# Patient Record
Sex: Male | Born: 1975 | Race: Black or African American | Hispanic: No | Marital: Single | Smoking: Never smoker
Health system: Southern US, Community
[De-identification: ages and names within clinical notes are randomized; demographics above are authoritative.]

## PROBLEM LIST (undated history)

## (undated) DIAGNOSIS — I509 Heart failure, unspecified: Secondary | ICD-10-CM

## (undated) DIAGNOSIS — I10 Essential (primary) hypertension: Secondary | ICD-10-CM

## (undated) DIAGNOSIS — E1121 Type 2 diabetes mellitus with diabetic nephropathy: Secondary | ICD-10-CM

## (undated) DIAGNOSIS — L870 Keratosis follicularis et parafollicularis in cutem penetrans: Secondary | ICD-10-CM

## (undated) DIAGNOSIS — N289 Disorder of kidney and ureter, unspecified: Secondary | ICD-10-CM

## (undated) DIAGNOSIS — K219 Gastro-esophageal reflux disease without esophagitis: Secondary | ICD-10-CM

## (undated) DIAGNOSIS — Z992 Dependence on renal dialysis: Secondary | ICD-10-CM

## (undated) DIAGNOSIS — E119 Type 2 diabetes mellitus without complications: Secondary | ICD-10-CM

## (undated) DIAGNOSIS — E11319 Type 2 diabetes mellitus with unspecified diabetic retinopathy without macular edema: Secondary | ICD-10-CM

## (undated) DIAGNOSIS — I2510 Atherosclerotic heart disease of native coronary artery without angina pectoris: Secondary | ICD-10-CM

## (undated) DIAGNOSIS — N2581 Secondary hyperparathyroidism of renal origin: Secondary | ICD-10-CM

## (undated) DIAGNOSIS — IMO0001 Reserved for inherently not codable concepts without codable children: Secondary | ICD-10-CM

## (undated) HISTORY — PX: EYE SURGERY: SHX253

---

## 2014-02-22 ENCOUNTER — Inpatient Hospital Stay: Payer: Self-pay | Admitting: Internal Medicine

## 2014-02-22 LAB — URINALYSIS, COMPLETE
BACTERIA: NONE SEEN
Bilirubin,UR: NEGATIVE
Blood: NEGATIVE
Ketone: NEGATIVE
LEUKOCYTE ESTERASE: NEGATIVE
Nitrite: NEGATIVE
PH: 8 (ref 4.5–8.0)
RBC,UR: 7 /HPF (ref 0–5)
SPECIFIC GRAVITY: 1.012 (ref 1.003–1.030)
SQUAMOUS EPITHELIAL: NONE SEEN
WBC UR: 4 /HPF (ref 0–5)

## 2014-02-22 LAB — TROPONIN I
TROPONIN-I: 0.04 ng/mL
Troponin-I: 0.22 ng/mL — ABNORMAL HIGH

## 2014-02-22 LAB — PROTIME-INR
INR: 1
Prothrombin Time: 13.4 secs (ref 11.5–14.7)

## 2014-02-22 LAB — APTT: ACTIVATED PTT: 40.2 s — AB (ref 23.6–35.9)

## 2014-02-22 LAB — CBC
HCT: 33.8 % — ABNORMAL LOW (ref 40.0–52.0)
HGB: 10.7 g/dL — ABNORMAL LOW (ref 13.0–18.0)
MCH: 27.6 pg (ref 26.0–34.0)
MCHC: 31.8 g/dL — ABNORMAL LOW (ref 32.0–36.0)
MCV: 87 fL (ref 80–100)
PLATELETS: 144 10*3/uL — AB (ref 150–440)
RBC: 3.89 10*6/uL — ABNORMAL LOW (ref 4.40–5.90)
RDW: 15.9 % — ABNORMAL HIGH (ref 11.5–14.5)
WBC: 7.9 10*3/uL (ref 3.8–10.6)

## 2014-02-22 LAB — COMPREHENSIVE METABOLIC PANEL
Albumin: 3.2 g/dL — ABNORMAL LOW (ref 3.4–5.0)
Alkaline Phosphatase: 73 U/L
Anion Gap: 3 — ABNORMAL LOW (ref 7–16)
BUN: 12 mg/dL (ref 7–18)
Bilirubin,Total: 0.8 mg/dL (ref 0.2–1.0)
CHLORIDE: 100 mmol/L (ref 98–107)
CREATININE: 7.49 mg/dL — AB (ref 0.60–1.30)
Calcium, Total: 7.4 mg/dL — ABNORMAL LOW (ref 8.5–10.1)
Co2: 32 mmol/L (ref 21–32)
EGFR (African American): 10 — ABNORMAL LOW
GFR CALC NON AF AMER: 9 — AB
Glucose: 189 mg/dL — ABNORMAL HIGH (ref 65–99)
OSMOLALITY: 275 (ref 275–301)
POTASSIUM: 2.9 mmol/L — AB (ref 3.5–5.1)
SGOT(AST): 27 U/L (ref 15–37)
SGPT (ALT): 17 U/L
Sodium: 135 mmol/L — ABNORMAL LOW (ref 136–145)
Total Protein: 7.3 g/dL (ref 6.4–8.2)

## 2014-02-22 LAB — DRUG SCREEN, URINE
Amphetamines, Ur Screen: NEGATIVE (ref ?–1000)
BENZODIAZEPINE, UR SCRN: NEGATIVE (ref ?–200)
Barbiturates, Ur Screen: NEGATIVE (ref ?–200)
CANNABINOID 50 NG, UR ~~LOC~~: NEGATIVE (ref ?–50)
COCAINE METABOLITE, UR ~~LOC~~: NEGATIVE (ref ?–300)
MDMA (ECSTASY) UR SCREEN: NEGATIVE (ref ?–500)
Methadone, Ur Screen: NEGATIVE (ref ?–300)
Opiate, Ur Screen: NEGATIVE (ref ?–300)
Phencyclidine (PCP) Ur S: NEGATIVE (ref ?–25)
TRICYCLIC, UR SCREEN: NEGATIVE (ref ?–1000)

## 2014-02-22 LAB — MAGNESIUM: MAGNESIUM: 1.8 mg/dL

## 2014-02-22 LAB — LIPASE, BLOOD: LIPASE: 25 U/L — AB (ref 73–393)

## 2014-02-23 ENCOUNTER — Ambulatory Visit: Payer: Self-pay | Admitting: Neurology

## 2014-02-23 LAB — CBC WITH DIFFERENTIAL/PLATELET
Basophil #: 0 10*3/uL (ref 0.0–0.1)
Basophil %: 0.2 %
EOS ABS: 0 10*3/uL (ref 0.0–0.7)
Eosinophil %: 0.1 %
HCT: 34.7 % — ABNORMAL LOW (ref 40.0–52.0)
HGB: 10.9 g/dL — AB (ref 13.0–18.0)
LYMPHS ABS: 0.4 10*3/uL — AB (ref 1.0–3.6)
Lymphocyte %: 5.7 %
MCH: 27.9 pg (ref 26.0–34.0)
MCHC: 31.6 g/dL — AB (ref 32.0–36.0)
MCV: 89 fL (ref 80–100)
MONOS PCT: 1.2 %
Monocyte #: 0.1 x10 3/mm — ABNORMAL LOW (ref 0.2–1.0)
Neutrophil #: 6.1 10*3/uL (ref 1.4–6.5)
Neutrophil %: 92.8 %
Platelet: 160 10*3/uL (ref 150–440)
RBC: 3.91 10*6/uL — AB (ref 4.40–5.90)
RDW: 16.1 % — ABNORMAL HIGH (ref 11.5–14.5)
WBC: 6.6 10*3/uL (ref 3.8–10.6)

## 2014-02-23 LAB — LIPID PANEL
Cholesterol: 93 mg/dL (ref 0–200)
HDL: 37 mg/dL — AB (ref 40–60)
Ldl Cholesterol, Calc: 41 mg/dL (ref 0–100)
Triglycerides: 73 mg/dL (ref 0–200)
VLDL Cholesterol, Calc: 15 mg/dL (ref 5–40)

## 2014-02-23 LAB — BASIC METABOLIC PANEL
Anion Gap: 9 (ref 7–16)
BUN: 23 mg/dL — AB (ref 7–18)
CO2: 28 mmol/L (ref 21–32)
Calcium, Total: 7.6 mg/dL — ABNORMAL LOW (ref 8.5–10.1)
Chloride: 94 mmol/L — ABNORMAL LOW (ref 98–107)
Creatinine: 9.55 mg/dL — ABNORMAL HIGH (ref 0.60–1.30)
EGFR (Non-African Amer.): 7 — ABNORMAL LOW
GFR CALC AF AMER: 8 — AB
Glucose: 590 mg/dL (ref 65–99)
Osmolality: 294 (ref 275–301)
POTASSIUM: 4.1 mmol/L (ref 3.5–5.1)
SODIUM: 131 mmol/L — AB (ref 136–145)

## 2014-02-23 LAB — TROPONIN I: TROPONIN-I: 0.2 ng/mL — AB

## 2014-02-23 LAB — CLOSTRIDIUM DIFFICILE(ARMC)

## 2014-02-24 LAB — BASIC METABOLIC PANEL
Anion Gap: 10 (ref 7–16)
BUN: 38 mg/dL — AB (ref 7–18)
CHLORIDE: 97 mmol/L — AB (ref 98–107)
Calcium, Total: 7.9 mg/dL — ABNORMAL LOW (ref 8.5–10.1)
Co2: 31 mmol/L (ref 21–32)
Creatinine: 12.04 mg/dL — ABNORMAL HIGH (ref 0.60–1.30)
EGFR (African American): 6 — ABNORMAL LOW
GFR CALC NON AF AMER: 5 — AB
Glucose: 231 mg/dL — ABNORMAL HIGH (ref 65–99)
Osmolality: 292 (ref 275–301)
Potassium: 3.5 mmol/L (ref 3.5–5.1)
SODIUM: 138 mmol/L (ref 136–145)

## 2014-02-24 LAB — WBCS, STOOL

## 2014-02-24 LAB — PHOSPHORUS: Phosphorus: 4.4 mg/dL (ref 2.5–4.9)

## 2014-02-25 LAB — STOOL CULTURE

## 2014-08-19 NOTE — Consult Note (Signed)
PATIENT NAME:  [name] [name], [name] [name] MR#:  [id number] DATE OF BIRTH:  July 20, 1975  DATE OF CONSULTATION:  02/23/2014  REFERRING PHYSICIAN:  Demetrios Loll, MD CONSULTING PHYSICIAN:  A. Lavone Orn, MD  CHIEF COMPLAINT: Uncontrolled diabetes.   HISTORY OF PRESENT ILLNESS: This is a 39 year old male with diabetes mellitus, hypertension, and end-stage renal disease on dialysis, admitted yesterday with chest pain and possible seizure. He was brought to the ED from his dialysis center and initially found to have hypertension with persistent chest pain, BP was initially in the 190/110 range. EKG showed no evidence of acute ST-T wave changes. Initial cardiac enzymes were negative. Nitroglycerin patch was placed and he was started on nicardipine drip for his hypertension. Initial glucose was 189. The patient was given Solu-Medrol 40 mg last evening and then developed severe hyperglycemia with blood sugars into the 300-500 range overnight. He has been treated with multiple injections of NovoLog, Levemir 6 units, as well as this morning was given NovoLog 70/30 mix 30 units, and blood sugars have gradually improved into the to 270 range.   He has had diabetes since 2001. Recalls he was taking oral medications initially and then switched to insulin about 7 years ago. He has been on his current regimen of NovoLog 70/30 mix 30-35 units at supper daily for the last 2 years. He recently relocated to the area from [place]. He has not yet established with a primary care physician. He does not check his blood sugars. He states he does have a glucometer and he has test strips, but simply dislikes the pain of fingerstick blood sugar tests. Diabetes is complicated by retinopathy with history of laser eye surgery as well as peripheral neuropathy in addition to the renal failure.   PAST MEDICAL HISTORY:  1.  Hypertension.  2.  End-stage renal disease, on dialysis.  3.  Diabetes mellitus.  4.  Diabetic retinopathy, status post laser eye  surgery.  5.  Diabetic peripheral neuropathy.   PAST SURGICAL HISTORY:  1.  Left arm fistula.  2.  Laser eye surgery.   ALLERGIES: LISINOPRIL.   SOCIAL HISTORY: The patient relocated from [place] in August 2015. Lives with his mother. Not employed. No tobacco or alcohol use.   FAMILY HISTORY: Positive for diabetes, hypertension and end-stage renal disease.   CURRENT MEDICATIONS:  1.  NovoLog insulin sliding scale.  2.  NovoLog 70/30 mix 30 units q.a.m.  3.  Norvasc 10 mg daily.  4.  Coreg 25 mg b.i.d.  5.  Lasix 40 mg daily.  6.  Hydralazine 25 mg q. 8 hours.  7.  Isor-Bid 60 mg daily.  8.  Cozaar 100 mg daily.  9.  Magnesium oxide 400 mg b.i.d.  10.  Aspirin 81 mg daily.   REVIEW OF SYSTEMS: GENERAL: Denies recent weight loss or weight gain. Denies fevers.  HEENT: Denies blurred vision. Denies sore throat.  CARDIOVASCULAR: Reports chest pain has resolved. Denies palpitations.  PULMONARY: Denies cough or shortness of breath.  ABDOMEN: Denies abdominal pain. Complains of abdominal distention, which he attributes to fluid. Denies nausea. Appetite has been good.  GENITOURINARY: Denies dysuria.  MUSCULOSKELETAL: Denies weakness. Denies swelling.  SKIN: Denies rash or recent skin changes.  ENDOCRINE: Denies heat or cold intolerance.  NEUROLOGIC: Denies falls, may have had a seizure yesterday.  HEMATOLOGIC: No easy bruising or bleeding.   PHYSICAL EXAMINATION:  VITAL SIGNS: Height 72.9 inches, weight 271 pounds, BMI 35.8. Temperature 98.1, pulse 89, respirations 18, blood pressure 162/101, pulse oximetry 96%  on room air.  GENERAL: This is an obese African American male in no acute distress.  HEENT: EOMI. Oropharynx is clear. Mucous membranes moist.  NECK: Supple. No thyromegaly.  CARDIAC: Regular rate and rhythm without murmur.  PULMONARY: Clear to auscultation bilaterally.  ABDOMEN: Distended, nontender.  EXTREMITIES: No peripheral edema is present.  SKIN: No rash or recent  skin changes. A bilateral barefoot examination shows no calluses or lesions. There are hyperpigmented striae, fine, on the left midabdomen.  NEUROLOGIC: Cranial nerves II-XII intact. No tremor.  PSYCHIATRIC: Calm, cooperative, alert.   LABORATORY DATA: Glucose 590, BUN 23, creatinine 9.5, sodium 131, chloride 94, EGFR 8, calcium 7.6. LDL cholesterol 41, total cholesterol 93, triglycerides 73. Hematocrit 34.7, WBC 6.6, platelets 160,000. Urine drug screen negative.   ASSESSMENT: A 39 year old male with multiple medical problems to include end-stage renal disease on dialysis, uncontrolled hypertension, uncontrolled diabetes, admitted after possible seizure, details not clear.   RECOMMENDATIONS:  1.  As he is most comfortable with ongoing use of 70/30 insulin, this is my insulin of choice. Typically, this insulin is dosed just prior to meals, twice daily. Can start with 10 units at breakfast meal and continue 30 units at supper meal. Expect blood sugars to improve as effects of high-dose steroid has worn off.  2.  Continue NovoLog insulin sliding scale.  3.  Advised the patient of the importance of self-monitoring of blood sugars. Advised him to check blood sugars before meals.  4.  Anticipate we will schedule outpatient followup in the next few weeks, once clinically improved and ready for discharge.   Thank you for the kind request for consultation. I will follow along with you.    ____________________________ A. Lavone Orn, MD ams:TT D: 02/23/2014 18:06:21 ET T: 02/23/2014 21:52:53 ET JOB#: [id number]  cc: A. Lavone Orn, MD, <Dictator> Sherlon Handing MD ELECTRONICALLY SIGNED 03/01/2014 13:06

## 2014-08-19 NOTE — Discharge Summary (Signed)
PATIENT NAME:  [name] [name], [name] [name] MR#:  [id number] DATE OF BIRTH:  [birth year]  DATE OF ADMISSION:  02/22/2014 DATE OF DISCHARGE:  02/24/2014  PRIMARY CARE PHYSICIAN: Non local.  CONSULTATIONS: Nephrology, Tama High, MD. Neurology, [name], MD.  Endocrinology, A. Lavone Orn, MD. Cardiology, Isaias Cowman, MD.   DISCHARGE DIAGNOSES:  1.  Malignant hypertension.  2.  Chest pain with elevated troponin, possibly due to demand ischemia.  3.  Diabetes with hypercholesterolemia.  4.  End-stage renal disease.   CONDITION: Stable.   CODE STATUS: Full Code.   HOME MEDICATIONS: Please refer to the medication reconciliation list.   DIET: Low sodium, low fat, low cholesterol, ADA diet.   ACTIVITY: As tolerated.   FOLLOWUP CARE: Follow up with PCP, Dr. Gabriel Carina, and Dr. Holley Raring within 1-2 weeks.   REASON FOR ADMISSION: Black out.   HOSPITAL COURSE: The patient is a 39 year old African American male with a history of ESRD and hypertension who developed dizziness, could not breathe, and had chest pain. He was yelling and screaming. His vision was in and out. He could not open his eyes, and then his vision came back.   For detailed history and physical examination, please refer to the admission note dictated by Dr. Loletha Grayer. The patient's blood pressure was high at 191/110 in the ED. The ED physician started a nicardipine drip.   DIAGNOSTIC DATA: CAT scan of the head was normal. Chest x-ray may reflect pulmonary venous hypertension. No cardiomyopathy.   BUN 12, creatinine 7.49; electrolytes were normal except potassium 2.9. WBC 7.9, hemoglobin 10.7, lipase 25.   ASSESSMENT AND PLAN:  1.  Malignant hypertension. The patient was treated with a nicardipine drip and was admitted to the CCU. The patient's blood pressure has been improved. The nicardipine drip was discontinued due to better controlled hypertension. The patient's home p.o. hypertension medications were resumed. In  addition, Imdur and hydralazine were added.  2.  Hypokalemia, improved.  3.  Questionable seizure, according to Dr. Irish Elders, and questionable seizure episode after dialysis. According to EEG, no seizure activity. Per Dr, Irish Elders, the patient does not need any further workup or seizure medications.  4.  Chest pain with elevated troponin, possibly due to demand ischemia, secondary to malignant hypertension. Dr. Saralyn Pilar evaluated the patient and suggested no further workup.  5.  Diabetes with hyperglycemia. The patient's blood sugar was once more than 500 yesterday. We requested an endocrinology consult; Dr. Elisabeth Cara suggested changing the patient's Humulin 70/30 of 30 units at bedtime to 30 units a.c. breakfast and 10 units a.c. supper. The patient needs to follow up with Dr. Elisabeth Cara as an outpatient.  6.  End-stage renal disease, the patient has been treated with hemodialysis. The patient has no complaints.   Vital signs are stable. He is clinically stable. He will be discharged to home today. I discussed the patient's discharge plan with the patient and nurse.   TIME SPENT: About 38 minutes.    ____________________________ Demetrios Loll, MD qc:MT D: 02/24/2014 14:28:06 ET T: 02/24/2014 16:12:10 ET JOB#: [id number]  cc: Demetrios Loll, MD, <Dictator> Demetrios Loll MD ELECTRONICALLY SIGNED 02/24/2014 17:38

## 2014-08-19 NOTE — Consult Note (Signed)
PATIENT NAME:  [name] [name], [name] [name] MR#:  [id number] DATE OF BIRTH:  1975/07/18  DATE OF CONSULTATION:  02/23/2014  REFERRING PHYSICIAN:  Demetrios Loll, MD CONSULTING PHYSICIAN:  Isaias Cowman, MD  PRIMARY CARE: [facility].  CHIEF COMPLAINT: " I don't know what happened."   HISTORY OF PRESENT ILLNESS: The patient is a 39 year old gentleman with known end-stage renal disease, on chronic hemodialysis, who was admitted on 02/22/2014 after an apparent syncopal-type episode. During dialysis the patient developed dizziness, shortness of breath, chest discomfort, apparently was yelling and screaming and had a syncopal episode. The patient was brought to [facility] Room where he was noted to be markedly hypertensive with a blood pressure of 191/110. EKG was nondiagnostic. The patient was admitted to the CCU with admission laboratories notable for borderline elevated troponin of 0.22 with a followup of 0.20. Today, the patient denies chest pain, blood pressure appears improved, currently 160/100.   PAST MEDICAL HISTORY:  1.  End-stage renal disease, on chronic hemodialysis for less than a year.  2.  Hypertension.  3.  Insulin-requiring diabetes.   MEDICATIONS: Amlodipine 5 mg daily, carvedilol 25 mg b.i.d., furosemide 40 mg b.i.d., losartan 100 mg daily, Humulin 70/30 thirty units at bedtime, Renvela 800 mg t.i.d.   SOCIAL HISTORY: The patient currently lives with his mother. He denies tobacco abuse.   FAMILY HISTORY: No immediate family history of coronary artery disease or myocardial infarction.   REVIEW OF SYSTEMS: CONSTITUTIONAL: No fever or chills.  EYES: No blurry vision.  EARS: No hearing loss.  RESPIRATORY: Shortness of breath as described above.  CARDIOVASCULAR: Mild chest discomfort as described above.  GASTROINTESTINAL: No nausea, vomiting or diarrhea.  GENITOURINARY: No dysuria or hematuria.  ENDOCRINE: No polyuria or polydipsia.  MUSCULOSKELETAL: No arthralgias or myalgias.   NEUROLOGICAL: The patient had an apparent episode yesterday, which the patient has no recollection, which comprised of apparent yelling and screaming with near syncope.   PHYSICAL EXAMINATION:  VITAL SIGNS: Blood pressure 162/101, pulse 89, respirations 18, temperature 98.1, pulse oximetry 96%.  HEENT: Pupils equal and reactive to light and accommodation.  NECK: Supple without thyromegaly.  LUNGS: Clear.  HEART: Normal JVP. Normal PMI. Regular rate and rhythm. Normal S1, S2. No appreciable gallop, murmur or rub.  ABDOMEN: Soft and nontender. Pulses were intact bilaterally.  MUSCULOSKELETAL: Normal muscle tone.  NEUROLOGIC: The patient is alert and oriented x 3. Motor and sensory both grossly intact.   IMPRESSION: A 39 year old gentleman with known end-stage renal disease, on chronic hemodialysis, who presents with markedly elevated blood pressure following an atypical syncopal episode, questionable seizure activity with nondiagnostic EKG and borderline elevated troponin, likely due to demand supply ischemia.   RECOMMENDATIONS:  1.  Agree with overall current therapy.  2.  Would defer full dose anticoagulation at this time.  3.  Review 2D echocardiogram.  4.  Would defer invasive cardiac evaluation such as cardiac catheterization.  5.  Further recommendations pending echocardiogram results.  6.  Convert nitroglycerin paste to Imdur.   ____________________________ Isaias Cowman, MD ap:TT D: 02/23/2014 16:39:50 ET T: 02/23/2014 21:25:16 ET JOB#: [id number]  cc: Isaias Cowman, MD, <Dictator> Isaias Cowman MD ELECTRONICALLY SIGNED 03/10/2014 8:39

## 2014-08-19 NOTE — H&P (Signed)
PATIENT NAME:  [name] [name], [name] [name] MR#:  [id number] DATE OF BIRTH:  10-Mar-1976  DATE OF ADMISSION:  02/22/2014  PRIMARY CARE PHYSICIAN:  Currently as [facility].   CHIEF COMPLAINT: Blacked out.   HISTORY OF PRESENT ILLNESS: This is a 39 year old man who was at dialysis, he developed dizziness. He could not breathe. He developed chest pain. He was yelling and screaming. He tried to scoot back in the chair. He blacked out. His vision was in and out. He could not open his eyes; then his vision came back. He could not hear the people talking but they were around him, but he could see them, but he could not hear them. He was told he had a seizure. No tongue bite or urination loss. The patient states that he has been having diarrhea for years, 3 episodes of diarrhea today, but no blood in it.   In the ER and he was found to have accelerated hypertension, blood pressure upon presentation included blood pressure 191/110. The patient is still currently having some chest pain. Currently feels okay even though blood pressure is still elevated. The ER physician gave a nitroglycerin patch. Hospitalist services were contacted for further evaluation.   PAST MEDICAL HISTORY: Hypertension, diabetes, end-stage renal disease for the past 8 to 9 months.   PAST SURGICAL HISTORY: Left arm fistula, eye surgery.   ALLERGIES: LISINOPRIL.   MEDICATIONS: Include amlodipine 5 mg daily, Coreg 25 mg twice a day, EMLA cream 3 times a week applied to affected area prior to dialysis; vitamin D 50,000 units 1 tablet twice a week; Lasix 40 mg twice a day, Humulin 70/30 of 30 units subcutaneous injection at bedtime; losartan 100 mg daily, Renvela  800 mg 3 times a day.   SOCIAL HISTORY: No smoking, no alcohol, no drug use, currently not working.   FAMILY HISTORY: Father with end-stage renal disease; mother is healthy; grandparents and aunts have hypertension, diabetes.   REVIEW OF SYSTEMS:  CONSTITUTIONAL: Positive for cold, no  fever or chills; positive for weight loss; positive for right leg numb, no weakness.   EYES: Did have visual changes today.  EARS AND NOSE AND MOUTH AND THROAT: No hearing loss, no sore throat, no difficulty swallowing.  CARDIOVASCULAR: Positive for chest pain, center of the chest.  RESPIRATORY: Positive for shortness of breath, no cough, no sputum, no hemoptysis.  GASTROINTESTINAL: Positive for abdominal pain. No nausea. No vomiting, positive for diarrhea 3 times, no bright red blood per rectum, no melena.  GENITOURINARY: No burning on urination or hematuria.  MUSCULOSKELETAL: No joint pain or muscle pain.  INTEGUMENT: No rashes or eruptions.  NEUROLOGIC: No fainting or blackouts besides today's episode.  PSYCHIATRIC: No anxiety or depression.  ENDOCRINE: No thyroid problems.  HEMATOLOGIC AND LYMPHATIC: No anemia, no easy bruising or bleeding.   PHYSICAL EXAMINATION:  VITAL SIGNS: Current included a temperature of 98.2, pulse 94, respirations 15, blood pressure 188/106; pulse oximetry 97% on room air.  GENERAL: No respiratory distress.  EYES: Conjunctivae and lids normal. Pupils equal, round, and reactive to light; extraocular muscles intact. No nystagmus.  EARS AND NOSE AND MOUTH: Tympanic membranes, no erythema; nasal mucosa, no erythema; lips and gums, no lesions.  THROAT: No erythema, no exudate seen.  NECK: No JVD. No bruits. No lymphadenopathy. No thyromegaly. No thyroid nodules palpated.  RESPIRATORY: Lungs clear to auscultation, no use of accessory muscles to breathe, no rhonchi, nodules or wheeze heard.  CARDIOVASCULAR: S1, S2 normal. No gallops, rubs, or murmurs heard, carotid  upstroke 2+ bilaterally, no bruits; he does have pedal pulses 2+ bilaterally, no edema of the lower extremity.  ABDOMEN: Soft, nontender, no organomegaly/splenomegaly, normoactive bowel sounds, no masses felt.  LYMPHATIC: No lymph nodes in the neck.  MUSCULOSKELETAL: No clubbing, edema or cyanosis; still has  the dialysis access in his left arm.  SKIN: No ulcers or lesions seen.  NEUROLOGIC: Cranial nerves II through XII grossly intact; deep tendon reflexes 1+ bilateral lower extremities.  PSYCHIATRIC: The patient is oriented to person, place, and time.   DIAGNOSTIC DATA: Urinalysis glucose greater than 500 mg/dL, protein greater than 500 mg/dL, urine toxicology negative; CT scan of the head normal; chest x-ray cephalization of blood flow may reflect pulmonary venous hypertension, no overt cardiomegaly, airway thickening present, could be bronchitis or reactive airway disease; glucose 189, BUN 12, creatinine 7.49, sodium 135, potassium 2.9, chloride 100, CO2 of 32, calcium 7.4; liver function tests normal range; white blood cell count 7.9, H and H 10.7 and 33.8; platelet count of 144, lipase 25, magnesium 1.8, INR 1.0; troponin negative; EKG normal sinus rhythm, 89 beats per minute, left atrial enlargement, left axis deviation, pulmonary disease pattern, incomplete right bundle branch block, q waves septally.   ASSESSMENT AND PLAN: 1.  Malignant hypertension. Blood pressure is still very elevated. We will start on nicardipine drip 5 mg per hour, keep systolic blood pressure around 160, restart oral medications tomorrow and coming off nicardipine drip.  2.  Hypokalemia. We will replace orally and IV x 1; hold Lasix, diarrhea may be contributing to the hypokalemia.  3.  Possible seizure episode at dialysis. We will get a neurology consultation, get an EEG, once off the nicardipine drip can consider a MRI of the brain.  4.  Chest pain. We will get serial cardiac enzymes; chest pain is reproducible to palpation over the chest; I will obtain an echocardiogram, give aspirin at this point, but I will give 1 dose of Solu-Medrol just in case this is reactive. Can consider a CAT scan of the chest but we will hold off at this point.  5.  Diarrhea. We will send off stool studies.  6.  Diabetes. We will put on low dose  Lantus and sliding scale.  7.  End-stage renal disease. I spoke with Dr. Holley Raring, who will have the dialysis nurse take the access out of his arm and consider dialysis likely tomorrow to finish off today's treatment.   TIME SPENT ON ADMISSION: Was 55 minutes.   The patient will be admitted to the critical care unit with the accelerated hypertension on nicardipine drip.    ____________________________ Tana Conch. Leslye Peer, MD rjw:nt D: 02/22/2014 17:55:00 ET T: 02/22/2014 18:48:25 ET JOB#: [id number]  cc: [facility] Najee Manninen J. Leslye Peer, MD, <Dictator>  Marisue Brooklyn MD ELECTRONICALLY SIGNED 02/27/2014 13:28

## 2014-08-19 NOTE — Consult Note (Signed)
Allergies:  Lisinopril: Cough  Assessment/Plan:  Assessment/Plan Pt seen in consult for uncontrolled diabetes, due in part to SoluMedrol given yesterday. Hx reviewed. pt was interviewed and examined  A/ DM2, uncontrolled Diabetic retinopathy Diabetic peripheral neuropathy ESRD on HD Uncontrolled HTN  P/ Continue 70/30 Mix - give 10 units AC brekafast and 30 units AC supper Check sugars qACHS Continue current NovoLog SSI Will follow with you and offer out-patient f/up   Electronic Signatures: Judi Cong (MD)  (Signed 29-Oct-15 14:26)  Authored: ALLERGIES, Assessment/Plan   Last Updated: 29-Oct-15 14:26 by Judi Cong (MD)

## 2014-08-19 NOTE — Consult Note (Signed)
PATIENT NAME:  [name] [name], [name] [name] MR#:  [id number] DATE OF BIRTH:  Nov 07, 1975  DATE OF CONSULTATION:  02/23/2014  REFERRING PHYSICIAN:   CONSULTING PHYSICIAN:  [name], MD  REASON FOR CONSULTATION: Altered mental status, rule out seizure activity.   HISTORY OF PRESENT ILLNESS: This is a 39 year old gentleman with past medical history of end-stage renal disease on dialysis, history of hypertension. History obtained from the chart and patient. The patient was in normal state in dialysis center about 15 minute prior to the episode that I will describe in a minute. The patient receive something he states his IV and 15 minutes later had an episode where he was dizzy, shortness of breath, chest pain. He states he was confused and started screaming. At that time, his blood pressure was elevated at 191/110. Currently, the patient is back to baseline, status post imaging, status post EEG.  PAST MEDICAL HISTORY: Diabetes, end-stage renal disease, hypertension. The patient has a left arm fistula and history of eye surgery.   SOCIAL HISTORY: No smoking. No alcohol. No drug use. Currently unemployed.   FAMILY HISTORY: Father has history of end-stage renal disease.   REVIEW OF SYSTEMS: Currently no shortness of breath. No dizziness. No chest pain. No abdominal pain. The patient denies any anxiety or depression.   LABORATORY DATA: Workup has been reviewed. The patient has episodes of hyperglycemia.   PHYSICAL EXAMINATION:  VITAL SIGNS: The patient's temperature is 98, pulse 88, respirations 18, blood pressure 170/102.  NEUROLOGIC: The patient is alert, awake, and oriented to time, place, location, reason why he is in the hospital. Speech appears to be fluent. Extraocular movements intact. Pupils round, reactive bilaterally. Facial sensation intact. Facial motor is intact. Tongue is midline. On motor strength examination: The patient has frequent giveaway weakness in the right upper extremity with  uncontrolled movements, stating that he cannot control. They are not rhythmic in nature. Sensation intact to light touch. Reflexes severely diminished throughout. Coordination: Finger-to-nose intact.   IMPRESSION: A 39 year old gentleman presented from dialysis center with episode of chest pain, shortness of breath, dizziness. Episode of short lived; patient is currently back to baseline. I believe this was likely an anxiety episode, questionable conversion disorder. I am not convinced this is a true seizure episode. EEG was reviewed. Normal awake EEG.   PLAN: I would not do any further imaging from a neurological standpoint. No need for antiepileptic medication. Would strongly consider transferring him out of the ICU. Glucose control as per primary team.   Thank you. It was a pleasure seeing this patient.    ____________________________ [name], MD yz:ts D: 02/23/2014 12:37:39 ET T: 02/23/2014 16:30:42 ET JOB#: [id number]  cc: [name], MD, <Dictator> [name] MD ELECTRONICALLY SIGNED 03/01/2014 12:30

## 2014-08-19 NOTE — Consult Note (Signed)
PATIENT NAME:  [name] [name], STILLS MR#:  [id number] DATE OF BIRTH:  29-May-1975  DATE OF CONSULTATION:  02/23/2014  REFERRING PHYSICIAN:   Tana Conch. Leslye Peer, MD CONSULTING PHYSICIAN:  Cera Rorke Lilian Kapur, MD  REASON FOR CONSULTATION:  Evaluation and management of end-stage renal disease in a hemodialysis patient.   HISTORY OF PRESENT ILLNESS: The patient is a very pleasant 39 year old African American male with past medical history of hypertension, long-standing diabetes mellitus, end-stage renal disease on hemodialysis for 8 to 9 months, secondary hyperparathyroidism, anemia of chronic kidney disease, left upper extremity AV fistula who presented to [facility] with alterations in mental status. He was undergoing dialysis at the [facility] yesterday. While there, he ended up developing dizziness. Apparently, he had very elevated blood pressure with a systolic blood pressure of 245 at the Dialysis Center. He reports that he began yelling out and could not fully concentrate. He was also having visual disturbance at that time. He was told that he may have possibly had a seizure. The patient unclear as to whether he had any loss of consciousness, however. Upon presentation here, his systolic blood pressure was noted as being 191. Blood pressure is under better control today with a blood pressure of 174/108. He is currently on amlodipine, Coreg and Losartan. The patient reports that his visual disturbance has now improved. He is not having any chest pains at this moment. He received a partial dialysis treatment yesterday. He has been referred for transplantation at [facility]; however, he has not gone to the orientation session yet.   PAST MEDICAL HISTORY: 1.  Hypertension.  2.  Diabetes mellitus, type 2.  3.  End-stage renal disease secondary to hypertension and diabetes mellitus per patient report.  4.  Anemia of chronic kidney disease.  5.  Secondary hyperparathyroidism.   6.  Left upper extremity arteriovenous fistula.   ALLERGIES: LISINOPRIL.   CURRENT INPATIENT MEDICATIONS: Include: 1.  Acetaminophen 650 mg p.o. every four hours p.r.n.  2.  Aspirin 81 mg daily.  3.  Sliding scale insulin.  4.  Magnesium oxide 400 mg p.o. b.i.d.  5.  Zofran 4 mg IV q.4 hours p.r.n.  6.  Renvela 800 mg p.o. t.i.d. with meals.  7.  Amlodipine 10 mg p.o. daily.  8.  Coreg 25 mg p.o. b.i.d.  9.  Lasix 40 mg p.o. daily.  10. Insulin 70/30, 30 units at supper.  11. Insulin 70/30, 10 units at breakfast.  12. Imdur 30 mg p.o. daily.  13. Losartan 100 mg p.o. daily.   SOCIAL HISTORY: The patient is originally from [place] and recently moved to [place]. He is unmarried. He has one child. He denies tobacco, alcohol, or illicit drug use. Not currently employed.   FAMILY HISTORY: The patient's father has end-stage renal disease and also has history of diabetes mellitus. The patient's mother is alive and healthy.   REVIEW OF SYSTEMS:   CONSTITUTIONAL: The patient denies fevers, chills, weight loss.  EYES: Had some visual disturbance while he was at the dialysis center but that has now improved.  HENT: Denies headaches, hearing loss. Denies epistaxis or sore throat.  CARDIOVASCULAR: Currently denies chest pain, but did have some chest pain yesterday. Denies palpitations or orthopnea.  RESPIRATORY: Denies cough, shortness of breath, or hemoptysis.  GASTROINTESTINAL: Denies nausea, vomiting, or bloody stools.  GENITOURINARY: Denies frequency, urgency, or dysuria.  MUSCULOSKELETAL: Denies joint pain, swelling or redness.  INTEGUMENTARY: Denies skin rashes or lesions.  NEUROLOGIC: Denies  focal weakness, possible reported seizure activity yesterday.  PSYCHIATRIC: Denies depression, bipolar disorder.  ENDOCRINE: Denies polyuria, polydipsia or polyphagia.  HEMATOLOGIC AND LYMPHATIC: Denies easy bruisability, bleeding, or swollen lymph nodes. Has history of anemia of chronic  kidney disease.  ALLERGY AND IMMUNOLOGIC: Denies seasonal allergies or history of immunodeficiency.   PHYSICAL EXAMINATION: VITAL SIGNS: Temperature 98, pulse 90, respirations 19, blood pressure 174/108, pulse oximetry 98%.  GENERAL: Well-developed, well-nourished African American male who appears his stated age, currently in no acute distress.  HEENT: Normocephalic, atraumatic. Extraocular movements are intact. Pupils equal, round and reactive to light. No scleral icterus. Conjunctivae are pink. No epistaxis noted. Gross hearing intact. Oral mucosa moist.  NECK: Supple without JVD or lymphadenopathy.  LUNGS: Clear to auscultation bilaterally with normal respiratory effort.  CARDIOVASCULAR: S1, S2 regular rate and rhythm. No murmurs, rubs, or gallops appreciated.  ABDOMEN: Obese, soft, nontender, nondistended. Bowel sounds positive. No rebound or guarding. No gross organomegaly appreciated.  EXTREMITIES: No clubbing, cyanosis noted. Trace bilateral lower extremity edema noted.  NEUROLOGIC: The patient is awake, alert, and oriented to time, person, and place. Strength is 5/5 in both upper and lower extremities.  SKIN: Warm and dry. No rashes noted.  MUSCULOSKELETAL: No joint redness, swelling or tenderness appreciated.  GENITOURINARY: No suprapubic tenderness is noted at this time.  PSYCHIATRIC: The patient with appropriate affect and appears to have good insight into his current illness.   LABORATORY DATA: Sodium 131, potassium 4.1, chloride 94, CO2 28, BUN 23, creatinine 9.55, glucose 590, calcium of 7.6, total protein 7.3, albumin 3.2, total bilirubin 0.8, alkaline phosphatase 73, AST 27, ALT 17. Troponin 0.20. Urine drug screen negative. CBC shows WBCs 6.6, hemoglobin 10.9, hematocrit 34, platelets 160, INR 1.0. PT is 1.4. Urinalysis shows urine glucose greater than 500 mg/dL, and urine protein greater than 500 mg/dL.   IMPRESSION: This is a 39 year old African American male with a past  medical history of hypertension, long-standing diabetes mellitus, end-stage renal disease on hemodialysis Monday, Wednesday, Friday, followed at the [facility] by [facility] nephrology, anemia of chronic kidney disease, secondary hypoparathyroidism, left upper extremity arteriovenous fistula who presented to [facility] with malignant hypertension.   1.  End-stage renal disease on hemodialysis. The patient has been on dialysis for the past 8 to 9 months. He has been on dialysis here in [facility] for the past 2 months and is followed by [facility] nephrology as an outpatient. No urgent indication for dialysis today. We will plan for dialysis tomorrow a.m. Goal ultrafiltration will be 2.5 kg. He has been referred for transplantation orientation as an outpatient.  2.  Hypertension. Blood pressure still quite elevated. He is currently maintained on amlodipine, Coreg, and Losartan. We will add hydralazine 25 mg p.o. t.i.d. for blood pressure control. May need to consider clonidine as well. 3.  Anemia of chronic kidney disease. Hemoglobin currently 10.9. We will hold off on any Epogen for now, given elevated blood pressure.  4. Secondary hypoparathyroidism. Continue Renvela for now. Followup PTH and phosphorus today.  5.  I would like to thank Dr. Leslye Peer for this kind referral. Further plan as the patient progresses.  ____________________________ Tama High, MD mnl:jp D: 02/23/2014 15:18:57 ET T: 02/23/2014 15:38:15 ET JOB#: [id number]  cc: Tama High, MD, <Dictator> Tama High MD ELECTRONICALLY SIGNED 03/02/2014 10:55

## 2014-11-02 DIAGNOSIS — E1122 Type 2 diabetes mellitus with diabetic chronic kidney disease: Secondary | ICD-10-CM | POA: Insufficient documentation

## 2014-11-02 DIAGNOSIS — N186 End stage renal disease: Secondary | ICD-10-CM | POA: Insufficient documentation

## 2015-04-27 ENCOUNTER — Encounter: Payer: Self-pay | Admitting: Emergency Medicine

## 2015-04-27 ENCOUNTER — Emergency Department
Admission: EM | Admit: 2015-04-27 | Discharge: 2015-04-27 | Disposition: A | Discharge status: Home / Self Care | Payer: Medicare Other | Attending: Emergency Medicine | Admitting: Emergency Medicine

## 2015-04-27 DIAGNOSIS — H538 Other visual disturbances: Secondary | ICD-10-CM | POA: Insufficient documentation

## 2015-04-27 DIAGNOSIS — H5712 Ocular pain, left eye: Secondary | ICD-10-CM | POA: Diagnosis not present

## 2015-04-27 DIAGNOSIS — I10 Essential (primary) hypertension: Secondary | ICD-10-CM | POA: Insufficient documentation

## 2015-04-27 DIAGNOSIS — E119 Type 2 diabetes mellitus without complications: Secondary | ICD-10-CM | POA: Diagnosis not present

## 2015-04-27 HISTORY — DX: Essential (primary) hypertension: I10

## 2015-04-27 HISTORY — DX: Type 2 diabetes mellitus without complications: E11.9

## 2015-04-27 HISTORY — DX: Disorder of kidney and ureter, unspecified: N28.9

## 2015-04-27 MED ORDER — TETRACAINE HCL 0.5 % OP SOLN
2.0000 [drp] | Freq: Once | OPHTHALMIC | Status: DC
Start: 2015-04-27 — End: 2015-04-27
  Filled 2015-04-27: qty 2

## 2015-04-27 MED ORDER — PHENYLEPHRINE HCL 2.5 % OP SOLN
1.0000 [drp] | Freq: Once | OPHTHALMIC | Status: DC
  Filled 2015-04-27: qty 2

## 2015-04-27 NOTE — ED Provider Notes (Signed)
[facility] Emergency Department Provider Note ?  ? ____________________________________________ ? Time seen: 4:39 PM ? I have reviewed the triage vital signs and the nursing notes.  ________ HISTORY ? Chief Complaint Eye Pain     HPI  [name] [name] is a 39 y.o. male   presents emergency Department with left eye pain and visual acuity changes 2 days. Patient states that he has an appointment at [facility] next week but states that symptoms have continued to increase. Patient states that he had a "eye problem" that was either hyphema related or possible retinal detachment to the right eye. 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 Patient states that his left eye she'll acuity is best described as a hazy sensation. He states that there is pain to the eye as well. Patient denies any headaches. Patient is not taking any medications prior to arrival. ? ? ? Past Medical History  Diagnosis Date  . Hypertension   . Renal disorder   . Diabetes mellitus without complication ([place])     There are no active problems to display for this patient.  ? History reviewed. No pertinent past surgical history. ? No current outpatient prescriptions on file. ? Allergies Review of patient's allergies indicates no known allergies. ? No family history on file. ? Social History Social History  Substance Use Topics  . Smoking status: Never Smoker   . Smokeless tobacco: None  . Alcohol Use: No   ? Review of Systems Constitutional: no fever. Eyes: no discharge. Left eye pain and left eyes visual acuity changes. ENT: no sore throat. Cardiovascular: no chest pain. Respiratory: no cough. No sob Gastrointestinal: denies abdominal pain, vomiting, diarrhea, and constipation Genitourinary: no dysuria. Negative for hematuria Musculoskeletal: Negative for back pain. Skin: Negative for rash. Neurological: Negative for  headaches  10-point ROS otherwise negative.  _______________ PHYSICAL EXAM: ? VITAL SIGNS:   ED Triage Vitals  Enc Vitals Group     BP 04/27/15 1406 115/75 mmHg     Pulse Rate 04/27/15 1406 95     Resp 04/27/15 1406 18     Temp 04/27/15 1406 98.9 F (37.2 C)     Temp Source 04/27/15 1406 Oral     SpO2 04/27/15 1406 98 %     Weight 04/27/15 1406 220 lb (99.791 kg)     Height 04/27/15 1406 6\' 1"  (1.854 m)     Head Cir --      Peak Flow --      Pain Score 04/27/15 1426 0     Pain Loc --      Pain Edu? --      Excl. in [place]? --    ?  Constitutional: Alert and oriented. Well appearing and in no distress. Eyes: Conjunctivae are normal. No visible deformity to eyelids. He was are slightly constricted but to respond to light. Extraocular eye motion is intact. Funduscopic exam to the right eye reveals red reflex, vasculature but optic nerve is not well visualized. There is no red reflex, and no visual past culture or optic nerve to left eye. Visual acuity testing by provider reveals there is a distinct decrease in vision to left eye. Tono-Pen exam reveals increased intraocular pressures on both right and left side. 4 readings on right are as follows: 41, 38, 36, 40. Readings on left are as follows: 38, 29, 32, 47. ENT      Head: Normocephalic and atraumatic.  Ears:       Nose: No congestion/rhinnorhea.      Mouth/Throat: Mucous membranes are moist.      Hematological/Lymphatic/Immunilogical: No cervical lymphadenopathy. Cardiovascular: Normal rate, regular rhythm.  Respiratory: Normal respiratory effort without tachypnea nor retractions. Gastrointestinal: Soft and nontender. No distention. There is no CVA tenderness. Genitourinary:  Musculoskeletal: Nontender with normal range of motion in all extremities.  Neurologic:  Normal speech and language. No gross focal neurologic deficits are appreciated. Skin:  Skin is warm, dry and intact. No rash noted. Psychiatric: Mood and affect  are normal. Speech and behavior are normal. Patient exhibits appropriate insight and judgment.    ___________ RADIOLOGY    _____________ PROCEDURES ? Procedure(s) performed:    Medications  tetracaine (PONTOCAINE) 0.5 % ophthalmic solution 2 drop (not administered)  phenylephrine (MYDFRIN) 2.5 % ophthalmic solution 1 drop (not administered)    ______________________________________________________ INITIAL IMPRESSION / ASSESSMENT AND PLAN / ED COURSE ? Pertinent labs & imaging results that were available during my care of the patient were reviewed by me and considered in my medical decision making (see chart for details).    Patient presents emergency department complaining of left eye pain and decreased visual acuity out of same eye. Funduscopic exam reveals no red reflex, and no visible structures. Tono-Pen reading reveals an increased pressure readings to both eyes. On-call ophthalmology was consulted and Dr. Charlann Boxer advised that patient is to come to her office directly for further evaluation. At this point time patient's diagnosis is left eye pain. He is given instructions to follow up with ophthalmology immediately upon discharge.    New Prescriptions   No medications on file   ____________________________________________ FINAL CLINICAL IMPRESSION(S) / ED DIAGNOSES?  Final diagnoses:  Acute left eye pain       Darletta Moll, PA-C 04/27/15 1639  Nance Pear, MD 04/27/15 [phone]

## 2015-04-27 NOTE — ED Notes (Signed)
Pt presents with left eye pain and problems with vision started about two days ago. Pt is on dialysis and had appt sch next week but states is getting worse and does not want to wait.

## 2015-05-07 ENCOUNTER — Emergency Department
Admission: EM | Admit: 2015-05-07 | Discharge: 2015-05-07 | Disposition: A | Discharge status: Home / Self Care | Payer: Medicare Other | Attending: Emergency Medicine | Admitting: Emergency Medicine

## 2015-05-07 ENCOUNTER — Ambulatory Visit
Admission: AD | Admit: 2015-05-07 | Discharge: 2015-05-07 | Disposition: A | Discharge status: Home / Self Care | Payer: Medicare Other | Source: Ambulatory Visit | Attending: Vascular Surgery | Admitting: Vascular Surgery

## 2015-05-07 ENCOUNTER — Emergency Department: Payer: Medicare Other

## 2015-05-07 ENCOUNTER — Encounter: Payer: Self-pay | Admitting: *Deleted

## 2015-05-07 ENCOUNTER — Encounter: Payer: Self-pay | Admitting: Emergency Medicine

## 2015-05-07 ENCOUNTER — Encounter
Admission: AD | Disposition: A | Discharge status: Home / Self Care | Payer: Self-pay | Source: Ambulatory Visit | Attending: Vascular Surgery

## 2015-05-07 DIAGNOSIS — Z79899 Other long term (current) drug therapy: Secondary | ICD-10-CM | POA: Diagnosis not present

## 2015-05-07 DIAGNOSIS — Y832 Surgical operation with anastomosis, bypass or graft as the cause of abnormal reaction of the patient, or of later complication, without mention of misadventure at the time of the procedure: Secondary | ICD-10-CM | POA: Diagnosis not present

## 2015-05-07 DIAGNOSIS — E119 Type 2 diabetes mellitus without complications: Secondary | ICD-10-CM | POA: Insufficient documentation

## 2015-05-07 DIAGNOSIS — T82858A Stenosis of vascular prosthetic devices, implants and grafts, initial encounter: Secondary | ICD-10-CM | POA: Insufficient documentation

## 2015-05-07 DIAGNOSIS — E1122 Type 2 diabetes mellitus with diabetic chronic kidney disease: Secondary | ICD-10-CM | POA: Insufficient documentation

## 2015-05-07 DIAGNOSIS — Y658 Other specified misadventures during surgical and medical care: Secondary | ICD-10-CM | POA: Insufficient documentation

## 2015-05-07 DIAGNOSIS — Z7901 Long term (current) use of anticoagulants: Secondary | ICD-10-CM | POA: Diagnosis not present

## 2015-05-07 DIAGNOSIS — I120 Hypertensive chronic kidney disease with stage 5 chronic kidney disease or end stage renal disease: Secondary | ICD-10-CM | POA: Insufficient documentation

## 2015-05-07 DIAGNOSIS — I10 Essential (primary) hypertension: Secondary | ICD-10-CM | POA: Diagnosis not present

## 2015-05-07 DIAGNOSIS — N186 End stage renal disease: Secondary | ICD-10-CM | POA: Insufficient documentation

## 2015-05-07 DIAGNOSIS — Z992 Dependence on renal dialysis: Secondary | ICD-10-CM | POA: Insufficient documentation

## 2015-05-07 HISTORY — PX: PERIPHERAL VASCULAR CATHETERIZATION: SHX172C

## 2015-05-07 LAB — COMPREHENSIVE METABOLIC PANEL
ALBUMIN: 4 g/dL (ref 3.5–5.0)
ALK PHOS: 77 U/L (ref 38–126)
ALT: 13 U/L — AB (ref 17–63)
ANION GAP: 16 — AB (ref 5–15)
AST: 14 U/L — ABNORMAL LOW (ref 15–41)
BUN: 75 mg/dL — ABNORMAL HIGH (ref 6–20)
CALCIUM: 7.7 mg/dL — AB (ref 8.9–10.3)
CHLORIDE: 100 mmol/L — AB (ref 101–111)
CO2: 22 mmol/L (ref 22–32)
CREATININE: 17.55 mg/dL — AB (ref 0.61–1.24)
GFR calc Af Amer: 3 mL/min — ABNORMAL LOW (ref >60)
GFR calc non Af Amer: 3 mL/min — ABNORMAL LOW (ref >60)
GLUCOSE: 144 mg/dL — AB (ref 65–99)
Potassium: 4.2 mmol/L (ref 3.5–5.1)
SODIUM: 138 mmol/L (ref 135–145)
TOTAL PROTEIN: 7.9 g/dL (ref 6.5–8.1)
Total Bilirubin: 0.5 mg/dL (ref 0.3–1.2)

## 2015-05-07 LAB — CBC
HCT: 34.8 % — ABNORMAL LOW (ref 40.0–52.0)
HEMOGLOBIN: 11.4 g/dL — AB (ref 13.0–18.0)
MCH: 27.6 pg (ref 26.0–34.0)
MCHC: 32.8 g/dL (ref 32.0–36.0)
MCV: 83.9 fL (ref 80.0–100.0)
PLATELETS: 198 10*3/uL (ref 150–440)
RBC: 4.14 MIL/uL — AB (ref 4.40–5.90)
RDW: 15.4 % — ABNORMAL HIGH (ref 11.5–14.5)
WBC: 9.8 10*3/uL (ref 3.8–10.6)

## 2015-05-07 LAB — GLUCOSE, CAPILLARY: Glucose-Capillary: 122 mg/dL — ABNORMAL HIGH (ref 65–99)

## 2015-05-07 LAB — PROTIME-INR
INR: 1.04
PROTHROMBIN TIME: 13.8 s (ref 11.4–15.0)

## 2015-05-07 LAB — APTT: aPTT: 33 seconds (ref 24–36)

## 2015-05-07 SURGERY — A/V SHUNTOGRAM/FISTULAGRAM
Anesthesia: Moderate Sedation

## 2015-05-07 MED ORDER — FENTANYL CITRATE (PF) 100 MCG/2ML IJ SOLN
INTRAMUSCULAR | Status: DC | PRN
  Administered 2015-05-07 (×4): 50 ug via INTRAVENOUS

## 2015-05-07 MED ORDER — FAMOTIDINE 20 MG PO TABS
ORAL_TABLET | ORAL | Status: AC
  Administered 2015-05-07: 20 mg via ORAL
  Filled 2015-05-07: qty 1

## 2015-05-07 MED ORDER — MIDAZOLAM HCL 5 MG/ML IJ SOLN
2.0000 mg | Freq: Once | INTRAMUSCULAR | Status: AC
  Administered 2015-05-07: 2 mg via INTRAVENOUS
  Filled 2015-05-07: qty 1

## 2015-05-07 MED ORDER — MIDAZOLAM HCL 5 MG/5ML IJ SOLN
INTRAMUSCULAR | Status: AC
  Filled 2015-05-07: qty 5

## 2015-05-07 MED ORDER — METHYLPREDNISOLONE SODIUM SUCC 125 MG IJ SOLR
125.0000 mg | Freq: Once | INTRAMUSCULAR | Status: AC
  Administered 2015-05-07: 125 mg via INTRAVENOUS

## 2015-05-07 MED ORDER — MIDAZOLAM HCL 5 MG/ML IJ SOLN
INTRAMUSCULAR | Status: DC | PRN
  Administered 2015-05-07: 1 mg via INTRAVENOUS
  Administered 2015-05-07: 2 mg via INTRAVENOUS
  Administered 2015-05-07 (×2): 1 mg via INTRAVENOUS

## 2015-05-07 MED ORDER — IOHEXOL 300 MG/ML  SOLN
INTRAMUSCULAR | Status: DC | PRN
  Administered 2015-05-07: 25 mL via INTRAVENOUS

## 2015-05-07 MED ORDER — METHYLPREDNISOLONE SODIUM SUCC 125 MG IJ SOLR
INTRAMUSCULAR | Status: AC
  Administered 2015-05-07: 125 mg via INTRAVENOUS
  Filled 2015-05-07: qty 2

## 2015-05-07 MED ORDER — FAMOTIDINE 20 MG PO TABS
20.0000 mg | ORAL_TABLET | Freq: Once | ORAL | Status: AC
  Administered 2015-05-07: 20 mg via ORAL

## 2015-05-07 MED ORDER — MIDAZOLAM HCL 5 MG/5ML IJ SOLN
INTRAMUSCULAR | Status: AC
  Filled 2015-05-07: qty 10

## 2015-05-07 MED ORDER — FENTANYL CITRATE (PF) 100 MCG/2ML IJ SOLN
INTRAMUSCULAR | Status: AC
  Filled 2015-05-07: qty 2

## 2015-05-07 MED ORDER — MIDAZOLAM HCL 2 MG/2ML IJ SOLN
INTRAMUSCULAR | Status: AC
  Filled 2015-05-07: qty 2

## 2015-05-07 MED ORDER — HEPARIN SODIUM (PORCINE) 1000 UNIT/ML IJ SOLN
INTRAMUSCULAR | Status: AC
  Filled 2015-05-07: qty 1

## 2015-05-07 MED ORDER — FENTANYL CITRATE (PF) 100 MCG/2ML IJ SOLN
INTRAMUSCULAR | Status: AC
  Filled 2015-05-07: qty 4

## 2015-05-07 SURGICAL SUPPLY — 5 items
CANNULA 5F STIFF (CANNULA) ×4 IMPLANT
DRAPE BRACHIAL (DRAPES) ×4 IMPLANT
PACK ANGIOGRAPHY (CUSTOM PROCEDURE TRAY) ×4 IMPLANT
SHEATH BRITE TIP 6FRX5.5 (SHEATH) ×4 IMPLANT
TOWEL OR 17X26 4PK STRL BLUE (TOWEL DISPOSABLE) ×4 IMPLANT

## 2015-05-07 NOTE — ED Notes (Signed)
Patient transported to X-ray 

## 2015-05-07 NOTE — Op Note (Signed)
OPERATIVE NOTE   PROCEDURE: 1. Contrast injection left arm brachiocephalic fistula  PRE-OPERATIVE DIAGNOSIS: Complication of dialysis access                                                       End Stage Renal Disease  POST-OPERATIVE DIAGNOSIS: same as above   SURGEON: Katha Cabal, M.D.  ANESTHESIA: Conscious sedation was administered under my direct supervision. IV Versed plus fentanyl were utilized. Continuous ECG, pulse oximetry and blood pressure was monitored throughout the entire procedure. A total of 5 milligrams of Versed and 200  micrograms of fentanyl were utilized.  Conscious sedation was begun at  1443 and concluded at 1515 for a total of 32 minutes.  ESTIMATED BLOOD LOSS: minimal  FINDING(S): 1. Widely patent stents in position  SPECIMEN(S):  None  CONTRAST: 25 cc  FLUOROSCOPY TIME: 0.1 minutes  INDICATIONS: Kyel Claxton is a 40 y.o. male who  presents with malfunctioning left arm AV access.  The patient is scheduled for angiography with possible intervention of the AV access.  The patient is aware the risks include but are not limited to: bleeding, infection, thrombosis of the cannulated access, and possible anaphylactic reaction to the contrast.  The patient acknowledges if the access can not be salvaged a tunneled catheter will be needed and will be placed during this procedure.  The patient is aware of the risks of the procedure and elects to proceed with the angiogram and intervention.  DESCRIPTION: After full informed written consent was obtained, the patient was brought back to the Special Procedure suite and placed supine position.  Appropriate cardiopulmonary monitors were placed.  The left arm was prepped and draped in the standard fashion.  Appropriate timeout is called. The left brachiocephalic fistula  was cannulated with a micropuncture needle.  The microwire was advanced and the needle was exchanged for  a microsheath.  The J-wire was then advanced  and a 6 Fr sheath inserted.  Hand injections were completed to image the access from the arterial anastomosis through the entire access.  The central venous structures were also imaged by hand injections.  Based on the images,  there are 2 stents present one at the cephalic subclavian confluence 1 at the level of the deltoid within the cephalic vein both of these are widely patent. The fistula itself is rather large measuring 18-20 mm in diameter it is somewhat tortuous. Arterial anastomosis is widely patent as is the visualized segment of the brachial artery. Central veins are widely patent..    A 4-0 Monocryl purse-string suture was sewn around the sheath.  The sheath was removed and light pressure was applied.  A sterile bandage was applied to the puncture site.    COMPLICATIONS: None  CONDITION: Carlynn Purl, M.D [facility] Office: [phone]  05/07/2015 3:36 PM

## 2015-05-07 NOTE — ED Notes (Signed)
Pt to ed with from [facility] dialysis center with c/o clotted dialysis port. States he was stuck 4 times today but they were unable to access it.

## 2015-05-07 NOTE — H&P (Signed)
[facility] Admission History & Physical  MRN : [id number]  [name] [name] is a 40 y.o. (1975/04/29) male who presents with chief complaint of access unable to use AV access secondary to low flow.  History of Present Illness: The patient was sent directly to the emergency room by his dialysis center earlier today secondary to inability to cannulate and reported low flow. Of note the patient was at [facility] last week where angioplasty and stent placement within his fistula was performed. Patient was told that everything was successful in that his fistula should be in good working order.  Patient denies hand pain, no fever chills. He has missed one dialysis session  Current Facility-Administered Medications  Medication Dose Route Frequency Provider Last Rate Last Dose  . midazolam (VERSED) 2 MG/2ML injection             Past Medical History  Diagnosis Date  . Hypertension   . Renal disorder   . Diabetes mellitus without complication ([place])     History reviewed. No pertinent past surgical history.  Social History Social History  Substance Use Topics  . Smoking status: Never Smoker   . Smokeless tobacco: None  . Alcohol Use: No    Family History History reviewed. No pertinent family history. no family history of porphyria bleeding or clotting disorders  Allergies  Allergen Reactions  . Lisinopril Other (See Comments)     REVIEW OF SYSTEMS (Negative unless checked)  Constitutional: [] Weight loss  [] Fever  [] Chills Cardiac: [] Chest pain   [] Chest pressure   [] Palpitations   [] Shortness of breath when laying flat   [] Shortness of breath at rest   [x] Shortness of breath with exertion. Vascular:  [] Pain in legs with walking   [] Pain in legs at rest   [] Pain in legs when laying flat   [] Claudication   [] Pain in feet when walking  [] Pain in feet at rest  [] Pain in feet when laying flat   [] History of DVT   [] Phlebitis   [] Swelling in legs   [] Varicose veins    [] Non-healing ulcers Pulmonary:   [] Uses home oxygen   [] Productive cough   [] Hemoptysis   [] Wheeze  [] COPD   [] Asthma Neurologic:  [] Dizziness  [] Blackouts   [] Seizures   [] History of stroke   [] History of TIA  [] Aphasia   [] Temporary blindness   [] Dysphagia   [] Weakness or numbness in arms   [] Weakness or numbness in legs Musculoskeletal:  [] Arthritis   [] Joint swelling   [] Joint pain   [] Low back pain Hematologic:  [] Easy bruising  [] Easy bleeding   [] Hypercoagulable state   [] Anemic  [] Hepatitis Gastrointestinal:  [] Blood in stool   [] Vomiting blood  [] Gastroesophageal reflux/heartburn   [] Difficulty swallowing. Genitourinary:  [] Chronic kidney disease   [] Difficult urination  [] Frequent urination  [] Burning with urination   [] Blood in urine Skin:  [] Rashes   [] Ulcers   [] Wounds Psychological:  [] History of anxiety   []  History of major depression.  Physical Examination  Filed Vitals:   05/07/15 1215  BP: 157/79  Pulse: 92  Resp: 18  Height: 6\' 1"  (1.854 m)  Weight: 125.646 kg (277 lb)  SpO2: 99%   Body mass index is 36.55 kg/(m^2). Gen: WD/WN, NAD Head: [place]/AT, No temporalis wasting.  Ear/Nose/Throat: Hearing grossly intact, nares w/o erythema or drainage, oropharynx w/o Erythema/Exudate, Eyes: PERRLA, EOMI.  Neck: Supple, no nuchal rigidity.  No bruit or JVD.  Pulmonary:  Good air movement, clear to auscultation bilaterally, no increased work of  respiration or use of accessory muscles  Cardiac: RRR, normal S1, S2, no Murmurs, rubs or gallops. Vascular: AV fistula with good thrill and continuous bruit.  Dressings in place secondary to multiple attempts at access today  Vessel Right Left  Radial Palpable Palpable  Ulnar Palpable Palpable  Brachial Palpable Palpable  Carotid Palpable, without bruit Palpable, without bruit  Aorta Not palpable N/A  Femoral Palpable Palpable  Popliteal Palpable Palpable  PT Palpable Palpable  DP Palpable Palpable   Gastrointestinal: soft,  non-tender/non-distended. No guarding/reflex. No masses, surgical incisions, or scars. Musculoskeletal: M/S 5/5 throughout.  No deformity or atrophy. Neurologic: CN 2-12 intact. Pain and light touch intact in extremities.  Symmetrical.  Speech is fluent. Motor exam as listed above. Psychiatric: Judgment intact, Mood & affect appropriate for pt's clinical situation. Dermatologic: No rashes or ulcers noted.  No cellulitis or open wounds. Lymph : No Cervical, Axillary, or Inguinal lymphadenopathy.   CBC Lab Results  Component Value Date   WBC 9.8 05/07/2015   HGB 11.4* 05/07/2015   HCT 34.8* 05/07/2015   MCV 83.9 05/07/2015   PLT 198 05/07/2015    BMET    Component Value Date/Time   NA 138 05/07/2015 0937   NA 138 02/24/2014 0359   K 4.2 05/07/2015 0937   K 3.5 02/24/2014 0359   CL 100* 05/07/2015 0937   CL 97* 02/24/2014 0359   CO2 22 05/07/2015 0937   CO2 31 02/24/2014 0359   GLUCOSE 144* 05/07/2015 0937   GLUCOSE 231* 02/24/2014 0359   BUN 75* 05/07/2015 0937   BUN 38* 02/24/2014 0359   CREATININE 17.55* 05/07/2015 0937   CREATININE 12.04* 02/24/2014 0359   CALCIUM 7.7* 05/07/2015 0937   CALCIUM 7.9* 02/24/2014 0359   GFRNONAA 3* 05/07/2015 0937   GFRNONAA 5* 02/24/2014 0359   GFRAA 3* 05/07/2015 0937   GFRAA 6* 02/24/2014 0359   Estimated Creatinine Clearance: 7.7 mL/min (by C-G formula based on Cr of 17.55).  COAG Lab Results  Component Value Date   INR 1.04 05/07/2015   INR 1.0 02/22/2014    Radiology Dg Chest 2 View  05/07/2015  CLINICAL DATA:  Obstructive dialysis port EXAM: CHEST  2 VIEW COMPARISON:  Portable chest x-ray of February 22, 2014 FINDINGS: The lungs are adequately inflated. There is no pulmonary edema or alveolar infiltrate. There is no pleural effusion or pneumothorax. The heart and pulmonary vascularity are normal. A distal left subclavian vascular graft is present. The bony thorax is unremarkable. IMPRESSION: There is no active cardiopulmonary  disease. Electronically Signed   By: David  Martinique M.D.   On: 05/07/2015 10:03    Assessment/Plan 1.  Complication dialysis device with thrombosis AV access:  Patient's left arm brachial cephalic fistula dialysis access is working poor;y per his dialysis center and they were not able to cannulate him today. The patient will undergo angiography and correction of any defects using interventional techniques. Potassium will be drawn to ensure that it is an appropriate level prior to performing intervention. 2.  End-stage renal disease requiring hemodialysis:  Patient will continue dialysis therapy without further interruption if a successful salvage is not achieved then catheter will be placed. Dialysis has already been arranged since the patient missed their previous session 3.  Hypertension:  Patient will continue medical management; nephrology is following no changes in oral medications. 4. Diabetes mellitus:  Glucose will be monitored and oral medications been held this morning once the patient has undergone the patient's procedure po intake will be  reinitiated and again Accu-Cheks will be used to assess the blood glucose level and treat as needed. The patient will be restarted on the patient's usual hypoglycemic regime     Schnier, Dolores Lory, MD  05/07/2015 1:40 PM

## 2015-05-07 NOTE — ED Provider Notes (Signed)
[facility] Emergency Department Provider Note  ____________________________________________  Time seen: On arrival  I have reviewed the triage vital signs and the nursing notes.   HISTORY  Chief Complaint unable to access dialysis catheter     HPI [name] [name] is a 40 y.o. 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 Patient had procedure performed on January 5 including angioplasty and stent placement at [facility]. He was subsequently able to have dialysis on Thursday and Friday. Patient presented for dialysis today and they were unable to perform dialysis because of access issues. Patient does complain of shortness of breath and "I feel fluid building up". Denies chest pain     Past Medical History  Diagnosis Date  . Hypertension   . Renal disorder   . Diabetes mellitus without complication ([place])     There are no active problems to display for this patient.   History reviewed. No pertinent past surgical history.  Current Outpatient Rx  Name  Route  Sig  Dispense  Refill  . amLODipine (NORVASC) 10 MG tablet   Oral   Take 1 tablet by mouth daily.         . carvedilol (COREG) 25 MG tablet   Oral   Take 1 tablet by mouth 2 (two) times daily.         Marland Kitchen FOSRENOL 1000 MG chewable tablet   Oral   Chew 2 tablets by mouth 4 (four) times daily. With 3 meals and 1 snack.      6     Dispense as written.   . furosemide (LASIX) 40 MG tablet   Oral   Take 1 tablet by mouth 2 (two) times daily.         Marland Kitchen HUMULIN 70/30 (70-30) 100 UNIT/ML injection   Subcutaneous   Inject 10-30 Units into the skin 2 (two) times daily with a meal. 10 units before breakfast and 30 units before super           Dispense as written.   . hydrALAZINE (APRESOLINE) 50 MG tablet   Oral   Take 1 tablet by mouth 3 (three) times daily.         Marland Kitchen losartan (COZAAR) 100 MG tablet   Oral    Take 1 tablet by mouth daily.         . SENSIPAR 60 MG tablet   Oral   Take 1 tablet by mouth daily.      3     Dispense as written.   . warfarin (COUMADIN) 4 MG tablet   Oral   Take 1 tablet by mouth daily.           Allergies Lisinopril  History reviewed. No pertinent family history.  Social History Social History  Substance Use Topics  . Smoking status: Never Smoker   . Smokeless tobacco: None  . Alcohol Use: No    Review of Systems  Constitutional: Negative for fever. Eyes: Negative for visual changes. ENT: Negative for sore throat Cardiovascular: Negative for chest pain. Respiratory: Mild shortness of breath Gastrointestinal: Negative for abdominal pain, vomiting and diarrhea. Genitourinary: Negative for dysuria. Musculoskeletal: Negative for back pain. Skin: Negative for redness or rash or discharge Neurological: Negative for headaches or focal weakness Psychiatric: No anxiety    ____________________________________________   PHYSICAL EXAM:  VITAL SIGNS: ED Triage Vitals  Enc Vitals Group     BP 05/07/15 0922 138/89  mmHg     Pulse Rate 05/07/15 0922 88     Resp 05/07/15 0922 18     Temp 05/07/15 0922 98.3 F (36.8 C)     Temp Source 05/07/15 0922 Oral     SpO2 05/07/15 0922 99 %     Weight 05/07/15 0922 277 lb 3.2 oz (125.737 kg)     Height 05/07/15 0922 6\' 1"  (1.854 m)     Head Cir --      Peak Flow --      Pain Score 05/07/15 0912 0     Pain Loc --      Pain Edu? --      Excl. in [place]? --      Constitutional: Alert and oriented.  no acute distress, irritable Eyes: Conjunctivae are normal.  ENT   Head: Normocephalic and atraumatic.   Mouth/Throat: Mucous membranes are moist. Cardiovascular: Normal rate, regular rhythm. Normal and symmetric distal pulses are present in all extremities. No murmurs, rubs, or gallops. Respiratory: Normal respiratory effort without tachypnea nor retractions. Possible right basilar  rales gastrointestinal: Soft and non-tender in all quadrants. No distention. There is no CVA tenderness. Genitourinary: deferred Musculoskeletal: Nontender with normal range of motion in all extremities. No lower extremity tenderness nor edema. left brachiocephalic AV fistula, positive thrill but perhaps hyper pulsatile , extremity is warm and well perfused  Neurologic:  Normal speech and language. No gross focal neurologic deficits are appreciated. Skin:  Skin is warm, dry and intact. No rash noted. Psychiatric: Mood and affect are normal. Patient exhibits appropriate insight and judgment.  ____________________________________________    LABS (pertinent positives/negatives)  Labs Reviewed  CBC - Abnormal; Notable for the following:    RBC 4.14 (*)    Hemoglobin 11.4 (*)    HCT 34.8 (*)    RDW 15.4 (*)    All other components within normal limits  COMPREHENSIVE METABOLIC PANEL - Abnormal; Notable for the following:    Chloride 100 (*)    Glucose, Bld 144 (*)    BUN 75 (*)    Creatinine, Ser 17.55 (*)    Calcium 7.7 (*)    AST 14 (*)    ALT 13 (*)    GFR calc non Af Amer 3 (*)    GFR calc Af Amer 3 (*)    Anion gap 16 (*)    All other components within normal limits  APTT  PROTIME-INR    ____________________________________________   EKG  None  ____________________________________________    RADIOLOGY I have personally reviewed any xrays that were ordered on this patient: Chest x-ray unremarkable  ____________________________________________   PROCEDURES  Procedure(s) performed: none  Critical Care performed: none  ____________________________________________   INITIAL IMPRESSION / ASSESSMENT AND PLAN / ED COURSE  Pertinent labs & imaging results that were available during my care of the patient were reviewed by me and considered in my medical decision making (see chart for detail)  discussed with Dr. Delana Meyer of vascular surgery. He recommends lab work  and chest x-ray to help gauge when fistulogram should be performed.  Discussed labs and x-ray with Dr. Radene Knee of nephrology who feels the patient could potentially wait to be dialyzed tomorrow if necessary   Again discussed with Dr. Delana Meyer who notes that they will be able to get it done this afternoon. I will discharge the patient to special surgery where he will undergo fistulogram and treatment by Dr. dew ____________________________________________   FINAL CLINICAL IMPRESSION(S) / ED DIAGNOSES  Final  diagnoses:  AV fistula stenosis, initial encounter (HCC)     Lavonia Drafts, MD 05/07/15 1425

## 2015-05-07 NOTE — ED Notes (Signed)
Sent here from dialysis center, they were unable to access the catheter

## 2015-05-08 ENCOUNTER — Encounter: Payer: Self-pay | Admitting: Vascular Surgery

## 2015-06-28 ENCOUNTER — Ambulatory Visit
Admission: RE | Admit: 2015-06-28 | Discharge: 2015-06-28 | Disposition: A | Discharge status: Home / Self Care | Payer: Medicare Other | Source: Ambulatory Visit | Attending: Cardiovascular Disease | Admitting: Cardiovascular Disease

## 2015-06-28 ENCOUNTER — Ambulatory Visit: Admission: RE | Admit: 2015-06-28 | Payer: Medicare Other | Source: Ambulatory Visit | Admitting: Cardiovascular Disease

## 2015-06-28 DIAGNOSIS — Z538 Procedure and treatment not carried out for other reasons: Secondary | ICD-10-CM | POA: Insufficient documentation

## 2015-06-28 DIAGNOSIS — I240 Acute coronary thrombosis not resulting in myocardial infarction: Secondary | ICD-10-CM | POA: Insufficient documentation

## 2015-06-28 HISTORY — DX: Reserved for inherently not codable concepts without codable children: IMO0001

## 2015-06-28 HISTORY — DX: Heart failure, unspecified: I50.9

## 2015-06-28 LAB — PROTIME-INR
INR: 1.28
PROTHROMBIN TIME: 16.1 s — AB (ref 11.4–15.0)

## 2015-06-28 NOTE — Progress Notes (Signed)
Patient stated that he wanted to speak with MD before proceeding with pre-procedure preparation. Dr Humphrey Rolls notified and will come talk with patient. Dr Humphrey Rolls also notified that no orders are in for procedure.

## 2015-06-28 NOTE — Progress Notes (Signed)
Patient partially prepped for procedure after speaking with MD. Patient had eaten at 0400, MD notified and gave telephone order to cancel procedure and reschedule. Patient rescheduled by RN before leaving.

## 2015-07-05 ENCOUNTER — Ambulatory Visit
Admission: RE | Admit: 2015-07-05 | Discharge: 2015-07-05 | Disposition: A | Discharge status: Home / Self Care | Payer: Medicare Other | Source: Ambulatory Visit | Attending: Cardiovascular Disease | Admitting: Cardiovascular Disease

## 2015-07-05 ENCOUNTER — Encounter: Payer: Self-pay | Admitting: *Deleted

## 2015-07-05 ENCOUNTER — Encounter
Admission: RE | Disposition: A | Discharge status: Home / Self Care | Payer: Self-pay | Source: Ambulatory Visit | Attending: Cardiovascular Disease

## 2015-07-05 DIAGNOSIS — I083 Combined rheumatic disorders of mitral, aortic and tricuspid valves: Secondary | ICD-10-CM | POA: Insufficient documentation

## 2015-07-05 DIAGNOSIS — R079 Chest pain, unspecified: Secondary | ICD-10-CM | POA: Diagnosis present

## 2015-07-05 DIAGNOSIS — I082 Rheumatic disorders of both aortic and tricuspid valves: Secondary | ICD-10-CM | POA: Diagnosis not present

## 2015-07-05 HISTORY — PX: TEE WITHOUT CARDIOVERSION: SHX5443

## 2015-07-05 SURGERY — ECHOCARDIOGRAM, TRANSESOPHAGEAL
Anesthesia: Moderate Sedation

## 2015-07-05 MED ORDER — MIDAZOLAM HCL 5 MG/ML IJ SOLN
1.0000 mg | Freq: Once | INTRAMUSCULAR | Status: AC
  Administered 2015-07-05: 1 mg via INTRAVENOUS

## 2015-07-05 MED ORDER — MIDAZOLAM HCL 5 MG/5ML IJ SOLN
INTRAMUSCULAR | Status: AC | PRN
  Administered 2015-07-05 (×2): 2 mg via INTRAVENOUS
  Administered 2015-07-05: 1 mg via INTRAVENOUS
  Administered 2015-07-05: 2 mg via INTRAVENOUS

## 2015-07-05 MED ORDER — MIDAZOLAM HCL 2 MG/2ML IJ SOLN
INTRAMUSCULAR | Status: AC
  Filled 2015-07-05: qty 4

## 2015-07-05 MED ORDER — BUTAMBEN-TETRACAINE-BENZOCAINE 2-2-14 % EX AERO
INHALATION_SPRAY | CUTANEOUS | Status: AC
  Filled 2015-07-05: qty 20

## 2015-07-05 MED ORDER — SODIUM CHLORIDE 0.9 % IV SOLN: INTRAVENOUS | Status: DC

## 2015-07-05 MED ORDER — FENTANYL CITRATE (PF) 100 MCG/2ML IJ SOLN
INTRAMUSCULAR | Status: AC
  Filled 2015-07-05: qty 4

## 2015-07-05 MED ORDER — MIDAZOLAM HCL 5 MG/5ML IJ SOLN
INTRAMUSCULAR | Status: AC
  Filled 2015-07-05: qty 5

## 2015-07-05 MED ORDER — LIDOCAINE VISCOUS 2 % MT SOLN
OROMUCOSAL | Status: AC
  Filled 2015-07-05: qty 15

## 2015-07-05 MED ORDER — FENTANYL CITRATE (PF) 100 MCG/2ML IJ SOLN
INTRAMUSCULAR | Status: AC | PRN
  Administered 2015-07-05 (×5): 50 ug via INTRAVENOUS

## 2015-07-05 MED ORDER — FENTANYL CITRATE (PF) 100 MCG/2ML IJ SOLN
INTRAMUSCULAR | Status: AC
  Filled 2015-07-05: qty 2

## 2015-07-05 NOTE — Progress Notes (Signed)
*  PRELIMINARY RESULTS* Echocardiogram Echocardiogram Transesophageal has been performed.  [name] [name] 07/05/2015, 9:01 AM

## 2015-07-05 NOTE — Progress Notes (Signed)
Patient tolerated procedure well. Alert, reporting no pain at this time. Reviewed discharge instructions with patient and patient's mother, acknowledged follow up appointment with Dr Humphrey Rolls. No new prescriptions at this time. Vital signs stable. Will discharge home per orders shortly.

## 2015-07-06 ENCOUNTER — Encounter: Payer: Self-pay | Admitting: Cardiovascular Disease

## 2015-11-27 DIAGNOSIS — E1165 Type 2 diabetes mellitus with hyperglycemia: Secondary | ICD-10-CM | POA: Insufficient documentation

## 2015-11-27 DIAGNOSIS — Z794 Long term (current) use of insulin: Secondary | ICD-10-CM | POA: Insufficient documentation

## 2016-06-05 ENCOUNTER — Encounter: Payer: Medicare Other | Attending: Surgery | Admitting: Surgery

## 2016-06-05 DIAGNOSIS — E1122 Type 2 diabetes mellitus with diabetic chronic kidney disease: Secondary | ICD-10-CM | POA: Insufficient documentation

## 2016-06-05 DIAGNOSIS — I130 Hypertensive heart and chronic kidney disease with heart failure and stage 1 through stage 4 chronic kidney disease, or unspecified chronic kidney disease: Secondary | ICD-10-CM | POA: Diagnosis not present

## 2016-06-05 DIAGNOSIS — Z7901 Long term (current) use of anticoagulants: Secondary | ICD-10-CM | POA: Diagnosis not present

## 2016-06-05 DIAGNOSIS — I509 Heart failure, unspecified: Secondary | ICD-10-CM | POA: Diagnosis not present

## 2016-06-05 DIAGNOSIS — Z79899 Other long term (current) drug therapy: Secondary | ICD-10-CM | POA: Insufficient documentation

## 2016-06-05 DIAGNOSIS — Z794 Long term (current) use of insulin: Secondary | ICD-10-CM | POA: Insufficient documentation

## 2016-06-05 DIAGNOSIS — N189 Chronic kidney disease, unspecified: Secondary | ICD-10-CM | POA: Insufficient documentation

## 2016-06-05 DIAGNOSIS — E11622 Type 2 diabetes mellitus with other skin ulcer: Secondary | ICD-10-CM | POA: Insufficient documentation

## 2016-06-05 DIAGNOSIS — L97221 Non-pressure chronic ulcer of left calf limited to breakdown of skin: Secondary | ICD-10-CM | POA: Diagnosis not present

## 2016-06-05 DIAGNOSIS — Z992 Dependence on renal dialysis: Secondary | ICD-10-CM | POA: Diagnosis not present

## 2016-06-06 NOTE — Progress Notes (Signed)
DURELLE, ZEPEDA ([id number]) Visit Report for 06/05/2016 Allergy List Details Patient Name: LEONTE, HORRIGAN. Date of Service: 06/05/2016 1:15 PM Medical Record Number: [id number] Patient Account Number: [id number] Date of Birth/Sex: November 01, 1975 (41 y.o. Male) Treating RN: Montey Hora Primary Care Bastien Strawser: Kasandra Knudsen Other Clinician: Referring Isa Kohlenberg: Kasandra Knudsen Treating Diyana Starrett/Extender: Frann Rider in Treatment: 0 Allergies Active Allergies lisinopril Allergy Notes Electronic Signature(s) Signed: 06/05/2016 5:25:14 PM By: Montey Hora Entered By: Montey Hora on 06/05/2016 13:45:59 [name] ([id number]) -------------------------------------------------------------------------------- Arrival Information Details Patient Name: [name]. Date of Service: 06/05/2016 1:15 PM Medical Record Number: [id number] Patient Account Number: [id number] Date of Birth/Sex: 06/19/1975 (41 y.o. Male) Treating RN: Montey Hora Primary Care Maddeline Roorda: Kasandra Knudsen Other Clinician: Referring Jacion Dismore: Kasandra Knudsen Treating Shalise Rosado/Extender: Frann Rider in Treatment: 0 Visit Information Patient Arrived: Ambulatory Arrival Time: 13:42 Accompanied By: self Transfer Assistance: None Patient Identification Verified: Yes Secondary Verification Process Yes Completed: Patient Has Alerts: Yes Patient Alerts: Patient on Blood Thinner DMII Electronic Signature(s) Signed: 06/05/2016 5:25:14 PM By: Montey Hora Entered By: Montey Hora on 06/05/2016 13:42:43 [name] ([id number]) -------------------------------------------------------------------------------- Clinic Level of Care Assessment Details Patient Name: [name]. Date of Service: 06/05/2016 1:15 PM Medical Record Number: [id number] Patient Account Number: [id number] Date of Birth/Sex: [birth year] (41 y.o. Male) Treating RN: Montey Hora Primary Care Sherika Kubicki: Kasandra Knudsen Other Clinician: Referring Ellyanna Holton: Kasandra Knudsen Treating Garnell Phenix/Extender: Frann Rider in Treatment: 0 Clinic Level of Care Assessment Items TOOL 2 Quantity Score []  - Use when only an EandM is performed on the INITIAL visit 0 ASSESSMENTS - Nursing Assessment / Reassessment X - General Physical Exam (combine w/ comprehensive assessment (listed just 1 20 below) when performed on new pt. evals) X - Comprehensive Assessment (HX, ROS, Risk Assessments, Wounds Hx, etc.) 1 25 ASSESSMENTS - Wound and Skin Assessment / Reassessment X - Simple Wound Assessment / Reassessment - one wound 1 5 []  - Complex Wound Assessment / Reassessment - multiple wounds 0 []  - Dermatologic / Skin Assessment (not related to wound area) 0 ASSESSMENTS - Ostomy and/or Continence Assessment and Care []  - Incontinence Assessment and Management 0 []  - Ostomy Care Assessment and Management (repouching, etc.) 0 PROCESS - Coordination of Care X - Simple Patient / Family Education for ongoing care 1 15 []  - Complex (extensive) Patient / Family Education for ongoing care 0 []  - Staff obtains Programmer, systems, Records, Test Results / Process Orders 0 []  - Staff telephones HHA, Nursing Homes / Clarify orders / etc 0 []  - Routine Transfer to another Facility (non-emergent condition) 0 []  - Routine Hospital Admission (non-emergent condition) 0 []  - New Admissions / Biomedical engineer / Ordering NPWT, Apligraf, etc. 0 []  - Emergency Hospital Admission (emergent condition) 0 X - Simple Discharge Coordination 1 10 FAIZAAN, FALLS ([id number]) []  - Complex (extensive) Discharge Coordination 0 PROCESS - Special Needs []  - Pediatric / Minor Patient Management 0 []  - Isolation Patient Management 0 []  - Hearing / Language / Visual special needs 0 []  - Assessment of Community assistance (transportation, D/C planning, etc.) 0 []  - Additional assistance / Altered mentation 0 []  - Support Surface(s) Assessment  (bed, cushion, seat, etc.) 0 INTERVENTIONS - Wound Cleansing / Measurement X - Wound Imaging (photographs - any number of wounds) 1 5 []  - Wound Tracing (instead of photographs) 0 X - Simple Wound Measurement - one wound 1 5 []  - Complex Wound Measurement - multiple wounds 0 X - Simple Wound Cleansing -  one wound 1 5 []  - Complex Wound Cleansing - multiple wounds 0 INTERVENTIONS - Wound Dressings []  - Small Wound Dressing one or multiple wounds 0 []  - Medium Wound Dressing one or multiple wounds 0 X - Large Wound Dressing one or multiple wounds 1 20 []  - Application of Medications - injection 0 INTERVENTIONS - Miscellaneous []  - External ear exam 0 []  - Specimen Collection (cultures, biopsies, blood, body fluids, etc.) 0 []  - Specimen(s) / Culture(s) sent or taken to Lab for analysis 0 []  - Patient Transfer (multiple staff / Harrel Lemon Lift / Similar devices) 0 []  - Simple Staple / Suture removal (25 or less) 0 []  - Complex Staple / Suture removal (26 or more) 0 AYVIN, LIPINSKI ([id number]) []  - Hypo / Hyperglycemic Management (close monitor of Blood Glucose) 0 X - Ankle / Brachial Index (ABI) - do not check if billed separately 1 15 Has the patient been seen at the hospital within the last three years: Yes Total Score: 125 Level Of Care: New/Established - Level 4 Electronic Signature(s) Signed: 06/05/2016 5:25:14 PM By: Montey Hora Entered By: Montey Hora on 06/05/2016 17:06:38 [name] ([id number]) -------------------------------------------------------------------------------- Encounter Discharge Information Details Patient Name: [name]. Date of Service: 06/05/2016 1:15 PM Medical Record Number: [id number] Patient Account Number: [id number] Date of Birth/Sex: October 27, 1975 (41 y.o. Male) Treating RN: Montey Hora Primary Care Chassidy Layson: Kasandra Knudsen Other Clinician: Referring Maston Wight: Kasandra Knudsen Treating Lorean Ekstrand/Extender: Frann Rider in  Treatment: 0 Encounter Discharge Information Items Discharge Pain Level: 0 Discharge Condition: Stable Ambulatory Status: Ambulatory Discharge Destination: Home Transportation: Private Auto Accompanied By: self Schedule Follow-up Appointment: Yes Medication Reconciliation completed and provided to Patient/Care No Kyree Fedorko: Provided on Clinical Summary of Care: 06/05/2016 Form Type Recipient Paper Patient LT Electronic Signature(s) Signed: 06/05/2016 5:07:48 PM By: Montey Hora Previous Signature: 06/05/2016 2:30:28 PM Version By: Ruthine Dose Entered By: Montey Hora on 06/05/2016 17:07:48 [name] ([id number]) -------------------------------------------------------------------------------- Lower Extremity Assessment Details Patient Name: [name]. Date of Service: 06/05/2016 1:15 PM Medical Record Number: [id number] Patient Account Number: [id number] Date of Birth/Sex: Oct 07, 1975 (41 y.o. Male) Treating RN: Montey Hora Primary Care Chastidy Ranker: Kasandra Knudsen Other Clinician: Referring Salvatore Shear: Kasandra Knudsen Treating Caryn Gienger/Extender: Frann Rider in Treatment: 0 Edema Assessment Assessed: [Left: No] [Right: No] Edema: [Left: N] [Right: o] Calf Left: Right: Point of Measurement: 36 cm From Medial Instep 37.8 cm cm Ankle Left: Right: Point of Measurement: 12 cm From Medial Instep 23.7 cm cm Vascular Assessment Pulses: Dorsalis Pedis Palpable: [Left:Yes] Doppler Audible: [Left:Yes] Posterior Tibial Palpable: [Left:Yes] Doppler Audible: [Left:Yes] Extremity colors, hair growth, and conditions: Extremity Color: [Left:Normal] Hair Growth on Extremity: [Left:Yes] Temperature of Extremity: [Left:Warm] Capillary Refill: [Left:< 3 seconds] Blood Pressure: Brachial: [Left:144] Dorsalis Pedis: 172 [Left:Dorsalis Pedis:] Ankle: Posterior Tibial: 184 [Left:Posterior Tibial: 1.28] Toe Nail Assessment Left: Right: Thick: Yes Discolored:  No Deformed: No Improper Length and Hygiene: No YERICK, EGGEBRECHT ([id number]) Electronic Signature(s) Signed: 06/05/2016 5:25:14 PM By: Montey Hora Entered By: Montey Hora on 06/05/2016 14:14:51 [name] ([id number]) -------------------------------------------------------------------------------- Multi Wound Chart Details Patient Name: [name]. Date of Service: 06/05/2016 1:15 PM Medical Record Number: [id number] Patient Account Number: [id number] Date of Birth/Sex: 22-Jan-1976 (41 y.o. Male) Treating RN: Montey Hora Primary Care Laterica Matarazzo: Kasandra Knudsen Other Clinician: Referring Trejuan Matherne: Kasandra Knudsen Treating Kale Rondeau/Extender: Frann Rider in Treatment: 0 Vital Signs Height(in): 73 Pulse(bpm): 82 Weight(lbs): 285 Blood Pressure 141/81 (mmHg): Body Mass Index(BMI): 38 Temperature(F): 98.1 Respiratory Rate 16 (breaths/min):  Photos: [1:No Photos] [N/A:N/A] Wound Location: [1:Left Lower Leg - Medial] [N/A:N/A] Wounding Event: [1:Blister] [N/A:N/A] Primary Etiology: [1:Venous Leg Ulcer] [N/A:N/A] Comorbid History: [1:Hypertension, Type II Diabetes] [N/A:N/A] Date Acquired: [1:06/03/2016] [N/A:N/A] Weeks of Treatment: [1:0] [N/A:N/A] Wound Status: [1:Open] [N/A:N/A] Measurements L x W x D 8x11.5x0.1 [N/A:N/A] (cm) Area (cm) : [1:72.257] [N/A:N/A] Volume (cm) : [1:7.226] [N/A:N/A] Classification: [1:Partial Thickness] [N/A:N/A] HBO Classification: [1:Grade 1] [N/A:N/A] Exudate Amount: [1:Large] [N/A:N/A] Exudate Type: [1:Serous] [N/A:N/A] Exudate Color: [1:amber] [N/A:N/A] Wound Margin: [1:Flat and Intact] [N/A:N/A] Granulation Amount: [1:Large (67-100%)] [N/A:N/A] Granulation Quality: [1:Red] [N/A:N/A] Necrotic Amount: [1:None Present (0%)] [N/A:N/A] Exposed Structures: [1:Fascia: No Fat Layer (Subcutaneous Tissue) Exposed: No Tendon: No Muscle: No Joint: No Bone: No] [N/A:N/A] Limited to Skin Breakdown Epithelialization: None  N/A N/A Periwound Skin Texture: Excoriation: No N/A N/A Induration: No Callus: No Crepitus: No Rash: No Scarring: No Periwound Skin Maceration: No N/A N/A Moisture: Dry/Scaly: No Periwound Skin Color: Erythema: Yes N/A N/A Atrophie Blanche: No Cyanosis: No Ecchymosis: No Hemosiderin Staining: No Mottled: No Pallor: No Rubor: No Erythema Location: Circumferential N/A N/A Temperature: No Abnormality N/A N/A Tenderness on Yes N/A N/A Palpation: Wound Preparation: Ulcer Cleansing: N/A N/A Rinsed/Irrigated with Saline Topical Anesthetic Applied: Other: lidocaine 4% Treatment Notes Electronic Signature(s) Signed: 06/05/2016 2:30:16 PM By: Christin Fudge MD, FACS Entered By: Christin Fudge on 06/05/2016 14:30:15 [name] ([id number]) -------------------------------------------------------------------------------- [place] Details Patient Name: [name]. Date of Service: 06/05/2016 1:15 PM Medical Record Number: [id number] Patient Account Number: [id number] Date of Birth/Sex: 1975/10/08 (41 y.o. Male) Treating RN: Montey Hora Primary Care Tabia Landowski: Kasandra Knudsen Other Clinician: Referring Batu Cassin: Kasandra Knudsen Treating Steel Kerney/Extender: Frann Rider in Treatment: 0 Active Inactive ` Orientation to the Wound Care Program Nursing Diagnoses: Knowledge deficit related to the wound healing center program Goals: Patient/caregiver will verbalize understanding of the [facility] Program Date Initiated: 06/05/2016 Target Resolution Date: 08/29/2016 Goal Status: Active Interventions: Provide education on orientation to the wound center Notes: ` Wound/Skin Impairment Nursing Diagnoses: Knowledge deficit related to ulceration/compromised skin integrity Goals: Patient/caregiver will verbalize understanding of skin care regimen Date Initiated: 06/05/2016 Target Resolution Date: 08/29/2016 Goal Status: Active Ulcer/skin  breakdown will have a volume reduction of 30% by week 4 Date Initiated: 06/05/2016 Target Resolution Date: 08/29/2016 Goal Status: Active Ulcer/skin breakdown will have a volume reduction of 50% by week 8 Date Initiated: 06/05/2016 Target Resolution Date: 08/29/2016 Goal Status: Active Ulcer/skin breakdown will have a volume reduction of 80% by week 12 Date Initiated: 06/05/2016 Target Resolution Date: 08/29/2016 Goal Status: Active Ulcer/skin breakdown will heal within 14 weeks Date Initiated: 06/05/2016 Target Resolution Date: 08/29/2016 JACK, BOLIO ([id number]) Goal Status: Active Interventions: Assess patient/caregiver ability to obtain necessary supplies Assess patient/caregiver ability to perform ulcer/skin care regimen upon admission and as needed Assess ulceration(s) every visit Notes: Electronic Signature(s) Signed: 06/05/2016 5:25:14 PM By: Montey Hora Entered By: Montey Hora on 06/05/2016 14:24:43 [name] ([id number]) -------------------------------------------------------------------------------- Pain Assessment Details Patient Name: [name]. Date of Service: 06/05/2016 1:15 PM Medical Record Number: [id number] Patient Account Number: [id number] Date of Birth/Sex: 1976-03-08 (41 y.o. Male) Treating RN: Montey Hora Primary Care Jerre Diguglielmo: Kasandra Knudsen Other Clinician: Referring Derold Dorsch: Kasandra Knudsen Treating Chantele Corado/Extender: Frann Rider in Treatment: 0 Active Problems Location of Pain Severity and Description of Pain Patient Has Paino No Site Locations Pain Management and Medication Current Pain Management: Notes Topical or injectable lidocaine is offered to patient for acute pain when surgical debridement is performed. If needed,  Patient is instructed to use over the counter pain medication for the following 24-48 hours after debridement. Wound care MDs do not prescribed pain medications. Patient has chronic pain or  uncontrolled pain. Patient has been instructed to make an appointment with their Primary Care Physician for pain management. Electronic Signature(s) Signed: 06/05/2016 5:25:14 PM By: Montey Hora Entered By: Montey Hora on 06/05/2016 13:42:55 [name] ([id number]) -------------------------------------------------------------------------------- Patient/Caregiver Education Details Patient Name: [name]. Date of Service: 06/05/2016 1:15 PM Medical Record Number: [id number] Patient Account Number: [id number] Date of Birth/Gender: [birth year] (41 y.o. Male) Treating RN: Montey Hora Primary Care Physician: Kasandra Knudsen Other Clinician: Referring Physician: Kasandra Knudsen Treating Physician/Extender: Frann Rider in Treatment: 0 Education Assessment Education Provided To: Patient Education Topics Provided Venous: Handouts: Other: wrap precautions Methods: Explain/Verbal Responses: State content correctly Electronic Signature(s) Signed: 06/05/2016 5:25:14 PM By: Montey Hora Entered By: Montey Hora on 06/05/2016 17:08:17 [name] ([id number]) -------------------------------------------------------------------------------- Wound Assessment Details Patient Name: [name]. Date of Service: 06/05/2016 1:15 PM Medical Record Number: [id number] Patient Account Number: [id number] Date of Birth/Sex: December 26, 1975 (41 y.o. Male) Treating RN: Montey Hora Primary Care Pranathi Winfree: Kasandra Knudsen Other Clinician: Referring Shaylee Stanislawski: Kasandra Knudsen Treating Yeray Tomas/Extender: Frann Rider in Treatment: 0 Wound Status Wound Number: 1 Primary Etiology: Venous Leg Ulcer Wound Location: Left Lower Leg - Medial Wound Status: Open Wounding Event: Blister Comorbid History: Hypertension, Type II Diabetes Date Acquired: 06/03/2016 Weeks Of Treatment: 0 Clustered Wound: No Wound Measurements Length: (cm) 8 Width: (cm) 11.5 Depth: (cm)  0.1 Area: (cm) 72.257 Volume: (cm) 7.226 % Reduction in Area: % Reduction in Volume: Epithelialization: None Tunneling: No Undermining: No Wound Description Classification: Partial Thickness Foul Odor Diabetic Severity (Wagner): Grade 1 Slough/Fib Wound Margin: Flat and Intact Exudate Amount: Large Exudate Type: Serous Exudate Color: amber After Cleansing: No rino No Wound Bed Granulation Amount: Large (67-100%) Exposed Structure Granulation Quality: Red Fascia Exposed: No Necrotic Amount: None Present (0%) Fat Layer (Subcutaneous Tissue) Exposed: No Tendon Exposed: No Muscle Exposed: No Joint Exposed: No Bone Exposed: No Limited to Skin Breakdown Periwound Skin Texture Texture Color No Abnormalities Noted: No No Abnormalities Noted: No Callus: No Atrophie Blanche: No Crepitus: No Cyanosis: No Excoriation: No Ecchymosis: No SAFI, CULOTTA ([id number]) Induration: No Erythema: Yes Rash: No Erythema Location: Circumferential Scarring: No Hemosiderin Staining: No Mottled: No Moisture Pallor: No No Abnormalities Noted: No Rubor: No Dry / Scaly: No Maceration: No Temperature / Pain Temperature: No Abnormality Tenderness on Palpation: Yes Wound Preparation Ulcer Cleansing: Rinsed/Irrigated with Saline Topical Anesthetic Applied: Other: lidocaine 4%, Electronic Signature(s) Signed: 06/05/2016 5:25:14 PM By: Montey Hora Entered By: Montey Hora on 06/05/2016 14:08:44 [name] ([id number]) -------------------------------------------------------------------------------- Vitals Details Patient Name: [name]. Date of Service: 06/05/2016 1:15 PM Medical Record Number: [id number] Patient Account Number: [id number] Date of Birth/Sex: [birth year] (41 y.o. Male) Treating RN: Montey Hora Primary Care Letita Prentiss: Kasandra Knudsen Other Clinician: Referring Soumya Colson: Kasandra Knudsen Treating Dinna Severs/Extender: Frann Rider in  Treatment: 0 Vital Signs Time Taken: 13:43 Temperature (F): 98.1 Height (in): 73 Pulse (bpm): 82 Source: Measured Respiratory Rate (breaths/min): 16 Weight (lbs): 285 Blood Pressure (mmHg): 141/81 Source: Measured Reference Range: 80 - 120 mg / dl Body Mass Index (BMI): 37.6 Electronic Signature(s) Signed: 06/05/2016 5:25:14 PM By: Montey Hora Entered By: Montey Hora on 06/05/2016 13:45:12

## 2016-06-06 NOTE — Progress Notes (Signed)
[name] [name], [name] [name] ([id number]) Visit Report for 06/05/2016 Abuse/Suicide Risk Screen Details Patient Name: [name] [name], [name] [name]. Date of Service: 06/05/2016 1:15 PM Medical Record Number: [id number] Patient Account Number: [id number] Date of Birth/Sex: [birth year] (41 y.o. Male) Treating RN: Montey Hora Primary Care Nikyla Navedo: Kasandra Knudsen Other Clinician: Referring Namiko Pritts: Kasandra Knudsen Treating Zsazsa Bahena/Extender: Frann Rider in Treatment: 0 Abuse/Suicide Risk Screen Items Answer ABUSE/SUICIDE RISK SCREEN: Has anyone close to you tried to hurt or harm you recentlyo No Do you feel uncomfortable with anyone in your familyo No Has anyone forced you do things that you didnot want to doo No Do you have any thoughts of harming yourselfo No Patient displays signs or symptoms of abuse and/or neglect. No Electronic Signature(s) Signed: 06/05/2016 5:25:14 PM By: Montey Hora Entered By: Montey Hora on 06/05/2016 13:52:57 [name] [name] ([id number]) -------------------------------------------------------------------------------- Activities of Daily Living Details Patient Name: [name] [name]. Date of Service: 06/05/2016 1:15 PM Medical Record Number: [id number] Patient Account Number: [id number] Date of Birth/Sex: Dec 07, 1975 (41 y.o. Male) Treating RN: Montey Hora Primary Care Kelsi Benham: Kasandra Knudsen Other Clinician: Referring Aalaysia Liggins: Kasandra Knudsen Treating Lakiyah Arntson/Extender: Frann Rider in Treatment: 0 Activities of Daily Living Items Answer Activities of Daily Living (Please select one for each item) Drive Automobile Completely Able Take Medications Completely Able Use Telephone Completely Able Care for Appearance Completely Able Use Toilet Completely Able Bath / Shower Completely Able Dress Self Completely Able Feed Self Completely Able Walk Completely Able Get In / Out Bed Completely Able Housework Completely Able Prepare Meals Completely  [place] for Self Completely Able Electronic Signature(s) Signed: 06/05/2016 5:25:14 PM By: Montey Hora Entered By: Montey Hora on 06/05/2016 13:53:15 [name] [name] ([id number]) -------------------------------------------------------------------------------- Education Assessment Details Patient Name: [name] [name]. Date of Service: 06/05/2016 1:15 PM Medical Record Number: [id number] Patient Account Number: [id number] Date of Birth/Sex: 29-Oct-1975 (41 y.o. Male) Treating RN: Montey Hora Primary Care Miley Lindon: Kasandra Knudsen Other Clinician: Referring Rosela Supak: Kasandra Knudsen Treating Axyl Sitzman/Extender: Frann Rider in Treatment: 0 Primary Learner Assessed: Patient Learning Preferences/Education Level/Primary Language Learning Preference: Explanation, Demonstration Highest Education Level: High School Preferred Language: English Cognitive Barrier Assessment/Beliefs Language Barrier: No Translator Needed: No Memory Deficit: No Emotional Barrier: No Cultural/Religious Beliefs Affecting Medical No Care: Physical Barrier Assessment Impaired Vision: No Impaired Hearing: No Decreased Hand dexterity: No Knowledge/Comprehension Assessment Knowledge Level: Medium Comprehension Level: Medium Ability to understand written Medium instructions: Ability to understand verbal Medium instructions: Motivation Assessment Anxiety Level: Calm Cooperation: Cooperative Education Importance: Acknowledges Need Interest in Health Problems: Asks Questions Perception: Coherent Willingness to Engage in Self- Medium Management Activities: Readiness to Engage in Self- Medium Management Activities: Electronic Signature(s) [name] [name], [name] [name] ([id number]) Signed: 06/05/2016 5:25:14 PM By: Montey Hora Entered By: Montey Hora on 06/05/2016 13:53:34 [name] [name]  ([id number]) -------------------------------------------------------------------------------- Fall Risk Assessment Details Patient Name: [name] [name]. Date of Service: 06/05/2016 1:15 PM Medical Record Number: [id number] Patient Account Number: [id number] Date of Birth/Sex: 18-Nov-1975 (41 y.o. Male) Treating RN: Montey Hora Primary Care Suesan Mohrmann: Kasandra Knudsen Other Clinician: Referring Konner Warrior: Kasandra Knudsen Treating Rogers Ditter/Extender: Frann Rider in Treatment: 0 Fall Risk Assessment Items Have you had 2 or more falls in the last 12 monthso 0 No Have you had any fall that resulted in injury in the last 12 monthso 0 No FALL RISK ASSESSMENT: History of falling - immediate or within 3 months 0 No Secondary diagnosis 0 No Ambulatory aid None/bed rest/wheelchair/nurse 0 Yes Crutches/cane/walker 0 No Furniture 0  No IV Access/Saline Lock 0 No Gait/Training Normal/bed rest/immobile 0 Yes Weak 0 No Impaired 0 No Mental Status Oriented to own ability 0 Yes Electronic Signature(s) Signed: 06/05/2016 5:25:14 PM By: Montey Hora Entered By: Montey Hora on 06/05/2016 13:53:41 [name] [name] ([id number]) -------------------------------------------------------------------------------- Foot Assessment Details Patient Name: [name] [name]. Date of Service: 06/05/2016 1:15 PM Medical Record Number: [id number] Patient Account Number: [id number] Date of Birth/Sex: [birth year] (41 y.o. Male) Treating RN: Montey Hora Primary Care Itxel Wickard: Kasandra Knudsen Other Clinician: Referring Kaitlynne Wenz: Kasandra Knudsen Treating Trayvion Embleton/Extender: Frann Rider in Treatment: 0 Foot Assessment Items Site Locations + = Sensation present, - = Sensation absent, C = Callus, U = Ulcer R = Redness, W = Warmth, M = Maceration, PU = Pre-ulcerative lesion F = Fissure, S = Swelling, D = Dryness Assessment Right: Left: Other Deformity: No No Prior Foot Ulcer: No No Prior  Amputation: No No Charcot Joint: No No Ambulatory Status: Ambulatory Without Help Gait: Steady Electronic Signature(s) Signed: 06/05/2016 5:25:14 PM By: Montey Hora Entered By: Montey Hora on 06/05/2016 14:06:16 [name] [name] ([id number]) -------------------------------------------------------------------------------- Nutrition Risk Assessment Details Patient Name: [name] [name]. Date of Service: 06/05/2016 1:15 PM Medical Record Number: [id number] Patient Account Number: [id number] Date of Birth/Sex: [birth year] (41 y.o. Male) Treating RN: Montey Hora Primary Care Cecilia Vancleve: Kasandra Knudsen Other Clinician: Referring Nancylee Gaines: Kasandra Knudsen Treating Tamarra Geiselman/Extender: Frann Rider in Treatment: 0 Height (in): 73 Weight (lbs): 285 Body Mass Index (BMI): 37.6 Nutrition Risk Assessment Items NUTRITION RISK SCREEN: I have an illness or condition that made me change the kind and/or 0 No amount of food I eat I eat fewer than two meals per day 0 No I eat few fruits and vegetables, or milk products 0 No I have three or more drinks of beer, liquor or wine almost every day 0 No I have tooth or mouth problems that make it hard for me to eat 0 No I don't always have enough money to buy the food I need 0 No I eat alone most of the time 0 No I take three or more different prescribed or over-the-counter drugs a 1 Yes day Without wanting to, I have lost or gained 10 pounds in the last six 0 No months I am not always physically able to shop, cook and/or feed myself 0 No Nutrition Protocols Good Risk Protocol 0 No interventions needed Moderate Risk Protocol Electronic Signature(s) Signed: 06/05/2016 5:25:14 PM By: Montey Hora Entered By: Montey Hora on 06/05/2016 13:53:47

## 2016-06-06 NOTE — Progress Notes (Addendum)
MENDY, LAPINSKY ([id number]) Visit Report for 06/05/2016 Chief Complaint Document Details Patient Name: [name] [name], [name] [name]. Date of Service: 06/05/2016 1:15 PM Medical Record Number: [id number] Patient Account Number: [id number] Date of Birth/Sex: Oct 11, 1975 (41 y.o. Male) Treating RN: Montey Hora Primary Care Provider: Kasandra Knudsen Other Clinician: Referring Provider: Kasandra Knudsen Treating Provider/Extender: Frann Rider in Treatment: 0 Information Obtained from: Patient Chief Complaint Patients presents for treatment of an open diabetic ulcer the left lower extremity for 5 days Electronic Signature(s) Signed: 06/05/2016 2:30:38 PM By: Christin Fudge MD, FACS Entered By: Christin Fudge on 06/05/2016 14:30:37 [name] [name] ([id number]) -------------------------------------------------------------------------------- HPI Details Patient Name: [name] [name]. Date of Service: 06/05/2016 1:15 PM Medical Record Number: [id number] Patient Account Number: [id number] Date of Birth/Sex: March 29, 1976 (41 y.o. Male) Treating RN: Montey Hora Primary Care Provider: Kasandra Knudsen Other Clinician: Referring Provider: Kasandra Knudsen Treating Provider/Extender: Frann Rider in Treatment: 0 History of Present Illness Location: left lower extremity Quality: Patient reports experiencing a dull pain to affected area(s). Severity: Patient states wound (s) are getting better. Duration: Patient has had the wound for < 2 weeks prior to presenting for treatment Timing: Pain in wound is Intermittent (comes and goes Context: The wound appeared gradually over time Modifying Factors: Other treatment(s) tried include:local care and oral antibiotic Associated Signs and Symptoms: Patient reports having increase swelling. HPI Description: 41 year old gentleman who has noticed a blister on his left lower extremity which came up 5 days ago and it has drained fluid. He was seen by  hispractitioner who reviewed him and put him on doxycycline now asked him to come to the wound center. His past medical history is significant for diabetes mellitus type 2, congestive heart failure, renal failure, hypertension, and he is receiving hemodialysis 3 times a week. He has never been a smoker. Electronic Signature(s) Signed: 06/05/2016 2:33:52 PM By: Christin Fudge MD, FACS Entered By: Christin Fudge on 06/05/2016 14:33:52 [name] [name] ([id number]) -------------------------------------------------------------------------------- Physical Exam Details Patient Name: [name] [name]. Date of Service: 06/05/2016 1:15 PM Medical Record Number: [id number] Patient Account Number: [id number] Date of Birth/Sex: [birth year] (41 y.o. Male) Treating RN: Montey Hora Primary Care Provider: Kasandra Knudsen Other Clinician: Referring Provider: Kasandra Knudsen Treating Provider/Extender: Frann Rider in Treatment: 0 Constitutional . Pulse regular. Respirations normal and unlabored. Afebrile. . Eyes Nonicteric. Reactive to light. Ears, Nose, Mouth, and Throat Lips, teeth, and gums WNL.Marland Kitchen Moist mucosa without lesions. Neck supple and nontender. No palpable supraclavicular or cervical adenopathy. Normal sized without goiter. Respiratory WNL. No retractions.. Cardiovascular Pedal Pulses WNL. the ABI on the left lower extremity is 1.28. minimal lymphedema left lower extremity. Chest Breasts symmetical and no nipple discharge.. Breast tissue WNL, no masses, lumps, or tenderness.. Gastrointestinal (GI) Abdomen without masses or tenderness.. No liver or spleen enlargement or tenderness.. Lymphatic No adneopathy. No adenopathy. No adenopathy. Musculoskeletal Adexa without tenderness or enlargement.. Digits and nails w/o clubbing, cyanosis, infection, petechiae, ischemia, or inflammatory conditions.. Integumentary (Hair, Skin) No suspicious lesions. No crepitus or fluctuance. No  peri-wound warmth or erythema. No masses.Marland Kitchen Psychiatric Judgement and insight Intact.. No evidence of depression, anxiety, or agitation.. Notes the left lower extremity has a large superficial blister with the skin retracted and using a toothed forcep, I was able to pull the skin down to cover most of the denuded area and we will leave this in place. Electronic Signature(s) Signed: 06/05/2016 2:35:58 PM By: Christin Fudge MD, FACS Entered By: Christin Fudge on 06/05/2016 14:35:57 [name] [name] ([id number]) --------------------------------------------------------------------------------  Physician Orders Details Patient Name: [name] [name], [name] [name]. Date of Service: 06/05/2016 1:15 PM Medical Record Number: [id number] Patient Account Number: [id number] Date of Birth/Sex: 1975-07-24 (41 y.o. Male) Treating RN: Montey Hora Primary Care Provider: Kasandra Knudsen Other Clinician: Referring Provider: Kasandra Knudsen Treating Provider/Extender: Frann Rider in Treatment: 0 Verbal / Phone Orders: Yes Clinician: Montey Hora Read Back and Verified: Yes Diagnosis Coding ICD-10 Coding Code Description E11.622 Type 2 diabetes mellitus with other skin ulcer Z99.2 Dependence on renal dialysis Wound Cleansing Wound #1 Left,Medial Lower Leg o Clean wound with Normal Saline. Anesthetic Wound #1 Left,Medial Lower Leg o Topical Lidocaine 4% cream applied to wound bed prior to debridement Primary Wound Dressing Wound #1 Left,Medial Lower Leg o Mepitel One Contact layer o Exufiber Ag+ Secondary Dressing Wound #1 Left,Medial Lower Leg o ABD pad Dressing Change Frequency Wound #1 Left,Medial Lower Leg o Change dressing every week Follow-up Appointments Wound #1 Left,Medial Lower Leg o Return Appointment in 1 week. o Nurse Visit as needed Edema Control Wound #1 Left,Medial Lower Leg o Kerlix and Coban - Left Lower Extremity [name] [name], [name] [name] ([id number]) Electronic  Signature(s) Signed: 06/05/2016 4:21:54 PM By: Christin Fudge MD, FACS Signed: 06/05/2016 5:25:14 PM By: Montey Hora Entered By: Montey Hora on 06/05/2016 14:27:05 [name] [name] ([id number]) -------------------------------------------------------------------------------- Problem List Details Patient Name: [name] [name]. Date of Service: 06/05/2016 1:15 PM Medical Record Number: [id number] Patient Account Number: [id number] Date of Birth/Sex: 02-14-1976 (41 y.o. Male) Treating RN: Montey Hora Primary Care Provider: Kasandra Knudsen Other Clinician: Referring Provider: Kasandra Knudsen Treating Provider/Extender: Frann Rider in Treatment: 0 Active Problems ICD-10 Encounter Code Description Active Date Diagnosis E11.622 Type 2 diabetes mellitus with other skin ulcer 06/05/2016 Yes Z99.2 Dependence on renal dialysis 06/05/2016 Yes L97.221 Non-pressure chronic ulcer of left calf limited to 06/05/2016 Yes breakdown of skin Inactive Problems Resolved Problems Electronic Signature(s) Signed: 06/05/2016 2:30:09 PM By: Christin Fudge MD, FACS Previous Signature: 06/05/2016 1:43:29 PM Version By: Christin Fudge MD, FACS Previous Signature: 06/05/2016 1:42:55 PM Version By: Christin Fudge MD, FACS Entered By: Christin Fudge on 06/05/2016 14:30:09 [name] [name] ([id number]) -------------------------------------------------------------------------------- Progress Note Details Patient Name: [name] [name]. Date of Service: 06/05/2016 1:15 PM Medical Record Number: [id number] Patient Account Number: [id number] Date of Birth/Sex: Sep 25, 1975 (41 y.o. Male) Treating RN: Montey Hora Primary Care Provider: Kasandra Knudsen Other Clinician: Referring Provider: Kasandra Knudsen Treating Provider/Extender: Frann Rider in Treatment: 0 Subjective Chief Complaint Information obtained from Patient Patients presents for treatment of an open diabetic ulcer the left lower extremity for 5  days History of Present Illness (HPI) The following HPI elements were documented for the patient's wound: Location: left lower extremity Quality: Patient reports experiencing a dull pain to affected area(s). Severity: Patient states wound (s) are getting better. Duration: Patient has had the wound for < 2 weeks prior to presenting for treatment Timing: Pain in wound is Intermittent (comes and goes Context: The wound appeared gradually over time Modifying Factors: Other treatment(s) tried include:local care and oral antibiotic Associated Signs and Symptoms: Patient reports having increase swelling. 41 year old gentleman who has noticed a blister on his left lower extremity which came up 5 days ago and it has drained fluid. He was seen by hispractitioner who reviewed him and put him on doxycycline now asked him to come to the wound center. His past medical history is significant for diabetes mellitus type 2, congestive heart failure, renal failure, hypertension, and he is receiving hemodialysis 3 times a week. He  has never been a smoker. Wound History Patient presents with 1 open wound that has been present for approximately 2 days. Patient has been treating wound in the following manner: dry dressing. Laboratory tests have been performed in the last month. Patient reportedly has not tested positive for an antibiotic resistant organism. Patient reportedly has not tested positive for osteomyelitis. Patient reportedly has not had testing performed to evaluate circulation in the legs. Patient History Information obtained from Patient. Allergies lisinopril Social History [name] [name], [name] [name] ([id number]) Never smoker, Marital Status - Single, Alcohol Use - Never, Drug Use - No History, Caffeine Use - Rarely. Medical History Cardiovascular Patient has history of Hypertension Endocrine Patient has history of Type II Diabetes Oncologic Denies history of Received Chemotherapy, Received  Radiation Patient is treated with Insulin. Blood sugar is not tested. Review of Systems (ROS) Constitutional Symptoms (General Health) The patient has no complaints or symptoms. Eyes Complains or has symptoms of Glasses / Contacts - glasses. Ear/Nose/Mouth/Throat The patient has no complaints or symptoms. Hematologic/Lymphatic The patient has no complaints or symptoms. Respiratory The patient has no complaints or symptoms. Cardiovascular Complains or has symptoms of LE edema. Gastrointestinal The patient has no complaints or symptoms. Endocrine The patient has no complaints or symptoms. Genitourinary Complains or has symptoms of Kidney failure/ Dialysis - ESRD on HD. Immunological The patient has no complaints or symptoms. Integumentary (Skin) The patient has no complaints or symptoms. Musculoskeletal The patient has no complaints or symptoms. Neurologic The patient has no complaints or symptoms. Oncologic The patient has no complaints or symptoms. Psychiatric The patient has no complaints or symptoms. Medications carvedilol 25 mg tablet oral tablet oral warfarin 10 mg tablet oral tablet oral [name] [name], [name] [name] ([id number]) warfarin 4 mg tablet oral tablet oral losartan 100 mg tablet oral tablet oral hydralazine 50 mg tablet oral tablet oral Sensipar 60 mg tablet oral tablet oral amlodipine 10 mg tablet oral tablet oral Fosrenol 1,000 mg chewable tablet oral tablet,chewable oral Humulin 70/30 100 unit/mL subcutaneous suspension subcutaneous suspension subcutaneous furosemide 40 mg tablet oral tablet oral Objective Constitutional Pulse regular. Respirations normal and unlabored. Afebrile. Vitals Time Taken: 1:43 PM, Height: 73 in, Source: Measured, Weight: 285 lbs, Source: Measured, BMI: 37.6, Temperature: 98.1 F, Pulse: 82 bpm, Respiratory Rate: 16 breaths/min, Blood Pressure: 141/81 mmHg. Eyes Nonicteric. Reactive to light. Ears, Nose, Mouth, and Throat Lips,  teeth, and gums WNL.Marland Kitchen Moist mucosa without lesions. Neck supple and nontender. No palpable supraclavicular or cervical adenopathy. Normal sized without goiter. Respiratory WNL. No retractions.. Cardiovascular Pedal Pulses WNL. the ABI on the left lower extremity is 1.28. minimal lymphedema left lower extremity. Chest Breasts symmetical and no nipple discharge.. Breast tissue WNL, no masses, lumps, or tenderness.. Gastrointestinal (GI) Abdomen without masses or tenderness.. No liver or spleen enlargement or tenderness.. Lymphatic No adneopathy. No adenopathy. No adenopathy. Musculoskeletal Adexa without tenderness or enlargement.. Digits and nails w/o clubbing, cyanosis, infection, petechiae, ischemia, or inflammatory conditions.Marland Kitchen [name] [name], [name] [name] Kitchen ([id number]) Psychiatric Judgement and insight Intact.. No evidence of depression, anxiety, or agitation.. General Notes: the left lower extremity has a large superficial blister with the skin retracted and using a toothed forcep, I was able to pull the skin down to cover most of the denuded area and we will leave this in place. Integumentary (Hair, Skin) No suspicious lesions. No crepitus or fluctuance. No peri-wound warmth or erythema. No masses.. Wound #1 status is Open. Original cause of wound was Blister. The wound is located on the Left,Medial Lower Leg.  The wound measures 8cm length x 11.5cm width x 0.1cm depth; 72.257cm^2 area and 7.226cm^3 volume. The wound is limited to skin breakdown. There is no tunneling or undermining noted. There is a large amount of serous drainage noted. The wound margin is flat and intact. There is large (67- 100%) red granulation within the wound bed. There is no necrotic tissue within the wound bed. The periwound skin appearance exhibited: Erythema. The periwound skin appearance did not exhibit: Callus, Crepitus, Excoriation, Induration, Rash, Scarring, Dry/Scaly, Maceration, Atrophie Blanche,  Cyanosis, Ecchymosis, Hemosiderin Staining, Mottled, Pallor, Rubor. The surrounding wound skin color is noted with erythema which is circumferential. Periwound temperature was noted as No Abnormality. The periwound has tenderness on palpation. 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 After review I have recommended: 1. Local care with a nonadherent Mepitel followed by silver alginate and a light Kerlix and Coban wrap. 2. Elevation and exercise 3. Good control of his diabetes mellitus 4. Review neck streak in the wound center Plan [name] [name], [name] [name]. ([id number]) Wound Cleansing: Wound #1 Left,Medial Lower Leg: Clean wound with Normal Saline. Anesthetic: Wound #1 Left,Medial Lower Leg: Topical Lidocaine 4% cream applied to wound bed prior to debridement Primary Wound Dressing: Wound #1 Left,Medial Lower Leg: Mepitel One Contact layer Exufiber Ag+ Secondary Dressing: Wound #1 Left,Medial Lower Leg: ABD pad Dressing Change Frequency: Wound #1 Left,Medial Lower Leg: Change dressing every week Follow-up Appointments: Wound #1 Left,Medial Lower Leg: Return Appointment in 1 week. Nurse Visit as needed Edema Control: Wound #1 Left,Medial Lower Leg: Kerlix and Coban - Left Lower Extremity this 41 year old diabetic who is also had renal failure and is hemodialysis dependent developed a large blister of the left lower extremity possibly due to fluid overload. After review I have recommended: 1. Local care with a nonadherent Mepitel followed by silver alginate and a light Kerlix and Coban wrap. 2. Elevation and exercise 3. Good control of his diabetes  mellitus 4. Review neck streak in the wound center Electronic Signature(s) Signed: 06/05/2016 2:37:32 PM By: Christin Fudge MD, FACS Entered By: Christin Fudge on 06/05/2016 14:37:32 [name] [name] ([id number]) -------------------------------------------------------------------------------- ROS/PFSH Details Patient Name: [name] [name]. Date of Service: 06/05/2016 1:15 PM Medical Record Number: [id number] Patient Account Number: [id number] Date of Birth/Sex: Jan 22, 1976 (40 y.o. Male) Treating RN: Montey Hora Primary Care Provider: Kasandra Knudsen Other Clinician: Referring Provider: Kasandra Knudsen Treating Provider/Extender: Frann Rider in Treatment: 0 Information Obtained From Patient Wound History Do you currently have one or more open woundso Yes How many open wounds do you currently haveo 1 Approximately how long have you had your woundso 2 days How have you been treating your wound(s) until nowo dry dressing Has your wound(s) ever healed and then re-openedo No Have you had any lab work done in the past montho Yes Who ordered the lab work doneo HD Have you tested positive for an antibiotic resistant organism (MRSA, VRE)o No Have you tested positive for osteomyelitis (bone infection)o No Have you had any tests for circulation on your legso No Eyes Complaints and Symptoms: Positive for: Glasses / Contacts - glasses Cardiovascular Complaints and Symptoms: Positive for: LE edema Medical History: Positive for: Hypertension Genitourinary Complaints and  Symptoms: Positive for: Kidney failure/ Dialysis - ESRD on HD Constitutional Symptoms (General Health) Complaints and Symptoms: No Complaints or Symptoms Ear/Nose/Mouth/Throat Complaints and Symptoms: No Complaints or Symptoms FEDOR, [name] [name] ([id number]) Hematologic/Lymphatic Complaints and Symptoms: No Complaints or Symptoms Respiratory Complaints and Symptoms: No Complaints or  Symptoms Gastrointestinal Complaints and Symptoms: No Complaints or Symptoms Endocrine Complaints and Symptoms: No Complaints or Symptoms Medical History: Positive for: Type II Diabetes Time with diabetes: since 2001 Treated with: Insulin Blood sugar tested every day: No Immunological Complaints and Symptoms: No Complaints or Symptoms Integumentary (Skin) Complaints and Symptoms: No Complaints or Symptoms Musculoskeletal Complaints and Symptoms: No Complaints or Symptoms Neurologic Complaints and Symptoms: No Complaints or Symptoms Oncologic Complaints and Symptoms: No Complaints or Symptoms Medical HistoryMarland Kitchen [name] [name], [name] [name] ([id number]) Negative for: Received Chemotherapy; Received Radiation Psychiatric Complaints and Symptoms: No Complaints or Symptoms Immunizations Pneumococcal Vaccine: Received Pneumococcal Vaccination: No Immunization Notes: up to date Family and Social History Never smoker; Marital Status - Single; Alcohol Use: Never; Drug Use: No History; Caffeine Use: Rarely; Financial Concerns: No; Food, Clothing or Shelter Needs: No; Support System Lacking: No; Transportation Concerns: No; Advanced Directives: No; Patient does not want information on Advanced Directives Physician Affirmation I have reviewed and agree with the above information. Electronic Signature(s) Signed: 06/05/2016 4:21:54 PM By: Christin Fudge MD, FACS Signed: 06/05/2016 5:25:14 PM By: Montey Hora Entered By: Christin Fudge on 06/05/2016 14:19:55 [name] [name] ([id number]) -------------------------------------------------------------------------------- SuperBill Details Patient Name: [name] [name]. Date of Service: 06/05/2016 Medical Record Number: [id number] Patient Account Number: [id number] Date of Birth/Sex: 03-Sep-1975 (41 y.o. 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 Facility Procedures CPT4 Code: [id number] Description: 99214 - WOUND CARE VISIT-LEV 4 EST PT Modifier: Quantity: 1 Physician Procedures CPT4 Code Description: [id number] - WC PHYS LEVEL 4 - NEW PT ICD-10 Description Diagnosis E11.622 Type 2 diabetes mellitus with other skin ulcer Z99.2 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

## 2016-06-12 ENCOUNTER — Encounter: Payer: Medicare Other | Admitting: Surgery

## 2016-06-12 DIAGNOSIS — E11622 Type 2 diabetes mellitus with other skin ulcer: Secondary | ICD-10-CM | POA: Diagnosis not present

## 2016-06-13 NOTE — Progress Notes (Signed)
LEANDRO, BERKOWITZ ([id number]) Visit Report for 06/12/2016 Arrival Information Details Patient Name: [name] [name], [name] [name]. Date of Service: 06/12/2016 11:00 AM Medical Record Number: [id number] Patient Account Number: [id number] Date of Birth/Sex: 03-29-1976 (41 y.o. Male) Treating RN: Montey Hora Primary Care Velencia Lenart: Kasandra Knudsen Other Clinician: Referring Nyari Olsson: Kasandra Knudsen Treating Lavaya Defreitas/Extender: Frann Rider in Treatment: 1 Visit Information History Since Last Visit Added or deleted any medications: No Patient Arrived: Ambulatory Any new allergies or adverse reactions: No Arrival Time: 11:17 Had a fall or experienced change in No Accompanied By: self activities of daily living that may affect Transfer Assistance: None risk of falls: Patient Identification Verified: Yes Signs or symptoms of abuse/neglect since last No Secondary Verification Process Yes visito Completed: Hospitalized since last visit: No Patient Has Alerts: Yes Has Dressing in Place as Prescribed: Yes Patient Alerts: Patient on Blood Has Compression in Place as Prescribed: Yes Thinner Pain Present Now: No DMII Electronic Signature(s) Signed: 06/12/2016 5:12:01 PM By: Montey Hora Entered By: Montey Hora on 06/12/2016 11:17:31 [name] [name] ([id number]) -------------------------------------------------------------------------------- Encounter Discharge Information Details Patient Name: [name] [name]. Date of Service: 06/12/2016 11:00 AM Medical Record Number: [id number] Patient Account Number: [id number] Date of Birth/Sex: [birth year] (41 y.o. Male) Treating RN: Montey Hora Primary Care Mylene Bow: Kasandra Knudsen Other Clinician: Referring Iram Astorino: Kasandra Knudsen Treating Marithza Malachi/Extender: Frann Rider in Treatment: 1 Encounter Discharge Information Items Discharge Pain Level: 0 Discharge Condition: Stable Ambulatory Status: Ambulatory Discharge  Destination: Home Transportation: Private Auto Accompanied By: self Schedule Follow-up Appointment: Yes Medication Reconciliation completed and provided to Patient/Care No Ivonna Kinnick: Provided on Clinical Summary of Care: 06/12/2016 Form Type Recipient Paper Patient LT Electronic Signature(s) Signed: 06/12/2016 2:07:05 PM By: Montey Hora Previous Signature: 06/12/2016 11:59:23 AM Version By: Ruthine Dose Entered By: Montey Hora on 06/12/2016 14:07:05 [name] [name] ([id number]) -------------------------------------------------------------------------------- General Visit Notes Details Patient Name: [name] [name]. Date of Service: 06/12/2016 11:00 AM Medical Record Number: [id number] Patient Account Number: [id number] Date of Birth/Sex: Aug 17, 1975 (41 y.o. Male) Treating RN: Montey Hora Primary Care Dryden Tapley: Kasandra Knudsen Other Clinician: Referring Timarion Agcaoili: Kasandra Knudsen Treating Khadejah Son/Extender: Frann Rider in Treatment: 1 Notes Patient refused face photo Electronic Signature(s) Signed: 06/12/2016 2:06:14 PM By: Montey Hora Entered By: Montey Hora on 06/12/2016 14:06:14 [name] [name] ([id number]) -------------------------------------------------------------------------------- Lower Extremity Assessment Details Patient Name: [name] [name]. Date of Service: 06/12/2016 11:00 AM Medical Record Number: [id number] Patient Account Number: [id number] Date of Birth/Sex: [birth year] (41 y.o. Male) Treating RN: Montey Hora Primary Care Darinda [place]: Kasandra Knudsen Other Clinician: Referring Ashlan Dignan: Kasandra Knudsen Treating Najmah Carradine/Extender: Frann Rider in Treatment: 1 Edema Assessment Assessed: [Left: No] [Right: No] E[Left: dema] [Right: :] Calf Left: Right: Point of Measurement: 36 cm From Medial Instep 36.8 cm cm Ankle Left: Right: Point of Measurement: 12 cm From Medial Instep 22.9 cm cm Vascular Assessment Pulses: Dorsalis  Pedis Palpable: [Left:Yes] Posterior Tibial Extremity colors, hair growth, and conditions: Extremity Color: [Left:Normal] Hair Growth on Extremity: [Left:Yes] Temperature of Extremity: [Left:Warm] Capillary Refill: [Left:< 3 seconds] Electronic Signature(s) Signed: 06/12/2016 5:12:01 PM By: Montey Hora Entered By: Montey Hora on 06/12/2016 11:31:14 [name] [name] ([id number]) -------------------------------------------------------------------------------- Multi Wound Chart Details Patient Name: [name] [name]. Date of Service: 06/12/2016 11:00 AM Medical Record Number: [id number] Patient Account Number: [id number] Date of Birth/Sex: February 15, 1976 (41 y.o. 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 N/A N/A Comorbid History: Hypertension, Type II N/A N/A Diabetes Date Acquired: 06/03/2016 N/A N/A Weeks of Treatment: 1 N/A N/A Wound Status: Open N/A N/A Measurements L x W x D 7.6x8x0.1 N/A N/A (cm) Area (cm) : 47.752 N/A N/A Volume (cm) : 4.775 N/A N/A % Reduction in Area: 33.90% N/A N/A % Reduction in Volume: 33.90% N/A N/A Classification: Grade 1 N/A N/A Exudate Amount: Large N/A N/A Exudate Type: Serous N/A N/A Exudate Color: amber N/A N/A Wound Margin: Flat and Intact N/A N/A Granulation Amount: Large (67-100%) N/A N/A Granulation Quality: Red N/A N/A [name] [name], [name] [name] ([id number]) Necrotic Amount: Small (1-33%) N/A N/A Exposed  Structures: Fascia: No N/A N/A Fat Layer (Subcutaneous Tissue) Exposed: No Tendon: No Muscle: No Joint: No Bone: No Limited to Skin Breakdown Epithelialization: None N/A N/A Debridement: Debridement (52841- N/A N/A 11047) Pre-procedure 11:38 N/A N/A Verification/Time Out Taken: Pain Control: Lidocaine 4% Topical N/A N/A Solution Tissue Debrided: Fibrin/Slough, Skin, N/A N/A Subcutaneous Level: Skin/Subcutaneous N/A N/A Tissue Debridement Area (sq 60.8 N/A N/A cm): Instrument: Forceps, Scissors N/A N/A Bleeding: Minimum N/A N/A Hemostasis Achieved: Pressure N/A N/A Procedural Pain: 0 N/A N/A Post Procedural Pain: 0 N/A N/A Debridement Treatment Procedure was tolerated N/A N/A Response: well Post Debridement 7.6x8x0.1 N/A N/A Measurements L x W x D (cm) Post Debridement 4.775 N/A N/A Volume: (cm) Periwound Skin Texture: Excoriation: No N/A N/A Induration: No Callus: No Crepitus: No Rash: No Scarring: No Periwound Skin Maceration: No N/A N/A Moisture: Dry/Scaly: No Periwound Skin Color: Erythema: Yes N/A N/A Atrophie Blanche: No Cyanosis: No Ecchymosis: No Hemosiderin Staining: No Mottled: No [name] [name], [name] [name] ([id number]) Pallor: No Rubor: No Erythema Location: Circumferential N/A N/A Temperature: No Abnormality N/A N/A Tenderness on Yes N/A N/A Palpation: Wound Preparation: Ulcer Cleansing: N/A N/A Rinsed/Irrigated with Saline Topical Anesthetic Applied: Other: lidocaine 4% Procedures Performed: Debridement N/A N/A Treatment Notes Electronic Signature(s) Signed: 06/12/2016 12:05:13 PM By: Christin Fudge MD, FACS Entered By: Christin Fudge on 06/12/2016 12:05:13 [name] [name] ([id number]) -------------------------------------------------------------------------------- [place] Details Patient Name: [name] [name]. Date of Service: 06/12/2016 11:00 AM Medical Record Number: [id number] Patient Account Number: [id number] Date  of Birth/Sex: 10/21/1975 (41 y.o. Male) Treating RN: Montey Hora Primary Care Nikko Goldwire: Kasandra Knudsen Other Clinician: Referring Wyland Rastetter: Kasandra Knudsen Treating Emma-Lee Oddo/Extender: Frann Rider in Treatment: 1 Active Inactive ` Orientation to the Wound Care Program Nursing Diagnoses: Knowledge deficit related to the wound healing center program Goals: Patient/caregiver will verbalize understanding of the [place] Program Date Initiated: 06/05/2016 Target Resolution Date: 08/29/2016 Goal Status: Active Interventions: Provide education on orientation to the wound center Notes: ` Wound/Skin Impairment Nursing Diagnoses: Knowledge deficit related to ulceration/compromised skin integrity Goals: Patient/caregiver will verbalize understanding of skin care regimen Date Initiated: 06/05/2016 Target Resolution Date: 08/29/2016 Goal Status: Active Ulcer/skin breakdown will have a volume reduction of 30% by week 4 Date Initiated: 06/05/2016 Target Resolution Date: 08/29/2016 Goal Status: Active Ulcer/skin breakdown will have a volume reduction of 50% by week 8 Date Initiated: 06/05/2016 Target Resolution Date: 08/29/2016 Goal Status: Active Ulcer/skin breakdown will have a volume reduction of 80% by week 12 Date Initiated: 06/05/2016 Target Resolution Date: 08/29/2016 Goal Status: Active Ulcer/skin breakdown will heal within 14  weeks Date Initiated: 06/05/2016 Target Resolution Date: 08/29/2016 [name] [name], [name] [name] ([id number]) Goal Status: Active Interventions: Assess patient/caregiver ability to obtain necessary supplies Assess patient/caregiver ability to perform ulcer/skin care regimen upon admission and as needed Assess ulceration(s) every visit Notes: Electronic Signature(s) Signed: 06/12/2016 5:12:01 PM By: Montey Hora Entered By: Montey Hora on 06/12/2016 11:38:17 [name] [name]  ([id number]) -------------------------------------------------------------------------------- Pain Assessment Details Patient Name: [name] [name]. Date of Service: 06/12/2016 11:00 AM Medical Record Number: [id number] Patient Account Number: [id number] Date of Birth/Sex: 07/09/1975 (41 y.o. Male) Treating RN: Montey Hora Primary Care Chez Bulnes: Kasandra Knudsen Other Clinician: Referring Murle Otting: Kasandra Knudsen Treating Lenix Benoist/Extender: Frann Rider in Treatment: 1 Active Problems Location of Pain Severity and Description of Pain Patient Has Paino No Site Locations Pain Management and Medication Current Pain Management: Notes Topical or injectable lidocaine is offered to patient for acute pain when surgical debridement is performed. If needed, Patient is instructed to use over the counter pain medication for the following 24-48 hours after debridement. Wound care MDs do not prescribed pain medications. Patient has chronic pain or uncontrolled pain. Patient has been instructed to make an appointment with their Primary Care Physician for pain management. Electronic Signature(s) Signed: 06/12/2016 5:12:01 PM By: Montey Hora Entered By: Montey Hora on 06/12/2016 11:17:40 [name] [name] ([id number]) -------------------------------------------------------------------------------- Patient/Caregiver Education Details Patient Name: [name] [name]. Date of Service: 06/12/2016 11:00 AM Medical Record Number: [id number] Patient Account Number: [id number] Date of Birth/Gender: 09-06-1975 (41 y.o. Male) Treating RN: Montey Hora Primary Care Physician: Kasandra Knudsen Other Clinician: Referring Physician: Kasandra Knudsen Treating Physician/Extender: Frann Rider in Treatment: 1 Education Assessment Education Provided To: Patient Education Topics Provided Wound/Skin Impairment: Handouts: Other: how to shower with wrap Methods: Explain/Verbal Responses:  State content correctly Electronic Signature(s) Signed: 06/12/2016 5:12:01 PM By: Montey Hora Entered By: Montey Hora on 06/12/2016 14:07:38 [name] [name] ([id number]) -------------------------------------------------------------------------------- Wound Assessment Details Patient Name: [name] [name]. Date of Service: 06/12/2016 11:00 AM Medical Record Number: [id number] Patient Account Number: [id number] Date of Birth/Sex: February 09, 1976 (41 y.o. Male) Treating RN: Montey Hora Primary Care Adelyn Roscher: Kasandra Knudsen Other Clinician: Referring Vrishank Moster: Kasandra Knudsen Treating Steaven Wholey/Extender: Frann Rider in Treatment: 1 Wound Status Wound Number: 1 Primary Etiology: Diabetic Wound/Ulcer of the Lower Extremity Wound Location: Left Lower Leg - Medial Secondary Venous Leg Ulcer Wounding Event: Blister Etiology: Date Acquired: 06/03/2016 Wound Status: Open Weeks Of Treatment: 1 Comorbid Hypertension, Type II Diabetes Clustered Wound: No History: Photos Wound Measurements Length: (cm) 7.6 Width: (cm) 8 Depth: (cm) 0.1 Area: (cm) 47.752 Volume: (cm) 4.775 % Reduction in Area: 33.9% % Reduction in Volume: 33.9% Epithelialization: None Tunneling: No Undermining: No Wound Description Classification: Grade 1 Wound Margin: Flat and Intact Exudate Amount: Large Exudate Type: Serous Exudate Color: amber Foul Odor After Cleansing: No Slough/Fibrino No Wound Bed Granulation Amount: Large (67-100%) Exposed Structure Granulation Quality: Red Fascia Exposed: No Necrotic Amount: Small (1-33%) Fat Layer (Subcutaneous Tissue) Exposed: No Necrotic Quality: Adherent Slough Tendon Exposed: No Muscle Exposed: No [name] [name], [name] [name] ([id number]) Joint Exposed: No Bone Exposed: No Limited to Skin Breakdown Periwound Skin Texture Texture Color No Abnormalities Noted: No No Abnormalities Noted: No Callus: No Atrophie Blanche: No Crepitus: No Cyanosis:  No Excoriation: No Ecchymosis: No Induration: No Erythema: Yes Rash: No Erythema Location: Circumferential Scarring: No Hemosiderin Staining: No Mottled: No Moisture Pallor: No No Abnormalities Noted: No Rubor: No Dry / Scaly: No Maceration: No Temperature / Pain Temperature: No Abnormality Tenderness on Palpation: Yes Wound  Preparation Ulcer Cleansing: Rinsed/Irrigated with Saline Topical Anesthetic Applied: Other: lidocaine 4%, Treatment Notes Wound #1 (Left, Medial Lower Leg) 1. Cleansed with: Cleanse wound with antibacterial soap and water 2. Anesthetic Topical Lidocaine 4% cream to wound bed prior to debridement 4. Dressing Applied: Aquacel Ag Mepitel 5. Secondary Dressing Applied ABD Pad 7. Secured with Other (specify in notes) Notes kerlix and coban wrap Electronic Signature(s) Signed: 06/12/2016 5:12:01 PM By: Montey Hora Entered By: Montey Hora on 06/12/2016 11:37:25 [name] [name] ([id number]) -------------------------------------------------------------------------------- [place] Details Patient Name: [name] [name]. Date of Service: 06/12/2016 11:00 AM Medical Record Number: [id number] Patient Account Number: [id number] Date of Birth/Sex: 02/23/1976 (41 y.o. Male) Treating RN: Montey Hora Primary Care Bayyinah Dukeman: Kasandra Knudsen Other Clinician: Referring Lorilyn Laitinen: Kasandra Knudsen Treating Azlaan Isidore/Extender: Frann Rider in Treatment: 1 Vital Signs Time Taken: 11:17 Temperature (F): 98.7 Height (in): 73 Pulse (bpm): 79 Weight (lbs): 285 Respiratory Rate (breaths/min): 16 Body Mass Index (BMI): 37.6 Blood Pressure (mmHg): 134/80 Reference Range: 80 - 120 mg / dl Electronic Signature(s) Signed: 06/12/2016 5:12:01 PM By: Montey Hora Entered By: Montey Hora on 06/12/2016 11:19:32

## 2016-06-13 NOTE — Progress Notes (Signed)
RINALDO, MACQUEEN ([id number]) Visit Report for 06/12/2016 Chief Complaint Document Details Patient Name: [name] [name], [name] [name]. Date of Service: 06/12/2016 11:00 AM Medical Record Number: [id number] Patient Account Number: [id number] Date of Birth/Sex: 07/20/1975 (40 y.o. Male) Treating RN: Montey Hora Primary Care Provider: Kasandra Knudsen Other Clinician: Referring Provider: Kasandra Knudsen Treating Provider/Extender: Frann Rider in Treatment: 1 Information Obtained from: Patient Chief Complaint Patients presents for treatment of an open diabetic ulcer the left lower extremity for 5 days Electronic Signature(s) Signed: 06/12/2016 12:05:39 PM By: Christin Fudge MD, FACS Entered By: Christin Fudge on 06/12/2016 12:05:39 [name] [name] ([id number]) -------------------------------------------------------------------------------- Debridement Details Patient Name: [name] [name]. Date of Service: 06/12/2016 11:00 AM Medical Record Number: [id number] Patient Account Number: [id number] Date of Birth/Sex: 1976/04/18 (41 y.o. Male) Treating RN: Montey Hora Primary Care Provider: Kasandra Knudsen Other Clinician: Referring Provider: Kasandra Knudsen Treating Provider/Extender: Frann Rider in Treatment: 1 Debridement Performed for Wound #1 Left,Medial Lower Leg Assessment: Performed By: Physician Christin Fudge, MD Debridement: Debridement Pre-procedure Yes - 11:38 Verification/Time Out Taken: Start Time: 11:38 Pain Control: Lidocaine 4% Topical Solution Level: Skin/Subcutaneous Tissue Total Area Debrided (L x 3 (cm) x 2 (cm) = 6 (cm) W): Tissue and other Viable, Non-Viable, Fibrin/Slough, Skin, Subcutaneous material debrided: Instrument: Forceps, Scissors Bleeding: Minimum Hemostasis Achieved: Pressure End Time: 11:40 Procedural Pain: 0 Post Procedural Pain: 0 Response to Treatment: Procedure was tolerated well Post Debridement Measurements of Total  Wound Length: (cm) 7.6 Width: (cm) 8 Depth: (cm) 0.1 Volume: (cm) 4.775 Character of Wound/Ulcer Post Improved Debridement: Severity of Tissue Post Debridement: Fat layer exposed Post Procedure Diagnosis Same as Pre-procedure Electronic Signature(s) Signed: 06/12/2016 12:05:33 PM By: Christin Fudge MD, FACS Signed: 06/12/2016 5:12:01 PM By: Montey Hora Entered By: Christin Fudge on 06/12/2016 12:05:32 [name] [name] ([id number]) [name] [name] ([id number]) -------------------------------------------------------------------------------- HPI Details Patient Name: [name] [name]. Date of Service: 06/12/2016 11:00 AM Medical Record Number: [id number] Patient Account Number: [id number] Date of Birth/Sex: [birth year] (41 y.o. Male) Treating RN: Montey Hora Primary Care Provider: Kasandra Knudsen Other Clinician: Referring Provider: Kasandra Knudsen Treating Provider/Extender: Frann Rider in Treatment: 1 History of Present Illness Location: left lower extremity Quality: Patient reports experiencing a dull pain to affected area(s). Severity: Patient states wound (s) are getting better. Duration: Patient has had the wound for < 2 weeks prior to presenting for treatment Timing: Pain in wound is Intermittent (comes and goes Context: The wound appeared gradually over time Modifying Factors: Other treatment(s) tried include:local care and oral antibiotic Associated Signs and Symptoms: Patient reports having increase swelling. HPI Description: 41 year old gentleman who has noticed a blister on his left lower extremity which came up 5 days ago and it has drained fluid. He was seen by hispractitioner who reviewed him and put him on doxycycline now asked him to come to the wound center. His past medical history is significant for diabetes mellitus type 2, congestive heart failure, renal failure, hypertension, and he is receiving hemodialysis 3 times a week. He has never been a  smoker.Marland Kitchen 06/12/2016 -- the patient has in continue with hemodialysis and no changes have occurred in the last week Electronic Signature(s) Signed: 06/12/2016 12:06:10 PM By: Christin Fudge MD, FACS Entered By: Christin Fudge on 06/12/2016 12:06:10 [name] [name] ([id number]) -------------------------------------------------------------------------------- Physical Exam Details Patient Name: [name] [name]. Date of Service: 06/12/2016 11:00 AM Medical Record Number: [id number] Patient Account Number: [id number] Date of Birth/Sex: [birth year] (41 y.o. Male) Treating RN: Montey Hora Primary Care Provider: Matthew Saras,  CHELSA Other Clinician: Referring Provider: Kasandra Knudsen Treating Provider/Extender: Frann Rider in Treatment: 1 Constitutional . Pulse regular. Respirations normal and unlabored. Afebrile. . Eyes Nonicteric. Reactive to light. Ears, Nose, Mouth, and Throat Lips, teeth, and gums WNL.Marland Kitchen Moist mucosa without lesions. Neck supple and nontender. No palpable supraclavicular or cervical adenopathy. Normal sized without goiter. Respiratory WNL. No retractions.. Cardiovascular Pedal Pulses WNL. No clubbing, cyanosis or edema. Gastrointestinal (GI) Abdomen without masses or tenderness.. No liver or spleen enlargement or tenderness.. Lymphatic No adneopathy. No adenopathy. No adenopathy. Musculoskeletal Adexa without tenderness or enlargement.. Digits and nails w/o clubbing, cyanosis, infection, petechiae, ischemia, or inflammatory conditions.. Integumentary (Hair, Skin) No suspicious lesions. No crepitus or fluctuance. No peri-wound warmth or erythema. No masses.Marland Kitchen Psychiatric Judgement and insight Intact.. No evidence of depression, anxiety, or agitation.. Notes partially the skin over the blister has remained intact but there is a lot of loose tissue and subcutaneous tissue which need sharp debridement with the forceps and scissors and I have done this. No  bleeding. Electronic Signature(s) Signed: 06/12/2016 12:06:58 PM By: Christin Fudge MD, FACS Entered By: Christin Fudge on 06/12/2016 12:06:58 [name] [name] ([id number]) -------------------------------------------------------------------------------- Physician Orders Details Patient Name: [name] [name]. Date of Service: 06/12/2016 11:00 AM Medical Record Number: [id number] Patient Account Number: [id number] Date of Birth/Sex: [birth year] (41 y.o. Male) Treating RN: Montey Hora Primary Care Provider: Kasandra Knudsen Other Clinician: Referring Provider: Kasandra Knudsen Treating Provider/Extender: Frann Rider in Treatment: 1 Verbal / Phone Orders: Yes Clinician: Montey Hora Read Back and Verified: Yes Diagnosis Coding Wound Cleansing Wound #1 Left,Medial Lower Leg o Clean wound with Normal Saline. Anesthetic Wound #1 Left,Medial Lower Leg o Topical Lidocaine 4% cream applied to wound bed prior to debridement Primary Wound Dressing Wound #1 Left,Medial Lower Leg o Mepitel One Contact layer o Exufiber Ag+ Secondary Dressing Wound #1 Left,Medial Lower Leg o ABD pad Dressing Change Frequency Wound #1 Left,Medial Lower Leg o Change dressing every week Follow-up Appointments Wound #1 Left,Medial Lower Leg o Return Appointment in 1 week. o Nurse Visit as needed Edema Control Wound #1 Left,Medial Lower Leg o Kerlix and Coban - Left Lower Extremity Electronic Signature(s) Signed: 06/12/2016 4:07:08 PM By: Christin Fudge MD, FACS Signed: 06/12/2016 5:12:01 PM By: Horald Pollen ([id number]) Entered By: Montey Hora on 06/12/2016 11:40:55 [name] [name] ([id number]) -------------------------------------------------------------------------------- Problem List Details Patient Name: HERMES, WAFER. Date of Service: 06/12/2016 11:00 AM Medical Record Number: [id number] Patient Account Number: [id number] Date of Birth/Sex:  1975/06/21 (41 y.o. Male) Treating RN: Montey Hora Primary Care Provider: Kasandra Knudsen Other Clinician: Referring Provider: Kasandra Knudsen Treating Provider/Extender: Frann Rider in Treatment: 1 Active Problems ICD-10 Encounter Code Description Active Date Diagnosis E11.622 Type 2 diabetes mellitus with other skin ulcer 06/05/2016 Yes Z99.2 Dependence on renal dialysis 06/05/2016 Yes L97.221 Non-pressure chronic ulcer of left calf limited to 06/05/2016 Yes breakdown of skin Inactive Problems Resolved Problems Electronic Signature(s) Signed: 06/12/2016 12:05:07 PM By: Christin Fudge MD, FACS Entered By: Christin Fudge on 06/12/2016 12:05:07 [name] [name] ([id number]) -------------------------------------------------------------------------------- Progress Note Details Patient Name: [name] [name]. Date of Service: 06/12/2016 11:00 AM Medical Record Number: [id number] Patient Account Number: [id number] Date of Birth/Sex: [birth year] (41 y.o. Male) Treating RN: Montey Hora Primary Care Provider: Kasandra Knudsen Other Clinician: Referring Provider: Kasandra Knudsen Treating Provider/Extender: Frann Rider in Treatment: 1 Subjective Chief Complaint Information obtained from Patient Patients presents for treatment of an open diabetic ulcer the left lower extremity for 5 days  History of Present Illness (HPI) The following HPI elements were documented for the patient's wound: Location: left lower extremity Quality: Patient reports experiencing a dull pain to affected area(s). Severity: Patient states wound (s) are getting better. Duration: Patient has had the wound for < 2 weeks prior to presenting for treatment Timing: Pain in wound is Intermittent (comes and goes Context: The wound appeared gradually over time Modifying Factors: Other treatment(s) tried include:local care and oral antibiotic Associated Signs and Symptoms: Patient reports having increase  swelling. 41 year old gentleman who has noticed a blister on his left lower extremity which came up 5 days ago and it has drained fluid. He was seen by hispractitioner who reviewed him and put him on doxycycline now asked him to come to the wound center. His past medical history is significant for diabetes mellitus type 2, congestive heart failure, renal failure, hypertension, and he is receiving hemodialysis 3 times a week. He has never been a smoker.Marland Kitchen 06/12/2016 -- the patient has in continue with hemodialysis and no changes have occurred in the last week Objective Constitutional Pulse regular. Respirations normal and unlabored. Afebrile. Vitals Time Taken: 11:17 AM, Height: 73 in, Weight: 285 lbs, BMI: 37.6, Temperature: 98.7 F, Pulse: 79 bpm, Respiratory Rate: 16 breaths/min, Blood Pressure: 134/80 mmHg. Eyes SERVANDO, KYLLONEN ([id number]) Nonicteric. Reactive to light. Ears, Nose, Mouth, and Throat Lips, teeth, and gums WNL.Marland Kitchen Moist mucosa without lesions. Neck supple and nontender. No palpable supraclavicular or cervical adenopathy. Normal sized without goiter. Respiratory WNL. No retractions.. Cardiovascular Pedal Pulses WNL. No clubbing, cyanosis or edema. Gastrointestinal (GI) Abdomen without masses or tenderness.. No liver or spleen enlargement or tenderness.. Lymphatic No adneopathy. No adenopathy. No adenopathy. Musculoskeletal Adexa without tenderness or enlargement.. Digits and nails w/o clubbing, cyanosis, infection, petechiae, ischemia, or inflammatory conditions.Marland Kitchen Psychiatric Judgement and insight Intact.. No evidence of depression, anxiety, or agitation.. General Notes: partially the skin over the blister has remained intact but there is a lot of loose tissue and subcutaneous tissue which need sharp debridement with the forceps and scissors and I have done this. No bleeding. Integumentary (Hair, Skin) No suspicious lesions. No crepitus or fluctuance. No  peri-wound warmth or erythema. No masses.. Wound #1 status is Open. Original cause of wound was Blister. The wound is located on the Left,Medial Lower Leg. The wound measures 7.6cm length x 8cm width x 0.1cm depth; 47.752cm^2 area and 4.775cm^3 volume. The wound is limited to skin breakdown. There is no tunneling or undermining noted. There is a large amount of serous drainage noted. The wound margin is flat and intact. There is large (67- 100%) red granulation within the wound bed. There is a small (1-33%) amount of necrotic tissue within the wound bed including Adherent Slough. The periwound skin appearance exhibited: Erythema. The periwound skin appearance did not exhibit: Callus, Crepitus, Excoriation, Induration, Rash, Scarring, Dry/Scaly, Maceration, Atrophie Blanche, Cyanosis, Ecchymosis, Hemosiderin Staining, Mottled, Pallor, Rubor. The surrounding wound skin color is noted with erythema which is circumferential. Periwound temperature was noted as No Abnormality. The periwound has tenderness on palpation. 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 There was a Skin/Subcutaneous Tissue Debridement (51102-11173) debridement with total area of 6 sq cm performed by Christin Fudge,  MD. with the following instrument(s): Forceps and Scissors to remove Viable and Non-Viable tissue/material including Fibrin/Slough, Skin, and Subcutaneous after achieving pain control using Lidocaine 4% Topical Solution. A time out was conducted at 11:38, prior to the start of the procedure. A Minimum amount of bleeding was controlled with Pressure. The procedure was tolerated well with a pain level of 0  throughout and a pain level of 0 following the procedure. Post Debridement Measurements: 7.6cm length x 8cm width x 0.1cm depth; 4.775cm^3 volume. Character of Wound/Ulcer Post Debridement is improved. Severity of Tissue Post Debridement is: Fat layer exposed. Post procedure Diagnosis Wound #1: Same as Pre-Procedure Plan Wound Cleansing: Wound #1 Left,Medial Lower Leg: Clean wound with Normal Saline. Anesthetic: Wound #1 Left,Medial Lower Leg: Topical Lidocaine 4% cream applied to wound bed prior to debridement Primary Wound Dressing: Wound #1 Left,Medial Lower Leg: Mepitel One Contact layer Exufiber Ag+ Secondary Dressing: Wound #1 Left,Medial Lower Leg: ABD pad KEYANTE, DURIO ([id number]) Dressing Change Frequency: Wound #1 Left,Medial Lower Leg: Change dressing every week Follow-up Appointments: Wound #1 Left,Medial Lower Leg: Return Appointment in 1 week. Nurse Visit as needed Edema Control: Wound #1 Left,Medial Lower Leg: Kerlix and Coban - Left Lower Extremity After review I have recommended: 1. Local care with a nonadherent Mepitel followed by silver alginate and a light Kerlix and Coban wrap. 2. Elevation and exercise 3. Good control of his diabetes mellitus 4. Review neck streak in the wound center Electronic Signature(s) Signed: 06/12/2016 12:07:29 PM By: Christin Fudge MD, FACS Entered By: Christin Fudge on 06/12/2016 12:07:29 [name] [name] ([id number]) -------------------------------------------------------------------------------- [place] Details Patient Name: [name] [name]. Date of Service: 06/12/2016 Medical Record Number: [id number] Patient Account Number: [id number] Date of Birth/Sex: [birth year] (41 y.o. 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 Facility Procedures CPT4 Code Description: [id number] - DEB SUBQ TISSUE 20 SQ CM/< ICD-10 Description Diagnosis E11.622 Type 2 diabetes mellitus with other skin ulcer Z99.2 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

## 2016-06-19 ENCOUNTER — Encounter: Payer: Medicare Other | Admitting: Surgery

## 2016-06-19 DIAGNOSIS — E11622 Type 2 diabetes mellitus with other skin ulcer: Secondary | ICD-10-CM | POA: Diagnosis not present

## 2016-06-21 NOTE — Progress Notes (Signed)
CLAVIN, RUHLMAN ([id number]) Visit Report for 06/19/2016 Chief Complaint Document Details Patient Name: [name] [name], [name] [name]. Date of Service: 06/19/2016 11:00 AM Medical Record Number: [id number] Patient Account Number: [id number] Date of Birth/Sex: [birth year] (41 y.o. Male) Treating RN: Ahmed Prima Primary Care Provider: Kasandra Knudsen Other Clinician: Referring Provider: Kasandra Knudsen Treating Provider/Extender: Frann Rider in Treatment: 2 Information Obtained from: Patient Chief Complaint Patients presents for treatment of an open diabetic ulcer the left lower extremity for 5 days Electronic Signature(s) Signed: 06/19/2016 11:38:26 AM By: Christin Fudge MD, FACS Entered By: Christin Fudge on 06/19/2016 11:38:26 [name] [name] ([id number]) -------------------------------------------------------------------------------- HPI Details Patient Name: [name] [name]. Date of Service: 06/19/2016 11:00 AM Medical Record Number: [id number] Patient Account Number: [id number] Date of Birth/Sex: Aug 20, 1975 (41 y.o. Male) Treating RN: Ahmed Prima Primary Care Provider: Kasandra Knudsen Other Clinician: Referring Provider: Kasandra Knudsen Treating Provider/Extender: Frann Rider in Treatment: 2 History of Present Illness Location: left lower extremity Quality: Patient reports experiencing a dull pain to affected area(s). Severity: Patient states wound (s) are getting better. Duration: Patient has had the wound for < 2 weeks prior to presenting for treatment Timing: Pain in wound is Intermittent (comes and goes Context: The wound appeared gradually over time Modifying Factors: Other treatment(s) tried include:local care and oral antibiotic Associated Signs and Symptoms: Patient reports having increase swelling. HPI Description: 41 year old gentleman who has noticed a blister on his left lower extremity which came up 5 days ago and it has drained fluid. He was seen by  hispractitioner who reviewed him and put him on doxycycline now asked him to come to the wound center. His past medical history is significant for diabetes mellitus type 2, congestive heart failure, renal failure, hypertension, and he is receiving hemodialysis 3 times a week. He has never been a smoker.Marland Kitchen 06/12/2016 -- the patient has in continue with hemodialysis and no changes have occurred in the last week Electronic Signature(s) Signed: 06/19/2016 11:38:34 AM By: Christin Fudge MD, FACS Entered By: Christin Fudge on 06/19/2016 11:38:33 [name] [name] ([id number]) -------------------------------------------------------------------------------- Physical Exam Details Patient Name: [name] [name]. Date of Service: 06/19/2016 11:00 AM Medical Record Number: [id number] Patient Account Number: [id number] Date of Birth/Sex: 01-06-1976 (41 y.o. Male) Treating RN: Ahmed Prima Primary Care Provider: Kasandra Knudsen Other Clinician: Referring Provider: Kasandra Knudsen Treating Provider/Extender: Frann Rider in Treatment: 2 Constitutional . Pulse regular. Respirations normal and unlabored. Afebrile. . Eyes Nonicteric. Reactive to light. Ears, Nose, Mouth, and Throat Lips, teeth, and gums WNL.Marland Kitchen Moist mucosa without lesions. Neck supple and nontender. No palpable supraclavicular or cervical adenopathy. Normal sized without goiter. Respiratory WNL. No retractions.. Cardiovascular Pedal Pulses WNL. No clubbing, cyanosis or edema. Lymphatic No adneopathy. No adenopathy. No adenopathy. Musculoskeletal Adexa without tenderness or enlargement.. Digits and nails w/o clubbing, cyanosis, infection, petechiae, ischemia, or inflammatory conditions.. Integumentary (Hair, Skin) No suspicious lesions. No crepitus or fluctuance. No peri-wound warmth or erythema. No masses.Marland Kitchen Psychiatric Judgement and insight Intact.. No evidence of depression, anxiety, or agitation.. Notes moist saline  gauze was used to remove all the necrotic loose epidermis and under this there is healthy granulation tissue and no sharp debridement was required. Electronic Signature(s) Signed: 06/19/2016 11:39:09 AM By: Christin Fudge MD, FACS Entered By: Christin Fudge on 06/19/2016 11:39:09 [name] [name] ([id number]) -------------------------------------------------------------------------------- Physician Orders Details Patient Name: [name] [name]. Date of Service: 06/19/2016 11:00 AM Medical Record Number: [id number] Patient Account Number: [id number] Date of Birth/Sex: January 21, 1976 (41 y.o. Male) Treating RN: Carolyne Fiscal, [place]  Primary Care Provider: Kasandra Knudsen Other Clinician: Referring Provider: Kasandra Knudsen Treating Provider/Extender: Frann Rider in Treatment: 2 Verbal / Phone Orders: Yes Clinician: Pinkerton, Debi Read Back and Verified: Yes Diagnosis Coding Wound Cleansing Wound #1 Left,Medial Lower Leg o Clean wound with Normal Saline. o Cleanse wound with mild soap and water o May Shower, gently pat wound dry prior to applying new dressing. Anesthetic Wound #1 Left,Medial Lower Leg o Topical Lidocaine 4% cream applied to wound bed prior to debridement Primary Wound Dressing Wound #1 Left,Medial Lower Leg o Aquacel Ag Secondary Dressing Wound #1 Left,Medial Lower Leg o ABD pad o Other - tape Dressing Change Frequency Wound #1 Left,Medial Lower Leg o Change dressing every week Follow-up Appointments Wound #1 Left,Medial Lower Leg o Return Appointment in 1 week. o Nurse Visit as needed Edema Control Wound #1 Left,Medial Lower Leg o Elevate legs to the level of the heart and pump ankles as often as possible Electronic Signature(s) Signed: 06/19/2016 4:29:50 PM By: Christin Fudge MD, FACS [name] [name] ([id number]) Signed: 06/20/2016 4:53:33 PM By: Alric Quan Entered By: Alric Quan on 06/19/2016 11:37:36 [name] [name]  ([id number]) -------------------------------------------------------------------------------- Problem List Details Patient Name: [name] [name]. Date of Service: 06/19/2016 11:00 AM Medical Record Number: [id number] Patient Account Number: [id number] Date of Birth/Sex: 14-Dec-1975 (41 y.o. Male) Treating RN: Ahmed Prima Primary Care Provider: Kasandra Knudsen Other Clinician: Referring Provider: Kasandra Knudsen Treating Provider/Extender: Frann Rider in Treatment: 2 Active Problems ICD-10 Encounter Code Description Active Date Diagnosis E11.622 Type 2 diabetes mellitus with other skin ulcer 06/05/2016 Yes Z99.2 Dependence on renal dialysis 06/05/2016 Yes L97.221 Non-pressure chronic ulcer of left calf limited to 06/05/2016 Yes breakdown of skin Inactive Problems Resolved Problems Electronic Signature(s) Signed: 06/19/2016 11:38:14 AM By: Christin Fudge MD, FACS Entered By: Christin Fudge on 06/19/2016 11:38:14 [name] [name] ([id number]) -------------------------------------------------------------------------------- Progress Note Details Patient Name: [name] [name]. Date of Service: 06/19/2016 11:00 AM Medical Record Number: [id number] Patient Account Number: [id number] Date of Birth/Sex: 26-Apr-1976 (41 y.o. Male) Treating RN: Ahmed Prima Primary Care Provider: Kasandra Knudsen Other Clinician: Referring Provider: Kasandra Knudsen Treating Provider/Extender: Frann Rider in Treatment: 2 Subjective Chief Complaint Information obtained from Patient Patients presents for treatment of an open diabetic ulcer the left lower extremity for 5 days History of Present Illness (HPI) The following HPI elements were documented for the patient's wound: Location: left lower extremity Quality: Patient reports experiencing a dull pain to affected area(s). Severity: Patient states wound (s) are getting better. Duration: Patient has had the wound for < 2 weeks prior to  presenting for treatment Timing: Pain in wound is Intermittent (comes and goes Context: The wound appeared gradually over time Modifying Factors: Other treatment(s) tried include:local care and oral antibiotic Associated Signs and Symptoms: Patient reports having increase swelling. 41 year old gentleman who has noticed a blister on his left lower extremity which came up 5 days ago and it has drained fluid. He was seen by hispractitioner who reviewed him and put him on doxycycline now asked him to come to the wound center. His past medical history is significant for diabetes mellitus type 2, congestive heart failure, renal failure, hypertension, and he is receiving hemodialysis 3 times a week. He has never been a smoker.Marland Kitchen 06/12/2016 -- the patient has in continue with hemodialysis and no changes have occurred in the last week Objective Constitutional Pulse regular. Respirations normal and unlabored. Afebrile. Vitals Time Taken: 11:12 AM, Height: 73 in, Weight: 285 lbs, BMI: 37.6, Temperature:  98.2 F, Pulse: 80 bpm, Respiratory Rate: 18 breaths/min, Blood Pressure: 137/84 mmHg. Eyes [name] [name], [name] [name] ([id number]) Nonicteric. Reactive to light. Ears, Nose, Mouth, and Throat Lips, teeth, and gums WNL.Marland Kitchen Moist mucosa without lesions. Neck supple and nontender. No palpable supraclavicular or cervical adenopathy. Normal sized without goiter. Respiratory WNL. No retractions.. Cardiovascular Pedal Pulses WNL. No clubbing, cyanosis or edema. Lymphatic No adneopathy. No adenopathy. No adenopathy. Musculoskeletal Adexa without tenderness or enlargement.. Digits and nails w/o clubbing, cyanosis, infection, petechiae, ischemia, or inflammatory conditions.Marland Kitchen Psychiatric Judgement and insight Intact.. No evidence of depression, anxiety, or agitation.. General Notes: moist saline gauze was used to remove all the necrotic loose epidermis and under this there is healthy granulation tissue and no  sharp debridement was required. Integumentary (Hair, Skin) No suspicious lesions. No crepitus or fluctuance. No peri-wound warmth or erythema. No masses.. Wound #1 status is Open. Original cause of wound was Blister. The wound is located on the Left,Medial Lower Leg. The wound measures 7.6cm length x 8cm width x 0.1cm depth; 47.752cm^2 area and 4.775cm^3 volume. The wound is limited to skin breakdown. There is no tunneling or undermining noted. There is a large amount of serous drainage noted. The wound margin is flat and intact. There is medium (34-66%) red granulation within the wound bed. There is a medium (34-66%) amount of necrotic tissue within the wound bed including Eschar and Adherent Slough. The periwound skin appearance exhibited: Erythema. The periwound skin appearance did not exhibit: Callus, Crepitus, Excoriation, Induration, Rash, Scarring, Dry/Scaly, Maceration, Atrophie Blanche, Cyanosis, Ecchymosis, Hemosiderin Staining, Mottled, Pallor, Rubor. The surrounding wound skin color is noted with erythema which is circumferential. Periwound temperature was noted as No Abnormality. The periwound has tenderness on palpation. 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 Cleanse wound with mild soap and water May Shower, gently pat wound dry prior to applying new dressing. Anesthetic: Wound #1 Left,Medial Lower Leg: Topical Lidocaine 4% cream applied to wound bed prior to debridement Primary Wound Dressing: Wound #1 Left,Medial Lower Leg: Aquacel Ag Secondary Dressing: Wound #1 Left,Medial Lower Leg: ABD pad Other - tape Dressing Change Frequency: Wound #1 Left,Medial Lower Leg: Change dressing  every week Follow-up Appointments: Wound #1 Left,Medial Lower Leg: Return Appointment in 1 week. Nurse Visit as needed Edema Control: Wound #1 Left,Medial Lower Leg: Elevate legs to the level of the heart and pump ankles as often as possible After review I have recommended: 1. silver alginate and a light Kerlix and Coban wrap, to be changes on alternate days. 2. Elevation and exercise 3. Good control of his diabetes mellitus 4. Review neck streak in the wound center [name] [name], [name] [name] ([id number]) Electronic Signature(s) Signed: 06/19/2016 11:40:01 AM By: Christin Fudge MD, FACS Entered By: Christin Fudge on 06/19/2016 11:40:01 [name] [name] ([id number]) -------------------------------------------------------------------------------- SuperBill Details Patient Name: [name] [name]. Date of Service: 06/19/2016 Medical Record Number: [id number] Patient Account Number: [id number] Date of Birth/Sex: 1975-07-20 (41 y.o. 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  to breakdown of skin Facility Procedures CPT4 Code: [id number] Description: 43606 - WOUND CARE VISIT-LEV 3 EST PT Modifier: Quantity: 1 Physician Procedures CPT4 Code Description: [id number] - WC PHYS LEVEL 3 - EST PT ICD-10 Description Diagnosis E11.622 Type 2 diabetes mellitus with other skin ulcer Z99.2 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

## 2016-06-21 NOTE — Progress Notes (Signed)
[name], [name] ([id number]) Visit Report for 06/19/2016 Arrival Information Details Patient Name: [name] [name], [name] [name]. Date of Service: 06/19/2016 11:00 AM Medical Record Number: [id number] Patient Account Number: [id number] Date of Birth/Sex: [birth year] (41 y.o. Male) Treating RN: Ahmed Prima Primary Care Mackayla Mullins: Kasandra Knudsen Other Clinician: Referring Jaleigh Mccroskey: Kasandra Knudsen Treating Trevontae Lindahl/Extender: Frann Rider in Treatment: 2 Visit Information History Since Last Visit All ordered tests and consults were completed: No Patient Arrived: Ambulatory Added or deleted any medications: No Arrival Time: 11:10 Any new allergies or adverse reactions: No Accompanied By: self Had a fall or experienced change in No Transfer Assistance: None activities of daily living that may affect Patient Identification Verified: Yes risk of falls: Secondary Verification Process Yes Signs or symptoms of abuse/neglect since last No Completed: visito Patient Has Alerts: Yes Hospitalized since last visit: No Patient Alerts: Patient on Blood Has Dressing in Place as Prescribed: Yes Thinner Has Compression in Place as Prescribed: Yes DMII Pain Present Now: No Electronic Signature(s) Signed: 06/20/2016 4:53:33 PM By: Alric Quan Entered By: Alric Quan on 06/19/2016 11:12:08 [name] [name] ([id number]) -------------------------------------------------------------------------------- Clinic Level of Care Assessment Details Patient Name: [name] [name]. Date of Service: 06/19/2016 11:00 AM Medical Record Number: [id number] Patient Account Number: [id number] Date of Birth/Sex: 14-Apr-1976 (41 y.o. Male) Treating RN: Carolyne Fiscal, Debi Primary Care Timi Reeser: Kasandra Knudsen Other Clinician: Referring Webb Weed: Kasandra Knudsen Treating Rai Severns/Extender: Frann Rider in Treatment: 2 Clinic Level of Care Assessment Items TOOL 4 Quantity Score X - Use when only an EandM  is performed on FOLLOW-UP visit 1 0 ASSESSMENTS - Nursing Assessment / Reassessment X - Reassessment of Co-morbidities (includes updates in patient status) 1 10 X - Reassessment of Adherence to Treatment Plan 1 5 ASSESSMENTS - Wound and Skin Assessment / Reassessment X - Simple Wound Assessment / Reassessment - one wound 1 5 []  - Complex Wound Assessment / Reassessment - multiple wounds 0 []  - Dermatologic / Skin Assessment (not related to wound area) 0 ASSESSMENTS - Focused Assessment X - Circumferential Edema Measurements - multi extremities 1 5 []  - Nutritional Assessment / Counseling / Intervention 0 []  - Lower Extremity Assessment (monofilament, tuning fork, pulses) 0 []  - Peripheral Arterial Disease Assessment (using hand held doppler) 0 ASSESSMENTS - Ostomy and/or Continence Assessment and Care []  - Incontinence Assessment and Management 0 []  - Ostomy Care Assessment and Management (repouching, etc.) 0 PROCESS - Coordination of Care X - Simple Patient / Family Education for ongoing care 1 15 []  - Complex (extensive) Patient / Family Education for ongoing care 0 []  - Staff obtains Programmer, systems, Records, Test Results / Process Orders 0 []  - Staff telephones HHA, Nursing Homes / Clarify orders / etc 0 []  - Routine Transfer to another Facility (non-emergent condition) 0 [name] [name], [name] [name] ([id number]) []  - Routine Hospital Admission (non-emergent condition) 0 []  - New Admissions / Biomedical engineer / Ordering NPWT, Apligraf, etc. 0 []  - Emergency Hospital Admission (emergent condition) 0 X - Simple Discharge Coordination 1 10 []  - Complex (extensive) Discharge Coordination 0 PROCESS - Special Needs []  - Pediatric / Minor Patient Management 0 []  - Isolation Patient Management 0 []  - Hearing / Language / Visual special needs 0 []  - Assessment of Community assistance (transportation, D/C planning, etc.) 0 []  - Additional assistance / Altered mentation 0 []  - Support Surface(s)  Assessment (bed, cushion, seat, etc.) 0 INTERVENTIONS - Wound Cleansing / Measurement []  - Simple Wound Cleansing - one wound 0 X - Complex Wound Cleansing -  multiple wounds 1 5 X - Wound Imaging (photographs - any number of wounds) 1 5 []  - Wound Tracing (instead of photographs) 0 X - Simple Wound Measurement - one wound 1 5 []  - Complex Wound Measurement - multiple wounds 0 INTERVENTIONS - Wound Dressings []  - Small Wound Dressing one or multiple wounds 0 X - Medium Wound Dressing one or multiple wounds 1 15 []  - Large Wound Dressing one or multiple wounds 0 X - Application of Medications - topical 1 5 []  - Application of Medications - injection 0 INTERVENTIONS - Miscellaneous []  - External ear exam 0 [name] [name], [name] [name] ([id number]) []  - Specimen Collection (cultures, biopsies, blood, body fluids, etc.) 0 []  - Specimen(s) / Culture(s) sent or taken to Lab for analysis 0 []  - Patient Transfer (multiple staff / Harrel Lemon Lift / Similar devices) 0 []  - Simple Staple / Suture removal (25 or less) 0 []  - Complex Staple / Suture removal (26 or more) 0 []  - Hypo / Hyperglycemic Management (close monitor of Blood Glucose) 0 []  - Ankle / Brachial Index (ABI) - do not check if billed separately 0 X - Vital Signs 1 5 Has the patient been seen at the hospital within the last three years: Yes Total Score: 90 Level Of Care: New/Established - Level 3 Electronic Signature(s) Signed: 06/20/2016 4:53:33 PM By: Alric Quan Entered By: Alric Quan on 06/19/2016 13:07:41 [name] [name] ([id number]) -------------------------------------------------------------------------------- Encounter Discharge Information Details Patient Name: [name] [name]. Date of Service: 06/19/2016 11:00 AM Medical Record Number: [id number] Patient Account Number: [id number] Date of Birth/Sex: 1975-10-17 (41 y.o. Male) Treating RN: Ahmed Prima Primary Care Kieanna Rollo: Kasandra Knudsen Other Clinician: Referring  Panda Crossin: Kasandra Knudsen Treating Maxwel Meadowcroft/Extender: Frann Rider in Treatment: 2 Encounter Discharge Information Items Discharge Pain Level: 0 Discharge Condition: Stable Ambulatory Status: Ambulatory Discharge Destination: Home Transportation: Private Auto Accompanied By: self Schedule Follow-up Appointment: Yes Medication Reconciliation completed and provided to Patient/Care No Kayleanna Lorman: Provided on Clinical Summary of Care: 06/19/2016 Form Type Recipient Paper Patient LT Electronic Signature(s) Signed: 06/19/2016 11:44:40 AM By: Ruthine Dose Entered By: Ruthine Dose on 06/19/2016 11:44:40 [name] [name] ([id number]) -------------------------------------------------------------------------------- Lower Extremity Assessment Details Patient Name: [name] [name]. Date of Service: 06/19/2016 11:00 AM Medical Record Number: [id number] Patient Account Number: [id number] Date of Birth/Sex: 04-25-1976 (41 y.o. Male) Treating RN: Ahmed Prima Primary Care Eleri Ruben: Kasandra Knudsen Other Clinician: Referring Daizee Firmin: Kasandra Knudsen Treating Annaliesa Blann/Extender: Frann Rider in Treatment: 2 Edema Assessment Assessed: [Left: No] [Right: No] E[Left: dema] [Right: :] Calf Left: Right: Point of Measurement: 36 cm From Medial Instep 36.8 cm cm Ankle Left: Right: Point of Measurement: 12 cm From Medial Instep 22.9 cm cm Vascular Assessment Pulses: Dorsalis Pedis Palpable: [Left:Yes] Posterior Tibial Extremity colors, hair growth, and conditions: Extremity Color: [Left:Normal] Temperature of Extremity: [Left:Warm] Capillary Refill: [Left:< 3 seconds] Toe Nail Assessment Left: Right: Thick: No Discolored: Yes Deformed: No Improper Length and Hygiene: No Electronic Signature(s) Signed: 06/20/2016 4:53:33 PM By: Alric Quan Entered By: Alric Quan on 06/19/2016 11:23:21 [name] [name]  ([id number]) -------------------------------------------------------------------------------- Multi Wound Chart Details Patient Name: [name] [name]. Date of Service: 06/19/2016 11:00 AM Medical Record Number: [id number] Patient Account Number: [id number] Date of Birth/Sex: [birth year] (41 y.o. Male) Treating RN: Ahmed Prima Primary Care Saraih Lorton: Kasandra Knudsen Other Clinician: Referring Perris Tripathi: Kasandra Knudsen Treating Joda Braatz/Extender: Frann Rider in Treatment: 2 Vital Signs Height(in): 73 Pulse(bpm): 80 Weight(lbs): 285 Blood Pressure 137/84 (mmHg): Body Mass Index(BMI): 38 Temperature(F): 98.2 Respiratory Rate 18 (  breaths/min): Photos: [1:No Photos] [N/A:N/A] Wound Location: [1:Left Lower Leg - Medial] [N/A:N/A] Wounding Event: [1:Blister] [N/A:N/A] Primary Etiology: [1:Diabetic Wound/Ulcer of the Lower Extremity] [N/A:N/A] Secondary Etiology: [1:Venous Leg Ulcer] [N/A:N/A] Comorbid History: [1:Hypertension, Type II Diabetes] [N/A:N/A] Date Acquired: [1:06/03/2016] [N/A:N/A] Weeks of Treatment: [1:2] [N/A:N/A] Wound Status: [1:Open] [N/A:N/A] Measurements L x W x D 7.6x8x0.1 [N/A:N/A] (cm) Area (cm) : [1:47.752] [N/A:N/A] Volume (cm) : [1:4.775] [N/A:N/A] % Reduction in Area: [1:33.90%] [N/A:N/A] % Reduction in Volume: 33.90% [N/A:N/A] Classification: [1:Grade 1] [N/A:N/A] Exudate Amount: [1:Large] [N/A:N/A] Exudate Type: [1:Serous] [N/A:N/A] Exudate Color: [1:amber] [N/A:N/A] Wound Margin: [1:Flat and Intact] [N/A:N/A] Granulation Amount: [1:Medium (34-66%)] [N/A:N/A] Granulation Quality: [1:Red] [N/A:N/A] Necrotic Amount: [1:Medium (34-66%)] [N/A:N/A] Necrotic Tissue: [1:Eschar, Adherent Slough] [N/A:N/A] Exposed Structures: [1:Fascia: No Fat Layer (Subcutaneous Tissue) Exposed: No Tendon: No] [N/A:N/A] Muscle: No Joint: No Bone: No Limited to Skin Breakdown Epithelialization: None N/A N/A Periwound Skin Texture: Excoriation: No N/A  N/A Induration: No Callus: No Crepitus: No Rash: No Scarring: No Periwound Skin Maceration: No N/A N/A Moisture: Dry/Scaly: No Periwound Skin Color: Erythema: Yes N/A N/A Atrophie Blanche: No Cyanosis: No Ecchymosis: No Hemosiderin Staining: No Mottled: No Pallor: No Rubor: No Erythema Location: Circumferential N/A N/A Temperature: No Abnormality N/A N/A Tenderness on Yes N/A N/A Palpation: Wound Preparation: Ulcer Cleansing: N/A N/A Rinsed/Irrigated with Saline Topical Anesthetic Applied: Other: lidocaine 4% Treatment Notes Wound #1 (Left, Medial Lower Leg) 1. Cleansed with: Clean wound with Normal Saline Cleanse wound with antibacterial soap and water 2. Anesthetic Topical Lidocaine 4% cream to wound bed prior to debridement 4. Dressing Applied: Aquacel Ag 5. Secondary Dressing Applied ABD Pad 7. Secured with Tape [name] [name], [name] [name] ([id number]) Electronic Signature(s) Signed: 06/19/2016 11:38:19 AM By: Christin Fudge MD, FACS Entered By: Christin Fudge on 06/19/2016 11:38:19 [name] [name] ([id number]) -------------------------------------------------------------------------------- [place] Details Patient Name: [name] [name]. Date of Service: 06/19/2016 11:00 AM Medical Record Number: [id number] Patient Account Number: [id number] Date of Birth/Sex: August 06, 1975 (41 y.o. Male) Treating RN: Ahmed Prima Primary Care Raizy Auzenne: Kasandra Knudsen Other Clinician: Referring Kollins Fenter: Kasandra Knudsen Treating Sonnie Pawloski/Extender: Frann Rider in Treatment: 2 Active Inactive ` Orientation to the Wound Care Program Nursing Diagnoses: Knowledge deficit related to the wound healing center program Goals: Patient/caregiver will verbalize understanding of the [facility] Date Initiated: 06/05/2016 Target Resolution Date: 08/29/2016 Goal Status: Active Interventions: Provide education on orientation to the wound  center Notes: ` Wound/Skin Impairment Nursing Diagnoses: Knowledge deficit related to ulceration/compromised skin integrity Goals: Patient/caregiver will verbalize understanding of skin care regimen Date Initiated: 06/05/2016 Target Resolution Date: 08/29/2016 Goal Status: Active Ulcer/skin breakdown will have a volume reduction of 30% by week 4 Date Initiated: 06/05/2016 Target Resolution Date: 08/29/2016 Goal Status: Active Ulcer/skin breakdown will have a volume reduction of 50% by week 8 Date Initiated: 06/05/2016 Target Resolution Date: 08/29/2016 Goal Status: Active Ulcer/skin breakdown will have a volume reduction of 80% by week 12 Date Initiated: 06/05/2016 Target Resolution Date: 08/29/2016 Goal Status: Active Ulcer/skin breakdown will heal within 14 weeks Date Initiated: 06/05/2016 Target Resolution Date: 08/29/2016 [name] [name], [name] [name] ([id number]) Goal Status: Active Interventions: Assess patient/caregiver ability to obtain necessary supplies Assess patient/caregiver ability to perform ulcer/skin care regimen upon admission and as needed Assess ulceration(s) every visit Notes: Electronic Signature(s) Signed: 06/20/2016 4:53:33 PM By: Alric Quan Entered By: Alric Quan on 06/19/2016 11:35:50 [name] [name] ([id number]) -------------------------------------------------------------------------------- Pain Assessment Details Patient Name: [name] [name]. Date of Service: 06/19/2016 11:00 AM Medical Record Number: [id number] Patient Account  Number: [id number] Date of Birth/Sex: October 09, 1975 (41 y.o. Male) Treating RN: Carolyne Fiscal, Debi Primary Care Rimsha Trembley: Kasandra Knudsen Other Clinician: Referring Paelyn Smick: Kasandra Knudsen Treating Tye Vigo/Extender: Frann Rider in Treatment: 2 Active Problems Location of Pain Severity and Description of Pain Patient Has Paino No Site Locations With Dressing Change: No Pain Management and Medication Current Pain  Management: Electronic Signature(s) Signed: 06/20/2016 4:53:33 PM By: Alric Quan Entered By: Alric Quan on 06/19/2016 11:12:13 [name] [name] ([id number]) -------------------------------------------------------------------------------- Patient/Caregiver Education Details Patient Name: [name] [name]. Date of Service: 06/19/2016 11:00 AM Medical Record Number: [id number] Patient Account Number: [id number] Date of Birth/Gender: Mar 17, 1976 (41 y.o. Male) Treating RN: Ahmed Prima Primary Care Physician: Kasandra Knudsen Other Clinician: Referring Physician: Kasandra Knudsen Treating Physician/Extender: Frann Rider in Treatment: 2 Education Assessment Education Provided To: Patient Education Topics Provided Wound/Skin Impairment: Handouts: Other: change dressing as ordered Methods: Demonstration, Explain/Verbal Responses: State content correctly Electronic Signature(s) Signed: 06/20/2016 4:53:33 PM By: Alric Quan Entered By: Alric Quan on 06/19/2016 11:31:50 [name] [name] ([id number]) -------------------------------------------------------------------------------- Wound Assessment Details Patient Name: [name] [name]. Date of Service: 06/19/2016 11:00 AM Medical Record Number: [id number] Patient Account Number: [id number] Date of Birth/Sex: 12/16/1975 (41 y.o. Male) Treating RN: Carolyne Fiscal, Debi Primary Care Toi Stelly: Kasandra Knudsen Other Clinician: Referring Celinda Dethlefs: Kasandra Knudsen Treating Sacha Radloff/Extender: Frann Rider in Treatment: 2 Wound Status Wound Number: 1 Primary Etiology: Diabetic Wound/Ulcer of the Lower Extremity Wound Location: Left Lower Leg - Medial Secondary Venous Leg Ulcer Wounding Event: Blister Etiology: Date Acquired: 06/03/2016 Wound Status: Open Weeks Of Treatment: 2 Comorbid Hypertension, Type II Diabetes Clustered Wound: No History: Photos Photo Uploaded By: Alric Quan on 06/19/2016  13:11:20 Wound Measurements Length: (cm) 7.6 Width: (cm) 8 Depth: (cm) 0.1 Area: (cm) 47.752 Volume: (cm) 4.775 % Reduction in Area: 33.9% % Reduction in Volume: 33.9% Epithelialization: None Tunneling: No Undermining: No Wound Description Classification: Grade 1 Wound Margin: Flat and Intact Exudate Amount: Large Exudate Type: Serous Exudate Color: amber Foul Odor After Cleansing: No Slough/Fibrino No Wound Bed Granulation Amount: Medium (34-66%) Exposed Structure Granulation Quality: Red Fascia Exposed: No Necrotic Amount: Medium (34-66%) Fat Layer (Subcutaneous Tissue) Exposed: No Necrotic Quality: Eschar, Adherent Slough Tendon Exposed: No [name] [name], [name] [name] ([id number]) Muscle Exposed: No Joint Exposed: No Bone Exposed: No Limited to Skin Breakdown Periwound Skin Texture Texture Color No Abnormalities Noted: No No Abnormalities Noted: No Callus: No Atrophie Blanche: No Crepitus: No Cyanosis: No Excoriation: No Ecchymosis: No Induration: No Erythema: Yes Rash: No Erythema Location: Circumferential Scarring: No Hemosiderin Staining: No Mottled: No Moisture Pallor: No No Abnormalities Noted: No Rubor: No Dry / Scaly: No Maceration: No Temperature / Pain Temperature: No Abnormality Tenderness on Palpation: Yes Wound Preparation Ulcer Cleansing: Rinsed/Irrigated with Saline Topical Anesthetic Applied: Other: lidocaine 4%, Treatment Notes Wound #1 (Left, Medial Lower Leg) 1. Cleansed with: Clean wound with Normal Saline Cleanse wound with antibacterial soap and water 2. Anesthetic Topical Lidocaine 4% cream to wound bed prior to debridement 4. Dressing Applied: Aquacel Ag 5. Secondary Dressing Applied ABD Pad 7. Secured with Recruitment consultant) Signed: 06/20/2016 4:53:33 PM By: Alric Quan Entered By: Alric Quan on 06/19/2016 11:21:55 [name] [name]  ([id number]) -------------------------------------------------------------------------------- [place] Details Patient Name: [name] [name]. Date of Service: 06/19/2016 11:00 AM Medical Record Number: [id number] Patient Account Number: [id number] Date of Birth/Sex: [birth year] (41 y.o. Male) Treating RN: Ahmed Prima Primary Care Latrish Mogel: Kasandra Knudsen Other Clinician: Referring Haakon Titsworth: Kasandra Knudsen Treating Gwyn Hieronymus/Extender: Frann Rider in Treatment: 2 Vital Signs  Time Taken: 11:12 Temperature (F): 98.2 Height (in): 73 Pulse (bpm): 80 Weight (lbs): 285 Respiratory Rate (breaths/min): 18 Body Mass Index (BMI): 37.6 Blood Pressure (mmHg): 137/84 Reference Range: 80 - 120 mg / dl Electronic Signature(s) Signed: 06/20/2016 4:53:33 PM By: Alric Quan Entered By: Alric Quan on 06/19/2016 11:13:49

## 2016-06-26 ENCOUNTER — Encounter: Payer: Medicare Other | Attending: Surgery | Admitting: Surgery

## 2016-06-26 DIAGNOSIS — Z992 Dependence on renal dialysis: Secondary | ICD-10-CM | POA: Diagnosis not present

## 2016-06-26 DIAGNOSIS — Z7901 Long term (current) use of anticoagulants: Secondary | ICD-10-CM | POA: Diagnosis not present

## 2016-06-26 DIAGNOSIS — L97221 Non-pressure chronic ulcer of left calf limited to breakdown of skin: Secondary | ICD-10-CM | POA: Insufficient documentation

## 2016-06-26 DIAGNOSIS — Z794 Long term (current) use of insulin: Secondary | ICD-10-CM | POA: Insufficient documentation

## 2016-06-26 DIAGNOSIS — I130 Hypertensive heart and chronic kidney disease with heart failure and stage 1 through stage 4 chronic kidney disease, or unspecified chronic kidney disease: Secondary | ICD-10-CM | POA: Insufficient documentation

## 2016-06-26 DIAGNOSIS — N189 Chronic kidney disease, unspecified: Secondary | ICD-10-CM | POA: Diagnosis not present

## 2016-06-26 DIAGNOSIS — E1122 Type 2 diabetes mellitus with diabetic chronic kidney disease: Secondary | ICD-10-CM | POA: Diagnosis not present

## 2016-06-26 DIAGNOSIS — I509 Heart failure, unspecified: Secondary | ICD-10-CM | POA: Diagnosis not present

## 2016-06-26 DIAGNOSIS — Z79899 Other long term (current) drug therapy: Secondary | ICD-10-CM | POA: Diagnosis not present

## 2016-06-26 DIAGNOSIS — E11622 Type 2 diabetes mellitus with other skin ulcer: Secondary | ICD-10-CM | POA: Diagnosis not present

## 2016-06-27 NOTE — Progress Notes (Signed)
[name], [name] ([id number]) Visit Report for 06/26/2016 Chief Complaint Document Details Patient Name: [name] [name], [name] [name]. Date of Service: 06/26/2016 11:00 AM Medical Record Number: [id number] Patient Account Number: [id number] Date of Birth/Sex: 09/08/1975 (41 y.o. Male) Treating RN: Montey Hora Primary Care Provider: Kasandra Knudsen Other Clinician: Referring Provider: Kasandra Knudsen Treating Provider/Extender: Frann Rider in Treatment: 3 Information Obtained from: Patient Chief Complaint Patients presents for treatment of an open diabetic ulcer the left lower extremity for 5 days Electronic Signature(s) Signed: 06/26/2016 12:02:00 PM By: Christin Fudge MD, FACS Entered By: Christin Fudge on 06/26/2016 12:01:59 [name] [name] ([id number]) -------------------------------------------------------------------------------- Debridement Details Patient Name: [name] [name]. Date of Service: 06/26/2016 11:00 AM Medical Record Number: [id number] Patient Account Number: [id number] Date of Birth/Sex: 08/18/1975 (41 y.o. Male) Treating RN: Montey Hora Primary Care Provider: Kasandra Knudsen Other Clinician: Referring Provider: Kasandra Knudsen Treating Provider/Extender: Frann Rider in Treatment: 3 Debridement Performed for Wound #1 Left,Medial Lower Leg Assessment: Performed By: Physician Christin Fudge, MD Debridement: Debridement Pre-procedure Yes - 11:19 Verification/Time Out Taken: Start Time: 11:19 Pain Control: Lidocaine 4% Topical Solution Level: Skin/Subcutaneous Tissue Total Area Debrided (L x 7 (cm) x 6 (cm) = 42 (cm) W): Tissue and other Viable, Non-Viable, Eschar, Fibrin/Slough, Subcutaneous material debrided: Instrument: Curette Bleeding: Minimum Hemostasis Achieved: Pressure End Time: 11:24 Procedural Pain: 0 Post Procedural Pain: 0 Response to Treatment: Procedure was tolerated well Post Debridement Measurements of Total Wound Length: (cm)  7.4 Width: (cm) 6.6 Depth: (cm) 0.1 Volume: (cm) 3.836 Character of Wound/Ulcer Post Improved Debridement: Severity of Tissue Post Debridement: Fat layer exposed Post Procedure Diagnosis Same as Pre-procedure Electronic Signature(s) Signed: 06/26/2016 12:01:43 PM By: Christin Fudge MD, FACS Signed: 06/26/2016 4:53:39 PM By: Montey Hora Entered By: Christin Fudge on 06/26/2016 12:01:43 [name] [name] ([id number]) [name] [name] ([id number]) -------------------------------------------------------------------------------- HPI Details Patient Name: [name] [name]. Date of Service: 06/26/2016 11:00 AM Medical Record Number: [id number] Patient Account Number: [id number] Date of Birth/Sex: February 09, 1976 (41 y.o. Male) Treating RN: Montey Hora Primary Care Provider: Kasandra Knudsen Other Clinician: Referring Provider: Kasandra Knudsen Treating Provider/Extender: Frann Rider in Treatment: 3 History of Present Illness Location: left lower extremity Quality: Patient reports experiencing a dull pain to affected area(s). Severity: Patient states wound (s) are getting better. Duration: Patient has had the wound for < 2 weeks prior to presenting for treatment Timing: Pain in wound is Intermittent (comes and goes Context: The wound appeared gradually over time Modifying Factors: Other treatment(s) tried include:local care and oral antibiotic Associated Signs and Symptoms: Patient reports having increase swelling. HPI Description: 41 year old gentleman who has noticed a blister on his left lower extremity which came up 5 days ago and it has drained fluid. He was seen by hispractitioner who reviewed him and put him on doxycycline now asked him to come to the wound center. His past medical history is significant for diabetes mellitus type 2, congestive heart failure, renal failure, hypertension, and he is receiving hemodialysis 3 times a week. He has never been a smoker.Marland Kitchen 06/12/2016  -- the patient has in continue with hemodialysis and no changes have occurred in the last week Electronic Signature(s) Signed: 06/26/2016 12:02:08 PM By: Christin Fudge MD, FACS Entered By: Christin Fudge on 06/26/2016 12:02:07 [name] [name] ([id number]) -------------------------------------------------------------------------------- Physical Exam Details Patient Name: [name] [name]. Date of Service: 06/26/2016 11:00 AM Medical Record Number: [id number] Patient Account Number: [id number] Date of Birth/Sex: June 03, 1975 (41 y.o. Male) Treating RN: Montey Hora Primary Care Provider: Kasandra Knudsen  Other Clinician: Referring Provider: Kasandra Knudsen Treating Provider/Extender: Frann Rider in Treatment: 3 Constitutional . Pulse regular. Respirations normal and unlabored. Afebrile. . Eyes Nonicteric. Reactive to light. Ears, Nose, Mouth, and Throat Lips, teeth, and gums WNL.Marland Kitchen Moist mucosa without lesions. Neck supple and nontender. No palpable supraclavicular or cervical adenopathy. Normal sized without goiter. Respiratory WNL. No retractions.. Breath sounds WNL, No rubs, rales, rhonchi, or wheeze.. Cardiovascular Heart rhythm and rate regular, no murmur or gallop.. Pedal Pulses WNL. No clubbing, cyanosis or edema. Chest Breasts symmetical and no nipple discharge.. Breast tissue WNL, no masses, lumps, or tenderness.. Lymphatic No adneopathy. No adenopathy. No adenopathy. Musculoskeletal Adexa without tenderness or enlargement.. Digits and nails w/o clubbing, cyanosis, infection, petechiae, ischemia, or inflammatory conditions.. Integumentary (Hair, Skin) No suspicious lesions. No crepitus or fluctuance. No peri-wound warmth or erythema. No masses.Marland Kitchen Psychiatric Judgement and insight Intact.. No evidence of depression, anxiety, or agitation.. Notes the patient's wound has a lot of eschar in several places today and part of it is because it has dried out and has adherent  subcutaneous debris. I have sharply removed as much as I could with a #3 curet and at this stage there was minimal bleeding. He will need Santyl ointment locally Electronic Signature(s) Signed: 06/26/2016 12:02:55 PM By: Christin Fudge MD, FACS Entered By: Christin Fudge on 06/26/2016 12:02:55 [name] [name] ([id number]) -------------------------------------------------------------------------------- Physician Orders Details Patient Name: [name] [name]. Date of Service: 06/26/2016 11:00 AM Medical Record Number: [id number] Patient Account Number: [id number] Date of Birth/Sex: 12/23/1975 (41 y.o. Male) Treating RN: Montey Hora Primary Care Provider: Kasandra Knudsen Other Clinician: Referring Provider: Kasandra Knudsen Treating Provider/Extender: Frann Rider in Treatment: 3 Verbal / Phone Orders: No Diagnosis Coding Wound Cleansing Wound #1 Left,Medial Lower Leg o Clean wound with Normal Saline. o Cleanse wound with mild soap and water o May Shower, gently pat wound dry prior to applying new dressing. Anesthetic Wound #1 Left,Medial Lower Leg o Topical Lidocaine 4% cream applied to wound bed prior to debridement Primary Wound Dressing Wound #1 Left,Medial Lower Leg o Santyl Ointment Secondary Dressing Wound #1 Left,Medial Lower Leg o ABD pad o Other - tape Dressing Change Frequency Wound #1 Left,Medial Lower Leg o Change dressing every day. Follow-up Appointments Wound #1 Left,Medial Lower Leg o Return Appointment in 1 week. o Nurse Visit as needed Edema Control Wound #1 Left,Medial Lower Leg o Elevate legs to the level of the heart and pump ankles as often as possible Medications-please add to medication list. Wound #1 Left,Medial Lower Leg o Santyl Enzymatic Ointment [name] [name], [name] [name] ([id number]) Patient Medications Allergies: lisinopril Notifications Medication Indication Start End Santyl 06/26/2016 DOSE topical 250 unit/gram  ointment - ointment topical as directed Electronic Signature(s) Signed: 06/26/2016 11:28:44 AM By: Christin Fudge MD, FACS Entered By: Christin Fudge on 06/26/2016 11:28:43 [name] [name] ([id number]) -------------------------------------------------------------------------------- Problem List Details Patient Name: [name] [name]. Date of Service: 06/26/2016 11:00 AM Medical Record Number: [id number] Patient Account Number: [id number] Date of Birth/Sex: 21-May-1975 (41 y.o. Male) Treating RN: Montey Hora Primary Care Provider: Kasandra Knudsen Other Clinician: Referring Provider: Kasandra Knudsen Treating Provider/Extender: Frann Rider in Treatment: 3 Active Problems ICD-10 Encounter Code Description Active Date Diagnosis E11.622 Type 2 diabetes mellitus with other skin ulcer 06/05/2016 Yes Z99.2 Dependence on renal dialysis 06/05/2016 Yes L97.221 Non-pressure chronic ulcer of left calf limited to 06/05/2016 Yes breakdown of skin Inactive Problems Resolved Problems Electronic Signature(s) Signed: 06/26/2016 12:01:16 PM By: Christin Fudge MD, FACS Entered By: Christin Fudge  on 06/26/2016 12:01:16 [name] [name], [name] [name] ([id number]) -------------------------------------------------------------------------------- Progress Note Details Patient Name: [name] [name], BAYS. Date of Service: 06/26/2016 11:00 AM Medical Record Number: [id number] Patient Account Number: [id number] Date of Birth/Sex: [birth year] (41 y.o. Male) Treating RN: Montey Hora Primary Care Provider: Kasandra Knudsen Other Clinician: Referring Provider: Kasandra Knudsen Treating Provider/Extender: Frann Rider in Treatment: 3 Subjective Chief Complaint Information obtained from Patient Patients presents for treatment of an open diabetic ulcer the left lower extremity for 5 days History of Present Illness (HPI) The following HPI elements were documented for the patient's wound: Location: left lower  extremity Quality: Patient reports experiencing a dull pain to affected area(s). Severity: Patient states wound (s) are getting better. Duration: Patient has had the wound for < 2 weeks prior to presenting for treatment Timing: Pain in wound is Intermittent (comes and goes Context: The wound appeared gradually over time Modifying Factors: Other treatment(s) tried include:local care and oral antibiotic Associated Signs and Symptoms: Patient reports having increase swelling. 41 year old gentleman who has noticed a blister on his left lower extremity which came up 5 days ago and it has drained fluid. He was seen by hispractitioner who reviewed him and put him on doxycycline now asked him to come to the wound center. His past medical history is significant for diabetes mellitus type 2, congestive heart failure, renal failure, hypertension, and he is receiving hemodialysis 3 times a week. He has never been a smoker.Marland Kitchen 06/12/2016 -- the patient has in continue with hemodialysis and no changes have occurred in the last week Objective Constitutional Pulse regular. Respirations normal and unlabored. Afebrile. Vitals Time Taken: 11:09 AM, Height: 73 in, Weight: 285 lbs, BMI: 37.6, Temperature: 98.3 F, Pulse: 84 bpm, Respiratory Rate: 18 breaths/min, Blood Pressure: 134/73 mmHg. Eyes [name] [name], [name] [name] ([id number]) Nonicteric. Reactive to light. Ears, Nose, Mouth, and Throat Lips, teeth, and gums WNL.Marland Kitchen Moist mucosa without lesions. Neck supple and nontender. No palpable supraclavicular or cervical adenopathy. Normal sized without goiter. Respiratory WNL. No retractions.. Breath sounds WNL, No rubs, rales, rhonchi, or wheeze.. Cardiovascular Heart rhythm and rate regular, no murmur or gallop.. Pedal Pulses WNL. No clubbing, cyanosis or edema. Chest Breasts symmetical and no nipple discharge.. Breast tissue WNL, no masses, lumps, or tenderness.. Lymphatic No adneopathy. No adenopathy. No  adenopathy. Musculoskeletal Adexa without tenderness or enlargement.. Digits and nails w/o clubbing, cyanosis, infection, petechiae, ischemia, or inflammatory conditions.Marland Kitchen Psychiatric Judgement and insight Intact.. No evidence of depression, anxiety, or agitation.. General Notes: the patient's wound has a lot of eschar in several places today and part of it is because it has dried out and has adherent subcutaneous debris. I have sharply removed as much as I could with a #3 curet and at this stage there was minimal bleeding. He will need Santyl ointment locally Integumentary (Hair, Skin) No suspicious lesions. No crepitus or fluctuance. No peri-wound warmth or erythema. No masses.. Wound #1 status is Open. Original cause of wound was Blister. The wound is located on the Left,Medial Lower Leg. The wound measures 7.4cm length x 6.6cm width x 0.1cm depth; 38.359cm^2 area and 3.836cm^3 volume. The wound is limited to skin breakdown. There is no tunneling or undermining noted. There is a large amount of serous drainage noted. The wound margin is flat and intact. There is medium (34-66%) red granulation within the wound bed. There is a medium (34-66%) amount of necrotic tissue within the wound bed including Eschar and Adherent Slough. The periwound skin appearance exhibited: Erythema. The periwound skin appearance  did not exhibit: Callus, Crepitus, Excoriation, Induration, Rash, Scarring, Dry/Scaly, Maceration, Atrophie Blanche, Cyanosis, Ecchymosis, Hemosiderin Staining, Mottled, Pallor, Rubor. The surrounding wound skin color is noted with erythema which is circumferential. Periwound temperature was noted as No Abnormality. The periwound has tenderness on palpation. 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 There was a Skin/Subcutaneous Tissue Debridement ([id number]) debridement with total area of 42 sq cm performed by Christin Fudge, MD. with the following instrument(s): Curette to remove Viable and Non-Viable tissue/material including Fibrin/Slough, Eschar, and Subcutaneous after achieving pain control using Lidocaine 4% Topical Solution. A time out was conducted at 11:19, prior to the start of the procedure. A Minimum amount of bleeding was controlled with Pressure. The procedure was tolerated well with a pain level of 0 throughout and a pain level of 0 following the procedure. Post Debridement Measurements: 7.4cm length x 6.6cm width x 0.1cm depth; 3.836cm^3 volume. Character of Wound/Ulcer Post Debridement is improved. Severity of Tissue Post Debridement is: Fat layer exposed. Post procedure Diagnosis Wound #1: Same as Pre-Procedure Plan Wound Cleansing: Wound #1 Left,Medial Lower Leg: Clean wound with Normal Saline. Cleanse wound with mild soap and water May Shower, gently pat wound dry prior to applying new dressing. Anesthetic: Wound #1 Left,Medial Lower Leg: Topical Lidocaine 4% cream applied to wound bed prior to debridement Primary Wound Dressing: Wound #1 Left,Medial Lower Leg: Santyl Ointment Secondary Dressing: Wound #1 Left,Medial Lower Leg: [name] [name], [name] [name] ([id number]) ABD pad Other - tape Dressing Change Frequency: Wound #1 Left,Medial Lower Leg: Change dressing every day. Follow-up Appointments: Wound #1 Left,Medial Lower Leg: Return Appointment in 1 week. Nurse Visit as needed Edema Control: Wound #1 Left,Medial Lower Leg: Elevate legs to the level of the heart and pump ankles as often as possible Medications-please add to medication list.: Wound #1 Left,Medial Lower Leg: Santyl Enzymatic Ointment The following medication(s) was  prescribed: Santyl topical 250 unit/gram ointment ointment topical as directed starting 06/26/2016 After review I have recommended: 1. Santyl ointment and a light Kerlix and Coban wrap, to be changed daily. 2. Elevation and exercise 3. Good control of his diabetes mellitus 4. Review next week in the wound center Electronic Signature(s) Signed: 06/26/2016 12:04:01 PM By: Christin Fudge MD, FACS Entered By: Christin Fudge on 06/26/2016 12:04:00 [name] [name] ([id number]) -------------------------------------------------------------------------------- SuperBill Details Patient Name: [name] [name]. Date of Service: 06/26/2016 Medical Record Number: [id number] Patient Account Number: [id number] Date of Birth/Sex: 18-Apr-1976 (41 y.o. 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 Facility Procedures CPT4 Code Description: [id number] 11042 - DEB SUBQ TISSUE 20 SQ CM/< ICD-10 Description Diagnosis E11.622 Type 2 diabetes mellitus with other skin ulcer Z99.2 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 Modifier: breakdown of s Quantity: 1 kin CPT4 Code Description: [id number] 11045 - DEB SUBQ TISS EA ADDL 20CM ICD-10 Description Diagnosis E11.622 Type 2 diabetes mellitus with other skin ulcer Z99.2  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 Modifier: breakdown of s Quantity: 2 kin Physician Procedures CPT4 Code Description: 1102111 73567 - WC PHYS SUBQ TISS 20 SQ CM ICD-10 Description Diagnosis E11.622 Type 2 diabetes mellitus with  other skin ulcer Z99.2 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

## 2016-06-27 NOTE — Progress Notes (Signed)
[name] [name], [name] [name] ([id number]) Visit Report for 06/26/2016 Arrival Information Details Patient Name: [name] [name], [name] [name]. Date of Service: 06/26/2016 11:00 AM Medical Record Number: [id number] Patient Account Number: [id number] Date of Birth/Sex: 20-Aug-1975 (41 y.o. Male) Treating RN: Montey Hora Primary Care Shamar Kracke: Kasandra Knudsen Other Clinician: Referring Caria Transue: Kasandra Knudsen Treating Offie Pickron/Extender: Frann Rider in Treatment: 3 Visit Information History Since Last Visit Added or deleted any medications: No Patient Arrived: Ambulatory Any new allergies or adverse reactions: No Arrival Time: 11:06 Had a fall or experienced change in No Accompanied By: self activities of daily living that may affect Transfer Assistance: None risk of falls: Patient Identification Verified: Yes Signs or symptoms of abuse/neglect since last No Secondary Verification Process Yes visito Completed: Hospitalized since last visit: No Patient Has Alerts: Yes Has Dressing in Place as Prescribed: Yes Patient Alerts: Patient on Blood Pain Present Now: No Thinner DMII Electronic Signature(s) Signed: 06/26/2016 4:53:39 PM By: Montey Hora Entered By: Montey Hora on 06/26/2016 11:06:20 [name] [name] ([id number]) -------------------------------------------------------------------------------- Encounter Discharge Information Details Patient Name: [name] [name]. Date of Service: 06/26/2016 11:00 AM Medical Record Number: [id number] Patient Account Number: [id number] Date of Birth/Sex: Aug 05, 1975 (41 y.o. Male) Treating RN: Montey Hora Primary Care Akaya Proffit: Kasandra Knudsen Other Clinician: Referring Sunni Richardson: Kasandra Knudsen Treating Jayli Fogleman/Extender: Frann Rider in Treatment: 3 Encounter Discharge Information Items Discharge Pain Level: 0 Discharge Condition: Stable Ambulatory Status: Ambulatory Discharge Destination: Home Transportation: Private Auto Accompanied  By: self Schedule Follow-up Appointment: Yes Medication Reconciliation completed and provided to Patient/Care No Renell Allum: Provided on Clinical Summary of Care: 06/26/2016 Form Type Recipient Paper Patient LT Electronic Signature(s) Signed: 06/26/2016 2:47:54 PM By: Montey Hora Previous Signature: 06/26/2016 11:37:10 AM Version By: Ruthine Dose Entered By: Montey Hora on 06/26/2016 14:47:53 [name] [name] ([id number]) -------------------------------------------------------------------------------- Lower Extremity Assessment Details Patient Name: [name] [name]. Date of Service: 06/26/2016 11:00 AM Medical Record Number: [id number] Patient Account Number: [id number] Date of Birth/Sex: Aug 24, 1975 (41 y.o. Male) Treating RN: Montey Hora Primary Care Rosalin Buster: Kasandra Knudsen Other Clinician: Referring Kahley Leib: Kasandra Knudsen Treating Milcah Dulany/Extender: Frann Rider in Treatment: 3 Edema Assessment Assessed: [Left: No] [Right: No] Edema: [Left: Ye] [Right: s] Calf Left: Right: Point of Measurement: 36 cm From Medial Instep 36.7 cm cm Ankle Left: Right: Point of Measurement: 12 cm From Medial Instep 23 cm cm Vascular Assessment Pulses: Dorsalis Pedis Palpable: [Left:Yes] Posterior Tibial Extremity colors, hair growth, and conditions: Extremity Color: [Left:Normal] Hair Growth on Extremity: [Left:Yes] Temperature of Extremity: [Left:Warm] Capillary Refill: [Left:< 3 seconds] Electronic Signature(s) Signed: 06/26/2016 4:53:39 PM By: Montey Hora Entered By: Montey Hora on 06/26/2016 11:16:10 [name] [name] ([id number]) -------------------------------------------------------------------------------- Multi Wound Chart Details Patient Name: [name] [name]. Date of Service: 06/26/2016 11:00 AM Medical Record Number: [id number] Patient Account Number: [id number] Date of Birth/Sex: 1975-05-17 (41 y.o. 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 N/A N/A Comorbid History: Hypertension, Type II N/A N/A Diabetes Date Acquired: 06/03/2016 N/A N/A Weeks of Treatment: 3 N/A N/A Wound Status: Open N/A N/A Measurements L x W x D 7.4x6.6x0.1 N/A N/A (cm) Area (cm) :  38.359 N/A N/A Volume (cm) : 3.836 N/A N/A % Reduction in Area: 46.90% N/A N/A % Reduction in Volume: 46.90% N/A N/A Classification: Grade 1 N/A N/A Exudate Amount: Large N/A N/A Exudate Type: Serous N/A N/A Exudate Color: amber N/A N/A Wound Margin: Flat and Intact N/A N/A Granulation Amount: Medium (34-66%) N/A N/A Granulation Quality: Red N/A N/A [name] [name], [name] [name] ([id number]) Necrotic Amount: Medium (34-66%) N/A N/A Necrotic Tissue: Eschar, Adherent Slough N/A N/A Exposed Structures: Fascia: No N/A N/A Fat Layer (Subcutaneous Tissue) Exposed: No Tendon: No Muscle: No Joint: No Bone: No Limited to Skin Breakdown Epithelialization: None N/A N/A Debridement: Debridement (36468- N/A N/A 11047) Pre-procedure 11:19 N/A N/A Verification/Time Out Taken: Pain Control: Lidocaine 4% Topical N/A N/A Solution Tissue Debrided: Necrotic/Eschar, N/A N/A Fibrin/Slough, Subcutaneous Level: Skin/Subcutaneous N/A N/A Tissue Debridement Area (sq 48.84 N/A N/A cm): Instrument: Curette N/A N/A Bleeding: Minimum N/A N/A Hemostasis Achieved: Pressure N/A N/A Procedural Pain: 0 N/A N/A Post Procedural Pain: 0 N/A N/A Debridement Treatment  Procedure was tolerated N/A N/A Response: well Post Debridement 7.4x6.6x0.1 N/A N/A Measurements L x W x D (cm) Post Debridement 3.836 N/A N/A Volume: (cm) Periwound Skin Texture: Excoriation: No N/A N/A Induration: No Callus: No Crepitus: No Rash: No Scarring: No Periwound Skin Maceration: No N/A N/A Moisture: Dry/Scaly: No Periwound Skin Color: Erythema: Yes N/A N/A Atrophie Blanche: No Cyanosis: No Ecchymosis: No [name] [name], [name] [name] ([id number]) Hemosiderin Staining: No Mottled: No Pallor: No Rubor: No Erythema Location: Circumferential N/A N/A Temperature: No Abnormality N/A N/A Tenderness on Yes N/A N/A Palpation: Wound Preparation: Ulcer Cleansing: N/A N/A Rinsed/Irrigated with Saline Topical Anesthetic Applied: Other: lidocaine 4% Procedures Performed: Debridement N/A N/A Treatment Notes Electronic Signature(s) Signed: 06/26/2016 12:01:22 PM By: Christin Fudge MD, FACS Entered By: Christin Fudge on 06/26/2016 12:01:22 [name] [name] ([id number]) -------------------------------------------------------------------------------- [place] Details Patient Name: [name] [name]. Date of Service: 06/26/2016 11:00 AM Medical Record Number: [id number] Patient Account Number: [id number] Date of Birth/Sex: 05-May-1975 (41 y.o. Male) Treating RN: Montey Hora Primary Care Preslie Depasquale: Kasandra Knudsen Other Clinician: Referring Dakarai Mcglocklin: Kasandra Knudsen Treating Raffaele Derise/Extender: Frann Rider in Treatment: 3 Active Inactive ` Orientation to the Wound Care Program Nursing Diagnoses: Knowledge deficit related to the wound healing center program Goals: Patient/caregiver will verbalize understanding of the [place] Program Date Initiated: 06/05/2016 Target Resolution Date: 08/29/2016 Goal Status: Active Interventions: Provide education on orientation to the wound center Notes: ` Wound/Skin Impairment Nursing Diagnoses: Knowledge  deficit related to ulceration/compromised skin integrity Goals: Patient/caregiver will verbalize understanding of skin care regimen Date Initiated: 06/05/2016 Target Resolution Date: 08/29/2016 Goal Status: Active Ulcer/skin breakdown will have a volume reduction of 30% by week 4 Date Initiated: 06/05/2016 Target Resolution Date: 08/29/2016 Goal Status: Active Ulcer/skin breakdown will have a volume reduction of 50% by week 8 Date Initiated: 06/05/2016 Target Resolution Date: 08/29/2016 Goal Status: Active Ulcer/skin breakdown will have a volume reduction of 80% by week 12 Date Initiated: 06/05/2016 Target Resolution Date: 08/29/2016 Goal Status: Active Ulcer/skin breakdown will heal within 14 weeks Date Initiated: 06/05/2016 Target Resolution Date: 08/29/2016 [name] [name], [name] [name] ([id number]) Goal Status: Active Interventions: Assess patient/caregiver ability to obtain necessary supplies Assess patient/caregiver ability to perform ulcer/skin care regimen upon admission and as needed Assess ulceration(s) every visit Notes: Electronic Signature(s) Signed: 06/26/2016 4:53:39 PM By: Montey Hora Entered By: Montey Hora on 06/26/2016 11:20:19 [name] [name] ([id number]) -------------------------------------------------------------------------------- Pain Assessment Details Patient Name: [name] [name]. Date of Service: 06/26/2016 11:00 AM Medical Record Number:  [id number] Patient Account Number: [id number] Date of Birth/Sex: 12-Mar-1976 (41 y.o. Male) Treating RN: Montey Hora Primary Care Athan Casalino: Kasandra Knudsen Other Clinician: Referring Cherylin Waguespack: Kasandra Knudsen Treating Floree Zuniga/Extender: Frann Rider in Treatment: 3 Active Problems Location of Pain Severity and Description of Pain Patient Has Paino No Site Locations Pain Management and Medication Current Pain Management: Notes Topical or injectable lidocaine is offered to patient for acute pain when surgical debridement is  performed. If needed, Patient is instructed to use over the counter pain medication for the following 24-48 hours after debridement. Wound care MDs do not prescribed pain medications. Patient has chronic pain or uncontrolled pain. Patient has been instructed to make an appointment with their Primary Care Physician for pain management. Electronic Signature(s) Signed: 06/26/2016 4:53:39 PM By: Montey Hora Entered By: Montey Hora on 06/26/2016 11:06:27 [name] [name] ([id number]) -------------------------------------------------------------------------------- Patient/Caregiver Education Details Patient Name: [name] [name]. Date of Service: 06/26/2016 11:00 AM Medical Record Number: [id number] Patient Account Number: [id number] Date of Birth/Gender: [birth year] (41 y.o. Male) Treating RN: Montey Hora Primary Care Physician: Kasandra Knudsen Other Clinician: Referring Physician: Kasandra Knudsen Treating Physician/Extender: Frann Rider in Treatment: 3 Education Assessment Education Provided To: Patient Education Topics Provided Wound/Skin Impairment: Handouts: Other: wound care as ordered Methods: Demonstration, Explain/Verbal Responses: State content correctly Electronic Signature(s) Signed: 06/26/2016 4:53:39 PM By: Montey Hora Entered By: Montey Hora on 06/26/2016 14:48:10 [name] [name] ([id number]) -------------------------------------------------------------------------------- Wound Assessment Details Patient Name: [name] [name]. Date of Service: 06/26/2016 11:00 AM Medical Record Number: [id number] Patient Account Number: [id number] Date of Birth/Sex: Nov 01, 1975 (41 y.o. Male) Treating RN: Montey Hora Primary Care Zillah Alexie: Kasandra Knudsen Other Clinician: Referring Shalona Harbour: Kasandra Knudsen Treating Fairy Ashlock/Extender: Frann Rider in Treatment: 3 Wound Status Wound Number: 1 Primary Etiology: Diabetic Wound/Ulcer of the  Lower Extremity Wound Location: Left Lower Leg - Medial Secondary Venous Leg Ulcer Wounding Event: Blister Etiology: Date Acquired: 06/03/2016 Wound Status: Open Weeks Of Treatment: 3 Comorbid Hypertension, Type II Diabetes Clustered Wound: No History: Photos Photo Uploaded By: Montey Hora on 06/26/2016 11:38:40 Wound Measurements Length: (cm) 7.4 Width: (cm) 6.6 Depth: (cm) 0.1 Area: (cm) 38.359 Volume: (cm) 3.836 % Reduction in Area: 46.9% % Reduction in Volume: 46.9% Epithelialization: None Tunneling: No Undermining: No Wound Description Classification: Grade 1 Wound Margin: Flat and Intact Exudate Amount: Large Exudate Type: Serous Exudate Color: amber Foul Odor After Cleansing: No Slough/Fibrino No Wound Bed Granulation Amount: Medium (34-66%) Exposed Structure Granulation Quality: Red Fascia Exposed: No Necrotic Amount: Medium (34-66%) Fat Layer (Subcutaneous Tissue) Exposed: No Necrotic Quality: Eschar, Adherent Slough Tendon Exposed: No [name] [name], [name] [name] ([id number]) Muscle Exposed: No Joint Exposed: No Bone Exposed: No Limited to Skin Breakdown Periwound Skin Texture Texture Color No Abnormalities Noted: No No Abnormalities Noted: No Callus: No Atrophie Blanche: No Crepitus: No Cyanosis: No Excoriation: No Ecchymosis: No Induration: No Erythema: Yes Rash: No Erythema Location: Circumferential Scarring: No Hemosiderin Staining: No Mottled: No Moisture Pallor: No No Abnormalities Noted: No Rubor: No Dry / Scaly: No Maceration: No Temperature / Pain Temperature: No Abnormality Tenderness on Palpation: Yes Wound Preparation Ulcer Cleansing: Rinsed/Irrigated with Saline Topical Anesthetic Applied: Other: lidocaine 4%, Treatment Notes Wound #1 (Left, Medial Lower Leg) 1. Cleansed with: Clean wound with Normal Saline 2. Anesthetic Topical Lidocaine 4% cream to wound bed prior to debridement 4. Dressing Applied: Santyl  Ointment 5. Secondary Dressing Applied ABD Pad 7. Secured with Recruitment consultant) Signed: 06/26/2016 4:53:39 PM By: Montey Hora Entered By: Montey Hora on 06/26/2016  11:19:53 [name] [name], [name] [name] ([id number]) -------------------------------------------------------------------------------- Vitals Details Patient Name: [name] [name], [name] [name]. Date of Service: 06/26/2016 11:00 AM Medical Record Number: [id number] Patient Account Number: [id number] Date of Birth/Sex: November 27, 1975 (41 y.o. Male) Treating RN: Montey Hora Primary Care Kumiko Fishman: Kasandra Knudsen Other Clinician: Referring Haywood Meinders: Kasandra Knudsen Treating Desteny Freeman/Extender: Frann Rider in Treatment: 3 Vital Signs Time Taken: 11:09 Temperature (F): 98.3 Height (in): 73 Pulse (bpm): 84 Weight (lbs): 285 Respiratory Rate (breaths/min): 18 Body Mass Index (BMI): 37.6 Blood Pressure (mmHg): 134/73 Reference Range: 80 - 120 mg / dl Electronic Signature(s) Signed: 06/26/2016 4:53:39 PM By: Montey Hora Entered By: Montey Hora on 06/26/2016 11:09:40

## 2016-07-03 ENCOUNTER — Encounter: Payer: Medicare Other | Admitting: Surgery

## 2016-07-03 DIAGNOSIS — E11622 Type 2 diabetes mellitus with other skin ulcer: Secondary | ICD-10-CM | POA: Diagnosis not present

## 2016-07-04 NOTE — Progress Notes (Signed)
[name], [name] ([id number]) Visit Report for 07/03/2016 Chief Complaint Document Details Patient Name: [name] [name], [name] [name]. Date of Service: 07/03/2016 11:00 AM Medical Record Number: [id number] Patient Account Number: [id number] Date of Birth/Sex: [birth year] (41 y.o. Male) Treating RN: Montey Hora Primary Care Provider: Kasandra Knudsen Other Clinician: Referring Provider: Kasandra Knudsen Treating Provider/Extender: Frann Rider in Treatment: 4 Information Obtained from: Patient Chief Complaint Patients presents for treatment of an open diabetic ulcer the left lower extremity for 5 days Electronic Signature(s) Signed: 07/03/2016 11:28:11 AM By: Christin Fudge MD, FACS Entered By: Christin Fudge on 07/03/2016 11:28:11 [name] [name] ([id number]) -------------------------------------------------------------------------------- Debridement Details Patient Name: [name] [name]. Date of Service: 07/03/2016 11:00 AM Medical Record Number: [id number] Patient Account Number: [id number] Date of Birth/Sex: [birth year] (41 y.o. Male) Treating RN: Montey Hora Primary Care Provider: Kasandra Knudsen Other Clinician: Referring Provider: Kasandra Knudsen Treating Provider/Extender: Frann Rider in Treatment: 4 Debridement Performed for Wound #1 Left,Medial Lower Leg Assessment: Performed By: Physician Christin Fudge, MD Debridement: Debridement Pre-procedure Yes - 11:24 Verification/Time Out Taken: Start Time: 11:24 Pain Control: Lidocaine 4% Topical Solution Level: Skin/Subcutaneous Tissue Total Area Debrided (L x 7 (cm) x 6 (cm) = 42 (cm) W): Tissue and other Viable, Non-Viable, Fibrin/Slough, Subcutaneous material debrided: Instrument: Curette Bleeding: Minimum Hemostasis Achieved: Pressure End Time: 11:26 Procedural Pain: 0 Post Procedural Pain: 0 Response to Treatment: Procedure was tolerated well Post Debridement Measurements of Total Wound Length: (cm)  7.3 Width: (cm) 6.4 Depth: (cm) 0.2 Volume: (cm) 7.339 Character of Wound/Ulcer Post Improved Debridement: Severity of Tissue Post Debridement: Fat layer exposed Post Procedure Diagnosis Same as Pre-procedure Electronic Signature(s) Signed: 07/03/2016 11:28:02 AM By: Christin Fudge MD, FACS Signed: 07/03/2016 5:22:00 PM By: Montey Hora Entered By: Christin Fudge on 07/03/2016 11:28:02 [name] [name] ([id number]) [name] [name] ([id number]) -------------------------------------------------------------------------------- HPI Details Patient Name: [name] [name]. Date of Service: 07/03/2016 11:00 AM Medical Record Number: [id number] Patient Account Number: [id number] Date of Birth/Sex: 1975-08-10 (41 y.o. Male) Treating RN: Montey Hora Primary Care Provider: Kasandra Knudsen Other Clinician: Referring Provider: Kasandra Knudsen Treating Provider/Extender: Frann Rider in Treatment: 4 History of Present Illness Location: left lower extremity Quality: Patient reports experiencing a dull pain to affected area(s). Severity: Patient states wound (s) are getting better. Duration: Patient has had the wound for < 2 weeks prior to presenting for treatment Timing: Pain in wound is Intermittent (comes and goes Context: The wound appeared gradually over time Modifying Factors: Other treatment(s) tried include:local care and oral antibiotic Associated Signs and Symptoms: Patient reports having increase swelling. HPI Description: 41 year old gentleman who has noticed a blister on his left lower extremity which came up 5 days ago and it has drained fluid. He was seen by hispractitioner who reviewed him and put him on doxycycline now asked him to come to the wound center. His past medical history is significant for diabetes mellitus type 2, congestive heart failure, renal failure, hypertension, and he is receiving hemodialysis 3 times a week. He has never been a smoker.Marland Kitchen 06/12/2016  -- the patient has in continue with hemodialysis and no changes have occurred in the last week. 07/03/2016 -- the patient has been doing his dressings only every other day and has not been washing was scrubbing the wound as advised Electronic Signature(s) Signed: 07/03/2016 11:28:44 AM By: Christin Fudge MD, FACS Entered By: Christin Fudge on 07/03/2016 11:28:44 [name] [name] ([id number]) -------------------------------------------------------------------------------- Physical Exam Details Patient Name: [name] [name]. Date of Service: 07/03/2016 11:00 AM Medical  Record Number: [id number] Patient Account Number: [id number] Date of Birth/Sex: 1975/06/06 (41 y.o. Male) Treating RN: Montey Hora Primary Care Provider: Kasandra Knudsen Other Clinician: Referring Provider: Kasandra Knudsen Treating Provider/Extender: Frann Rider in Treatment: 4 Constitutional . Pulse regular. Respirations normal and unlabored. Afebrile. . Eyes Nonicteric. Reactive to light. Ears, Nose, Mouth, and Throat Lips, teeth, and gums WNL.Marland Kitchen Moist mucosa without lesions. Neck supple and nontender. No palpable supraclavicular or cervical adenopathy. Normal sized without goiter. Respiratory WNL. No retractions.. Breath sounds WNL, No rubs, rales, rhonchi, or wheeze.. Cardiovascular Heart rhythm and rate regular, no murmur or gallop.. Pedal Pulses WNL. No clubbing, cyanosis or edema. Chest Breasts symmetical and no nipple discharge.. Breast tissue WNL, no masses, lumps, or tenderness.. Genitourinary (GU) No hydrocele, spermatocele, tenderness of the cord, or testicular mass.Marland Kitchen Penis without lesions.[name] [name] without lesions. No cystocele, or rectocele. Pelvic support intact, no discharge.Marland Kitchen Urethra without masses, tenderness or scarring.Marland Kitchen Lymphatic No adneopathy. No adenopathy. No adenopathy. Musculoskeletal Adexa without tenderness or enlargement.. Digits and nails w/o clubbing, cyanosis, infection,  petechiae, ischemia, or inflammatory conditions.. Integumentary (Hair, Skin) No suspicious lesions. No crepitus or fluctuance. No peri-wound warmth or erythema. No masses.Marland Kitchen Psychiatric Judgement and insight Intact.. No evidence of depression, anxiety, or agitation.. Notes the wound has significant amount of subcutaneous debris and also has an area around it which shows signs of stasis dermatitis. sharp debridement was done with a #3 curet till the tenderness was significant. No bleeding Electronic Signature(s) Signed: 07/03/2016 11:29:52 AM By: Christin Fudge MD, FACS [name] [name] ([id number]) Entered By: Christin Fudge on 07/03/2016 11:29:52 [name] [name] ([id number]) -------------------------------------------------------------------------------- Physician Orders Details Patient Name: [name] [name]. Date of Service: 07/03/2016 11:00 AM Medical Record Number: [id number] Patient Account Number: [id number] Date of Birth/Sex: 12/12/1975 (41 y.o. Male) Treating RN: Montey Hora Primary Care Provider: Kasandra Knudsen Other Clinician: Referring Provider: Kasandra Knudsen Treating Provider/Extender: Frann Rider in Treatment: 4 Verbal / Phone Orders: No Diagnosis Coding Wound Cleansing Wound #1 Left,Medial Lower Leg o Clean wound with Normal Saline. o Cleanse wound with mild soap and water o May Shower, gently pat wound dry prior to applying new dressing. Anesthetic Wound #1 Left,Medial Lower Leg o Topical Lidocaine 4% cream applied to wound bed prior to debridement Primary Wound Dressing Wound #1 Left,Medial Lower Leg o Santyl Ointment Secondary Dressing Wound #1 Left,Medial Lower Leg o ABD pad o Other - tape Dressing Change Frequency Wound #1 Left,Medial Lower Leg o Change dressing every day. Follow-up Appointments Wound #1 Left,Medial Lower Leg o Return Appointment in 1 week. o Nurse Visit as needed Edema Control Wound #1 Left,Medial  Lower Leg o Elevate legs to the level of the heart and pump ankles as often as possible Medications-please add to medication list. Wound #1 Left,Medial Lower Leg o Santyl Enzymatic Ointment [name] [name], [name] [name] ([id number]) Services and Therapies o Venous Studies -Bilateral Electronic Signature(s) Signed: 07/03/2016 4:19:30 PM By: Christin Fudge MD, FACS Signed: 07/03/2016 5:22:00 PM By: Montey Hora Entered By: Montey Hora on 07/03/2016 11:26:35 [name] [name] ([id number]) -------------------------------------------------------------------------------- Problem List Details Patient Name: [name] [name]. Date of Service: 07/03/2016 11:00 AM Medical Record Number: [id number] Patient Account Number: [id number] Date of Birth/Sex: Jan 17, 1976 (41 y.o. Male) Treating RN: Montey Hora Primary Care Provider: Kasandra Knudsen Other Clinician: Referring Provider: Kasandra Knudsen Treating Provider/Extender: Frann Rider in Treatment: 4 Active Problems ICD-10 Encounter Code Description Active Date Diagnosis E11.622 Type 2 diabetes mellitus with other skin ulcer 06/05/2016 Yes Z99.2 Dependence on renal dialysis  06/05/2016 Yes L97.222 Non-pressure chronic ulcer of left calf with fat layer 07/03/2016 Yes exposed Inactive Problems Resolved Problems Electronic Signature(s) Signed: 07/03/2016 11:33:26 AM By: Christin Fudge MD, FACS Previous Signature: 07/03/2016 11:27:34 AM Version By: Christin Fudge MD, FACS Entered By: Christin Fudge on 07/03/2016 11:33:26 [name] [name] ([id number]) -------------------------------------------------------------------------------- Progress Note Details Patient Name: [name] [name]. Date of Service: 07/03/2016 11:00 AM Medical Record Number: [id number] Patient Account Number: [id number] Date of Birth/Sex: December 29, 1975 (41 y.o. Male) Treating RN: Montey Hora Primary Care Provider: Kasandra Knudsen Other Clinician: Referring Provider: Kasandra Knudsen Treating Provider/Extender: Frann Rider in Treatment: 4 Subjective Chief Complaint Information obtained from Patient Patients presents for treatment of an open diabetic ulcer the left lower extremity for 5 days History of Present Illness (HPI) The following HPI elements were documented for the patient's wound: Location: left lower extremity Quality: Patient reports experiencing a dull pain to affected area(s). Severity: Patient states wound (s) are getting better. Duration: Patient has had the wound for < 2 weeks prior to presenting for treatment Timing: Pain in wound is Intermittent (comes and goes Context: The wound appeared gradually over time Modifying Factors: Other treatment(s) tried include:local care and oral antibiotic Associated Signs and Symptoms: Patient reports having increase swelling. 41 year old gentleman who has noticed a blister on his left lower extremity which came up 5 days ago and it has drained fluid. He was seen by hispractitioner who reviewed him and put him on doxycycline now asked him to come to the wound center. His past medical history is significant for diabetes mellitus type 2, congestive heart failure, renal failure, hypertension, and he is receiving hemodialysis 3 times a week. He has never been a smoker.Marland Kitchen 06/12/2016 -- the patient has in continue with hemodialysis and no changes have occurred in the last week. 07/03/2016 -- the patient has been doing his dressings only every other day and has not been washing was scrubbing the wound as advised Objective Constitutional Pulse regular. Respirations normal and unlabored. Afebrile. Vitals Time Taken: 11:05 AM, Height: 73 in, Weight: 285 lbs, BMI: 37.6, Temperature: 98.1 F, Pulse: 81 bpm, Respiratory Rate: 16 breaths/min, Blood Pressure: 125/75 mmHg. [name] [name], [name] D. ([id number]) Eyes Nonicteric. Reactive to light. Ears, Nose, Mouth, and Throat Lips, teeth, and gums WNL.Marland Kitchen Moist mucosa  without lesions. Neck supple and nontender. No palpable supraclavicular or cervical adenopathy. Normal sized without goiter. Respiratory WNL. No retractions.. Breath sounds WNL, No rubs, rales, rhonchi, or wheeze.. Cardiovascular Heart rhythm and rate regular, no murmur or gallop.. Pedal Pulses WNL. No clubbing, cyanosis or edema. Chest Breasts symmetical and no nipple discharge.. Breast tissue WNL, no masses, lumps, or tenderness.. Genitourinary (GU) No hydrocele, spermatocele, tenderness of the cord, or testicular mass.Marland Kitchen Penis without lesions.[name] [name] without lesions. No cystocele, or rectocele. Pelvic support intact, no discharge.Marland Kitchen Urethra without masses, tenderness or scarring.Marland Kitchen Lymphatic No adneopathy. No adenopathy. No adenopathy. Musculoskeletal Adexa without tenderness or enlargement.. Digits and nails w/o clubbing, cyanosis, infection, petechiae, ischemia, or inflammatory conditions.Marland Kitchen Psychiatric Judgement and insight Intact.. No evidence of depression, anxiety, or agitation.. General Notes: the wound has significant amount of subcutaneous debris and also has an area around it which shows signs of stasis dermatitis. sharp debridement was done with a #3 curet till the tenderness was significant. No bleeding Integumentary (Hair, Skin) No suspicious lesions. No crepitus or fluctuance. No peri-wound warmth or erythema. No masses.. Wound #1 status is Open. Original cause of wound was Blister. The wound is located on the Left,Medial Lower Leg.  The wound measures 7.3cm length x 6.4cm width x 0.2cm depth; 36.694cm^2 area and 7.339cm^3 volume. The wound is limited to skin breakdown. There is no tunneling or undermining noted. There is a large amount of serous drainage noted. The wound margin is flat and intact. There is no granulation within the wound bed. There is a large (67-100%) amount of necrotic tissue within the wound bed including Eschar and Adherent Slough. The periwound  skin appearance exhibited: Erythema. The periwound skin appearance did not exhibit: Callus, Crepitus, Excoriation, Induration, Rash, Scarring, Dry/Scaly, Maceration, Atrophie Blanche, Cyanosis, Ecchymosis, Hemosiderin Staining, Mottled, Pallor, Rubor. The surrounding wound skin color is noted with erythema which is circumferential. ACELIN, [name] D. ([id number]) temperature was noted as No Abnormality. The periwound has tenderness on palpation. Assessment Active Problems ICD-10 E11.622 - Type 2 diabetes mellitus with other skin ulcer Z99.2 - Dependence on renal dialysis L97.222 - Non-pressure chronic ulcer of left calf with fat layer exposed We have seen this diabetic patient on hemodialysis with a ulcer on his left lower extremity for 4 weeks now, which initially started as a blister and now has progressed to getting his subcutaneous tissue involved. at this stage I would like to look for venous causes and also do a biopsy of the wound edges to rule out calciphylaxis. I have recommended: 1. Santyl ointment and a light Kerlix and Coban wrap, to be changed daily. 2. Elevation and exercise 3. Good control of his diabetes mellitus 4. venous duplex reflux study of both lower extremities 5. Review next week in the wound center -- I will plan a biopsy of skin edges under local anesthesia Procedures Wound #1 Wound #1 is a Diabetic Wound/Ulcer of the Lower Extremity located on the Left,Medial Lower Leg . There was a Skin/Subcutaneous Tissue Debridement ([id number]) debridement with total area of 42 sq cm performed by Christin Fudge, MD. with the following instrument(s): Curette to remove Viable and Non-Viable tissue/material including Fibrin/Slough and Subcutaneous after achieving pain control using Lidocaine 4% Topical Solution. A time out was conducted at 11:24, prior to the start of the procedure. A Minimum amount of bleeding was controlled with Pressure. The procedure was tolerated  well with a pain level of 0 throughout and a pain level of 0 following the procedure. Post Debridement Measurements: 7.3cm length x 6.4cm width x 0.2cm depth; 7.339cm^3 volume. Character of Wound/Ulcer Post Debridement is improved. Severity of Tissue Post Debridement is: Fat layer exposed. Post procedure Diagnosis Wound #1: Same as Pre-Procedure [name] [name], [name] [name] ([id number]) Plan Wound Cleansing: Wound #1 Left,Medial Lower Leg: Clean wound with Normal Saline. Cleanse wound with mild soap and water May Shower, gently pat wound dry prior to applying new dressing. Anesthetic: Wound #1 Left,Medial Lower Leg: Topical Lidocaine 4% cream applied to wound bed prior to debridement Primary Wound Dressing: Wound #1 Left,Medial Lower Leg: Santyl Ointment Secondary Dressing: Wound #1 Left,Medial Lower Leg: ABD pad Other - tape Dressing Change Frequency: Wound #1 Left,Medial Lower Leg: Change dressing every day. Follow-up Appointments: Wound #1 Left,Medial Lower Leg: Return Appointment in 1 week. Nurse Visit as needed Edema Control: Wound #1 Left,Medial Lower Leg: Elevate legs to the level of the heart and pump ankles as often as possible Medications-please add to medication list.: Wound #1 Left,Medial Lower Leg: Santyl Enzymatic Ointment Services and Therapies ordered were: Venous Studies -Bilateral We have seen this diabetic patient on hemodialysis with a ulcer on his left lower extremity for 4 weeks now, which initially started as a blister and now  has progressed to getting his subcutaneous tissue involved. at this stage I would like to look for venous causes and also do a biopsy of the wound edges to rule out calciphylaxis. I have recommended: 1. Santyl ointment and a light Kerlix and Coban wrap, to be changed daily. [name] [name], [name] D. ([id number]) 2. Elevation and exercise 3. Good control of his diabetes mellitus 4. venous duplex reflux study of both lower extremities 5. Review  next week in the wound center -- I will plan a biopsy of skin edges under local anesthesia Electronic Signature(s) Signed: 07/03/2016 11:33:42 AM By: Christin Fudge MD, FACS Previous Signature: 07/03/2016 11:32:48 AM Version By: Christin Fudge MD, FACS Entered By: Christin Fudge on 07/03/2016 11:33:41 [name] [name] ([id number]) -------------------------------------------------------------------------------- SuperBill Details Patient Name: [name] [name]. Date of Service: 07/03/2016 Medical Record Number: [id number] Patient Account Number: [id number] Date of Birth/Sex: Aug 29, 1975 (41 y.o. 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 Facility Procedures CPT4 Code: [id number] Description: 67209 - DEB SUBQ TISSUE 20 SQ CM/< ICD-10 Description Diagnosis E11.622 Type 2 diabetes mellitus with other skin ulcer Z99.2 Dependence on renal dialysis L97.222 Non-pressure chronic ulcer of left calf with fat l Modifier: ayer exposed Quantity: 1 CPT4 Code: [id number] Description: 11045 - DEB SUBQ TISS EA ADDL 20CM ICD-10 Description Diagnosis E11.622 Type 2 diabetes mellitus with other skin ulcer Z99.2 Dependence on renal dialysis L97.222 Non-pressure chronic ulcer of left calf with fat l Modifier: ayer exposed Quantity: 2 Physician Procedures CPT4 Code: [id number] Description: 11042 - WC PHYS SUBQ TISS 20 SQ CM ICD-10 Description Diagnosis E11.622 Type 2 diabetes mellitus with other skin ulcer Z99.2 Dependence on renal dialysis L97.222 Non-pressure chronic ulcer of left calf with fat la Modifier: yer exposed Quantity: 1 CPT4 Code:  [id number] [name] [name] Description: 11045 - WC PHYS SUBQ TISS EA ADDL 20 CM Description E D. ([id number]) Modifier: Quantity: 2 Electronic Signature(s) Signed: 07/03/2016 11:33:56 AM By: Christin Fudge MD, FACS Entered By: Christin Fudge on 07/03/2016 11:33:56

## 2016-07-04 NOTE — Progress Notes (Signed)
BIRAN, MAYBERRY ([id number]) Visit Report for 07/03/2016 Arrival Information Details Patient Name: [name] [name], [name] [name]. Date of Service: 07/03/2016 11:00 AM Medical Record Number: [id number] Patient Account Number: [id number] Date of Birth/Sex: [birth year] (41 y.o. Male) Treating RN: Montey Hora Primary Care Smita Lesh: Kasandra Knudsen Other Clinician: Referring Everhett Bozard: Kasandra Knudsen Treating Zamir Staples/Extender: Frann Rider in Treatment: 4 Visit Information History Since Last Visit Added or deleted any medications: No Patient Arrived: Ambulatory Any new allergies or adverse reactions: No Arrival Time: 11:02 Had a fall or experienced change in No Accompanied By: self activities of daily living that may affect Transfer Assistance: None risk of falls: Patient Identification Verified: Yes Signs or symptoms of abuse/neglect since last No Secondary Verification Process Yes visito Completed: Hospitalized since last visit: No Patient Has Alerts: Yes Has Dressing in Place as Prescribed: Yes Patient Alerts: Patient on Blood Pain Present Now: No Thinner DMII Electronic Signature(s) Signed: 07/03/2016 5:22:00 PM By: Montey Hora Entered By: Montey Hora on 07/03/2016 11:02:43 [name] [name] ([id number]) -------------------------------------------------------------------------------- Encounter Discharge Information Details Patient Name: [name] [name]. Date of Service: 07/03/2016 11:00 AM Medical Record Number: [id number] Patient Account Number: [id number] Date of Birth/Sex: [birth year] (41 y.o. Male) Treating RN: Montey Hora Primary Care Saivon Prowse: Kasandra Knudsen Other Clinician: Referring Treasa Bradshaw: Kasandra Knudsen Treating Farra Nikolic/Extender: Frann Rider in Treatment: 4 Encounter Discharge Information Items Discharge Pain Level: 0 Discharge Condition: Stable Ambulatory Status: Ambulatory Discharge Destination: Home Transportation: Private Auto Accompanied  By: self Schedule Follow-up Appointment: Yes Medication Reconciliation completed and provided to Patient/Care No [place]: Provided on Clinical Summary of Care: 07/03/2016 Form Type Recipient Paper Patient LT Electronic Signature(s) Signed: 07/03/2016 11:39:32 AM By: Ruthine Dose Entered By: Ruthine Dose on 07/03/2016 11:39:32 [name] [name] ([id number]) -------------------------------------------------------------------------------- Lower Extremity Assessment Details Patient Name: [name] [name]. Date of Service: 07/03/2016 11:00 AM Medical Record Number: [id number] Patient Account Number: [id number] Date of Birth/Sex: 07-21-1975 (41 y.o. Male) Treating RN: Montey Hora Primary Care Handsome Anglin: Kasandra Knudsen Other Clinician: Referring Belva Koziel: Kasandra Knudsen Treating Mcgregor Tinnon/Extender: Frann Rider in Treatment: 4 Edema Assessment Assessed: [Left: No] [Right: No] Edema: [Left: N] [Right: o] Vascular Assessment Pulses: Dorsalis Pedis Palpable: [Left:Yes] Posterior Tibial Extremity colors, hair growth, and conditions: Extremity Color: [Left:Hyperpigmented] Hair Growth on Extremity: [Left:No] Temperature of Extremity: [Left:Warm] Capillary Refill: [Left:< 3 seconds] Electronic Signature(s) Signed: 07/03/2016 5:22:00 PM By: Montey Hora Entered By: Montey Hora on 07/03/2016 11:10:56 [name] [name] ([id number]) -------------------------------------------------------------------------------- Multi Wound Chart Details Patient Name: [name] [name]. Date of Service: 07/03/2016 11:00 AM Medical Record Number: [id number] Patient Account Number: [id number] Date of Birth/Sex: 1975-05-14 (41 y.o. 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 N/A N/A Comorbid History: Hypertension, Type II N/A N/A Diabetes Date Acquired: 06/03/2016 N/A N/A Weeks of Treatment: 4 N/A N/A Wound Status: Open N/A N/A Measurements L x W x D 7.3x6.4x0.2 N/A N/A (cm) Area (cm) : 36.694 N/A N/A Volume (cm) : 7.339 N/A N/A % Reduction in Area: 49.20% N/A N/A % Reduction in Volume: -1.60% N/A N/A Classification: Grade 1 N/A N/A Exudate Amount: Large N/A N/A Exudate Type: Serous N/A  N/A Exudate Color: amber N/A N/A Wound Margin: Flat and Intact N/A N/A Granulation Amount: None Present (0%) N/A N/A Necrotic Amount: Large (67-100%) N/A N/A AREK, SPADAFORE ([id number]) Necrotic Tissue: Eschar, Adherent Slough N/A N/A Exposed Structures: Fascia: No N/A N/A Fat Layer (Subcutaneous Tissue) Exposed: No Tendon: No Muscle: No Joint: No Bone: No Limited to Skin Breakdown Epithelialization: None N/A N/A Debridement: Debridement (22979- N/A N/A 11047) Pre-procedure 11:24 N/A N/A Verification/Time Out Taken: Pain Control: Lidocaine 4% Topical N/A N/A Solution Tissue Debrided: Fibrin/Slough, N/A N/A Subcutaneous Level: Skin/Subcutaneous N/A N/A Tissue Debridement Area (sq 46.72 N/A N/A cm): Instrument: Curette N/A N/A Bleeding: Minimum N/A N/A Hemostasis Achieved: Pressure N/A N/A Procedural Pain: 0 N/A N/A Post Procedural Pain: 0 N/A N/A Debridement Treatment Procedure was tolerated N/A N/A Response: well Post Debridement 7.3x6.4x0.2 N/A N/A Measurements L x W x D (cm) Post Debridement 7.339 N/A N/A Volume: (cm) Periwound Skin Texture: Excoriation: No N/A N/A Induration: No Callus: No Crepitus: No Rash:  No Scarring: No Periwound Skin Maceration: No N/A N/A Moisture: Dry/Scaly: No Periwound Skin Color: Erythema: Yes N/A N/A Atrophie Blanche: No Cyanosis: No Ecchymosis: No Hemosiderin Staining: No Mottled: No [name] [name], [name] [name] ([id number]) Pallor: No Rubor: No Erythema Location: Circumferential N/A N/A Temperature: No Abnormality N/A N/A Tenderness on Yes N/A N/A Palpation: Wound Preparation: Ulcer Cleansing: N/A N/A Rinsed/Irrigated with Saline Topical Anesthetic Applied: Other: lidocaine 4% Procedures Performed: Debridement N/A N/A Treatment Notes Electronic Signature(s) Signed: 07/03/2016 11:27:39 AM By: Christin Fudge MD, FACS Entered By: Christin Fudge on 07/03/2016 11:27:39 [name] [name] ([id number]) -------------------------------------------------------------------------------- [place] Details Patient Name: [name] [name]. Date of Service: 07/03/2016 11:00 AM Medical Record Number: [id number] Patient Account Number: [id number] Date of Birth/Sex: 05-Sep-1975 (41 y.o. Male) Treating RN: Montey Hora Primary Care Tallyn Holroyd: Kasandra Knudsen Other Clinician: Referring Shanteria Laye: Kasandra Knudsen Treating Felcia Huebert/Extender: Frann Rider in Treatment: 4 Active Inactive ` Orientation to the Wound Care Program Nursing Diagnoses: Knowledge deficit related to the wound healing center program Goals: Patient/caregiver will verbalize understanding of the [facility] Program Date Initiated: 06/05/2016 Target Resolution Date: 08/29/2016 Goal Status: Active Interventions: Provide education on orientation to the wound center Notes: ` Wound/Skin Impairment Nursing Diagnoses: Knowledge deficit related to ulceration/compromised skin integrity Goals: Patient/caregiver will verbalize understanding of skin care regimen Date Initiated: 06/05/2016 Target Resolution Date: 08/29/2016 Goal Status: Active Ulcer/skin breakdown will have a volume  reduction of 30% by week 4 Date Initiated: 06/05/2016 Target Resolution Date: 08/29/2016 Goal Status: Active Ulcer/skin breakdown will have a volume reduction of 50% by week 8 Date Initiated: 06/05/2016 Target Resolution Date: 08/29/2016 Goal Status: Active Ulcer/skin breakdown will have a volume reduction of 80% by week 12 Date Initiated: 06/05/2016 Target Resolution Date: 08/29/2016 Goal Status: Active Ulcer/skin breakdown will heal within 14 weeks Date Initiated: 06/05/2016 Target Resolution Date: 08/29/2016 [name] [name], [name] [name] ([id number]) Goal Status: Active Interventions: Assess patient/caregiver ability to obtain necessary supplies Assess patient/caregiver ability to perform ulcer/skin care regimen upon admission and as needed Assess ulceration(s) every visit Notes: Electronic Signature(s) Signed: 07/03/2016 5:22:00 PM By: Montey Hora Entered By: Montey Hora on 07/03/2016 11:11:00 [name] [name] ([id number]) -------------------------------------------------------------------------------- Pain Assessment Details Patient Name: [name] [name]. Date of Service: 07/03/2016 11:00 AM Medical Record Number: [id number] Patient Account Number: [id number] Date of Birth/Sex: October 02, 1975 (41 y.o. Male) Treating RN: Montey Hora Primary Care Blaze Nylund: Kasandra Knudsen Other Clinician: Referring Azora Bonzo: Kasandra Knudsen Treating Nowell Sites/Extender: Frann Rider in Treatment: 4 Active Problems Location of Pain Severity and  Description of Pain Patient Has Paino Yes Site Locations Pain Location: Pain in Ulcers With Dressing Change: Yes Duration of the Pain. Constant / Intermittento Intermittent Character of Pain Describe the Pain: Other: sting Pain Management and Medication Current Pain Management: Notes Topical or injectable lidocaine is offered to patient for acute pain when surgical debridement is performed. If needed, Patient is instructed to use over the counter pain  medication for the following 24-48 hours after debridement. Wound care MDs do not prescribed pain medications. Patient has chronic pain or uncontrolled pain. Patient has been instructed to make an appointment with their Primary Care Physician for pain management. Electronic Signature(s) Signed: 07/03/2016 5:22:00 PM By: Montey Hora Entered By: Montey Hora on 07/03/2016 11:03:04 [name] [name] ([id number]) -------------------------------------------------------------------------------- Patient/Caregiver Education Details Patient Name: [name] [name]. Date of Service: 07/03/2016 11:00 AM Medical Record Number: [id number] Patient Account Number: [id number] Date of Birth/Gender: June 10, 1975 (41 y.o. Male) Treating RN: Montey Hora Primary Care Physician: Kasandra Knudsen Other Clinician: Referring Physician: Kasandra Knudsen Treating Physician/Extender: Frann Rider in Treatment: 4 Education Assessment Education Provided To: Patient Education Topics Provided Wound/Skin Impairment: Handouts: Other: wound care as ordered Methods: Demonstration, Explain/Verbal Responses: State content correctly Electronic Signature(s) Signed: 07/03/2016 5:22:00 PM By: Montey Hora Entered By: Montey Hora on 07/03/2016 11:19:51 [name] [name] ([id number]) -------------------------------------------------------------------------------- Wound Assessment Details Patient Name: [name] [name]. Date of Service: 07/03/2016 11:00 AM Medical Record Number: [id number] Patient Account Number: [id number] Date of Birth/Sex: 1976/04/01 (41 y.o. Male) Treating RN: Montey Hora Primary Care Berl Bonfanti: Kasandra Knudsen Other Clinician: Referring Lamberto Dinapoli: Kasandra Knudsen Treating Ivoree Felmlee/Extender: Frann Rider in Treatment: 4 Wound Status Wound Number: 1 Primary Etiology: Diabetic Wound/Ulcer of the Lower Extremity Wound Location: Left Lower Leg - Medial Secondary Venous Leg  Ulcer Wounding Event: Blister Etiology: Date Acquired: 06/03/2016 Wound Status: Open Weeks Of Treatment: 4 Comorbid Hypertension, Type II Diabetes Clustered Wound: No History: Photos Wound Measurements Length: (cm) 7.3 Width: (cm) 6.4 Depth: (cm) 0.2 Area: (cm) 36.694 Volume: (cm) 7.339 % Reduction in Area: 49.2% % Reduction in Volume: -1.6% Epithelialization: None Tunneling: No Undermining: No Wound Description Classification: Grade 1 Wound Margin: Flat and Intact Exudate Amount: Large Exudate Type: Serous Exudate Color: amber Foul Odor After Cleansing: No Slough/Fibrino No Wound Bed Granulation Amount: None Present (0%) Exposed Structure Necrotic Amount: Large (67-100%) Fascia Exposed: No Necrotic Quality: Eschar, Adherent Slough Fat Layer (Subcutaneous Tissue) Exposed: No Tendon Exposed: No Muscle Exposed: No [name] [name], [name] [name] ([id number]) Joint Exposed: No Bone Exposed: No Limited to Skin Breakdown Periwound Skin Texture Texture Color No Abnormalities Noted: No No Abnormalities Noted: No Callus: No Atrophie Blanche: No Crepitus: No Cyanosis: No Excoriation: No Ecchymosis: No Induration: No Erythema: Yes Rash: No Erythema Location: Circumferential Scarring: No Hemosiderin Staining: No Mottled: No Moisture Pallor: No No Abnormalities Noted: No Rubor: No Dry / Scaly: No Maceration: No Temperature / Pain Temperature: No Abnormality Tenderness on Palpation: Yes Wound Preparation Ulcer Cleansing: Rinsed/Irrigated with Saline Topical Anesthetic Applied: Other: lidocaine 4%, Treatment Notes Wound #1 (Left, Medial Lower Leg) 1. Cleansed with: Clean wound with Normal Saline 2. Anesthetic Topical Lidocaine 4% cream to wound bed prior to debridement 4. Dressing Applied: Santyl Ointment 5. Secondary Dressing Applied ABD Pad 7. Secured with Recruitment consultant) Signed: 07/03/2016 5:22:00 PM By: Montey Hora Entered By: Montey Hora  on 07/03/2016 11:10:32 [name] [name] ([id number]) -------------------------------------------------------------------------------- [place] Details Patient Name: [name] [name]. Date of Service: 07/03/2016 11:00 AM Medical Record Number: [id number] Patient Account Number: [id number]  Date of Birth/Sex: Apr 29, 1975 (41 y.o. Male) Treating RN: Montey Hora Primary Care Xanthe Couillard: Kasandra Knudsen Other Clinician: Referring Julianny Milstein: Kasandra Knudsen Treating Rajanae Mantia/Extender: Frann Rider in Treatment: 4 Vital Signs Time Taken: 11:05 Temperature (F): 98.1 Height (in): 73 Pulse (bpm): 81 Weight (lbs): 285 Respiratory Rate (breaths/min): 16 Body Mass Index (BMI): 37.6 Blood Pressure (mmHg): 125/75 Reference Range: 80 - 120 mg / dl Electronic Signature(s) Signed: 07/03/2016 5:22:00 PM By: Montey Hora Entered By: Montey Hora on 07/03/2016 11:05:53

## 2016-07-10 ENCOUNTER — Encounter: Payer: Medicare Other | Admitting: Surgery

## 2016-07-10 DIAGNOSIS — E11622 Type 2 diabetes mellitus with other skin ulcer: Secondary | ICD-10-CM | POA: Diagnosis not present

## 2016-07-11 NOTE — Progress Notes (Signed)
[name] [name], [name] [name] ([id number]) Visit Report for 07/10/2016 Arrival Information Details Patient Name: [name] [name], [name] [name]. Date of Service: 07/10/2016 12:30 PM Medical Record Number: [id number] Patient Account Number: [id number] Date of Birth/Sex: 12-23-1975 (41 y.o. Male) Treating RN: Montey Hora Primary Care Shaquela Weichert: Kasandra Knudsen Other Clinician: Referring Makyle Eslick: Kasandra Knudsen Treating Aaisha Sliter/Extender: Frann Rider in Treatment: 5 Visit Information History Since Last Visit Added or deleted any medications: No Patient Arrived: Ambulatory Any new allergies or adverse reactions: No Arrival Time: 12:38 Had a fall or experienced change in No Accompanied By: self activities of daily living that may affect Transfer Assistance: None risk of falls: Patient Identification Verified: Yes Signs or symptoms of abuse/neglect since last No Secondary Verification Process Yes visito Completed: Hospitalized since last visit: No Patient Has Alerts: Yes Has Dressing in Place as Prescribed: Yes Patient Alerts: Patient on Blood Pain Present Now: No Thinner DMII Electronic Signature(s) Signed: 07/10/2016 5:23:06 PM By: Montey Hora Entered By: Montey Hora on 07/10/2016 12:39:33 [name] [name] ([id number]) -------------------------------------------------------------------------------- Encounter Discharge Information Details Patient Name: [name] [name]. Date of Service: 07/10/2016 12:30 PM Medical Record Number: [id number] Patient Account Number: [id number] Date of Birth/Sex: [birth year] (41 y.o. Male) Treating RN: Montey Hora Primary Care Molli Gethers: Kasandra Knudsen Other Clinician: Referring Mithra Spano: Kasandra Knudsen Treating Keyoni Lapinski/Extender: Frann Rider in Treatment: 5 Encounter Discharge Information Items Discharge Pain Level: 0 Discharge Condition: Stable Ambulatory Status: Ambulatory Discharge Destination: Home Transportation: Private  Auto Accompanied By: self Schedule Follow-up Appointment: Yes Medication Reconciliation completed and provided to Patient/Care No Marena Witts: Provided on Clinical Summary of Care: 07/10/2016 Form Type Recipient Paper Patient LT Electronic Signature(s) Signed: 07/10/2016 1:35:08 PM By: Ruthine Dose Entered By: Ruthine Dose on 07/10/2016 13:35:08 [name] [name] ([id number]) -------------------------------------------------------------------------------- Lower Extremity Assessment Details Patient Name: [name] [name]. Date of Service: 07/10/2016 12:30 PM Medical Record Number: [id number] Patient Account Number: [id number] Date of Birth/Sex: 1975/12/10 (41 y.o. Male) Treating RN: Montey Hora Primary Care Latoi Giraldo: Kasandra Knudsen Other Clinician: Referring Zalyn Amend: Kasandra Knudsen Treating Yekaterina Escutia/Extender: Frann Rider in Treatment: 5 Vascular Assessment Pulses: Dorsalis Pedis Palpable: [Left:Yes] Posterior Tibial Extremity colors, hair growth, and conditions: Extremity Color: [Left:Hyperpigmented] Hair Growth on Extremity: [Left:No] Temperature of Extremity: [Left:Warm] Capillary Refill: [Left:< 3 seconds] Electronic Signature(s) Signed: 07/10/2016 5:23:06 PM By: Montey Hora Entered By: Montey Hora on 07/10/2016 13:05:18 [name] [name] ([id number]) -------------------------------------------------------------------------------- Multi Wound Chart Details Patient Name: [name] [name]. Date of Service: 07/10/2016 12:30 PM Medical Record Number: [id number] Patient Account Number: [id number] Date of Birth/Sex: Sep 21, 1975 (42 y.o. Male) Treating RN: Montey Hora Primary Care Vick Filter: Kasandra Knudsen Other Clinician: Referring Kathaleya Mcduffee: Kasandra Knudsen Treating Mehran Guderian/Extender: Frann Rider in Treatment: 5 Vital Signs Height(in): 73 Pulse(bpm): 68 Weight(lbs): 285 Blood Pressure 145/79 (mmHg): Body Mass Index(BMI): 38 Temperature(F):  98.4 Respiratory Rate 18 (breaths/min): Photos: [1:No Photos] [N/A:N/A] Wound Location: [1:Left Lower Leg - Medial] [N/A:N/A] Wounding Event: [1:Blister] [N/A:N/A] Primary Etiology: [1:Diabetic Wound/Ulcer of the Lower Extremity] [N/A:N/A] Secondary Etiology: [1:Venous Leg Ulcer] [N/A:N/A] Comorbid History: [1:Hypertension, Type II Diabetes] [N/A:N/A] Date Acquired: [1:06/03/2016] [N/A:N/A] Weeks of Treatment: [1:5] [N/A:N/A] Wound Status: [1:Open] [N/A:N/A] Measurements L x W x D 7x6x0.2 [N/A:N/A] (cm) Area (cm) : [1:32.987] [N/A:N/A] Volume (cm) : [1:6.597] [N/A:N/A] % Reduction in Area: [1:54.30%] [N/A:N/A] % Reduction in Volume: 8.70% [N/A:N/A] Classification: [1:Grade 1] [N/A:N/A] Exudate Amount: [1:Large] [N/A:N/A] Exudate Type: [1:Serous] [N/A:N/A] Exudate Color: [1:amber] [N/A:N/A] Wound Margin: [1:Flat and Intact] [N/A:N/A] Granulation Amount: [1:Small (1-33%)] [N/A:N/A] Granulation Quality: [1:Red] [N/A:N/A] Necrotic Amount: [1:Large (67-100%)] [N/A:N/A] Necrotic  Tissue: [1:Eschar, Adherent Slough] [N/A:N/A] Exposed Structures: [1:Fascia: No Fat Layer (Subcutaneous Tissue) Exposed: No Tendon: No] [N/A:N/A] Muscle: No Joint: No Bone: No Limited to Skin Breakdown Epithelialization: Small (1-33%) N/A N/A Periwound Skin Texture: Excoriation: No N/A N/A Induration: No Callus: No Crepitus: No Rash: No Scarring: No Periwound Skin Maceration: No N/A N/A Moisture: Dry/Scaly: No Periwound Skin Color: Erythema: Yes N/A N/A Atrophie Blanche: No Cyanosis: No Ecchymosis: No Hemosiderin Staining: No Mottled: No Pallor: No Rubor: No Erythema Location: Circumferential N/A N/A Temperature: No Abnormality N/A N/A Tenderness on Yes N/A N/A Palpation: Wound Preparation: Ulcer Cleansing: N/A N/A Rinsed/Irrigated with Saline Topical Anesthetic Applied: Other: lidocaine 4% Procedures Performed: Biopsy N/A N/A Treatment Notes Electronic Signature(s) Signed:  07/10/2016 1:19:53 PM By: Christin Fudge MD, FACS Entered By: Christin Fudge on 07/10/2016 13:19:52 [name] [name] ([id number]) -------------------------------------------------------------------------------- [place] Details Patient Name: [name] [name]. Date of Service: 07/10/2016 12:30 PM Medical Record Number: [id number] Patient Account Number: [id number] Date of Birth/Sex: Mar 02, 1976 (41 y.o. Male) Treating RN: Montey Hora Primary Care Brandye Inthavong: Kasandra Knudsen Other Clinician: Referring Ayrianna Mcginniss: Kasandra Knudsen Treating Mikai Meints/Extender: Frann Rider in Treatment: 5 Active Inactive ` Orientation to the Wound Care Program Nursing Diagnoses: Knowledge deficit related to the wound healing center program Goals: Patient/caregiver will verbalize understanding of the [place] Program Date Initiated: 06/05/2016 Target Resolution Date: 08/29/2016 Goal Status: Active Interventions: Provide education on orientation to the wound center Notes: ` Wound/Skin Impairment Nursing Diagnoses: Knowledge deficit related to ulceration/compromised skin integrity Goals: Patient/caregiver will verbalize understanding of skin care regimen Date Initiated: 06/05/2016 Target Resolution Date: 08/29/2016 Goal Status: Active Ulcer/skin breakdown will have a volume reduction of 30% by week 4 Date Initiated: 06/05/2016 Target Resolution Date: 08/29/2016 Goal Status: Active Ulcer/skin breakdown will have a volume reduction of 50% by week 8 Date Initiated: 06/05/2016 Target Resolution Date: 08/29/2016 Goal Status: Active Ulcer/skin breakdown will have a volume reduction of 80% by week 12 Date Initiated: 06/05/2016 Target Resolution Date: 08/29/2016 Goal Status: Active Ulcer/skin breakdown will heal within 14 weeks Date Initiated: 06/05/2016 Target Resolution Date: 08/29/2016 [name] [name], [name] [name] ([id number]) Goal Status: Active Interventions: Assess patient/caregiver ability to  obtain necessary supplies Assess patient/caregiver ability to perform ulcer/skin care regimen upon admission and as needed Assess ulceration(s) every visit Notes: Electronic Signature(s) Signed: 07/10/2016 5:23:06 PM By: Montey Hora Entered By: Montey Hora on 07/10/2016 13:05:24 [name] [name] ([id number]) -------------------------------------------------------------------------------- Pain Assessment Details Patient Name: [name] [name]. Date of Service: 07/10/2016 12:30 PM Medical Record Number: [id number] Patient Account Number: [id number] Date of Birth/Sex: 1975/12/21 (41 y.o. Male) Treating RN: Montey Hora Primary Care Kelita Wallis: Kasandra Knudsen Other Clinician: Referring Tobe Kervin: Kasandra Knudsen Treating Analayah Brooke/Extender: Frann Rider in Treatment: 5 Active Problems Location of Pain Severity and Description of Pain Patient Has Paino No Site Locations Pain Management and Medication Current Pain Management: Notes Topical or injectable lidocaine is offered to patient for acute pain when surgical debridement is performed. If needed, Patient is instructed to use over the counter pain medication for the following 24-48 hours after debridement. Wound care MDs do not prescribed pain medications. Patient has chronic pain or uncontrolled pain. Patient has been instructed to make an appointment with their Primary Care Physician for pain management. Electronic Signature(s) Signed: 07/10/2016 5:23:06 PM By: Montey Hora Entered By: Montey Hora on 07/10/2016 12:39:40 [name] [name] ([id number]) -------------------------------------------------------------------------------- Patient/Caregiver Education Details Patient Name: [name] [name]. Date of Service: 07/10/2016 12:30 PM Medical Record Number: [id number] Patient Account Number: [id number] Date  of Birth/Gender: [birth year] (41 y.o. Male) Treating RN: Montey Hora Primary Care Physician: Kasandra Knudsen Other Clinician: Referring Physician: Kasandra Knudsen Treating Physician/Extender: Frann Rider in Treatment: 5 Education Assessment Education Provided To: Patient Education Topics Provided Nutrition: Handouts: Nutrition Methods: Explain/Verbal Responses: State content correctly Electronic Signature(s) Signed: 07/10/2016 5:23:06 PM By: Montey Hora Entered By: Montey Hora on 07/10/2016 13:19:32 [name] [name] ([id number]) -------------------------------------------------------------------------------- Wound Assessment Details Patient Name: [name] [name]. Date of Service: 07/10/2016 12:30 PM Medical Record Number: [id number] Patient Account Number: [id number] Date of Birth/Sex: 1975/09/02 (41 y.o. Male) Treating RN: Montey Hora Primary Care Sorren Vallier: Kasandra Knudsen Other Clinician: Referring Suhaan Perleberg: Kasandra Knudsen Treating Kaycee Mcgaugh/Extender: Frann Rider in Treatment: 5 Wound Status Wound Number: 1 Primary Etiology: Diabetic Wound/Ulcer of the Lower Extremity Wound Location: Left Lower Leg - Medial Secondary Venous Leg Ulcer Wounding Event: Blister Etiology: Date Acquired: 06/03/2016 Wound Status: Open Weeks Of Treatment: 5 Comorbid Hypertension, Type II Diabetes Clustered Wound: No History: Photos Photo Uploaded By: Montey Hora on 07/10/2016 14:07:50 Wound Measurements Length: (cm) 7 Width: (cm) 6 Depth: (cm) 0.2 Area: (cm) 32.987 Volume: (cm) 6.597 % Reduction in Area: 54.3% % Reduction in Volume: 8.7% Epithelialization: Small (1-33%) Tunneling: No Undermining: No Wound Description Classification: Grade 1 Foul Odor Afte Wound Margin: Flat and Intact Slough/Fibrino Exudate Amount: Large Exudate Type: Serous Exudate Color: amber r Cleansing: No No Wound Bed Granulation Amount: Small (1-33%) Exposed Structure Granulation Quality: Red Fascia Exposed: No Necrotic Amount: Large (67-100%) Fat Layer (Subcutaneous  Tissue) Exposed: No Necrotic Quality: Eschar, Adherent Slough Tendon Exposed: No [name] [name], [name] [name] ([id number]) Muscle Exposed: No Joint Exposed: No Bone Exposed: No Limited to Skin Breakdown Periwound Skin Texture Texture Color No Abnormalities Noted: No No Abnormalities Noted: No Callus: No Atrophie Blanche: No Crepitus: No Cyanosis: No Excoriation: No Ecchymosis: No Induration: No Erythema: Yes Rash: No Erythema Location: Circumferential Scarring: No Hemosiderin Staining: No Mottled: No Moisture Pallor: No No Abnormalities Noted: No Rubor: No Dry / Scaly: No Maceration: No Temperature / Pain Temperature: No Abnormality Tenderness on Palpation: Yes Wound Preparation Ulcer Cleansing: Rinsed/Irrigated with Saline Topical Anesthetic Applied: Other: lidocaine 4%, Treatment Notes Wound #1 (Left, Medial Lower Leg) 1. Cleansed with: Clean wound with Normal Saline 2. Anesthetic Topical Lidocaine 4% cream to wound bed prior to debridement 4. Dressing Applied: Aquacel Ag 5. Secondary Dressing Applied ABD Pad 7. Secured with Recruitment consultant) Signed: 07/10/2016 5:23:06 PM By: Montey Hora Entered By: Montey Hora on 07/10/2016 12:48:10 [name] [name] ([id number]) -------------------------------------------------------------------------------- [place] Details Patient Name: [name] [name]. Date of Service: 07/10/2016 12:30 PM Medical Record Number: [id number] Patient Account Number: [id number] Date of Birth/Sex: 1975/08/16 (41 y.o. Male) Treating RN: Montey Hora Primary Care Dejai Schubach: Kasandra Knudsen Other Clinician: Referring Javarius Tsosie: Kasandra Knudsen Treating Omaya Nieland/Extender: Frann Rider in Treatment: 5 Vital Signs Time Taken: 12:39 Temperature (F): 98.4 Height (in): 73 Pulse (bpm): 68 Weight (lbs): 285 Respiratory Rate (breaths/min): 18 Body Mass Index (BMI): 37.6 Blood Pressure (mmHg): 145/79 Reference Range: 80 - 120 mg  / dl Electronic Signature(s) Signed: 07/10/2016 5:23:06 PM By: Montey Hora Entered By: Montey Hora on 07/10/2016 12:42:11

## 2016-07-11 NOTE — Progress Notes (Signed)
STEPHAN, NELIS ([id number]) Visit Report for 07/10/2016 Biopsy Details Patient Name: [name] [name], [name] [name]. Date of Service: 07/10/2016 12:30 PM Medical Record Number: [id number] Patient Account Number: [id number] Date of Birth/Sex: 01-16-1976 (41 y.o. Male) Treating RN: Montey Hora Primary Care Provider: Kasandra Knudsen Other Clinician: Referring Provider: Kasandra Knudsen Treating Provider/Extender: Frann Rider in Treatment: 5 Biopsy Performed for: Wound #1 Left, Medial Lower Leg Location(s): Wound Margin Performed By: Physician Christin Fudge, MD Tissue Punch: No Number of Specimens Taken: 2 Specimen Sent To Pathology: Yes Pre-procedure Verification/Time-Out Yes - 13:11 Taken: Pain Control: Lidocaine Injectable Lidocaine Percent: 2% Instrument: Blade, Forceps Bleeding: Moderate Hemostasis Achieved: Silver Nitrate Procedural Pain: 0 Post Procedural Pain: 0 Response to Treatment: Procedure was tolerated well Post Procedure Diagnosis Same as Pre-procedure Electronic Signature(s) Signed: 07/10/2016 1:20:13 PM By: Christin Fudge MD, FACS Entered By: Christin Fudge on 07/10/2016 13:20:13 [name] [name] ([id number]) -------------------------------------------------------------------------------- Chief Complaint Document Details Patient Name: [name] [name]. Date of Service: 07/10/2016 12:30 PM Medical Record Number: [id number] Patient Account Number: [id number] Date of Birth/Sex: November 24, 1975 (41 y.o. Male) Treating RN: Montey Hora Primary Care Provider: Kasandra Knudsen Other Clinician: Referring Provider: Kasandra Knudsen Treating Provider/Extender: Frann Rider in Treatment: 5 Information Obtained from: Patient Chief Complaint Patients presents for treatment of an open diabetic ulcer the left lower extremity for 5 days Electronic Signature(s) Signed: 07/10/2016 1:20:19 PM By: Christin Fudge MD, FACS Entered By: Christin Fudge on 07/10/2016 13:20:19 [name] [name] ([id number]) -------------------------------------------------------------------------------- HPI Details Patient Name: [name] [name]. Date of Service: 07/10/2016 12:30 PM Medical Record Number: [id number] Patient Account Number: [id number] Date of Birth/Sex: 1975-12-07 (41 y.o. Male) Treating RN: Montey Hora Primary Care Provider: Kasandra Knudsen Other Clinician: Referring Provider: Kasandra Knudsen Treating Provider/Extender: Frann Rider in Treatment: 5 History of Present Illness Location: left lower extremity Quality: Patient reports experiencing a dull pain to affected area(s). Severity: Patient states wound (s) are getting better. Duration: Patient has had the wound for < 2 weeks prior to presenting for treatment Timing: Pain in wound is Intermittent (comes and goes Context: The wound appeared gradually over time Modifying Factors: Other treatment(s) tried include:local care and oral antibiotic Associated Signs and Symptoms: Patient reports having increase swelling. HPI Description: 41 year old gentleman who has noticed a blister on his left lower extremity which came up 5 days ago and it has drained fluid. He was seen by hispractitioner who reviewed him and put him on doxycycline now asked him to come to the wound center. His past medical history is significant for diabetes mellitus type 2, congestive heart failure, renal failure, hypertension, and he is receiving hemodialysis 3 times a week. He has never been a smoker.Marland Kitchen 06/12/2016 -- the patient has in continue with hemodialysis and no changes have occurred in the last week. 07/03/2016 -- the patient has been doing his dressings only every other day and has not been washing was scrubbing the wound as advised 07/10/2016 -- the patient did not schedule his venous duplex study for reflux. He is agreeable about getting a wound biopsy done today Electronic Signature(s) Signed: 07/10/2016 1:21:01 PM By: Christin Fudge MD, FACS Entered By: Christin Fudge on 07/10/2016 13:21:01 [name] [name] ([id number]) -------------------------------------------------------------------------------- Physical Exam Details Patient Name: [name] [name]. Date of Service: 07/10/2016 12:30 PM Medical Record Number: [id number] Patient Account Number: [id number] Date of Birth/Sex: 1975-10-22 (41 y.o. Male) Treating RN: Montey Hora Primary Care Provider: Kasandra Knudsen Other Clinician: Referring Provider: Kasandra Knudsen Treating Provider/Extender: Frann Rider in Treatment:  5 Constitutional . Pulse regular. Respirations normal and unlabored. Afebrile. . Eyes Nonicteric. Reactive to light. Ears, Nose, Mouth, and Throat Lips, teeth, and gums WNL.Marland Kitchen Moist mucosa without lesions. Neck supple and nontender. No palpable supraclavicular or cervical adenopathy. Normal sized without goiter. Respiratory WNL. No retractions.. Breath sounds WNL, No rubs, rales, rhonchi, or wheeze.. Cardiovascular Heart rhythm and rate regular, no murmur or gallop.. Pedal Pulses WNL. No clubbing, cyanosis or edema. Chest Breasts symmetical and no nipple discharge.. Breast tissue WNL, no masses, lumps, or tenderness.. Lymphatic No adneopathy. No adenopathy. No adenopathy. Musculoskeletal Adexa without tenderness or enlargement.. Digits and nails w/o clubbing, cyanosis, infection, petechiae, ischemia, or inflammatory conditions.. Integumentary (Hair, Skin) No suspicious lesions. No crepitus or fluctuance. No peri-wound warmth or erythema. No masses.Marland Kitchen Psychiatric Judgement and insight Intact.. No evidence of depression, anxiety, or agitation.. Notes wound continues to have a lot of subcutaneous debris and I have taken wound edge biopsies from 2 separate areas under local anesthesia with the normal precautions. 15 number blade was used. Electronic Signature(s) Signed: 07/10/2016 1:21:37 PM By: Christin Fudge MD, FACS Entered By:  Christin Fudge on 07/10/2016 13:21:36 [name] [name] ([id number]) -------------------------------------------------------------------------------- Physician Orders Details Patient Name: [name] [name]. Date of Service: 07/10/2016 12:30 PM Medical Record Number: [id number] Patient Account Number: [id number] Date of Birth/Sex: March 07, 1976 (41 y.o. Male) Treating RN: Montey Hora Primary Care Provider: Kasandra Knudsen Other Clinician: Referring Provider: Kasandra Knudsen Treating Provider/Extender: Frann Rider in Treatment: 5 Verbal / Phone Orders: No Diagnosis Coding Wound Cleansing Wound #1 Left,Medial Lower Leg o Clean wound with Normal Saline. o Cleanse wound with mild soap and water o May Shower, gently pat wound dry prior to applying new dressing. Anesthetic Wound #1 Left,Medial Lower Leg o Topical Lidocaine 4% cream applied to wound bed prior to debridement Primary Wound Dressing Wound #1 Left,Medial Lower Leg o Santyl Ointment o Aquacel Ag - in clinic today only Secondary Dressing Wound #1 Left,Medial Lower Leg o ABD pad o Other - tape Dressing Change Frequency Wound #1 Left,Medial Lower Leg o Change dressing every day. Follow-up Appointments Wound #1 Left,Medial Lower Leg o Return Appointment in 1 week. o Nurse Visit as needed Edema Control Wound #1 Left,Medial Lower Leg o Elevate legs to the level of the heart and pump ankles as often as possible Medications-please add to medication list. Wound #1 Left,Medial Lower Leg [name] [name], [name] [name] ([id number]) o Santyl Enzymatic Ointment Laboratory o Tissue Pathology biopsy report (PATH) oooo LOINC Code: [id number] oooo Convenience Name: [facility] Path Bx report Electronic Signature(s) Signed: 07/10/2016 4:33:40 PM By: Christin Fudge MD, FACS Signed: 07/10/2016 5:23:06 PM By: Montey Hora Entered By: Montey Hora on 07/10/2016 13:34:01 [name] [name]  ([id number]) -------------------------------------------------------------------------------- Problem List Details Patient Name: [name] [name]. Date of Service: 07/10/2016 12:30 PM Medical Record Number: [id number] Patient Account Number: [id number] Date of Birth/Sex: 03-Jul-1975 (41 y.o. Male) Treating RN: Montey Hora Primary Care Provider: Kasandra Knudsen Other Clinician: Referring Provider: Kasandra Knudsen Treating Provider/Extender: Frann Rider in Treatment: 5 Active Problems ICD-10 Encounter Code Description Active Date Diagnosis E11.622 Type 2 diabetes mellitus with other skin ulcer 06/05/2016 Yes Z99.2 Dependence on renal dialysis 06/05/2016 Yes L97.222 Non-pressure chronic ulcer of left calf with fat layer 07/03/2016 Yes exposed Inactive Problems Resolved Problems Electronic Signature(s) Signed: 07/10/2016 1:19:47 PM By: Christin Fudge MD, FACS Entered By: Christin Fudge on 07/10/2016 13:19:47 [name] [name] ([id number]) -------------------------------------------------------------------------------- Progress Note Details Patient Name: [name] [name]. Date of Service: 07/10/2016 12:30 PM Medical Record  Number: [id number] Patient Account Number: [id number] Date of Birth/Sex: 1975-08-30 (41 y.o. Male) Treating RN: Montey Hora Primary Care Provider: Kasandra Knudsen Other Clinician: Referring Provider: Kasandra Knudsen Treating Provider/Extender: Frann Rider in Treatment: 5 Subjective Chief Complaint Information obtained from Patient Patients presents for treatment of an open diabetic ulcer the left lower extremity for 5 days History of Present Illness (HPI) The following HPI elements were documented for the patient's wound: Location: left lower extremity Quality: Patient reports experiencing a dull pain to affected area(s). Severity: Patient states wound (s) are getting better. Duration: Patient has had the wound for < 2 weeks prior to presenting for  treatment Timing: Pain in wound is Intermittent (comes and goes Context: The wound appeared gradually over time Modifying Factors: Other treatment(s) tried include:local care and oral antibiotic Associated Signs and Symptoms: Patient reports having increase swelling. 41 year old gentleman who has noticed a blister on his left lower extremity which came up 5 days ago and it has drained fluid. He was seen by hispractitioner who reviewed him and put him on doxycycline now asked him to come to the wound center. His past medical history is significant for diabetes mellitus type 2, congestive heart failure, renal failure, hypertension, and he is receiving hemodialysis 3 times a week. He has never been a smoker.Marland Kitchen 06/12/2016 -- the patient has in continue with hemodialysis and no changes have occurred in the last week. 07/03/2016 -- the patient has been doing his dressings only every other day and has not been washing was scrubbing the wound as advised 07/10/2016 -- the patient did not schedule his venous duplex study for reflux. He is agreeable about getting a wound biopsy done today Objective Constitutional Pulse regular. Respirations normal and unlabored. Afebrile. Vitals Time Taken: 12:39 PM, Height: 73 in, Weight: 285 lbs, BMI: 37.6, Temperature: 98.4 F, Pulse: 68 [name] [name], [name] D. ([id number]) bpm, Respiratory Rate: 18 breaths/min, Blood Pressure: 145/79 mmHg. Eyes Nonicteric. Reactive to light. Ears, Nose, Mouth, and Throat Lips, teeth, and gums WNL.Marland Kitchen Moist mucosa without lesions. Neck supple and nontender. No palpable supraclavicular or cervical adenopathy. Normal sized without goiter. Respiratory WNL. No retractions.. Breath sounds WNL, No rubs, rales, rhonchi, or wheeze.. Cardiovascular Heart rhythm and rate regular, no murmur or gallop.. Pedal Pulses WNL. No clubbing, cyanosis or edema. Chest Breasts symmetical and no nipple discharge.. Breast tissue WNL, no masses, lumps, or  tenderness.. Lymphatic No adneopathy. No adenopathy. No adenopathy. Musculoskeletal Adexa without tenderness or enlargement.. Digits and nails w/o clubbing, cyanosis, infection, petechiae, ischemia, or inflammatory conditions.Marland Kitchen Psychiatric Judgement and insight Intact.. No evidence of depression, anxiety, or agitation.. General Notes: wound continues to have a lot of subcutaneous debris and I have taken wound edge biopsies from 2 separate areas under local anesthesia with the normal precautions. 15 number blade was used. Integumentary (Hair, Skin) No suspicious lesions. No crepitus or fluctuance. No peri-wound warmth or erythema. No masses.. Wound #1 status is Open. Original cause of wound was Blister. The wound is located on the Left,Medial Lower Leg. The wound measures 7cm length x 6cm width x 0.2cm depth; 32.987cm^2 area and 6.597cm^3 volume. The wound is limited to skin breakdown. There is no tunneling or undermining noted. There is a large amount of serous drainage noted. The wound margin is flat and intact. There is small (1-33%) red granulation within the wound bed. There is a large (67-100%) amount of necrotic tissue within the wound bed including Eschar and Adherent Slough. The periwound skin appearance exhibited: Erythema. The periwound skin  appearance did not exhibit: Callus, Crepitus, Excoriation, Induration, Rash, Scarring, Dry/Scaly, Maceration, Atrophie Blanche, Cyanosis, Ecchymosis, Hemosiderin Staining, Mottled, Pallor, Rubor. The surrounding wound skin color is noted with erythema which is circumferential. Periwound temperature was noted as No Abnormality. The periwound has tenderness on palpation. [name] [name], [name] [name] ([id number]) Assessment Active Problems ICD-10 E11.622 - Type 2 diabetes mellitus with other skin ulcer Z99.2 - Dependence on renal dialysis L97.222 - Non-pressure chronic ulcer of left calf with fat layer exposed Procedures Wound #1 Wound #1 is a Diabetic  Wound/Ulcer of the Lower Extremity located on the Left, Medial Lower Leg . There was a biopsy performed by Christin Fudge, MD. There was a biopsy performed on Wound Margin. The skin was cleansed and prepped with anti-septic followed by pain control using Lidocaine Injectable: 2%. Tissue was removed at its base with the following instrument(s): Blade and Forceps and sent to pathology. A Moderate amount of bleeding was controlled with Silver Nitrate. A time out was conducted at 13:11, prior to the start of the procedure. The procedure was tolerated well with a pain level of 0 throughout and a pain level of 0 following the procedure. Post procedure Diagnosis Wound #1: Same as Pre-Procedure Plan Wound Cleansing: Wound #1 Left,Medial Lower Leg: Clean wound with Normal Saline. Cleanse wound with mild soap and water May Shower, gently pat wound dry prior to applying new dressing. Anesthetic: Wound #1 Left,Medial Lower Leg: Topical Lidocaine 4% cream applied to wound bed prior to debridement Primary Wound Dressing: Wound #1 Left,Medial Lower Leg: Santyl Ointment Aquacel Ag - in clinic today only Secondary Dressing: Wound #1 Left,Medial Lower Leg: [name] [name], [name] [name] ([id number]) ABD pad Other - tape Dressing Change Frequency: Wound #1 Left,Medial Lower Leg: Change dressing every day. Follow-up Appointments: Wound #1 Left,Medial Lower Leg: Return Appointment in 1 week. Nurse Visit as needed Edema Control: Wound #1 Left,Medial Lower Leg: Elevate legs to the level of the heart and pump ankles as often as possible Medications-please add to medication list.: Wound #1 Left,Medial Lower Leg: Santyl Enzymatic Ointment Laboratory ordered were: [facility] Bx report I would like to look for venous causes and also do a biopsy of the wound edges to rule out calciphylaxis. I have recommended: 1. Santyl ointment and a light Kerlix and Coban wrap, to be changed daily. 2. Elevation and exercise 3.  Good control of his diabetes mellitus 4. venous duplex reflux study of both lower extremities -- appointment still pending 5. I have done a biopsy of skin edges under local anesthesia -- sent for pathology 6. Review next week in the wound center Electronic Signature(s) Signed: 07/10/2016 4:35:08 PM By: Christin Fudge MD, FACS Previous Signature: 07/10/2016 1:23:44 PM Version By: Christin Fudge MD, FACS Entered By: Christin Fudge on 07/10/2016 16:35:07 [name] [name] ([id number]) -------------------------------------------------------------------------------- SuperBill Details Patient Name: [name] [name]. Date of Service: 07/10/2016 Medical Record Number: [id number] Patient Account Number: [id number] Date of Birth/Sex: December 28, 1975 (41 y.o. 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 Facility Procedures CPT4 Code: [id number] Description: 11101 - BIOPSY SKIN GREATER THAN ONE ICD-10 Description Diagnosis E11.622 Type 2 diabetes mellitus with other skin ulcer Z99.2 Dependence on renal dialysis L97.222 Non-pressure chronic ulcer of left calf with  fat la Modifier: yer exposed Quantity: 1 Physician Procedures CPT4 Code: [id number] Description: 75300 - WC PHYS BX-SKIN MULTI AREAS ICD-10 Description Diagnosis E11.622 Type 2 diabetes mellitus with other skin ulcer Z99.2 Dependence on renal dialysis L97.222 Non-pressure chronic ulcer of left calf with fat l Modifier: ayer exposed Quantity: 1 Electronic Signature(s) Signed: 07/10/2016 4:35:18 PM By: Christin Fudge MD, FACS Previous Signature: 07/10/2016 1:24:27 PM Version By:  Christin Fudge MD, FACS Previous Signature: 07/10/2016 1:24:09 PM Version By: Christin Fudge MD, FACS Entered By: Christin Fudge on 07/10/2016 16:35:17

## 2016-07-14 LAB — SURGICAL PATHOLOGY

## 2016-07-17 ENCOUNTER — Ambulatory Visit: Payer: Medicare Other | Admitting: Surgery

## 2016-07-18 ENCOUNTER — Other Ambulatory Visit: Payer: Self-pay | Admitting: Surgery

## 2016-07-18 DIAGNOSIS — R609 Edema, unspecified: Secondary | ICD-10-CM

## 2016-07-22 ENCOUNTER — Ambulatory Visit (INDEPENDENT_AMBULATORY_CARE_PROVIDER_SITE_OTHER): Payer: Medicare Other | Admitting: Vascular Surgery

## 2016-07-22 ENCOUNTER — Ambulatory Visit (INDEPENDENT_AMBULATORY_CARE_PROVIDER_SITE_OTHER): Payer: Medicare Other

## 2016-07-22 ENCOUNTER — Encounter (INDEPENDENT_AMBULATORY_CARE_PROVIDER_SITE_OTHER): Payer: Self-pay | Admitting: Vascular Surgery

## 2016-07-22 VITALS — BP 142/83 | HR 87 | Resp 18 | Wt 292.0 lb

## 2016-07-22 DIAGNOSIS — I10 Essential (primary) hypertension: Secondary | ICD-10-CM

## 2016-07-22 DIAGNOSIS — E1122 Type 2 diabetes mellitus with diabetic chronic kidney disease: Secondary | ICD-10-CM | POA: Diagnosis not present

## 2016-07-22 DIAGNOSIS — Z794 Long term (current) use of insulin: Secondary | ICD-10-CM

## 2016-07-22 DIAGNOSIS — I83029 Varicose veins of left lower extremity with ulcer of unspecified site: Secondary | ICD-10-CM

## 2016-07-22 DIAGNOSIS — Z992 Dependence on renal dialysis: Secondary | ICD-10-CM

## 2016-07-22 DIAGNOSIS — N186 End stage renal disease: Secondary | ICD-10-CM

## 2016-07-22 DIAGNOSIS — R609 Edema, unspecified: Secondary | ICD-10-CM | POA: Diagnosis not present

## 2016-07-22 DIAGNOSIS — L97929 Non-pressure chronic ulcer of unspecified part of left lower leg with unspecified severity: Principal | ICD-10-CM

## 2016-07-22 NOTE — Progress Notes (Addendum)
Patient ID: [name] [name], male   DOB: 24-Jan-1976, 41 y.o.   MRN: [id number]  Chief Complaint  Patient presents with  . Follow-up    HPI [name] [name] is a 41 y.o. male.  I am asked to see the patient by Dr. Con Memos of the [facility] for evaluation of non-healing wounds on the left leg and swelling bilaterally.  The patient reports several weeks to months of wounds on the left medial calf and ankle area.  He has swelling bilaterally.  He has renal failure and other medical issues as below.  Little pain from the wound.  No fever or chills.   Venous reflux study is done for evaluation of his venous system.  This demonstrates GSV reflux bilaterally and deep venous reflux on the left.  No DVT or SVT.   Past Medical History:  Diagnosis Date  . CHF (congestive heart failure) ([place])   . Diabetes mellitus without complication ([place])   . Hypertension   . Renal disorder   . Shortness of breath dyspnea     Past Surgical History:  Procedure Laterality Date  . PERIPHERAL VASCULAR CATHETERIZATION N/A 05/07/2015   Procedure: A/V Shuntogram/Fistulagram;  Surgeon: Katha Cabal, MD;  Location: [facility];  Service: Cardiovascular;  Laterality: N/A;  . PERIPHERAL VASCULAR CATHETERIZATION Left 05/07/2015   Procedure: A/V Shunt Intervention;  Surgeon: Katha Cabal, MD;  Location: [facility];  Service: Cardiovascular;  Laterality: Left;  . TEE WITHOUT CARDIOVERSION N/A 07/05/2015   Procedure: TRANSESOPHAGEAL ECHOCARDIOGRAM (TEE);  Surgeon: Dionisio David, MD;  Location: [facility];  Service: Cardiovascular;  Laterality: N/A;    Family History No bleeding disorders, clotting disorders, autoimmune diseases, or aneurysms  Social History Social History  Substance Use Topics  . Smoking status: Never Smoker  . Smokeless tobacco: Never Used  . Alcohol use No  No IVDU  Allergies  Allergen Reactions  . Lisinopril Other (See Comments)    Current Outpatient  Prescriptions  Medication Sig Dispense Refill  . amLODipine (NORVASC) 10 MG tablet Take 1 tablet by mouth daily.    . carvedilol (COREG) 25 MG tablet Take 1 tablet by mouth 2 (two) times daily.    Marland Kitchen FOSRENOL 1000 MG chewable tablet Chew 2 tablets by mouth 4 (four) times daily. With 3 meals and 1 snack.  6  . furosemide (LASIX) 40 MG tablet Take 1 tablet by mouth 2 (two) times daily.    Marland Kitchen HUMULIN 70/30 (70-30) 100 UNIT/ML injection Inject 10-30 Units into the skin 2 (two) times daily with a meal. 10 units before breakfast and 30 units before super    . hydrALAZINE (APRESOLINE) 50 MG tablet Take 1 tablet by mouth 3 (three) times daily.    Marland Kitchen losartan (COZAAR) 100 MG tablet Take 1 tablet by mouth daily.    . SENSIPAR 60 MG tablet Take 1 tablet by mouth daily.  3  . warfarin (COUMADIN) 10 MG tablet Take 10 mg by mouth daily.    Marland Kitchen warfarin (COUMADIN) 4 MG tablet Take 1 tablet by mouth daily. Reported on 06/28/2015     No current facility-administered medications for this visit.       REVIEW OF SYSTEMS (Negative unless checked)  Constitutional: [] Weight loss  [] Fever  [] Chills Cardiac: [] Chest pain   [] Chest pressure   [] Palpitations   [] Shortness of breath when laying flat   [] Shortness of breath at rest   [x] Shortness of breath with exertion. Vascular:  []   Pain in legs with walking   [] Pain in legs at rest   [] Pain in legs when laying flat   [] Claudication   [] Pain in feet when walking  [] Pain in feet at rest  [] Pain in feet when laying flat   [] History of DVT   [] Phlebitis   [x] Swelling in legs   [] Varicose veins   [x] Non-healing ulcers Pulmonary:   [] Uses home oxygen   [] Productive cough   [] Hemoptysis   [] Wheeze  [] COPD   [] Asthma Neurologic:  [] Dizziness  [] Blackouts   [] Seizures   [] History of stroke   [] History of TIA  [] Aphasia   [] Temporary blindness   [] Dysphagia   [] Weakness or numbness in arms   [] Weakness or numbness in legs Musculoskeletal:  [] Arthritis   [] Joint swelling   [] Joint pain    [] Low back pain Hematologic:  [] Easy bruising  [] Easy bleeding   [] Hypercoagulable state   [] Anemic  [] Hepatitis Gastrointestinal:  [] Blood in stool   [] Vomiting blood  [] Gastroesophageal reflux/heartburn   [] Abdominal pain Genitourinary:  [x] Chronic kidney disease   [] Difficult urination  [] Frequent urination  [] Burning with urination   [] Hematuria Skin:  [] Rashes   [x] Ulcers   [x] Wounds Psychological:  [] History of anxiety   []  History of major depression.    Physical Exam BP (!) 142/83   Pulse 87   Resp 18   Wt 132.5 kg (292 lb)   BMI 38.52 kg/m  Gen:  WD/WN, NAD Head: [place]/AT, No temporalis wasting. Ear/Nose/Throat: Hearing grossly intact, nares w/o erythema or drainage, oropharynx w/o Erythema/Exudate Eyes: Conjunctiva clear, sclera non-icteric  Neck: trachea midline.  No JVD.  Pulmonary:  Good air movement, respirations not labored, no use of accessory muscles Cardiac: RRR Vascular:  Vessel Right Left  Radial Palpable Palpable  Ulnar Palpable Palpable  Brachial Palpable Palpable  Carotid Palpable, without bruit Palpable, without bruit  Aorta Not palpable N/A  Femoral Palpable Palpable  Popliteal Palpable Palpable  PT Not Palpable Not Palpable  DP Palpable 1+ Palpable   Gastrointestinal: soft, non-tender/non-distended. No guarding/reflex. No masses, surgical incisions, or scars. Musculoskeletal: M/S 5/5 throughout.  Extremities without ischemic changes.  No deformity or atrophy. 1-2+ RLE edema, 2+ LLE edema. Neurologic: Sensation grossly intact in extremities.  Symmetrical.  Speech is fluent. Motor exam as listed above. Psychiatric: Judgment intact, Mood & affect appropriate for pt's clinical situation. Dermatologic: open wound on left medial lower leg dressed today  Radiology No results found.  Labs Recent Results (from the past 2160 hour(s))  Surgical pathology     Status: None   Collection Time: 07/10/16 12:00 AM  Result Value Ref Range   SURGICAL PATHOLOGY        Surgical Pathology CASE: [id number] PATIENT: Jannette Spanner Surgical Pathology Report     SPECIMEN SUBMITTED: A. Wound, left leg, lower medial; skin excision  CLINICAL HISTORY: None provided  PRE-OPERATIVE DIAGNOSIS: ESRD on HD worsening ulcer on LLE  POST-OPERATIVE DIAGNOSIS: None provided.     DIAGNOSIS: A. 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 - NEGATIVE FOR MALIGNANCY.   GROSS DESCRIPTION: A. Labeled: Left medial lower leg  Tissue fragment(s): 2  Size: 0.4 x 0.3 x 0.4 cm and 0.5 x 0.4 x 0.4 cm  Description: 2 roughly triangular shaped pieces of tan-brown skin and soft tissue, probable resected margins inked  Entirely submitted in one cassette(s).     Final Diagnosis performed by Quay Burow, MD.  Electronically signed 07/14/2016 8:55:36AM  The electronic signature indicates that the named Attending Pathologist has evaluated the specimen  Tech nical component performed at [facility], [address] [place], [place] [address] Lab: [phone] Dir: Darrick Penna. Evette Doffing, MD  Professional component performed at [facility], [facility], [place], [place], [place] [address] Lab: [phone] Dir: Dellia Nims. Rubinas, MD      Assessment/Plan:  ESRD on dialysis [facility]) Renal failure likely worsens his volume status and swelling.  Gets HD and access working well.    Diabetes ([place]) blood glucose control important in reducing the progression of atherosclerotic disease. Also, involved in wound healing. On appropriate medications.   Essential hypertension blood pressure control important in reducing the progression of atherosclerotic disease. On appropriate oral medications.   Venous ulcer of left leg (HCC) Venous reflux study is done for evaluation of his venous system.  This demonstrates GSV reflux bilaterally and deep venous reflux on the left.   No DVT or SVT. Given these findings, patient wound benefit from EVLT of the left GSV.  He still has deep venous reflux and will need compression stockings, elevation, increased activity and possibly a lymphedema pump in the future as well.  Risks and benefits of the procedure discussed.  Patient considering his options at this time.      [name] 07/24/2016, 5:14 PM   This note was created with Dragon medical transcription system.  Any errors from dictation are unintentional.

## 2016-07-24 ENCOUNTER — Encounter: Payer: Medicare Other | Admitting: Surgery

## 2016-07-24 DIAGNOSIS — Z992 Dependence on renal dialysis: Secondary | ICD-10-CM

## 2016-07-24 DIAGNOSIS — L97929 Non-pressure chronic ulcer of unspecified part of left lower leg with unspecified severity: Principal | ICD-10-CM

## 2016-07-24 DIAGNOSIS — N186 End stage renal disease: Secondary | ICD-10-CM | POA: Insufficient documentation

## 2016-07-24 DIAGNOSIS — I83029 Varicose veins of left lower extremity with ulcer of unspecified site: Secondary | ICD-10-CM | POA: Insufficient documentation

## 2016-07-24 DIAGNOSIS — E119 Type 2 diabetes mellitus without complications: Secondary | ICD-10-CM | POA: Insufficient documentation

## 2016-07-24 DIAGNOSIS — E11622 Type 2 diabetes mellitus with other skin ulcer: Secondary | ICD-10-CM | POA: Diagnosis not present

## 2016-07-24 DIAGNOSIS — I10 Essential (primary) hypertension: Secondary | ICD-10-CM | POA: Insufficient documentation

## 2016-07-24 NOTE — Assessment & Plan Note (Signed)
blood pressure control important in reducing the progression of atherosclerotic disease. On appropriate oral medications.  

## 2016-07-24 NOTE — Patient Instructions (Signed)

## 2016-07-24 NOTE — Assessment & Plan Note (Signed)
blood glucose control important in reducing the progression of atherosclerotic disease. Also, involved in wound healing. On appropriate medications.  

## 2016-07-24 NOTE — Assessment & Plan Note (Signed)
Renal failure likely worsens his volume status and swelling.  Gets HD and access working well.

## 2016-07-24 NOTE — Assessment & Plan Note (Signed)
Venous reflux study is done for evaluation of his venous system.  This demonstrates GSV reflux bilaterally and deep venous reflux on the left.  No DVT or SVT. Given these findings, patient wound benefit from EVLT of the left GSV.  He still has deep venous reflux and will need compression stockings, elevation, increased activity and possibly a lymphedema pump in the future as well.  Risks and benefits of the procedure discussed.  Patient considering his options at this time.

## 2016-07-25 NOTE — Progress Notes (Signed)
ROSA, GAMBALE ([id number]) Visit Report for 07/24/2016 Arrival Information Details Patient Name: [name] [name], [name] [name]. Date of Service: 07/24/2016 10:15 AM Medical Record Number: [id number] Patient Account Number: [id number] Date of Birth/Sex: November 21, 1975 (41 y.o. Male) Treating RN: Montey Hora Primary Care Fusae Florio: Kasandra Knudsen Other Clinician: Referring Stephanny Tsutsui: Kasandra Knudsen Treating Kerissa Coia/Extender: Frann Rider in Treatment: 7 Visit Information History Since Last Visit Added or deleted any medications: No Patient Arrived: Ambulatory Any new allergies or adverse reactions: No Arrival Time: 10:24 Had a fall or experienced change in No Accompanied By: self activities of daily living that may affect Transfer Assistance: None risk of falls: Patient Identification Verified: Yes Signs or symptoms of abuse/neglect since last No Secondary Verification Process Yes visito Completed: Hospitalized since last visit: No Patient Has Alerts: Yes Has Dressing in Place as Prescribed: Yes Patient Alerts: Patient on Blood Pain Present Now: No Thinner DMII Electronic Signature(s) Signed: 07/24/2016 5:13:15 PM By: Montey Hora Entered By: Montey Hora on 07/24/2016 10:24:40 [name] [name] ([id number]) -------------------------------------------------------------------------------- Encounter Discharge Information Details Patient Name: [name] [name]. Date of Service: 07/24/2016 10:15 AM Medical Record Number: [id number] Patient Account Number: [id number] Date of Birth/Sex: January 28, 1976 (41 y.o. Male) Treating RN: Montey Hora Primary Care Bennie Scaff: Kasandra Knudsen Other Clinician: Referring Rheana Casebolt: Kasandra Knudsen Treating Lavinia Mcneely/Extender: Frann Rider in Treatment: 7 Encounter Discharge Information Items Discharge Pain Level: 0 Discharge Condition: Stable Ambulatory Status: Ambulatory Discharge Destination: Home Transportation: Private  Auto Accompanied By: self Schedule Follow-up Appointment: Yes Medication Reconciliation completed and provided to Patient/Care No Ashleigh Arya: Provided on Clinical Summary of Care: 07/24/2016 Form Type Recipient Paper Patient LT Electronic Signature(s) Signed: 07/24/2016 11:24:20 AM By: Ruthine Dose Previous Signature: 07/24/2016 10:39:48 AM Version By: Montey Hora Entered By: Ruthine Dose on 07/24/2016 11:24:20 [name] [name] ([id number]) -------------------------------------------------------------------------------- Lower Extremity Assessment Details Patient Name: [name] [name]. Date of Service: 07/24/2016 10:15 AM Medical Record Number: [id number] Patient Account Number: [id number] Date of Birth/Sex: October 18, 1975 (41 y.o. Male) Treating RN: Montey Hora Primary Care Asaiah Scarber: Kasandra Knudsen Other Clinician: Referring Kanda Deluna: Kasandra Knudsen Treating Kaziah Krizek/Extender: Frann Rider in Treatment: 7 Edema Assessment Assessed: [Left: No] [Right: No] Edema: [Left: N] [Right: o] Vascular Assessment Pulses: Dorsalis Pedis Palpable: [Left:Yes] Posterior Tibial Extremity colors, hair growth, and conditions: Extremity Color: [Left:Hyperpigmented] Hair Growth on Extremity: [Left:No] Temperature of Extremity: [Left:Warm] Capillary Refill: [Left:< 3 seconds] Electronic Signature(s) Signed: 07/24/2016 5:13:15 PM By: Montey Hora Entered By: Montey Hora on 07/24/2016 10:31:39 [name] [name] ([id number]) -------------------------------------------------------------------------------- Multi Wound Chart Details Patient Name: [name] [name]. Date of Service: 07/24/2016 10:15 AM Medical Record Number: [id number] Patient Account Number: [id number] Date of Birth/Sex: 07/04/1975 (41 y.o. Male) Treating RN: Montey Hora Primary Care Romy Ipock: Kasandra Knudsen Other Clinician: Referring Yue Flanigan: Kasandra Knudsen Treating Kamelia Lampkins/Extender: Frann Rider in  Treatment: 7 Vital Signs Height(in): 73 Pulse(bpm): 79 Weight(lbs): 285 Blood Pressure 129/74 (mmHg): Body Mass Index(BMI): 38 Temperature(F): 98.6 Respiratory Rate 18 (breaths/min): Photos: [N/A:N/A] Wound Location: Left Lower Leg - Medial N/A N/A Wounding Event: Blister N/A N/A Primary Etiology: Diabetic Wound/Ulcer of N/A N/A the Lower Extremity Secondary Etiology: Venous Leg Ulcer N/A N/A Comorbid History: Hypertension, Type II N/A N/A Diabetes Date Acquired: 06/03/2016 N/A N/A Weeks of Treatment: 7 N/A N/A Wound Status: Open N/A N/A Measurements L x W x D 6.2x6.5x0.1 N/A N/A (cm) Area (cm) : 31.652 N/A N/A Volume (cm) : 3.165 N/A N/A % Reduction in Area: 56.20% N/A N/A % Reduction in Volume: 56.20% N/A N/A Classification: Grade 1 N/A N/A  Exudate Amount: Large N/A N/A Exudate Type: Serous N/A N/A Exudate Color: amber N/A N/A Wound Margin: Flat and Intact N/A N/A Granulation Amount: Medium (34-66%) N/A N/A Granulation Quality: Red N/A N/A [name] [name], [name] [name] ([id number]) Necrotic Amount: Medium (34-66%) N/A N/A Necrotic Tissue: Eschar, Adherent Slough N/A N/A Exposed Structures: Fascia: No N/A N/A Fat Layer (Subcutaneous Tissue) Exposed: No Tendon: No Muscle: No Joint: No Bone: No Limited to Skin Breakdown Epithelialization: Small (1-33%) N/A N/A Periwound Skin Texture: Excoriation: No N/A N/A Induration: No Callus: No Crepitus: No Rash: No Scarring: No Periwound Skin Maceration: No N/A N/A Moisture: Dry/Scaly: No Periwound Skin Color: Erythema: Yes N/A N/A Atrophie Blanche: No Cyanosis: No Ecchymosis: No Hemosiderin Staining: No Mottled: No Pallor: No Rubor: No Erythema Location: Circumferential N/A N/A Temperature: No Abnormality N/A N/A Tenderness on Yes N/A N/A Palpation: Wound Preparation: Ulcer Cleansing: N/A N/A Rinsed/Irrigated with Saline Topical Anesthetic Applied: Other: lidocaine 4% Treatment Notes Wound #1 (Left, Medial  Lower Leg) 1. Cleansed with: Cleanse wound with antibacterial soap and water 2. Anesthetic Topical Lidocaine 4% cream to wound bed prior to debridement 4. Dressing Applied: Santyl Ointment Other dressing (specify in notes) [name] [name], [name] [name]. ([id number]) 5. Secondary Dressing Applied ABD Pad 7. Secured with 3 Layer Compression System - Left Lower Extremity Notes xtrasorb Electronic Signature(s) Signed: 07/24/2016 11:25:54 AM By: Christin Fudge MD, FACS Previous Signature: 07/24/2016 10:39:32 AM Version By: Montey Hora Entered By: Christin Fudge on 07/24/2016 11:25:54 [name] [name] ([id number]) -------------------------------------------------------------------------------- [place] Details Patient Name: [name] [name]. Date of Service: 07/24/2016 10:15 AM Medical Record Number: [id number] Patient Account Number: [id number] Date of Birth/Sex: 1976/03/08 (41 y.o. Male) Treating RN: Montey Hora Primary Care Josefita Weissmann: Kasandra Knudsen Other Clinician: Referring Prachi Oftedahl: Kasandra Knudsen Treating Kale Rondeau/Extender: Frann Rider in Treatment: 7 Active Inactive ` Orientation to the Wound Care Program Nursing Diagnoses: Knowledge deficit related to the wound healing center program Goals: Patient/caregiver will verbalize understanding of the [place] Program Date Initiated: 06/05/2016 Target Resolution Date: 08/29/2016 Goal Status: Active Interventions: Provide education on orientation to the wound center Notes: ` Wound/Skin Impairment Nursing Diagnoses: Knowledge deficit related to ulceration/compromised skin integrity Goals: Patient/caregiver will verbalize understanding of skin care regimen Date Initiated: 06/05/2016 Target Resolution Date: 08/29/2016 Goal Status: Active Ulcer/skin breakdown will have a volume reduction of 30% by week 4 Date Initiated: 06/05/2016 Target Resolution Date: 08/29/2016 Goal Status: Active Ulcer/skin  breakdown will have a volume reduction of 50% by week 8 Date Initiated: 06/05/2016 Target Resolution Date: 08/29/2016 Goal Status: Active Ulcer/skin breakdown will have a volume reduction of 80% by week 12 Date Initiated: 06/05/2016 Target Resolution Date: 08/29/2016 Goal Status: Active Ulcer/skin breakdown will heal within 14 weeks Date Initiated: 06/05/2016 Target Resolution Date: 08/29/2016 [name] [name], [name] [name] ([id number]) Goal Status: Active Interventions: Assess patient/caregiver ability to obtain necessary supplies Assess patient/caregiver ability to perform ulcer/skin care regimen upon admission and as needed Assess ulceration(s) every visit Notes: Electronic Signature(s) Signed: 07/24/2016 10:39:26 AM By: Montey Hora Entered By: Montey Hora on 07/24/2016 10:39:25 [name] [name] ([id number]) -------------------------------------------------------------------------------- Pain Assessment Details Patient Name: [name] [name]. Date of Service: 07/24/2016 10:15 AM Medical Record Number: [id number] Patient Account Number: [id number] Date of Birth/Sex: Feb 14, 1976 (41 y.o. Male) Treating RN: Montey Hora Primary Care Velva Molinari: Kasandra Knudsen Other Clinician: Referring Jameek Bruntz: Kasandra Knudsen Treating Orpah Hausner/Extender: Frann Rider in Treatment: 7 Active Problems Location of Pain Severity and Description of Pain Patient Has Paino No Site Locations Pain Management and Medication Current Pain Management:  Notes Topical or injectable lidocaine is offered to patient for acute pain when surgical debridement is performed. If needed, Patient is instructed to use over the counter pain medication for the following 24-48 hours after debridement. Wound care MDs do not prescribed pain medications. Patient has chronic pain or uncontrolled pain. Patient has been instructed to make an appointment with their Primary Care Physician for pain management. Electronic Signature(s) Signed:  07/24/2016 5:13:15 PM By: Montey Hora Entered By: Montey Hora on 07/24/2016 10:24:49 [name] [name] ([id number]) -------------------------------------------------------------------------------- Patient/Caregiver Education Details Patient Name: [name] [name]. Date of Service: 07/24/2016 10:15 AM Medical Record Number: [id number] Patient Account Number: [id number] Date of Birth/Gender: 30-Aug-1975 (41 y.o. Male) Treating RN: Montey Hora Primary Care Physician: Kasandra Knudsen Other Clinician: Referring Physician: Kasandra Knudsen Treating Physician/Extender: Frann Rider in Treatment: 7 Education Assessment Education Provided To: Patient Education Topics Provided Wound/Skin Impairment: Handouts: Other: wound care to continue as ordered Methods: Demonstration, Explain/Verbal Responses: State content correctly Electronic Signature(s) Signed: 07/24/2016 5:13:15 PM By: Montey Hora Entered By: Montey Hora on 07/24/2016 10:40:08 [name] [name] ([id number]) -------------------------------------------------------------------------------- Wound Assessment Details Patient Name: [name] [name]. Date of Service: 07/24/2016 10:15 AM Medical Record Number: [id number] Patient Account Number: [id number] Date of Birth/Sex: 14-Aug-1975 (41 y.o. Male) Treating RN: Montey Hora Primary Care Dayannara Pascal: Kasandra Knudsen Other Clinician: Referring Satina Jerrell: Kasandra Knudsen Treating Armentha Branagan/Extender: Frann Rider in Treatment: 7 Wound Status Wound Number: 1 Primary Etiology: Diabetic Wound/Ulcer of the Lower Extremity Wound Location: Left Lower Leg - Medial Secondary Venous Leg Ulcer Wounding Event: Blister Etiology: Date Acquired: 06/03/2016 Wound Status: Open Weeks Of Treatment: 7 Comorbid Hypertension, Type II Diabetes Clustered Wound: No History: Photos Photo Uploaded By: Montey Hora on 07/24/2016 10:35:13 Wound Measurements Length: (cm) 6.2 Width:  (cm) 6.5 Depth: (cm) 0.1 Area: (cm) 31.652 Volume: (cm) 3.165 % Reduction in Area: 56.2% % Reduction in Volume: 56.2% Epithelialization: Small (1-33%) Tunneling: No Undermining: No Wound Description Classification: Grade 1 Foul Odor Afte Wound Margin: Flat and Intact Slough/Fibrino Exudate Amount: Large Exudate Type: Serous Exudate Color: amber r Cleansing: No No Wound Bed Granulation Amount: Medium (34-66%) Exposed Structure Granulation Quality: Red Fascia Exposed: No Necrotic Amount: Medium (34-66%) Fat Layer (Subcutaneous Tissue) Exposed: No Necrotic Quality: Eschar, Adherent Slough Tendon Exposed: No [name] [name], [name] [name] ([id number]) Muscle Exposed: No Joint Exposed: No Bone Exposed: No Limited to Skin Breakdown Periwound Skin Texture Texture Color No Abnormalities Noted: No No Abnormalities Noted: No Callus: No Atrophie Blanche: No Crepitus: No Cyanosis: No Excoriation: No Ecchymosis: No Induration: No Erythema: Yes Rash: No Erythema Location: Circumferential Scarring: No Hemosiderin Staining: No Mottled: No Moisture Pallor: No No Abnormalities Noted: No Rubor: No Dry / Scaly: No Maceration: No Temperature / Pain Temperature: No Abnormality Tenderness on Palpation: Yes Wound Preparation Ulcer Cleansing: Rinsed/Irrigated with Saline Topical Anesthetic Applied: Other: lidocaine 4%, Treatment Notes Wound #1 (Left, Medial Lower Leg) 1. Cleansed with: Cleanse wound with antibacterial soap and water 2. Anesthetic Topical Lidocaine 4% cream to wound bed prior to debridement 4. Dressing Applied: Santyl Ointment Other dressing (specify in notes) 5. Secondary Dressing Applied ABD Pad 7. Secured with 3 Layer Compression System - Left Lower Extremity Notes xtrasorb Electronic Signature(s) Signed: 07/24/2016 5:13:15 PM By: Montey Hora Entered By: Montey Hora on 07/24/2016 10:31:10 [name] [name]  ([id number]) -------------------------------------------------------------------------------- [place] Details Patient Name: [name] [name]. Date of Service: 07/24/2016 10:15 AM Medical Record Number: [id number] Patient Account Number: [id number] Date of Birth/Sex: 1976-04-17 (41 y.o. Male) Treating RN: Montey Hora  Primary Care Adja Ruff: Kasandra Knudsen Other Clinician: Referring Abhiraj Dozal: Kasandra Knudsen Treating Rhilee Currin/Extender: Frann Rider in Treatment: 7 Vital Signs Time Taken: 10:24 Temperature (F): 98.6 Height (in): 73 Pulse (bpm): 79 Weight (lbs): 285 Respiratory Rate (breaths/min): 18 Body Mass Index (BMI): 37.6 Blood Pressure (mmHg): 129/74 Reference Range: 80 - 120 mg / dl Electronic Signature(s) Signed: 07/24/2016 5:13:15 PM By: Montey Hora Entered By: Montey Hora on 07/24/2016 10:25:37

## 2016-07-25 NOTE — Progress Notes (Addendum)
DARRION, WYSZYNSKI ([id number]) Visit Report for 07/24/2016 Chief Complaint Document Details Patient Name: [name] [name], [name] [name]. Date of Service: 07/24/2016 10:15 AM Medical Record Number: [id number] Patient Account Number: [id number] Date of Birth/Sex: Aug 30, 1975 (41 y.o. Male) Treating RN: Montey Hora Primary Care Provider: Kasandra Knudsen Other Clinician: Referring Provider: Kasandra Knudsen Treating Provider/Extender: Frann Rider in Treatment: 7 Information Obtained from: Patient Chief Complaint Patients presents for treatment of an open diabetic ulcer the left lower extremity for 5 days Electronic Signature(s) Signed: 07/24/2016 11:26:04 AM By: Christin Fudge MD, FACS Entered By: Christin Fudge on 07/24/2016 11:26:04 [name] [name] ([id number]) -------------------------------------------------------------------------------- HPI Details Patient Name: [name] [name]. Date of Service: 07/24/2016 10:15 AM Medical Record Number: [id number] Patient Account Number: [id number] Date of Birth/Sex: 01-11-1976 (41 y.o. Male) Treating RN: Montey Hora Primary Care Provider: Kasandra Knudsen Other Clinician: Referring Provider: Kasandra Knudsen Treating Provider/Extender: Frann Rider in Treatment: 7 History of Present Illness Location: left lower extremity Quality: Patient reports experiencing a dull pain to affected area(s). Severity: Patient states wound (s) are getting better. Duration: Patient has had the wound for < 2 weeks prior to presenting for treatment Timing: Pain in wound is Intermittent (comes and goes Context: The wound appeared gradually over time Modifying Factors: Other treatment(s) tried include:local care and oral antibiotic Associated Signs and Symptoms: Patient reports having increase swelling. HPI Description: 41 year old gentleman who has noticed a blister on his left lower extremity which came up 5 days ago and it has drained fluid. He was seen by  hispractitioner who reviewed him and put him on doxycycline now asked him to come to the wound center. His past medical history is significant for diabetes mellitus type 2, congestive heart failure, renal failure, hypertension, and he is receiving hemodialysis 3 times a week. He has never been a smoker.Marland Kitchen 06/12/2016 -- the patient has in continue with hemodialysis and no changes have occurred in the last week. 07/03/2016 -- the patient has been doing his dressings only every other day and has not been washing was scrubbing the wound as advised 07/10/2016 -- the patient did not schedule his venous duplex study for reflux. He is agreeable about getting a wound biopsy done today. 07/24/2016 -- pathology report -- DIAGNOSIS: A. 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 - NEGATIVE FOR MALIGNANCY -- venous study done on 07/22/2016 shows venous incompetence in the bilateral great saphenous vein as well as the deep venous system. No evidence of deep or superficial venous thrombosis in bilateral lower extremity. Electronic Signature(s) Signed: 07/24/2016 11:26:13 AM By: Christin Fudge MD, FACS Previous Signature: 07/24/2016 10:32:22 AM Version By: Christin Fudge MD, FACS Previous Signature: 07/24/2016 10:26:22 AM Version By: Christin Fudge MD, FACS Entered By: Christin Fudge on 07/24/2016 11:26:13 [name] [name] ([id number]) -------------------------------------------------------------------------------- Physical Exam Details Patient Name: [name] [name]. Date of Service: 07/24/2016 10:15 AM Medical Record Number: [id number] Patient Account Number: [id number] Date of Birth/Sex: 05/15/1975 (41 y.o. Male) Treating RN: Montey Hora Primary Care Provider: Kasandra Knudsen Other Clinician: Referring Provider: Kasandra Knudsen Treating Provider/Extender: Frann Rider in Treatment: 7 Constitutional . Pulse regular.  Respirations normal and unlabored. Afebrile. . Eyes Nonicteric. Reactive to light. Ears, Nose, Mouth, and Throat Lips, teeth, and gums WNL.Marland Kitchen Moist mucosa without lesions. Neck supple and nontender. No palpable supraclavicular or cervical adenopathy. Normal sized without goiter. Respiratory WNL. No retractions.. Breath sounds WNL, No rubs, rales, rhonchi, or wheeze.. Cardiovascular Heart rhythm and rate  regular, no murmur or gallop.. Pedal Pulses WNL. No clubbing, cyanosis or edema. Chest Breasts symmetical and no nipple discharge.. Breast tissue WNL, no masses, lumps, or tenderness.. Lymphatic No adneopathy. No adenopathy. No adenopathy. Musculoskeletal Adexa without tenderness or enlargement.. Digits and nails w/o clubbing, cyanosis, infection, petechiae, ischemia, or inflammatory conditions.. Integumentary (Hair, Skin) No suspicious lesions. No crepitus or fluctuance. No peri-wound warmth or erythema. No masses.Marland Kitchen Psychiatric Judgement and insight Intact.. No evidence of depression, anxiety, or agitation.. Notes there is subcutaneous debris which will be treated with Santyl ointment locally and a compression wrap. The patient finds it extremely painful for sharp debridement and hence we will not do this Electronic Signature(s) Signed: 07/24/2016 11:26:53 AM By: Christin Fudge MD, FACS Entered By: Christin Fudge on 07/24/2016 11:26:53 [name] [name] ([id number]) -------------------------------------------------------------------------------- Physician Orders Details Patient Name: [name] [name]. Date of Service: 07/24/2016 10:15 AM Medical Record Number: [id number] Patient Account Number: [id number] Date of Birth/Sex: [birth year] (41 y.o. Male) Treating RN: Montey Hora Primary Care Provider: Kasandra Knudsen Other Clinician: Referring Provider: Kasandra Knudsen Treating Provider/Extender: Frann Rider in Treatment: 7 Verbal / Phone Orders: No Diagnosis Coding Wound  Cleansing Wound #1 Left,Medial Lower Leg o Clean wound with Normal Saline. Anesthetic Wound #1 Left,Medial Lower Leg o Topical Lidocaine 4% cream applied to wound bed prior to debridement Primary Wound Dressing Wound #1 Left,Medial Lower Leg o Santyl Ointment Secondary Dressing Wound #1 Left,Medial Lower Leg o ABD pad Dressing Change Frequency Wound #1 Left,Medial Lower Leg o Change dressing every week Follow-up Appointments Wound #1 Left,Medial Lower Leg o Return Appointment in 1 week. o Nurse Visit as needed Edema Control Wound #1 Left,Medial Lower Leg o 3 Layer Compression System - Left Lower Extremity Electronic Signature(s) Signed: 07/24/2016 3:13:18 PM By: Christin Fudge MD, FACS Signed: 07/24/2016 5:13:15 PM By: Montey Hora Entered By: Montey Hora on 07/24/2016 10:54:20 [name] [name] ([id number]) LORD, LANCOUR ([id number]) -------------------------------------------------------------------------------- Problem List Details Patient Name: CYRIL, WOODMANSEE. Date of Service: 07/24/2016 10:15 AM Medical Record Number: [id number] Patient Account Number: [id number] Date of Birth/Sex: 01-21-1976 (41 y.o. 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 Date of Service: 07/24/2016 10:15 AM Medical Record Number: [id number] Patient Account Number: [id number] Date of Birth/Sex: January 02, 1976 (41 y.o. Male) Treating RN: Montey Hora Primary Care Provider: Kasandra Knudsen Other Clinician: Referring Provider: Kasandra Knudsen Treating Provider/Extender: Frann Rider in Treatment: 7 Subjective Chief Complaint Information obtained from Patient Patients presents for treatment of an open diabetic ulcer the left lower extremity for 5 days History of Present Illness (HPI) The following HPI elements were documented for the patient's wound: Location: left lower extremity Quality: Patient reports experiencing a dull pain to affected area(s). Severity: Patient states wound (s) are getting better. Duration: Patient has had the wound for < 2 weeks prior to presenting for treatment Timing: Pain in wound is Intermittent (comes and goes  Context: The wound appeared gradually over time Modifying Factors: Other treatment(s) tried include:local care and oral antibiotic Associated Signs and Symptoms: Patient reports having increase swelling. 41 year old gentleman who has noticed a blister on his left lower extremity which came up 5 days ago and it has drained fluid. He was seen by hispractitioner who reviewed him and put him on doxycycline now asked him to come to the wound center. His past medical history is significant for diabetes mellitus type 2, congestive heart failure, renal failure, hypertension, and he is receiving hemodialysis 3 times a week. He has never been a smoker.Marland Kitchen 06/12/2016 -- the patient has in continue with hemodialysis and no changes have occurred in the last week. 07/03/2016 -- the patient has been doing his dressings only every other day and has not been washing was scrubbing the wound  as advised 07/10/2016 -- the patient did not schedule his venous duplex study for reflux. He is agreeable about getting a wound biopsy done today. 07/24/2016 -- pathology report -- DIAGNOSIS: A. 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 - NEGATIVE FOR MALIGNANCY -- venous study done on 07/22/2016 shows venous incompetence in the bilateral great saphenous vein as well as the deep venous system. No evidence of deep or superficial venous thrombosis in bilateral lower extremity. AUBRA, PAPPALARDO D. ([id number]) Objective Constitutional Pulse regular. Respirations normal and unlabored. Afebrile. Vitals Time Taken: 10:24 AM, Height: 73 in, Weight: 285 lbs, BMI: 37.6, Temperature: 98.6 F, Pulse: 79 bpm, Respiratory Rate: 18 breaths/min, Blood Pressure: 129/74 mmHg. Eyes Nonicteric. Reactive to light. Ears, Nose, Mouth, and Throat Lips, teeth, and gums WNL.Marland Kitchen Moist mucosa without lesions. Neck supple and nontender. No palpable supraclavicular or cervical adenopathy. Normal sized without goiter. Respiratory WNL. No retractions.. Breath sounds WNL, No rubs, rales, rhonchi, or wheeze.. Cardiovascular Heart rhythm and rate regular, no murmur or gallop.. Pedal Pulses WNL. No clubbing, cyanosis or edema. Chest Breasts symmetical and no nipple discharge.. Breast tissue WNL, no masses, lumps, or tenderness.. Lymphatic No adneopathy. No adenopathy. No adenopathy. Musculoskeletal Adexa without tenderness or enlargement.. Digits and nails w/o clubbing, cyanosis, infection, petechiae, ischemia, or inflammatory conditions.Marland Kitchen Psychiatric Judgement and insight Intact.. No evidence of depression, anxiety, or agitation.. General Notes: there is subcutaneous debris which will be treated with Santyl ointment locally and a compression wrap. The patient finds it extremely painful for sharp debridement and hence we will not  do this Integumentary (Hair, Skin) No suspicious lesions. No crepitus or fluctuance. No peri-wound warmth or erythema. No masses.. Wound #1 status is Open. Original cause of wound was Blister. The wound is located on the Left,Medial Lower Leg. The wound measures 6.2cm length x 6.5cm width x 0.1cm depth; 31.652cm^2 area and 3.165cm^3 volume. The wound is limited to skin breakdown. There is no tunneling or undermining noted. There is a large amount of serous drainage noted. The wound margin is flat and intact. There is medium EASTER, SCHINKE. ([id number]) (34-66%) red granulation within the wound bed. There is a medium (34-66%) amount of necrotic tissue within the wound bed including Eschar and Adherent Slough. The periwound skin appearance exhibited: Erythema. The periwound skin appearance did not exhibit: Callus, Crepitus, Excoriation, Induration, Rash, Scarring, Dry/Scaly, Maceration, Atrophie Blanche, Cyanosis, Ecchymosis, Hemosiderin Staining, Mottled, Pallor, Rubor. The surrounding wound skin color is noted with erythema which is circumferential. Periwound temperature was noted as No Abnormality. The periwound has tenderness on palpation. 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 Anesthetic: Wound #1 Left,Medial Lower Leg: Topical Lidocaine 4% cream applied to wound bed prior to debridement Primary Wound Dressing: Wound #1 Left,Medial Lower Leg: Santyl Ointment Secondary Dressing: Wound #1 Left,Medial Lower Leg: ABD pad Dressing Change Frequency: Wound #1 Left,Medial Lower Leg: Change dressing every week Follow-up Appointments: Wound #1 Left,Medial Lower Leg: Return  Appointment in 1 week. Nurse Visit as needed Edema Control: Wound #1 Left,Medial Lower Leg: 3 Layer Compression System - Left Lower Extremity COTE, MAYABB ([id number]) I done his biopsy and checked his venous duplex study after review of these, I have recommended: 1. Santyl ointment and a the layer Profore wrap to be changed weekly 2. Elevation and exercise 3. Good control of his diabetes mellitus 4. venous duplex reflux study of both lower extremities -- is also reviewed with him and I have asked him to see a venous center for further treatment. Would like to get this done at the vascular team at [facility] 5. I have done a biopsy of skin edges under local anesthesia -- pathology discussed with him 6. Review next week in the wound center Electronic Signature(s) Signed: 07/24/2016 11:29:03 AM By: Christin Fudge MD, FACS Entered By: Christin Fudge on 07/24/2016 11:29:03 [name] [name] ([id number]) -------------------------------------------------------------------------------- SuperBill Details Patient Name: [name] [name]. Date of Service: 07/24/2016 Medical Record Number: [id number] Patient Account Number: [id number] Date of Birth/Sex: 1975-06-27 (41 y.o. 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 Facility Procedures CPT4: Description Modifier Quantity Code [id number] (Facility Use Only) [phone] - [facility] 1 LEG Physician Procedures CPT4 Code: [id number] Description: 10272 - WC PHYS LEVEL 3 - EST PT ICD-10 Description Diagnosis E11.622 Type 2  diabetes mellitus with other skin ulcer I83.022 Varicose veins of left lower extremity with ulcer L97.222 Non-pressure chronic ulcer of left calf with fat Z99.2  Dependence on renal dialysis Modifier: of calf layer exposed Quantity: 1 Electronic Signature(s) Signed: 07/24/2016 1:18:44 PM By: Montey Hora Signed: 07/24/2016 3:13:18 PM By: Christin Fudge MD, FACS Previous Signature: 07/24/2016 11:29:20 AM Version By: Christin Fudge MD, FACS Entered By: Montey Hora on 07/24/2016 13:18:44

## 2016-07-31 ENCOUNTER — Encounter: Payer: Medicare Other | Attending: Surgery | Admitting: Surgery

## 2016-07-31 DIAGNOSIS — L97222 Non-pressure chronic ulcer of left calf with fat layer exposed: Secondary | ICD-10-CM | POA: Insufficient documentation

## 2016-07-31 DIAGNOSIS — Z992 Dependence on renal dialysis: Secondary | ICD-10-CM | POA: Insufficient documentation

## 2016-07-31 DIAGNOSIS — I83022 Varicose veins of left lower extremity with ulcer of calf: Secondary | ICD-10-CM | POA: Insufficient documentation

## 2016-07-31 DIAGNOSIS — Z794 Long term (current) use of insulin: Secondary | ICD-10-CM | POA: Diagnosis not present

## 2016-07-31 DIAGNOSIS — E11622 Type 2 diabetes mellitus with other skin ulcer: Secondary | ICD-10-CM | POA: Diagnosis not present

## 2016-07-31 DIAGNOSIS — Z7901 Long term (current) use of anticoagulants: Secondary | ICD-10-CM | POA: Diagnosis not present

## 2016-07-31 DIAGNOSIS — E1122 Type 2 diabetes mellitus with diabetic chronic kidney disease: Secondary | ICD-10-CM | POA: Diagnosis not present

## 2016-07-31 DIAGNOSIS — I509 Heart failure, unspecified: Secondary | ICD-10-CM | POA: Diagnosis not present

## 2016-07-31 DIAGNOSIS — I130 Hypertensive heart and chronic kidney disease with heart failure and stage 1 through stage 4 chronic kidney disease, or unspecified chronic kidney disease: Secondary | ICD-10-CM | POA: Insufficient documentation

## 2016-07-31 DIAGNOSIS — Z79899 Other long term (current) drug therapy: Secondary | ICD-10-CM | POA: Insufficient documentation

## 2016-07-31 DIAGNOSIS — N189 Chronic kidney disease, unspecified: Secondary | ICD-10-CM | POA: Diagnosis not present

## 2016-08-04 NOTE — Progress Notes (Signed)
ESTANISLADO, SURGEON ([id number]) Visit Report for 07/31/2016 Chief Complaint Document Details Patient Name: [name] [name], [name] [name]. Date of Service: 07/31/2016 11:00 AM Medical Record Number: [id number] Patient Account Number: [id number] Date of Birth/Sex: [birth year] (41 y.o. Male) Treating RN: Montey Hora Primary Care Provider: Kasandra Knudsen Other Clinician: Referring Provider: Kasandra Knudsen Treating Provider/Extender: Frann Rider in Treatment: 8 Information Obtained from: Patient Chief Complaint Patients presents for treatment of an open diabetic ulcer the left lower extremity for 5 days Electronic Signature(s) Signed: 07/31/2016 12:10:31 PM By: Christin Fudge MD, FACS Entered By: Christin Fudge on 07/31/2016 12:10:31 [name] [name] ([id number]) -------------------------------------------------------------------------------- Debridement Details Patient Name: [name] [name]. Date of Service: 07/31/2016 11:00 AM Medical Record Number: [id number] Patient Account Number: [id number] Date of Birth/Sex: 08-Oct-1975 (41 y.o. Male) Treating RN: Montey Hora Primary Care Provider: Kasandra Knudsen Other Clinician: Referring Provider: Kasandra Knudsen Treating Provider/Extender: Frann Rider in Treatment: 8 Debridement Performed for Wound #1 Left,Medial Lower Leg Assessment: Performed By: Physician Christin Fudge, MD Debridement: Debridement Pre-procedure Yes - 11:48 Verification/Time Out Taken: Start Time: 11:48 Pain Control: Lidocaine 4% Topical Solution Level: Skin/Subcutaneous Tissue Total Area Debrided (L x 6.2 (cm) x 5.6 (cm) = 34.72 (cm) W): Tissue and other Viable, Non-Viable, Fibrin/Slough, Subcutaneous material debrided: Instrument: Curette Bleeding: Minimum Hemostasis Achieved: Pressure End Time: 11:53 Procedural Pain: 0 Post Procedural Pain: 0 Response to Treatment: Procedure was tolerated well Post Debridement Measurements of Total Wound Length: (cm)  6.2 Width: (cm) 5.6 Depth: (cm) 0.1 Volume: (cm) 2.727 Character of Wound/Ulcer Post Improved Debridement: Severity of Tissue Post Debridement: Fat layer exposed Post Procedure Diagnosis Same as Pre-procedure Electronic Signature(s) Signed: 07/31/2016 12:10:21 PM By: Christin Fudge MD, FACS Signed: 08/04/2016 9:41:00 AM By: Montey Hora Previous Signature: 07/31/2016 12:10:13 PM Version By: Christin Fudge MD, FACS Entered By: Christin Fudge on 07/31/2016 12:10:21 [name] [name] ([id number]) NEFI, MUSICH ([id number]) -------------------------------------------------------------------------------- HPI Details Patient Name: [name] [name]. Date of Service: 07/31/2016 11:00 AM Medical Record Number: [id number] Patient Account Number: [id number] Date of Birth/Sex: 10-09-1975 (41 y.o. Male) Treating RN: Montey Hora Primary Care Provider: Kasandra Knudsen Other Clinician: Referring Provider: Kasandra Knudsen Treating Provider/Extender: Frann Rider in Treatment: 8 History of Present Illness Location: left lower extremity Quality: Patient reports experiencing a dull pain to affected area(s). Severity: Patient states wound (s) are getting better. Duration: Patient has had the wound for < 2 weeks prior to presenting for treatment Timing: Pain in wound is Intermittent (comes and goes Context: The wound appeared gradually over time Modifying Factors: Other treatment(s) tried include:local care and oral antibiotic Associated Signs and Symptoms: Patient reports having increase swelling. HPI Description: 41 year old gentleman who has noticed a blister on his left lower extremity which came up 5 days ago and it has drained fluid. He was seen by hispractitioner who reviewed him and put him on doxycycline now asked him to come to the wound center. His past medical history is significant for diabetes mellitus type 2, congestive heart failure, renal failure, hypertension, and he is  receiving hemodialysis 3 times a week. He has never been a smoker.Marland Kitchen 06/12/2016 -- the patient has in continue with hemodialysis and no changes have occurred in the last week. 07/03/2016 -- the patient has been doing his dressings only every other day and has not been washing was scrubbing the wound as advised 07/10/2016 -- the patient did not schedule his venous duplex study for reflux. He is agreeable about getting a wound biopsy done today. 07/24/2016 -- pathology report --  DIAGNOSIS: A. 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 - NEGATIVE FOR MALIGNANCY -- venous study done on 07/22/2016 shows venous incompetence in the bilateral great saphenous vein as well as the deep venous system. No evidence of deep or superficial venous thrombosis in bilateral lower extremity. 07/31/2016 -- the patient has a appointment to see the surgeons at [place] next month Electronic Signature(s) Signed: 07/31/2016 12:10:55 PM By: Christin Fudge MD, FACS Entered By: Christin Fudge on 07/31/2016 12:10:54 [name] [name] ([id number]) -------------------------------------------------------------------------------- Physical Exam Details Patient Name: [name] [name]. Date of Service: 07/31/2016 11:00 AM Medical Record Number: [id number] Patient Account Number: [id number] Date of Birth/Sex: March 01, 1976 (41 y.o. Male) Treating RN: Montey Hora Primary Care Provider: Kasandra Knudsen Other Clinician: Referring Provider: Kasandra Knudsen Treating Provider/Extender: Frann Rider in Treatment: 8 Constitutional . Pulse regular. Respirations normal and unlabored. Afebrile. . Eyes Nonicteric. Reactive to light. Ears, Nose, Mouth, and Throat Lips, teeth, and gums WNL.Marland Kitchen Moist mucosa without lesions. Neck supple and nontender. No palpable supraclavicular or cervical adenopathy. Normal sized without goiter. Respiratory WNL. No  retractions.. Breath sounds WNL, No rubs, rales, rhonchi, or wheeze.. Cardiovascular Heart rhythm and rate regular, no murmur or gallop.. Pedal Pulses WNL. No clubbing, cyanosis or edema. Chest Breasts symmetical and no nipple discharge.. Breast tissue WNL, no masses, lumps, or tenderness.. Lymphatic No adneopathy. No adenopathy. No adenopathy. Musculoskeletal Adexa without tenderness or enlargement.. Digits and nails w/o clubbing, cyanosis, infection, petechiae, ischemia, or inflammatory conditions.. Integumentary (Hair, Skin) No suspicious lesions. No crepitus or fluctuance. No peri-wound warmth or erythema. No masses.Marland Kitchen Psychiatric Judgement and insight Intact.. No evidence of depression, anxiety, or agitation.. Notes the patient has a lot of subcutaneous debris on his left lower extremity which need sharp debridement with a #3 curet and he tolerated well today Electronic Signature(s) Signed: 07/31/2016 12:11:24 PM By: Christin Fudge MD, FACS Entered By: Christin Fudge on 07/31/2016 12:11:23 [name] [name] ([id number]) -------------------------------------------------------------------------------- Physician Orders Details Patient Name: [name] [name]. Date of Service: 07/31/2016 11:00 AM Medical Record Number: [id number] Patient Account Number: [id number] Date of Birth/Sex: 1975-11-28 (41 y.o. Male) Treating RN: Montey Hora Primary Care Provider: Kasandra Knudsen Other Clinician: Referring Provider: Kasandra Knudsen Treating Provider/Extender: Frann Rider in Treatment: 8 Verbal / Phone Orders: No Diagnosis Coding Wound Cleansing Wound #1 Left,Medial Lower Leg o Clean wound with Normal Saline. Anesthetic Wound #1 Left,Medial Lower Leg o Topical Lidocaine 4% cream applied to wound bed prior to debridement Primary Wound Dressing Wound #1 Left,Medial Lower Leg o Aquacel Ag Secondary Dressing Wound #1 Left,Medial Lower Leg o ABD pad o Other -  charcoal Dressing Change Frequency Wound #1 Left,Medial Lower Leg o Other: - twice weekly - once at [facility] and once by [facility] Follow-up Appointments Wound #1 Left,Medial Lower Leg o Return Appointment in 1 week. Edema Control Wound #1 Left,Medial Lower Leg o 3 Layer Compression System - Left Lower Extremity Home Health Wound #1 Left,Medial Lower Leg o Initiate Home Health for [place] Nurse may visit PRN to address patientos wound care needs. o FACE TO FACE ENCOUNTER: MEDICARE and MEDICAID PATIENTS: I certify that this patient is under my care and that I had a face-to-face encounter that meets the physician face-to-face ROYALE, SWAMY ([id number]) encounter requirements with this patient on this date. The encounter with the patient was in whole or in part for the following MEDICAL CONDITION: (primary reason for  Home Healthcare) MEDICAL NECESSITY: I certify, that based on my findings, NURSING services are a medically necessary home health service. HOME BOUND STATUS: I certify that my clinical findings support that this patient is homebound (i.e., Due to illness or injury, pt requires aid of supportive devices such as crutches, cane, wheelchairs, walkers, the use of special transportation or the assistance of another person to leave their place of residence. There is a normal inability to leave the home and doing so requires considerable and taxing effort. Other absences are for medical reasons / religious services and are infrequent or of short duration when for other reasons). o If current dressing causes regression in wound condition, may D/C ordered dressing product/s and apply Normal Saline Moist Dressing daily until next [place] / Other MD appointment. [place] of regression in wound condition at [phone]. o Please direct any NON-WOUND related issues/requests for orders to patient's Primary  Care Physician Electronic Signature(s) Signed: 07/31/2016 4:30:26 PM By: Christin Fudge MD, FACS Signed: 08/04/2016 9:41:00 AM By: Montey Hora Entered By: Montey Hora on 07/31/2016 11:54:50 [name] [name] ([id number]) -------------------------------------------------------------------------------- Problem List Details Patient Name: [name] [name]. Date of Service: 07/31/2016 11:00 AM Medical Record Number: [id number] Patient Account Number: [id number] Date of Birth/Sex: Dec 15, 1975 (41 y.o. 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 Date of Service: 07/31/2016 11:00 AM Medical Record Number: [id number] Patient Account Number: [id number] Date of Birth/Sex: 1975/08/21 (41 y.o. Male) Treating RN: Montey Hora Primary Care Provider: Kasandra Knudsen Other Clinician: Referring Provider: Kasandra Knudsen Treating Provider/Extender: Frann Rider in Treatment: 8 Subjective Chief Complaint Information obtained from Patient Patients presents for treatment of an open diabetic ulcer the left lower extremity for 5 days History of Present Illness (HPI) The  following HPI elements were documented for the patient's wound: Location: left lower extremity Quality: Patient reports experiencing a dull pain to affected area(s). Severity: Patient states wound (s) are getting better. Duration: Patient has had the wound for < 2 weeks prior to presenting for treatment Timing: Pain in wound is Intermittent (comes and goes Context: The wound appeared gradually over time Modifying Factors: Other treatment(s) tried include:local care and oral antibiotic Associated Signs and Symptoms: Patient reports having increase swelling. 41 year old gentleman who has noticed a blister on his left lower extremity which came up 5 days ago and it has drained fluid. He was seen by hispractitioner who reviewed him and put him on doxycycline now asked him to come to the wound center. His past medical history is significant for diabetes mellitus type 2, congestive heart failure, renal failure, hypertension, and he is receiving hemodialysis 3 times a week. He has never been a smoker.Marland Kitchen 06/12/2016 -- the patient has in continue with hemodialysis and no changes have occurred in the last week. 07/03/2016 -- the patient has been doing his dressings only every other day and has not been  washing was scrubbing the wound as advised 07/10/2016 -- the patient did not schedule his venous duplex study for reflux. He is agreeable about getting a wound biopsy done today. 07/24/2016 -- pathology report -- DIAGNOSIS: A. 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 - NEGATIVE FOR MALIGNANCY -- venous study done on 07/22/2016 shows venous incompetence in the bilateral great saphenous vein as well as the deep venous system. No evidence of deep or superficial venous thrombosis in bilateral lower extremity. 07/31/2016 -- the patient has a appointment to see the surgeons at [place] next month MADIX, BLOWE.  ([id number]) Objective Constitutional Pulse regular. Respirations normal and unlabored. Afebrile. Vitals Time Taken: 11:30 AM, Height: 73 in, Weight: 285 lbs, BMI: 37.6, Temperature: 98.5 F, Pulse: 83 bpm, Respiratory Rate: 18 breaths/min, Blood Pressure: 134/79 mmHg. Eyes Nonicteric. Reactive to light. Ears, Nose, Mouth, and Throat Lips, teeth, and gums WNL.Marland Kitchen Moist mucosa without lesions. Neck supple and nontender. No palpable supraclavicular or cervical adenopathy. Normal sized without goiter. Respiratory WNL. No retractions.. Breath sounds WNL, No rubs, rales, rhonchi, or wheeze.. Cardiovascular Heart rhythm and rate regular, no murmur or gallop.. Pedal Pulses WNL. No clubbing, cyanosis or edema. Chest Breasts symmetical and no nipple discharge.. Breast tissue WNL, no masses, lumps, or tenderness.. Lymphatic No adneopathy. No adenopathy. No adenopathy. Musculoskeletal Adexa without tenderness or enlargement.. Digits and nails w/o clubbing, cyanosis, infection, petechiae, ischemia, or inflammatory conditions.Marland Kitchen Psychiatric Judgement and insight Intact.. No evidence of depression, anxiety, or agitation.. General Notes: the patient has a lot of subcutaneous debris on his left lower extremity which need sharp debridement with a #3 curet and he tolerated well today Integumentary (Hair, Skin) No suspicious lesions. No crepitus or fluctuance. No peri-wound warmth or erythema. No masses.. Wound #1 status is Open. Original cause of wound was Blister. The wound is located on the Left,Medial Lower Leg. The wound measures 6.2cm length x 5.6cm width x 0.1cm depth; 27.269cm^2 area and 2.727cm^3 volume. The wound is limited to skin breakdown. There is no tunneling or undermining noted. There is a large amount of serous drainage noted. The wound margin is flat and intact. There is medium (34-66%) red granulation within the wound bed. There is a medium (34-66%) amount of necrotic tissue KHALIK, PEWITT. ([id number]) within the wound bed including Eschar and Adherent Slough. The periwound skin appearance exhibited: Erythema. The periwound skin appearance did not exhibit: Callus, Crepitus, Excoriation, Induration, Rash, Scarring, Dry/Scaly, Maceration, Atrophie Blanche, Cyanosis, Ecchymosis, Hemosiderin Staining, Mottled, Pallor, Rubor. The surrounding wound skin color is noted with erythema which is circumferential. Periwound temperature was noted as No Abnormality. The periwound has tenderness on palpation. 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 There was a Skin/Subcutaneous Tissue Debridement ([id number]) debridement with total area of 34.72 sq cm performed by Christin Fudge, MD. with the following instrument(s): Curette to remove Viable and Non-Viable tissue/material including Fibrin/Slough and Subcutaneous after achieving pain control using Lidocaine 4% Topical Solution. A time out was conducted at 11:48, prior to the start of the procedure. A Minimum amount of bleeding was controlled with Pressure. The procedure was tolerated  well with a pain level of 0 throughout and a pain level of 0 following the procedure. Post Debridement Measurements: 6.2cm length x 5.6cm width x 0.1cm depth; 2.727cm^3 volume. Character of Wound/Ulcer Post Debridement is improved. Severity of Tissue Post Debridement is: Fat layer exposed. Post procedure Diagnosis Wound #1: Same as Pre-Procedure Plan Wound Cleansing: Wound #1 Left,Medial Lower Leg: Clean wound with Normal Saline. SHEAN, GERDING DMarland Kitchen ([id number]) Anesthetic: Wound #1 Left,Medial Lower  Leg: Topical Lidocaine 4% cream applied to wound bed prior to debridement Primary Wound Dressing: Wound #1 Left,Medial Lower Leg: Aquacel Ag Secondary Dressing: Wound #1 Left,Medial Lower Leg: ABD pad Other - charcoal Dressing Change Frequency: Wound #1 Left,Medial Lower Leg: Other: - twice weekly - once at [facility] and once by [facility] Follow-up Appointments: Wound #1 Left,Medial Lower Leg: Return Appointment in 1 week. Edema Control: Wound #1 Left,Medial Lower Leg: 3 Layer Compression System - Left Lower Extremity Home Health: Wound #1 Left,Medial Lower Leg: [place] for [place] Nurse may visit PRN to address patient s wound care needs. FACE TO FACE ENCOUNTER: MEDICARE and MEDICAID PATIENTS: I certify that this patient is under my care and that I had a face-to-face encounter that meets the physician face-to-face encounter requirements with this patient on this date. The encounter with the patient was in whole or in part for the following MEDICAL CONDITION: (primary reason for [place]) MEDICAL NECESSITY: I certify, that based on my findings, NURSING services are a medically necessary home health service. HOME BOUND STATUS: I certify that my clinical findings support that this patient is homebound (i.e., Due to illness or injury, pt requires aid of supportive devices such as crutches, cane, wheelchairs, walkers, the use of special transportation or the assistance of another person to leave their place of residence. There is a normal inability to leave the home and doing so requires considerable and taxing effort. Other absences are for medical reasons / religious services and are infrequent or of short duration when for other reasons). If current dressing causes regression in wound condition, may D/C ordered dressing product/s and apply Normal Saline Moist Dressing daily until next [facility] / Other MD appointment. [place] of regression in wound condition at [phone]. Please direct any NON-WOUND related issues/requests for orders to patient's Primary Care Physician I have recommended: 1. Silver alginate and a 3 layer Profore wrap to be changed twice weekly 2. Elevation and exercise 3. Good control of his diabetes mellitus 4. earlier he had told me that he would like to see the vascular team at [facility], but today he says that he will be seeing [facility] next month BRISTOL, SOY. ([id number]) 5. Review next week in the wound center Electronic Signature(s) Signed: 07/31/2016 12:13:20 PM By: Christin Fudge MD, FACS Entered By: Christin Fudge on 07/31/2016 12:13:20 [name] [name] ([id number]) -------------------------------------------------------------------------------- SuperBill Details Patient Name: [name] [name]. Date of Service: 07/31/2016 Medical Record Number: [id number] Patient Account Number: [id number] Date of Birth/Sex: 27-Jul-1975 (41 y.o. 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 Facility Procedures CPT4 Code: [id number] Description: 15176 - DEB SUBQ TISSUE 20  SQ CM/< ICD-10 Description Diagnosis E11.622 Type 2 diabetes mellitus with other skin ulcer Z99.2 Dependence on renal dialysis L97.222 Non-pressure chronic ulcer of left calf with fat l I83.022 Varicose veins of  left lower extremity with ulcer Modifier: ayer exposed of calf Quantity: 1 CPT4 Code: [id number] Description: 94446 - DEB SUBQ TISS EA ADDL 20CM ICD-10 Description Diagnosis E11.622 Type  2 diabetes mellitus with other skin ulcer Z99.2 Dependence on renal dialysis L97.222 Non-pressure chronic ulcer of left calf with fat l I83.022 Varicose veins of  left lower extremity with ulcer Modifier: ayer exposed of calf Quantity: 1 Physician Procedures CPT4 Code: 1901222 Joseph Art Description: 11042 - WC PHYS SUBQ TISS 20 SQ CM ICD-10 Description Diagnosis E11.622 Type 2 diabetes mellitus with other skin ulcer Z99.2 Dependence on renal dialysis L97.222 Non-pressure chronic ulcer of left calf with fat la I83.022 Varicose veins of  left lower extremity with ulcer o E D. ([id number]) Modifier: yer exposed f calf Quantity: 1 Electronic Signature(s) Signed: 07/31/2016 12:13:47 PM By: Christin Fudge MD, FACS Entered By: Christin Fudge on 07/31/2016 12:13:47

## 2016-08-04 NOTE — Progress Notes (Signed)
QUINTAVIS, BRANDS ([id number]) Visit Report for 07/31/2016 Arrival Information Details Patient Name: [name] [name], [name] [name]. Date of Service: 07/31/2016 11:00 AM Medical Record Number: [id number] Patient Account Number: [id number] Date of Birth/Sex: 1975-10-15 (41 y.o. Male) Treating RN: Montey Hora Primary Care Javaya [place]: Kasandra Knudsen Other Clinician: Referring Saniya Tranchina: Kasandra Knudsen Treating Kaydee Magel/Extender: Frann Rider in Treatment: 8 Visit Information History Since Last Visit Added or deleted any medications: No Patient Arrived: Ambulatory Any new allergies or adverse reactions: No Arrival Time: 11:28 Had a fall or experienced change in No Accompanied By: self activities of daily living that may affect Transfer Assistance: None risk of falls: Patient Identification Verified: Yes Signs or symptoms of abuse/neglect since last No Secondary Verification Process Yes visito Completed: Hospitalized since last visit: No Patient Has Alerts: Yes Has Dressing in Place as Prescribed: No Patient Alerts: Patient on Blood Has Compression in Place as Prescribed: No Thinner Pain Present Now: No DMII Electronic Signature(s) Signed: 08/04/2016 9:41:00 AM By: Montey Hora Entered By: Montey Hora on 07/31/2016 11:28:47 [name] [name] ([id number]) -------------------------------------------------------------------------------- Encounter Discharge Information Details Patient Name: [name] [name]. Date of Service: 07/31/2016 11:00 AM Medical Record Number: [id number] Patient Account Number: [id number] Date of Birth/Sex: 21-Apr-1976 (41 y.o. Male) Treating RN: Montey Hora Primary Care Shaquera Ansley: Kasandra Knudsen Other Clinician: Referring Ryane Canavan: Kasandra Knudsen Treating Kashus Karlen/Extender: Frann Rider in Treatment: 8 Encounter Discharge Information Items Discharge Pain Level: 0 Discharge Condition: Stable Ambulatory Status: Ambulatory Discharge Destination:  Home Transportation: Private Auto Accompanied By: self Schedule Follow-up Appointment: Yes Medication Reconciliation completed No and provided to Patient/Care Albertine Lafoy: Patient Clinical Summary of Care: Declined Electronic Signature(s) Signed: 07/31/2016 2:16:51 PM By: Montey Hora Previous Signature: 07/31/2016 12:45:26 PM Version By: Ruthine Dose Entered By: Montey Hora on 07/31/2016 14:16:51 [name] [name] ([id number]) -------------------------------------------------------------------------------- Lower Extremity Assessment Details Patient Name: [name] [name]. Date of Service: 07/31/2016 11:00 AM Medical Record Number: [id number] Patient Account Number: [id number] Date of Birth/Sex: [birth year] (41 y.o. Male) Treating RN: Montey Hora Primary Care Camdyn Laden: Kasandra Knudsen Other Clinician: Referring Tyronn Golda: Kasandra Knudsen Treating Keontay Vora/Extender: Frann Rider in Treatment: 8 Vascular Assessment Pulses: Dorsalis Pedis Palpable: [Left:Yes] Posterior Tibial Extremity colors, hair growth, and conditions: Extremity Color: [Left:Hyperpigmented] Hair Growth on Extremity: [Left:Yes] Temperature of Extremity: [Left:Warm] Capillary Refill: [Left:< 3 seconds] Electronic Signature(s) Signed: 08/04/2016 9:41:00 AM By: Montey Hora Entered By: Montey Hora on 07/31/2016 11:48:38 [name] [name] ([id number]) -------------------------------------------------------------------------------- Multi Wound Chart Details Patient Name: [name] [name]. Date of Service: 07/31/2016 11:00 AM Medical Record Number: [id number] Patient Account Number: [id number] Date of Birth/Sex: [birth year] (41 y.o. Male) Treating RN: Montey Hora Primary Care Ariyannah Pauling: Kasandra Knudsen Other Clinician: Referring Diamante Rubin: Kasandra Knudsen Treating Gae Bihl/Extender: Frann Rider in Treatment: 8 Vital Signs Height(in): 73 Pulse(bpm): 83 Weight(lbs): 285 Blood  Pressure 134/79 (mmHg): Body Mass Index(BMI): 38 Temperature(F): 98.5 Respiratory Rate 18 (breaths/min): Photos: [1:No Photos] [N/A:N/A] Wound Location: [1:Left Lower Leg - Medial] [N/A:N/A] Wounding Event: [1:Blister] [N/A:N/A] Primary Etiology: [1:Diabetic Wound/Ulcer of the Lower Extremity] [N/A:N/A] Secondary Etiology: [1:Venous Leg Ulcer] [N/A:N/A] Comorbid History: [1:Hypertension, Type II Diabetes] [N/A:N/A] Date Acquired: [1:06/03/2016] [N/A:N/A] Weeks of Treatment: [1:8] [N/A:N/A] Wound Status: [1:Open] [N/A:N/A] Measurements L x W x D 6.2x5.6x0.1 [N/A:N/A] (cm) Area (cm) : [1:27.269] [N/A:N/A] Volume (cm) : [1:2.727] [N/A:N/A] % Reduction in Area: [1:62.30%] [N/A:N/A] % Reduction in Volume: 62.30% [N/A:N/A] Classification: [1:Grade 1] [N/A:N/A] Exudate Amount: [1:Large] [N/A:N/A] Exudate Type: [1:Serous] [N/A:N/A] Exudate Color: [1:amber] [N/A:N/A] Wound Margin: [1:Flat and Intact] [N/A:N/A] Granulation Amount: [1:Medium (34-66%)] [N/A:N/A] Granulation  Quality: [1:Red] [N/A:N/A] Necrotic Amount: [1:Medium (34-66%)] [N/A:N/A] Necrotic Tissue: [1:Eschar, Adherent Slough] [N/A:N/A] Exposed Structures: [1:Fascia: No Fat Layer (Subcutaneous Tissue) Exposed: No Tendon: No] [N/A:N/A] Muscle: No Joint: No Bone: No Limited to Skin Breakdown Epithelialization: Small (1-33%) N/A N/A Debridement: Debridement (74081- N/A N/A 11047) Pre-procedure 11:48 N/A N/A Verification/Time Out Taken: Pain Control: Lidocaine 4% Topical N/A N/A Solution Tissue Debrided: Fibrin/Slough, N/A N/A Subcutaneous Level: Skin/Subcutaneous N/A N/A Tissue Debridement Area (sq 34.72 N/A N/A cm): Instrument: Curette N/A N/A Bleeding: Minimum N/A N/A Hemostasis Achieved: Pressure N/A N/A Procedural Pain: 0 N/A N/A Post Procedural Pain: 0 N/A N/A Debridement Treatment Procedure was tolerated N/A N/A Response: well Post Debridement 6.2x5.6x0.1 N/A N/A Measurements L x W x D (cm) Post  Debridement 2.727 N/A N/A Volume: (cm) Periwound Skin Texture: Excoriation: No N/A N/A Induration: No Callus: No Crepitus: No Rash: No Scarring: No Periwound Skin Maceration: No N/A N/A Moisture: Dry/Scaly: No Periwound Skin Color: Erythema: Yes N/A N/A Atrophie Blanche: No Cyanosis: No Ecchymosis: No Hemosiderin Staining: No Mottled: No Pallor: No Rubor: No Erythema Location: Circumferential N/A N/A Temperature: No Abnormality N/A N/A Yes N/A N/A JAYSTIN, MCGARVEY ([id number]) Tenderness on Palpation: Wound Preparation: Ulcer Cleansing: N/A N/A Rinsed/Irrigated with Saline Topical Anesthetic Applied: Other: lidocaine 4% Procedures Performed: Debridement N/A N/A Treatment Notes Electronic Signature(s) Signed: 07/31/2016 12:10:04 PM By: Christin Fudge MD, FACS Entered By: Christin Fudge on 07/31/2016 12:10:04 [name] [name] ([id number]) -------------------------------------------------------------------------------- Maurice Details Patient Name: [name] [name]. Date of Service: 07/31/2016 11:00 AM Medical Record Number: [id number] Patient Account Number: [id number] Date of Birth/Sex: 1976/02/13 (41 y.o. Male) Treating RN: Montey Hora Primary Care Luisfelipe [place]: Kasandra Knudsen Other Clinician: Referring Sharetta Ricchio: Kasandra Knudsen Treating Daziyah Cogan/Extender: Frann Rider in Treatment: 8 Active Inactive ` Orientation to the Wound Care Program Nursing Diagnoses: Knowledge deficit related to the wound healing center program Goals: Patient/caregiver will verbalize understanding of the [place] Program Date Initiated: 06/05/2016 Target Resolution Date: 08/29/2016 Goal Status: Active Interventions: Provide education on orientation to the wound center Notes: ` Wound/Skin Impairment Nursing Diagnoses: Knowledge deficit related to ulceration/compromised skin integrity Goals: Patient/caregiver will verbalize understanding of skin  care regimen Date Initiated: 06/05/2016 Target Resolution Date: 08/29/2016 Goal Status: Active Ulcer/skin breakdown will have a volume reduction of 30% by week 4 Date Initiated: 06/05/2016 Target Resolution Date: 08/29/2016 Goal Status: Active Ulcer/skin breakdown will have a volume reduction of 50% by week 8 Date Initiated: 06/05/2016 Target Resolution Date: 08/29/2016 Goal Status: Active Ulcer/skin breakdown will have a volume reduction of 80% by week 12 Date Initiated: 06/05/2016 Target Resolution Date: 08/29/2016 Goal Status: Active Ulcer/skin breakdown will heal within 14 weeks Date Initiated: 06/05/2016 Target Resolution Date: 08/29/2016 IZIK, BINGMAN ([id number]) Goal Status: Active Interventions: Assess patient/caregiver ability to obtain necessary supplies Assess patient/caregiver ability to perform ulcer/skin care regimen upon admission and as needed Assess ulceration(s) every visit Notes: Electronic Signature(s) Signed: 08/04/2016 9:41:00 AM By: Montey Hora Entered By: Montey Hora on 07/31/2016 11:48:44 [name] [name] ([id number]) -------------------------------------------------------------------------------- Pain Assessment Details Patient Name: [name] [name]. Date of Service: 07/31/2016 11:00 AM Medical Record Number: [id number] Patient Account Number: [id number] Date of Birth/Sex: 11-15-1975 (41 y.o. Male) Treating RN: Montey Hora Primary Care Adler Al[place]: Kasandra Knudsen Other Clinician: Referring Maziyah Vessel: Kasandra Knudsen Treating Jaret Coppedge/Extender: Frann Rider in Treatment: 8 Active Problems Location of Pain Severity and Description of Pain Patient Has Paino No Site Locations Pain Management and Medication Current Pain Management: Notes Topical or injectable lidocaine is offered  to patient for acute pain when surgical debridement is performed. If needed, Patient is instructed to use over the counter pain medication for the following 24-48 hours  after debridement. Wound care MDs do not prescribed pain medications. Patient has chronic pain or uncontrolled pain. Patient has been instructed to make an appointment with their Primary Care Physician for pain management. Electronic Signature(s) Signed: 08/04/2016 9:41:00 AM By: Montey Hora Entered By: Montey Hora on 07/31/2016 11:28:55 [name] [name] ([id number]) -------------------------------------------------------------------------------- Patient/Caregiver Education Details Patient Name: [name] [name]. Date of Service: 07/31/2016 11:00 AM Medical Record Number: [id number] Patient Account Number: [id number] Date of Birth/Gender: 10-08-1975 (41 y.o. Male) Treating RN: Montey Hora Primary Care Physician: Kasandra Knudsen Other Clinician: Referring Physician: Kasandra Knudsen Treating Physician/Extender: Frann Rider in Treatment: 8 Education Assessment Education Provided To: Patient Education Topics Provided Venous: Handouts: Other: please keep wrap on until hhrn visits Methods: Explain/Verbal Responses: State content correctly Electronic Signature(s) Signed: 08/04/2016 9:41:00 AM By: Montey Hora Entered By: Montey Hora on 07/31/2016 14:17:24 [name] [name] ([id number]) -------------------------------------------------------------------------------- Wound Assessment Details Patient Name: [name] [name]. Date of Service: 07/31/2016 11:00 AM Medical Record Number: [id number] Patient Account Number: [id number] Date of Birth/Sex: 12-05-1975 (41 y.o. Male) Treating RN: Montey Hora Primary Care Royalty Domagala: Kasandra Knudsen Other Clinician: Referring Zaahir Pickney: Kasandra Knudsen Treating Akesha Uresti/Extender: Frann Rider in Treatment: 8 Wound Status Wound Number: 1 Primary Etiology: Diabetic Wound/Ulcer of the Lower Extremity Wound Location: Left Lower Leg - Medial Secondary Venous Leg Ulcer Wounding Event: Blister Etiology: Date Acquired:  06/03/2016 Wound Status: Open Weeks Of Treatment: 8 Comorbid Hypertension, Type II Diabetes Clustered Wound: No History: Photos Photo Uploaded By: Montey Hora on 07/31/2016 17:01:42 Wound Measurements Length: (cm) 6.2 Width: (cm) 5.6 Depth: (cm) 0.1 Area: (cm) 27.269 Volume: (cm) 2.727 % Reduction in Area: 62.3% % Reduction in Volume: 62.3% Epithelialization: Small (1-33%) Tunneling: No Undermining: No Wound Description Classification: Grade 1 Foul Odor Afte Wound Margin: Flat and Intact Slough/Fibrino Exudate Amount: Large Exudate Type: Serous Exudate Color: amber r Cleansing: No No Wound Bed Granulation Amount: Medium (34-66%) Exposed Structure Granulation Quality: Red Fascia Exposed: No Necrotic Amount: Medium (34-66%) Fat Layer (Subcutaneous Tissue) Exposed: No Necrotic Quality: Eschar, Adherent Slough Tendon Exposed: No TERIK, HAUGHEY ([id number]) Muscle Exposed: No Joint Exposed: No Bone Exposed: No Limited to Skin Breakdown Periwound Skin Texture Texture Color No Abnormalities Noted: No No Abnormalities Noted: No Callus: No Atrophie Blanche: No Crepitus: No Cyanosis: No Excoriation: No Ecchymosis: No Induration: No Erythema: Yes Rash: No Erythema Location: Circumferential Scarring: No Hemosiderin Staining: No Mottled: No Moisture Pallor: No No Abnormalities Noted: No Rubor: No Dry / Scaly: No Maceration: No Temperature / Pain Temperature: No Abnormality Tenderness on Palpation: Yes Wound Preparation Ulcer Cleansing: Rinsed/Irrigated with Saline Topical Anesthetic Applied: Other: lidocaine 4%, Treatment Notes Wound #1 (Left, Medial Lower Leg) 1. Cleansed with: Clean wound with Normal Saline 2. Anesthetic Topical Lidocaine 4% cream to wound bed prior to debridement 4. Dressing Applied: Aquacel Ag Other dressing (specify in notes) 5. Secondary Dressing Applied ABD Pad 7. Secured with 3 Layer Compression System - Left Lower  Extremity Notes charcoal Electronic Signature(s) Signed: 08/04/2016 9:41:00 AM By: Montey Hora Entered By: Montey Hora on 07/31/2016 11:47:18 [name] [name] ([id number]) -------------------------------------------------------------------------------- Vitals Details Patient Name: [name] [name]. Date of Service: 07/31/2016 11:00 AM Medical Record Number: [id number] Patient Account Number: [id number] Date of Birth/Sex: [birth year] (41 y.o. Male) Treating RN: Montey Hora Primary Care Devarion Mcclanahan: Kasandra Knudsen Other Clinician: Referring Corneluis Allston: Matthew Saras,  CHELSA Treating Britian Jentz/Extender: Frann Rider in Treatment: 8 Vital Signs Time Taken: 11:30 Temperature (F): 98.5 Height (in): 73 Pulse (bpm): 83 Weight (lbs): 285 Respiratory Rate (breaths/min): 18 Body Mass Index (BMI): 37.6 Blood Pressure (mmHg): 134/79 Reference Range: 80 - 120 mg / dl Electronic Signature(s) Signed: 08/04/2016 9:41:00 AM By: Montey Hora Entered By: Montey Hora on 07/31/2016 11:32:00

## 2016-08-07 ENCOUNTER — Encounter: Payer: Medicare Other | Admitting: Surgery

## 2016-08-07 DIAGNOSIS — E11622 Type 2 diabetes mellitus with other skin ulcer: Secondary | ICD-10-CM | POA: Diagnosis not present

## 2016-08-09 NOTE — Progress Notes (Signed)
TOBIN, WITUCKI ([id number]) Visit Report for 08/07/2016 Arrival Information Details Patient Name: [name] [name], [name] [name]. Date of Service: 08/07/2016 12:30 PM Medical Record Number: [id number] Patient Account Number: [id number] Date of Birth/Sex: [birth year] (41 y.o. Male) Treating RN: Montey Hora Primary Care Albaraa Swingle: Kasandra Knudsen Other Clinician: Referring Shloimy Michalski: Kasandra Knudsen Treating Dyshawn Cangelosi/Extender: Frann Rider in Treatment: 9 Visit Information History Since Last Visit Added or deleted any medications: No Patient Arrived: Ambulatory Any new allergies or adverse reactions: No Arrival Time: 12:40 Had a fall or experienced change in No Accompanied By: mother activities of daily living that may affect Transfer Assistance: Manual risk of falls: Patient Identification Verified: Yes Signs or symptoms of abuse/neglect since last No Secondary Verification Process Yes visito Completed: Hospitalized since last visit: No Patient Has Alerts: Yes Has Dressing in Place as Prescribed: Yes Patient Alerts: Patient on Blood Pain Present Now: No Thinner DMII Electronic Signature(s) Signed: 08/07/2016 4:55:58 PM By: Montey Hora Entered By: Montey Hora on 08/07/2016 13:03:31 [name] [name] ([id number]) -------------------------------------------------------------------------------- Encounter Discharge Information Details Patient Name: [name] [name]. Date of Service: 08/07/2016 12:30 PM Medical Record Number: [id number] Patient Account Number: [id number] Date of Birth/Sex: 1976-01-19 (41 y.o. Male) Treating RN: Montey Hora Primary Care Renel Ende: Kasandra Knudsen Other Clinician: Referring Dartagnan Beavers: Kasandra Knudsen Treating Mylena Sedberry/Extender: Frann Rider in Treatment: 9 Encounter Discharge Information Items Discharge Pain Level: 0 Discharge Condition: Stable Ambulatory Status: Ambulatory Discharge Destination: Home Transportation: Private  Auto Accompanied By: mother Schedule Follow-up Appointment: Yes Medication Reconciliation completed and provided to Patient/Care No Narvel Kozub: Provided on Clinical Summary of Care: 08/07/2016 Form Type Recipient Paper Patient LT Electronic Signature(s) Signed: 08/07/2016 1:26:13 PM By: Ruthine Dose Entered By: Ruthine Dose on 08/07/2016 13:26:13 [name] [name] ([id number]) -------------------------------------------------------------------------------- Lower Extremity Assessment Details Patient Name: [name] [name]. Date of Service: 08/07/2016 12:30 PM Medical Record Number: [id number] Patient Account Number: [id number] Date of Birth/Sex: [birth year] (41 y.o. Male) Treating RN: Montey Hora Primary Care Kaliana Albino: Kasandra Knudsen Other Clinician: Referring Selby Slovacek: Kasandra Knudsen Treating Psalm Arman/Extender: Frann Rider in Treatment: 9 Vascular Assessment Pulses: Dorsalis Pedis Palpable: [Left:Yes] Posterior Tibial Extremity colors, hair growth, and conditions: Extremity Color: [Left:Hyperpigmented] Hair Growth on Extremity: [Left:Yes] Temperature of Extremity: [Left:Warm] Capillary Refill: [Left:< 3 seconds] Electronic Signature(s) Signed: 08/07/2016 4:55:58 PM By: Montey Hora Entered By: Montey Hora on 08/07/2016 13:01:57 [name] [name] ([id number]) -------------------------------------------------------------------------------- Multi Wound Chart Details Patient Name: [name] [name]. Date of Service: 08/07/2016 12:30 PM Medical Record Number: [id number] Patient Account Number: [id number] Date of Birth/Sex: 06-13-1975 (41 y.o. 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 N/A N/A Comorbid History: Hypertension, Type II N/A N/A Diabetes Date Acquired: 06/03/2016 N/A N/A Weeks of Treatment: 9 N/A N/A Wound Status: Open N/A N/A Measurements L x W x D 5.9x5x0.1 N/A N/A (cm) Area (cm) : 23.169 N/A N/A Volume (cm) : 2.317 N/A N/A % Reduction in Area: 67.90% N/A N/A % Reduction in Volume: 67.90% N/A N/A Classification: Grade 1 N/A N/A Exudate Amount: Large N/A N/A Exudate Type: Serous N/A N/A Exudate Color: amber N/A N/A Wound Margin: Flat and Intact N/A  N/A Granulation Amount: Medium (34-66%) N/A N/A Granulation Quality: Red N/A N/A YACOB, [name] [name] ([id number]) Necrotic Amount: Medium (34-66%) N/A N/A Necrotic Tissue: Eschar, Adherent Slough N/A N/A Exposed Structures: Fascia: No N/A N/A Fat Layer (Subcutaneous Tissue) Exposed: No Tendon: No Muscle: No Joint: No Bone: No Limited to Skin Breakdown Epithelialization: Small (1-33%) N/A N/A Debridement: Debridement ([id number]- N/A N/A 11047) Pre-procedure 13:03 N/A N/A Verification/Time Out Taken: Pain Control: Lidocaine 4% Topical N/A N/A Solution Tissue Debrided: Necrotic/Eschar, N/A N/A Fibrin/Slough, Other, Subcutaneous Level: Skin/Subcutaneous N/A N/A Tissue Debridement Area (sq 29.5 N/A N/A cm): Instrument: Curette N/A N/A Bleeding: Minimum N/A N/A Hemostasis Achieved: Pressure N/A N/A Procedural Pain: 0 N/A N/A Post Procedural Pain: 0 N/A N/A Debridement Treatment Procedure was tolerated N/A N/A Response: well Post Debridement 5.9x5x0.1 N/A N/A Measurements L x W x D (cm) Post Debridement 2.317 N/A N/A Volume: (cm) Periwound Skin Texture: Excoriation: No N/A N/A Induration: No Callus: No Crepitus:  No Rash: No Scarring: No Periwound Skin Maceration: No N/A N/A Moisture: Dry/Scaly: No Periwound Skin Color: Erythema: Yes N/A N/A Atrophie Blanche: No Cyanosis: No Ecchymosis: No [name] [name], [name] [name] ([id number]) Hemosiderin Staining: No Mottled: No Pallor: No Rubor: No Erythema Location: Circumferential N/A N/A Temperature: No Abnormality N/A N/A Tenderness on Yes N/A N/A Palpation: Wound Preparation: Ulcer Cleansing: N/A N/A Rinsed/Irrigated with Saline Topical Anesthetic Applied: Other: lidocaine 4% Procedures Performed: Debridement N/A N/A Treatment Notes Wound #1 (Left, Medial Lower Leg) 1. Cleansed with: Clean wound with Normal Saline 2. Anesthetic Topical Lidocaine 4% cream to wound bed prior to debridement 4. Dressing Applied: Aquacel Ag Other dressing (specify in notes) 5. Secondary Dressing Applied Dry Gauze 7. Secured with 3 Layer Compression System - Left Lower Extremity Notes charcoal Electronic Signature(s) Signed: 08/07/2016 1:59:47 PM By: Christin Fudge MD, FACS Entered By: Christin Fudge on 08/07/2016 13:59:47 [name] [name] ([id number]) -------------------------------------------------------------------------------- [place] Details Patient Name: [name] [name]. Date of Service: 08/07/2016 12:30 PM Medical Record Number: [id number] Patient Account Number: [id number] Date of Birth/Sex: 1975-12-21 (41 y.o. Male) Treating RN: Montey Hora Primary Care Reita Shindler: Kasandra Knudsen Other Clinician: Referring Tonji Elliff: Kasandra Knudsen Treating Lean Jaeger/Extender: Frann Rider in Treatment: 9 Active Inactive ` Orientation to the Wound Care Program Nursing Diagnoses: Knowledge deficit related to the wound healing center program Goals: Patient/caregiver will verbalize understanding of the [facility] Date Initiated: 06/05/2016 Target Resolution Date: 08/29/2016 Goal Status: Active Interventions: Provide  education on orientation to the wound center Notes: ` Wound/Skin Impairment Nursing Diagnoses: Knowledge deficit related to ulceration/compromised skin integrity Goals: Patient/caregiver will verbalize understanding of skin care regimen Date Initiated: 06/05/2016 Target Resolution Date: 08/29/2016 Goal Status: Active Ulcer/skin breakdown will have a volume reduction of 30% by week 4 Date Initiated: 06/05/2016 Target Resolution Date: 08/29/2016 Goal Status: Active Ulcer/skin breakdown will have a volume reduction of 50% by week 8 Date Initiated: 06/05/2016 Target Resolution Date: 08/29/2016 Goal Status: Active Ulcer/skin breakdown will have a volume reduction of 80% by week 12 Date Initiated: 06/05/2016 Target Resolution Date: 08/29/2016 Goal Status: Active Ulcer/skin breakdown will heal within 14 weeks Date Initiated: 06/05/2016 Target Resolution Date: 08/29/2016 [name] [name], [name] [name] ([id number]) Goal Status: Active Interventions: Assess patient/caregiver ability to obtain necessary supplies Assess patient/caregiver ability to perform ulcer/skin care regimen upon admission and as needed Assess ulceration(s) every visit Notes: Electronic Signature(s) Signed: 08/07/2016 4:55:58 PM By: Montey Hora Entered By: Montey Hora on 08/07/2016 13:02:04 [name] [name] ([id number]) -------------------------------------------------------------------------------- Pain Assessment Details Patient Name: [name] [name]. Date  of Service: 08/07/2016 12:30 PM Medical Record Number: [id number] Patient Account Number: [id number] Date of Birth/Sex: 01-Dec-1975 (41 y.o. Male) Treating RN: Montey Hora Primary Care Taegen Delker: Kasandra Knudsen Other Clinician: Referring Tristin Vandeusen: Kasandra Knudsen Treating Skylor Hughson/Extender: Frann Rider in Treatment: 9 Active Problems Location of Pain Severity and Description of Pain Patient Has Paino No Site Locations Pain Management and Medication Current Pain  Management: Notes Topical or injectable lidocaine is offered to patient for acute pain when surgical debridement is performed. If needed, Patient is instructed to use over the counter pain medication for the following 24-48 hours after debridement. Wound care MDs do not prescribed pain medications. Patient has chronic pain or uncontrolled pain. Patient has been instructed to make an appointment with their Primary Care Physician for pain management. Electronic Signature(s) Signed: 08/07/2016 4:55:58 PM By: Montey Hora Entered By: Montey Hora on 08/07/2016 12:42:05 [name] [name] ([id number]) -------------------------------------------------------------------------------- Patient/Caregiver Education Details Patient Name: [name] [name]. Date of Service: 08/07/2016 12:30 PM Medical Record Number: [id number] Patient Account Number: [id number] Date of Birth/Gender: Mar 26, 1976 (41 y.o. Male) Treating RN: Montey Hora Primary Care Physician: Kasandra Knudsen Other Clinician: Referring Physician: Kasandra Knudsen Treating Physician/Extender: Frann Rider in Treatment: 9 Education Assessment Education Provided To: Patient Education Topics Provided Wound/Skin Impairment: Handouts: Other: wound care as ordered Methods: Demonstration, Explain/Verbal Responses: State content correctly Electronic Signature(s) Signed: 08/07/2016 4:55:58 PM By: Montey Hora Entered By: Montey Hora on 08/07/2016 13:02:54 [name] [name] ([id number]) -------------------------------------------------------------------------------- Wound Assessment Details Patient Name: [name] [name]. Date of Service: 08/07/2016 12:30 PM Medical Record Number: [id number] Patient Account Number: [id number] Date of Birth/Sex: 03-08-1976 (41 y.o. Male) Treating RN: Montey Hora Primary Care Shamel Germond: Kasandra Knudsen Other Clinician: Referring Chihiro Frey: Kasandra Knudsen Treating Yakira Duquette/Extender: Frann Rider in Treatment: 9 Wound Status Wound Number: 1 Primary Etiology: Diabetic Wound/Ulcer of the Lower Extremity Wound Location: Left Lower Leg - Medial Secondary Venous Leg Ulcer Wounding Event: Blister Etiology: Date Acquired: 06/03/2016 Wound Status: Open Weeks Of Treatment: 9 Comorbid Hypertension, Type II Diabetes Clustered Wound: No History: Photos Photo Uploaded By: Montey Hora on 08/07/2016 13:36:13 Wound Measurements Length: (cm) 5.9 Width: (cm) 5 Depth: (cm) 0.1 Area: (cm) 23.169 Volume: (cm) 2.317 % Reduction in Area: 67.9% % Reduction in Volume: 67.9% Epithelialization: Small (1-33%) Tunneling: No Undermining: No Wound Description Classification: Grade 1 Foul Odor Afte Wound Margin: Flat and Intact Slough/Fibrino Exudate Amount: Large Exudate Type: Serous Exudate Color: amber r Cleansing: No No Wound Bed Granulation Amount: Medium (34-66%) Exposed Structure Granulation Quality: Red Fascia Exposed: No Necrotic Amount: Medium (34-66%) Fat Layer (Subcutaneous Tissue) Exposed: No Necrotic Quality: Eschar, Adherent Slough Tendon Exposed: No [name] [name], [name] [name] ([id number]) Muscle Exposed: No Joint Exposed: No Bone Exposed: No Limited to Skin Breakdown Periwound Skin Texture Texture Color No Abnormalities Noted: No No Abnormalities Noted: No Callus: No Atrophie Blanche: No Crepitus: No Cyanosis: No Excoriation: No Ecchymosis: No Induration: No Erythema: Yes Rash: No Erythema Location: Circumferential Scarring: No Hemosiderin Staining: No Mottled: No Moisture Pallor: No No Abnormalities Noted: No Rubor: No Dry / Scaly: No Maceration: No Temperature / Pain Temperature: No Abnormality Tenderness on Palpation: Yes Wound Preparation Ulcer Cleansing: Rinsed/Irrigated with Saline Topical Anesthetic Applied: Other: lidocaine 4%, Treatment Notes Wound #1 (Left, Medial Lower Leg) 1. Cleansed with: Clean wound with Normal Saline 2.  Anesthetic Topical Lidocaine 4% cream to wound bed prior to debridement 4. Dressing Applied: Aquacel Ag Other dressing (specify in notes) 5. Secondary Dressing Applied Dry Gauze 7. Secured with 3  Layer Compression System - Left Lower Extremity Notes charcoal Electronic Signature(s) Signed: 08/07/2016 4:55:58 PM By: Montey Hora Entered By: Montey Hora on 08/07/2016 12:49:41 [name] [name] ([id number]) -------------------------------------------------------------------------------- Vitals Details Patient Name: [name] [name]. Date of Service: 08/07/2016 12:30 PM Medical Record Number: [id number] Patient Account Number: [id number] Date of Birth/Sex: 21-May-1975 (41 y.o. Male) Treating RN: Montey Hora Primary Care Ruhani Umland: Kasandra Knudsen Other Clinician: Referring Aarika Moon: Kasandra Knudsen Treating Nkosi Cortright/Extender: Frann Rider in Treatment: 9 Vital Signs Time Taken: 12:42 Temperature (F): 98.3 Height (in): 73 Pulse (bpm): 85 Weight (lbs): 285 Respiratory Rate (breaths/min): 18 Body Mass Index (BMI): 37.6 Blood Pressure (mmHg): 142/72 Reference Range: 80 - 120 mg / dl Electronic Signature(s) Signed: 08/07/2016 4:55:58 PM By: Montey Hora Entered By: Montey Hora on 08/07/2016 12:43:40

## 2016-08-09 NOTE — Progress Notes (Signed)
CHANDLER, SWIDERSKI ([id number]) Visit Report for 08/07/2016 Chief Complaint Document Details Patient Name: [name] [name], [name] [name]. Date of Service: 08/07/2016 12:30 PM Medical Record Number: [id number] Patient Account Number: [id number] Date of Birth/Sex: 26-Nov-1975 (41 y.o. Male) Treating RN: Montey Hora Primary Care Provider: Kasandra Knudsen Other Clinician: Referring Provider: Kasandra Knudsen Treating Provider/Extender: Frann Rider in Treatment: 9 Information Obtained from: Patient Chief Complaint Patients presents for treatment of an open diabetic ulcer the left lower extremity for 5 days Electronic Signature(s) Signed: 08/07/2016 2:00:02 PM By: Christin Fudge MD, FACS Entered By: Christin Fudge on 08/07/2016 14:00:02 [name] [name] ([id number]) -------------------------------------------------------------------------------- Debridement Details Patient Name: [name] [name]. Date of Service: 08/07/2016 12:30 PM Medical Record Number: [id number] Patient Account Number: [id number] Date of Birth/Sex: [birth year] (41 y.o. Male) Treating RN: Montey Hora Primary Care Provider: Kasandra Knudsen Other Clinician: Referring Provider: Kasandra Knudsen Treating Provider/Extender: Frann Rider in Treatment: 9 Debridement Performed for Wound #1 Left,Medial Lower Leg Assessment: Performed By: Physician Christin Fudge, MD Debridement: Debridement Pre-procedure Yes - 13:03 Verification/Time Out Taken: Start Time: 13:03 Pain Control: Lidocaine 4% Topical Solution Level: Skin/Subcutaneous Tissue Total Area Debrided (L x 5.9 (cm) x 5 (cm) = 29.5 (cm) W): Tissue and other Viable, Non-Viable, Eschar, Fibrin/Slough, Other, Subcutaneous material debrided: Instrument: Curette Bleeding: Minimum Hemostasis Achieved: Pressure End Time: 13:10 Procedural Pain: 0 Post Procedural Pain: 0 Response to Treatment: Procedure was tolerated well Post Debridement Measurements of Total  Wound Length: (cm) 5.9 Width: (cm) 5 Depth: (cm) 0.1 Volume: (cm) 2.317 Character of Wound/Ulcer Post Improved Debridement: Severity of Tissue Post Debridement: Fat layer exposed Post Procedure Diagnosis Same as Pre-procedure Electronic Signature(s) Signed: 08/07/2016 1:59:56 PM By: Christin Fudge MD, FACS Signed: 08/07/2016 4:55:58 PM By: Montey Hora Entered By: Christin Fudge on 08/07/2016 13:59:56 [name] [name] ([id number]) [name] [name], [name] D. ([id number]) -------------------------------------------------------------------------------- HPI Details Patient Name: [name] [name]. Date of Service: 08/07/2016 12:30 PM Medical Record Number: [id number] Patient Account Number: [id number] Date of Birth/Sex: Apr 25, 1976 (41 y.o. Male) Treating RN: Montey Hora Primary Care Provider: Kasandra Knudsen Other Clinician: Referring Provider: Kasandra Knudsen Treating Provider/Extender: Frann Rider in Treatment: 9 History of Present Illness Location: left lower extremity Quality: Patient reports experiencing a dull pain to affected area(s). Severity: Patient states wound (s) are getting better. Duration: Patient has had the wound for < 2 weeks prior to presenting for treatment Timing: Pain in wound is Intermittent (comes and goes Context: The wound appeared gradually over time Modifying Factors: Other treatment(s) tried include:local care and oral antibiotic Associated Signs and Symptoms: Patient reports having increase swelling. HPI Description: 41 year old gentleman who has noticed a blister on his left lower extremity which came up 5 days ago and it has drained fluid. He was seen by hispractitioner who reviewed him and put him on doxycycline now asked him to come to the wound center. His past medical history is significant for diabetes mellitus type 2, congestive heart failure, renal failure, hypertension, and he is receiving hemodialysis 3 times a week. He has never been a  smoker.Marland Kitchen 06/12/2016 -- the patient has in continue with hemodialysis and no changes have occurred in the last week. 07/03/2016 -- the patient has been doing his dressings only every other day and has not been washing was scrubbing the wound as advised 07/10/2016 -- the patient did not schedule his venous duplex study for reflux. He is agreeable about getting a wound biopsy done today. 07/24/2016 -- pathology report -- DIAGNOSIS: A. SKIN WOUND, MEDIAL LEFT LOWER LEG;  BIOPSY: - SKIN WITH CHANGES CONSISTENT WITH STASIS DERMATITIS AND CHRONIC INFLAMMATION. - NEGATIVE FOR MALIGNANCY -- venous study done on 07/22/2016 shows venous incompetence in the bilateral great saphenous vein as well as the deep venous system. No evidence of deep or superficial venous thrombosis in bilateral lower extremity. 07/31/2016 -- the patient has a appointment to see the surgeons at [place] next month 08/07/2016 -- for various reasons the patient could not have his dressing changed 3 times a week and he left his compression on for 2 days and removed it and has been doing his own local care as decided by him. The patient is noncompliant. Electronic Signature(s) Signed: 08/07/2016 2:01:37 PM By: Christin Fudge MD, FACS Previous Signature: 08/07/2016 2:00:06 PM Version By: Christin Fudge MD, FACS Entered By: Christin Fudge on 08/07/2016 14:01:36 [name] [name] ([id number]) -------------------------------------------------------------------------------- Physical Exam Details Patient Name: [name] [name]. Date of Service: 08/07/2016 12:30 PM Medical Record Number: [id number] Patient Account Number: [id number] Date of Birth/Sex: [birth year] (41 y.o. Male) Treating RN: Montey Hora Primary Care Provider: Kasandra Knudsen Other Clinician: Referring Provider: Kasandra Knudsen Treating Provider/Extender: Frann Rider in Treatment: 9 Constitutional . Pulse regular. Respirations normal and unlabored. Afebrile.  . Eyes Nonicteric. Reactive to light. Ears, Nose, Mouth, and Throat Lips, teeth, and gums WNL.Marland Kitchen Moist mucosa without lesions. Neck supple and nontender. No palpable supraclavicular or cervical adenopathy. Normal sized without goiter. Respiratory WNL. No retractions.. Cardiovascular Pedal Pulses WNL. No clubbing, cyanosis or edema. Chest Breasts symmetical and no nipple discharge.. Breast tissue WNL, no masses, lumps, or tenderness.. Lymphatic No adneopathy. No adenopathy. No adenopathy. Musculoskeletal Adexa without tenderness or enlargement.. Digits and nails w/o clubbing, cyanosis, infection, petechiae, ischemia, or inflammatory conditions.. Integumentary (Hair, Skin) No suspicious lesions. No crepitus or fluctuance. No peri-wound warmth or erythema. No masses.Marland Kitchen Psychiatric Judgement and insight Intact.. No evidence of depression, anxiety, or agitation.. Notes subcutaneous debridement was removed sharply with a #3 curet and bleeding controlled with pressure Electronic Signature(s) Signed: 08/07/2016 2:00:45 PM By: Christin Fudge MD, FACS Entered By: Christin Fudge on 08/07/2016 14:00:45 [name] [name] ([id number]) -------------------------------------------------------------------------------- Physician Orders Details Patient Name: [name] [name]. Date of Service: 08/07/2016 12:30 PM Medical Record Number: [id number] Patient Account Number: [id number] Date of Birth/Sex: February 10, 1976 (41 y.o. Male) Treating RN: Montey Hora Primary Care Provider: Kasandra Knudsen Other Clinician: Referring Provider: Kasandra Knudsen Treating Provider/Extender: Frann Rider in Treatment: 9 Verbal / Phone Orders: No Diagnosis Coding Wound Cleansing Wound #1 Left,Medial Lower Leg o Clean wound with Normal Saline. Anesthetic Wound #1 Left,Medial Lower Leg o Topical Lidocaine 4% cream applied to wound bed prior to debridement Primary Wound Dressing Wound #1 Left,Medial Lower  Leg o Aquacel Ag Secondary Dressing Wound #1 Left,Medial Lower Leg o ABD pad o Other - charcoal Dressing Change Frequency Wound #1 Left,Medial Lower Leg o Other: - twice weekly - once at [facility] and once by [facility] Follow-up Appointments Wound #1 Left,Medial Lower Leg o Return Appointment in 1 week. Edema Control Wound #1 Left,Medial Lower Leg o 3 Layer Compression System - Left Lower Extremity Electronic Signature(s) Signed: 08/07/2016 2:17:24 PM By: Christin Fudge MD, FACS Signed: 08/07/2016 4:55:58 PM By: Montey Hora Entered By: Montey Hora on 08/07/2016 13:09:46 [name] [name] ([id number]) [name] [name], [name] [name] ([id number]) -------------------------------------------------------------------------------- Problem List Details Patient Name: [name] [name], [name] [name]. Date of Service: 08/07/2016 12:30 PM Medical Record Number: [id number] Patient Account Number: [id number] Date of Birth/Sex: 1975/08/28 (41 y.o. 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 Date of Service: 08/07/2016 12:30 PM Medical Record Number: [id number] Patient Account  Number: [id number] Date of Birth/Sex: [birth year] (42 y.o. Male) Treating RN: Montey Hora Primary Care Provider: Kasandra Knudsen Other Clinician: Referring Provider: Kasandra Knudsen Treating Provider/Extender: Frann Rider in Treatment: 9 Subjective Chief Complaint Information obtained from Patient Patients presents for treatment of an open diabetic ulcer the left lower extremity for 5 days History of Present Illness (HPI) The following HPI elements were documented for the patient's wound: Location: left lower extremity Quality: Patient reports experiencing a dull pain to affected area(s). Severity: Patient states wound (s) are getting better. Duration: Patient has had the wound for < 2 weeks prior to presenting for treatment Timing: Pain in wound is Intermittent (comes and goes Context: The wound appeared gradually over time Modifying Factors: Other treatment(s) tried include:local care and oral antibiotic Associated Signs and Symptoms: Patient reports having increase swelling. 41 year old gentleman who has noticed a blister on his left lower extremity which came up 5 days ago and it has drained fluid. He was seen by hispractitioner who reviewed him and put him on doxycycline now asked him to come to the wound center. His past medical history is significant for diabetes mellitus type 2, congestive heart failure, renal failure, hypertension, and he is receiving hemodialysis 3 times a week. He has never been a smoker.Marland Kitchen 06/12/2016 -- the patient has in continue with hemodialysis and no changes have occurred in the last week. 07/03/2016 -- the patient has been doing his dressings only every other day and has not been washing was scrubbing the wound as advised 07/10/2016 -- the patient did not schedule his venous duplex study for reflux. He is agreeable about getting a wound biopsy done today. 07/24/2016 -- pathology report -- DIAGNOSIS: A. SKIN WOUND, MEDIAL LEFT LOWER LEG;  BIOPSY: - SKIN WITH CHANGES CONSISTENT WITH STASIS DERMATITIS AND CHRONIC INFLAMMATION. - NEGATIVE FOR MALIGNANCY -- venous study done on 07/22/2016 shows venous incompetence in the bilateral great saphenous vein as well as the deep venous system. No evidence of deep or superficial venous thrombosis in bilateral lower extremity. 07/31/2016 -- the patient has a appointment to see the surgeons at [facility] next month 08/07/2016 -- for various reasons the patient could not have his dressing changed 3 times a week and he left his compression on for 2 days and removed it and has been doing his own local care as decided by him. The patient is noncompliant. [name] [name], [name] D. ([id number]) Objective Constitutional Pulse regular. Respirations normal and unlabored. Afebrile. Vitals Time Taken: 12:42 PM, Height: 73 in, Weight: 285 lbs, BMI: 37.6, Temperature: 98.3 F, Pulse: 85 bpm, Respiratory Rate: 18 breaths/min, Blood Pressure: 142/72 mmHg. Eyes Nonicteric. Reactive to light. Ears, Nose, Mouth, and Throat Lips, teeth, and gums WNL.Marland Kitchen Moist mucosa without lesions. Neck supple and nontender. No palpable supraclavicular or cervical adenopathy. Normal sized without goiter. Respiratory  WNL. No retractions.. Cardiovascular Pedal Pulses WNL. No clubbing, cyanosis or edema. Chest Breasts symmetical and no nipple discharge.. Breast tissue WNL, no masses, lumps, or tenderness.. Lymphatic No adneopathy. No adenopathy. No adenopathy. Musculoskeletal Adexa without tenderness or enlargement.. Digits and nails w/o clubbing, cyanosis, infection, petechiae, ischemia, or inflammatory conditions.Marland Kitchen Psychiatric Judgement and insight Intact.. No evidence of depression, anxiety, or agitation.. General Notes: subcutaneous debridement was removed sharply with a #3 curet and bleeding controlled with pressure Integumentary (Hair, Skin) No suspicious lesions. No crepitus or fluctuance. No peri-wound warmth or erythema.  No masses.. Wound #1 status is Open. Original cause of wound was Blister. The wound is located on the Left,Medial Lower Leg. The wound measures 5.9cm length x 5cm width x 0.1cm depth; 23.169cm^2 area and 2.317cm^3 volume. The wound is limited to skin breakdown. There is no tunneling or undermining noted. [name] [name], [name] D. ([id number]) There is a large amount of serous drainage noted. The wound margin is flat and intact. There is medium (34-66%) red granulation within the wound bed. There is a medium (34-66%) amount of necrotic tissue within the wound bed including Eschar and Adherent Slough. The periwound skin appearance exhibited: Erythema. The periwound skin appearance did not exhibit: Callus, Crepitus, Excoriation, Induration, Rash, Scarring, Dry/Scaly, Maceration, Atrophie Blanche, Cyanosis, Ecchymosis, Hemosiderin Staining, Mottled, Pallor, Rubor. The surrounding wound skin color is noted with erythema which is circumferential. Periwound temperature was noted as No Abnormality. The periwound has tenderness on palpation. 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 since home health has not been a possibility I have recommended: 1. Silver alginate and a 3 layer Profore wrap to be changed twice weekly. Since home health is not available he can come for a nurse visit. 2. we will also order him some juxta light compression stockings in case he is unable to keep the wraps on. 3. Elevation and exercise 4. Good control of his diabetes mellitus 5. earlier he had told me that he would like to see the vascular team at [facility] but  today he says that he will be seeing [facility] next month 6. Review next week in the wound center Procedures Wound #1 Wound #1 is a Diabetic Wound/Ulcer of the Lower Extremity located on the Left,Medial Lower Leg . There was a Skin/Subcutaneous Tissue Debridement ([id number]) debridement with total area of 29.5 sq cm performed by Christin Fudge, MD. with the following instrument(s): Curette to remove Viable and Non-Viable tissue/material including Fibrin/Slough, Eschar, Other, and Subcutaneous after achieving pain control using OLUWASEYI, RAFFEL. ([id number]) Lidocaine 4% Topical Solution. A time out was conducted at 13:03, prior to the start of the procedure. A Minimum amount of bleeding was controlled with Pressure. The procedure was tolerated well with a pain level of 0 throughout and a pain level of 0 following the procedure. Post Debridement Measurements: 5.9cm length x 5cm width x 0.1cm depth; 2.317cm^3 volume. Character of Wound/Ulcer Post Debridement is improved. Severity of Tissue Post Debridement is: Fat layer exposed. Post procedure Diagnosis Wound #1: Same as Pre-Procedure Plan Wound Cleansing: Wound #1 Left,Medial Lower Leg: Clean wound with Normal Saline. Anesthetic: Wound #1 Left,Medial Lower Leg: Topical  Lidocaine 4% cream applied to wound bed prior to debridement Primary Wound Dressing: Wound #1 Left,Medial Lower Leg: Aquacel Ag Secondary Dressing: Wound #1 Left,Medial Lower Leg: ABD pad Other - charcoal Dressing Change Frequency: Wound #1 Left,Medial Lower Leg: Other: - twice weekly - once at [facility] and once by [facility] Follow-up Appointments: Wound #1 Left,Medial Lower Leg: Return Appointment in 1 week. Edema Control: Wound #1 Left,Medial Lower Leg: 3 Layer Compression System - Left Lower Extremity The patient's mother is at the bedside today and she seems to be a reasonable person and will probably be helpful in getting this organized. since home  health has not been a possibility I have recommended: 1. Silver alginate and a 3 layer Profore wrap to be changed twice weekly. Since home health is not available he can come for a nurse visit. [name] [name], [name] D. ([id number]) 2. we will also order him some juxta light compression stockings in case he is unable to keep the wraps on. 3. Elevation and exercise 4. Good control of his diabetes mellitus 5. earlier he had told me that he would like to see the vascular team at [facility], but today he says that he will be seeing [facility] next month 6. Review next week in the wound center Electronic Signature(s) Signed: 08/07/2016 2:03:59 PM By: Christin Fudge MD, FACS Entered By: Christin Fudge on 08/07/2016 14:03:58 [name] [name] ([id number]) -------------------------------------------------------------------------------- SuperBill Details Patient Name: [name] [name]. Date of Service: 08/07/2016 Medical Record Number: [id number] Patient Account Number: [id number] Date of Birth/Sex: 04/24/1976 (41 y.o. 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 Facility Procedures CPT4 Code: [id number] Description: 65035 - DEB SUBQ TISSUE 20 SQ CM/< ICD-10 Description Diagnosis E11.622 Type 2 diabetes mellitus with other skin ulcer Z99.2 Dependence on renal dialysis L97.222 Non-pressure chronic ulcer of left calf with fat l I83.022 Varicose veins of  left lower extremity with ulcer Modifier: ayer exposed of calf Quantity: 1 CPT4 Code: [id number] Description: 51700 - DEB SUBQ TISS EA ADDL 20CM  ICD-10 Description Diagnosis E11.622 Type 2 diabetes mellitus with other skin ulcer L97.222 Non-pressure chronic ulcer of left calf with fat l I83.022 Varicose veins of left lower extremity with ulcer Modifier: ayer exposed of calf Quantity: 1 Physician Procedures CPT4 Code: [id number] Joseph Art Description: 11042 - WC PHYS SUBQ TISS 20 SQ CM ICD-10 Description Diagnosis E11.622 Type 2 diabetes mellitus with other skin ulcer Z99.2 Dependence on renal dialysis L97.222 Non-pressure chronic ulcer of left calf with fat la I83.022 Varicose veins of  left lower extremity with ulcer o E D. ([id number]) Modifier: yer exposed f calf Quantity: 1 Electronic Signature(s) Signed: 08/07/2016 2:04:16 PM By: Christin Fudge MD, FACS Entered By: Christin Fudge on 08/07/2016 14:04:16

## 2016-08-12 DIAGNOSIS — E11622 Type 2 diabetes mellitus with other skin ulcer: Secondary | ICD-10-CM | POA: Diagnosis not present

## 2016-08-13 NOTE — Progress Notes (Signed)
[name] [name], [name] [name] ([id number]) Visit Report for 08/12/2016 Arrival Information Details Patient Name: [name] [name], [name] [name]. Date of Service: 08/12/2016 8:45 AM Medical Record Patient Account Number: [id number] Number: Treating RN: Baruch Gouty, RN, BSN, Rita 03/06/1976 [phone] y.o. Other Clinician: Date of Birth/Sex: Male) Treating ROBSON, MICHAEL Primary Care Finnick Orosz: Kasandra Knudsen Earmon Sherrow/Extender: G Referring Palmyra Rogacki: Kasandra Knudsen Weeks in Treatment: 9 Visit Information History Since Last Visit All ordered tests and consults were completed: No Patient Arrived: Ambulatory Added or deleted any medications: No Arrival Time: 08:56 Any new allergies or adverse reactions: No Accompanied By: self Had a fall or experienced change in No Transfer Assistance: None activities of daily living that may affect Patient Identification Verified: Yes risk of falls: Secondary Verification Process Yes Signs or symptoms of abuse/neglect since last No Completed: visito Patient Has Alerts: Yes Hospitalized since last visit: No Patient Alerts: Patient on Blood Has Dressing in Place as Prescribed: Yes Thinner Has Compression in Place as Prescribed: Yes Pain Present Now: No Electronic Signature(s) Signed: 08/12/2016 4:51:03 PM By: Regan Lemming BSN, RN Entered By: Regan Lemming on 08/12/2016 08:56:51 [name] [name] ([id number]) -------------------------------------------------------------------------------- Encounter Discharge Information Details Patient Name: [name] [name]. Date of Service: 08/12/2016 8:45 AM Medical Record Patient Account Number: [id number] Number: Treating RN: Baruch Gouty, RN, BSN, Rita 1976/01/23 [phone] y.o. Other Clinician: Date of Birth/Sex: Male) Treating ROBSON, MICHAEL Primary Care Kerrigan Gombos: Kasandra Knudsen Harnoor Kohles/Extender: G Referring Tera Pellicane: Kasandra Knudsen Weeks in Treatment: 9 Encounter Discharge Information Items Discharge Pain Level: 0 Discharge  Condition: Stable Ambulatory Status: Ambulatory Discharge Destination: Home Private Transportation: Auto Accompanied By: self Schedule Follow-up Appointment: No Medication Reconciliation completed and No provided to Patient/Care Kniyah Khun: Clinical Summary of Care: Electronic Signature(s) Signed: 08/12/2016 4:51:03 PM By: Regan Lemming BSN, RN Entered By: Regan Lemming on 08/12/2016 09:11:53 [name] [name] ([id number]) -------------------------------------------------------------------------------- Patient/Caregiver Education Details Patient Name: [name] [name]. Date of Service: 08/12/2016 8:45 AM Medical Record Patient Account Number: [id number] Number: Treating RN: Baruch Gouty, RN, BSN, Rita Feb 15, 1976 [phone] y.o. Other Clinician: Date of Birth/Gender: Male) Treating ROBSON, MICHAEL Primary Care Physician: HOLLAND, [place]: G Referring Physician: Kasandra Knudsen Weeks in Treatment: 9 Education Assessment Education Provided To: Patient Education Topics Provided Basic Hygiene: Methods: Explain/Verbal Responses: State content correctly Welcome To [facility] [place]: Methods: Explain/Verbal Responses: State content correctly Electronic Signature(s) Signed: 08/12/2016 4:51:03 PM By: Regan Lemming BSN, RN Entered By: Regan Lemming on 08/12/2016 09:11:38

## 2016-08-14 ENCOUNTER — Encounter: Payer: Medicare Other | Admitting: Surgery

## 2016-08-14 DIAGNOSIS — E11622 Type 2 diabetes mellitus with other skin ulcer: Secondary | ICD-10-CM | POA: Diagnosis not present

## 2016-08-15 NOTE — Progress Notes (Signed)
NASSER, KU ([id number]) Visit Report for 08/14/2016 Arrival Information Details Patient Name: [name] [name], [name] [name]. Date of Service: 08/14/2016 8:45 AM Medical Record Number: [id number] Patient Account Number: [id number] Date of Birth/Sex: July 26, 1975 (41 y.o. Male) Treating RN: Ahmed Prima Primary Care Ashante Snelling: Kasandra Knudsen Other Clinician: Referring Deondra Wigger: Kasandra Knudsen Treating Reginold Beale/Extender: Frann Rider in Treatment: 10 Visit Information History Since Last Visit All ordered tests and consults were completed: No Patient Arrived: Ambulatory Added or deleted any medications: No Arrival Time: 08:53 Any new allergies or adverse reactions: No Accompanied By: self Had a fall or experienced change in No Transfer Assistance: None activities of daily living that may affect Patient Identification Verified: Yes risk of falls: Secondary Verification Process Yes Signs or symptoms of abuse/neglect since last No Completed: visito Patient Requires Transmission- No Hospitalized since last visit: No Based Precautions: Has Dressing in Place as Prescribed: Yes Patient Has Alerts: Yes Has Compression in Place as Prescribed: Yes Patient Alerts: Patient on Blood Pain Present Now: No Thinner Electronic Signature(s) Signed: 08/14/2016 4:17:25 PM By: Alric Quan Entered By: Alric Quan on 08/14/2016 08:54:11 [name] [name] ([id number]) -------------------------------------------------------------------------------- Encounter Discharge Information Details Patient Name: [name] [name]. Date of Service: 08/14/2016 8:45 AM Medical Record Number: [id number] Patient Account Number: [id number] Date of Birth/Sex: Dec 19, 1975 (41 y.o. Male) Treating RN: Ahmed Prima Primary Care Jaeveon Ashland: Kasandra Knudsen Other Clinician: Referring Musa Rewerts: Kasandra Knudsen Treating Merrilyn Legler/Extender: Frann Rider in Treatment: 10 Encounter Discharge Information  Items Discharge Pain Level: 0 Discharge Condition: Stable Ambulatory Status: Ambulatory Discharge Destination: Home Transportation: Private Auto Accompanied By: self Schedule Follow-up Appointment: Yes Medication Reconciliation completed and provided to Patient/Care No Zyen Triggs: Provided on Clinical Summary of Care: 08/14/2016 Form Type Recipient Paper Patient LT Electronic Signature(s) Signed: 08/14/2016 9:24:02 AM By: Ruthine Dose Entered By: Ruthine Dose on 08/14/2016 09:24:02 [name] [name] ([id number]) -------------------------------------------------------------------------------- Lower Extremity Assessment Details Patient Name: [name] [name]. Date of Service: 08/14/2016 8:45 AM Medical Record Number: [id number] Patient Account Number: [id number] Date of Birth/Sex: 06/12/1975 (41 y.o. Male) Treating RN: Ahmed Prima Primary Care Jassen Sarver: Kasandra Knudsen Other Clinician: Referring Trudie Cervantes: Kasandra Knudsen Treating Maynard David/Extender: Frann Rider in Treatment: 10 Vascular Assessment Pulses: Dorsalis Pedis Palpable: [Left:Yes] Posterior Tibial Extremity colors, hair growth, and conditions: Extremity Color: [Left:Hyperpigmented] Temperature of Extremity: [Left:Warm] Capillary Refill: [Left:< 3 seconds] Electronic Signature(s) Signed: 08/14/2016 4:17:25 PM By: Alric Quan Entered By: Alric Quan on 08/14/2016 09:04:06 [name] [name] ([id number]) -------------------------------------------------------------------------------- Multi Wound Chart Details Patient Name: [name] [name]. Date of Service: 08/14/2016 8:45 AM Medical Record Number: [id number] Patient Account Number: [id number] Date of Birth/Sex: 1976/03/10 (41 y.o. Male) Treating RN: Ahmed Prima Primary Care Melinda Gwinner: Kasandra Knudsen Other Clinician: Referring Rylin Saez: Kasandra Knudsen Treating Jaqwan Wieber/Extender: Frann Rider in Treatment: 10 Vital Signs Height(in):  73 Pulse(bpm): 79 Weight(lbs): 285 Blood Pressure 129/75 (mmHg): Body Mass Index(BMI): 38 Temperature(F): 98.2 Respiratory Rate 18 (breaths/min): Photos: [1:No Photos] [N/A:N/A] Wound Location: [1:Left Lower Leg - Medial] [N/A:N/A] Wounding Event: [1:Blister] [N/A:N/A] Primary Etiology: [1:Diabetic Wound/Ulcer of the Lower Extremity] [N/A:N/A] Secondary Etiology: [1:Venous Leg Ulcer] [N/A:N/A] Comorbid History: [1:Hypertension, Type II Diabetes] [N/A:N/A] Date Acquired: [1:06/03/2016] [N/A:N/A] Weeks of Treatment: [1:10] [N/A:N/A] Wound Status: [1:Open] [N/A:N/A] Measurements L x W x D 4.3x2.8x0.1 [N/A:N/A] (cm) Area (cm) : [1:9.456] [N/A:N/A] Volume (cm) : [1:0.946] [N/A:N/A] % Reduction in Area: [1:86.90%] [N/A:N/A] % Reduction in Volume: 86.90% [N/A:N/A] Classification: [1:Grade 1] [N/A:N/A] Exudate Amount: [1:Large] [N/A:N/A] Exudate Type: [1:Serosanguineous] [N/A:N/A] Exudate Color: [1:red, brown] [N/A:N/A] Wound Margin: [1:Flat and Intact] [  N/A:N/A] Granulation Amount: [1:Medium (34-66%)] [N/A:N/A] Granulation Quality: [1:Red] [N/A:N/A] Necrotic Amount: [1:Medium (34-66%)] [N/A:N/A] Necrotic Tissue: [1:Eschar, Adherent Slough] [N/A:N/A] Exposed Structures: [1:Fascia: No Fat Layer (Subcutaneous Tissue) Exposed: No Tendon: No] [N/A:N/A] Muscle: No Joint: No Bone: No Limited to Skin Breakdown Epithelialization: Small (1-33%) N/A N/A Debridement: Debridement (01749- N/A N/A 11047) Pre-procedure 09:08 N/A N/A Verification/Time Out Taken: Pain Control: Lidocaine 4% Topical N/A N/A Solution Tissue Debrided: Fibrin/Slough, Exudates, N/A N/A Subcutaneous Level: Skin/Subcutaneous N/A N/A Tissue Debridement Area (sq 12.04 N/A N/A cm): Instrument: Curette N/A N/A Bleeding: Minimum N/A N/A Hemostasis Achieved: Pressure N/A N/A Procedural Pain: 0 N/A N/A Post Procedural Pain: 0 N/A N/A Debridement Treatment Procedure was tolerated N/A N/A Response: well Post  Debridement 4.5x2.8x0.1 N/A N/A Measurements L x W x D (cm) Post Debridement 0.99 N/A N/A Volume: (cm) Periwound Skin Texture: Excoriation: No N/A N/A Induration: No Callus: No Crepitus: No Rash: No Scarring: No Periwound Skin Maceration: No N/A N/A Moisture: Dry/Scaly: No Periwound Skin Color: Erythema: Yes N/A N/A Atrophie Blanche: No Cyanosis: No Ecchymosis: No Hemosiderin Staining: No Mottled: No Pallor: No Rubor: No Erythema Location: Circumferential N/A N/A Temperature: No Abnormality N/A N/A Yes N/A N/A [name] [name], [name] [name] ([id number]) Tenderness on Palpation: Wound Preparation: Ulcer Cleansing: N/A N/A Rinsed/Irrigated with Saline Topical Anesthetic Applied: Other: lidocaine 4% Procedures Performed: Debridement N/A N/A Treatment Notes Wound #1 (Left, Medial Lower Leg) 1. Cleansed with: Clean wound with Normal Saline Cleanse wound with antibacterial soap and water 2. Anesthetic Topical Lidocaine 4% cream to wound bed prior to debridement 4. Dressing Applied: Aquacel Ag 5. Secondary Dressing Applied ABD Pad 7. Secured with Tape 3 Layer Compression System - Left Lower Extremity Electronic Signature(s) Signed: 08/14/2016 9:21:56 AM By: Christin Fudge MD, FACS Entered By: Christin Fudge on 08/14/2016 09:21:56 [name] [name] ([id number]) -------------------------------------------------------------------------------- [place] Details Patient Name: [name] [name]. Date of Service: 08/14/2016 8:45 AM Medical Record Number: [id number] Patient Account Number: [id number] Date of Birth/Sex: 08-24-1975 (41 y.o. Male) Treating RN: Ahmed Prima Primary Care Staisha Winiarski: Kasandra Knudsen Other Clinician: Referring Esdras Delair: Kasandra Knudsen Treating Latana Colin/Extender: Frann Rider in Treatment: 10 Active Inactive ` Orientation to the Wound Care Program Nursing Diagnoses: Knowledge deficit related to the wound healing center  program Goals: Patient/caregiver will verbalize understanding of the [facility] Date Initiated: 06/05/2016 Target Resolution Date: 08/29/2016 Goal Status: Active Interventions: Provide education on orientation to the wound center Notes: ` Wound/Skin Impairment Nursing Diagnoses: Knowledge deficit related to ulceration/compromised skin integrity Goals: Patient/caregiver will verbalize understanding of skin care regimen Date Initiated: 06/05/2016 Target Resolution Date: 08/29/2016 Goal Status: Active Ulcer/skin breakdown will have a volume reduction of 30% by week 4 Date Initiated: 06/05/2016 Target Resolution Date: 08/29/2016 Goal Status: Active Ulcer/skin breakdown will have a volume reduction of 50% by week 8 Date Initiated: 06/05/2016 Target Resolution Date: 08/29/2016 Goal Status: Active Ulcer/skin breakdown will have a volume reduction of 80% by week 12 Date Initiated: 06/05/2016 Target Resolution Date: 08/29/2016 Goal Status: Active Ulcer/skin breakdown will heal within 14 weeks Date Initiated: 06/05/2016 Target Resolution Date: 08/29/2016 [name] [name], [name] [name] ([id number]) Goal Status: Active Interventions: Assess patient/caregiver ability to obtain necessary supplies Assess patient/caregiver ability to perform ulcer/skin care regimen upon admission and as needed Assess ulceration(s) every visit Notes: Electronic Signature(s) Signed: 08/14/2016 4:17:25 PM By: Alric Quan Entered By: Alric Quan on 08/14/2016 09:23:11 [name] [name] ([id number]) -------------------------------------------------------------------------------- Pain Assessment Details Patient Name: [name] [name]. Date of Service: 08/14/2016 8:45 AM Medical Record Number: [id number]  Patient Account Number: [id number] Date of Birth/Sex: [birth year] (41 y.o. Male) Treating RN: Carolyne Fiscal, Debi Primary Care Tonita Bills: Kasandra Knudsen Other Clinician: Referring Jlon Betker: Kasandra Knudsen Treating  Finnley Larusso/Extender: Frann Rider in Treatment: 10 Active Problems Location of Pain Severity and Description of Pain Patient Has Paino No Site Locations With Dressing Change: No Pain Management and Medication Current Pain Management: Electronic Signature(s) Signed: 08/14/2016 4:17:25 PM By: Alric Quan Entered By: Alric Quan on 08/14/2016 08:54:17 [name] [name] ([id number]) -------------------------------------------------------------------------------- Patient/Caregiver Education Details Patient Name: [name] [name]. Date of Service: 08/14/2016 8:45 AM Medical Record Number: [id number] Patient Account Number: [id number] Date of Birth/Gender: [birth year] (41 y.o. Male) Treating RN: Ahmed Prima Primary Care Physician: Kasandra Knudsen Other Clinician: Referring Physician: Kasandra Knudsen Treating Physician/Extender: Frann Rider in Treatment: 10 Education Assessment Education Provided To: Patient Education Topics Provided Wound/Skin Impairment: Handouts: Other: keep wrap clean and dry Methods: Demonstration, Explain/Verbal Responses: State content correctly Electronic Signature(s) Signed: 08/14/2016 4:17:25 PM By: Alric Quan Entered By: Alric Quan on 08/14/2016 09:14:50 [name] [name] ([id number]) -------------------------------------------------------------------------------- Wound Assessment Details Patient Name: [name] [name]. Date of Service: 08/14/2016 8:45 AM Medical Record Number: [id number] Patient Account Number: [id number] Date of Birth/Sex: 07/07/1975 (41 y.o. Male) Treating RN: Ahmed Prima Primary Care Nyja Westbrook: Kasandra Knudsen Other Clinician: Referring Lovelee Forner: Kasandra Knudsen Treating Kavya Haag/Extender: Frann Rider in Treatment: 10 Wound Status Wound Number: 1 Primary Etiology: Diabetic Wound/Ulcer of the Lower Extremity Wound Location: Left Lower Leg - Medial Secondary Venous Leg  Ulcer Wounding Event: Blister Etiology: Date Acquired: 06/03/2016 Wound Status: Open Weeks Of Treatment: 10 Comorbid Hypertension, Type II Diabetes Clustered Wound: No History: Photos Photo Uploaded By: Alric Quan on 08/14/2016 12:38:19 Wound Measurements Length: (cm) 4.3 Width: (cm) 2.8 Depth: (cm) 0.1 Area: (cm) 9.456 Volume: (cm) 0.946 % Reduction in Area: 86.9% % Reduction in Volume: 86.9% Epithelialization: Small (1-33%) Tunneling: No Undermining: No Wound Description Classification: Grade 1 Foul Odor Afte Wound Margin: Flat and Intact Slough/Fibrino Exudate Amount: Large Exudate Type: Serosanguineous Exudate Color: red, brown r Cleansing: No No Wound Bed Granulation Amount: Medium (34-66%) Exposed Structure Granulation Quality: Red Fascia Exposed: No Necrotic Amount: Medium (34-66%) Fat Layer (Subcutaneous Tissue) Exposed: No Necrotic Quality: Eschar, Adherent Slough Tendon Exposed: No [name] [name], [name] [name] ([id number]) Muscle Exposed: No Joint Exposed: No Bone Exposed: No Limited to Skin Breakdown Periwound Skin Texture Texture Color No Abnormalities Noted: No No Abnormalities Noted: No Callus: No Atrophie Blanche: No Crepitus: No Cyanosis: No Excoriation: No Ecchymosis: No Induration: No Erythema: Yes Rash: No Erythema Location: Circumferential Scarring: No Hemosiderin Staining: No Mottled: No Moisture Pallor: No No Abnormalities Noted: No Rubor: No Dry / Scaly: No Maceration: No Temperature / Pain Temperature: No Abnormality Tenderness on Palpation: Yes Wound Preparation Ulcer Cleansing: Rinsed/Irrigated with Saline Topical Anesthetic Applied: Other: lidocaine 4%, Treatment Notes Wound #1 (Left, Medial Lower Leg) 1. Cleansed with: Clean wound with Normal Saline Cleanse wound with antibacterial soap and water 2. Anesthetic Topical Lidocaine 4% cream to wound bed prior to debridement 4. Dressing Applied: Aquacel Ag 5.  Secondary Dressing Applied ABD Pad 7. Secured with Tape 3 Layer Compression System - Left Lower Extremity Electronic Signature(s) Signed: 08/14/2016 4:17:25 PM By: Alric Quan Entered By: Alric Quan on 08/14/2016 09:03:44 [name] [name] ([id number]) -------------------------------------------------------------------------------- Vitals Details Patient Name: [name] [name]. Date of Service: 08/14/2016 8:45 AM Medical Record Number: [id number] Patient Account Number: [id number] Date of Birth/Sex: 1975/05/23 (41 y.o. Male) Treating RN: Ahmed Prima Primary Care Lorne Winkels: Kasandra Knudsen Other Clinician:  Referring Anvith Mauriello: Kasandra Knudsen Treating Darneisha Windhorst/Extender: Frann Rider in Treatment: 10 Vital Signs Time Taken: 08:54 Temperature (F): 98.2 Height (in): 73 Pulse (bpm): 79 Weight (lbs): 285 Respiratory Rate (breaths/min): 18 Body Mass Index (BMI): 37.6 Blood Pressure (mmHg): 129/75 Reference Range: 80 - 120 mg / dl Electronic Signature(s) Signed: 08/14/2016 4:17:25 PM By: Alric Quan Entered By: Alric Quan on 08/14/2016 08:55:55

## 2016-08-15 NOTE — Progress Notes (Signed)
JEP, DYAS ([id number]) Visit Report for 08/14/2016 Chief Complaint Document Details Patient Name: [name] [name], [name] [name]. Date of Service: 08/14/2016 8:45 AM Medical Record Number: [id number] Patient Account Number: [id number] Date of Birth/Sex: 1975-07-16 (41 y.o. Male) Treating RN: Ahmed Prima Primary Care Provider: Kasandra Knudsen Other Clinician: Referring Provider: Kasandra Knudsen Treating Provider/Extender: Frann Rider in Treatment: 10 Information Obtained from: Patient Chief Complaint Patients presents for treatment of an open diabetic ulcer the left lower extremity for 5 days Electronic Signature(s) Signed: 08/14/2016 9:26:48 AM By: Christin Fudge MD, FACS Entered By: Christin Fudge on 08/14/2016 09:26:47 [name] [name] ([id number]) -------------------------------------------------------------------------------- Debridement Details Patient Name: [name] [name]. Date of Service: 08/14/2016 8:45 AM Medical Record Number: [id number] Patient Account Number: [id number] Date of Birth/Sex: Nov 11, 1975 (41 y.o. Male) Treating RN: Carolyne Fiscal, Debi Primary Care Provider: Kasandra Knudsen Other Clinician: Referring Provider: Kasandra Knudsen Treating Provider/Extender: Frann Rider in Treatment: 10 Debridement Performed for Wound #1 Left,Medial Lower Leg Assessment: Performed By: Physician Christin Fudge, MD Debridement: Debridement Pre-procedure Yes - 09:08 Verification/Time Out Taken: Start Time: 09:09 Pain Control: Lidocaine 4% Topical Solution Level: Skin/Subcutaneous Tissue Total Area Debrided (L x 4.3 (cm) x 2.8 (cm) = 12.04 (cm) W): Tissue and other Viable, Non-Viable, Exudate, Fibrin/Slough, Subcutaneous material debrided: Instrument: Curette Bleeding: Minimum Hemostasis Achieved: Pressure End Time: 09:15 Procedural Pain: 0 Post Procedural Pain: 0 Response to Treatment: Procedure was tolerated well Post Debridement Measurements of Total  Wound Length: (cm) 4.5 Width: (cm) 2.8 Depth: (cm) 0.1 Volume: (cm) 0.99 Character of Wound/Ulcer Post Requires Further Debridement Debridement: Severity of Tissue Post Debridement: Fat layer exposed Post Procedure Diagnosis Same as Pre-procedure Electronic Signature(s) Signed: 08/14/2016 9:22:05 AM By: Christin Fudge MD, FACS Signed: 08/14/2016 4:17:25 PM By: Alric Quan Entered By: Christin Fudge on 08/14/2016 09:22:05 [name] [name] ([id number]) [name] [name] ([id number]) -------------------------------------------------------------------------------- HPI Details Patient Name: [name] [name]. Date of Service: 08/14/2016 8:45 AM Medical Record Number: [id number] Patient Account Number: [id number] Date of Birth/Sex: June 07, 1975 (41 y.o. Male) Treating RN: Ahmed Prima Primary Care Provider: Kasandra Knudsen Other Clinician: Referring Provider: Kasandra Knudsen Treating Provider/Extender: Frann Rider in Treatment: 10 History of Present Illness Location: left lower extremity Quality: Patient reports experiencing a dull pain to affected area(s). Severity: Patient states wound (s) are getting better. Duration: Patient has had the wound for < 2 weeks prior to presenting for treatment Timing: Pain in wound is Intermittent (comes and goes Context: The wound appeared gradually over time Modifying Factors: Other treatment(s) tried include:local care and oral antibiotic Associated Signs and Symptoms: Patient reports having increase swelling. HPI Description: 41 year old gentleman who has noticed a blister on his left lower extremity which came up 5 days ago and it has drained fluid. He was seen by hispractitioner who reviewed him and put him on doxycycline now asked him to come to the wound center. His past medical history is significant for diabetes mellitus type 2, congestive heart failure, renal failure, hypertension, and he is receiving hemodialysis 3 times a  week. He has never been a smoker.Marland Kitchen 06/12/2016 -- the patient has in continue with hemodialysis and no changes have occurred in the last week. 07/03/2016 -- the patient has been doing his dressings only every other day and has not been washing was scrubbing the wound as advised 07/10/2016 -- the patient did not schedule his venous duplex study for reflux. He is agreeable about getting a wound biopsy done today. 07/24/2016 -- pathology report -- DIAGNOSIS: A. SKIN WOUND, MEDIAL LEFT LOWER  LEG; BIOPSY: - SKIN WITH CHANGES CONSISTENT WITH STASIS DERMATITIS AND CHRONIC INFLAMMATION. - NEGATIVE FOR MALIGNANCY -- venous study done on 07/22/2016 shows venous incompetence in the bilateral great saphenous vein as well as the deep venous system. No evidence of deep or superficial venous thrombosis in bilateral lower extremity. 07/31/2016 -- the patient has a appointment to see the surgeons at [place] next month 08/07/2016 -- for various reasons the patient could not have his dressing changed 3 times a week and he left his compression on for 2 days and removed it and has been doing his own local care as decided by him. The patient is noncompliant. Electronic Signature(s) Signed: 08/14/2016 9:26:58 AM By: Christin Fudge MD, FACS Entered By: Christin Fudge on 08/14/2016 09:26:58 [name] [name] ([id number]) -------------------------------------------------------------------------------- Physical Exam Details Patient Name: [name] [name]. Date of Service: 08/14/2016 8:45 AM Medical Record Number: [id number] Patient Account Number: [id number] Date of Birth/Sex: [birth year] (40 y.o. Male) Treating RN: Ahmed Prima Primary Care Provider: Kasandra Knudsen Other Clinician: Referring Provider: Kasandra Knudsen Treating Provider/Extender: Frann Rider in Treatment: 10 Constitutional . Pulse regular. Respirations normal and unlabored. Afebrile. . Eyes Nonicteric. Reactive to light. Ears, Nose,  Mouth, and Throat Lips, teeth, and gums WNL.Marland Kitchen Moist mucosa without lesions. Neck supple and nontender. No palpable supraclavicular or cervical adenopathy. Normal sized without goiter. Respiratory WNL. No retractions.. Breath sounds WNL, No rubs, rales, rhonchi, or wheeze.. Cardiovascular Heart rhythm and rate regular, no murmur or gallop.. Pedal Pulses WNL. No clubbing, cyanosis or edema. Chest Breasts symmetical and no nipple discharge.. Breast tissue WNL, no masses, lumps, or tenderness.. Lymphatic No adneopathy. No adenopathy. No adenopathy. Musculoskeletal Adexa without tenderness or enlargement.. Digits and nails w/o clubbing, cyanosis, infection, petechiae, ischemia, or inflammatory conditions.. Integumentary (Hair, Skin) No suspicious lesions. No crepitus or fluctuance. No peri-wound warmth or erythema. No masses.Marland Kitchen Psychiatric Judgement and insight Intact.. No evidence of depression, anxiety, or agitation.. Notes wound is looking much better with some healthy epithelialization. Several areas had eschar and subcutaneous debris which I sharply removed with a #3 curet and bleeding controlled with pressure. Electronic Signature(s) Signed: 08/14/2016 9:27:58 AM By: Christin Fudge MD, FACS Entered By: Christin Fudge on 08/14/2016 09:27:58 [name] [name] ([id number]) -------------------------------------------------------------------------------- Physician Orders Details Patient Name: [name] [name]. Date of Service: 08/14/2016 8:45 AM Medical Record Number: [id number] Patient Account Number: [id number] Date of Birth/Sex: [birth year] (41 y.o. Male) Treating RN: Ahmed Prima Primary Care Provider: Kasandra Knudsen Other Clinician: Referring Provider: Kasandra Knudsen Treating Provider/Extender: Frann Rider in Treatment: 10 Verbal / Phone Orders: Yes ClinicianCarolyne Fiscal, Debi Read Back and Verified: Yes Diagnosis Coding Wound Cleansing Wound #1 Left,Medial Lower  Leg o Clean wound with Normal Saline. Anesthetic Wound #1 Left,Medial Lower Leg o Topical Lidocaine 4% cream applied to wound bed prior to debridement Primary Wound Dressing Wound #1 Left,Medial Lower Leg o Aquacel Ag Secondary Dressing Wound #1 Left,Medial Lower Leg o ABD pad o Other - charcoal Dressing Change Frequency Wound #1 Left,Medial Lower Leg o Other: - twice weekly - once at [facility] and once by [facility] Follow-up Appointments Wound #1 Left,Medial Lower Leg o Return Appointment in 1 week. Edema Control Wound #1 Left,Medial Lower Leg o 3 Layer Compression System - Left Lower Extremity Electronic Signature(s) Signed: 08/14/2016 4:16:12 PM By: Christin Fudge MD, FACS Entered By: Christin Fudge on 08/14/2016 09:28:10 [name] [name] ([id number]) CYLIS, AYARS ([id number]) -------------------------------------------------------------------------------- Problem List Details Patient Name: [name] [name]. Date of Service: 08/14/2016  8:45 AM Medical Record Number: [id number] Patient Account Number: [id number] Date of Birth/Sex: 09/01/1975 (41 y.o. 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 Date of Service: 08/14/2016 8:45 AM Medical Record Number: [id number] Patient Account Number: [id number] Date of Birth/Sex: [birth year] (41 y.o. Male) Treating RN: Ahmed Prima Primary Care Provider: Kasandra Knudsen Other Clinician: Referring Provider: Kasandra Knudsen Treating Provider/Extender: Frann Rider in Treatment: 10 Subjective Chief Complaint Information obtained from Patient Patients presents for treatment of an open diabetic ulcer the left lower extremity for 5 days History of Present Illness (HPI) The following HPI elements were documented for the patient's wound: Location: left lower extremity Quality: Patient reports experiencing a dull pain to affected area(s). Severity: Patient states wound (s) are getting better. Duration: Patient has had the wound for < 2 weeks prior to presenting for treatment Timing: Pain in wound is Intermittent (comes and goes Context: The wound appeared gradually over time Modifying Factors: Other treatment(s) tried include:local care and oral antibiotic Associated Signs and Symptoms: Patient reports having increase swelling. 41 year old gentleman who has noticed a blister on his left lower extremity which came up 5 days ago and it has drained fluid. He was seen by hispractitioner who reviewed him and put him on doxycycline now asked him to come to the wound center. His past medical history is significant for diabetes mellitus type 2, congestive heart failure, renal failure, hypertension, and he is receiving hemodialysis 3 times a week. He has never been a smoker.Marland Kitchen 06/12/2016 -- the patient has in continue with hemodialysis and no changes have occurred in the last week. 07/03/2016 -- the patient has been doing his dressings only every other day and has not been washing was scrubbing the  wound as advised 07/10/2016 -- the patient did not schedule his venous duplex study for reflux. He is agreeable about getting a wound biopsy done today. 07/24/2016 -- pathology report -- DIAGNOSIS: A. 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 - NEGATIVE FOR MALIGNANCY -- venous study done on 07/22/2016 shows venous incompetence in the bilateral great saphenous vein as well as the deep venous system. No evidence of deep or superficial venous thrombosis in bilateral lower extremity. 07/31/2016 -- the patient has a appointment to see the surgeons at [place] next month 08/07/2016 -- for various reasons the patient could not have his dressing changed 3 times a week and he left his compression on for 2 days and removed it and has been doing his own local care as decided by him. The patient is noncompliant. HASSEN, BRUUN D. ([id number]) Objective Constitutional Pulse regular. Respirations normal and unlabored. Afebrile. Vitals Time Taken: 8:54 AM, Height: 73 in, Weight: 285 lbs, BMI: 37.6, Temperature: 98.2 F, Pulse: 79 bpm, Respiratory Rate: 18 breaths/min, Blood Pressure: 129/75 mmHg. Eyes Nonicteric. Reactive to light. Ears, Nose, Mouth, and Throat  Lips, teeth, and gums WNL.Marland Kitchen Moist mucosa without lesions. Neck supple and nontender. No palpable supraclavicular or cervical adenopathy. Normal sized without goiter. Respiratory WNL. No retractions.. Breath sounds WNL, No rubs, rales, rhonchi, or wheeze.. Cardiovascular Heart rhythm and rate regular, no murmur or gallop.. Pedal Pulses WNL. No clubbing, cyanosis or edema. Chest Breasts symmetical and no nipple discharge.. Breast tissue WNL, no masses, lumps, or tenderness.. Lymphatic No adneopathy. No adenopathy. No adenopathy. Musculoskeletal Adexa without tenderness or enlargement.. Digits and nails w/o clubbing, cyanosis, infection, petechiae, ischemia, or  inflammatory conditions.Marland Kitchen Psychiatric Judgement and insight Intact.. No evidence of depression, anxiety, or agitation.. General Notes: wound is looking much better with some healthy epithelialization. Several areas had eschar and subcutaneous debris which I sharply removed with a #3 curet and bleeding controlled with pressure. Integumentary (Hair, Skin) No suspicious lesions. No crepitus or fluctuance. No peri-wound warmth or erythema. No masses.. Wound #1 status is Open. Original cause of wound was Blister. The wound is located on the Left,Medial Lower Leg. The wound measures 4.3cm length x 2.8cm width x 0.1cm depth; 9.456cm^2 area and 0.946cm^3 volume. The wound is limited to skin breakdown. There is no tunneling or undermining noted. TYREECE, GELLES D. ([id number]) There is a large amount of serosanguineous drainage noted. The wound margin is flat and intact. There is medium (34-66%) red granulation within the wound bed. There is a medium (34-66%) amount of necrotic tissue within the wound bed including Eschar and Adherent Slough. The periwound skin appearance exhibited: Erythema. The periwound skin appearance did not exhibit: Callus, Crepitus, Excoriation, Induration, Rash, Scarring, Dry/Scaly, Maceration, Atrophie Blanche, Cyanosis, Ecchymosis, Hemosiderin Staining, Mottled, Pallor, Rubor. The surrounding wound skin color is noted with erythema which is circumferential. Periwound temperature was noted as No Abnormality. The periwound has tenderness on palpation. 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 There was a Skin/Subcutaneous Tissue  Debridement ([id number]) debridement with total area of 12.04 sq cm performed by Christin Fudge, MD. with the following instrument(s): Curette to remove Viable and Non-Viable tissue/material including Exudate, Fibrin/Slough, and Subcutaneous after achieving pain control using Lidocaine 4% Topical Solution. A time out was conducted at 09:08, prior to the start of the procedure. A Minimum amount of bleeding was controlled with Pressure. The procedure was tolerated well with a pain level of 0 throughout and a pain level of 0 following the procedure. Post Debridement Measurements: 4.5cm length x 2.8cm width x 0.1cm depth; 0.99cm^3 volume. Character of Wound/Ulcer Post Debridement requires further debridement. Severity of Tissue Post Debridement is: Fat layer exposed. Post procedure Diagnosis Wound #1: Same as Pre-Procedure Plan HANDY, MCLOUD ([id number]) Wound Cleansing: Wound #1 Left,Medial Lower Leg: Clean wound with Normal Saline. Anesthetic: Wound #1 Left,Medial Lower Leg: Topical Lidocaine 4% cream applied to wound bed prior to debridement Primary Wound Dressing: Wound #1 Left,Medial Lower Leg: Aquacel Ag Secondary Dressing: Wound #1 Left,Medial Lower Leg: ABD pad Other - charcoal Dressing Change Frequency: Wound #1 Left,Medial Lower Leg: Other: - twice weekly - once at [facility] and once by [facility] Follow-up Appointments: Wound #1 Left,Medial Lower Leg: Return Appointment in 1 week. Edema Control: Wound #1 Left,Medial Lower Leg: 3 Layer  Compression System - Left Lower Extremity I have recommended: 1. Silver alginate and a 3 layer Profore wrap to be changed twice weekly. Since home health is not available he can come for a nurse visit. 2. we will also order him some juxta light compression stockings in case he is unable to keep the wraps on. 3. Elevation and exercise 4. Good control of his diabetes mellitus 5. earlier he had told me that he would like to see the  vascular team at [facility], but today he says that he will be seeing [facility] next month 6. Review next week in the wound center Electronic Signature(s) Signed: 08/14/2016 9:28:54 AM By: Christin Fudge MD, FACS Entered By: Christin Fudge on 08/14/2016 09:28:54 [name] [name] ([id number]) -------------------------------------------------------------------------------- [place] Details Patient Name: [name] [name]. Date of Service: 08/14/2016 Medical Record Number: [id number] Patient Account Number: [id number] Date of Birth/Sex: 1975-12-14 (41 y.o. 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 Facility Procedures CPT4 Code: [id number] Description: 03212 - DEB SUBQ TISSUE 20 SQ CM/< ICD-10 Description Diagnosis E11.622 Type 2 diabetes mellitus with other skin ulcer Z99.2 Dependence on renal dialysis L97.222 Non-pressure chronic ulcer of left calf with fat l I83.022 Varicose veins of  left lower extremity with ulcer Modifier: ayer exposed of calf Quantity: 1 Physician Procedures CPT4 Code: 2482500 Description: 37048 - WC PHYS SUBQ TISS 20 SQ CM ICD-10 Description Diagnosis E11.622 Type 2 diabetes mellitus with other skin ulcer Z99.2 Dependence on renal dialysis L97.222 Non-pressure chronic ulcer of left calf with fat l I83.022 Varicose veins of  left lower extremity with ulcer Modifier: ayer exposed of calf Quantity: 1 Electronic Signature(s) Signed: 08/14/2016 9:29:09 AM By: Christin Fudge MD, FACS Entered By: Christin Fudge on 08/14/2016 09:29:08

## 2016-08-21 ENCOUNTER — Encounter: Payer: Medicare Other | Admitting: Surgery

## 2016-08-21 DIAGNOSIS — E11622 Type 2 diabetes mellitus with other skin ulcer: Secondary | ICD-10-CM | POA: Diagnosis not present

## 2016-08-22 NOTE — Progress Notes (Signed)
[name] [name] ([id number]) Visit Report for 08/21/2016 Arrival Information Details Patient Name: [name] [name], [name] [name]. Date of Service: 08/21/2016 9:15 AM Medical Record Number: [id number] Patient Account Number: [id number] Date of Birth/Sex: 31-Oct-1975 (41 y.o. Male) Treating RN: [name] [name] Primary Care [name] [name]: Kasandra Knudsen Other Clinician: Referring Karn Derk: Kasandra Knudsen Treating [name] [name]/Extender: [name] [name] in Treatment: 11 Visit Information History Since Last Visit Added or deleted any medications: No Patient Arrived: Ambulatory Any new allergies or adverse reactions: No Arrival Time: 09:26 Had a fall or experienced change in No Accompanied By: self activities of daily living that may affect Transfer Assistance: None risk of falls: Patient Identification Verified: Yes Signs or symptoms of abuse/neglect since last No Secondary Verification Process Yes visito Completed: Hospitalized since last visit: No Patient Requires Transmission- No Has Dressing in Place as Prescribed: Yes Based Precautions: Has Compression in Place as Prescribed: No Patient Has Alerts: Yes Pain Present Now: No Patient Alerts: Patient on Blood Thinner Electronic Signature(s) Signed: 08/21/2016 5:14:16 PM By: [name] [name] Entered By: [name] [name] on 08/21/2016 09:28:06 [name] [name] ([id number]) -------------------------------------------------------------------------------- Encounter Discharge Information Details Patient Name: [name] [name]. Date of Service: 08/21/2016 9:15 AM Medical Record Number: [id number] Patient Account Number: [id number] Date of Birth/Sex: [birth year] (41 y.o. Male) Treating RN: [name] [name] Primary Care Donnamarie Shankles: Kasandra Knudsen Other Clinician: Referring [name] [name]: Kasandra Knudsen Treating [name] [name]/Extender: [name] [name] in Treatment: 11 Encounter Discharge Information Items Discharge Pain Level: 0 Discharge Condition:  Stable Ambulatory Status: Ambulatory Discharge Destination: Home Transportation: Private Auto Accompanied By: self Schedule Follow-up Appointment: Yes Medication Reconciliation completed and provided to Patient/Care No Carl Bleecker: Provided on Clinical Summary of Care: 08/21/2016 Form Type Recipient Paper Patient LT Electronic Signature(s) Signed: 08/21/2016 10:04:31 AM By: [name] [name] Entered By: [name] [name] on 08/21/2016 10:04:31 [name] [name] ([id number]) -------------------------------------------------------------------------------- General Visit Notes Details Patient Name: [name] [name]. Date of Service: 08/21/2016 9:15 AM Medical Record Number: [id number] Patient Account Number: [id number] Date of Birth/Sex: 1976-02-03 (41 y.o. Male) Treating RN: [name] [name] Primary Care Kinzly Pierrelouis: Kasandra Knudsen Other Clinician: Referring [name] [name]: Kasandra Knudsen Treating [name] [name]/Extender: [name] [name] in Treatment: 11 Notes CBG log given to patient and patient educated to record [place] Signature(s) Signed: 08/21/2016 5:05:05 PM By: [name] [name] Entered By: [name] [name] on 08/21/2016 17:05:05 [name] [name] ([id number]) -------------------------------------------------------------------------------- Lower Extremity Assessment Details Patient Name: [name] [name]. Date of Service: 08/21/2016 9:15 AM Medical Record Number: [id number] Patient Account Number: [id number] Date of Birth/Sex: 1975/08/09 (41 y.o. Male) Treating RN: [name] [name] Primary Care Jaileen Janelle: Kasandra Knudsen Other Clinician: Referring Olean Sangster: Kasandra Knudsen Treating [name] [name]/Extender: [name] [name] in Treatment: 11 Vascular Assessment Pulses: Dorsalis Pedis Palpable: [Left:Yes] Posterior Tibial Extremity colors, hair growth, and conditions: Extremity Color: [Left:Hyperpigmented] Hair Growth on Extremity: [Left:Yes] Temperature of Extremity: [Left:Warm] Capillary  Refill: [Left:< 3 seconds] Electronic Signature(s) Signed: 08/21/2016 5:14:16 PM By: [name] [name] Entered By: [name] [name] on 08/21/2016 09:36:47 [name] [name] ([id number]) -------------------------------------------------------------------------------- Multi Wound Chart Details Patient Name: [name] [name]. Date of Service: 08/21/2016 9:15 AM Medical Record Number: [id number] Patient Account Number: [id number] Date of Birth/Sex: [birth year] (41 y.o. 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 N/A N/A Comorbid History: Hypertension, Type II N/A N/A Diabetes Date Acquired: 06/03/2016 N/A N/A Weeks of Treatment: 11 N/A N/A Wound Status: Open N/A N/A Measurements L x W x D 2.5x1.5x0.1 N/A N/A (cm) Area (cm) : 2.945 N/A N/A Volume (cm) : 0.295 N/A N/A % Reduction in Area: 95.90% N/A N/A % Reduction in Volume: 95.90% N/A N/A [name] [name] ([id number]) Classification: Grade 1 N/A N/A Exudate Amount: Large N/A N/A Exudate Type: Serosanguineous N/A N/A Exudate Color: red, brown N/A N/A Wound Margin: Flat and Intact N/A N/A Granulation Amount: Medium (34-66%) N/A N/A Granulation Quality: Red N/A N/A Necrotic Amount: Medium (34-66%) N/A N/A Necrotic Tissue: Eschar, Adherent Slough N/A N/A Exposed Structures: Fascia: No N/A N/A Fat Layer (Subcutaneous Tissue) Exposed: No Tendon: No Muscle: No Joint: No Bone: No Limited to  Skin Breakdown Epithelialization: Small (1-33%) N/A N/A Debridement: Debridement ([id number]- N/A N/A 11047) Pre-procedure 09:46 N/A N/A Verification/Time Out Taken: Pain Control: Lidocaine 4% Topical N/A N/A Solution Tissue Debrided: Necrotic/Eschar, N/A N/A Fibrin/Slough, Exudates, Subcutaneous Level: Skin/Subcutaneous N/A N/A Tissue Debridement Area (sq 3.75 N/A N/A cm): Instrument: Curette N/A N/A Bleeding: Minimum N/A N/A Hemostasis Achieved: Pressure N/A N/A Procedural Pain: 0 N/A N/A Post Procedural Pain: 0 N/A N/A Debridement Treatment Procedure was tolerated N/A N/A Response: well Post Debridement 3.4x2.5x0.1 N/A N/A Measurements L x W x D (cm) Post Debridement 0.668 N/A N/A Volume: (cm) Periwound Skin Texture: Excoriation: No N/A N/A Induration: No Callus: No Crepitus: No OTTAVIO, NOREM ([id number]) Rash: No Scarring: No Periwound Skin Maceration: No N/A N/A Moisture: Dry/Scaly: No Periwound Skin Color: Erythema: Yes N/A N/A Atrophie Blanche: No Cyanosis: No Ecchymosis: No Hemosiderin Staining: No Mottled: No Pallor: No Rubor: No Erythema Location: Circumferential N/A N/A Temperature: No Abnormality N/A N/A Tenderness on Yes N/A N/A Palpation: Wound Preparation: Ulcer Cleansing: N/A N/A Rinsed/Irrigated with Saline Topical Anesthetic Applied: Other: lidocaine 4% Procedures Performed: Debridement N/A N/A Treatment Notes Wound #1 (Left, Medial Lower Leg) 1. Cleansed with: Clean wound with Normal Saline 2. Anesthetic Topical Lidocaine 4% cream to wound bed prior to debridement 4. Dressing Applied: Hydrafera Blue 5. Secondary Dressing Applied ABD Pad 7. Secured with Tape Patient to wear own compression stockings Electronic Signature(s) Signed: 08/21/2016 10:23:36 AM By: Christin Fudge MD, FACS Entered By: Christin Fudge on 08/21/2016 10:23:36 [name] [name]  ([id number]) -------------------------------------------------------------------------------- [place] Details Patient Name: [name] [name]. Date of Service: 08/21/2016 9:15 AM Medical Record Number: [id number] Patient Account Number: [id number] Date of Birth/Sex: [birth year] (41 y.o. Male) Treating RN: [name] [name] Primary Care Rahman Ferrall: Kasandra Knudsen Other Clinician: Referring Kyuss Hale: Kasandra Knudsen Treating Wong Steadham/Extender: [name] [name] in Treatment: 11 Active Inactive ` Orientation to the Wound Care Program Nursing Diagnoses: Knowledge deficit related to the wound healing center program Goals: Patient/caregiver will verbalize understanding of the [facility] Date Initiated: 06/05/2016 Target Resolution Date: 08/29/2016 Goal Status: Active Interventions: Provide education on orientation to the wound center Notes: ` Wound/Skin Impairment Nursing Diagnoses: Knowledge deficit related to ulceration/compromised skin integrity Goals: Patient/caregiver will verbalize understanding of skin care regimen Date Initiated: 06/05/2016 Target Resolution Date: 08/29/2016 Goal Status: Active Ulcer/skin breakdown will have a volume reduction of 30% by week 4 Date Initiated: 06/05/2016 Target Resolution Date: 08/29/2016 Goal Status: Active Ulcer/skin breakdown will have a volume reduction of 50% by week 8 Date Initiated: 06/05/2016 Target Resolution Date: 08/29/2016 Goal Status: Active Ulcer/skin breakdown will have a volume reduction of 80%  by week 12 Date Initiated: 06/05/2016 Target Resolution Date: 08/29/2016 Goal Status: Active Ulcer/skin breakdown will heal within 14 weeks Date Initiated: 06/05/2016 Target Resolution Date: 08/29/2016 CALE, BETHARD ([id number]) Goal Status: Active Interventions: Assess patient/caregiver ability to obtain necessary supplies Assess patient/caregiver ability to perform ulcer/skin care regimen upon admission and as  needed Assess ulceration(s) every visit Notes: Electronic Signature(s) Signed: 08/21/2016 5:14:16 PM By: [name] [name] Entered By: [name] [name] on 08/21/2016 09:44:12 [name] [name] ([id number]) -------------------------------------------------------------------------------- Pain Assessment Details Patient Name: [name] [name]. Date of Service: 08/21/2016 9:15 AM Medical Record Number: [id number] Patient Account Number: [id number] Date of Birth/Sex: July 30, 1975 (41 y.o. Male) Treating RN: [name] [name] Primary Care Beonka Amesquita: Kasandra Knudsen Other Clinician: Referring Micaella Gitto: Kasandra Knudsen Treating Elaf Clauson/Extender: [name] [name] in Treatment: 11 Active Problems Location of Pain Severity and Description of Pain Patient Has Paino No Site Locations Pain Management and Medication Current Pain Management: Notes Topical or injectable lidocaine is offered to patient for acute pain when surgical debridement is performed. If needed, Patient is instructed to use over the counter pain medication for the following 24-48 hours after debridement. Wound care MDs do not prescribed pain medications. Patient has chronic pain or uncontrolled pain. Patient has been instructed to make an appointment with their Primary Care Physician for pain management. Electronic Signature(s) Signed: 08/21/2016 5:14:16 PM By: [name] [name] Entered By: [name] [name] on 08/21/2016 09:28:20 [name] [name] ([id number]) -------------------------------------------------------------------------------- Patient/Caregiver Education Details Patient Name: [name] [name]. Date of Service: 08/21/2016 9:15 AM Medical Record Number: [id number] Patient Account Number: [id number] Date of Birth/Gender: 04/11/1976 (41 y.o. Male) Treating RN: [name] [name] Primary Care Physician: Kasandra Knudsen Other Clinician: Referring Physician: Kasandra Knudsen Treating Physician/Extender: [name] [name] in  Treatment: 11 Education Assessment Education Provided To: Patient Education Topics Provided Venous: Handouts: Other: how to apply juxtalite Methods: Explain/Verbal Responses: State content correctly Electronic Signature(s) Signed: 08/21/2016 5:14:16 PM By: [name] [name] Entered By: [name] [name] on 08/21/2016 09:45:39 [name] [name] ([id number]) -------------------------------------------------------------------------------- Wound Assessment Details Patient Name: [name] [name]. Date of Service: 08/21/2016 9:15 AM Medical Record Number: [id number] Patient Account Number: [id number] Date of Birth/Sex: January 16, 1976 (41 y.o. Male) Treating RN: [name] [name] Primary Care Maimouna Rondeau: Kasandra Knudsen Other Clinician: Referring Ryen Heitmeyer: Kasandra Knudsen Treating Taner Rzepka/Extender: [name] [name] in Treatment: 11 Wound Status Wound Number: 1 Primary Etiology: Diabetic Wound/Ulcer of the Lower Extremity Wound Location: Left, Medial Lower Leg Secondary Venous Leg Ulcer Wounding Event: Blister Etiology: Date Acquired: 06/03/2016 Wound Status: Open Weeks Of Treatment: 11 Comorbid Hypertension, Type II Diabetes Clustered Wound: No History: Photos Photo Uploaded By: [name] [name] on 08/21/2016 10:14:31 Wound Measurements Length: (cm) 2.5 Width: (cm) 1.5 Depth: (cm) 0.1 Area: (cm) 2.945 Volume: (cm) 0.295 % Reduction in Area: 95.9% % Reduction in Volume: 95.9% Epithelialization: Small (1-33%) Tunneling: No Undermining: No Wound Description Classification: Grade 1 Foul Odor Afte Wound Margin: Flat and Intact Slough/Fibrino Exudate Amount: Large Exudate Type: Serosanguineous Exudate Color: red, brown r Cleansing: No No Wound Bed Granulation Amount: Medium (34-66%) Exposed Structure Granulation Quality: Red Fascia Exposed: No Necrotic Amount: Medium (34-66%) Fat Layer (Subcutaneous Tissue) Exposed: No Necrotic Quality: Eschar, Adherent Slough Tendon Exposed:  No DUSTON, SMOLENSKI ([id number]) Muscle Exposed: No Joint Exposed: No Bone Exposed: No Limited to Skin Breakdown Periwound Skin Texture Texture Color No Abnormalities Noted: No No Abnormalities Noted: No Callus: No Atrophie Blanche: No Crepitus: No Cyanosis: No Excoriation: No Ecchymosis: No Induration: No Erythema: Yes Rash: No Erythema Location: Circumferential Scarring: No Hemosiderin Staining: No  Mottled: No Moisture Pallor: No No Abnormalities Noted: No Rubor: No Dry / Scaly: No Maceration: No Temperature / Pain Temperature: No Abnormality Tenderness on Palpation: Yes Wound Preparation Ulcer Cleansing: Rinsed/Irrigated with Saline Topical Anesthetic Applied: Other: lidocaine 4%, Treatment Notes Wound #1 (Left, Medial Lower Leg) 1. Cleansed with: Clean wound with Normal Saline 2. Anesthetic Topical Lidocaine 4% cream to wound bed prior to debridement 4. Dressing Applied: Hydrafera Blue 5. Secondary Dressing Applied ABD Pad 7. Secured with Tape Patient to wear own compression stockings Electronic Signature(s) Signed: 08/21/2016 5:14:16 PM By: [name] [name] Entered By: [name] [name] on 08/21/2016 09:51:13 [name] [name] ([id number]) -------------------------------------------------------------------------------- [place] Details Patient Name: [name] [name]. Date of Service: 08/21/2016 9:15 AM Medical Record Number: [id number] Patient Account Number: [id number] Date of Birth/Sex: [birth year] (41 y.o. Male) Treating RN: [name] [name] Primary Care Arihaan Bellucci: Kasandra Knudsen Other Clinician: Referring Kashawna Manzer: Kasandra Knudsen Treating Wilmary Levit/Extender: [name] [name] in Treatment: 11 Vital Signs Time Taken: 09:28 Temperature (F): 98.3 Height (in): 73 Pulse (bpm): 80 Weight (lbs): 285 Respiratory Rate (breaths/min): 18 Body Mass Index (BMI): 37.6 Blood Pressure (mmHg): 117/59 Reference Range: 80 - 120 mg / dl Electronic  Signature(s) Signed: 08/21/2016 5:14:16 PM By: [name] [name] Entered By: [name] [name] on 08/21/2016 09:31:10

## 2016-08-22 NOTE — Progress Notes (Signed)
PHARAOH, PIO ([id number]) Visit Report for 08/21/2016 Chief Complaint Document Details Patient Name: [name] [name], [name] [name]. Date of Service: 08/21/2016 9:15 AM Medical Record Number: [id number] Patient Account Number: [id number] Date of Birth/Sex: 07-02-1975 (41 y.o. Male) Treating RN: Montey Hora Primary Care Provider: Kasandra Knudsen Other Clinician: Referring Provider: Kasandra Knudsen Treating Provider/Extender: Frann Rider in Treatment: 11 Information Obtained from: Patient Chief Complaint Patients presents for treatment of an open diabetic ulcer the left lower extremity for 5 days Electronic Signature(s) Signed: 08/21/2016 10:24:02 AM By: Christin Fudge MD, FACS Entered By: Christin Fudge on 08/21/2016 10:24:01 [name] [name] ([id number]) -------------------------------------------------------------------------------- Debridement Details Patient Name: [name] [name]. Date of Service: 08/21/2016 9:15 AM Medical Record Number: [id number] Patient Account Number: [id number] Date of Birth/Sex: [birth year] (41 y.o. Male) Treating RN: Montey Hora Primary Care Provider: Kasandra Knudsen Other Clinician: Referring Provider: Kasandra Knudsen Treating Provider/Extender: Frann Rider in Treatment: 11 Debridement Performed for Wound #1 Left,Medial Lower Leg Assessment: Performed By: Physician Christin Fudge, MD Debridement: Debridement Pre-procedure Yes - 09:46 Verification/Time Out Taken: Start Time: 09:46 Pain Control: Lidocaine 4% Topical Solution Level: Skin/Subcutaneous Tissue Total Area Debrided (L x 2.5 (cm) x 1.5 (cm) = 3.75 (cm) W): Tissue and other Viable, Non-Viable, Eschar, Exudate, Fibrin/Slough, Subcutaneous material debrided: Instrument: Curette Bleeding: Minimum Hemostasis Achieved: Pressure End Time: 09:49 Procedural Pain: 0 Post Procedural Pain: 0 Response to Treatment: Procedure was tolerated well Post Debridement Measurements of Total  Wound Length: (cm) 3.4 Width: (cm) 2.5 Depth: (cm) 0.1 Volume: (cm) 0.668 Character of Wound/Ulcer Post Improved Debridement: Severity of Tissue Post Debridement: Fat layer exposed Post Procedure Diagnosis Same as Pre-procedure Electronic Signature(s) Signed: 08/21/2016 10:23:48 AM By: Christin Fudge MD, FACS Signed: 08/21/2016 5:14:16 PM By: Montey Hora Entered By: Christin Fudge on 08/21/2016 10:23:47 [name] [name] ([id number]) [name] [name] ([id number]) -------------------------------------------------------------------------------- HPI Details Patient Name: [name] [name]. Date of Service: 08/21/2016 9:15 AM Medical Record Number: [id number] Patient Account Number: [id number] Date of Birth/Sex: 23-Apr-1976 (41 y.o. Male) Treating RN: Montey Hora Primary Care Provider: Kasandra Knudsen Other Clinician: Referring Provider: Kasandra Knudsen Treating Provider/Extender: Frann Rider in Treatment: 11 History of Present Illness Location: left lower extremity Quality: Patient reports experiencing a dull pain to affected area(s). Severity: Patient states wound (s) are getting better. Duration: Patient has had the wound for < 2 weeks prior to presenting for treatment Timing: Pain in wound is Intermittent (comes and goes Context: The wound appeared gradually over time Modifying Factors: Other treatment(s) tried include:local care and oral antibiotic Associated Signs and Symptoms: Patient reports having increase swelling. HPI Description: 41 year old gentleman who has noticed a blister on his left lower extremity which came up 5 days ago and it has drained fluid. He was seen by hispractitioner who reviewed him and put him on doxycycline now asked him to come to the wound center. His past medical history is significant for diabetes mellitus type 2, congestive heart failure, renal failure, hypertension, and he is receiving hemodialysis 3 times a week. He has never been  a smoker.Marland Kitchen 06/12/2016 -- the patient has in continue with hemodialysis and no changes have occurred in the last week. 07/03/2016 -- the patient has been doing his dressings only every other day and has not been washing was scrubbing the wound as advised 07/10/2016 -- the patient did not schedule his venous duplex study for reflux. He is agreeable about getting a wound biopsy done today. 07/24/2016 -- pathology report -- DIAGNOSIS: A. SKIN WOUND, MEDIAL LEFT LOWER LEG;  BIOPSY: - SKIN WITH CHANGES CONSISTENT WITH STASIS DERMATITIS AND CHRONIC INFLAMMATION. - NEGATIVE FOR MALIGNANCY -- venous study done on 07/22/2016 shows venous incompetence in the bilateral great saphenous vein as well as the deep venous system. No evidence of deep or superficial venous thrombosis in bilateral lower extremity. 07/31/2016 -- the patient has a appointment to see the surgeons at [facility] next month 08/07/2016 -- for various reasons the patient could not have his dressing changed 3 times a week and he left his compression on for 2 days and removed it and has been doing his own local care as decided by him. The patient is noncompliant. Electronic Signature(s) Signed: 08/21/2016 10:24:06 AM By: Christin Fudge MD, FACS Entered By: Christin Fudge on 08/21/2016 10:24:06 [name] [name] ([id number]) -------------------------------------------------------------------------------- Physical Exam Details Patient Name: [name] [name]. Date of Service: 08/21/2016 9:15 AM Medical Record Number: [id number] Patient Account Number: [id number] Date of Birth/Sex: 07-13-1975 (41 y.o. Male) Treating RN: Montey Hora Primary Care Provider: Kasandra Knudsen Other Clinician: Referring Provider: Kasandra Knudsen Treating Provider/Extender: Frann Rider in Treatment: 11 Constitutional . Pulse regular. Respirations normal and unlabored. Afebrile. . Eyes Nonicteric. Reactive to light. Ears, Nose, Mouth, and Throat Lips,  teeth, and gums WNL.Marland Kitchen Moist mucosa without lesions. Neck supple and nontender. No palpable supraclavicular or cervical adenopathy. Normal sized without goiter. Respiratory WNL. No retractions.. Breath sounds WNL, No rubs, rales, rhonchi, or wheeze.. Cardiovascular Heart rhythm and rate regular, no murmur or gallop.. Pedal Pulses WNL. No clubbing, cyanosis or edema. Chest Breasts symmetical and no nipple discharge.. Breast tissue WNL, no masses, lumps, or tenderness.. Lymphatic No adneopathy. No adenopathy. No adenopathy. Musculoskeletal Adexa without tenderness or enlargement.. Digits and nails w/o clubbing, cyanosis, infection, petechiae, ischemia, or inflammatory conditions.. Integumentary (Hair, Skin) No suspicious lesions. No crepitus or fluctuance. No peri-wound warmth or erythema. No masses.Marland Kitchen Psychiatric Judgement and insight Intact.. No evidence of depression, anxiety, or agitation.. Notes the wound is improved overall and minimal debridement required to remove subcutaneous debrisocar and the size is much smaller Electronic Signature(s) Signed: 08/21/2016 10:24:32 AM By: Christin Fudge MD, FACS Entered By: Christin Fudge on 08/21/2016 10:24:32 [name] [name] ([id number]) -------------------------------------------------------------------------------- Physician Orders Details Patient Name: [name] [name]. Date of Service: 08/21/2016 9:15 AM Medical Record Number: [id number] Patient Account Number: [id number] Date of Birth/Sex: 1975/05/01 (41 y.o. Male) Treating RN: Montey Hora Primary Care Provider: Kasandra Knudsen Other Clinician: Referring Provider: Kasandra Knudsen Treating Provider/Extender: Frann Rider in Treatment: 11 Verbal / Phone Orders: No Diagnosis Coding Wound Cleansing Wound #1 Left,Medial Lower Leg o Clean wound with Normal Saline. Anesthetic Wound #1 Left,Medial Lower Leg o Topical Lidocaine 4% cream applied to wound bed prior to  debridement Primary Wound Dressing Wound #1 Left,Medial Lower Leg o Hydrafera Blue Secondary Dressing Wound #1 Left,Medial Lower Leg o ABD pad - secure with tape Dressing Change Frequency Wound #1 Left,Medial Lower Leg o Change dressing every other day. Follow-up Appointments Wound #1 Left,Medial Lower Leg o Return Appointment in 1 week. Edema Control Wound #1 Left,Medial Lower Leg o Patient to wear own Juxtalite/Juzo compression garment. - wear at al times during the day and take off at night and elevate your legs at night Electronic Signature(s) Signed: 08/21/2016 4:50:03 PM By: Christin Fudge MD, FACS Signed: 08/21/2016 5:14:16 PM By: Montey Hora Entered By: Montey Hora on 08/21/2016 09:53:04 [name] [name] ([id number]) CAROLINE, MATTERS ([id number]) -------------------------------------------------------------------------------- Problem List Details Patient Name: KAILER, HEINDEL. Date of Service: 08/21/2016 9:15 AM Medical Record  Number: [id number] Patient Account Number: [id number] Date of Birth/Sex: 07/25/1975 (41 y.o. 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 Date of Service: 08/21/2016 9:15 AM Medical Record Number: [id number] Patient Account Number: [id number] Date of Birth/Sex: [birth year] (41 y.o. Male) Treating RN: Montey Hora Primary Care Provider: Kasandra Knudsen Other Clinician: Referring Provider: Kasandra Knudsen Treating Provider/Extender: Frann Rider in Treatment: 11 Subjective Chief Complaint Information obtained from Patient Patients presents for treatment of an open diabetic ulcer the left lower extremity for 5 days History of Present Illness (HPI) The following HPI elements were documented for the patient's wound: Location: left lower extremity Quality: Patient reports experiencing a dull pain to affected area(s). Severity: Patient states wound (s) are getting better. Duration: Patient has had the wound for < 2 weeks prior to presenting for treatment Timing: Pain in wound is Intermittent (comes and goes Context: The wound appeared gradually over time Modifying Factors: Other treatment(s) tried include:local care and oral antibiotic Associated Signs and Symptoms: Patient reports having increase swelling. 41 year old gentleman who has noticed a blister on his left lower extremity which came up 5 days ago and it has drained fluid. He was seen by hispractitioner who reviewed him and put him on doxycycline now asked him to come to the wound center. His past medical history is significant for diabetes mellitus type 2, congestive heart failure, renal failure, hypertension, and he is receiving hemodialysis 3 times a week. He has never been a smoker.Marland Kitchen 06/12/2016 -- the patient has in continue with hemodialysis and no changes have occurred in the last week. 07/03/2016 -- the patient has been doing his dressings only every other day and has not been washing was scrubbing the wound as advised 07/10/2016 -- the patient did not schedule his venous duplex study for reflux. He is agreeable  about getting a wound biopsy done today. 07/24/2016 -- pathology report -- DIAGNOSIS: A. 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 - NEGATIVE FOR MALIGNANCY -- venous study done on 07/22/2016 shows venous incompetence in the bilateral great saphenous vein as well as the deep venous system. No evidence of deep or superficial venous thrombosis in bilateral lower extremity. 07/31/2016 -- the patient has a appointment to see the surgeons at [facility] next month 08/07/2016 -- for various reasons the patient could not have his dressing changed 3 times a week and he left his compression on for 2 days and removed it and has been doing his own local care as decided by him. The patient is noncompliant. ELIU, BATCH D. ([id number]) Objective Constitutional Pulse regular. Respirations normal and unlabored. Afebrile. Vitals Time Taken: 9:28 AM, Height: 73 in, Weight: 285 lbs, BMI: 37.6, Temperature: 98.3 F, Pulse: 80 bpm, Respiratory Rate: 18 breaths/min, Blood Pressure: 117/59 mmHg. Eyes Nonicteric. Reactive to light. Ears, Nose, Mouth, and Throat Lips, teeth, and gums  WNL.. Moist mucosa without lesions. Neck supple and nontender. No palpable supraclavicular or cervical adenopathy. Normal sized without goiter. Respiratory WNL. No retractions.. Breath sounds WNL, No rubs, rales, rhonchi, or wheeze.. Cardiovascular Heart rhythm and rate regular, no murmur or gallop.. Pedal Pulses WNL. No clubbing, cyanosis or edema. Chest Breasts symmetical and no nipple discharge.. Breast tissue WNL, no masses, lumps, or tenderness.. Lymphatic No adneopathy. No adenopathy. No adenopathy. Musculoskeletal Adexa without tenderness or enlargement.. Digits and nails w/o clubbing, cyanosis, infection, petechiae, ischemia, or inflammatory conditions.Marland Kitchen Psychiatric Judgement and insight Intact.. No evidence of depression, anxiety, or  agitation.. General Notes: the wound is improved overall and minimal debridement required to remove subcutaneous debris car and the size is much smaller Integumentary (Hair, Skin) No suspicious lesions. No crepitus or fluctuance. No peri-wound warmth or erythema. No masses.. Wound #1 status is Open. Original cause of wound was Blister. The wound is located on the Left,Medial Lower Leg. The wound measures 2.5cm length x 1.5cm width x 0.1cm depth; 2.945cm^2 area and 0.295cm^3 volume. The wound is limited to skin breakdown. There is no tunneling or undermining noted. SHIQUAN, MATHIEU D. ([id number]) There is a large amount of serosanguineous drainage noted. The wound margin is flat and intact. There is medium (34-66%) red granulation within the wound bed. There is a medium (34-66%) amount of necrotic tissue within the wound bed including Eschar and Adherent Slough. The periwound skin appearance exhibited: Erythema. The periwound skin appearance did not exhibit: Callus, Crepitus, Excoriation, Induration, Rash, Scarring, Dry/Scaly, Maceration, Atrophie Blanche, Cyanosis, Ecchymosis, Hemosiderin Staining, Mottled, Pallor, Rubor. The surrounding wound skin color is noted with erythema which is circumferential. Periwound temperature was noted as No Abnormality. The periwound has tenderness on palpation. 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 There was a Skin/Subcutaneous Tissue Debridement ([id number]) debridement with total area of 3.75 sq cm performed by Christin Fudge, MD. with the following instrument(s): Curette to remove Viable and  Non-Viable tissue/material including Exudate, Fibrin/Slough, Eschar, and Subcutaneous after achieving pain control using Lidocaine 4% Topical Solution. A time out was conducted at 09:46, prior to the start of the procedure. A Minimum amount of bleeding was controlled with Pressure. The procedure was tolerated well with a pain level of 0 throughout and a pain level of 0 following the procedure. Post Debridement Measurements: 3.4cm length x 2.5cm width x 0.1cm depth; 0.668cm^3 volume. Character of Wound/Ulcer Post Debridement is improved. Severity of Tissue Post Debridement is: Fat layer exposed. Post procedure Diagnosis Wound #1: Same as Pre-Procedure Plan MARGARITO, DEHAAS ([id number]) Wound Cleansing: Wound #1 Left,Medial Lower Leg: Clean wound with Normal Saline. Anesthetic: Wound #1 Left,Medial Lower Leg: Topical Lidocaine 4% cream applied to wound bed prior to debridement Primary Wound Dressing: Wound #1 Left,Medial Lower Leg: Hydrafera Blue Secondary Dressing: Wound #1 Left,Medial Lower Leg: ABD pad - secure with tape Dressing Change Frequency: Wound #1 Left,Medial Lower Leg: Change dressing every other day. Follow-up Appointments: Wound #1 Left,Medial Lower Leg: Return Appointment in 1 week. Edema Control: Wound #1 Left,Medial Lower Leg: Patient to wear own Juxtalite/Juzo compression garment. - wear at al times during the day and take off at night and elevate your legs  at night I have recommended: 1. Hydrofera blue and compression with juxta light compression wraps. 2. Elevation and exercise 3. Good control of his diabetes mellitus 4. he says that he will be seeing [facility] next month 5. Review next week in the wound center Electronic Signature(s) Signed: 08/21/2016 10:26:13 AM By: Christin Fudge MD, FACS Entered By: Christin Fudge on 08/21/2016 10:26:13 [name] [name] ([id number]) -------------------------------------------------------------------------------- SuperBill  Details Patient Name: [name] [name]. Date of Service: 08/21/2016 Medical Record Number: [id number] Patient Account Number: [id number] Date of Birth/Sex: 03/01/1976 (41 y.o. 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 Facility Procedures CPT4 Code: [id number] Description: 26834 - DEB SUBQ TISSUE 20 SQ CM/< ICD-10 Description Diagnosis E11.622 Type 2 diabetes mellitus with other skin ulcer Z99.2 Dependence on renal dialysis L97.222 Non-pressure chronic ulcer of left calf with fat l I83.022 Varicose veins of  left lower extremity with ulcer Modifier: ayer exposed of calf Quantity: 1 Physician Procedures CPT4 Code: [id number] Description: 79892 - WC PHYS SUBQ TISS 20 SQ CM ICD-10 Description Diagnosis E11.622 Type 2 diabetes mellitus with other skin ulcer Z99.2 Dependence on renal dialysis L97.222 Non-pressure chronic ulcer of left calf with fat l I83.022 Varicose veins of  left lower extremity with ulcer Modifier: ayer exposed of calf Quantity: 1 Electronic Signature(s) Signed: 08/21/2016 10:26:26 AM By: Christin Fudge MD, FACS Entered By: Christin Fudge on 08/21/2016 10:26:25

## 2016-08-28 ENCOUNTER — Encounter: Payer: Medicare Other | Attending: Surgery | Admitting: Surgery

## 2016-08-28 DIAGNOSIS — I83022 Varicose veins of left lower extremity with ulcer of calf: Secondary | ICD-10-CM | POA: Insufficient documentation

## 2016-08-28 DIAGNOSIS — Z992 Dependence on renal dialysis: Secondary | ICD-10-CM | POA: Insufficient documentation

## 2016-08-28 DIAGNOSIS — N189 Chronic kidney disease, unspecified: Secondary | ICD-10-CM | POA: Diagnosis not present

## 2016-08-28 DIAGNOSIS — E1122 Type 2 diabetes mellitus with diabetic chronic kidney disease: Secondary | ICD-10-CM | POA: Insufficient documentation

## 2016-08-28 DIAGNOSIS — Z7901 Long term (current) use of anticoagulants: Secondary | ICD-10-CM | POA: Diagnosis not present

## 2016-08-28 DIAGNOSIS — Z79899 Other long term (current) drug therapy: Secondary | ICD-10-CM | POA: Insufficient documentation

## 2016-08-28 DIAGNOSIS — Z794 Long term (current) use of insulin: Secondary | ICD-10-CM | POA: Diagnosis not present

## 2016-08-28 DIAGNOSIS — I130 Hypertensive heart and chronic kidney disease with heart failure and stage 1 through stage 4 chronic kidney disease, or unspecified chronic kidney disease: Secondary | ICD-10-CM | POA: Insufficient documentation

## 2016-08-28 DIAGNOSIS — E11622 Type 2 diabetes mellitus with other skin ulcer: Secondary | ICD-10-CM | POA: Diagnosis present

## 2016-08-28 DIAGNOSIS — L97222 Non-pressure chronic ulcer of left calf with fat layer exposed: Secondary | ICD-10-CM | POA: Diagnosis not present

## 2016-08-28 DIAGNOSIS — I509 Heart failure, unspecified: Secondary | ICD-10-CM | POA: Insufficient documentation

## 2016-08-30 NOTE — Progress Notes (Signed)
TOSHIRO, HANKEN ([id number]) Visit Report for 08/28/2016 Arrival Information Details Patient Name: STAFFORD, RIVIERA. Date of Service: 08/28/2016 10:15 AM Medical Record Number: [id number] Patient Account Number: [id number] Date of Birth/Sex: 04-29-1975 (41 y.o. Male) Treating RN: Montey Hora Primary Care Fayne Mcguffee: Kasandra Knudsen Other Clinician: Referring Sadiel Mota: Kasandra Knudsen Treating Giovonni Poirier/Extender: Frann Rider in Treatment: 12 Visit Information History Since Last Visit Added or deleted any medications: No Patient Arrived: Ambulatory Any new allergies or adverse reactions: No Arrival Time: 10:35 Had a fall or experienced change in No Accompanied By: self activities of daily living that may affect Transfer Assistance: None risk of falls: Patient Identification Verified: Yes Signs or symptoms of abuse/neglect since last No Secondary Verification Process Yes visito Completed: Hospitalized since last visit: No Patient Requires Transmission- No Has Dressing in Place as Prescribed: Yes Based Precautions: Has Compression in Place as Prescribed: Yes Patient Has Alerts: Yes Pain Present Now: No Patient Alerts: Patient on Blood Thinner Electronic Signature(s) Signed: 08/28/2016 4:40:41 PM By: Montey Hora Entered By: Montey Hora on 08/28/2016 10:36:20 [name] ([id number]) -------------------------------------------------------------------------------- Encounter Discharge Information Details Patient Name: [name]. Date of Service: 08/28/2016 10:15 AM Medical Record Number: [id number] Patient Account Number: [id number] Date of Birth/Sex: June 21, 1975 (41 y.o. Male) Treating RN: Montey Hora Primary Care Anyra Kaufman: Kasandra Knudsen Other Clinician: Referring Zeric Baranowski: Kasandra Knudsen Treating Pama Roskos/Extender: Frann Rider in Treatment: 12 Encounter Discharge Information Items Discharge Pain Level: 0 Discharge Condition:  Stable Ambulatory Status: Ambulatory Discharge Destination: Home Transportation: Private Auto Accompanied By: self Schedule Follow-up Appointment: Yes Medication Reconciliation completed No and provided to Patient/Care Nelva Hauk: Patient Clinical Summary of Care: Declined Electronic Signature(s) Signed: 08/28/2016 11:34:27 AM By: Ruthine Dose Entered By: Ruthine Dose on 08/28/2016 11:34:26 [name] ([id number]) -------------------------------------------------------------------------------- Lower Extremity Assessment Details Patient Name: [name]. Date of Service: 08/28/2016 10:15 AM Medical Record Number: [id number] Patient Account Number: [id number] Date of Birth/Sex: 1975-08-10 (41 y.o. Male) Treating RN: Montey Hora Primary Care Cirilo Canner: Kasandra Knudsen Other Clinician: Referring Shantella Blubaugh: Kasandra Knudsen Treating Kyriaki Moder/Extender: Frann Rider in Treatment: 12 Vascular Assessment Pulses: Dorsalis Pedis Palpable: [Left:Yes] Posterior Tibial Extremity colors, hair growth, and conditions: Extremity Color: [Left:Hyperpigmented] Hair Growth on Extremity: [Left:Yes] Temperature of Extremity: [Left:Warm] Capillary Refill: [Left:< 3 seconds] Electronic Signature(s) Signed: 08/28/2016 4:40:41 PM By: Montey Hora Entered By: Montey Hora on 08/28/2016 10:50:20 [name] ([id number]) -------------------------------------------------------------------------------- Multi Wound Chart Details Patient Name: [name]. Date of Service: 08/28/2016 10:15 AM Medical Record Number: [id number] Patient Account Number: [id number] Date of Birth/Sex: 09-Apr-1976 (41 y.o. Male) Treating RN: Montey Hora Primary Care Aniesa Boback: Kasandra Knudsen Other Clinician: Referring Yariah Selvey: Kasandra Knudsen Treating Mallorie Norrod/Extender: Frann Rider in Treatment: 12 Vital Signs Height(in): 73 Pulse(bpm): 80 Weight(lbs): 285 Blood  Pressure 119/67 (mmHg): Body Mass Index(BMI): 38 Temperature(F): 98.2 Respiratory Rate 18 (breaths/min): Photos: [1:No Photos] [N/A:N/A] Wound Location: [1:Left Lower Leg - Medial] [N/A:N/A] Wounding Event: [1:Blister] [N/A:N/A] Primary Etiology: [1:Diabetic Wound/Ulcer of the Lower Extremity] [N/A:N/A] Secondary Etiology: [1:Venous Leg Ulcer] [N/A:N/A] Comorbid History: [1:Hypertension, Type II Diabetes] [N/A:N/A] Date Acquired: [1:06/03/2016] [N/A:N/A] Weeks of Treatment: [1:12] [N/A:N/A] Wound Status: [1:Open] [N/A:N/A] Measurements L x W x D 1.1x0.5x0.1 [N/A:N/A] (cm) Area (cm) : [1:0.432] [N/A:N/A] Volume (cm) : [1:0.043] [N/A:N/A] % Reduction in Area: [1:99.40%] [N/A:N/A] % Reduction in Volume: 99.40% [N/A:N/A] Classification: [1:Grade 1] [N/A:N/A] Exudate Amount: [1:Large] [N/A:N/A] Exudate Type: [1:Serosanguineous] [N/A:N/A] Exudate Color: [1:red, brown] [N/A:N/A] Wound Margin: [1:Flat and Intact] [N/A:N/A] Granulation Amount: [1:Large (67-100%)] [N/A:N/A] Granulation Quality: [1:Red] [N/A:N/A]  Necrotic Amount: [1:Small (1-33%)] [N/A:N/A] Necrotic Tissue: [1:Eschar, Adherent Slough] [N/A:N/A] Exposed Structures: [1:Fascia: No Fat Layer (Subcutaneous Tissue) Exposed: No Tendon: No] [N/A:N/A] Muscle: No Joint: No Bone: No Limited to Skin Breakdown Epithelialization: Small (1-33%) N/A N/A Debridement: Open Wound/Selective N/A N/A ([id number]) - Selective Pre-procedure 10:51 N/A N/A Verification/Time Out Taken: Pain Control: Lidocaine 4% Topical N/A N/A Solution Tissue Debrided: Exudates, Skin N/A N/A Level: Skin/Dermis N/A N/A Debridement Area (sq 0.55 N/A N/A cm): Instrument: Forceps N/A N/A Bleeding: Minimum N/A N/A Hemostasis Achieved: Pressure N/A N/A Procedural Pain: 0 N/A N/A Post Procedural Pain: 0 N/A N/A Debridement Treatment Procedure was tolerated N/A N/A Response: well Post Debridement 1.1x0.5x0.1 N/A N/A Measurements L x W x  D (cm) Post Debridement 0.043 N/A N/A Volume: (cm) Periwound Skin Texture: Excoriation: No N/A N/A Induration: No Callus: No Crepitus: No Rash: No Scarring: No Periwound Skin Maceration: No N/A N/A Moisture: Dry/Scaly: No Periwound Skin Color: Erythema: Yes N/A N/A Atrophie Blanche: No Cyanosis: No Ecchymosis: No Hemosiderin Staining: No Mottled: No Pallor: No Rubor: No Erythema Location: Circumferential N/A N/A Temperature: No Abnormality N/A N/A Tenderness on Yes N/A N/A Palpation: Wound Preparation: N/A N/A ANURAG, SCARFO ([id number]) Ulcer Cleansing: Rinsed/Irrigated with Saline Topical Anesthetic Applied: Other: lidocaine 4% Procedures Performed: Debridement N/A N/A Treatment Notes Wound #1 (Left, Medial Lower Leg) 1. Cleansed with: Clean wound with Normal Saline 2. Anesthetic Topical Lidocaine 4% cream to wound bed prior to debridement 4. Dressing Applied: Hydrafera Blue 5. Secondary Dressing Applied ABD Pad 7. Secured with Recruitment consultant) Signed: 08/28/2016 11:10:08 AM By: Christin Fudge MD, FACS Entered By: Christin Fudge on 08/28/2016 11:10:08 [name] ([id number]) -------------------------------------------------------------------------------- [place] Details Patient Name: [name]. Date of Service: 08/28/2016 10:15 AM Medical Record Number: [id number] Patient Account Number: [id number] Date of Birth/Sex: [birth year] (41 y.o. Male) Treating RN: Montey Hora Primary Care Harlow Carrizales: Kasandra Knudsen Other Clinician: Referring Alanna Storti: Kasandra Knudsen Treating Vicke Plotner/Extender: Frann Rider in Treatment: 12 Active Inactive ` Orientation to the Wound Care Program Nursing Diagnoses: Knowledge deficit related to the wound healing center program Goals: Patient/caregiver will verbalize understanding of the [facility] Program Date Initiated: 06/05/2016 Target Resolution Date:  08/29/2016 Goal Status: Active Interventions: Provide education on orientation to the wound center Notes: ` Wound/Skin Impairment Nursing Diagnoses: Knowledge deficit related to ulceration/compromised skin integrity Goals: Patient/caregiver will verbalize understanding of skin care regimen Date Initiated: 06/05/2016 Target Resolution Date: 08/29/2016 Goal Status: Active Ulcer/skin breakdown will have a volume reduction of 30% by week 4 Date Initiated: 06/05/2016 Target Resolution Date: 08/29/2016 Goal Status: Active Ulcer/skin breakdown will have a volume reduction of 50% by week 8 Date Initiated: 06/05/2016 Target Resolution Date: 08/29/2016 Goal Status: Active Ulcer/skin breakdown will have a volume reduction of 80% by week 12 Date Initiated: 06/05/2016 Target Resolution Date: 08/29/2016 Goal Status: Active Ulcer/skin breakdown will heal within 14 weeks Date Initiated: 06/05/2016 Target Resolution Date: 08/29/2016 MALEEK, CRAVER ([id number]) Goal Status: Active Interventions: Assess patient/caregiver ability to obtain necessary supplies Assess patient/caregiver ability to perform ulcer/skin care regimen upon admission and as needed Assess ulceration(s) every visit Notes: Electronic Signature(s) Signed: 08/28/2016 4:40:41 PM By: Montey Hora Entered By: Montey Hora on 08/28/2016 10:50:25 [name] ([id number]) -------------------------------------------------------------------------------- Pain Assessment Details Patient Name: [name]. Date of Service: 08/28/2016 10:15 AM Medical Record Number: [id number] Patient Account Number: [id number] Date of Birth/Sex: Oct 17, 1975 (41 y.o. Male) Treating RN: Montey Hora Primary Care Selassie Spatafore: Kasandra Knudsen Other Clinician: Referring Ami Thornsberry: Matthew Saras,  Lesage Treating Colten Desroches/Extender: Frann Rider in Treatment: 12 Active Problems Location of Pain Severity and Description of Pain Patient Has Paino No Site  Locations Pain Management and Medication Current Pain Management: Notes Topical or injectable lidocaine is offered to patient for acute pain when surgical debridement is performed. If needed, Patient is instructed to use over the counter pain medication for the following 24-48 hours after debridement. Wound care MDs do not prescribed pain medications. Patient has chronic pain or uncontrolled pain. Patient has been instructed to make an appointment with their Primary Care Physician for pain management. [id number] Electronic Signature(s) Signed: 08/28/2016 4:40:41 PM By: Montey Hora Entered By: Montey Hora on 08/28/2016 10:36:37 [name] ([id number]) -------------------------------------------------------------------------------- Patient/Caregiver Education Details Patient Name: [name]. Date of Service: 08/28/2016 10:15 AM Medical Record Number: [id number] Patient Account Number: [id number] Date of Birth/Gender: Feb 19, 1976 (41 y.o. Male) Treating RN: Montey Hora Primary Care Physician: Kasandra Knudsen Other Clinician: Referring Physician: Kasandra Knudsen Treating Physician/Extender: Frann Rider in Treatment: 12 Education Assessment Education Provided To: Patient Education Topics Provided Wound/Skin Impairment: Handouts: Other: wound care as ordered Methods: Demonstration, Explain/Verbal Responses: State content correctly Electronic Signature(s) Signed: 08/28/2016 4:40:41 PM By: Montey Hora Entered By: Montey Hora on 08/28/2016 10:51:26 [name] ([id number]) -------------------------------------------------------------------------------- Wound Assessment Details Patient Name: [name]. Date of Service: 08/28/2016 10:15 AM Medical Record Number: [id number] Patient Account Number: [id number] Date of Birth/Sex: 25-May-1975 (41 y.o. Male) Treating RN: Montey Hora Primary Care Brien Lowe: Kasandra Knudsen Other Clinician: Referring  Daoud Lobue: Kasandra Knudsen Treating Sabena Winner/Extender: Frann Rider in Treatment: 12 Wound Status Wound Number: 1 Primary Etiology: Diabetic Wound/Ulcer of the Lower Extremity Wound Location: Left Lower Leg - Medial Secondary Venous Leg Ulcer Wounding Event: Blister Etiology: Date Acquired: 06/03/2016 Wound Status: Open Weeks Of Treatment: 12 Comorbid Hypertension, Type II Diabetes Clustered Wound: No History: Photos Photo Uploaded By: Montey Hora on 08/29/2016 12:51:21 Wound Measurements Length: (cm) 1.1 Width: (cm) 0.5 Depth: (cm) 0.1 Area: (cm) 0.432 Volume: (cm) 0.043 % Reduction in Area: 99.4% % Reduction in Volume: 99.4% Epithelialization: Small (1-33%) Tunneling: No Undermining: No Wound Description Classification: Grade 1 Foul Odor Afte Wound Margin: Flat and Intact Slough/Fibrino Exudate Amount: Large Exudate Type: Serosanguineous Exudate Color: red, brown r Cleansing: No No Wound Bed Granulation Amount: Large (67-100%) Exposed Structure Granulation Quality: Red Fascia Exposed: No Necrotic Amount: Small (1-33%) Fat Layer (Subcutaneous Tissue) Exposed: No Necrotic Quality: Eschar, Adherent Slough Tendon Exposed: No EARLAND, REISH ([id number]) Muscle Exposed: No Joint Exposed: No Bone Exposed: No Limited to Skin Breakdown Periwound Skin Texture Texture Color No Abnormalities Noted: No No Abnormalities Noted: No Callus: No Atrophie Blanche: No Crepitus: No Cyanosis: No Excoriation: No Ecchymosis: No Induration: No Erythema: Yes Rash: No Erythema Location: Circumferential Scarring: No Hemosiderin Staining: No Mottled: No Moisture Pallor: No No Abnormalities Noted: No Rubor: No Dry / Scaly: No Maceration: No Temperature / Pain Temperature: No Abnormality Tenderness on Palpation: Yes Wound Preparation Ulcer Cleansing: Rinsed/Irrigated with Saline Topical Anesthetic Applied: Other: lidocaine 4%, Treatment Notes Wound #1  (Left, Medial Lower Leg) 1. Cleansed with: Clean wound with Normal Saline 2. Anesthetic Topical Lidocaine 4% cream to wound bed prior to debridement 4. Dressing Applied: Hydrafera Blue 5. Secondary Dressing Applied ABD Pad 7. Secured with Recruitment consultant) Signed: 08/28/2016 4:40:41 PM By: Montey Hora Entered By: Montey Hora on 08/28/2016 10:49:59 [name] ([id number]) -------------------------------------------------------------------------------- Vitals Details Patient Name: [name]. Date of Service: 08/28/2016 10:15 AM Medical Record Number:  [id number] Patient Account Number: [id number] Date of Birth/Sex: [birth year] (41 y.o. Male) Treating RN: Montey Hora Primary Care Lareen Mullings: Kasandra Knudsen Other Clinician: Referring Lubna Stegeman: Kasandra Knudsen Treating Joetta Delprado/Extender: Frann Rider in Treatment: 12 Vital Signs Time Taken: 10:37 Temperature (F): 98.2 Height (in): 73 Pulse (bpm): 80 Weight (lbs): 285 Respiratory Rate (breaths/min): 18 Body Mass Index (BMI): 37.6 Blood Pressure (mmHg): 119/67 Reference Range: 80 - 120 mg / dl Electronic Signature(s) Signed: 08/28/2016 4:40:41 PM By: Montey Hora Entered By: Montey Hora on 08/28/2016 10:38:57

## 2016-08-30 NOTE — Progress Notes (Signed)
[name] [name] ([id number]) Visit Report for 08/28/2016 Chief Complaint Document Details Patient Name: [name] [name], [name] [name]. Date of Service: 08/28/2016 10:15 AM Medical Record Number: [id number] Patient Account Number: [id number] Date of Birth/Sex: 1975-09-12 (41 y.o. Male) Treating RN: Montey Hora Primary Care Provider: Kasandra Knudsen Other Clinician: Referring Provider: Kasandra Knudsen Treating Provider/Extender: Frann Rider in Treatment: 12 Information Obtained from: Patient Chief Complaint Patients presents for treatment of an open diabetic ulcer the left lower extremity for 5 days Electronic Signature(s) Signed: 08/28/2016 11:10:53 AM By: Christin Fudge MD, FACS Entered By: Christin Fudge on 08/28/2016 11:10:52 [name] [name] ([id number]) -------------------------------------------------------------------------------- Debridement Details Patient Name: [name] [name]. Date of Service: 08/28/2016 10:15 AM Medical Record Number: [id number] Patient Account Number: [id number] Date of Birth/Sex: [birth year] (41 y.o. Male) Treating RN: Montey Hora Primary Care Provider: Kasandra Knudsen Other Clinician: Referring Provider: Kasandra Knudsen Treating Provider/Extender: Frann Rider in Treatment: 12 Debridement Performed for Wound #1 Left,Medial Lower Leg Assessment: Performed By: Physician Christin Fudge, MD Debridement: Open Wound/Selective Debridement Selective Description: Pre-procedure Yes - 10:51 Verification/Time Out Taken: Start Time: 10:51 Pain Control: Lidocaine 4% Topical Solution Level: Skin/Dermis Total Area Debrided (L x 1.1 (cm) x 0.5 (cm) = 0.55 (cm) W): Tissue and other Non-Viable, Eschar, Exudate, Skin material debrided: Instrument: Forceps Bleeding: Minimum Hemostasis Achieved: Pressure End Time: 10:53 Procedural Pain: 0 Post Procedural Pain: 0 Response to Treatment: Procedure was tolerated well Post Debridement Measurements of Total  Wound Length: (cm) 1.1 Width: (cm) 0.5 Depth: (cm) 0.1 Volume: (cm) 0.043 Character of Wound/Ulcer Post Improved Debridement: Severity of Tissue Post Debridement: Fat layer exposed Post Procedure Diagnosis Same as Pre-procedure Electronic Signature(s) Signed: 08/28/2016 11:10:44 AM By: Christin Fudge MD, FACS Signed: 08/28/2016 4:40:41 PM By: Horald Pollen ([id number]) Previous Signature: 08/28/2016 11:10:20 AM Version By: Christin Fudge MD, FACS Entered By: Christin Fudge on 08/28/2016 11:10:44 [name] [name] ([id number]) -------------------------------------------------------------------------------- HPI Details Patient Name: [name] [name]. Date of Service: 08/28/2016 10:15 AM Medical Record Number: [id number] Patient Account Number: [id number] Date of Birth/Sex: Feb 09, 1976 (41 y.o. Male) Treating RN: Montey Hora Primary Care Provider: Kasandra Knudsen Other Clinician: Referring Provider: Kasandra Knudsen Treating Provider/Extender: Frann Rider in Treatment: 12 History of Present Illness Location: left lower extremity Quality: Patient reports experiencing a dull pain to affected area(s). Severity: Patient states wound (s) are getting better. Duration: Patient has had the wound for < 2 weeks prior to presenting for treatment Timing: Pain in wound is Intermittent (comes and goes Context: The wound appeared gradually over time Modifying Factors: Other treatment(s) tried include:local care and oral antibiotic Associated Signs and Symptoms: Patient reports having increase swelling. HPI Description: 41 year old gentleman who has noticed a blister on his left lower extremity which came up 5 days ago and it has drained fluid. He was seen by hispractitioner who reviewed him and put him on doxycycline now asked him to come to the wound center. His past medical history is significant for diabetes mellitus type 2, congestive heart failure, renal  failure, hypertension, and he is receiving hemodialysis 3 times a week. He has never been a smoker.Marland Kitchen 06/12/2016 -- the patient has in continue with hemodialysis and no changes have occurred in the last week. 07/03/2016 -- the patient has been doing his dressings only every other day and has not been washing was scrubbing the wound as advised 07/10/2016 -- the patient did not schedule his venous duplex study for reflux. He is agreeable about getting a wound biopsy done today. 07/24/2016 --  pathology report -- DIAGNOSIS: A. 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 - NEGATIVE FOR MALIGNANCY -- venous study done on 07/22/2016 shows venous incompetence in the bilateral great saphenous vein as well as the deep venous system. No evidence of deep or superficial venous thrombosis in bilateral lower extremity. 07/31/2016 -- the patient has a appointment to see the surgeons at [facility] next month 08/07/2016 -- for various reasons the patient could not have his dressing changed 3 times a week and he left his compression on for 2 days and removed it and has been doing his own local care as decided by him. The patient is noncompliant. Electronic Signature(s) Signed: 08/28/2016 11:11:00 AM By: Christin Fudge MD, FACS Entered By: Christin Fudge on 08/28/2016 11:10:59 [name] [name] ([id number]) -------------------------------------------------------------------------------- Physical Exam Details Patient Name: [name] [name]. Date of Service: 08/28/2016 10:15 AM Medical Record Number: [id number] Patient Account Number: [id number] Date of Birth/Sex: 1976/04/10 (41 y.o. Male) Treating RN: Montey Hora Primary Care Provider: Kasandra Knudsen Other Clinician: Referring Provider: Kasandra Knudsen Treating Provider/Extender: Frann Rider in Treatment: 12 Constitutional . Pulse regular. Respirations normal and unlabored.  Afebrile. . Eyes Nonicteric. Reactive to light. Ears, Nose, Mouth, and Throat Lips, teeth, and gums WNL.Marland Kitchen Moist mucosa without lesions. Neck supple and nontender. No palpable supraclavicular or cervical adenopathy. Normal sized without goiter. Respiratory WNL. No retractions.. Breath sounds WNL, No rubs, rales, rhonchi, or wheeze.. Cardiovascular Heart rhythm and rate regular, no murmur or gallop.. Pedal Pulses WNL. No clubbing, cyanosis or edema. Chest Breasts symmetical and no nipple discharge.. Breast tissue WNL, no masses, lumps, or tenderness.. Lymphatic No adneopathy. No adenopathy. No adenopathy. Musculoskeletal Adexa without tenderness or enlargement.. Digits and nails w/o clubbing, cyanosis, infection, petechiae, ischemia, or inflammatory conditions.. Integumentary (Hair, Skin) No suspicious lesions. No crepitus or fluctuance. No peri-wound warmth or erythema. No masses.Marland Kitchen Psychiatric Judgement and insight Intact.. No evidence of depression, anxiety, or agitation.. Notes there is excellent resolution of his problems and using a sharp toothed forcep I have removed some of the eschar and surrounding debris and there is healthy granulation tissue below this. Electronic Signature(s) Signed: 08/28/2016 11:11:26 AM By: Christin Fudge MD, FACS Entered By: Christin Fudge on 08/28/2016 11:11:25 [name] [name] ([id number]) -------------------------------------------------------------------------------- Physician Orders Details Patient Name: [name] [name]. Date of Service: 08/28/2016 10:15 AM Medical Record Number: [id number] Patient Account Number: [id number] Date of Birth/Sex: 1975/05/22 (41 y.o. Male) Treating RN: Montey Hora Primary Care Provider: Kasandra Knudsen Other Clinician: Referring Provider: Kasandra Knudsen Treating Provider/Extender: Frann Rider in Treatment: 12 Verbal / Phone Orders: No Diagnosis Coding Wound Cleansing Wound #1 Left,Medial Lower  Leg o Clean wound with Normal Saline. Anesthetic Wound #1 Left,Medial Lower Leg o Topical Lidocaine 4% cream applied to wound bed prior to debridement Primary Wound Dressing Wound #1 Left,Medial Lower Leg o Hydrafera Blue Secondary Dressing Wound #1 Left,Medial Lower Leg o ABD pad - secure with tape Dressing Change Frequency Wound #1 Left,Medial Lower Leg o Change dressing every other day. Follow-up Appointments Wound #1 Left,Medial Lower Leg o Return Appointment in 1 week. Edema Control Wound #1 Left,Medial Lower Leg o Patient to wear own Juxtalite/Juzo compression garment. - wear at al times during the day and take off at night and elevate your legs at night Electronic Signature(s) Signed: 08/28/2016 3:54:55 PM By: Christin Fudge MD, FACS Signed: 08/28/2016 4:40:41 PM By: Montey Hora Entered By: Montey Hora on 08/28/2016 10:53:42 [name] [name]  D. ([id number] [name] ([id number]) -------------------------------------------------------------------------------- Problem List Details Patient Name: [name] [name], [name] [name]. Date of Service: 08/28/2016 10:15 AM Medical Record Number: [id number] Patient Account Number: [id number] Date of Birth/Sex: [birth year] (41 y.o. 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 Date of Service: 08/28/2016 10:15 AM Medical Record Number: [id number] Patient Account Number: [id number] Date of Birth/Sex: [birth year] (41 y.o. Male) Treating RN: Montey Hora Primary Care Provider: Kasandra Knudsen Other Clinician: Referring Provider: Kasandra Knudsen Treating Provider/Extender: Frann Rider in Treatment: 12 Subjective Chief Complaint Information obtained from Patient Patients presents for treatment of an open diabetic ulcer the left lower extremity for 5 days History of Present Illness (HPI) The following HPI elements were documented for the patient's wound: Location: left lower extremity Quality: Patient reports experiencing a dull pain to affected area(s). Severity: Patient states wound (s) are getting better. Duration: Patient has had the wound for < 2 weeks prior to presenting for treatment Timing: Pain in wound is Intermittent (comes and goes Context: The wound appeared gradually over time Modifying Factors: Other treatment(s) tried include:local care and oral antibiotic Associated Signs and Symptoms: Patient reports having increase swelling. 41 year old gentleman who has noticed a blister on his left lower extremity which came up 5 days ago and it has drained fluid. He was seen by hispractitioner who reviewed him and put him on doxycycline now asked him to come to the wound center. His past medical history is significant for diabetes mellitus type 2, congestive heart failure, renal failure, hypertension, and he is receiving hemodialysis 3 times a week. He has never been a smoker.Marland Kitchen 06/12/2016 -- the patient has in continue with hemodialysis and no changes have occurred in the last week. 07/03/2016 -- the patient has been doing his dressings only every other day and has not been  washing was scrubbing the wound as advised 07/10/2016 -- the patient did not schedule his venous duplex study for reflux. He is agreeable about getting a wound biopsy done today. 07/24/2016 -- pathology report -- DIAGNOSIS: A. 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 - NEGATIVE FOR MALIGNANCY -- venous study done on 07/22/2016 shows venous incompetence in the bilateral great saphenous vein as well as the deep venous system. No evidence of deep or superficial venous thrombosis in bilateral lower extremity. 07/31/2016 -- the patient has a appointment to see the surgeons at [facility] next month 08/07/2016 -- for various reasons the patient could not have his dressing changed 3 times a week and he left his compression on for 2 days and removed it and has been doing his own local care as decided by him. The patient is noncompliant. [name] [name], [name] D. ([id number]) Objective Constitutional Pulse regular. Respirations normal and unlabored. Afebrile. Vitals Time Taken: 10:37 AM, Height: 73 in, Weight: 285 lbs, BMI: 37.6, Temperature: 98.2 F, Pulse: 80  bpm, Respiratory Rate: 18 breaths/min, Blood Pressure: 119/67 mmHg. Eyes Nonicteric. Reactive to light. Ears, Nose, Mouth, and Throat Lips, teeth, and gums WNL.Marland Kitchen Moist mucosa without lesions. Neck supple and nontender. No palpable supraclavicular or cervical adenopathy. Normal sized without goiter. Respiratory WNL. No retractions.. Breath sounds WNL, No rubs, rales, rhonchi, or wheeze.. Cardiovascular Heart rhythm and rate regular, no murmur or gallop.. Pedal Pulses WNL. No clubbing, cyanosis or edema. Chest Breasts symmetical and no nipple discharge.. Breast tissue WNL, no masses, lumps, or tenderness.. Lymphatic No adneopathy. No adenopathy. No adenopathy. Musculoskeletal Adexa without tenderness or enlargement.. Digits and nails w/o clubbing, cyanosis, infection,  petechiae, ischemia, or inflammatory conditions.Marland Kitchen Psychiatric Judgement and insight Intact.. No evidence of depression, anxiety, or agitation.. General Notes: there is excellent resolution of his problems and using a sharp toothed forcep I have removed some of the eschar and surrounding debris and there is healthy granulation tissue below this. Integumentary (Hair, Skin) No suspicious lesions. No crepitus or fluctuance. No peri-wound warmth or erythema. No masses.. Wound #1 status is Open. Original cause of wound was Blister. The wound is located on the Left,Medial Lower Leg. The wound measures 1.1cm length x 0.5cm width x 0.1cm depth; 0.432cm^2 area and 0.043cm^3 volume. The wound is limited to skin breakdown. There is no tunneling or undermining noted. [name] [name], [name] D. ([id number]) There is a large amount of serosanguineous drainage noted. The wound margin is flat and intact. There is large (67-100%) red granulation within the wound bed. There is a small (1-33%) amount of necrotic tissue within the wound bed including Eschar and Adherent Slough. The periwound skin appearance exhibited: Erythema. The periwound skin appearance did not exhibit: Callus, Crepitus, Excoriation, Induration, Rash, Scarring, Dry/Scaly, Maceration, Atrophie Blanche, Cyanosis, Ecchymosis, Hemosiderin Staining, Mottled, Pallor, Rubor. The surrounding wound skin color is noted with erythema which is circumferential. Periwound temperature was noted as No Abnormality. The periwound has tenderness on palpation. 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 There was a Skin/Dermis Open  Wound/Selective [phone]) debridement with total area of 0.55 sq cm performed by Christin Fudge, MD. with the following instrument(s): Forceps to remove Non-Viable tissue/material including Exudate, Eschar, and Skin after achieving pain control using Lidocaine 4% Topical Solution. A time out was conducted at 10:51, prior to the start of the procedure. A Minimum amount of bleeding was controlled with Pressure. The procedure was tolerated well with a pain level of 0 throughout and a pain level of 0 following the procedure. Post Debridement Measurements: 1.1cm length x 0.5cm width x 0.1cm depth; 0.043cm^3 volume. Character of Wound/Ulcer Post Debridement is improved. Severity of Tissue Post Debridement is: Fat layer exposed. Post procedure Diagnosis Wound #1: Same as Pre-Procedure Plan Wound Cleansing: [name] [name], [name] [name] ([id number]) Wound #1 Left,Medial Lower Leg: Clean wound with Normal Saline. Anesthetic: Wound #1 Left,Medial Lower Leg: Topical Lidocaine 4% cream applied to wound bed prior to debridement Primary Wound Dressing: Wound #1 Left,Medial Lower Leg: Hydrafera Blue Secondary Dressing: Wound #1 Left,Medial Lower Leg: ABD pad - secure with tape Dressing Change Frequency: Wound #1 Left,Medial Lower Leg: Change dressing every other day. Follow-up Appointments: Wound #1 Left,Medial Lower Leg: Return Appointment in 1 week.  Edema Control: Wound #1 Left,Medial Lower Leg: Patient to wear own Juxtalite/Juzo compression garment. - wear at al times during the day and take off at night and elevate your legs at night I have recommended: 1. Hydrofera blue and compression with juxta light compression wraps. 2. Elevation and exercise 3. Good control of his diabetes mellitus 4. he says that he will be seeing [facility] later this month 5. Review next week in the wound center Electronic Signature(s) Signed: 08/28/2016 11:12:07 AM By: Christin Fudge MD, FACS Entered By: Christin Fudge on  08/28/2016 11:12:07 [name] [name] ([id number]) -------------------------------------------------------------------------------- SuperBill Details Patient Name: [name] [name]. Date of Service: 08/28/2016 Medical Record Number: [id number] Patient Account Number: [id number] Date of Birth/Sex: 08-10-1975 (41 y.o. 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 Facility Procedures CPT4 Code: 10175102 Description: 58527 - DEBRIDE WOUND 1ST 20 SQ CM OR < ICD-10 Description Diagnosis E11.622 Type 2 diabetes mellitus with other skin ulcer L97.222 Non-pressure chronic ulcer of left calf with fat la I83.022 Varicose veins of left lower extremity with ulcer  o Modifier: yer exposed f calf Quantity: 1 Physician Procedures CPT4 Code: 7824235 Description: 97597 - WC PHYS DEBR WO ANESTH 20 SQ CM ICD-10 Description Diagnosis E11.622 Type 2 diabetes mellitus with other skin ulcer L97.222 Non-pressure chronic ulcer of left calf with fat la I83.022 Varicose veins of left lower extremity with ulcer  o Modifier: yer exposed f calf Quantity: 1 Electronic Signature(s) Signed: 08/28/2016 11:12:20 AM By: Christin Fudge MD, FACS Entered By: Christin Fudge on 08/28/2016 11:12:19

## 2016-09-04 ENCOUNTER — Encounter: Payer: Medicare Other | Admitting: Surgery

## 2016-09-04 DIAGNOSIS — E11622 Type 2 diabetes mellitus with other skin ulcer: Secondary | ICD-10-CM | POA: Diagnosis not present

## 2016-09-06 NOTE — Progress Notes (Signed)
RAWLINS, STUARD ([id number]) Visit Report for 09/04/2016 Arrival Information Details Patient Name: [name] [name], [name] [name]. Date of Service: 09/04/2016 12:30 PM Medical Record Number: [id number] Patient Account Number: [id number] Date of Birth/Sex: 03-17-1976 (41 y.o. Male) Treating RN: Montey Hora Primary Care Lorella Gomez: Evern Bio Other Clinician: Referring Baldomero Mirarchi: Evern Bio Treating Leahna Hewson/Extender: Frann Rider in Treatment: 13 Visit Information History Since Last Visit Added or deleted any medications: No Patient Arrived: Ambulatory Any new allergies or adverse reactions: No Arrival Time: 12:40 Had a fall or experienced change in No Accompanied By: self activities of daily living that may affect Transfer Assistance: None risk of falls: Patient Identification Verified: Yes Signs or symptoms of abuse/neglect since last No Secondary Verification Process Yes visito Completed: Hospitalized since last visit: No Patient Requires Transmission- No Has Dressing in Place as Prescribed: Yes Based Precautions: Has Compression in Place as Prescribed: Yes Patient Has Alerts: Yes Pain Present Now: No Patient Alerts: Patient on Blood Thinner Electronic Signature(s) Signed: 09/04/2016 5:23:46 PM By: Montey Hora Entered By: Montey Hora on 09/04/2016 12:41:15 [name] [name] ([id number]) -------------------------------------------------------------------------------- Encounter Discharge Information Details Patient Name: [name] [name]. Date of Service: 09/04/2016 12:30 PM Medical Record Number: [id number] Patient Account Number: [id number] Date of Birth/Sex: [birth year] (41 y.o. Male) Treating RN: Montey Hora Primary Care Anjuli Gemmill: Evern Bio Other Clinician: Referring Erica Richwine: Evern Bio Treating Dane Kopke/Extender: Frann Rider in Treatment: 13 Encounter Discharge Information Items Discharge Pain Level: 0 Discharge Condition:  Stable Ambulatory Status: Ambulatory Discharge Destination: Home Transportation: Private Auto Accompanied By: self Schedule Follow-up Appointment: Yes Medication Reconciliation completed No and provided to Patient/Care Kohen Reither: Patient Clinical Summary of Care: Declined Electronic Signature(s) Signed: 09/04/2016 1:14:04 PM By: Ruthine Dose Entered By: Ruthine Dose on 09/04/2016 13:14:03 [name] [name] ([id number]) -------------------------------------------------------------------------------- Lower Extremity Assessment Details Patient Name: [name] [name]. Date of Service: 09/04/2016 12:30 PM Medical Record Number: [id number] Patient Account Number: [id number] Date of Birth/Sex: [birth year] (41 y.o. Male) Treating RN: Montey Hora Primary Care Layza Summa: Evern Bio Other Clinician: Referring Fiorela Pelzer: Evern Bio Treating Jenavee Laguardia/Extender: Frann Rider in Treatment: 13 Vascular Assessment Pulses: Dorsalis Pedis Palpable: [Left:Yes] Posterior Tibial Extremity colors, hair growth, and conditions: Extremity Color: [Left:Hyperpigmented] Hair Growth on Extremity: [Left:Yes] Temperature of Extremity: [Left:Warm] Capillary Refill: [Left:< 3 seconds] Electronic Signature(s) Signed: 09/04/2016 5:23:46 PM By: Montey Hora Entered By: Montey Hora on 09/04/2016 12:52:43 [name] [name] ([id number]) -------------------------------------------------------------------------------- Multi Wound Chart Details Patient Name: [name] [name]. Date of Service: 09/04/2016 12:30 PM Medical Record Number: [id number] Patient Account Number: [id number] Date of Birth/Sex: [birth year] (41 y.o. 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 N/A N/A Comorbid History: Hypertension, Type II N/A N/A Diabetes Date Acquired: 06/03/2016 N/A N/A Weeks of Treatment: 13 N/A N/A Wound Status: Open N/A N/A Measurements L x W x D 0.7x0.3x0.1 N/A N/A (cm) Area (cm) : 0.165 N/A N/A Volume (cm) : 0.016 N/A N/A % Reduction in Area: 99.80% N/A N/A % Reduction in Volume: 99.80% N/A N/A Classification: Grade 1 N/A N/A Exudate Amount: Large N/A N/A Exudate Type: Serosanguineous N/A N/A Exudate Color: red, brown N/A N/A  Wound Margin: Flat and Intact N/A N/A Granulation Amount: Large (67-100%) N/A N/A Granulation Quality: Red N/A N/A GASPARE, NETZEL ([id number]) Necrotic Amount: None Present (0%) N/A N/A Exposed Structures: Fascia: No N/A N/A Fat Layer (Subcutaneous Tissue) Exposed: No Tendon: No Muscle: No Joint: No Bone: No Limited to Skin Breakdown Epithelialization: Medium (34-66%) N/A N/A Debridement: Debridement (43329- N/A N/A 11047) Pre-procedure 12:57 N/A N/A Verification/Time Out Taken: Pain Control: Lidocaine 4% Topical N/A N/A Solution Tissue Debrided: Fibrin/Slough, N/A N/A Subcutaneous Level: Skin/Subcutaneous N/A N/A Tissue Debridement Area (sq 0.21 N/A N/A cm): Instrument: Forceps N/A N/A Bleeding: Minimum N/A N/A Hemostasis Achieved: Pressure N/A N/A Procedural Pain: 0 N/A N/A Post Procedural Pain: 0 N/A N/A Debridement Treatment Procedure was tolerated N/A N/A Response: well Post Debridement 0.7x0.3x0.1 N/A N/A Measurements L x W x D (cm) Post Debridement 0.016 N/A N/A Volume: (cm) Periwound Skin Texture: Excoriation: No N/A N/A Induration: No Callus: No Crepitus: No Rash:  No Scarring: No Periwound Skin Maceration: No N/A N/A Moisture: Dry/Scaly: No Periwound Skin Color: Erythema: Yes N/A N/A Atrophie Blanche: No Cyanosis: No Ecchymosis: No Hemosiderin Staining: No Mottled: No ROSENDO, COUSER ([id number]) Pallor: No Rubor: No Erythema Location: Circumferential N/A N/A Temperature: No Abnormality N/A N/A Tenderness on Yes N/A N/A Palpation: Wound Preparation: Ulcer Cleansing: N/A N/A Rinsed/Irrigated with Saline Topical Anesthetic Applied: Other: lidocaine 4% Procedures Performed: Debridement N/A N/A Treatment Notes Wound #1 (Left, Medial Lower Leg) 1. Cleansed with: Clean wound with Normal Saline 2. Anesthetic Topical Lidocaine 4% cream to wound bed prior to debridement 4. Dressing Applied: Hydrafera Blue 5. Secondary Dressing Applied ABD Pad 7. Secured with Recruitment consultant) Signed: 09/04/2016 2:18:29 PM By: Christin Fudge MD, FACS Entered By: Christin Fudge on 09/04/2016 14:18:28 [name] [name] ([id number]) -------------------------------------------------------------------------------- [place] Details Patient Name: [name] [name]. Date of Service: 09/04/2016 12:30 PM Medical Record Number: [id number] Patient Account Number: [id number] Date of Birth/Sex: July 28, 1975 (41 y.o. Male) Treating RN: Montey Hora Primary Care Kelsi Benham: Evern Bio Other Clinician: Referring Ukiah Trawick: Evern Bio Treating Cesiah Westley/Extender: Frann Rider in Treatment: 13 Active Inactive ` Orientation to the Wound Care Program Nursing Diagnoses: Knowledge deficit related to the wound healing center program Goals: Patient/caregiver will verbalize understanding of the [facility] Date Initiated: 06/05/2016 Target Resolution Date: 08/29/2016 Goal Status: Active Interventions: Provide education on orientation to the wound center Notes: ` Wound/Skin Impairment Nursing  Diagnoses: Knowledge deficit related to ulceration/compromised skin integrity Goals: Patient/caregiver will verbalize understanding of skin care regimen Date Initiated: 06/05/2016 Target Resolution Date: 08/29/2016 Goal Status: Active Ulcer/skin breakdown will have a volume reduction of 30% by week 4 Date Initiated: 06/05/2016 Target Resolution Date: 08/29/2016 Goal Status: Active Ulcer/skin breakdown will have a volume reduction of 50% by week 8 Date Initiated: 06/05/2016 Target Resolution Date: 08/29/2016 Goal Status: Active Ulcer/skin breakdown will have a volume reduction of 80% by week 12 Date Initiated: 06/05/2016 Target Resolution Date: 08/29/2016 Goal Status: Active Ulcer/skin breakdown will heal within 14 weeks Date Initiated: 06/05/2016 Target Resolution Date: 08/29/2016 TUDOR, CHANDLEY ([id number]) Goal Status: Active Interventions: Assess patient/caregiver ability to obtain necessary supplies Assess patient/caregiver ability to perform ulcer/skin care regimen upon admission and as needed Assess ulceration(s) every visit Notes: Electronic Signature(s) Signed: 09/04/2016 5:23:46 PM By: Montey Hora Entered By: Montey Hora on 09/04/2016 12:56:39 [name] [name] ([id number]) -------------------------------------------------------------------------------- Pain Assessment Details Patient Name: [name] [name]. Date of Service: 09/04/2016 12:30 PM Medical Record Number: [id number] Patient Account Number: [id number] Date of Birth/Sex:  [birth year] (41 y.o. Male) Treating RN: Montey Hora Primary Care Jannifer Fischler: Evern Bio Other Clinician: Referring Armas Mcbee: Evern Bio Treating Xylon Croom/Extender: Frann Rider in Treatment: 13 Active Problems Location of Pain Severity and Description of Pain Patient Has Paino No Site Locations Pain Management and Medication Current Pain Management: Notes ical or injectable lidocaine is offered to patient for acute pain when  surgical debridement is performed. If needed, Patient is instructed to use over the counter pain medication for the following 24-48 hours after debridement. Wound care MDs do not prescribed pain medications. Patient has chronic pain or uncontrolled pain. Patient has been instructed to make an appointment with their Primary Care Physician for pain management. Electronic Signature(s) Signed: 09/04/2016 5:23:46 PM By: Montey Hora Entered By: Montey Hora on 09/04/2016 12:41:41 [name] [name] ([id number]) -------------------------------------------------------------------------------- Patient/Caregiver Education Details Patient Name: [name] [name]. Date of Service: 09/04/2016 12:30 PM Medical Record Number: [id number] Patient Account Number: [id number] Date of Birth/Gender: 10-08-1975 (41 y.o. Male) Treating RN: Montey Hora Primary Care Physician: Evern Bio Other Clinician: Referring Physician: Evern Bio Treating Physician/Extender: Frann Rider in Treatment: 13 Education Assessment Education Provided To: Patient Education Topics Provided Wound/Skin Impairment: Handouts: Other: wound care as ordered Methods: Demonstration, Explain/Verbal Responses: State content correctly Electronic Signature(s) Signed: 09/04/2016 5:23:46 PM By: Montey Hora Entered By: Montey Hora on 09/04/2016 12:57:23 [name] [name] ([id number]) -------------------------------------------------------------------------------- Wound Assessment Details Patient Name: [name] [name]. Date of Service: 09/04/2016 12:30 PM Medical Record Number: [id number] Patient Account Number: [id number] Date of Birth/Sex: 03/08/1976 (41 y.o. Male) Treating RN: Montey Hora Primary Care Jakya Dovidio: Evern Bio Other Clinician: Referring Allanna Bresee: Evern Bio Treating Damyiah Moxley/Extender: Frann Rider in Treatment: 13 Wound Status Wound Number: 1 Primary Etiology: Diabetic  Wound/Ulcer of the Lower Extremity Wound Location: Left Lower Leg - Medial Secondary Venous Leg Ulcer Wounding Event: Blister Etiology: Date Acquired: 06/03/2016 Wound Status: Open Weeks Of Treatment: 13 Comorbid Hypertension, Type II Diabetes Clustered Wound: No History: Photos Photo Uploaded By: Montey Hora on 09/04/2016 13:22:26 Wound Measurements Length: (cm) 0.7 Width: (cm) 0.3 Depth: (cm) 0.1 Area: (cm) 0.165 Volume: (cm) 0.016 % Reduction in Area: 99.8% % Reduction in Volume: 99.8% Epithelialization: Medium (34-66%) Tunneling: No Undermining: No Wound Description Classification: Grade 1 Wound Margin: Flat and Intact Exudate Amount: Large Exudate Type: Serosanguineous Exudate Color: red, brown Foul Odor After Cleansing: No Slough/Fibrino No Wound Bed Granulation Amount: Large (67-100%) Exposed Structure Granulation Quality: Red Fascia Exposed: No Necrotic Amount: None Present (0%) Fat Layer (Subcutaneous Tissue) Exposed: No Tendon Exposed: No JA, OHMAN ([id number]) Muscle Exposed: No Joint Exposed: No Bone Exposed: No Limited to Skin Breakdown Periwound Skin Texture Texture Color No Abnormalities Noted: No No Abnormalities Noted: No Callus: No Atrophie Blanche: No Crepitus: No Cyanosis: No Excoriation: No Ecchymosis: No Induration: No Erythema: Yes Rash: No Erythema Location: Circumferential Scarring: No Hemosiderin Staining: No Mottled: No Moisture Pallor: No No Abnormalities Noted: No Rubor: No Dry / Scaly: No Maceration: No Temperature / Pain Temperature: No Abnormality Tenderness on Palpation: Yes Wound Preparation Ulcer Cleansing: Rinsed/Irrigated with Saline Topical Anesthetic Applied: Other: lidocaine 4%, Treatment Notes Wound #1 (Left, Medial Lower Leg) 1. Cleansed with: Clean wound with Normal Saline 2. Anesthetic Topical Lidocaine 4% cream to wound bed prior to debridement 4. Dressing Applied: Hydrafera  Blue 5. Secondary Dressing Applied ABD Pad 7. Secured with Recruitment consultant) Signed: 09/04/2016 5:23:46 PM By: Montey Hora Entered By: Montey Hora on 09/04/2016 12:52:24 [name] [name] ([id number]) -------------------------------------------------------------------------------- [place] Details Patient Name:  Jannette Spanner D. Date of Service: 09/04/2016 12:30 PM Medical Record Number: [id number] Patient Account Number: [id number] Date of Birth/Sex: 1976-04-03 (41 y.o. Male) Treating RN: Montey Hora Primary Care Faren Florence: Evern Bio Other Clinician: Referring Amara Justen: Evern Bio Treating Lora Glomski/Extender: Frann Rider in Treatment: 13 Vital Signs Time Taken: 12:42 Temperature (F): 98.4 Height (in): 73 Pulse (bpm): 82 Weight (lbs): 285 Respiratory Rate (breaths/min): 18 Body Mass Index (BMI): 37.6 Blood Pressure (mmHg): 113/62 Reference Range: 80 - 120 mg / dl Electronic Signature(s) Signed: 09/04/2016 5:23:46 PM By: Montey Hora Entered By: Montey Hora on 09/04/2016 12:43:57

## 2016-09-06 NOTE — Progress Notes (Signed)
SUNDANCE, MOISE ([id number]) Visit Report for 09/04/2016 Chief Complaint Document Details Patient Name: [name] [name], [name] [name]. Date of Service: 09/04/2016 12:30 PM Medical Record Number: [id number] Patient Account Number: [id number] Date of Birth/Sex: 1976/03/10 (41 y.o. Male) Treating RN: Montey Hora Primary Care Provider: Evern Bio Other Clinician: Referring Provider: Evern Bio Treating Provider/Extender: Frann Rider in Treatment: 13 Information Obtained from: Patient Chief Complaint Patients presents for treatment of an open diabetic ulcer the left lower extremity for 5 days Electronic Signature(s) Signed: 09/04/2016 2:19:16 PM By: Christin Fudge MD, FACS Entered By: Christin Fudge on 09/04/2016 14:19:16 [name] [name] ([id number]) -------------------------------------------------------------------------------- Debridement Details Patient Name: [name] [name]. Date of Service: 09/04/2016 12:30 PM Medical Record Number: [id number] Patient Account Number: [id number] Date of Birth/Sex: Jun 18, 1975 (41 y.o. Male) Treating RN: Montey Hora Primary Care Provider: Evern Bio Other Clinician: Referring Provider: Evern Bio Treating Provider/Extender: Frann Rider in Treatment: 13 Debridement Performed for Wound #1 Left,Medial Lower Leg Assessment: Performed By: Physician Christin Fudge, MD Debridement: Debridement Pre-procedure Yes - 12:57 Verification/Time Out Taken: Start Time: 12:57 Pain Control: Lidocaine 4% Topical Solution Level: Skin/Subcutaneous Tissue Total Area Debrided (L x 0.7 (cm) x 0.3 (cm) = 0.21 (cm) W): Tissue and other Viable, Non-Viable, Fibrin/Slough, Subcutaneous material debrided: Instrument: Forceps Bleeding: Minimum Hemostasis Achieved: Pressure End Time: 12:59 Procedural Pain: 0 Post Procedural Pain: 0 Response to Treatment: Procedure was tolerated well Post Debridement Measurements of Total Wound Length: (cm)  0.7 Width: (cm) 0.3 Depth: (cm) 0.1 Volume: (cm) 0.016 Character of Wound/Ulcer Post Improved Debridement: Severity of Tissue Post Debridement: Fat layer exposed Post Procedure Diagnosis Same as Pre-procedure Electronic Signature(s) Signed: 09/04/2016 2:19:05 PM By: Christin Fudge MD, FACS Signed: 09/04/2016 5:23:46 PM By: Montey Hora Previous Signature: 09/04/2016 2:18:43 PM Version By: Christin Fudge MD, FACS Entered By: Christin Fudge on 09/04/2016 14:19:05 [name] [name] ([id number]) [name] [name], [name] [name] ([id number]) -------------------------------------------------------------------------------- HPI Details Patient Name: [name] [name]. Date of Service: 09/04/2016 12:30 PM Medical Record Number: [id number] Patient Account Number: [id number] Date of Birth/Sex: February 11, 1976 (41 y.o. Male) Treating RN: Montey Hora Primary Care Provider: Evern Bio Other Clinician: Referring Provider: Evern Bio Treating Provider/Extender: Frann Rider in Treatment: 13 History of Present Illness Location: left lower extremity Quality: Patient reports experiencing a dull pain to affected area(s). Severity: Patient states wound (s) are getting better. Duration: Patient has had the wound for < 2 weeks prior to presenting for treatment Timing: Pain in wound is Intermittent (comes and goes Context: The wound appeared gradually over time Modifying Factors: Other treatment(s) tried include:local care and oral antibiotic Associated Signs and Symptoms: Patient reports having increase swelling. HPI Description: 41 year old gentleman who has noticed a blister on his left lower extremity which came up 5 days ago and it has drained fluid. He was seen by hispractitioner who reviewed him and put him on doxycycline now asked him to come to the wound center. His past medical history is significant for diabetes mellitus type 2, congestive heart failure, renal failure, hypertension, and he is  receiving hemodialysis 3 times a week. He has never been a smoker.Marland Kitchen 06/12/2016 -- the patient has in continue with hemodialysis and no changes have occurred in the last week. 07/03/2016 -- the patient has been doing his dressings only every other day and has not been washing was scrubbing the wound as advised 07/10/2016 -- the patient did not schedule his venous duplex study for reflux. He is agreeable about getting a wound biopsy done today. 07/24/2016 -- pathology report --  DIAGNOSIS: A. 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 - NEGATIVE FOR MALIGNANCY -- venous study done on 07/22/2016 shows venous incompetence in the bilateral great saphenous vein as well as the deep venous system. No evidence of deep or superficial venous thrombosis in bilateral lower extremity. 07/31/2016 -- the patient has a appointment to see the surgeons at [place] next month 08/07/2016 -- for various reasons the patient could not have his dressing changed 3 times a week and he left his compression on for 2 days and removed it and has been doing his own local care as decided by him. The patient is noncompliant. Electronic Signature(s) Signed: 09/04/2016 2:19:24 PM By: Christin Fudge MD, FACS Entered By: Christin Fudge on 09/04/2016 14:19:23 [name] [name] ([id number]) -------------------------------------------------------------------------------- Physical Exam Details Patient Name: [name] [name]. Date of Service: 09/04/2016 12:30 PM Medical Record Number: [id number] Patient Account Number: [id number] Date of Birth/Sex: 07/25/1975 (41 y.o. Male) Treating RN: Montey Hora Primary Care Provider: Evern Bio Other Clinician: Referring Provider: Evern Bio Treating Provider/Extender: Frann Rider in Treatment: 13 Constitutional . Pulse regular. Respirations normal and unlabored. Afebrile. . Eyes Nonicteric.  Reactive to light. Ears, Nose, Mouth, and Throat Lips, teeth, and gums WNL.Marland Kitchen Moist mucosa without lesions. Neck supple and nontender. No palpable supraclavicular or cervical adenopathy. Normal sized without goiter. Respiratory WNL. No retractions.. Breath sounds WNL, No rubs, rales, rhonchi, or wheeze.. Cardiovascular Heart rhythm and rate regular, no murmur or gallop.. Pedal Pulses WNL. No clubbing, cyanosis or edema. Lymphatic No adneopathy. No adenopathy. No adenopathy. Musculoskeletal Adexa without tenderness or enlargement.. Digits and nails w/o clubbing, cyanosis, infection, petechiae, ischemia, or inflammatory conditions.. Integumentary (Hair, Skin) No suspicious lesions. No crepitus or fluctuance. No peri-wound warmth or erythema. No masses.Marland Kitchen Psychiatric Judgement and insight Intact.. No evidence of depression, anxiety, or agitation.. Notes there has been excellent improvement over this last week and now there is only about a 3 mm area which is open Electronic Signature(s) Signed: 09/04/2016 2:19:52 PM By: Christin Fudge MD, FACS Entered By: Christin Fudge on 09/04/2016 14:19:51 [name] [name] ([id number]) -------------------------------------------------------------------------------- Physician Orders Details Patient Name: [name] [name]. Date of Service: 09/04/2016 12:30 PM Medical Record Number: [id number] Patient Account Number: [id number] Date of Birth/Sex: 11/16/1975 (41 y.o. Male) Treating RN: Montey Hora Primary Care Provider: Evern Bio Other Clinician: Referring Provider: Evern Bio Treating Provider/Extender: Frann Rider in Treatment: 13 Verbal / Phone Orders: No Diagnosis Coding Wound Cleansing Wound #1 Left,Medial Lower Leg o Clean wound with Normal Saline. Anesthetic Wound #1 Left,Medial Lower Leg o Topical Lidocaine 4% cream applied to wound bed prior to debridement Primary Wound Dressing Wound #1 Left,Medial Lower Leg o  Hydrafera Blue Secondary Dressing Wound #1 Left,Medial Lower Leg o ABD pad - secure with tape Dressing Change Frequency Wound #1 Left,Medial Lower Leg o Change dressing every other day. Follow-up Appointments Wound #1 Left,Medial Lower Leg o Return Appointment in 1 week. Edema Control Wound #1 Left,Medial Lower Leg o Patient to wear own Juxtalite/Juzo compression garment. - wear at al times during the day and take off at night and elevate your legs at night Electronic Signature(s) Signed: 09/04/2016 4:13:28 PM By: Christin Fudge MD, FACS Signed: 09/04/2016 5:23:46 PM By: Montey Hora Entered By: Montey Hora on 09/04/2016 12:59:57 [name] [name] ([id number]) [name] [name], [name] [name] ([id number]) -------------------------------------------------------------------------------- Problem List Details Patient Name: [name] [name], [name] [name]. Date of Service: 09/04/2016 12:30 PM Medical Record Number: [id number] Patient Account  Number: [id number] Date of Birth/Sex: January 12, 1976 (41 y.o. 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 Date of Service: 09/04/2016 12:30  PM Medical Record Number: [id number] Patient Account Number: [id number] Date of Birth/Sex: [birth year] (41 y.o. Male) Treating RN: Montey Hora Primary Care Provider: Evern Bio Other Clinician: Referring Provider: Evern Bio Treating Provider/Extender: Frann Rider in Treatment: 13 Subjective Chief Complaint Information obtained from Patient Patients presents for treatment of an open diabetic ulcer the left lower extremity for 5 days History of Present Illness (HPI) The following HPI elements were documented for the patient's wound: Location: left lower extremity Quality: Patient reports experiencing a dull pain to affected area(s). Severity: Patient states wound (s) are getting better. Duration: Patient has had the wound for < 2 weeks prior to presenting for treatment Timing: Pain in wound is Intermittent (comes and goes Context: The wound appeared gradually over time Modifying Factors: Other treatment(s) tried include:local care and oral antibiotic Associated Signs and Symptoms: Patient reports having increase swelling. 41 year old gentleman who has noticed a blister on his left lower extremity which came up 5 days ago and it has drained fluid. He was seen by hispractitioner who reviewed him and put him on doxycycline now asked him to come to the wound center. His past medical history is significant for diabetes mellitus type 2, congestive heart failure, renal failure, hypertension, and he is receiving hemodialysis 3 times a week. He has never been a smoker.Marland Kitchen 06/12/2016 -- the patient has in continue with hemodialysis and no changes have occurred in the last week. 07/03/2016 -- the patient has been doing his dressings only every other day and has not been washing was scrubbing the wound as advised 07/10/2016 -- the patient did not schedule his venous duplex study for reflux. He is agreeable about getting a wound biopsy done today. 07/24/2016 -- pathology report  -- DIAGNOSIS: A. 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 - NEGATIVE FOR MALIGNANCY -- venous study done on 07/22/2016 shows venous incompetence in the bilateral great saphenous vein as well as the deep venous system. No evidence of deep or superficial venous thrombosis in bilateral lower extremity. 07/31/2016 -- the patient has a appointment to see the surgeons at [place] next month 08/07/2016 -- for various reasons the patient could not have his dressing changed 3 times a week and he left his compression on for 2 days and removed it and has been doing his own local care as decided by him. The patient is noncompliant. [name] [name], [name] [name] D. ([id number]) Objective Constitutional Pulse regular. Respirations normal and unlabored. Afebrile. Vitals Time Taken: 12:42 PM, Height: 73 in, Weight: 285 lbs, BMI: 37.6, Temperature: 98.4 F, Pulse: 82 bpm, Respiratory Rate: 18 breaths/min, Blood Pressure: 113/62 mmHg. Eyes Nonicteric. Reactive to light. Ears, Nose, Mouth, and Throat Lips, teeth, and gums WNL.Marland Kitchen Moist mucosa without  lesions. Neck supple and nontender. No palpable supraclavicular or cervical adenopathy. Normal sized without goiter. Respiratory WNL. No retractions.. Breath sounds WNL, No rubs, rales, rhonchi, or wheeze.. Cardiovascular Heart rhythm and rate regular, no murmur or gallop.. Pedal Pulses WNL. No clubbing, cyanosis or edema. Lymphatic No adneopathy. No adenopathy. No adenopathy. Musculoskeletal Adexa without tenderness or enlargement.. Digits and nails w/o clubbing, cyanosis, infection, petechiae, ischemia, or inflammatory conditions.Marland Kitchen Psychiatric Judgement and insight Intact.. No evidence of depression, anxiety, or agitation.. General Notes: there has been excellent improvement over this last week and now there is only about a 3 mm area which is open Integumentary (Hair, Skin) No suspicious  lesions. No crepitus or fluctuance. No peri-wound warmth or erythema. No masses.. Wound #1 status is Open. Original cause of wound was Blister. The wound is located on the Left,Medial Lower Leg. The wound measures 0.7cm length x 0.3cm width x 0.1cm depth; 0.165cm^2 area and 0.016cm^3 volume. The wound is limited to skin breakdown. There is no tunneling or undermining noted. There is a large amount of serosanguineous drainage noted. The wound margin is flat and intact. There is large (67-100%) red granulation within the wound bed. There is no necrotic tissue within the wound bed. The periwound skin appearance exhibited: Erythema. The periwound skin appearance did not exhibit: [name] [name], [name] [name] ([id number]) Callus, Crepitus, Excoriation, Induration, Rash, Scarring, Dry/Scaly, Maceration, Atrophie Blanche, Cyanosis, Ecchymosis, Hemosiderin Staining, Mottled, Pallor, Rubor. The surrounding wound skin color is noted with erythema which is circumferential. Periwound temperature was noted as No Abnormality. The periwound has tenderness on palpation. 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 There was a Skin/Subcutaneous Tissue Debridement ([id number]) debridement with total area of 0.21 sq cm performed by Christin Fudge, MD. with the following instrument(s): Forceps to remove Viable and Non-Viable tissue/material including Fibrin/Slough and Subcutaneous after achieving pain control using Lidocaine 4% Topical Solution. A time out was conducted at 12:57, prior to the start of the procedure. A Minimum amount of bleeding was controlled with Pressure. The procedure  was tolerated well with a pain level of 0 throughout and a pain level of 0 following the procedure. Post Debridement Measurements: 0.7cm length x 0.3cm width x 0.1cm depth; 0.016cm^3 volume. Character of Wound/Ulcer Post Debridement is improved. Severity of Tissue Post Debridement is: Fat layer exposed. Post procedure Diagnosis Wound #1: Same as Pre-Procedure Plan Wound Cleansing: Wound #1 Left,Medial Lower Leg: Clean wound with Normal Saline. Anesthetic: [name] [name], [name] [name] ([id number]) Wound #1 Left,Medial Lower Leg: Topical Lidocaine 4% cream applied to wound bed prior to debridement Primary Wound Dressing: Wound #1 Left,Medial Lower Leg: Hydrafera Blue Secondary Dressing: Wound #1 Left,Medial Lower Leg: ABD pad - secure with tape Dressing Change Frequency: Wound #1 Left,Medial Lower Leg: Change dressing every other day. Follow-up Appointments: Wound #1 Left,Medial Lower Leg: Return Appointment in 1 week. Edema Control: Wound #1 Left,Medial Lower Leg: Patient to wear own Juxtalite/Juzo compression garment. - wear at al times during the day and take off at night and elevate your legs at night he is an very well over the last couple of weeks and after review today I have recommended: 1. Hydrofera blue and compression with juxta light  compression wraps. 2. Elevation and exercise 3. Good control of his diabetes mellitus 4. he says that he will be seeing [facility] later this month 5. Review next week in the wound center -- I anticipate discharge soon Electronic Signature(s) Signed: 09/04/2016 2:22:20 PM By: Christin Fudge MD, FACS Previous Signature: 09/04/2016 2:20:26 PM Version By: Christin Fudge MD, FACS Entered By: Christin Fudge on 09/04/2016 14:22:20 [name] [name] ([id number]) -------------------------------------------------------------------------------- SuperBill Details Patient Name: [name] [name]. Date of Service: 09/04/2016 Medical Record Number: [id number] Patient  Account Number: [id number] Date of Birth/Sex: Dec 17, 1975 (41 y.o. 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 Facility Procedures CPT4 Code: [id number] Description: 89169 - DEB SUBQ TISSUE 20 SQ CM/< ICD-10 Description Diagnosis E11.622 Type 2 diabetes mellitus with other skin ulcer L97.222 Non-pressure chronic ulcer of left calf with fat l I83.022 Varicose veins of left lower extremity with ulcer Modifier: ayer exposed of calf Quantity: 1 Physician Procedures CPT4 Code: [id number] Description: 11042 - WC PHYS SUBQ TISS 20 SQ CM ICD-10 Description Diagnosis E11.622 Type 2 diabetes mellitus with other skin ulcer L97.222 Non-pressure chronic ulcer of left calf with fat l I83.022 Varicose veins of left lower extremity with ulcer Modifier: ayer exposed of calf Quantity: 1 Electronic Signature(s) Signed: 09/04/2016 2:22:34 PM By: Christin Fudge MD, FACS Entered By: Christin Fudge on 09/04/2016 14:22:33

## 2016-09-11 ENCOUNTER — Encounter: Payer: Medicare Other | Admitting: Surgery

## 2016-09-11 DIAGNOSIS — E11622 Type 2 diabetes mellitus with other skin ulcer: Secondary | ICD-10-CM | POA: Diagnosis not present

## 2016-09-13 NOTE — Progress Notes (Signed)
EDGAR, REISZ ([id number]) Visit Report for 09/11/2016 Arrival Information Details Patient Name: [name] [name], [name] [name]. Date of Service: 09/11/2016 9:15 AM Medical Record Number: [id number] Patient Account Number: [id number] Date of Birth/Sex: Sep 08, 1975 (41 y.o. Male) Treating RN: Baruch Gouty, RN, BSN, Velva Harman Primary Care Dominyck Reser: Evern Bio Other Clinician: Referring Myron Stankovich: Evern Bio Treating Case Vassell/Extender: Frann Rider in Treatment: 14 Visit Information History Since Last Visit All ordered tests and consults were completed: No Patient Arrived: Ambulatory Added or deleted any medications: No Arrival Time: 09:10 Any new allergies or adverse reactions: No Accompanied By: self Had a fall or experienced change in No Transfer Assistance: None activities of daily living that may affect Patient Identification Verified: Yes risk of falls: Secondary Verification Process Yes Signs or symptoms of abuse/neglect since last No Completed: visito Patient Requires Transmission- No Hospitalized since last visit: No Based Precautions: Has Dressing in Place as Prescribed: Yes Patient Has Alerts: Yes Has Compression in Place as Prescribed: Yes Patient Alerts: Patient on Blood Pain Present Now: No Thinner Electronic Signature(s) Signed: 09/11/2016 4:57:34 PM By: Regan Lemming BSN, RN Entered By: Regan Lemming on 09/11/2016 09:11:06 [name] [name] ([id number]) -------------------------------------------------------------------------------- Clinic Level of Care Assessment Details Patient Name: [name] [name]. Date of Service: 09/11/2016 9:15 AM Medical Record Number: [id number] Patient Account Number: [id number] Date of Birth/Sex: 1975/07/30 (41 y.o. Male) Treating RN: Afful, RN, BSN, [place] Primary Care Michaela Broski: Evern Bio Other Clinician: Referring Trayshawn Durkin: Evern Bio Treating Luvada Salamone/Extender: Frann Rider in Treatment: 14 Clinic Level of Care Assessment  Items TOOL 4 Quantity Score []  - Use when only an EandM is performed on FOLLOW-UP visit 0 ASSESSMENTS - Nursing Assessment / Reassessment X - Reassessment of Co-morbidities (includes updates in patient status) 1 10 X - Reassessment of Adherence to Treatment Plan 1 5 ASSESSMENTS - Wound and Skin Assessment / Reassessment []  - Simple Wound Assessment / Reassessment - one wound 0 []  - Complex Wound Assessment / Reassessment - multiple wounds 0 []  - Dermatologic / Skin Assessment (not related to wound area) 0 ASSESSMENTS - Focused Assessment []  - Circumferential Edema Measurements - multi extremities 0 []  - Nutritional Assessment / Counseling / Intervention 0 X - Lower Extremity Assessment (monofilament, tuning fork, pulses) 1 5 []  - Peripheral Arterial Disease Assessment (using hand held doppler) 0 ASSESSMENTS - Ostomy and/or Continence Assessment and Care []  - Incontinence Assessment and Management 0 []  - Ostomy Care Assessment and Management (repouching, etc.) 0 PROCESS - Coordination of Care X - Simple Patient / Family Education for ongoing care 1 15 []  - Complex (extensive) Patient / Family Education for ongoing care 0 []  - Staff obtains Programmer, systems, Records, Test Results / Process Orders 0 []  - Staff telephones HHA, Nursing Homes / Clarify orders / etc 0 []  - Routine Transfer to another Facility (non-emergent condition) 0 [name] [name], [name] [name] ([id number]) []  - Routine Hospital Admission (non-emergent condition) 0 []  - New Admissions / Biomedical engineer / Ordering NPWT, Apligraf, etc. 0 []  - Emergency Hospital Admission (emergent condition) 0 []  - Simple Discharge Coordination 0 []  - Complex (extensive) Discharge Coordination 0 PROCESS - Special Needs []  - Pediatric / Minor Patient Management 0 []  - Isolation Patient Management 0 []  - Hearing / Language / Visual special needs 0 []  - Assessment of Community assistance (transportation, D/C planning, etc.) 0 []  - Additional  assistance / Altered mentation 0 []  - Support Surface(s) Assessment (bed, cushion, seat, etc.) 0 INTERVENTIONS - Wound Cleansing / Measurement []  - Simple Wound Cleansing - one  wound 0 []  - Complex Wound Cleansing - multiple wounds 0 X - Wound Imaging (photographs - any number of wounds) 1 5 []  - Wound Tracing (instead of photographs) 0 []  - Simple Wound Measurement - one wound 0 []  - Complex Wound Measurement - multiple wounds 0 INTERVENTIONS - Wound Dressings []  - Small Wound Dressing one or multiple wounds 0 []  - Medium Wound Dressing one or multiple wounds 0 []  - Large Wound Dressing one or multiple wounds 0 []  - Application of Medications - topical 0 []  - Application of Medications - injection 0 INTERVENTIONS - Miscellaneous []  - External ear exam 0 [name] [name], [name] [name] ([id number]) []  - Specimen Collection (cultures, biopsies, blood, body fluids, etc.) 0 []  - Specimen(s) / Culture(s) sent or taken to Lab for analysis 0 []  - Patient Transfer (multiple staff / Harrel Lemon Lift / Similar devices) 0 []  - Simple Staple / Suture removal (25 or less) 0 []  - Complex Staple / Suture removal (26 or more) 0 []  - Hypo / Hyperglycemic Management (close monitor of Blood Glucose) 0 []  - Ankle / Brachial Index (ABI) - do not check if billed separately 0 X - Vital Signs 1 5 Has the patient been seen at the hospital within the last three years: Yes Total Score: 45 Level Of Care: New/Established - Level 2 Electronic Signature(s) Signed: 09/11/2016 4:57:34 PM By: Regan Lemming BSN, RN Entered By: Regan Lemming on 09/11/2016 09:23:54 [name] [name] ([id number]) -------------------------------------------------------------------------------- Encounter Discharge Information Details Patient Name: [name] [name]. Date of Service: 09/11/2016 9:15 AM Medical Record Number: [id number] Patient Account Number: [id number] Date of Birth/Sex: 1975/05/13 (41 y.o. Male) Treating RN: Baruch Gouty, RN, BSN, Velva Harman Primary Care  Julee Stoll: Evern Bio Other Clinician: Referring Tishana Clinkenbeard: Evern Bio Treating Ronika Kelson/Extender: Frann Rider in Treatment: 14 Encounter Discharge Information Items Discharge Pain Level: 0 Discharge Condition: Stable Ambulatory Status: Ambulatory Discharge Destination: Home Transportation: Private Auto Accompanied By: self Schedule Follow-up Appointment: No Medication Reconciliation completed No and provided to Patient/Care Avangelina Flight: Patient Clinical Summary of Care: Declined Electronic Signature(s) Signed: 09/11/2016 4:57:34 PM By: Regan Lemming BSN, RN Previous Signature: 09/11/2016 9:33:18 AM Version By: Ruthine Dose Entered By: Regan Lemming on 09/11/2016 09:34:35 [name] [name] ([id number]) -------------------------------------------------------------------------------- Lower Extremity Assessment Details Patient Name: [name] [name]. Date of Service: 09/11/2016 9:15 AM Medical Record Number: [id number] Patient Account Number: [id number] Date of Birth/Sex: [birth year] (41 y.o. Male) Treating RN: Baruch Gouty, RN, BSN, Velva Harman Primary Care Ricky Gallery: Evern Bio Other Clinician: Referring Oluwatobi Visser: Evern Bio Treating Lyan Moyano/Extender: Frann Rider in Treatment: 14 Edema Assessment Assessed: [Left: No] [Right: No] Edema: [Left: N] [Right: o] Vascular Assessment Claudication: Claudication Assessment [Left:None] Pulses: Dorsalis Pedis Palpable: [Left:Yes] Posterior Tibial Extremity colors, hair growth, and conditions: Extremity Color: [Left:Dusky] Hair Growth on Extremity: [Left:Yes] Temperature of Extremity: [Left:Warm] Capillary Refill: [Left:< 3 seconds] Toe Nail Assessment Left: Right: Thick: Yes Discolored: No Deformed: No Improper Length and Hygiene: No Electronic Signature(s) Signed: 09/11/2016 4:57:34 PM By: Regan Lemming BSN, RN Entered By: Regan Lemming on 09/11/2016 09:11:37 [name] [name]  ([id number]) -------------------------------------------------------------------------------- Multi Wound Chart Details Patient Name: [name] [name]. Date of Service: 09/11/2016 9:15 AM Medical Record Number: [id number] Patient Account Number: [id number] Date of Birth/Sex: [birth year] (41 y.o. Male) Treating RN: Baruch Gouty, RN, BSN, Velva Harman Primary Care Ole Lafon: Evern Bio Other Clinician: Referring Jahrell Hamor: Evern Bio Treating Christopher Glasscock/Extender: Frann Rider in Treatment: 14 Vital Signs Height(in): 73 Pulse(bpm): 79 Weight(lbs): 285 Blood Pressure 105/65 (mmHg): Body Mass Index(BMI): 38 Temperature(F): 98.2 Respiratory Rate 17 (  breaths/min): Photos: [1:No Photos] [N/A:N/A] Wound Location: [1:Left Lower Leg - Medial] [N/A:N/A] Wounding Event: [1:Blister] [N/A:N/A] Primary Etiology: [1:Diabetic Wound/Ulcer of the Lower Extremity] [N/A:N/A] Secondary Etiology: [1:Venous Leg Ulcer] [N/A:N/A] Comorbid History: [1:Hypertension, Type II Diabetes] [N/A:N/A] Date Acquired: [1:06/03/2016] [N/A:N/A] Weeks of Treatment: [1:14] [N/A:N/A] Wound Status: [1:Open] [N/A:N/A] Measurements L x W x D 0x0x0 [N/A:N/A] (cm) Area (cm) : [1:0] [N/A:N/A] Volume (cm) : [1:0] [N/A:N/A] % Reduction in Area: [1:100.00%] [N/A:N/A] % Reduction in Volume: 100.00% [N/A:N/A] Classification: [1:Grade 1] [N/A:N/A] Exudate Amount: [1:None Present] [N/A:N/A] Wound Margin: [1:Flat and Intact] [N/A:N/A] Granulation Amount: [1:Large (67-100%)] [N/A:N/A] Granulation Quality: [1:Friable] [N/A:N/A] Necrotic Amount: [1:None Present (0%)] [N/A:N/A] Exposed Structures: [1:Fascia: No Fat Layer (Subcutaneous Tissue) Exposed: No Tendon: No Muscle: No Joint: No Bone: No] [N/A:N/A] Limited to Skin Breakdown Epithelialization: Large (67-100%) N/A N/A Periwound Skin Texture: Excoriation: No N/A N/A Induration: No Callus: No Crepitus: No Rash: No Scarring: No Periwound Skin Dry/Scaly: Yes N/A  N/A Moisture: Maceration: No Periwound Skin Color: Hemosiderin Staining: Yes N/A N/A Atrophie Blanche: No Cyanosis: No Ecchymosis: No Erythema: No Mottled: No Pallor: No Rubor: No Temperature: No Abnormality N/A N/A Tenderness on No N/A N/A Palpation: Wound Preparation: Ulcer Cleansing: N/A N/A Rinsed/Irrigated with Saline Topical Anesthetic Applied: None Treatment Notes Electronic Signature(s) Signed: 09/11/2016 9:34:49 AM By: Christin Fudge MD, FACS Entered By: Christin Fudge on 09/11/2016 09:34:49 [name] [name] ([id number]) -------------------------------------------------------------------------------- Multi-Disciplinary Care Plan Details Patient Name: [name] [name]. Date of Service: 09/11/2016 9:15 AM Medical Record Number: [id number] Patient Account Number: [id number] Date of Birth/Sex: 18-Mar-1976 (41 y.o. Male) Treating RN: Baruch Gouty, RN, BSN, Velva Harman Primary Care Claudine Stallings: Evern Bio Other Clinician: Referring Mitchelle Sultan: Evern Bio Treating Larissa Pegg/Extender: Frann Rider in Treatment: 14 Active Inactive Electronic Signature(s) Signed: 09/11/2016 4:57:34 PM By: Regan Lemming BSN, RN Entered By: Regan Lemming on 09/11/2016 09:22:44 [name] [name] ([id number]) -------------------------------------------------------------------------------- Pain Assessment Details Patient Name: [name] [name]. Date of Service: 09/11/2016 9:15 AM Medical Record Number: [id number] Patient Account Number: [id number] Date of Birth/Sex: [birth year] (41 y.o. Male) Treating RN: Baruch Gouty, RN, BSN, Velva Harman Primary Care Cala Kruckenberg: Evern Bio Other Clinician: Referring Zofia Peckinpaugh: Evern Bio Treating Jimel Myler/Extender: Frann Rider in Treatment: 14 Active Problems Location of Pain Severity and Description of Pain Patient Has Paino No Site Locations With Dressing Change: No Pain Management and Medication Current Pain Management: Electronic Signature(s) Signed:  09/11/2016 4:57:34 PM By: Regan Lemming BSN, RN Entered By: Regan Lemming on 09/11/2016 [name] [name] ([id number]) -------------------------------------------------------------------------------- Patient/Caregiver Education Details Patient Name: [name] [name]. Date of Service: 09/11/2016 9:15 AM Medical Record Number: [id number] Patient Account Number: [id number] Date of Birth/Gender: August 23, 1975 (41 y.o. Male) Treating RN: Baruch Gouty, RN, BSN, Velva Harman Primary Care Physician: Evern Bio Other Clinician: Referring Physician: Evern Bio Treating Physician/Extender: Frann Rider in Treatment: 14 Education Assessment Education Provided To: Patient Education Topics Provided Electronic Signature(s) Signed: 09/11/2016 4:57:34 PM By: Regan Lemming BSN, RN Entered By: Regan Lemming on 09/11/2016 09:34:43 [name] [name] ([id number]) -------------------------------------------------------------------------------- Wound Assessment Details Patient Name: [name] [name]. Date of Service: 09/11/2016 9:15 AM Medical Record Number: [id number] Patient Account Number: [id number] Date of Birth/Sex: 11-Jul-1975 (41 y.o. Male) Treating RN: Baruch Gouty, RN, BSN, Velva Harman Primary Care Amaree Loisel: Evern Bio Other Clinician: Referring Breckon Reeves: Evern Bio Treating Zaylin Pistilli/Extender: Frann Rider in Treatment: 14 Wound Status Wound Number: 1 Primary Etiology: Diabetic Wound/Ulcer of the Lower Extremity Wound Location: Left Lower Leg - Medial Secondary Venous Leg Ulcer Wounding Event: Blister Etiology: Date Acquired: 06/03/2016 Wound Status: Open Weeks  Of Treatment: 14 Comorbid Hypertension, Type II Diabetes Clustered Wound: No History: Photos Photo Uploaded By: Regan Lemming on 09/11/2016 15:44:33 Wound Measurements Length: (cm) 0 % Reduction in Width: (cm) 0 % Reduction in Depth: (cm) 0 Epithelializat Area: (cm) 0 Tunneling: Volume: (cm) 0 Undermining: Area:  100% Volume: 100% ion: Large (67-100%) No No Wound Description Classification: Grade 1 Wound Margin: Flat and Intact Exudate Amount: None Present Foul Odor After Cleansing: No Slough/Fibrino No Wound Bed Granulation Amount: Large (67-100%) Exposed Structure Granulation Quality: Friable Fascia Exposed: No Necrotic Amount: None Present (0%) Fat Layer (Subcutaneous Tissue) Exposed: No Tendon Exposed: No Muscle Exposed: No Joint Exposed: No [name] [name], [name] [name] ([id number]) Bone Exposed: No Limited to Skin Breakdown Periwound Skin Texture Texture Color No Abnormalities Noted: No No Abnormalities Noted: No Callus: No Atrophie Blanche: No Crepitus: No Cyanosis: No Excoriation: No Ecchymosis: No Induration: No Erythema: No Rash: No Hemosiderin Staining: Yes Scarring: No Mottled: No Pallor: No Moisture Rubor: No No Abnormalities Noted: No Dry / Scaly: Yes Temperature / Pain Maceration: No Temperature: No Abnormality Wound Preparation Ulcer Cleansing: Rinsed/Irrigated with Saline Topical Anesthetic Applied: None Electronic Signature(s) Signed: 09/11/2016 4:57:34 PM By: Regan Lemming BSN, RN Entered By: Regan Lemming on 09/11/2016 09:22:08 [name] [name] ([id number]) -------------------------------------------------------------------------------- Vitals Details Patient Name: [name] [name]. Date of Service: 09/11/2016 9:15 AM Medical Record Number: [id number] Patient Account Number: [id number] Date of Birth/Sex: [birth year] (41 y.o. Male) Treating RN: Afful, RN, BSN, [place] Primary Care Makaila Windle: Evern Bio Other Clinician: Referring Kalan Yeley: Evern Bio Treating Pearson Picou/Extender: Frann Rider in Treatment: 14 Vital Signs Time Taken: 09:12 Temperature (F): 98.2 Height (in): 73 Pulse (bpm): 79 Weight (lbs): 285 Respiratory Rate (breaths/min): 17 Body Mass Index (BMI): 37.6 Blood Pressure (mmHg): 105/65 Reference Range: 80 - 120 mg /  dl Electronic Signature(s) Signed: 09/11/2016 4:57:34 PM By: Regan Lemming BSN, RN Entered By: Regan Lemming on 09/11/2016 09:14:04

## 2016-09-13 NOTE — Progress Notes (Signed)
EMRICK, HENSCH ([id number]) Visit Report for 09/11/2016 Chief Complaint Document Details Patient Name: [name] [name], [name] [name]. Date of Service: 09/11/2016 9:15 AM Medical Record Number: [id number] Patient Account Number: [id number] Date of Birth/Sex: [birth year] (41 y.o. Male) Treating RN: Baruch Gouty, RN, BSN, Velva Harman Primary Care Provider: Evern Bio Other Clinician: Referring Provider: Evern Bio Treating Provider/Extender: Frann Rider in Treatment: 14 Information Obtained from: Patient Chief Complaint Patients presents for treatment of an open diabetic ulcer the left lower extremity for 5 days Electronic Signature(s) Signed: 09/11/2016 9:34:55 AM By: Christin Fudge MD, FACS Entered By: Christin Fudge on 09/11/2016 09:34:55 [name] [name] ([id number]) -------------------------------------------------------------------------------- HPI Details Patient Name: [name] [name]. Date of Service: 09/11/2016 9:15 AM Medical Record Number: [id number] Patient Account Number: [id number] Date of Birth/Sex: 14-Jan-1976 (41 y.o. Male) Treating RN: Baruch Gouty, RN, BSN, Velva Harman Primary Care Provider: Evern Bio Other Clinician: Referring Provider: Evern Bio Treating Provider/Extender: Frann Rider in Treatment: 14 History of Present Illness Location: left lower extremity Quality: Patient reports experiencing a dull pain to affected area(s). Severity: Patient states wound (s) are getting better. Duration: Patient has had the wound for < 2 weeks prior to presenting for treatment Timing: Pain in wound is Intermittent (comes and goes Context: The wound appeared gradually over time Modifying Factors: Other treatment(s) tried include:local care and oral antibiotic Associated Signs and Symptoms: Patient reports having increase swelling. HPI Description: 41 year old gentleman who has noticed a blister on his left lower extremity which came up 5 days ago and it has drained fluid. He was  seen by hispractitioner who reviewed him and put him on doxycycline now asked him to come to the wound center. His past medical history is significant for diabetes mellitus type 2, congestive heart failure, renal failure, hypertension, and he is receiving hemodialysis 3 times a week. He has never been a smoker.Marland Kitchen 06/12/2016 -- the patient has in continue with hemodialysis and no changes have occurred in the last week. 07/03/2016 -- the patient has been doing his dressings only every other day and has not been washing was scrubbing the wound as advised 07/10/2016 -- the patient did not schedule his venous duplex study for reflux. He is agreeable about getting a wound biopsy done today. 07/24/2016 -- pathology report -- DIAGNOSIS: A. 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 - NEGATIVE FOR MALIGNANCY -- venous study done on 07/22/2016 shows venous incompetence in the bilateral great saphenous vein as well as the deep venous system. No evidence of deep or superficial venous thrombosis in bilateral lower extremity. 07/31/2016 -- the patient has a appointment to see the surgeons at [place] next month 08/07/2016 -- for various reasons the patient could not have his dressing changed 3 times a week and he left his compression on for 2 days and removed it and has been doing his own local care as decided by him. The patient is noncompliant. Electronic Signature(s) Signed: 09/11/2016 9:35:00 AM By: Christin Fudge MD, FACS Entered By: Christin Fudge on 09/11/2016 09:34:59 [name] [name] ([id number]) -------------------------------------------------------------------------------- Physical Exam Details Patient Name: [name] [name]. Date of Service: 09/11/2016 9:15 AM Medical Record Number: [id number] Patient Account Number: [id number] Date of Birth/Sex: 25-Sep-1975 (41 y.o. Male) Treating RN: Baruch Gouty, RN, BSN, Velva Harman Primary  Care Provider: Evern Bio Other Clinician: Referring Provider: Evern Bio Treating Provider/Extender: Frann Rider in Treatment: 14 Constitutional . Pulse regular. Respirations normal and unlabored. Afebrile. . Eyes Nonicteric. Reactive to light. Ears,  Nose, Mouth, and Throat Lips, teeth, and gums WNL.Marland Kitchen Moist mucosa without lesions. Neck supple and nontender. No palpable supraclavicular or cervical adenopathy. Normal sized without goiter. Respiratory WNL. No retractions.. Breath sounds WNL, No rubs, rales, rhonchi, or wheeze.. Cardiovascular Heart rhythm and rate regular, no murmur or gallop.. Pedal Pulses WNL. No clubbing, cyanosis or edema. Lymphatic No adneopathy. No adenopathy. No adenopathy. Musculoskeletal Adexa without tenderness or enlargement.. Digits and nails w/o clubbing, cyanosis, infection, petechiae, ischemia, or inflammatory conditions.. Integumentary (Hair, Skin) No suspicious lesions. No crepitus or fluctuance. No peri-wound warmth or erythema. No masses.Marland Kitchen Psychiatric Judgement and insight Intact.. No evidence of depression, anxiety, or agitation.. Notes wound is healed. Electronic Signature(s) Signed: 09/11/2016 9:35:16 AM By: Christin Fudge MD, FACS Entered By: Christin Fudge on 09/11/2016 09:35:16 [name] [name] ([id number]) -------------------------------------------------------------------------------- Physician Orders Details Patient Name: [name] [name]. Date of Service: 09/11/2016 9:15 AM Medical Record Number: [id number] Patient Account Number: [id number] Date of Birth/Sex: May 27, 1975 (41 y.o. Male) Treating RN: Baruch Gouty, RN, BSN, Velva Harman Primary Care Provider: Evern Bio Other Clinician: Referring Provider: Evern Bio Treating Provider/Extender: Frann Rider in Treatment: 14 Verbal / Phone Orders: No Diagnosis Coding Edema Control o Patient to wear own compression stockings o Patient to wear own Juxtalite/Juzo  compression garment. o Elevate legs to the level of the heart and pump ankles as often as possible Discharge From [facility] o Discharge from [place] Completed Electronic Signature(s) Signed: 09/11/2016 4:13:25 PM By: Christin Fudge MD, FACS Signed: 09/11/2016 4:57:34 PM By: Regan Lemming BSN, RN Entered By: Regan Lemming on 09/11/2016 09:23:31 [name] [name] ([id number]) -------------------------------------------------------------------------------- Problem List Details Patient Name: [name] [name]. Date of Service: 09/11/2016 9:15 AM Medical Record Number: [id number] Patient Account Number: [id number] Date of Birth/Sex: [birth year] (41 y.o. 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 Date of Service: 09/11/2016 9:15 AM Medical Record Number: [id number] Patient Account Number: [id number] Date of Birth/Sex: Dec 15, 1975 (41 y.o. Male) Treating RN: Baruch Gouty, RN, BSN, Velva Harman Primary Care Provider: Evern Bio Other Clinician: Referring Provider: Evern Bio Treating Provider/Extender: Frann Rider in Treatment:  14 Subjective Chief Complaint Information obtained from Patient Patients presents for treatment of an open diabetic ulcer the left lower extremity for 5 days History of Present Illness (HPI) The following HPI elements were documented for the patient's wound: Location: left lower extremity Quality: Patient reports experiencing a dull pain to affected area(s). Severity: Patient states wound (s) are getting better. Duration: Patient has had the wound for < 2 weeks prior to presenting for treatment Timing: Pain in wound is Intermittent (comes and goes Context: The wound appeared gradually over time Modifying Factors: Other treatment(s) tried include:local care and oral antibiotic Associated Signs and Symptoms: Patient reports having increase swelling. 41 year old gentleman who has noticed a blister on his left lower extremity which came up 5 days ago and it has drained  fluid. He was seen by hispractitioner who reviewed him and put him on doxycycline now asked him to come to the wound center. His past medical history is significant for diabetes mellitus type 2, congestive heart failure, renal failure, hypertension, and he is receiving hemodialysis 3 times a week. He has never been a smoker.Marland Kitchen 06/12/2016 -- the patient has in continue with hemodialysis and no changes have occurred in the last week. 07/03/2016 -- the patient has been doing his dressings only every other day and has not been washing was scrubbing the wound as advised 07/10/2016 -- the patient did not schedule his venous duplex study for reflux. He is agreeable about getting a wound biopsy done today. 07/24/2016 -- pathology report -- DIAGNOSIS: A. 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 - NEGATIVE FOR MALIGNANCY -- venous study done on 07/22/2016 shows venous incompetence in the bilateral great saphenous vein as well as the deep venous system. No  evidence of deep or superficial venous thrombosis in bilateral lower extremity. 07/31/2016 -- the patient has a appointment to see the surgeons at [place] next month 08/07/2016 -- for various reasons the patient could not have his dressing changed 3 times a week and he left his compression on for 2 days and removed it and has been doing his own local care as decided by him. The patient is noncompliant. [name] [name], [name] D. ([id number]) Objective Constitutional Pulse regular. Respirations normal and unlabored. Afebrile. Vitals Time Taken: 9:12 AM, Height: 73 in, Weight: 285 lbs, BMI: 37.6, Temperature: 98.2 F, Pulse: 79 bpm, Respiratory Rate: 17 breaths/min, Blood Pressure: 105/65 mmHg. Eyes Nonicteric. Reactive to light. Ears, Nose, Mouth, and Throat Lips, teeth, and gums WNL.Marland Kitchen Moist mucosa without lesions. Neck supple and nontender. No palpable supraclavicular or cervical adenopathy. Normal sized without goiter. Respiratory WNL. No retractions.. Breath sounds WNL, No rubs, rales, rhonchi, or wheeze.. Cardiovascular Heart rhythm and rate regular, no murmur or gallop.. Pedal Pulses WNL. No clubbing, cyanosis or edema. Lymphatic No adneopathy. No adenopathy. No adenopathy. Musculoskeletal Adexa without tenderness or enlargement.. Digits and nails w/o clubbing, cyanosis, infection, petechiae, ischemia, or inflammatory conditions.Marland Kitchen Psychiatric Judgement and insight Intact.. No evidence of depression, anxiety, or agitation.. General Notes: wound is healed. Integumentary (Hair, Skin) No suspicious lesions. No crepitus or fluctuance. No peri-wound warmth or erythema. No masses.. Wound #1 status is Open. Original cause of wound was Blister. The wound is located on the Left,Medial Lower Leg. The wound measures 0cm length x 0cm width x 0cm depth; 0cm^2 area and 0cm^3 volume. The wound is limited to skin breakdown. There is no tunneling or undermining noted. There is a none present amount of  drainage noted. The wound margin is flat and intact. There is large (67-100%) friable granulation within the wound bed. There is no necrotic tissue within the wound bed. The periwound skin appearance exhibited: Dry/Scaly, Hemosiderin Staining. The periwound skin appearance did not exhibit: Callus, Crepitus, Excoriation, Induration, Rash, Scarring, Maceration, Atrophie Macie Burows, Cyanosis, PARTICK, MUSSELMAN. ([id number]) Ecchymosis, Mottled, Pallor, Rubor, Erythema. Periwound temperature was noted as No Abnormality. 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 Elevate legs to the level  of the heart and pump ankles as often as possible Discharge From [facility] Services: Discharge from [place] Completed his leg is looking very good and the wound is completely healed. I have discharged him from the wound care services with explicit instructions regarding protecting the wound continuing to wear his compression stockings and to follow-up with his vascular surgeon regarding endovenous ablation. The patient had several questions which were answered and he seems to understand the treatment plan Electronic Signature(s) Signed: 09/11/2016 9:35:58 AM By: Christin Fudge MD, FACS Entered By: Christin Fudge on 09/11/2016 09:35:58 [name] [name] ([id number]) -------------------------------------------------------------------------------- SuperBill Details Patient Name: [name] [name]. Date of Service: 09/11/2016 Medical Record Number: [id number] Patient Account Number: [id number] Date of Birth/Sex: 1975/08/11 (41 y.o. 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 Facility Procedures CPT4 Code: 24825003 Description: 70488 - WOUND CARE VISIT-LEV 2 EST PT Modifier: Quantity: 1 Physician Procedures CPT4 Code: 8916945 Description: 03888 - WC PHYS LEVEL 2 - EST PT ICD-10 Description Diagnosis E11.622 Type 2 diabetes mellitus with other skin ulcer Z99.2 Dependence on renal dialysis L97.222 Non-pressure chronic ulcer of left calf with fat I83.022 Varicose veins of left  lower extremity with ulcer Modifier: layer exposed of calf Quantity: 1 Electronic Signature(s) Signed: 09/11/2016 9:36:10 AM By: Christin Fudge MD, FACS Entered By: Christin Fudge on 09/11/2016 09:36:10

## 2016-09-16 ENCOUNTER — Ambulatory Visit (INDEPENDENT_AMBULATORY_CARE_PROVIDER_SITE_OTHER): Payer: Medicare Other | Admitting: Vascular Surgery

## 2016-09-16 ENCOUNTER — Encounter (INDEPENDENT_AMBULATORY_CARE_PROVIDER_SITE_OTHER): Payer: Self-pay | Admitting: Vascular Surgery

## 2016-09-16 VITALS — BP 136/86 | HR 82 | Resp 17 | Ht 75.0 in | Wt 290.0 lb

## 2016-09-16 DIAGNOSIS — L97929 Non-pressure chronic ulcer of unspecified part of left lower leg with unspecified severity: Secondary | ICD-10-CM | POA: Diagnosis not present

## 2016-09-16 DIAGNOSIS — I83029 Varicose veins of left lower extremity with ulcer of unspecified site: Secondary | ICD-10-CM

## 2016-09-16 DIAGNOSIS — I83028 Varicose veins of left lower extremity with ulcer other part of lower leg: Secondary | ICD-10-CM | POA: Diagnosis not present

## 2016-09-16 NOTE — Progress Notes (Signed)
No problem-specific Assessment & Plan notes found for this encounter.   The patient's left lower extremity was sterilely prepped and draped. The ultrasound machine was used to visualize the saphenous vein throughout its course. A segment just above the knee was selected for access after an attempt below the knee was unsuccessful with patient motion and subsequent vessel spasm. The saphenous vein was accessed without difficulty above the knee using ultrasound guidance with a micro puncture needle. A micro puncture wire and sheath were then placed. A 0.018 wire was placed beyond the saphenofemoral junction through the sheath and the micro puncture sheath was removed. The 65 cm sheath was then placed over the wire and the wire and dilator were removed. The laser fiber was placed through the sheath and its tip was placed approximately 5 cm below the saphenofemoral junction. Tumescent anesthesia was then created with a dilute lidocaine solution. Laser energy was then delivered with constant withdrawal of the sheath and laser fiber. Approximately 1068 Joules of energy were delivered over a length of 29 cm using the 1470 Hz Vena[employer]ure machine at [employer]. Sterile dressings were placed. The patient tolerated the procedure well without complications.

## 2016-09-23 ENCOUNTER — Ambulatory Visit (INDEPENDENT_AMBULATORY_CARE_PROVIDER_SITE_OTHER): Payer: Medicare Other

## 2016-09-23 DIAGNOSIS — I83028 Varicose veins of left lower extremity with ulcer other part of lower leg: Secondary | ICD-10-CM | POA: Diagnosis not present

## 2016-09-23 DIAGNOSIS — I83029 Varicose veins of left lower extremity with ulcer of unspecified site: Secondary | ICD-10-CM

## 2016-09-23 DIAGNOSIS — L97929 Non-pressure chronic ulcer of unspecified part of left lower leg with unspecified severity: Secondary | ICD-10-CM

## 2016-10-21 ENCOUNTER — Encounter (INDEPENDENT_AMBULATORY_CARE_PROVIDER_SITE_OTHER): Payer: Self-pay | Admitting: Vascular Surgery

## 2016-10-21 ENCOUNTER — Ambulatory Visit (INDEPENDENT_AMBULATORY_CARE_PROVIDER_SITE_OTHER): Payer: Medicare Other | Admitting: Vascular Surgery

## 2016-10-21 VITALS — BP 125/76 | HR 83 | Resp 15 | Ht 73.0 in | Wt 287.0 lb

## 2016-10-21 DIAGNOSIS — I83029 Varicose veins of left lower extremity with ulcer of unspecified site: Secondary | ICD-10-CM

## 2016-10-21 DIAGNOSIS — Z992 Dependence on renal dialysis: Secondary | ICD-10-CM | POA: Diagnosis not present

## 2016-10-21 DIAGNOSIS — E1122 Type 2 diabetes mellitus with diabetic chronic kidney disease: Secondary | ICD-10-CM

## 2016-10-21 DIAGNOSIS — I10 Essential (primary) hypertension: Secondary | ICD-10-CM | POA: Diagnosis not present

## 2016-10-21 DIAGNOSIS — L97929 Non-pressure chronic ulcer of unspecified part of left lower leg with unspecified severity: Secondary | ICD-10-CM | POA: Diagnosis not present

## 2016-10-21 DIAGNOSIS — N186 End stage renal disease: Secondary | ICD-10-CM

## 2016-10-21 DIAGNOSIS — Z794 Long term (current) use of insulin: Secondary | ICD-10-CM

## 2016-10-21 NOTE — Assessment & Plan Note (Signed)
The patient appears to have a good response to have successful ablation in his swelling is down and his wound is healed. No intervention is currently planned at this point and he does not have a lot of residual varicosities that would warrant sclerotherapy at this time. He can continue to use his compression stocking as needed for swelling. He should return to clinic in 3-4 months in follow-up

## 2016-10-21 NOTE — Progress Notes (Signed)
MRN : [id number]  [name] [name] is a 41 y.o. ([birth year]) male who presents with chief complaint of  Chief Complaint  Patient presents with  . Re-evaluation    3-4 week follow up  .  History of Present Illness: Patient returns today in follow up of venous insufficiency. He is about a month status post left great saphenous vein laser ablation. His wound has remained healed. Swelling is better. He still complains of soreness at the access site and overlying the location of the saphenous vein. His access site is well-healed with a small scars present. Duplex showed successful ablations. He is wearing his compression device today.  Current Outpatient Prescriptions  Medication Sig Dispense Refill  . amLODipine (NORVASC) 10 MG tablet Take 1 tablet by mouth daily.    Marland Kitchen aspirin EC 81 MG tablet Take 81 mg by mouth.    . calcium acetate (PHOSLO) 667 MG capsule     . carvedilol (COREG) 25 MG tablet Take 1 tablet by mouth 2 (two) times daily.    . clindamycin (CLEOCIN T) 1 % lotion     . FOSRENOL 1000 MG chewable tablet Chew 2 tablets by mouth 4 (four) times daily. With 3 meals and 1 snack.  6  . furosemide (LASIX) 40 MG tablet Take 1 tablet by mouth 2 (two) times daily.    Marland Kitchen HUMULIN 70/30 (70-30) 100 UNIT/ML injection Inject 10-30 Units into the skin 2 (two) times daily with a meal. 10 units before breakfast and 30 units before super    . hydrALAZINE (APRESOLINE) 50 MG tablet Take 1 tablet by mouth 3 (three) times daily.    Marland Kitchen losartan (COZAAR) 100 MG tablet Take 1 tablet by mouth daily.    . SENSIPAR 60 MG tablet Take 1 tablet by mouth daily.  3  . warfarin (COUMADIN) 10 MG tablet Take 10 mg by mouth daily.    Marland Kitchen warfarin (COUMADIN) 4 MG tablet Take 1 tablet by mouth daily. Reported on 06/28/2015     No current facility-administered medications for this visit.     Past Medical History:  Diagnosis Date  . CHF (congestive heart failure) ([place])   . Diabetes mellitus without complication ([place])     . Hypertension   . Renal disorder   . Shortness of breath dyspnea     Past Surgical History:  Procedure Laterality Date  . PERIPHERAL VASCULAR CATHETERIZATION N/A 05/07/2015   Procedure: A/V Shuntogram/Fistulagram;  Surgeon: Katha Cabal, MD;  Location: [place];  Service: Cardiovascular;  Laterality: N/A;  . PERIPHERAL VASCULAR CATHETERIZATION Left 05/07/2015   Procedure: A/V Shunt Intervention;  Surgeon: Katha Cabal, MD;  Location: [place] [facility];  Service: Cardiovascular;  Laterality: Left;  . TEE WITHOUT CARDIOVERSION N/A 07/05/2015   Procedure: TRANSESOPHAGEAL ECHOCARDIOGRAM (TEE);  Surgeon: Dionisio David, MD;  Location: [facility];  Service: Cardiovascular;  Laterality: N/A;    Social History Social History  Substance Use Topics  . Smoking status: Never Smoker  . Smokeless tobacco: Never Used  . Alcohol use No    Family History No bleeding or clotting disorders  Allergies  Allergen Reactions  . Lisinopril Other (See Comments)     REVIEW OF SYSTEMS (Negative unless checked)  Constitutional: [] Weight loss  [] Fever  [] Chills Cardiac: [] Chest pain   [] Chest pressure   [] Palpitations   [] Shortness of breath when laying flat   [] Shortness of breath at rest   [] Shortness of breath with exertion. Vascular:  [] Pain  in legs with walking   [] Pain in legs at rest   [] Pain in legs when laying flat   [] Claudication   [] Pain in feet when walking  [] Pain in feet at rest  [] Pain in feet when laying flat   [] History of DVT   [] Phlebitis   [x] Swelling in legs   [x] Varicose veins   [] Non-healing ulcers Pulmonary:   [] Uses home oxygen   [] Productive cough   [] Hemoptysis   [] Wheeze  [] COPD   [] Asthma Neurologic:  [] Dizziness  [] Blackouts   [] Seizures   [] History of stroke   [] History of TIA  [] Aphasia   [] Temporary blindness   [] Dysphagia   [] Weakness or numbness in arms   [] Weakness or numbness in legs Musculoskeletal:  [] Arthritis   [] Joint swelling   [] Joint pain    [] Low back pain Hematologic:  [] Easy bruising  [] Easy bleeding   [] Hypercoagulable state   [] Anemic   Gastrointestinal:  [] Blood in stool   [] Vomiting blood  [] Gastroesophageal reflux/heartburn   [] Abdominal pain Genitourinary:  [x] Chronic kidney disease   [] Difficult urination  [] Frequent urination  [] Burning with urination   [] Hematuria Skin:  [] Rashes   [x] Ulcers   [x] Wounds Psychological:  [] History of anxiety   []  History of major depression.  Physical Examination  BP 125/76 (BP Location: Right Arm)   Pulse 83   Resp 15   Ht 6\' 1"  (1.854 m)   Wt 287 lb (130.2 kg)   BMI 37.87 kg/m  Gen:  WD/WN, NAD Head: [place]/AT, No temporalis wasting. Ear/Nose/Throat: Hearing grossly intact, nares w/o erythema or drainage, trachea midline Eyes: Conjunctiva clear. Sclera non-icteric Neck: Supple.  No JVD.  Pulmonary:  Good air movement, no use of accessory muscles.  Cardiac: RRR, normal S1, S2 Vascular:  Vessel Right Left  Radial Palpable Palpable                          PT Palpable Palpable  DP Palpable Palpable    Musculoskeletal: M/S 5/5 throughout.  No deformity or atrophy. Trace BLE edema. Neurologic: Sensation grossly intact in extremities.  Symmetrical.  Speech is fluent.  Psychiatric: Judgment intact, Mood & affect appropriate for pt's clinical situation. Dermatologic: previous wound left medial calf has healed      Labs No results found for this or any previous visit (from the past 2160 hour(s)).  Radiology No results found.    Assessment/Plan ESRD on dialysis [facility]) Renal failure likely worsens his volume status and swelling.  Gets HD and access working well.    Diabetes ([place]) blood glucose control important in reducing the progression of atherosclerotic disease. Also, involved in wound healing. On appropriate medications.   Essential hypertension blood pressure control important in reducing the progression of atherosclerotic disease. On appropriate oral  medications.   Venous ulcer of left leg ([place]) The patient appears to have a good response to have successful ablation in his swelling is down and his wound is healed. No intervention is currently planned at this point and he does not have a lot of residual varicosities that would warrant sclerotherapy at this time. He can continue to use his compression stocking as needed for swelling. He should return to clinic in 3-4 months in follow-up    [name], MD  10/21/2016 11:35 AM    This note was created with Dragon medical transcription system.  Any errors from dictation are purely unintentional

## 2016-11-02 IMAGING — CR DG CHEST 2V
2 series · 2 of 2 positions shown · non-contrast
Comparison: Portable chest x-ray February 22, 2014

CLINICAL DATA: Obstructive dialysis port

EXAM:
CHEST  2 VIEW

[chest pa]
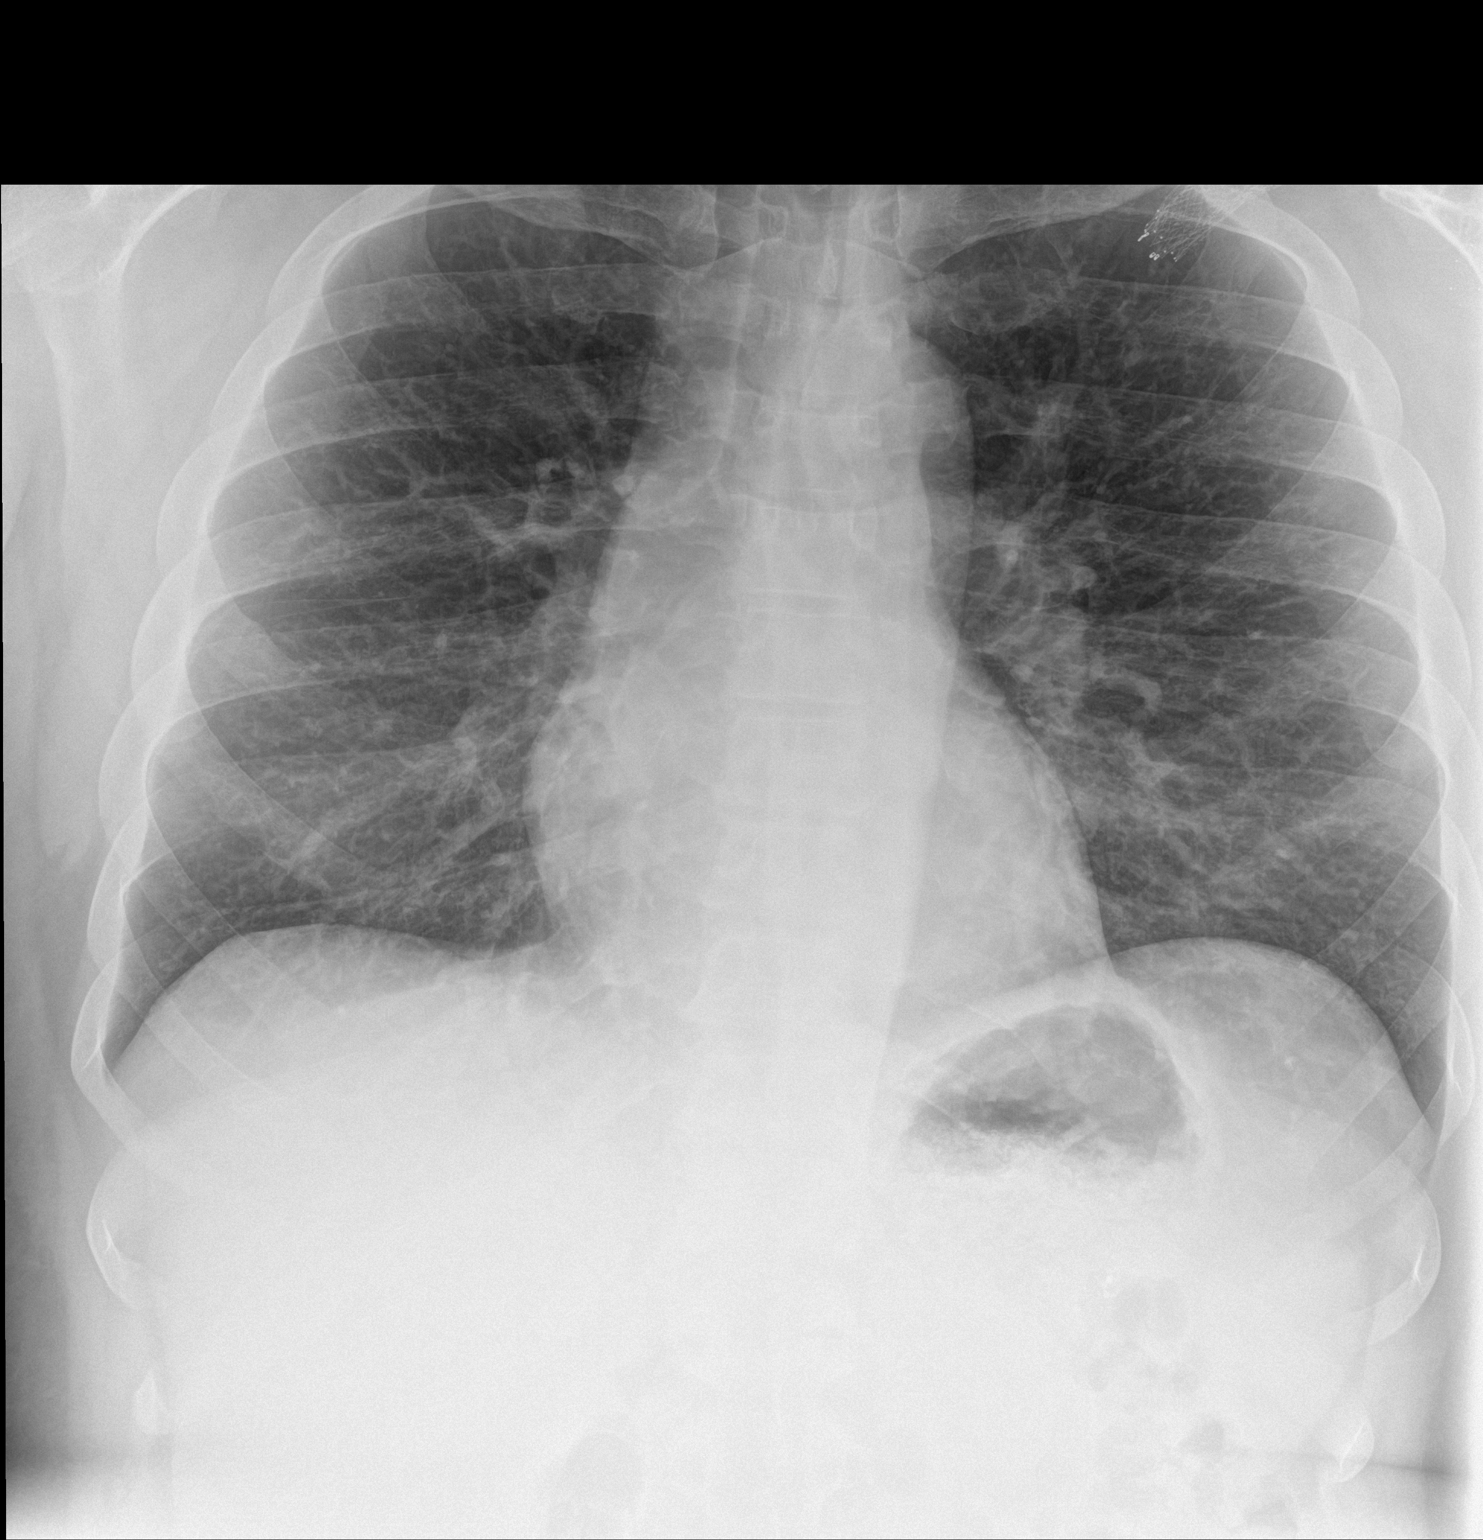

[chest lat]
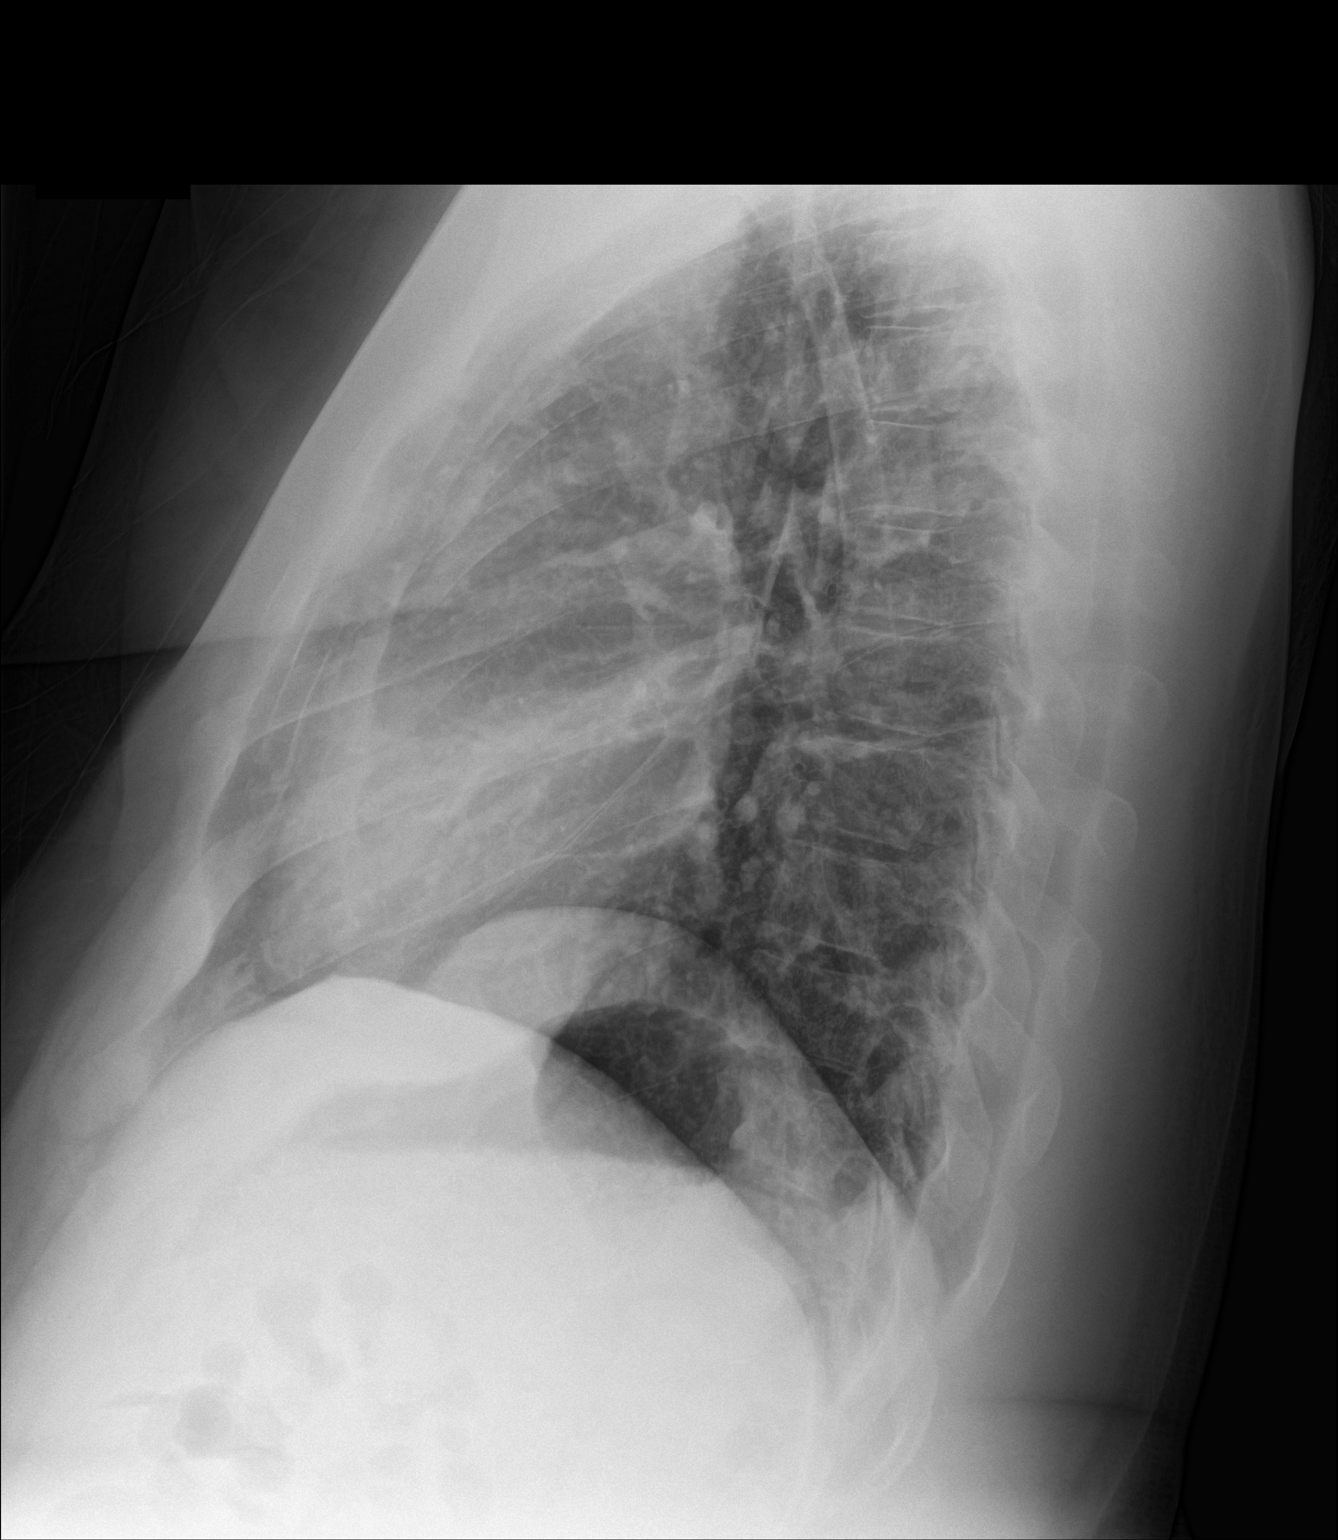

[2 of 2 positions shown; findings below may reference images not displayed]

FINDINGS: The lungs are adequately inflated. There is no pulmonary edema or
alveolar infiltrate. There is no pleural effusion or pneumothorax.
The heart and pulmonary vascularity are normal. A distal left
subclavian vascular graft is present. The bony thorax is
unremarkable.
IMPRESSION: There is no active cardiopulmonary disease.

## 2016-12-16 ENCOUNTER — Other Ambulatory Visit: Payer: Self-pay | Admitting: Nurse Practitioner

## 2016-12-16 ENCOUNTER — Ambulatory Visit
Admission: RE | Admit: 2016-12-16 | Discharge: 2016-12-16 | Disposition: A | Discharge status: Home / Self Care | Payer: Medicare Other | Source: Ambulatory Visit | Attending: Nurse Practitioner | Admitting: Nurse Practitioner

## 2016-12-16 DIAGNOSIS — R109 Unspecified abdominal pain: Secondary | ICD-10-CM | POA: Diagnosis not present

## 2017-01-02 ENCOUNTER — Other Ambulatory Visit: Payer: Self-pay | Admitting: Nurse Practitioner

## 2017-01-02 DIAGNOSIS — R1310 Dysphagia, unspecified: Secondary | ICD-10-CM

## 2017-01-08 ENCOUNTER — Ambulatory Visit: Payer: Medicare Other | Attending: Nurse Practitioner

## 2017-01-22 ENCOUNTER — Ambulatory Visit
Admission: RE | Admit: 2017-01-22 | Discharge: 2017-01-22 | Disposition: A | Discharge status: Home / Self Care | Payer: Medicare Other | Source: Ambulatory Visit | Attending: Nurse Practitioner | Admitting: Nurse Practitioner

## 2017-01-22 DIAGNOSIS — R1310 Dysphagia, unspecified: Secondary | ICD-10-CM

## 2017-01-22 DIAGNOSIS — R1311 Dysphagia, oral phase: Secondary | ICD-10-CM | POA: Insufficient documentation

## 2017-02-17 ENCOUNTER — Encounter (INDEPENDENT_AMBULATORY_CARE_PROVIDER_SITE_OTHER): Payer: Self-pay | Admitting: Vascular Surgery

## 2017-02-17 ENCOUNTER — Ambulatory Visit (INDEPENDENT_AMBULATORY_CARE_PROVIDER_SITE_OTHER): Payer: Medicare Other | Admitting: Vascular Surgery

## 2017-02-17 VITALS — BP 159/85 | HR 88 | Resp 16 | Ht 71.0 in | Wt 294.0 lb

## 2017-02-17 DIAGNOSIS — N186 End stage renal disease: Secondary | ICD-10-CM

## 2017-02-17 DIAGNOSIS — E1122 Type 2 diabetes mellitus with diabetic chronic kidney disease: Secondary | ICD-10-CM | POA: Diagnosis not present

## 2017-02-17 DIAGNOSIS — Z992 Dependence on renal dialysis: Secondary | ICD-10-CM | POA: Diagnosis not present

## 2017-02-17 DIAGNOSIS — L97929 Non-pressure chronic ulcer of unspecified part of left lower leg with unspecified severity: Secondary | ICD-10-CM

## 2017-02-17 DIAGNOSIS — Z794 Long term (current) use of insulin: Secondary | ICD-10-CM

## 2017-02-17 DIAGNOSIS — I83029 Varicose veins of left lower extremity with ulcer of unspecified site: Secondary | ICD-10-CM

## 2017-02-17 DIAGNOSIS — I872 Venous insufficiency (chronic) (peripheral): Secondary | ICD-10-CM

## 2017-02-17 NOTE — Progress Notes (Signed)
Subjective:    Patient ID: [name] [name], male    DOB: [birth year], 41 y.o.   MRN: [id number] Chief Complaint  Patient presents with  . Follow-up    3-19mos   Patient presents for a 3 month follow-up. He was last seen in June 2018 status post a left greater saphenous vein ablation due to chronic left ankle venous ulcerations. Patient presents today without complaint. He has not experienced a new ulcer since his last visit. Patient denies any swelling to the left lower extremity. He denies any left lower externally claudication, rest pain or ulceration.Patient continues to wear his medical grade 1 compression stockings, elevate his legs and remaining active. Patient denies any fever, nausea or vomiting.   Review of Systems  Constitutional: Negative.   HENT: Negative.   Eyes: Negative.   Respiratory: Negative.   Cardiovascular: Negative.   Gastrointestinal: Negative.   Endocrine: Negative.   Genitourinary: Negative.   Musculoskeletal: Negative.   Skin: Negative.   Allergic/Immunologic: Negative.   Neurological: Negative.   Hematological: Negative.   Psychiatric/Behavioral: Negative.       Objective:   Physical Exam  Constitutional: He is oriented to person, place, and time. He appears well-developed and well-nourished. No distress.  HENT:  Head: Normocephalic and atraumatic.  Eyes: Pupils are equal, round, and reactive to light.  Neck: Normal range of motion.  Cardiovascular: Normal rate, regular rhythm, normal heart sounds and intact distal pulses.   Pulses:      Radial pulses are 2+ on the right side, and 2+ on the left side.       Dorsalis pedis pulses are 2+ on the right side, and 2+ on the left side.       Posterior tibial pulses are 2+ on the right side, and 2+ on the left side.  Pulmonary/Chest: Effort normal.  Musculoskeletal: Normal range of motion. He exhibits no edema.  Neurological: He is alert and oriented to person, place, and time.  Skin: He is not  diaphoretic.  Left lower extremity: medial ankle - skin is intact.There is no edema.   Psychiatric: He has a normal mood and affect. His behavior is normal. Judgment and thought content normal.  Vitals reviewed.  BP (!) 159/85 (BP Location: Right Wrist)   Pulse 88   Resp 16   Ht 5\' 11"  (1.803 m)   Wt 294 lb (133.4 kg)   BMI 41.00 kg/m   Past Medical History:  Diagnosis Date  . CHF (congestive heart failure) ([place])   . Diabetes mellitus without complication ([place])   . Hypertension   . Renal disorder   . Shortness of breath dyspnea    Social History   Social History  . Marital status: Single    Spouse name: N/A  . Number of children: N/A  . Years of education: N/A   Occupational History  . Not on file.   Social History Main Topics  . Smoking status: Never Smoker  . Smokeless tobacco: Never Used  . Alcohol use No  . Drug use: No  . Sexual activity: Not on file   Other Topics Concern  . Not on file   Social History Narrative  . No narrative on file   Past Surgical History:  Procedure Laterality Date  . PERIPHERAL VASCULAR CATHETERIZATION N/A 05/07/2015   Procedure: A/V Shuntogram/Fistulagram;  Surgeon: Katha Cabal, MD;  Location: [facility];  Service: Cardiovascular;  Laterality: N/A;  . PERIPHERAL VASCULAR CATHETERIZATION Left 05/07/2015   Procedure:  A/V Shunt Intervention;  Surgeon: Katha Cabal, MD;  Location: [facility];  Service: Cardiovascular;  Laterality: Left;  . TEE WITHOUT CARDIOVERSION N/A 07/05/2015   Procedure: TRANSESOPHAGEAL ECHOCARDIOGRAM (TEE);  Surgeon: Dionisio David, MD;  Location: [facility];  Service: Cardiovascular;  Laterality: N/A;    No family history on file.  Allergies  Allergen Reactions  . Lisinopril Other (See Comments)      Assessment & Plan:  Patient presents for a 3 month follow-up. He was last seen in June 2018 status post a left greater saphenous vein ablation due to chronic left ankle venous  ulcerations. Patient presents today without complaint. He has not experienced a new ulcer since his last visit. Patient denies any swelling to the left lower extremity. He denies any left lower externally claudication, rest pain or ulceration.Patient continues to wear his medical grade 1 compression stockings, elevate his legs and remaining active. Patient denies any fever, nausea or vomiting.  1. Chronic venous insufficiency - Stable Patient is status post a left greater saphenous ablation on 09/16/2016. This was done for chronic left medial ankle ulcerations. The patient has not experienced any new ulcer development since that time. Patient is without complaint denies any left lower extremity pain or swelling. The patient was encouraged to continue wearing his medical grade 1 compression stockings elevating his legs and remaining active. Patient to call the office if he should start to experience any symptoms.  2. Venous ulcer of left leg (HCC) - Stable As above  3. Type 2 diabetes mellitus with chronic kidney disease on chronic dialysis, with long-term current use of insulin (HCC) - Stable Encouraged good control as its slows the progression of atherosclerotic disease  Current Outpatient Prescriptions on File Prior to Visit  Medication Sig Dispense Refill  . amLODipine (NORVASC) 10 MG tablet Take 1 tablet by mouth daily.    Marland Kitchen aspirin EC 81 MG tablet Take 81 mg by mouth.    . calcium acetate (PHOSLO) 667 MG capsule     . carvedilol (COREG) 25 MG tablet Take 1 tablet by mouth 2 (two) times daily.    . clindamycin (CLEOCIN T) 1 % lotion     . HUMULIN 70/30 (70-30) 100 UNIT/ML injection Inject 10-30 Units into the skin 2 (two) times daily with a meal. 10 units before breakfast and 30 units before super    . losartan (COZAAR) 100 MG tablet Take 1 tablet by mouth daily.    . SENSIPAR 60 MG tablet Take 1 tablet by mouth daily.  3  . FOSRENOL 1000 MG chewable tablet Chew 2 tablets by mouth 4  (four) times daily. With 3 meals and 1 snack.  6  . furosemide (LASIX) 40 MG tablet Take 1 tablet by mouth 2 (two) times daily.    . hydrALAZINE (APRESOLINE) 50 MG tablet Take 1 tablet by mouth 3 (three) times daily.    Marland Kitchen warfarin (COUMADIN) 10 MG tablet Take 10 mg by mouth daily.    Marland Kitchen warfarin (COUMADIN) 4 MG tablet Take 1 tablet by mouth daily. Reported on 06/28/2015     No current facility-administered medications on file prior to visit.     There are no Patient Instructions on file for this visit. No Follow-up on file.   Shaunice Levitan A Gyan Cambre, PA-C

## 2017-02-19 ENCOUNTER — Other Ambulatory Visit
Admission: RE | Admit: 2017-02-19 | Discharge: 2017-02-19 | Disposition: A | Discharge status: Home / Self Care | Payer: Medicare Other | Source: Ambulatory Visit | Attending: Gastroenterology | Admitting: Gastroenterology

## 2017-02-19 DIAGNOSIS — R197 Diarrhea, unspecified: Secondary | ICD-10-CM | POA: Diagnosis present

## 2017-02-19 LAB — GASTROINTESTINAL PANEL BY PCR, STOOL (REPLACES STOOL CULTURE)
ADENOVIRUS F40/41: NOT DETECTED
ASTROVIRUS: NOT DETECTED
CAMPYLOBACTER SPECIES: NOT DETECTED
CRYPTOSPORIDIUM: NOT DETECTED
CYCLOSPORA CAYETANENSIS: NOT DETECTED
ENTEROAGGREGATIVE E COLI (EAEC): NOT DETECTED
ENTEROPATHOGENIC E COLI (EPEC): NOT DETECTED
ENTEROTOXIGENIC E COLI (ETEC): NOT DETECTED
Entamoeba histolytica: NOT DETECTED
GIARDIA LAMBLIA: NOT DETECTED
Norovirus GI/GII: NOT DETECTED
PLESIMONAS SHIGELLOIDES: NOT DETECTED
Rotavirus A: NOT DETECTED
Salmonella species: NOT DETECTED
Sapovirus (I, II, IV, and V): NOT DETECTED
Shiga like toxin producing E coli (STEC): NOT DETECTED
Shigella/Enteroinvasive E coli (EIEC): NOT DETECTED
VIBRIO SPECIES: NOT DETECTED
Vibrio cholerae: NOT DETECTED
Yersinia enterocolitica: NOT DETECTED

## 2017-02-19 LAB — C DIFFICILE QUICK SCREEN W PCR REFLEX
C DIFFICILE (CDIFF) INTERP: NOT DETECTED
C DIFFICILE (CDIFF) TOXIN: NEGATIVE
C Diff antigen: NEGATIVE

## 2017-04-17 ENCOUNTER — Encounter: Payer: Self-pay | Admitting: *Deleted

## 2017-04-23 ENCOUNTER — Encounter: Payer: Self-pay | Admitting: *Deleted

## 2017-04-23 ENCOUNTER — Encounter
Admission: RE | Disposition: A | Discharge status: Home / Self Care | Payer: Self-pay | Source: Ambulatory Visit | Attending: Gastroenterology

## 2017-04-23 ENCOUNTER — Ambulatory Visit
Admission: RE | Admit: 2017-04-23 | Discharge: 2017-04-23 | Disposition: A | Discharge status: Home / Self Care | Payer: Medicare Other | Source: Ambulatory Visit | Attending: Gastroenterology | Admitting: Gastroenterology

## 2017-04-23 ENCOUNTER — Ambulatory Visit: Payer: Medicare Other | Admitting: Anesthesiology

## 2017-04-23 DIAGNOSIS — Z7982 Long term (current) use of aspirin: Secondary | ICD-10-CM | POA: Diagnosis not present

## 2017-04-23 DIAGNOSIS — Z79899 Other long term (current) drug therapy: Secondary | ICD-10-CM | POA: Insufficient documentation

## 2017-04-23 DIAGNOSIS — Z6841 Body Mass Index (BMI) 40.0 and over, adult: Secondary | ICD-10-CM | POA: Diagnosis not present

## 2017-04-23 DIAGNOSIS — Z888 Allergy status to other drugs, medicaments and biological substances status: Secondary | ICD-10-CM | POA: Insufficient documentation

## 2017-04-23 DIAGNOSIS — I509 Heart failure, unspecified: Secondary | ICD-10-CM | POA: Insufficient documentation

## 2017-04-23 DIAGNOSIS — I132 Hypertensive heart and chronic kidney disease with heart failure and with stage 5 chronic kidney disease, or end stage renal disease: Secondary | ICD-10-CM | POA: Insufficient documentation

## 2017-04-23 DIAGNOSIS — Z992 Dependence on renal dialysis: Secondary | ICD-10-CM | POA: Diagnosis not present

## 2017-04-23 DIAGNOSIS — G4730 Sleep apnea, unspecified: Secondary | ICD-10-CM | POA: Diagnosis not present

## 2017-04-23 DIAGNOSIS — R1084 Generalized abdominal pain: Secondary | ICD-10-CM | POA: Insufficient documentation

## 2017-04-23 DIAGNOSIS — K529 Noninfective gastroenteritis and colitis, unspecified: Secondary | ICD-10-CM | POA: Insufficient documentation

## 2017-04-23 DIAGNOSIS — E1022 Type 1 diabetes mellitus with diabetic chronic kidney disease: Secondary | ICD-10-CM | POA: Diagnosis not present

## 2017-04-23 DIAGNOSIS — Z7901 Long term (current) use of anticoagulants: Secondary | ICD-10-CM | POA: Insufficient documentation

## 2017-04-23 DIAGNOSIS — K210 Gastro-esophageal reflux disease with esophagitis: Secondary | ICD-10-CM | POA: Diagnosis not present

## 2017-04-23 DIAGNOSIS — E1051 Type 1 diabetes mellitus with diabetic peripheral angiopathy without gangrene: Secondary | ICD-10-CM | POA: Diagnosis not present

## 2017-04-23 DIAGNOSIS — Z91041 Radiographic dye allergy status: Secondary | ICD-10-CM | POA: Diagnosis not present

## 2017-04-23 DIAGNOSIS — N186 End stage renal disease: Secondary | ICD-10-CM | POA: Diagnosis not present

## 2017-04-23 DIAGNOSIS — Z794 Long term (current) use of insulin: Secondary | ICD-10-CM | POA: Insufficient documentation

## 2017-04-23 DIAGNOSIS — K2950 Unspecified chronic gastritis without bleeding: Secondary | ICD-10-CM | POA: Insufficient documentation

## 2017-04-23 DIAGNOSIS — Z538 Procedure and treatment not carried out for other reasons: Secondary | ICD-10-CM | POA: Diagnosis not present

## 2017-04-23 HISTORY — DX: Gastro-esophageal reflux disease without esophagitis: K21.9

## 2017-04-23 HISTORY — PX: COLONOSCOPY WITH PROPOFOL: SHX5780

## 2017-04-23 HISTORY — PX: ESOPHAGOGASTRODUODENOSCOPY (EGD) WITH PROPOFOL: SHX5813

## 2017-04-23 LAB — GLUCOSE, CAPILLARY: GLUCOSE-CAPILLARY: 207 mg/dL — AB (ref 65–99)

## 2017-04-23 SURGERY — ESOPHAGOGASTRODUODENOSCOPY (EGD) WITH PROPOFOL
Anesthesia: General

## 2017-04-23 MED ORDER — MIDAZOLAM HCL 2 MG/2ML IJ SOLN
INTRAMUSCULAR | Status: AC
  Filled 2017-04-23: qty 2

## 2017-04-23 MED ORDER — SODIUM CHLORIDE 0.9 % IV SOLN: INTRAVENOUS | Status: DC

## 2017-04-23 MED ORDER — FENTANYL CITRATE (PF) 100 MCG/2ML IJ SOLN
INTRAMUSCULAR | Status: AC
  Filled 2017-04-23: qty 2

## 2017-04-23 MED ORDER — PROPOFOL 500 MG/50ML IV EMUL
INTRAVENOUS | Status: DC | PRN
  Administered 2017-04-23: 120 ug/kg/min via INTRAVENOUS

## 2017-04-23 MED ORDER — MIDAZOLAM HCL 2 MG/2ML IJ SOLN
INTRAMUSCULAR | Status: DC | PRN
  Administered 2017-04-23: 2 mg via INTRAVENOUS

## 2017-04-23 MED ORDER — PROPOFOL 10 MG/ML IV BOLUS
INTRAVENOUS | Status: DC | PRN
  Administered 2017-04-23: 40 mg via INTRAVENOUS

## 2017-04-23 MED ORDER — PROPOFOL 10 MG/ML IV BOLUS
INTRAVENOUS | Status: AC
  Filled 2017-04-23: qty 20

## 2017-04-23 MED ORDER — SODIUM CHLORIDE 0.9 % IV SOLN
INTRAVENOUS | Status: DC
  Administered 2017-04-23: 09:00:00 via INTRAVENOUS

## 2017-04-23 MED ORDER — FENTANYL CITRATE (PF) 100 MCG/2ML IJ SOLN
INTRAMUSCULAR | Status: DC | PRN
  Administered 2017-04-23 (×2): 50 ug via INTRAVENOUS

## 2017-04-23 NOTE — Anesthesia Preprocedure Evaluation (Signed)
Anesthesia Evaluation  Patient identified by MRN, date of birth, ID band Patient awake    Reviewed: Allergy & Precautions, NPO status , Patient's Chart, lab work & pertinent test results  Airway Mallampati: III       Dental  (+) Teeth Intact, Missing   Pulmonary shortness of breath, sleep apnea ,     + decreased breath sounds      Cardiovascular Exercise Tolerance: Good hypertension, Pt. on medications + Peripheral Vascular Disease and +CHF   Rhythm:Regular     Neuro/Psych negative neurological ROS     GI/Hepatic Neg liver ROS, GERD  Medicated,  Endo/Other  diabetes, Type 1, Insulin DependentMorbid obesity  Renal/GU DialysisRenal disease     Musculoskeletal   Abdominal (+) + obese,   Peds negative pediatric ROS (+)  Hematology   Anesthesia Other Findings   Reproductive/Obstetrics                             Anesthesia Physical Anesthesia Plan  ASA: IV  Anesthesia Plan: General   Post-op Pain Management:    Induction: Intravenous  PONV Risk Score and Plan:   Airway Management Planned: Natural Airway and Nasal Cannula  Additional Equipment:   Intra-op Plan:   Post-operative Plan:   Informed Consent: I have reviewed the patients History and Physical, chart, labs and discussed the procedure including the risks, benefits and alternatives for the proposed anesthesia with the patient or authorized representative who has indicated his/her understanding and acceptance.     Plan Discussed with: Surgeon  Anesthesia Plan Comments:         Anesthesia Quick Evaluation

## 2017-04-23 NOTE — Anesthesia Post-op Follow-up Note (Signed)
Anesthesia QCDR form completed.        

## 2017-04-23 NOTE — Anesthesia Postprocedure Evaluation (Signed)
Anesthesia Post Note  Patient: [name] [name]  Procedure(s) Performed: ESOPHAGOGASTRODUODENOSCOPY (EGD) WITH PROPOFOL (N/A ) COLONOSCOPY WITH PROPOFOL (N/A )  Patient location during evaluation: PACU Anesthesia Type: General Level of consciousness: awake Pain management: pain level controlled Vital Signs Assessment: post-procedure vital signs reviewed and stable Respiratory status: spontaneous breathing Cardiovascular status: stable Anesthetic complications: no     Last Vitals:  Vitals:   04/23/17 1100 04/23/17 1110  BP: (!) 158/77 (!) 165/77  Pulse:  76  Resp:  12  Temp:    SpO2:  100%    Last Pain:  Vitals:   04/23/17 1050  TempSrc: Tympanic                 VAN STAVEREN,Devaeh Amadi

## 2017-04-23 NOTE — H&P (Signed)
Outpatient short stay form Pre-procedure 04/23/2017 10:10 AM [name] Sails MD  Primary Physician: Dr Lamonte Sakai  Reason for visit:  EGD and colonoscopy  History of present illness:  Patient is a 41 year old male presenting today as above. He has a history of multiple medical problems including end-stage renal disease "on dialysis" hypertension diabetes insulin dependence. He has multiple complications from the diabetes. He has been having loose stools which seem to get better with the changes medications. He also has some generalized abdominal pain but mostly centered in the right upper quadrant. He has been started on present pantoprazole 40 mg a day. He tolerated his prep however I'm uncertain as to whether he is clear by his report. I was unable to do a rectal examination due to patient reticence. He takes no aspirin or blood thinning agents with the exception of 81 mg aspirin.    Current Facility-Administered Medications:  .  0.9 %  sodium chloride infusion, , Intravenous, Continuous, [name] Sails, MD, Last Rate: 20 mL/hr at 04/23/17 0906 .  0.9 %  sodium chloride infusion, , Intravenous, Continuous, [name] Sails, MD  Medications Prior to Admission  Medication Sig Dispense Refill Last Dose  . amLODipine (NORVASC) 10 MG tablet Take 1 tablet by mouth daily.   04/22/2017 at Unknown time  . aspirin EC 81 MG tablet Take 81 mg by mouth.   04/22/2017 at Unknown time  . calcium acetate (PHOSLO) 667 MG capsule    04/22/2017 at Unknown time  . carvedilol (COREG) 25 MG tablet Take 1 tablet by mouth 2 (two) times daily.   04/22/2017 at Unknown time  . Cholecalciferol (VITAMIN D3) 2000 units capsule Take 2,000 Units by mouth daily.   04/22/2017 at Unknown time  . cinacalcet (SENSIPAR) 60 MG tablet Take 60 mg by mouth daily. With food   04/22/2017 at Unknown time  . clindamycin (CLEOCIN T) 1 % lotion    04/22/2017 at Unknown time  . HUMULIN 70/30 (70-30) 100 UNIT/ML injection Inject  10-30 Units into the skin 2 (two) times daily with a meal. 10 units before breakfast and 30 units before super   04/22/2017 at Unknown time  . lidocaine-prilocaine (EMLA) cream Apply 1 application topically once.     Marland Kitchen losartan (COZAAR) 100 MG tablet Take 1 tablet by mouth daily.   04/22/2017 at Unknown time  . SENSIPAR 60 MG tablet Take 1 tablet by mouth daily.  3 04/22/2017 at Unknown time  . sevelamer carbonate (RENVELA) 800 MG tablet Take 800 mg by mouth 3 (three) times daily with meals.     Marland Kitchen FOSRENOL 1000 MG chewable tablet Chew 2 tablets by mouth 4 (four) times daily. With 3 meals and 1 snack.  6 Not Taking  . furosemide (LASIX) 40 MG tablet Take 1 tablet by mouth 2 (two) times daily.   Not Taking at Unknown time  . hydrALAZINE (APRESOLINE) 50 MG tablet Take 1 tablet by mouth 3 (three) times daily.   Not Taking at Unknown time  . warfarin (COUMADIN) 10 MG tablet Take 10 mg by mouth daily.   Not Taking at Unknown time  . warfarin (COUMADIN) 4 MG tablet Take 1 tablet by mouth daily. Reported on 06/28/2015   Not Taking at Unknown time     Allergies  Allergen Reactions  . Ivp Dye [Iodinated Diagnostic Agents]   . Lisinopril Other (See Comments)     Past Medical History:  Diagnosis Date  . CHF (congestive heart failure) ([place])   .  Diabetes mellitus without complication ([place])   . GERD (gastroesophageal reflux disease)   . Hypertension   . Renal disorder    on dialysis for 4 years three times a week last was Tuesday  . Shortness of breath dyspnea     Review of systems:      Physical Exam    Heart and lungs: Regular rate and rhythm without rub or gallop, lungs are bilaterally clear.    HEENT: Normocephalic atraumatic eyes are anicteric    Other:     Pertinant exam for procedure: Soft nontender protuberant/obese, no masses or rebound. Bowel sounds are positive normoactive.    Planned proceedures: EGD, colonoscopy and indicated procedures. I have discussed the risks benefits  and complications of procedures to include not limited to bleeding, infection, perforation and the risk of sedation and the patient wishes to proceed.    [name] Sails, MD Gastroenterology 04/23/2017  10:10 AM

## 2017-04-23 NOTE — Op Note (Signed)
[facility] Gastroenterology Patient Name: [name] [name] Procedure Date: 04/23/2017 9:59 AM MRN: [id number] Account #: [id number] Date of Birth: 02-18-1976 Admit Type: Outpatient Age: 41 Room: [facility] ENDO ROOM 1 Gender: Male Note Status: Finalized Procedure:            Upper GI endoscopy Indications:          Abdominal pain in the right upper quadrant, Generalized                        abdominal pain Providers:            [name], MD Referring MD:         Perrin Maltese, MD (Referring MD) Medicines:            Monitored Anesthesia Care Complications:        No immediate complications. Procedure:            Pre-Anesthesia Assessment:                       - ASA Grade Assessment: III - A patient with severe                        systemic disease.                       After obtaining informed consent, the endoscope was                        passed under direct vision. Throughout the procedure,                        the patient's blood pressure, pulse, and oxygen                        saturations were monitored continuously. The Endoscope                        was introduced through the mouth, and advanced to the                        third part of duodenum. The patient tolerated the                        procedure well. Findings:      LA Grade A (one or more mucosal breaks less than 5 mm, not extending       between tops of 2 mucosal folds) esophagitis with no bleeding was found.       Biopsies were taken with a cold forceps for histology.      The exam of the esophagus was otherwise normal.      Localized moderate inflammation characterized by congestion (edema),       erythema and granularity was found on the greater curvature of the       stomach and on the posterior wall of the stomach. Biopsies were taken       with a cold forceps for histology.      The cardia and gastric fundus were normal on retroflexion otherwise.      The examined  duodenum was normal. Impression:           - LA Grade A esophagitis. Biopsied.                       -  Gastritis. Biopsied.                       - Normal examined duodenum. Recommendation:       - Use Protonix (pantoprazole) 40 mg PO daily.                       - Await pathology results.                       - Return to GI clinic in 4 weeks. Procedure Code(s):    --- Professional ---                       [phone], Esophagogastroduodenoscopy, flexible, transoral;                        with biopsy, single or multiple Diagnosis Code(s):    --- Professional ---                       K20.9, Esophagitis, unspecified                       K29.70, Gastritis, unspecified, without bleeding                       R10.11, Right upper quadrant pain                       R10.84, Generalized abdominal pain CPT copyright 2016 American Medical Association. All rights reserved. The codes documented in this report are preliminary and upon coder review may  be revised to meet current compliance requirements. [name], MD 04/23/2017 10:42:39 AM This report has been signed electronically. Number of Addenda: 0 Note Initiated On: 04/23/2017 9:59 AM      [facility]

## 2017-04-23 NOTE — Transfer of Care (Signed)
Immediate Anesthesia Transfer of Care Note  Patient: [name] [name]  Procedure(s) Performed: ESOPHAGOGASTRODUODENOSCOPY (EGD) WITH PROPOFOL (N/A ) COLONOSCOPY WITH PROPOFOL (N/A )  Patient Location: PACU  Anesthesia Type:General  Level of Consciousness: awake  Airway & Oxygen Therapy: Patient Spontanous Breathing and Patient connected to nasal cannula oxygen  Post-op Assessment: Report given to RN and Post -op Vital signs reviewed and stable  Post vital signs: Reviewed  Last Vitals:  Vitals:   04/23/17 0822  BP: (!) 142/82  Pulse: 81  Resp: 20  Temp: (!) 35.9 C  SpO2: 100%    Last Pain:  Vitals:   04/23/17 0822  TempSrc: Tympanic         Complications: No apparent anesthesia complications

## 2017-04-23 NOTE — Op Note (Signed)
[facility] Gastroenterology Patient Name: [name] [name] Procedure Date: 04/23/2017 9:59 AM MRN: [id number] Account #: [id number] Date of Birth: [birth year] Admit Type: Outpatient Age: 41 Room: [facility] ENDO ROOM 1 Gender: Male Note Status: Finalized Procedure:            Colonoscopy Indications:          Generalized abdominal pain, Chronic diarrhea Providers:            [name], MD Medicines:            Monitored Anesthesia Care Complications:        No immediate complications. Procedure:            Pre-Anesthesia Assessment:                       - ASA Grade Assessment: III - A patient with severe                        systemic disease.                       After obtaining informed consent, the colonoscope was                        passed under direct vision. Throughout the procedure,                        the patient's blood pressure, pulse, and oxygen                        saturations were monitored continuously. The procedure                        was aborted. The colonscope was not inserted.                        Medications were given. The quality of the bowel                        preparation was poor. Findings:      after his EGD, patient bed was rotated, semi-formed stool was over the       bed. Rectal exam was done with finding of stool in the rectal vault.       Procedure aborted.      poor prep Impression:           - Preparation of the colon was poor.                       - No specimens collected. Recommendation:       - reschedule and reprep. Diagnosis Code(s):    --- Professional ---                       R10.84, Generalized abdominal pain                       K52.9, Noninfective gastroenteritis and colitis,                        unspecified [name], MD 04/23/2017 10:52:12 AM This report has been signed electronically. Number of Addenda: 0 Note Initiated On: 04/23/2017 9:59 AM  [facility]

## 2017-04-24 ENCOUNTER — Encounter: Payer: Self-pay | Admitting: Gastroenterology

## 2017-04-24 LAB — SURGICAL PATHOLOGY

## 2017-07-01 ENCOUNTER — Encounter: Payer: Self-pay | Admitting: *Deleted

## 2017-07-02 ENCOUNTER — Ambulatory Visit
Admission: RE | Admit: 2017-07-02 | Discharge: 2017-07-02 | Disposition: A | Discharge status: Home / Self Care | Payer: Medicare Other | Source: Ambulatory Visit | Attending: Gastroenterology | Admitting: Gastroenterology

## 2017-07-02 ENCOUNTER — Ambulatory Visit: Payer: Medicare Other | Admitting: Anesthesiology

## 2017-07-02 ENCOUNTER — Encounter: Payer: Self-pay | Admitting: Anesthesiology

## 2017-07-02 ENCOUNTER — Encounter
Admission: RE | Disposition: A | Discharge status: Home / Self Care | Payer: Self-pay | Source: Ambulatory Visit | Attending: Gastroenterology

## 2017-07-02 DIAGNOSIS — I132 Hypertensive heart and chronic kidney disease with heart failure and with stage 5 chronic kidney disease, or end stage renal disease: Secondary | ICD-10-CM | POA: Diagnosis not present

## 2017-07-02 DIAGNOSIS — Z794 Long term (current) use of insulin: Secondary | ICD-10-CM | POA: Diagnosis not present

## 2017-07-02 DIAGNOSIS — E1122 Type 2 diabetes mellitus with diabetic chronic kidney disease: Secondary | ICD-10-CM | POA: Insufficient documentation

## 2017-07-02 DIAGNOSIS — R197 Diarrhea, unspecified: Secondary | ICD-10-CM | POA: Diagnosis not present

## 2017-07-02 DIAGNOSIS — Z7982 Long term (current) use of aspirin: Secondary | ICD-10-CM | POA: Insufficient documentation

## 2017-07-02 DIAGNOSIS — R109 Unspecified abdominal pain: Secondary | ICD-10-CM | POA: Insufficient documentation

## 2017-07-02 DIAGNOSIS — Z7901 Long term (current) use of anticoagulants: Secondary | ICD-10-CM | POA: Insufficient documentation

## 2017-07-02 DIAGNOSIS — Z79899 Other long term (current) drug therapy: Secondary | ICD-10-CM | POA: Diagnosis not present

## 2017-07-02 DIAGNOSIS — K529 Noninfective gastroenteritis and colitis, unspecified: Secondary | ICD-10-CM | POA: Insufficient documentation

## 2017-07-02 DIAGNOSIS — Z992 Dependence on renal dialysis: Secondary | ICD-10-CM | POA: Diagnosis not present

## 2017-07-02 DIAGNOSIS — K219 Gastro-esophageal reflux disease without esophagitis: Secondary | ICD-10-CM | POA: Insufficient documentation

## 2017-07-02 DIAGNOSIS — N186 End stage renal disease: Secondary | ICD-10-CM | POA: Insufficient documentation

## 2017-07-02 DIAGNOSIS — I509 Heart failure, unspecified: Secondary | ICD-10-CM | POA: Diagnosis not present

## 2017-07-02 HISTORY — PX: COLONOSCOPY WITH PROPOFOL: SHX5780

## 2017-07-02 LAB — GLUCOSE, CAPILLARY: GLUCOSE-CAPILLARY: 128 mg/dL — AB (ref 65–99)

## 2017-07-02 SURGERY — COLONOSCOPY WITH PROPOFOL
Anesthesia: General

## 2017-07-02 MED ORDER — SODIUM CHLORIDE 0.9 % IV SOLN
INTRAVENOUS | Status: DC
  Administered 2017-07-02: 1000 mL via INTRAVENOUS

## 2017-07-02 MED ORDER — MIDAZOLAM HCL 2 MG/2ML IJ SOLN
INTRAMUSCULAR | Status: AC
  Filled 2017-07-02: qty 2

## 2017-07-02 MED ORDER — SODIUM CHLORIDE 0.9 % IV SOLN: INTRAVENOUS | Status: DC

## 2017-07-02 MED ORDER — MIDAZOLAM HCL 2 MG/2ML IJ SOLN
INTRAMUSCULAR | Status: DC | PRN
  Administered 2017-07-02: 2 mg via INTRAVENOUS

## 2017-07-02 MED ORDER — FENTANYL CITRATE (PF) 100 MCG/2ML IJ SOLN
INTRAMUSCULAR | Status: AC
  Filled 2017-07-02: qty 2

## 2017-07-02 MED ORDER — EPHEDRINE SULFATE 50 MG/ML IJ SOLN
INTRAMUSCULAR | Status: DC | PRN
  Administered 2017-07-02: 5 mg via INTRAVENOUS

## 2017-07-02 MED ORDER — FENTANYL CITRATE (PF) 100 MCG/2ML IJ SOLN
INTRAMUSCULAR | Status: DC | PRN
  Administered 2017-07-02: 50 ug via INTRAVENOUS

## 2017-07-02 MED ORDER — EPHEDRINE SULFATE 50 MG/ML IJ SOLN
INTRAMUSCULAR | Status: AC
  Filled 2017-07-02: qty 1

## 2017-07-02 MED ORDER — PROPOFOL 500 MG/50ML IV EMUL
INTRAVENOUS | Status: AC
  Filled 2017-07-02: qty 50

## 2017-07-02 MED ORDER — PROPOFOL 10 MG/ML IV BOLUS
INTRAVENOUS | Status: AC
  Filled 2017-07-02: qty 20

## 2017-07-02 MED ORDER — PROPOFOL 500 MG/50ML IV EMUL
INTRAVENOUS | Status: DC | PRN
  Administered 2017-07-02: 180 ug/kg/min via INTRAVENOUS

## 2017-07-02 NOTE — Op Note (Addendum)
[facility] Gastroenterology Patient Name: [name] [name] Procedure Date: 07/02/2017 7:31 AM MRN: [id number] Account #: [id number] Date of Birth: 07/24/1975 Admit Type: Outpatient Age: 42 Room: [facility] ENDO ROOM 1 Gender: Male Note Status: Finalized Procedure:            Colonoscopy Indications:          Abdominal pain, Chronic diarrhea Providers:            [name], MD Referring MD:         Maryellen Pile (Referring MD) Medicines:            Monitored Anesthesia Care Complications:        No immediate complications. Procedure:            Pre-Anesthesia Assessment:                       - ASA Grade Assessment: III - A patient with severe                        systemic disease.                       After obtaining informed consent, the colonoscope was                        passed under direct vision. Throughout the procedure,                        the patient's blood pressure, pulse, and oxygen                        saturations were monitored continuously. The                        Colonoscope was introduced through the anus and                        advanced to the the cecum, identified by appendiceal                        orifice and ileocecal valve. The colonoscopy was                        performed with moderate difficulty due to poor bowel                        prep. The patient tolerated the procedure well. The                        quality of the bowel preparation was fair to poor. Findings:      The colon showed extensive spasm throughout.      The exam was otherwise without abnormality.      Biopsies for histology were taken with a cold forceps for evaluation of       microscopic colitis. Good hemostasis throughout.      The retroflexed view of the distal rectum and anal verge was normal and       showed no anal or rectal abnormalities.      The digital rectal exam was normal. Impression:           - Preparation of  the colon was  fair.                       - The examination was otherwise normal.                       - The distal rectum and anal verge are normal on                        retroflexion view.                       - Biopsies were taken with a cold forceps for                        evaluation of microscopic colitis. Recommendation:       - Discharge patient to home. Patient complaint of                        abdominal pain is sharp and of very short duration.                        Short acting antispasmodics will likely not be of                        benefit. Will trial Lebbid time released 0.375 mg bid. Procedure Code(s):    --- Professional ---                       [phone], Colonoscopy, flexible; with biopsy, single or                        multiple Diagnosis Code(s):    --- Professional ---                       R10.9, Unspecified abdominal pain                       K52.9, Noninfective gastroenteritis and colitis,                        unspecified CPT copyright 2016 American Medical Association. All rights reserved. The codes documented in this report are preliminary and upon coder review may  be revised to meet current compliance requirements. [name], MD 07/02/2017 9:11:17 AM This report has been signed electronically. Number of Addenda: 0 Note Initiated On: 07/02/2017 7:31 AM Scope Withdrawal Time: 0 hours 13 minutes 54 seconds  Total Procedure Duration: 0 hours 29 minutes 1 second       [facility]

## 2017-07-02 NOTE — H&P (Signed)
Outpatient short stay form Pre-procedure 07/02/2017 7:47 AM [name] MD  Primary Physician: Trinda Pascal MD  Reason for visit: Colonoscopy  History of present illness: Patient is a 42 year old male presenting today as above.  He has a personal history of end-stage renal disease and is on hemodialysis Monday Wednesday and Friday.  He has a history of diabetes mellitus.  There is also a history of hypertension.  He has been having some epigastric and bilateral flank pain.  This seems to be triggered by dialysis but can also occur under other issues.  He does get some occasional heartburn he has no swallowing difficulty.  He does have chronic diarrhea although states that it seems to be a little better over the past year or 2.  Sees no blood in the stool.  Tolerated his prep well.  He takes 81 mg aspirin but not since yesterday.  He denies other aspirin products or blood thinning agents.  View of the EMR today indicated that he has been continuing to take Coumadin 10mg .  Denies taking this.  Did obtain notes from his cardiologist that states that since he had a TEE in March 2017 and there was no clot at that time that he was to stop the Coumadin.  At this point I assume that the EMR must be incorrect.    Current Facility-Administered Medications:  .  0.9 %  sodium chloride infusion, , Intravenous, Continuous, [name], MD, Last Rate: 20 mL/hr at 07/02/17 0744, 1,000 mL at 07/02/17 0744 .  0.9 %  sodium chloride infusion, , Intravenous, Continuous, [name], MD  Medications Prior to Admission  Medication Sig Dispense Refill Last Dose  . amLODipine (NORVASC) 10 MG tablet Take 1 tablet by mouth daily.   Past Week at Unknown time  . aspirin EC 81 MG tablet Take 81 mg by mouth.   Past Week at Unknown time  . calcium acetate (PHOSLO) 667 MG capsule    Past Week at Unknown time  . carvedilol (COREG) 25 MG tablet Take 1 tablet by mouth 2 (two) times daily.   Past Week at Unknown  time  . Cholecalciferol (VITAMIN D3) 2000 units capsule Take 2,000 Units by mouth daily.   Past Week at Unknown time  . cinacalcet (SENSIPAR) 60 MG tablet Take 60 mg by mouth daily. With food   Past Week at Unknown time  . clindamycin (CLEOCIN T) 1 % lotion    Past Week at Unknown time  . FOSRENOL 1000 MG chewable tablet Chew 2 tablets by mouth 4 (four) times daily. With 3 meals and 1 snack.  6 Past Week at Unknown time  . furosemide (LASIX) 40 MG tablet Take 1 tablet by mouth 2 (two) times daily.   Past Week at Unknown time  . HUMULIN 70/30 (70-30) 100 UNIT/ML injection Inject 10-30 Units into the skin 2 (two) times daily with a meal. 10 units before breakfast and 30 units before super   Past Week at Unknown time  . hydrALAZINE (APRESOLINE) 50 MG tablet Take 1 tablet by mouth 3 (three) times daily.   Past Week at Unknown time  . lidocaine-prilocaine (EMLA) cream Apply 1 application topically once.   Past Week at Unknown time  . losartan (COZAAR) 100 MG tablet Take 1 tablet by mouth daily.   Past Week at Unknown time  . SENSIPAR 60 MG tablet Take 1 tablet by mouth daily.  3 Past Week at Unknown time  . sevelamer carbonate (RENVELA)  800 MG tablet Take 800 mg by mouth 3 (three) times daily with meals.   Past Week at Unknown time  . warfarin (COUMADIN) 10 MG tablet Take 10 mg by mouth daily.   Past Week at Unknown time  . warfarin (COUMADIN) 4 MG tablet Take 1 tablet by mouth daily. Reported on 06/28/2015   Past Week at Unknown time     Allergies  Allergen Reactions  . Ivp Dye [Iodinated Diagnostic Agents]   . Lisinopril Other (See Comments)     Past Medical History:  Diagnosis Date  . CHF (congestive heart failure) ([place])   . Diabetes mellitus without complication ([place])   . GERD (gastroesophageal reflux disease)   . Hypertension   . Renal disorder    on dialysis for 4 years three times a week last was Tuesday  . Shortness of breath dyspnea     Review of systems:      Physical  Exam    Heart and lungs: Regular rate and rhythm without rub or gallop, lungs are bilaterally clear    HEENT: Normocephalic atraumatic eyes are anicteric    Other:    Pertinant exam for procedure: Soft nontender nondistended, obese, bowel sounds are positive normoactive.    Planned proceedures: Colonoscopy and indicated procedures. I have discussed the risks benefits and complications of procedures to include not limited to bleeding, infection, perforation and the risk of sedation and the patient wishes to proceed.    [name], MD Gastroenterology 07/02/2017  7:47 AM

## 2017-07-02 NOTE — Transfer of Care (Signed)
Immediate Anesthesia Transfer of Care Note  Patient: Leroi Haque Nurse  Procedure(s) Performed: COLONOSCOPY WITH PROPOFOL (N/A )  Patient Location: PACU  Anesthesia Type:General  Level of Consciousness: awake and sedated  Airway & Oxygen Therapy: Patient Spontanous Breathing and Patient connected to face mask oxygen  Post-op Assessment: Report given to RN and Post -op Vital signs reviewed and stable  Post vital signs: Reviewed and stable  Last Vitals:  Vitals:   07/02/17 0704  BP: (!) 141/85  Pulse: 87  Resp: 16  Temp: (!) 36 C  SpO2: 100%    Last Pain:  Vitals:   07/02/17 0704  TempSrc: Tympanic         Complications: No apparent anesthesia complications

## 2017-07-02 NOTE — Anesthesia Post-op Follow-up Note (Signed)
Anesthesia QCDR form completed.        

## 2017-07-02 NOTE — Anesthesia Procedure Notes (Signed)
Performed by: Cook-Martin, Rivkah Wolz °Pre-anesthesia Checklist: Patient identified, Emergency Drugs available, Suction available, Patient being monitored and Timeout performed °Patient Re-evaluated:Patient Re-evaluated prior to induction °Oxygen Delivery Method: Simple face mask °Preoxygenation: Pre-oxygenation with 100% oxygen °Induction Type: IV induction °Placement Confirmation: positive ETCO2 and CO2 detector ° ° ° ° ° ° °

## 2017-07-02 NOTE — Anesthesia Postprocedure Evaluation (Signed)
Anesthesia Post Note  Patient: [name] [name]  Procedure(s) Performed: COLONOSCOPY WITH PROPOFOL (N/A )  Patient location during evaluation: Endoscopy Anesthesia Type: General Level of consciousness: awake and alert and oriented Pain management: pain level controlled Vital Signs Assessment: post-procedure vital signs reviewed and stable Respiratory status: spontaneous breathing, nonlabored ventilation and respiratory function stable Cardiovascular status: blood pressure returned to baseline and stable Postop Assessment: no signs of nausea or vomiting Anesthetic complications: no     Last Vitals:  Vitals:   07/02/17 0910 07/02/17 0920  BP: (!) 143/73 138/83  Pulse: 81 81  Resp: (!) 29 15  Temp:    SpO2: 99% 100%    Last Pain:  Vitals:   07/02/17 0704  TempSrc: Tympanic                 [name] [name]

## 2017-07-02 NOTE — Anesthesia Preprocedure Evaluation (Signed)
Anesthesia Evaluation  Patient identified by MRN, date of birth, ID band Patient awake    Reviewed: Allergy & Precautions, NPO status , Patient's Chart, lab work & pertinent test results  History of Anesthesia Complications Negative for: history of anesthetic complications  Airway Mallampati: III  TM Distance: >3 FB Neck ROM: Full    Dental  (+) Poor Dentition, Missing   Pulmonary neg sleep apnea, neg COPD,    breath sounds clear to auscultation- rhonchi (-) wheezing      Cardiovascular hypertension, Pt. on medications +CHF (diastolic)  (-) CAD, (-) Past MI, (-) Cardiac Stents and (-) CABG  Rhythm:Regular Rate:Normal - Systolic murmurs and - Diastolic murmurs Echo 11/26/13: - Left ventricle: The cavity size was normal. Wall thickness was   normal. Systolic function was normal. The estimated ejection   fraction was 60%. Wall motion was normal; there were no regional   wall motion abnormalities. - Aortic valve: There was trivial regurgitation. - Mitral valve: There was mild regurgitation. - Left atrium: The atrium was dilated. No evidence of thrombus in   the atrial cavity or appendage. - Right atrium: No evidence of thrombus in the appendage. No   evidence of thrombus in the atrial cavity or appendage. - Atrial septum: No defect or patent foramen ovale was identified.   Neuro/Psych negative neurological ROS  negative psych ROS   GI/Hepatic Neg liver ROS, GERD  ,  Endo/Other  diabetes, Insulin Dependent  Renal/GU ESRF and DialysisRenal disease (last dialysis 07/01/17)     Musculoskeletal negative musculoskeletal ROS (+)   Abdominal (+) + obese,   Peds  Hematology negative hematology ROS (+)   Anesthesia Other Findings Past Medical History: No date: CHF (congestive heart failure) (HCC) No date: Diabetes mellitus without complication (H[facility]) No date: GERD (gastroesophageal reflux disease) No date: Hypertension No  date: Renal disorder     Comment:  on dialysis for 4 years three times a week last was               Tuesday No date: Shortness of breath dyspnea   Reproductive/Obstetrics                             Anesthesia Physical Anesthesia Plan  ASA: IV  Anesthesia Plan: General   Post-op Pain Management:    Induction: Intravenous  PONV Risk Score and Plan: 1 and Propofol infusion  Airway Management Planned: Natural Airway  Additional Equipment:   Intra-op Plan:   Post-operative Plan:   Informed Consent: I have reviewed the patients History and Physical, chart, labs and discussed the procedure including the risks, benefits and alternatives for the proposed anesthesia with the patient or authorized representative who has indicated his/her understanding and acceptance.   Dental advisory given  Plan Discussed with: CRNA and Anesthesiologist  Anesthesia Plan Comments:         Anesthesia Quick Evaluation

## 2017-07-03 ENCOUNTER — Encounter: Payer: Self-pay | Admitting: Gastroenterology

## 2017-07-03 LAB — SURGICAL PATHOLOGY

## 2017-07-30 ENCOUNTER — Emergency Department: Payer: Medicare Other

## 2017-07-30 ENCOUNTER — Emergency Department
Admission: EM | Admit: 2017-07-30 | Discharge: 2017-07-30 | Disposition: A | Discharge status: Home / Self Care | Payer: Medicare Other | Attending: Emergency Medicine | Admitting: Emergency Medicine

## 2017-07-30 ENCOUNTER — Other Ambulatory Visit: Payer: Self-pay

## 2017-07-30 DIAGNOSIS — N186 End stage renal disease: Secondary | ICD-10-CM | POA: Insufficient documentation

## 2017-07-30 DIAGNOSIS — S161XXA Strain of muscle, fascia and tendon at neck level, initial encounter: Secondary | ICD-10-CM

## 2017-07-30 DIAGNOSIS — Z7901 Long term (current) use of anticoagulants: Secondary | ICD-10-CM | POA: Insufficient documentation

## 2017-07-30 DIAGNOSIS — M7918 Myalgia, other site: Secondary | ICD-10-CM | POA: Diagnosis not present

## 2017-07-30 DIAGNOSIS — Y999 Unspecified external cause status: Secondary | ICD-10-CM | POA: Diagnosis not present

## 2017-07-30 DIAGNOSIS — I509 Heart failure, unspecified: Secondary | ICD-10-CM | POA: Diagnosis not present

## 2017-07-30 DIAGNOSIS — Z992 Dependence on renal dialysis: Secondary | ICD-10-CM | POA: Insufficient documentation

## 2017-07-30 DIAGNOSIS — I132 Hypertensive heart and chronic kidney disease with heart failure and with stage 5 chronic kidney disease, or end stage renal disease: Secondary | ICD-10-CM | POA: Diagnosis not present

## 2017-07-30 DIAGNOSIS — E1122 Type 2 diabetes mellitus with diabetic chronic kidney disease: Secondary | ICD-10-CM | POA: Insufficient documentation

## 2017-07-30 DIAGNOSIS — Z794 Long term (current) use of insulin: Secondary | ICD-10-CM | POA: Insufficient documentation

## 2017-07-30 DIAGNOSIS — Z7982 Long term (current) use of aspirin: Secondary | ICD-10-CM | POA: Insufficient documentation

## 2017-07-30 DIAGNOSIS — Z79899 Other long term (current) drug therapy: Secondary | ICD-10-CM | POA: Insufficient documentation

## 2017-07-30 DIAGNOSIS — Y939 Activity, unspecified: Secondary | ICD-10-CM | POA: Insufficient documentation

## 2017-07-30 DIAGNOSIS — M25512 Pain in left shoulder: Secondary | ICD-10-CM | POA: Diagnosis not present

## 2017-07-30 DIAGNOSIS — S199XXA Unspecified injury of neck, initial encounter: Secondary | ICD-10-CM | POA: Diagnosis present

## 2017-07-30 DIAGNOSIS — Y92410 Unspecified street and highway as the place of occurrence of the external cause: Secondary | ICD-10-CM | POA: Insufficient documentation

## 2017-07-30 MED ORDER — CYCLOBENZAPRINE HCL 10 MG PO TABS
10.0000 mg | ORAL_TABLET | Freq: Once | ORAL | Status: AC
  Administered 2017-07-30: 10 mg via ORAL
  Filled 2017-07-30: qty 1

## 2017-07-30 MED ORDER — CYCLOBENZAPRINE HCL 10 MG PO TABS: 10.0000 mg | ORAL_TABLET | Freq: Three times a day (TID) | ORAL | 0 refills | Status: DC | PRN
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
## 2017-07-30 NOTE — ED Notes (Signed)

## 2017-09-28 ENCOUNTER — Other Ambulatory Visit (INDEPENDENT_AMBULATORY_CARE_PROVIDER_SITE_OTHER): Payer: Self-pay | Admitting: Vascular Surgery

## 2017-09-28 ENCOUNTER — Encounter (INDEPENDENT_AMBULATORY_CARE_PROVIDER_SITE_OTHER): Payer: Self-pay

## 2017-09-30 MED ORDER — CEFAZOLIN SODIUM-DEXTROSE 1-4 GM/50ML-% IV SOLN
1.0000 g | Freq: Once | INTRAVENOUS | Status: AC
  Administered 2017-10-01: 1 g via INTRAVENOUS

## 2017-10-01 ENCOUNTER — Ambulatory Visit
Admission: RE | Admit: 2017-10-01 | Discharge: 2017-10-01 | Disposition: A | Discharge status: Home / Self Care | Payer: Medicare Other | Source: Ambulatory Visit | Attending: Vascular Surgery | Admitting: Vascular Surgery

## 2017-10-01 ENCOUNTER — Encounter
Admission: RE | Disposition: A | Discharge status: Home / Self Care | Payer: Self-pay | Source: Ambulatory Visit | Attending: Vascular Surgery

## 2017-10-01 DIAGNOSIS — E1122 Type 2 diabetes mellitus with diabetic chronic kidney disease: Secondary | ICD-10-CM | POA: Diagnosis not present

## 2017-10-01 DIAGNOSIS — Z79899 Other long term (current) drug therapy: Secondary | ICD-10-CM | POA: Insufficient documentation

## 2017-10-01 DIAGNOSIS — Z888 Allergy status to other drugs, medicaments and biological substances status: Secondary | ICD-10-CM | POA: Diagnosis not present

## 2017-10-01 DIAGNOSIS — N186 End stage renal disease: Secondary | ICD-10-CM | POA: Insufficient documentation

## 2017-10-01 DIAGNOSIS — Z7982 Long term (current) use of aspirin: Secondary | ICD-10-CM | POA: Insufficient documentation

## 2017-10-01 DIAGNOSIS — K219 Gastro-esophageal reflux disease without esophagitis: Secondary | ICD-10-CM | POA: Diagnosis not present

## 2017-10-01 DIAGNOSIS — Z992 Dependence on renal dialysis: Secondary | ICD-10-CM | POA: Insufficient documentation

## 2017-10-01 DIAGNOSIS — I509 Heart failure, unspecified: Secondary | ICD-10-CM | POA: Diagnosis not present

## 2017-10-01 DIAGNOSIS — Z9889 Other specified postprocedural states: Secondary | ICD-10-CM | POA: Insufficient documentation

## 2017-10-01 DIAGNOSIS — Y832 Surgical operation with anastomosis, bypass or graft as the cause of abnormal reaction of the patient, or of later complication, without mention of misadventure at the time of the procedure: Secondary | ICD-10-CM | POA: Insufficient documentation

## 2017-10-01 DIAGNOSIS — I132 Hypertensive heart and chronic kidney disease with heart failure and with stage 5 chronic kidney disease, or end stage renal disease: Secondary | ICD-10-CM | POA: Insufficient documentation

## 2017-10-01 DIAGNOSIS — T82868A Thrombosis of vascular prosthetic devices, implants and grafts, initial encounter: Secondary | ICD-10-CM | POA: Diagnosis not present

## 2017-10-01 DIAGNOSIS — Z91041 Radiographic dye allergy status: Secondary | ICD-10-CM | POA: Diagnosis not present

## 2017-10-01 DIAGNOSIS — T82858A Stenosis of vascular prosthetic devices, implants and grafts, initial encounter: Secondary | ICD-10-CM | POA: Diagnosis present

## 2017-10-01 HISTORY — PX: A/V FISTULAGRAM: CATH118298

## 2017-10-01 LAB — GLUCOSE, CAPILLARY
GLUCOSE-CAPILLARY: 152 mg/dL — AB (ref 65–99)
GLUCOSE-CAPILLARY: 181 mg/dL — AB (ref 65–99)

## 2017-10-01 LAB — POTASSIUM (ARMC VASCULAR LAB ONLY): Potassium (ARMC vascular lab): 4 (ref 3.5–5.1)

## 2017-10-01 SURGERY — A/V FISTULAGRAM
Anesthesia: Moderate Sedation | Laterality: Left

## 2017-10-01 MED ORDER — FENTANYL CITRATE (PF) 100 MCG/2ML IJ SOLN
INTRAMUSCULAR | Status: AC
  Filled 2017-10-01: qty 2

## 2017-10-01 MED ORDER — MIDAZOLAM HCL 2 MG/2ML IJ SOLN
INTRAMUSCULAR | Status: DC | PRN
  Administered 2017-10-01 (×3): 1 mg via INTRAVENOUS
  Administered 2017-10-01: 2 mg via INTRAVENOUS

## 2017-10-01 MED ORDER — DIPHENHYDRAMINE HCL 50 MG/ML IJ SOLN
INTRAMUSCULAR | Status: AC
  Filled 2017-10-01: qty 1

## 2017-10-01 MED ORDER — LIDOCAINE-EPINEPHRINE (PF) 1 %-1:200000 IJ SOLN
INTRAMUSCULAR | Status: AC
  Filled 2017-10-01: qty 30

## 2017-10-01 MED ORDER — HEPARIN SODIUM (PORCINE) 1000 UNIT/ML IJ SOLN
INTRAMUSCULAR | Status: DC | PRN
  Administered 2017-10-01: 3000 [IU] via INTRAVENOUS

## 2017-10-01 MED ORDER — IOPAMIDOL (ISOVUE-300) INJECTION 61%
INTRAVENOUS | Status: DC | PRN
  Administered 2017-10-01: 20 mL via INTRA_ARTERIAL

## 2017-10-01 MED ORDER — SODIUM CHLORIDE 0.9 % IV SOLN
INTRAVENOUS | Status: DC
  Administered 2017-10-01: 08:00:00 via INTRAVENOUS

## 2017-10-01 MED ORDER — FENTANYL CITRATE (PF) 100 MCG/2ML IJ SOLN
INTRAMUSCULAR | Status: DC | PRN
  Administered 2017-10-01 (×3): 50 ug via INTRAVENOUS

## 2017-10-01 MED ORDER — MIDAZOLAM HCL 5 MG/5ML IJ SOLN
INTRAMUSCULAR | Status: AC
  Filled 2017-10-01: qty 5

## 2017-10-01 MED ORDER — HYDROMORPHONE HCL 1 MG/ML IJ SOLN: 1.0000 mg | Freq: Once | INTRAMUSCULAR | Status: DC | PRN

## 2017-10-01 MED ORDER — HEPARIN SODIUM (PORCINE) 1000 UNIT/ML IJ SOLN
INTRAMUSCULAR | Status: AC
  Filled 2017-10-01: qty 1

## 2017-10-01 MED ORDER — FAMOTIDINE 20 MG PO TABS
40.0000 mg | ORAL_TABLET | ORAL | Status: DC | PRN
  Administered 2017-10-01: 40 mg via ORAL

## 2017-10-01 MED ORDER — METHYLPREDNISOLONE SODIUM SUCC 125 MG IJ SOLR
125.0000 mg | INTRAMUSCULAR | Status: DC | PRN
  Administered 2017-10-01: 125 mg via INTRAVENOUS

## 2017-10-01 MED ORDER — HEPARIN (PORCINE) IN NACL 1000-0.9 UT/500ML-% IV SOLN
INTRAVENOUS | Status: AC
  Filled 2017-10-01: qty 1000

## 2017-10-01 MED ORDER — DIPHENHYDRAMINE HCL 50 MG/ML IJ SOLN
INTRAMUSCULAR | Status: DC | PRN
  Administered 2017-10-01: 50 mg via INTRAVENOUS

## 2017-10-01 MED ORDER — METHYLPREDNISOLONE SODIUM SUCC 125 MG IJ SOLR
INTRAMUSCULAR | Status: AC
  Filled 2017-10-01: qty 2

## 2017-10-01 MED ORDER — ONDANSETRON HCL 4 MG/2ML IJ SOLN: 4.0000 mg | Freq: Four times a day (QID) | INTRAMUSCULAR | Status: DC | PRN

## 2017-10-01 MED ORDER — FAMOTIDINE 20 MG PO TABS
ORAL_TABLET | ORAL | Status: AC
  Filled 2017-10-01: qty 2

## 2017-10-01 SURGICAL SUPPLY — 16 items
BALLN DORADO 10X80X80 (BALLOONS) ×3
BALLN LUTONIX AV 9X60X75 (BALLOONS) ×3
BALLOON DORADO 10X80X80 (BALLOONS) ×1 IMPLANT
BALLOON LUTONIX AV 9X60X75 (BALLOONS) ×1 IMPLANT
CANNULA 5F STIFF (CANNULA) ×3 IMPLANT
CATH BEACON 5 .035 40 KMP TP (CATHETERS) ×1 IMPLANT
CATH BEACON 5 .038 40 KMP TP (CATHETERS) ×2
DEVICE PRESTO INFLATION (MISCELLANEOUS) ×3 IMPLANT
DRAPE BRACHIAL (DRAPES) ×3 IMPLANT
PACK ANGIOGRAPHY (CUSTOM PROCEDURE TRAY) ×3 IMPLANT
SHEATH BRITE TIP 6FRX5.5 (SHEATH) ×3 IMPLANT
SHEATH BRITE TIP 8FRX11 (SHEATH) ×3 IMPLANT
STENT COVERA STRAIGHT 10X60X80 (Permanent Stent) ×3 IMPLANT
SUT MNCRL AB 4-0 PS2 18 (SUTURE) ×3 IMPLANT
TOWEL OR 17X26 4PK STRL BLUE (TOWEL DISPOSABLE) ×3 IMPLANT
WIRE MAGIC TOR.035 180C (WIRE) ×3 IMPLANT

## 2017-10-01 NOTE — Op Note (Signed)
[facility]    OPERATIVE NOTE   PROCEDURE: 1.   Left brachiocephalic arteriovenous fistula cannulation under ultrasound guidance 2.   Left arm fistulagram including central venogram 3.   Percutaneous transluminal angioplasty of left cephalic vein subclavian vein confluence with 9 mm diameter Lutonix drug-coated angioplasty balloon 4.   Covera stent placement to the left cephalic vein subclavian vein confluence with 10 mm diameter by 6 cm length stent for greater than 50% residual stenosis after angioplasty  PRE-OPERATIVE DIAGNOSIS: 1. ESRD 2. Poorly functional aneurysmal left brachiocephalic AVF  POST-OPERATIVE DIAGNOSIS: same as above   SURGEON: [name], MD  ANESTHESIA: local with MCS  ESTIMATED BLOOD LOSS: 3 cc  FINDING(S): 1. Very tortuous and somewhat aneurysmal left brachiocephalic AV fistula in the upper arm.  He had a series of stents in the cephalic vein in the proximal arm and then at the cephalic vein subclavian vein confluence.  The stent in the upper arm was patent without significant stenosis but there was an in-stent stenosis in the cephalic vein subclavian vein confluence stent of about 70 to 80%.  The remainder of the central venous circulation was widely patent.  SPECIMEN(S):  None  CONTRAST: 20 cc  FLUORO TIME: 2.1 minutes  MODERATE CONSCIOUS SEDATION TIME: Approximately 25 minutes with 5 mg of Versed and 150 mcg of Fentanyl   INDICATIONS: [name] [name] is a 42 y.o. male who presents with malfunctioning aneurysmal left brachiocephalic arteriovenous fistula.  The patient is scheduled for left arm fistulagram.  The patient is aware the risks include but are not limited to: bleeding, infection, thrombosis of the cannulated access, and possible anaphylactic reaction to the contrast.  The patient is aware of the risks of the procedure and elects to proceed forward.  DESCRIPTION: After full informed written consent was obtained, the  patient was brought back to the angiography suite and placed supine upon the angiography table.  The patient was connected to monitoring equipment. Moderate conscious sedation was administered with a face to face encounter with the patient throughout the procedure with my supervision of the RN administering medicines and monitoring the patient's vital signs and mental status throughout from the start of the procedure until the patient was taken to the recovery room. The left arm was prepped and draped in the standard fashion for a percutaneous access intervention.  Under ultrasound guidance, the left brachiocephalic arteriovenous fistula was cannulated with a micropuncture needle under direct ultrasound guidance and a permanent image was performed.  The microwire was advanced into the fistula and the needle was exchanged for the a microsheath.  I then upsized to a 6 Fr Sheath and imaging was performed.  Hand injections were completed to image the access including the central venous system. This demonstrated a very tortuous and somewhat aneurysmal left brachiocephalic AV fistula in the upper arm.  He had a series of stents in the cephalic vein in the proximal arm and then at the cephalic vein subclavian vein confluence.  The stent in the upper arm was patent without significant stenosis but there was an in-stent stenosis in the cephalic vein subclavian vein confluence stent of about 70 to 80%.  The remainder of the central venous circulation was widely patent..  Based on the images, this patient will need intervention to this in stent stenosis. I then gave the patient 3000 units of intravenous heparin.  I then crossed the stenosis with a Magic Tourqe wire.  Based on the imaging, a 9 mm  x 6 cm Lutonix drug-coated angioplasty balloon was selected.  The balloon was centered around the cephalic vein subclavian vein confluence in-stent stenosis and inflated to 12 ATM for 1 minute(s).  On completion imaging, a greater  than 50 % residual stenosis was present.  I then upsized to an 8 [place] sheath and used a 10 mm diameter by 6 cm length Covera covered stent.  This was deployed across the cephalic vein subclavian vein confluence just into the subclavian vein and encompassing the lesion within the previously placed stents.  This was then postdilated with a 10 mm diameter high-pressure balloon to 28 atm.  Completion angiogram showed only about a 10 to 15% residual stenosis.   Based on the completion imaging, no further intervention is necessary.  The wire and balloon were removed from the sheath.  A 4-0 Monocryl purse-string suture was sewn around the sheath.  The sheath was removed while tying down the suture.  A sterile bandage was applied to the puncture site.  COMPLICATIONS: None  CONDITION: Stable   [name]  10/01/2017 9:46 AM   This note was created with Dragon Medical transcription system. Any errors in dictation are purely unintentional.

## 2017-10-01 NOTE — H&P (Signed)
[facility] History & Physical Update  The patient was interviewed and re-examined.  The patient's previous History and Physical has been reviewed and is unchanged.  There is no change in the plan of care. We plan to proceed with the scheduled procedure.  [name], MD  10/01/2017, 9:05 AM

## 2017-10-05 NOTE — H&P (Signed)
[facility] Admission History & Physical  MRN : [id number]  [name] [name] is a 42 y.o. ([birth year]) male who presents with chief complaint of No chief complaint on file. Marland Kitchen  History of Present Illness: I am asked to evaluate the patient by the dialysis center. The patient was sent here because they were unable to achieve adequate dialysis this morning. Furthermore the Center states there has been prolonged bleeding and the skin is becoming thin. The patient states there there have been increasing problems with the access, such as "pulling clots" during dialysis and prolonged bleeding after decannulation. The patient estimates these problems have been going on for several weeks. The patient is unaware of any other change.  Patient denies pain or tenderness overlying the access.  There is no pain with dialysis.  The patient denies hand pain or finger pain consistent with steal syndrome.   There have been many past interventions or declots of this access.  The patient is not chronically hypotensive on dialysis.  No current facility-administered medications for this encounter.    Current Outpatient Medications  Medication Sig Dispense Refill  . amLODipine (NORVASC) 10 MG tablet Take 1 tablet by mouth every evening. (1700)    . aspirin EC 81 MG tablet Take 81 mg by mouth daily at 6 PM. (1700)    . calcium acetate (PHOSLO) 667 MG capsule Take 1,334 mg by mouth 3 (three) times daily with meals.     . carvedilol (COREG) 25 MG tablet Take 25 mg by mouth every evening. (1700)    . clindamycin (CLEOCIN T) 1 % lotion Apply 1 application topically 2 (two) times daily as needed (for skin blemishes.).     Marland Kitchen HUMULIN 70/30 (70-30) 100 UNIT/ML injection Inject 10-30 Units into the skin See admin instructions. 10 units before breakfast and 30 units before super    . lidocaine-prilocaine (EMLA) cream Apply 1 application topically daily as needed (for port access).     Marland Kitchen losartan  (COZAAR) 100 MG tablet Take 100 mg by mouth every evening. (1700)    . pantoprazole (PROTONIX) 40 MG tablet Take 40 mg by mouth every evening. 1700    . Polyethyl Glycol-Propyl Glycol (LUBRICANT EYE DROPS) 0.4-0.3 % SOLN Place 1 drop into both eyes 3 (three) times daily as needed (for dry eyes.).    Marland Kitchen SENSIPAR 60 MG tablet Take 60 mg by mouth daily at 6 PM.   3  . cyclobenzaprine (FLEXERIL) 10 MG tablet Take 1 tablet (10 mg total) by mouth 3 (three) times daily as needed. (Patient not taking: Reported on 09/29/2017) 15 tablet 0    Past Medical History:  Diagnosis Date  . CHF (congestive heart failure) ([place])   . Diabetes mellitus without complication ([place])   . GERD (gastroesophageal reflux disease)   . Hypertension   . Renal disorder    on dialysis for 4 years three times a week last was Tuesday  . Shortness of breath dyspnea     Past Surgical History:  Procedure Laterality Date  . A/V FISTULAGRAM Left 10/01/2017   Procedure: A/V FISTULAGRAM;  Surgeon: Algernon Huxley, MD;  Location: [place] [facility];  Service: Cardiovascular;  Laterality: Left;  . COLONOSCOPY WITH PROPOFOL N/A 04/23/2017   Procedure: COLONOSCOPY WITH PROPOFOL;  Surgeon: [name], MD;  Location[facility] ENDOSCOPY;  Service: Endoscopy;  Laterality: N/A;  . COLONOSCOPY WITH PROPOFOL N/A 07/02/2017   Procedure: COLONOSCOPY WITH PROPOFOL;  Surgeon: [name], MD;  Location: [facility] ENDOSCOPY;  Service: Endoscopy;  Laterality: N/A;  . ESOPHAGOGASTRODUODENOSCOPY (EGD) WITH PROPOFOL N/A 04/23/2017   Procedure: ESOPHAGOGASTRODUODENOSCOPY (EGD) WITH PROPOFOL;  Surgeon: [name], MD;  Location: [facility] ENDOSCOPY;  Service: Endoscopy;  Laterality: N/A;  . EYE SURGERY    . PERIPHERAL VASCULAR CATHETERIZATION N/A 05/07/2015   Procedure: A/V Shuntogram/Fistulagram;  Surgeon: Katha Cabal, MD;  Location: [place] [facility];  Service: Cardiovascular;  Laterality: N/A;  . PERIPHERAL VASCULAR CATHETERIZATION Left  05/07/2015   Procedure: A/V Shunt Intervention;  Surgeon: Katha Cabal, MD;  Location: [place] [facility];  Service: Cardiovascular;  Laterality: Left;  . TEE WITHOUT CARDIOVERSION N/A 07/05/2015   Procedure: TRANSESOPHAGEAL ECHOCARDIOGRAM (TEE);  Surgeon: Dionisio David, MD;  Location: [facility] ORS;  Service: Cardiovascular;  Laterality: N/A;    Social History Social History   Tobacco Use  . Smoking status: Never Smoker  . Smokeless tobacco: Never Used  Substance Use Topics  . Alcohol use: Yes    Comment: occ. shot  . Drug use: No    Family History History reviewed. No pertinent family history.  No family history of bleeding or clotting disorders, autoimmune disease or porphyria  Allergies  Allergen Reactions  . Ivp Dye [Iodinated Diagnostic Agents] Diarrhea  . Lisinopril Other (See Comments)    Pt cannot recall reaction     REVIEW OF SYSTEMS (Negative unless checked)  Constitutional: [] Weight loss  [] Fever  [] Chills Cardiac: [] Chest pain   [] Chest pressure   [] Palpitations   [] Shortness of breath when laying flat   [] Shortness of breath at rest   [x] Shortness of breath with exertion. Vascular:  [] Pain in legs with walking   [] Pain in legs at rest   [] Pain in legs when laying flat   [] Claudication   [] Pain in feet when walking  [] Pain in feet at rest  [] Pain in feet when laying flat   [] History of DVT   [] Phlebitis   [] Swelling in legs   [] Varicose veins   [] Non-healing ulcers Pulmonary:   [] Uses home oxygen   [] Productive cough   [] Hemoptysis   [] Wheeze  [] COPD   [] Asthma Neurologic:  [] Dizziness  [] Blackouts   [] Seizures   [] History of stroke   [] History of TIA  [] Aphasia   [] Temporary blindness   [] Dysphagia   [] Weakness or numbness in arms   [] Weakness or numbness in legs Musculoskeletal:  [x] Arthritis   [] Joint swelling   [x] Joint pain   [] Low back pain Hematologic:  [] Easy bruising  [] Easy bleeding   [] Hypercoagulable state   [x] Anemic  [] Hepatitis Gastrointestinal:   [] Blood in stool   [] Vomiting blood  [] Gastroesophageal reflux/heartburn   [] Difficulty swallowing. Genitourinary:  [x] Chronic kidney disease   [] Difficult urination  [] Frequent urination  [] Burning with urination   [] Blood in urine Skin:  [] Rashes   [] Ulcers   [] Wounds Psychological:  [] History of anxiety   []  History of major depression.  Physical Examination  Vitals:   10/01/17 1030 10/01/17 1045 10/01/17 1050 10/01/17 1115  BP: 125/89  124/80   Pulse: 77 77 78 77  Resp: 11 10 17 14   Temp:      TempSrc:      SpO2: 96% 93% 90% 97%  Weight:      Height:       Body mass index is 25.68 kg/m. Gen: WD/WN, NAD Head: [place]/AT, No temporalis wasting.  Ear/Nose/Throat: Hearing grossly intact, nares w/o erythema or drainage, oropharynx w/o Erythema/Exudate,  Eyes: Conjunctiva clear, sclera non-icteric Neck: Trachea midline.  No JVD.  Pulmonary:  Good air movement, respirations not labored, no use of accessory muscles.  Cardiac: RRR, normal S1, S2. Vascular: aneurysmal AVF with thinning skin at the access sites.  + Thrill Vessel Right Left  Radial Palpable Palpable               Musculoskeletal: M/S 5/5 throughout.  Extremities without ischemic changes.  No deformity or atrophy.  Neurologic: Sensation grossly intact in extremities.  Symmetrical.  Speech is fluent. Motor exam as listed above. Psychiatric: Judgment intact, Mood & affect appropriate for pt's clinical situation. Dermatologic: No rashes or ulcers noted.  No cellulitis or open wounds.    CBC Lab Results  Component Value Date   WBC 9.8 05/07/2015   HGB 11.4 (L) 05/07/2015   HCT 34.8 (L) 05/07/2015   MCV 83.9 05/07/2015   PLT 198 05/07/2015    BMET    Component Value Date/Time   NA 138 05/07/2015 0937   NA 138 02/24/2014 0359   K 4.2 05/07/2015 0937   K 3.5 02/24/2014 0359   CL 100 (L) 05/07/2015 0937   CL 97 (L) 02/24/2014 0359   CO2 22 05/07/2015 0937   CO2 31 02/24/2014 0359   GLUCOSE 144 (H) 05/07/2015  0937   GLUCOSE 231 (H) 02/24/2014 0359   BUN 75 (H) 05/07/2015 0937   BUN 38 (H) 02/24/2014 0359   CREATININE 17.55 (H) 05/07/2015 0937   CREATININE 12.04 (H) 02/24/2014 0359   CALCIUM 7.7 (L) 05/07/2015 0937   CALCIUM 7.9 (L) 02/24/2014 0359   GFRNONAA 3 (L) 05/07/2015 0937   GFRNONAA 5 (L) 02/24/2014 0359   GFRAA 3 (L) 05/07/2015 0937   GFRAA 6 (L) 02/24/2014 0359   CrCl cannot be calculated (Patient's most recent lab result is older than the maximum 21 days allowed.).  COAG Lab Results  Component Value Date   INR 1.28 06/28/2015   INR 1.04 05/07/2015   INR 1.0 02/22/2014    Radiology No results found.  Assessment/Plan 1.  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 The patient will undergo angiography and correction of any problems using interventional techniques with the hope of restoring function to the access.  The risks and benefits were described to the patient.  All questions were answered.  The patient agrees to proceed with angiography and intervention. Potassium will be drawn to ensure that it is an appropriate level prior to performing intervention. 2.  End-stage renal disease requiring hemodialysis:  Patient will continue dialysis therapy without further interruption if a successful intervention is not achieved then a tunneled catheter will be placed. Dialysis has already been arranged. 3.  Hypertension:  Patient will continue medical management; nephrology is following no changes in oral medications. 4. Diabetes mellitus:  Glucose will be monitored and oral medications been held this morning once the patient has undergone the patient's procedure po intake will be reinitiated and again Accu-Cheks will be used to assess the blood glucose level and treat as needed. The patient will be restarted on the patient's usual hypoglycemic regime    [name], MD  10/05/2017 1:04  PM

## 2017-10-06 ENCOUNTER — Encounter: Payer: Self-pay | Admitting: Vascular Surgery

## 2017-12-03 ENCOUNTER — Encounter (INDEPENDENT_AMBULATORY_CARE_PROVIDER_SITE_OTHER): Payer: Self-pay | Admitting: Vascular Surgery

## 2017-12-03 ENCOUNTER — Ambulatory Visit (INDEPENDENT_AMBULATORY_CARE_PROVIDER_SITE_OTHER): Payer: Medicare Other | Admitting: Vascular Surgery

## 2017-12-03 ENCOUNTER — Other Ambulatory Visit (INDEPENDENT_AMBULATORY_CARE_PROVIDER_SITE_OTHER): Payer: Self-pay | Admitting: Vascular Surgery

## 2017-12-03 ENCOUNTER — Ambulatory Visit (INDEPENDENT_AMBULATORY_CARE_PROVIDER_SITE_OTHER): Payer: Medicare Other

## 2017-12-03 VITALS — BP 127/80 | HR 89 | Resp 18 | Ht 74.0 in | Wt 287.0 lb

## 2017-12-03 DIAGNOSIS — Z992 Dependence on renal dialysis: Secondary | ICD-10-CM | POA: Diagnosis not present

## 2017-12-03 DIAGNOSIS — N186 End stage renal disease: Secondary | ICD-10-CM

## 2017-12-03 DIAGNOSIS — Z9862 Peripheral vascular angioplasty status: Secondary | ICD-10-CM

## 2017-12-03 DIAGNOSIS — I872 Venous insufficiency (chronic) (peripheral): Secondary | ICD-10-CM

## 2017-12-03 NOTE — Progress Notes (Signed)
Subjective:    Patient ID: [name] [name], male    DOB: 08/29/1975, 42 y.o.   MRN: [id number] Chief Complaint  Patient presents with  . Follow-up    [facility] 6wk HDA   Patient presents for his first post procedure follow-up.  The patient is status post a left upper extremity fistulogram with PTA and stent placement on October 01, 2017.  The patient presents today stating an improvement in his dialysis access function. The patient underwent a duplex ultrasound of the AV access which was notable for a patent fistula without any significant hemodynamic stenosis.  Total flow volume is 2833.  Stents are patent. The patient denies any issues with hemodialysis such as cannulation problems, increased bleeding, decrease in doppler flow or recirculation. The patient also denies any fistula skin breakdown, pain, edema, pallor or ulceration of the arm / hand.  The patient denies any fever, nausea vomiting.  Review of Systems  Constitutional: Negative.   HENT: Negative.   Eyes: Negative.   Respiratory: Negative.   Cardiovascular: Negative.   Gastrointestinal: Negative.   Endocrine: Negative.   Genitourinary:       ESRD  Musculoskeletal: Negative.   Skin: Negative.   Allergic/Immunologic: Negative.   Neurological: Negative.   Hematological: Negative.   Psychiatric/Behavioral: Negative.       Objective:   Physical Exam  Constitutional: He is oriented to person, place, and time. He appears well-developed and well-nourished. No distress.  HENT:  Head: Normocephalic and atraumatic.  Right Ear: External ear normal.  Left Ear: External ear normal.  Eyes: Pupils are equal, round, and reactive to light. Conjunctivae and EOM are normal.  Neck: Normal range of motion.  Cardiovascular: Normal rate, regular rhythm, normal heart sounds and intact distal pulses.  Pulses:      Radial pulses are 2+ on the right side, and 2+ on the left side.  Left upper extremity dialysis access: Skin is intact.  Good bruit  and thrill.  Pulmonary/Chest: Effort normal and breath sounds normal.  Musculoskeletal: Normal range of motion. He exhibits no edema.  Neurological: He is alert and oriented to person, place, and time.  Skin: Skin is warm and dry. He is not diaphoretic.  Psychiatric: He has a normal mood and affect. His behavior is normal. Judgment and thought content normal.   BP 127/80 (BP Location: Right Arm)   Pulse 89   Resp 18   Ht 6\' 2"  (1.88 m)   Wt 287 lb (130.2 kg)   BMI 36.85 kg/m   Past Medical History:  Diagnosis Date  . CHF (congestive heart failure) ([place])   . Diabetes mellitus without complication ([place])   . GERD (gastroesophageal reflux disease)   . Hypertension   . Renal disorder    on dialysis for 4 years three times a week last was Tuesday  . Shortness of breath dyspnea    Social History   Socioeconomic History  . Marital status: Single    Spouse name: Not on file  . Number of children: Not on file  . Years of education: Not on file  . Highest education level: Not on file  Occupational History  . Not on file  Social Needs  . Financial resource strain: Not on file  . Food insecurity:    Worry: Not on file    Inability: Not on file  . Transportation needs:    Medical: Not on file    Non-medical: Not on file  Tobacco Use  . Smoking status:  Never Smoker  . Smokeless tobacco: Never Used  Substance and Sexual Activity  . Alcohol use: Yes    Comment: occ. shot  . Drug use: No  . Sexual activity: Not on file  Lifestyle  . Physical activity:    Days per week: Not on file    Minutes per session: Not on file  . Stress: Not on file  Relationships  . Social connections:    Talks on phone: Not on file    Gets together: Not on file    Attends religious service: Not on file    Active member of club or organization: Not on file    Attends meetings of clubs or organizations: Not on file    Relationship status: Not on file  . Intimate partner violence:    Fear of  current or ex partner: Not on file    Emotionally abused: Not on file    Physically abused: Not on file    Forced sexual activity: Not on file  Other Topics Concern  . Not on file  Social History Narrative  . Not on file   Past Surgical History:  Procedure Laterality Date  . A/V FISTULAGRAM Left 10/01/2017   Procedure: A/V FISTULAGRAM;  Surgeon: Algernon Huxley, MD;  Location: [place] [facility];  Service: Cardiovascular;  Laterality: Left;  . COLONOSCOPY WITH PROPOFOL N/A 04/23/2017   Procedure: COLONOSCOPY WITH PROPOFOL;  Surgeon: [name], MD;  Location: [facility] ENDOSCOPY;  Service: Endoscopy;  Laterality: N/A;  . COLONOSCOPY WITH PROPOFOL N/A 07/02/2017   Procedure: COLONOSCOPY WITH PROPOFOL;  Surgeon: [name], MD;  Location: [facility] ENDOSCOPY;  Service: Endoscopy;  Laterality: N/A;  . ESOPHAGOGASTRODUODENOSCOPY (EGD) WITH PROPOFOL N/A 04/23/2017   Procedure: ESOPHAGOGASTRODUODENOSCOPY (EGD) WITH PROPOFOL;  Surgeon: [name], MD;  Location: [facility] ENDOSCOPY;  Service: Endoscopy;  Laterality: N/A;  . EYE SURGERY    . PERIPHERAL VASCULAR CATHETERIZATION N/A 05/07/2015   Procedure: A/V Shuntogram/Fistulagram;  Surgeon: Katha Cabal, MD;  Location: [place] [facility];  Service: Cardiovascular;  Laterality: N/A;  . PERIPHERAL VASCULAR CATHETERIZATION Left 05/07/2015   Procedure: A/V Shunt Intervention;  Surgeon: Katha Cabal, MD;  Location: [place] [facility];  Service: Cardiovascular;  Laterality: Left;  . TEE WITHOUT CARDIOVERSION N/A 07/05/2015   Procedure: TRANSESOPHAGEAL ECHOCARDIOGRAM (TEE);  Surgeon: Dionisio David, MD;  Location: [facility] ORS;  Service: Cardiovascular;  Laterality: N/A;   No family history on file.  Allergies  Allergen Reactions  . Ivp Dye [Iodinated Diagnostic Agents] Diarrhea  . Lisinopril Other (See Comments)    Pt cannot recall reaction      Assessment & Plan:  Patient presents for his first post procedure follow-up.  The patient  is status post a left upper extremity fistulogram with PTA and stent placement on October 01, 2017.  The patient presents today stating an improvement in his dialysis access function. The patient underwent a duplex ultrasound of the AV access which was notable for a patent fistula without any significant hemodynamic stenosis.  Total flow volume is 2833.  Stents are patent. The patient denies any issues with hemodialysis such as cannulation problems, increased bleeding, decrease in doppler flow or recirculation. The patient also denies any fistula skin breakdown, pain, edema, pallor or ulceration of the arm / hand.  The patient denies any fever, nausea vomiting.  1. ESRD on dialysis ([place]) - Stable Patient presents for his first post procedure follow-up. Patient is status post a left upper extremity fistulogram  with intervention on October 01, 2017 Patient reports an improvement in the functioning of his dialysis access since his recent intervention Duplex ultrasound of the AV access shows a patent access with no evidence of hemodynamically significant strictures or stenosis.  The patient should continue to have duplex ultrasounds of the dialysis access every six months. The patient was instructed to call the office in the interim if any issues with dialysis access / doppler flow, pain, edema, pallor, fistula skin breakdown or ulceration of the arm / hand occur. The patient expressed their understanding.  - VAS US DUPLEX DIALYSIS ACCESS (AVF,AVG); Future  2. Chronic venous insufficiency - Stable Patient presents asymptomatically today Conservative therapy seems to be controlling his symptoms  Current Outpatient Medications on File Prior to Visit  Medication Sig Dispense Refill  . amLODipine (NORVASC) 10 MG tablet Take 1 tablet by mouth every evening. (1700)    . aspirin EC 81 MG tablet Take 81 mg by mouth daily at 6 PM. (1700)    . calcium acetate (PHOSLO) 667 MG capsule Take 1,334 mg by mouth 3 (three)  times daily with meals.     . carvedilol (COREG) 25 MG tablet Take 25 mg by mouth every evening. (1700)    . clindamycin (CLEOCIN T) 1 % lotion Apply 1 application topically 2 (two) times daily as needed (for skin blemishes.).     Marland Kitchen HUMULIN 70/30 (70-30) 100 UNIT/ML injection Inject 10-30 Units into the skin See admin instructions. 10 units before breakfast and 30 units before super    . lidocaine-prilocaine (EMLA) cream Apply 1 application topically daily as needed (for port access).     Marland Kitchen losartan (COZAAR) 100 MG tablet Take 100 mg by mouth every evening. (1700)    . pantoprazole (PROTONIX) 40 MG tablet Take 40 mg by mouth every evening. 1700    . Polyethyl Glycol-Propyl Glycol (LUBRICANT EYE DROPS) 0.4-0.3 % SOLN Place 1 drop into both eyes 3 (three) times daily as needed (for dry eyes.).    Marland Kitchen SENSIPAR 60 MG tablet Take 60 mg by mouth daily at 6 PM.   3  . cyclobenzaprine (FLEXERIL) 10 MG tablet Take 1 tablet (10 mg total) by mouth 3 (three) times daily as needed. (Patient not taking: Reported on 09/29/2017) 15 tablet 0   No current facility-administered medications on file prior to visit.     There are no Patient Instructions on file for this visit. No follow-ups on file.   Marcayla Budge A Wilmar Prabhakar, PA-C

## 2018-06-14 ENCOUNTER — Encounter: Admission: RE | Payer: Self-pay | Source: Home / Self Care

## 2018-06-14 ENCOUNTER — Ambulatory Visit: Admission: RE | Admit: 2018-06-14 | Payer: Medicare Other | Source: Home / Self Care | Admitting: Ophthalmology

## 2018-06-14 SURGERY — PHACOEMULSIFICATION, CATARACT, WITH IOL INSERTION
Anesthesia: Topical | Laterality: Right

## 2018-06-17 ENCOUNTER — Ambulatory Visit (INDEPENDENT_AMBULATORY_CARE_PROVIDER_SITE_OTHER): Payer: Medicare Other | Admitting: Nurse Practitioner

## 2018-06-17 ENCOUNTER — Ambulatory Visit (INDEPENDENT_AMBULATORY_CARE_PROVIDER_SITE_OTHER): Payer: Medicare Other

## 2018-06-17 ENCOUNTER — Encounter (INDEPENDENT_AMBULATORY_CARE_PROVIDER_SITE_OTHER): Payer: Self-pay | Admitting: Nurse Practitioner

## 2018-06-17 ENCOUNTER — Other Ambulatory Visit: Payer: Self-pay

## 2018-06-17 VITALS — BP 112/69 | HR 83 | Resp 16 | Ht 74.0 in | Wt 288.0 lb

## 2018-06-17 DIAGNOSIS — I10 Essential (primary) hypertension: Secondary | ICD-10-CM

## 2018-06-17 DIAGNOSIS — Z794 Long term (current) use of insulin: Secondary | ICD-10-CM

## 2018-06-17 DIAGNOSIS — Z992 Dependence on renal dialysis: Secondary | ICD-10-CM | POA: Diagnosis not present

## 2018-06-17 DIAGNOSIS — N186 End stage renal disease: Secondary | ICD-10-CM | POA: Diagnosis not present

## 2018-06-17 DIAGNOSIS — I120 Hypertensive chronic kidney disease with stage 5 chronic kidney disease or end stage renal disease: Secondary | ICD-10-CM

## 2018-06-17 DIAGNOSIS — E1122 Type 2 diabetes mellitus with diabetic chronic kidney disease: Secondary | ICD-10-CM

## 2018-06-18 ENCOUNTER — Encounter (INDEPENDENT_AMBULATORY_CARE_PROVIDER_SITE_OTHER): Payer: Self-pay | Admitting: Nurse Practitioner

## 2018-06-18 NOTE — Progress Notes (Signed)
SUBJECTIVE:  Patient ID: [name] [name], male    DOB: December 12, 1975, 43 y.o.   MRN: [id number] Chief Complaint  Patient presents with  . Follow-up    39month HDA    HPI  [name] [name] is a 43 y.o. male presents today for his 30-month evaluation of his left upper extremity brachiocephalic AV fistula.  He denies fever, chills, nausea, vomiting or diarrhea while on dialysis.  He denies any issues with flow times, hold times, or issues with clotting while on the machine.  He does endorse having an intense cramping sensation and pain while he is on dialysis.  He states that at times it causes his hearing to draw into a fist and he rates the pain as severe.  He underwent a upper extremity hemodialysis access duplex which revealed a flow volume of 1713.  There was a small fluid collection noted in the shoulder area around the anterior aspect of the outflow vein without any extravascular flow.  There were no areas of significant internal vessel narrowing or stenosis.  The left distal radial arteries appear consistent with significant steal. Past Medical History:  Diagnosis Date  . CHF (congestive heart failure) ([place])   . Diabetes mellitus without complication ([place])   . GERD (gastroesophageal reflux disease)   . Hypertension   . Renal disorder    on dialysis for 4 years three times a week last was Tuesday  . Shortness of breath dyspnea     Past Surgical History:  Procedure Laterality Date  . A/V FISTULAGRAM Left 10/01/2017   Procedure: A/V FISTULAGRAM;  Surgeon: Algernon Huxley, MD;  Location: [place] [facility];  Service: Cardiovascular;  Laterality: Left;  . COLONOSCOPY WITH PROPOFOL N/A 04/23/2017   Procedure: COLONOSCOPY WITH PROPOFOL;  Surgeon: [name], MD;  Location: [facility] ENDOSCOPY;  Service: Endoscopy;  Laterality: N/A;  . COLONOSCOPY WITH PROPOFOL N/A 07/02/2017   Procedure: COLONOSCOPY WITH PROPOFOL;  Surgeon: [name], MD;  Location: [facility] ENDOSCOPY;  Service:  Endoscopy;  Laterality: N/A;  . ESOPHAGOGASTRODUODENOSCOPY (EGD) WITH PROPOFOL N/A 04/23/2017   Procedure: ESOPHAGOGASTRODUODENOSCOPY (EGD) WITH PROPOFOL;  Surgeon: [name], MD;  Location: [facility] ENDOSCOPY;  Service: Endoscopy;  Laterality: N/A;  . EYE SURGERY    . PERIPHERAL VASCULAR CATHETERIZATION N/A 05/07/2015   Procedure: A/V Shuntogram/Fistulagram;  Surgeon: Katha Cabal, MD;  Location: [place] [facility];  Service: Cardiovascular;  Laterality: N/A;  . PERIPHERAL VASCULAR CATHETERIZATION Left 05/07/2015   Procedure: A/V Shunt Intervention;  Surgeon: Katha Cabal, MD;  Location: [place] [facility];  Service: Cardiovascular;  Laterality: Left;  . TEE WITHOUT CARDIOVERSION N/A 07/05/2015   Procedure: TRANSESOPHAGEAL ECHOCARDIOGRAM (TEE);  Surgeon: Dionisio David, MD;  Location: [facility] ORS;  Service: Cardiovascular;  Laterality: N/A;    Social History   Socioeconomic History  . Marital status: Single    Spouse name: Not on file  . Number of children: Not on file  . Years of education: Not on file  . Highest education level: Not on file  Occupational History  . Not on file  Social Needs  . Financial resource strain: Not on file  . Food insecurity:    Worry: Not on file    Inability: Not on file  . Transportation needs:    Medical: Not on file    Non-medical: Not on file  Tobacco Use  . Smoking status: Never Smoker  . Smokeless tobacco: Never Used  Substance and Sexual Activity  . Alcohol  use: Yes    Comment: occ. shot  . Drug use: No  . Sexual activity: Not on file  Lifestyle  . Physical activity:    Days per week: Not on file    Minutes per session: Not on file  . Stress: Not on file  Relationships  . Social connections:    Talks on phone: Not on file    Gets together: Not on file    Attends religious service: Not on file    Active member of club or organization: Not on file    Attends meetings of clubs or organizations: Not on file    Relationship  status: Not on file  . Intimate partner violence:    Fear of current or ex partner: Not on file    Emotionally abused: Not on file    Physically abused: Not on file    Forced sexual activity: Not on file  Other Topics Concern  . Not on file  Social History Narrative  . Not on file    History reviewed. No pertinent family history.  Allergies  Allergen Reactions  . Ivp Dye [Iodinated Diagnostic Agents] Diarrhea  . Lisinopril Other (See Comments)    Pt cannot recall reaction     Review of Systems   Review of Systems: Negative Unless Checked Constitutional: [] Weight loss  [] Fever  [] Chills Cardiac: [] Chest pain   []  Atrial Fibrillation  [] Palpitations   [] Shortness of breath when laying flat   [] Shortness of breath with exertion. [] Shortness of breath at rest Vascular:  [] Pain in legs with walking   [] Pain in legs with standing [] Pain in legs when laying flat   [] Claudication    [] Pain in feet when laying flat    [] History of DVT   [] Phlebitis   [x] Swelling in legs   [] Varicose veins   [] Non-healing ulcers Pulmonary:   [] Uses home oxygen   [] Productive cough   [] Hemoptysis   [] Wheeze  [] COPD   [] Asthma Neurologic:  [] Dizziness   [] Seizures  [] Blackouts [] History of stroke   [] History of TIA  [] Aphasia   [] Temporary Blindness   [] Weakness or numbness in arm   [] Weakness or numbness in leg Musculoskeletal:   [] Joint swelling   [] Joint pain   [] Low back pain  []  History of Knee Replacement [] Arthritis [] back Surgeries  []  Spinal Stenosis    Hematologic:  [] Easy bruising  [] Easy bleeding   [] Hypercoagulable state   [x] Anemic Gastrointestinal:  [] Diarrhea   [] Vomiting  [] Gastroesophageal reflux/heartburn   [] Difficulty swallowing. [] Abdominal pain Genitourinary:  [x] Chronic kidney disease   [] Difficult urination  [] Anuric   [] Blood in urine [] Frequent urination  [] Burning with urination   [] Hematuria Skin:  [] Rashes   [] Ulcers [] Wounds Psychological:  [] History of anxiety   []  History of  major depression  []  Memory Difficulties      OBJECTIVE:   Physical Exam  BP 112/69 (BP Location: Right Arm)   Pulse 83   Resp 16   Ht 6\' 2"  (1.88 m)   Wt 288 lb (130.6 kg)   BMI 36.98 kg/m   Gen: WD/WN, NAD Head: [place]/AT, No temporalis wasting.  Ear/Nose/Throat: Hearing grossly intact, nares w/o erythema or drainage Eyes: PER, EOMI, sclera nonicteric.  Neck: Supple, no masses.  No JVD.  Pulmonary:  Good air movement, no use of accessory muscles.  Cardiac: RRR Vascular: good thrill and bruit Vessel Right Left  Radial Palpable Palpable   Gastrointestinal: soft, non-distended. No guarding/no peritoneal signs.  Musculoskeletal: M/S 5/5 throughout.  No  deformity or atrophy.  Neurologic: Pain and light touch intact in extremities.  Symmetrical.  Speech is fluent. Motor exam as listed above. Psychiatric: Judgment intact, Mood & affect appropriate for pt's clinical situation. Dermatologic: No Venous rashes. No Ulcers Noted.  No changes consistent with cellulitis. Lymph : No Cervical lymphadenopathy, no lichenification or skin changes of chronic lymphedema.       ASSESSMENT AND PLAN:  1. ESRD on dialysis [facility]) Patient is describing typical signs symptoms of steal phenomena.  We will proceed with a left upper extremity angiogram in order to determine possible causes as well as a place to repair.  The risks and benefits as well as the alternative therapies was discussed in detail with the patient.  All questions were answered.  Patient agrees to proceed with angiography.  The patient will follow up with me in the office after the procedure.   2. Type 2 diabetes mellitus with chronic kidney disease on chronic dialysis, with long-term current use of insulin (HCC) Continue hypoglycemic medications as already ordered, these medications have been reviewed and there are no changes at this time.  Hgb A1C to be monitored as already arranged by primary service   3. Essential  hypertension Continue antihypertensive medications as already ordered, these medications have been reviewed and there are no changes at this time.    Current Outpatient Medications on File Prior to Visit  Medication Sig Dispense Refill  . aspirin EC 81 MG tablet Take 81 mg by mouth daily at 6 PM. (1700)    . calcium acetate (PHOSLO) 667 MG capsule Take 1,334 mg by mouth 3 (three) times daily with meals.     . carvedilol (COREG) 25 MG tablet Take 25 mg by mouth every evening. (1700)    . HUMULIN 70/30 (70-30) 100 UNIT/ML injection Inject 10-30 Units into the skin See admin instructions. 10 units before breakfast and 30 units before super    . losartan (COZAAR) 100 MG tablet Take 50 mg by mouth every evening. (1700)    . pantoprazole (PROTONIX) 40 MG tablet Take 40 mg by mouth every evening. 1700    . Polyethyl Glycol-Propyl Glycol (LUBRICANT EYE DROPS) 0.4-0.3 % SOLN Place 1 drop into both eyes 3 (three) times daily as needed (for dry eyes.).    Marland Kitchen SENSIPAR 60 MG tablet Take 60 mg by mouth daily at 6 PM.   3  . amLODipine (NORVASC) 10 MG tablet Take 1 tablet by mouth every evening. (1700)    . clindamycin (CLEOCIN T) 1 % lotion Apply 1 application topically 2 (two) times daily as needed (for skin blemishes.).     Marland Kitchen cyclobenzaprine (FLEXERIL) 10 MG tablet Take 1 tablet (10 mg total) by mouth 3 (three) times daily as needed. (Patient not taking: Reported on 09/29/2017) 15 tablet 0  . lidocaine-prilocaine (EMLA) cream Apply 1 application topically daily as needed (for port access).      No current facility-administered medications on file prior to visit.     There are no Patient Instructions on file for this visit. No follow-ups on file.   Kris Hartmann, NP  This note was completed with Sales executive.  Any errors are purely unintentional.

## 2018-06-21 ENCOUNTER — Encounter (INDEPENDENT_AMBULATORY_CARE_PROVIDER_SITE_OTHER): Payer: Self-pay

## 2018-06-21 ENCOUNTER — Encounter: Payer: Self-pay | Admitting: *Deleted

## 2018-06-21 ENCOUNTER — Other Ambulatory Visit (INDEPENDENT_AMBULATORY_CARE_PROVIDER_SITE_OTHER): Payer: Self-pay | Admitting: Vascular Surgery

## 2018-06-23 MED ORDER — CEFAZOLIN SODIUM-DEXTROSE 1-4 GM/50ML-% IV SOLN
1.0000 g | Freq: Once | INTRAVENOUS | Status: AC
  Administered 2018-06-24: 1 g via INTRAVENOUS

## 2018-06-24 ENCOUNTER — Other Ambulatory Visit: Payer: Self-pay

## 2018-06-24 ENCOUNTER — Ambulatory Visit
Admission: RE | Admit: 2018-06-24 | Discharge: 2018-06-24 | Disposition: A | Discharge status: Home / Self Care | Payer: Medicare Other | Attending: Vascular Surgery | Admitting: Vascular Surgery

## 2018-06-24 ENCOUNTER — Encounter
Admission: RE | Disposition: A | Discharge status: Home / Self Care | Payer: Self-pay | Source: Home / Self Care | Attending: Vascular Surgery

## 2018-06-24 DIAGNOSIS — N186 End stage renal disease: Secondary | ICD-10-CM | POA: Insufficient documentation

## 2018-06-24 DIAGNOSIS — E1122 Type 2 diabetes mellitus with diabetic chronic kidney disease: Secondary | ICD-10-CM | POA: Insufficient documentation

## 2018-06-24 DIAGNOSIS — I509 Heart failure, unspecified: Secondary | ICD-10-CM | POA: Insufficient documentation

## 2018-06-24 DIAGNOSIS — T82898A Other specified complication of vascular prosthetic devices, implants and grafts, initial encounter: Secondary | ICD-10-CM | POA: Diagnosis present

## 2018-06-24 DIAGNOSIS — Z992 Dependence on renal dialysis: Secondary | ICD-10-CM | POA: Insufficient documentation

## 2018-06-24 DIAGNOSIS — I132 Hypertensive heart and chronic kidney disease with heart failure and with stage 5 chronic kidney disease, or end stage renal disease: Secondary | ICD-10-CM | POA: Insufficient documentation

## 2018-06-24 DIAGNOSIS — Z794 Long term (current) use of insulin: Secondary | ICD-10-CM | POA: Diagnosis not present

## 2018-06-24 DIAGNOSIS — Y841 Kidney dialysis as the cause of abnormal reaction of the patient, or of later complication, without mention of misadventure at the time of the procedure: Secondary | ICD-10-CM | POA: Insufficient documentation

## 2018-06-24 DIAGNOSIS — Z7982 Long term (current) use of aspirin: Secondary | ICD-10-CM | POA: Diagnosis not present

## 2018-06-24 DIAGNOSIS — Z79899 Other long term (current) drug therapy: Secondary | ICD-10-CM | POA: Diagnosis not present

## 2018-06-24 DIAGNOSIS — K219 Gastro-esophageal reflux disease without esophagitis: Secondary | ICD-10-CM | POA: Diagnosis not present

## 2018-06-24 HISTORY — PX: UPPER EXTREMITY ANGIOGRAPHY: CATH118270

## 2018-06-24 LAB — GLUCOSE, CAPILLARY
GLUCOSE-CAPILLARY: 95 mg/dL (ref 70–99)
Glucose-Capillary: 114 mg/dL — ABNORMAL HIGH (ref 70–99)

## 2018-06-24 LAB — POTASSIUM (ARMC VASCULAR LAB ONLY): Potassium (ARMC vascular lab): 4.9 (ref 3.5–5.1)

## 2018-06-24 SURGERY — UPPER EXTREMITY ANGIOGRAPHY
Anesthesia: Moderate Sedation | Laterality: Left

## 2018-06-24 MED ORDER — MIDAZOLAM HCL 2 MG/2ML IJ SOLN
INTRAMUSCULAR | Status: DC | PRN
  Administered 2018-06-24: 2 mg via INTRAVENOUS
  Administered 2018-06-24 (×2): 1 mg via INTRAVENOUS

## 2018-06-24 MED ORDER — SODIUM CHLORIDE 0.9 % IV SOLN
INTRAVENOUS | Status: DC
  Administered 2018-06-24: 1000 mL via INTRAVENOUS

## 2018-06-24 MED ORDER — HEPARIN SODIUM (PORCINE) 1000 UNIT/ML IJ SOLN
INTRAMUSCULAR | Status: DC | PRN
  Administered 2018-06-24: 3000 [IU] via INTRAVENOUS

## 2018-06-24 MED ORDER — FENTANYL CITRATE (PF) 100 MCG/2ML IJ SOLN
INTRAMUSCULAR | Status: AC
  Filled 2018-06-24: qty 2

## 2018-06-24 MED ORDER — LABETALOL HCL 5 MG/ML IV SOLN
INTRAVENOUS | Status: DC | PRN
  Administered 2018-06-24: 10 mg via INTRAVENOUS

## 2018-06-24 MED ORDER — FAMOTIDINE 20 MG PO TABS
ORAL_TABLET | ORAL | Status: AC
  Administered 2018-06-24: 40 mg via ORAL
  Filled 2018-06-24: qty 2

## 2018-06-24 MED ORDER — DIPHENHYDRAMINE HCL 50 MG/ML IJ SOLN
INTRAMUSCULAR | Status: AC
  Administered 2018-06-24: 25 mg via INTRAVENOUS
  Filled 2018-06-24: qty 1

## 2018-06-24 MED ORDER — METHYLPREDNISOLONE SODIUM SUCC 125 MG IJ SOLR
INTRAMUSCULAR | Status: AC
  Administered 2018-06-24: 125 mg via INTRAVENOUS
  Filled 2018-06-24: qty 2

## 2018-06-24 MED ORDER — MIDAZOLAM HCL 2 MG/ML PO SYRP: 8.0000 mg | ORAL_SOLUTION | Freq: Once | ORAL | Status: DC | PRN

## 2018-06-24 MED ORDER — MIDAZOLAM HCL 2 MG/2ML IJ SOLN
INTRAMUSCULAR | Status: AC
  Filled 2018-06-24: qty 4

## 2018-06-24 MED ORDER — HYDROMORPHONE HCL 1 MG/ML IJ SOLN: 1.0000 mg | Freq: Once | INTRAMUSCULAR | Status: DC | PRN

## 2018-06-24 MED ORDER — FENTANYL CITRATE (PF) 100 MCG/2ML IJ SOLN
INTRAMUSCULAR | Status: DC | PRN
  Administered 2018-06-24 (×2): 50 ug via INTRAVENOUS
  Administered 2018-06-24 (×2): 25 ug via INTRAVENOUS

## 2018-06-24 MED ORDER — CEFAZOLIN SODIUM-DEXTROSE 1-4 GM/50ML-% IV SOLN
INTRAVENOUS | Status: AC
  Filled 2018-06-24: qty 50

## 2018-06-24 MED ORDER — ONDANSETRON HCL 4 MG/2ML IJ SOLN: 4.0000 mg | Freq: Four times a day (QID) | INTRAMUSCULAR | Status: DC | PRN

## 2018-06-24 MED ORDER — IOHEXOL 300 MG/ML  SOLN
INTRAMUSCULAR | Status: DC | PRN
  Administered 2018-06-24: 100 mL via INTRAVENOUS

## 2018-06-24 MED ORDER — DIPHENHYDRAMINE HCL 50 MG/ML IJ SOLN
50.0000 mg | Freq: Once | INTRAMUSCULAR | Status: AC | PRN
  Administered 2018-06-24 (×2): 25 mg via INTRAVENOUS

## 2018-06-24 MED ORDER — LABETALOL HCL 5 MG/ML IV SOLN
INTRAVENOUS | Status: AC
  Filled 2018-06-24: qty 4

## 2018-06-24 MED ORDER — HEPARIN SODIUM (PORCINE) 1000 UNIT/ML IJ SOLN
INTRAMUSCULAR | Status: AC
  Filled 2018-06-24: qty 1

## 2018-06-24 MED ORDER — LIDOCAINE-EPINEPHRINE (PF) 1 %-1:200000 IJ SOLN
INTRAMUSCULAR | Status: AC
  Filled 2018-06-24: qty 30

## 2018-06-24 MED ORDER — FAMOTIDINE 20 MG PO TABS
40.0000 mg | ORAL_TABLET | ORAL | Status: DC | PRN
  Administered 2018-06-24: 40 mg via ORAL

## 2018-06-24 MED ORDER — METHYLPREDNISOLONE SODIUM SUCC 125 MG IJ SOLR
125.0000 mg | INTRAMUSCULAR | Status: DC | PRN
  Administered 2018-06-24: 125 mg via INTRAVENOUS

## 2018-06-24 SURGICAL SUPPLY — 11 items
CATH ANGIO 5F 100CM .035 PIG (CATHETERS) ×3 IMPLANT
CATH BEACON 5 .035 100 H1 TIP (CATHETERS) ×3 IMPLANT
CATH CXI SUPP ANG 4FR 135 (CATHETERS) ×1 IMPLANT
CATH CXI SUPP ANG 4FR 135CM (CATHETERS) ×3
DEVICE STARCLOSE SE CLOSURE (Vascular Products) ×3 IMPLANT
GUIDEWIRE ANGLED .035 180CM (WIRE) ×3 IMPLANT
PACK ANGIOGRAPHY (CUSTOM PROCEDURE TRAY) ×3 IMPLANT
SHEATH BRITE TIP 5FRX11 (SHEATH) ×3 IMPLANT
TUBING CONTRAST HIGH PRESS 72 (TUBING) ×3 IMPLANT
WIRE J 3MM .035X145CM (WIRE) ×3 IMPLANT
WIRE MAGIC TORQUE 260C (WIRE) ×3 IMPLANT

## 2018-06-24 NOTE — Op Note (Signed)
OPERATIVE REPORT   PREOPERATIVE DIAGNOSIS: 1. End-stage renal disease. 2. 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  POSTOPERATIVE DIAGNOSIS: Same as above  PROCEDURE PERFORMED: 1. Ultrasound guidance vascular access to right femoral artery. 2. Catheter placement to left radial artery and left ulnar arteries  from right femoral approach. 3. Thoracic aortogram and selective left upper extremity angiogram  including selective images of the radial and ulnar arteries. 4. StarClose closure device right femoral artery.  SURGEON: Algernon Huxley, MD  ANESTHESIA: Local with moderate conscious sedation for 35 minutes using 4 mg of Versed and 150 Mcg of Fentanyl  BLOOD LOSS: Minimal.  FLUOROSCOPY TIME:6 minutes  INDICATION FOR PROCEDURE: This is a 43 y.o.male who presented to our office with steal syndrome. The patient's left brachiocephalic AV fistula is working well, but their hand is numb and painful. To further evaluate this to determine what options would be possible to treat the steal syndrome, angiogram of the left upper extremity is indicated. Risks and benefits are discussed. Informed consent was obtained.  DESCRIPTION OF PROCEDURE: The patient was brought to the vascular suite. Moderate conscious sedation was administered during a face to face encounter with the patient throughout the procedure with my supervision of the RN administering medicines and monitoring the patient's vital signs, pulse oximetry, telemetry and mental status throughout from the start of the procedure until the patient was taken to the recovery room.  Groins were shaved and prepped and sterile surgical field was created. The right femoral head was localized with fluoroscopy and the right femoral artery was then visualized with ultrasound and found to be widely patent. It was then accessed under direct ultrasound guidance without difficulty with a  Seldinger needle and a permanent image was recorded. A J-wire and 5-French sheath were then placed. Pigtail catheter was placed into the ascending aorta and a thoracic aortogram was then performed in the LAO projection. This demonstrated normal origins to the great vessels without significant proximal stenoses and a normal configuration of the great vessels. The patient was given 3000 units of intravenous heparin and a headhunter catheter was used to selectively cannulate the left subclavian artery without difficulty. This was then sequentially advanced to the brachial artery and to the brachial bifurcation.  Steal was demonstrated with almost all of the flow from the artery going into the fistula and not downstream to the hand on images with the catheter proximal to the access. I then advanced a catheter beyond the access and into the radial and ulnar arteries.  The radial artery was entered first, but the catheter only went to the very origin of the radial artery, then exchanged for a CXI catheter to evaluate more distally in the radial artery. After this, the catheter was removed and exchanged for a Magic torque wire and then using the Magic torque wire and the CXI catheter the catheter was placed into the ulnar artery and this was evaluated. Findings in the left upper extremity showed no significant stenosis in the left subclavian, axillary, or brachial arteries.  Selective imaging of the radial artery showed inline flow to the hand without significant cross-filling through the palmar arch.  The flow was extremely sluggish with a significant amount of flow still going retrograde even with a direct injection in the radial artery.  Similarly, the ulnar artery was patent as was the interosseous artery.  There was a better palmar arch in the hand through the ulnar artery but it was still quite sluggish due  to the retrograde flow trying to go out the fistula.  There were no focal stenoses identified  that would be improved with treatment to improve his steal.  His best option for treatment of the steal would be banding of the AV fistula at this point as it was quite large.  The diagnostic catheter was removed. Oblique arteriogram was performed of the right femoral artery and StarClose closure device deployed in the usual fashion with excellent hemostatic result. The patient tolerated the procedure well and was taken to the recovery room in stable condition.   [name] [name] 06/24/2018 9:39 AM

## 2018-06-24 NOTE — H&P (Signed)
[facility] History & Physical Update  The patient was interviewed and re-examined.  The patient's previous History and Physical has been reviewed and is unchanged.  There is no change in the plan of care. We plan to proceed with the scheduled procedure.  [name], MD  06/24/2018, 8:15 AM

## 2018-06-25 ENCOUNTER — Telehealth (INDEPENDENT_AMBULATORY_CARE_PROVIDER_SITE_OTHER): Payer: Self-pay

## 2018-06-25 NOTE — Telephone Encounter (Signed)
Patient called wanting to know when should he remove the dressing from his groin from his procedure on 06/24/2018. Per Eulogio Ditch the patient can remove the dressing tomorrow, keep it clean and dry and place a bandage on the site to keep it covered. Patient seemed a little confused about using a bandage.

## 2018-06-30 ENCOUNTER — Other Ambulatory Visit: Payer: Self-pay

## 2018-06-30 ENCOUNTER — Ambulatory Visit: Payer: Medicare Other | Admitting: Anesthesiology

## 2018-06-30 ENCOUNTER — Encounter
Admission: RE | Disposition: A | Discharge status: Home / Self Care | Payer: Self-pay | Source: Home / Self Care | Attending: Ophthalmology

## 2018-06-30 ENCOUNTER — Encounter: Payer: Self-pay | Admitting: Emergency Medicine

## 2018-06-30 ENCOUNTER — Ambulatory Visit
Admission: RE | Admit: 2018-06-30 | Discharge: 2018-06-30 | Disposition: A | Discharge status: Home / Self Care | Payer: Medicare Other | Attending: Ophthalmology | Admitting: Ophthalmology

## 2018-06-30 DIAGNOSIS — E1122 Type 2 diabetes mellitus with diabetic chronic kidney disease: Secondary | ICD-10-CM | POA: Insufficient documentation

## 2018-06-30 DIAGNOSIS — Z7982 Long term (current) use of aspirin: Secondary | ICD-10-CM | POA: Insufficient documentation

## 2018-06-30 DIAGNOSIS — Z888 Allergy status to other drugs, medicaments and biological substances status: Secondary | ICD-10-CM | POA: Diagnosis not present

## 2018-06-30 DIAGNOSIS — H2511 Age-related nuclear cataract, right eye: Secondary | ICD-10-CM | POA: Diagnosis not present

## 2018-06-30 DIAGNOSIS — E1136 Type 2 diabetes mellitus with diabetic cataract: Secondary | ICD-10-CM | POA: Insufficient documentation

## 2018-06-30 DIAGNOSIS — Z992 Dependence on renal dialysis: Secondary | ICD-10-CM | POA: Diagnosis not present

## 2018-06-30 DIAGNOSIS — Z79899 Other long term (current) drug therapy: Secondary | ICD-10-CM | POA: Diagnosis not present

## 2018-06-30 DIAGNOSIS — N189 Chronic kidney disease, unspecified: Secondary | ICD-10-CM | POA: Insufficient documentation

## 2018-06-30 DIAGNOSIS — I130 Hypertensive heart and chronic kidney disease with heart failure and stage 1 through stage 4 chronic kidney disease, or unspecified chronic kidney disease: Secondary | ICD-10-CM | POA: Diagnosis not present

## 2018-06-30 DIAGNOSIS — Z794 Long term (current) use of insulin: Secondary | ICD-10-CM | POA: Insufficient documentation

## 2018-06-30 DIAGNOSIS — Z91041 Radiographic dye allergy status: Secondary | ICD-10-CM | POA: Insufficient documentation

## 2018-06-30 DIAGNOSIS — I509 Heart failure, unspecified: Secondary | ICD-10-CM | POA: Insufficient documentation

## 2018-06-30 DIAGNOSIS — I2510 Atherosclerotic heart disease of native coronary artery without angina pectoris: Secondary | ICD-10-CM | POA: Diagnosis not present

## 2018-06-30 HISTORY — DX: Keratosis follicularis et parafollicularis in cutem penetrans: L87.0

## 2018-06-30 HISTORY — DX: Atherosclerotic heart disease of native coronary artery without angina pectoris: I25.10

## 2018-06-30 HISTORY — PX: CATARACT EXTRACTION W/PHACO: SHX586

## 2018-06-30 LAB — POCT I-STAT 4, (NA,K, GLUC, HGB,HCT)
Glucose, Bld: 158 mg/dL — ABNORMAL HIGH (ref 70–99)
HCT: 40 % (ref 39.0–52.0)
HEMOGLOBIN: 13.6 g/dL (ref 13.0–17.0)
Potassium: 4.7 mmol/L (ref 3.5–5.1)
Sodium: 137 mmol/L (ref 135–145)

## 2018-06-30 LAB — GLUCOSE, CAPILLARY: Glucose-Capillary: 160 mg/dL — ABNORMAL HIGH (ref 70–99)

## 2018-06-30 SURGERY — PHACOEMULSIFICATION, CATARACT, WITH IOL INSERTION
Anesthesia: Monitor Anesthesia Care | Site: Eye | Laterality: Right

## 2018-06-30 MED ORDER — CARBACHOL 0.01 % IO SOLN
INTRAOCULAR | Status: DC | PRN
  Administered 2018-06-30: 0.5 mL via INTRAOCULAR

## 2018-06-30 MED ORDER — MIDAZOLAM HCL 2 MG/2ML IJ SOLN
INTRAMUSCULAR | Status: DC | PRN
  Administered 2018-06-30 (×3): 0.5 mg via INTRAVENOUS
  Administered 2018-06-30: 1 mg via INTRAVENOUS
  Administered 2018-06-30: .5 mg via INTRAVENOUS
  Administered 2018-06-30: 1 mg via INTRAVENOUS

## 2018-06-30 MED ORDER — ARMC OPHTHALMIC DILATING DROPS
1.0000 "application " | OPHTHALMIC | Status: AC
  Administered 2018-06-30 (×3): 1 via OPHTHALMIC

## 2018-06-30 MED ORDER — SODIUM HYALURONATE 23 MG/ML IO SOLN
INTRAOCULAR | Status: DC | PRN
  Administered 2018-06-30: 0.6 mL via INTRAOCULAR

## 2018-06-30 MED ORDER — POVIDONE-IODINE 5 % OP SOLN
OPHTHALMIC | Status: AC
  Filled 2018-06-30: qty 30

## 2018-06-30 MED ORDER — EPINEPHRINE PF 1 MG/ML IJ SOLN
INTRAMUSCULAR | Status: AC
  Filled 2018-06-30: qty 2

## 2018-06-30 MED ORDER — MOXIFLOXACIN HCL 0.5 % OP SOLN: 1.0000 [drp] | OPHTHALMIC | Status: DC | PRN

## 2018-06-30 MED ORDER — TETRACAINE HCL 0.5 % OP SOLN
1.0000 [drp] | OPHTHALMIC | Status: AC | PRN
  Administered 2018-06-30 (×3): 1 [drp] via OPHTHALMIC

## 2018-06-30 MED ORDER — ARMC OPHTHALMIC DILATING DROPS
OPHTHALMIC | Status: AC
  Administered 2018-06-30: 1 via OPHTHALMIC
  Filled 2018-06-30: qty 0.5

## 2018-06-30 MED ORDER — SODIUM CHLORIDE 0.9 % IV SOLN: INTRAVENOUS | Status: DC

## 2018-06-30 MED ORDER — POVIDONE-IODINE 5 % OP SOLN
OPHTHALMIC | Status: DC | PRN
  Administered 2018-06-30: 1 via OPHTHALMIC

## 2018-06-30 MED ORDER — LIDOCAINE HCL (PF) 4 % IJ SOLN
INTRAMUSCULAR | Status: AC
  Filled 2018-06-30: qty 5

## 2018-06-30 MED ORDER — MOXIFLOXACIN HCL 0.5 % OP SOLN
OPHTHALMIC | Status: DC | PRN
  Administered 2018-06-30: 0.2 mL via OPHTHALMIC

## 2018-06-30 MED ORDER — SODIUM HYALURONATE 23 MG/ML IO SOLN
INTRAOCULAR | Status: AC
  Filled 2018-06-30: qty 0.6

## 2018-06-30 MED ORDER — SODIUM HYALURONATE 10 MG/ML IO SOLN
INTRAOCULAR | Status: DC | PRN
  Administered 2018-06-30: 0.55 mL via INTRAOCULAR

## 2018-06-30 MED ORDER — TETRACAINE HCL 0.5 % OP SOLN
OPHTHALMIC | Status: AC
  Administered 2018-06-30: 1 [drp] via OPHTHALMIC
  Filled 2018-06-30: qty 4

## 2018-06-30 MED ORDER — FENTANYL CITRATE (PF) 100 MCG/2ML IJ SOLN
INTRAMUSCULAR | Status: DC | PRN
  Administered 2018-06-30 (×4): 25 ug via INTRAVENOUS

## 2018-06-30 MED ORDER — FENTANYL CITRATE (PF) 100 MCG/2ML IJ SOLN
INTRAMUSCULAR | Status: AC
  Filled 2018-06-30: qty 2

## 2018-06-30 MED ORDER — MOXIFLOXACIN HCL 0.5 % OP SOLN
OPHTHALMIC | Status: AC
  Filled 2018-06-30: qty 3

## 2018-06-30 MED ORDER — EPINEPHRINE PF 1 MG/ML IJ SOLN
INTRAOCULAR | Status: DC | PRN
  Administered 2018-06-30: 11:00:00 via OPHTHALMIC

## 2018-06-30 MED ORDER — MIDAZOLAM HCL 2 MG/2ML IJ SOLN
INTRAMUSCULAR | Status: AC
  Filled 2018-06-30: qty 2

## 2018-06-30 MED ORDER — LIDOCAINE HCL (PF) 4 % IJ SOLN
INTRAOCULAR | Status: DC | PRN
  Administered 2018-06-30: 4 mL via OPHTHALMIC

## 2018-06-30 SURGICAL SUPPLY — 16 items
DISSECTOR HYDRO NUCLEUS 50X22 (MISCELLANEOUS) ×3 IMPLANT
GLOVE BIO SURGEON STRL SZ8 (GLOVE) ×3 IMPLANT
GLOVE BIOGEL M 6.5 STRL (GLOVE) ×3 IMPLANT
GLOVE SURG LX 7.5 STRW (GLOVE) ×2
GLOVE SURG LX STRL 7.5 STRW (GLOVE) ×1 IMPLANT
GOWN STRL REUS W/ TWL LRG LVL3 (GOWN DISPOSABLE) ×2 IMPLANT
GOWN STRL REUS W/TWL LRG LVL3 (GOWN DISPOSABLE) ×4
LABEL CATARACT MEDS ST (LABEL) ×3 IMPLANT
LENS IOL TECNIS ITEC 20.5 (Intraocular Lens) ×3 IMPLANT
PACK CATARACT (MISCELLANEOUS) ×3 IMPLANT
PACK CATARACT KING (MISCELLANEOUS) ×3 IMPLANT
PACK EYE AFTER SURG (MISCELLANEOUS) ×3 IMPLANT
SOL BSS BAG (MISCELLANEOUS) ×3
SOLUTION BSS BAG (MISCELLANEOUS) ×1 IMPLANT
WATER STERILE IRR 250ML POUR (IV SOLUTION) ×3 IMPLANT
WIPE NON LINTING 3.25X3.25 (MISCELLANEOUS) ×3 IMPLANT

## 2018-06-30 NOTE — Anesthesia Preprocedure Evaluation (Signed)
Anesthesia Evaluation  Patient identified by MRN, date of birth, ID band Patient awake    Reviewed: Allergy & Precautions, NPO status , Patient's Chart, lab work & pertinent test results  History of Anesthesia Complications Negative for: history of anesthetic complications  Airway Mallampati: II  TM Distance: >3 FB Neck ROM: Full    Dental  (+) Poor Dentition, Missing   Pulmonary neg sleep apnea, neg COPD,    breath sounds clear to auscultation- rhonchi (-) wheezing      Cardiovascular hypertension, Pt. on medications + CAD and +CHF  (-) Past MI, (-) Cardiac Stents and (-) CABG  Rhythm:Regular Rate:Normal - Systolic murmurs and - Diastolic murmurs    Neuro/Psych neg Seizures negative neurological ROS  negative psych ROS   GI/Hepatic Neg liver ROS, GERD  ,  Endo/Other  diabetes  Renal/GU ESRF and DialysisRenal disease     Musculoskeletal negative musculoskeletal ROS (+)   Abdominal (+) + obese,   Peds  Hematology negative hematology ROS (+)   Anesthesia Other Findings Past Medical History: No date: CHF (congestive heart failure) (HCC) No date: Coronary artery disease No date: Diabetes mellitus without complication (HCC) No date: GERD (gastroesophageal reflux disease) No date: Hypertension 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 No date: Renal disorder     Comment:  on dialysis for 4 years three times a week last was               Tuesday No date: Shortness of breath dyspnea   Reproductive/Obstetrics                             Anesthesia Physical Anesthesia Plan  ASA: IV  Anesthesia Plan: MAC   Post-op Pain Management:    Induction: Intravenous  PONV Risk Score and Plan: 1 and Midazolam  Airway Management Planned: Natural Airway  Additional Equipment:   Intra-op Plan:   Post-operative Plan:   Informed Consent: I have reviewed the patients History and Physical, chart, labs and  discussed the procedure including the risks, benefits and alternatives for the proposed anesthesia with the patient or authorized representative who has indicated his/her understanding and acceptance.       Plan Discussed with: CRNA and Anesthesiologist  Anesthesia Plan Comments:         Anesthesia Quick Evaluation

## 2018-06-30 NOTE — Anesthesia Post-op Follow-up Note (Signed)
Anesthesia QCDR form completed.        

## 2018-06-30 NOTE — Op Note (Signed)
OPERATIVE NOTE  [name] [name] [id number] 06/30/2018   PREOPERATIVE DIAGNOSIS:  Nuclear sclerotic cataract right eye.  H25.11   POSTOPERATIVE DIAGNOSIS:    Nuclear sclerotic cataract right eye.     PROCEDURE:  Phacoemusification with posterior chamber intraocular lens placement of the right eye   LENS:   Implant Name Type Inv. Item Serial No. Manufacturer Lot No. LRB No. Used  LENS IOL DIOP 20.5 - [id number] Intraocular Lens LENS IOL DIOP 20.5 [id number] AMO  Right 1       PCB00 +20.5   ULTRASOUND TIME: 0 minutes 7 seconds.  CDE 0.16   SURGEON:  Benay Pillow, MD, MPH  ANESTHESIOLOGIST: Anesthesiologist: Emmie Niemann, MD CRNA: Philbert Riser, CRNA   ANESTHESIA:  Topical with tetracaine drops augmented with 1% preservative-free intracameral lidocaine.  ESTIMATED BLOOD LOSS: less than 1 mL.   COMPLICATIONS:  None.   DESCRIPTION OF PROCEDURE:  The patient was identified in the holding room and transported to the operating room and placed in the supine position under the operating microscope.  The right eye was identified as the operative eye and it was prepped and draped in the usual sterile ophthalmic fashion.   A 1.0 millimeter clear-corneal paracentesis was made at the 10:30 position. 0.5 ml of preservative-free 1% lidocaine with epinephrine was injected into the anterior chamber.  The anterior chamber was filled with Healon 5 viscoelastic.  A 2.4 millimeter keratome was used to make a near-clear corneal incision at the 8:00 position.  A curvilinear capsulorrhexis was made with a cystotome and capsulorrhexis forceps.  Balanced salt solution was used to hydrodissect and hydrodelineate the nucleus.   Phacoemulsification was then used in stop and chop fashion to remove the lens nucleus and epinucleus.  The remaining cortex was then removed using the irrigation and aspiration handpiece. Healon was then placed into the capsular bag to distend it for lens placement.  A lens was  then injected into the capsular bag.  The remaining viscoelastic was aspirated.   Wounds were hydrated with balanced salt solution.  The anterior chamber was inflated to a physiologic pressure with balanced salt solution.   Intracameral vigamox 0.1 mL undiluted was injected into the eye and a drop placed onto the ocular surface.  No wound leaks were noted.  The patient was taken to the recovery room in stable condition without complications of anesthesia or surgery  Benay Pillow 06/30/2018, 11:01 AM

## 2018-06-30 NOTE — Discharge Instructions (Signed)

## 2018-06-30 NOTE — H&P (Signed)

## 2018-06-30 NOTE — Anesthesia Postprocedure Evaluation (Signed)
Anesthesia Post Note  Patient: [name] [name]  Procedure(s) Performed: CATARACT EXTRACTION PHACO AND INTRAOCULAR LENS PLACEMENT (IOC)-RIGHT (Right Eye)  Patient location during evaluation: Phase II Anesthesia Type: MAC Level of consciousness: oriented and awake and alert Pain management: satisfactory to patient Vital Signs Assessment: post-procedure vital signs reviewed and stable Respiratory status: spontaneous breathing Cardiovascular status: stable Anesthetic complications: no     Last Vitals:  Vitals:   06/30/18 0758  BP: (!) 148/91  Pulse: 80  Resp: 17  Temp: 36.8 C  SpO2: 99%    Last Pain:  Vitals:   06/30/18 0758  TempSrc: Oral  PainSc: 0-No pain                 Philbert Riser

## 2018-06-30 NOTE — Transfer of Care (Signed)
Immediate Anesthesia Transfer of Care Note  Patient: [name] [name]  Procedure(s) Performed: CATARACT EXTRACTION PHACO AND INTRAOCULAR LENS PLACEMENT (IOC)-RIGHT (Right Eye)  Patient Location: PACU  Anesthesia Type:MAC  Level of Consciousness: awake, alert  and oriented  Airway & Oxygen Therapy: Patient Spontanous Breathing and Patient connected to nasal cannula oxygen  Post-op Assessment: Report given to RN and Post -op Vital signs reviewed and stable  Post vital signs: Reviewed and stable  Last Vitals:  Vitals Value Taken Time  BP    Temp    Pulse    Resp    SpO2      Last Pain:  Vitals:   06/30/18 0758  TempSrc: Oral  PainSc: 0-No pain         Complications: No apparent anesthesia complications

## 2018-07-22 ENCOUNTER — Ambulatory Visit (INDEPENDENT_AMBULATORY_CARE_PROVIDER_SITE_OTHER): Payer: Medicare Other | Admitting: Nurse Practitioner

## 2018-07-22 ENCOUNTER — Encounter (INDEPENDENT_AMBULATORY_CARE_PROVIDER_SITE_OTHER): Payer: Self-pay | Admitting: Nurse Practitioner

## 2018-07-22 ENCOUNTER — Other Ambulatory Visit: Payer: Self-pay

## 2018-07-22 VITALS — BP 100/66 | HR 82 | Resp 10 | Ht 74.0 in | Wt 285.0 lb

## 2018-07-22 DIAGNOSIS — Z992 Dependence on renal dialysis: Secondary | ICD-10-CM | POA: Diagnosis not present

## 2018-07-22 DIAGNOSIS — Z794 Long term (current) use of insulin: Secondary | ICD-10-CM

## 2018-07-22 DIAGNOSIS — N186 End stage renal disease: Secondary | ICD-10-CM

## 2018-07-22 DIAGNOSIS — I120 Hypertensive chronic kidney disease with stage 5 chronic kidney disease or end stage renal disease: Secondary | ICD-10-CM | POA: Diagnosis not present

## 2018-07-22 DIAGNOSIS — I10 Essential (primary) hypertension: Secondary | ICD-10-CM

## 2018-07-22 DIAGNOSIS — E1122 Type 2 diabetes mellitus with diabetic chronic kidney disease: Secondary | ICD-10-CM | POA: Diagnosis not present

## 2018-07-22 DIAGNOSIS — Z79899 Other long term (current) drug therapy: Secondary | ICD-10-CM

## 2018-07-22 NOTE — Progress Notes (Signed)
SUBJECTIVE:  Patient ID: [name] [name], male    DOB: 01-Jun-1975, 43 y.o.   MRN: [id number] Chief Complaint  Patient presents with  . Follow-up    HPI  [name] [name] is a 43 y.o. male that presents today after upper extremity angiogram for steal due to his AV fistula.  No intervention was done at that time, due to the likely culprit not being the size of his arteries.  In his operative notes Dr. Lucky Cowboy noted that if he continued to have symptoms a banding of his AV fistula would be necessary.  Today he notes that he has pain in his hand mostly during dialysis, however he is able to work through the pain utilizing a glove or a blanket covering his hand.  He states that with this intervention it is tolerable however if he does not have it dialysis becomes painful.  He denies any current issues with pain when he is not on dialysis at this time.  He denies any fever, chills, nausea, vomiting or diarrhea.  He denies any chest pain or shortness of breath.  Past Medical History:  Diagnosis Date  . CHF (congestive heart failure) ([place])   . Coronary artery disease   . Diabetes mellitus without complication ([place])   . GERD (gastroesophageal reflux disease)   . Hypertension   . Kyrle's disease   . Renal disorder    on dialysis for 4 years three times a week last was Tuesday  . Shortness of breath dyspnea     Past Surgical History:  Procedure Laterality Date  . A/V FISTULAGRAM Left 10/01/2017   Procedure: A/V FISTULAGRAM;  Surgeon: Algernon Huxley, MD;  Location: [place] [facility];  Service: Cardiovascular;  Laterality: Left;  . CATARACT EXTRACTION W/PHACO Right 06/30/2018   Procedure: CATARACT EXTRACTION PHACO AND INTRAOCULAR LENS PLACEMENT (IOC)-RIGHT;  Surgeon: Eulogio Bear, MD;  Location: [facility] ORS;  Service: Ophthalmology;  Laterality: Right;  [place] 00:07.0 CDE 0.16 FLUID PACK lOT # [id number]  . COLONOSCOPY WITH PROPOFOL N/A 04/23/2017   Procedure: COLONOSCOPY WITH PROPOFOL;  Surgeon:  [name], MD;  Location: [facility] ENDOSCOPY;  Service: Endoscopy;  Laterality: N/A;  . COLONOSCOPY WITH PROPOFOL N/A 07/02/2017   Procedure: COLONOSCOPY WITH PROPOFOL;  Surgeon: [name], MD;  Location: [facility] ENDOSCOPY;  Service: Endoscopy;  Laterality: N/A;  . ESOPHAGOGASTRODUODENOSCOPY (EGD) WITH PROPOFOL N/A 04/23/2017   Procedure: ESOPHAGOGASTRODUODENOSCOPY (EGD) WITH PROPOFOL;  Surgeon: [name], MD;  Location: [facility] ENDOSCOPY;  Service: Endoscopy;  Laterality: N/A;  . EYE SURGERY    . PERIPHERAL VASCULAR CATHETERIZATION N/A 05/07/2015   Procedure: A/V Shuntogram/Fistulagram;  Surgeon: Katha Cabal, MD;  Location: [place] [facility];  Service: Cardiovascular;  Laterality: N/A;  . PERIPHERAL VASCULAR CATHETERIZATION Left 05/07/2015   Procedure: A/V Shunt Intervention;  Surgeon: Katha Cabal, MD;  Location: [place] [facility];  Service: Cardiovascular;  Laterality: Left;  . TEE WITHOUT CARDIOVERSION N/A 07/05/2015   Procedure: TRANSESOPHAGEAL ECHOCARDIOGRAM (TEE);  Surgeon: Dionisio David, MD;  Location: [facility] ORS;  Service: Cardiovascular;  Laterality: N/A;  . UPPER EXTREMITY ANGIOGRAPHY Left 06/24/2018   Procedure: UPPER EXTREMITY ANGIOGRAPHY;  Surgeon: Algernon Huxley, MD;  Location: [place] [facility];  Service: Cardiovascular;  Laterality: Left;    Social History   Socioeconomic History  . Marital status: Single    Spouse name: Not on file  . Number of children: Not on file  . Years of education: Not on file  .  Highest education level: Not on file  Occupational History  . Not on file  Social Needs  . Financial resource strain: Not on file  . Food insecurity:    Worry: Not on file    Inability: Not on file  . Transportation needs:    Medical: Not on file    Non-medical: Not on file  Tobacco Use  . Smoking status: Never Smoker  . Smokeless tobacco: Never Used  Substance and Sexual Activity  . Alcohol use: Yes    Comment: occ. shot  . Drug use:  No  . Sexual activity: Not on file  Lifestyle  . Physical activity:    Days per week: Not on file    Minutes per session: Not on file  . Stress: Not on file  Relationships  . Social connections:    Talks on phone: Not on file    Gets together: Not on file    Attends religious service: Not on file    Active member of club or organization: Not on file    Attends meetings of clubs or organizations: Not on file    Relationship status: Not on file  . Intimate partner violence:    Fear of current or ex partner: Not on file    Emotionally abused: Not on file    Physically abused: Not on file    Forced sexual activity: Not on file  Other Topics Concern  . Not on file  Social History Narrative  . Not on file    History reviewed. No pertinent family history.  Allergies  Allergen Reactions  . Ivp Dye [Iodinated Diagnostic Agents] Diarrhea  . Lisinopril Other (See Comments)    Pt cannot recall reaction     Review of Systems   Review of Systems: Negative Unless Checked Constitutional: [] Weight loss  [] Fever  [] Chills Cardiac: [] Chest pain   []  Atrial Fibrillation  [] Palpitations   [] Shortness of breath when laying flat   [] Shortness of breath with exertion. [] Shortness of breath at rest Vascular:  [] Pain in legs with walking   [] Pain in legs with standing [] Pain in legs when laying flat   [] Claudication    [] Pain in feet when laying flat    [] History of DVT   [] Phlebitis   [] Swelling in legs   [] Varicose veins   [] Non-healing ulcers Pulmonary:   [] Uses home oxygen   [] Productive cough   [] Hemoptysis   [] Wheeze  [] COPD   [] Asthma Neurologic:  [] Dizziness   [] Seizures  [] Blackouts [] History of stroke   [] History of TIA  [] Aphasia   [] Temporary Blindness   [] Weakness or numbness in arm   [] Weakness or numbness in leg Musculoskeletal:   [] Joint swelling   [] Joint pain   [] Low back pain  []  History of Knee Replacement [] Arthritis [] back Surgeries  []  Spinal Stenosis    Hematologic:  [] Easy  bruising  [] Easy bleeding   [] Hypercoagulable state   [x] Anemic Gastrointestinal:  [] Diarrhea   [] Vomiting  [] Gastroesophageal reflux/heartburn   [] Difficulty swallowing. [] Abdominal pain Genitourinary:  [x] Chronic kidney disease   [] Difficult urination  [] Anuric   [] Blood in urine [] Frequent urination  [] Burning with urination   [] Hematuria Skin:  [] Rashes   [] Ulcers [] Wounds Psychological:  [] History of anxiety   []  History of major depression  []  Memory Difficulties      OBJECTIVE:   Physical Exam  BP 100/66 (BP Location: Left Arm, Patient Position: Sitting, Cuff Size: Large)   Pulse 82   Resp 10   Ht 6'  2" (1.88 m)   Wt 285 lb (129.3 kg)   BMI 36.59 kg/m   Gen: WD/WN, NAD Head: [place]/AT, No temporalis wasting.  Ear/Nose/Throat: Hearing grossly intact, nares w/o erythema or drainage Eyes: PER, EOMI, sclera nonicteric.  Neck: Supple, no masses.  No JVD.  Pulmonary:  Good air movement, no use of accessory muscles.  Cardiac: RRR Vascular:  Good thrill and bruit Vessel Right Left  Radial Palpable Palpable   Gastrointestinal: soft, non-distended. No guarding/no peritoneal signs.  Musculoskeletal: M/S 5/5 throughout.  No deformity or atrophy.  Neurologic: Pain and light touch intact in extremities.  Symmetrical.  Speech is fluent. Motor exam as listed above. Psychiatric: Judgment intact, Mood & affect appropriate for pt's clinical situation. Dermatologic: No Venous rashes. No Ulcers Noted.  No changes consistent with cellulitis. Lymph : No Cervical lymphadenopathy, no lichenification or skin changes of chronic lymphedema.       ASSESSMENT AND PLAN:  1. ESRD on dialysis [facility]) Currently the patient states that he is able to tolerate the pain that he has in hand with hemodialysis.  Discussed banding of the fistula with the patient, and after this discussion the patient decided that he would prefer to hold off at this time.  The patient is advised that if his hand pain becomes  unmanageable or if he begins to have ulcerations of his lower extremity, or discoloration of his fingertips he should contact our office and we will follow-up sooner.  Otherwise we will see him in 6 months with an HDA.  2. Type 2 diabetes mellitus with chronic kidney disease on chronic dialysis, with long-term current use of insulin (HCC) Continue hypoglycemic medications as already ordered, these medications have been reviewed and there are no changes at this time.  Hgb A1C to be monitored as already arranged by primary service   3. Essential hypertension Continue antihypertensive medications as already ordered, these medications have been reviewed and there are no changes at this time.    Current Outpatient Medications on File Prior to Visit  Medication Sig Dispense Refill  . aspirin EC 81 MG tablet Take 81 mg by mouth daily at 6 PM. (1700)    . calcium acetate (PHOSLO) 667 MG capsule Take 1,334 mg by mouth See admin instructions. Take 2 capsules (1334 mg) by mouth with each meal & take 1 capsule (667 mg) by mouth with every snack    . carvedilol (COREG) 25 MG tablet Take 25 mg by mouth 2 (two) times daily.     Stasia Cavalier (EUCRISA EX) Apply 1 application topically 3 (three) times daily as needed (for skin irritation.).    Marland Kitchen HUMULIN 70/30 (70-30) 100 UNIT/ML injection Inject 10-30 Units into the skin See admin instructions. 10 units before breakfast and 30 units before supper    . losartan (COZAAR) 50 MG tablet Take 50 mg by mouth daily. (1700)    . Polyethyl Glycol-Propyl Glycol (LUBRICANT EYE DROPS) 0.4-0.3 % SOLN Place 1 drop into both eyes 3 (three) times daily as needed (for dry eyes.).    Marland Kitchen SENSIPAR 60 MG tablet Take 60 mg by mouth daily at 6 PM. 1700  3   No current facility-administered medications on file prior to visit.     There are no Patient Instructions on file for this visit. No follow-ups on file.   Kris Hartmann, NP  This note was completed with Production manager.  Any errors are purely unintentional.

## 2018-11-21 ENCOUNTER — Other Ambulatory Visit: Payer: Self-pay

## 2018-11-21 ENCOUNTER — Emergency Department: Payer: Medicare Other

## 2018-11-21 ENCOUNTER — Emergency Department
Admission: EM | Admit: 2018-11-21 | Discharge: 2018-11-21 | Disposition: A | Discharge status: Home / Self Care | Payer: Medicare Other | Attending: Student in an Organized Health Care Education/Training Program | Admitting: Student in an Organized Health Care Education/Training Program

## 2018-11-21 DIAGNOSIS — S99922A Unspecified injury of left foot, initial encounter: Secondary | ICD-10-CM | POA: Diagnosis present

## 2018-11-21 DIAGNOSIS — W228XXA Striking against or struck by other objects, initial encounter: Secondary | ICD-10-CM | POA: Insufficient documentation

## 2018-11-21 DIAGNOSIS — Y929 Unspecified place or not applicable: Secondary | ICD-10-CM | POA: Diagnosis not present

## 2018-11-21 DIAGNOSIS — Z79899 Other long term (current) drug therapy: Secondary | ICD-10-CM | POA: Diagnosis not present

## 2018-11-21 DIAGNOSIS — S92425B Nondisplaced fracture of distal phalanx of left great toe, initial encounter for open fracture: Secondary | ICD-10-CM | POA: Insufficient documentation

## 2018-11-21 DIAGNOSIS — E1122 Type 2 diabetes mellitus with diabetic chronic kidney disease: Secondary | ICD-10-CM | POA: Diagnosis not present

## 2018-11-21 DIAGNOSIS — I132 Hypertensive heart and chronic kidney disease with heart failure and with stage 5 chronic kidney disease, or end stage renal disease: Secondary | ICD-10-CM | POA: Insufficient documentation

## 2018-11-21 DIAGNOSIS — Z992 Dependence on renal dialysis: Secondary | ICD-10-CM | POA: Diagnosis not present

## 2018-11-21 DIAGNOSIS — Y998 Other external cause status: Secondary | ICD-10-CM | POA: Insufficient documentation

## 2018-11-21 DIAGNOSIS — Y9301 Activity, walking, marching and hiking: Secondary | ICD-10-CM | POA: Diagnosis not present

## 2018-11-21 DIAGNOSIS — N186 End stage renal disease: Secondary | ICD-10-CM | POA: Diagnosis not present

## 2018-11-21 DIAGNOSIS — I509 Heart failure, unspecified: Secondary | ICD-10-CM | POA: Diagnosis not present

## 2018-11-21 MED ORDER — BUPIVACAINE HCL (PF) 0.5 % IJ SOLN
10.0000 mL | Freq: Once | INTRAMUSCULAR | Status: AC
  Administered 2018-11-21: 14:00:00 10 mL
  Filled 2018-11-21: qty 10

## 2018-11-21 MED ORDER — CEPHALEXIN 500 MG PO CAPS: 500.0000 mg | ORAL_CAPSULE | Freq: Three times a day (TID) | ORAL | 0 refills | Status: DC

## 2018-11-21 MED ORDER — HYDROCODONE-ACETAMINOPHEN 5-325 MG PO TABS: 1.0000 | ORAL_TABLET | Freq: Four times a day (QID) | ORAL | 0 refills | Status: DC | PRN

## 2018-11-21 NOTE — ED Provider Notes (Signed)
[facility] Emergency Department Provider Note  ____________________________________________   First MD Initiated Contact with Patient 11/21/18 1115     (approximate)  I have reviewed the triage vital signs and the nursing notes.   HISTORY  Chief Complaint Toe Injury   HPI [name] [name] is a 43 y.o. male presents to the ED with complaint of left great toe pain after he hit chair this morning and had some bleeding.  He states he immediately put a dressing on it because he did not want to see the blood.  Currently he denies any pain.  Tetanus is up-to-date.       Past Medical History:  Diagnosis Date  . CHF (congestive heart failure) ([place])   . Coronary artery disease   . Diabetes mellitus without complication ([place])   . GERD (gastroesophageal reflux disease)   . Hypertension   . Kyrle's disease   . Renal disorder    on dialysis for 4 years three times a week last was Tuesday  . Shortness of breath dyspnea     Patient Active Problem List   Diagnosis Date Noted  . Chronic venous insufficiency 02/17/2017  . ESRD on dialysis ([place]) 07/24/2016  . Diabetes ([place]) 07/24/2016  . Essential hypertension 07/24/2016  . Venous ulcer of left leg ([place]) 07/24/2016    Past Surgical History:  Procedure Laterality Date  . A/V FISTULAGRAM Left 10/01/2017   Procedure: A/V FISTULAGRAM;  Surgeon: Algernon Huxley, MD;  Location: [place] [facility];  Service: Cardiovascular;  Laterality: Left;  . CATARACT EXTRACTION W/PHACO Right 06/30/2018   Procedure: CATARACT EXTRACTION PHACO AND INTRAOCULAR LENS PLACEMENT (IOC)-RIGHT;  Surgeon: Eulogio Bear, MD;  Location: [facility] ORS;  Service: Ophthalmology;  Laterality: Right;  [place] 00:07.0 CDE 0.16 FLUID PACK lOT # [id number]  . COLONOSCOPY WITH PROPOFOL N/A 04/23/2017   Procedure: COLONOSCOPY WITH PROPOFOL;  Surgeon: [name], MD;  Location: [facility] ENDOSCOPY;  Service: Endoscopy;  Laterality: N/A;  . COLONOSCOPY WITH  PROPOFOL N/A 07/02/2017   Procedure: COLONOSCOPY WITH PROPOFOL;  Surgeon: [name], MD;  Location: [facility] ENDOSCOPY;  Service: Endoscopy;  Laterality: N/A;  . ESOPHAGOGASTRODUODENOSCOPY (EGD) WITH PROPOFOL N/A 04/23/2017   Procedure: ESOPHAGOGASTRODUODENOSCOPY (EGD) WITH PROPOFOL;  Surgeon: [name], MD;  Location: [facility] ENDOSCOPY;  Service: Endoscopy;  Laterality: N/A;  . EYE SURGERY    . PERIPHERAL VASCULAR CATHETERIZATION N/A 05/07/2015   Procedure: A/V Shuntogram/Fistulagram;  Surgeon: Katha Cabal, MD;  Location: [place] [facility];  Service: Cardiovascular;  Laterality: N/A;  . PERIPHERAL VASCULAR CATHETERIZATION Left 05/07/2015   Procedure: A/V Shunt Intervention;  Surgeon: Katha Cabal, MD;  Location: [place] [facility];  Service: Cardiovascular;  Laterality: Left;  . TEE WITHOUT CARDIOVERSION N/A 07/05/2015   Procedure: TRANSESOPHAGEAL ECHOCARDIOGRAM (TEE);  Surgeon: Dionisio David, MD;  Location: [facility] ORS;  Service: Cardiovascular;  Laterality: N/A;  . UPPER EXTREMITY ANGIOGRAPHY Left 06/24/2018   Procedure: UPPER EXTREMITY ANGIOGRAPHY;  Surgeon: Algernon Huxley, MD;  Location: [place] [facility];  Service: Cardiovascular;  Laterality: Left;    Prior to Admission medications   Medication Sig Start Date End Date Taking? Authorizing Provider  aspirin EC 81 MG tablet Take 81 mg by mouth daily at 6 PM. (1700) 01/14/16   [provider]  calcium acetate (PHOSLO) 667 MG capsule Take 1,334 mg by mouth See admin instructions. Take 2 capsules (1334 mg) by mouth with each meal & take 1 capsule (667 mg) by mouth  with every snack 10/02/16   [provider]  carvedilol (COREG) 25 MG tablet Take 25 mg by mouth 2 (two) times daily.  03/20/15   [provider]  cephALEXin (KEFLEX) 500 MG capsule Take 1 capsule (500 mg total) by mouth 3 (three) times daily. 11/21/18   Johnn Hai, PA-C  Crisaborole (EUCRISA EX) Apply 1 application topically 3 (three)  times daily as needed (for skin irritation.).    [provider]  HUMULIN 70/30 (70-30) 100 UNIT/ML injection Inject 10-30 Units into the skin See admin instructions. 10 units before breakfast and 30 units before supper 04/19/15   [provider]  HYDROcodone-acetaminophen (NORCO/VICODIN) 5-325 MG tablet Take 1 tablet by mouth every 6 (six) hours as needed for moderate pain. 11/21/18   Johnn Hai, PA-C  losartan (COZAAR) 50 MG tablet Take 50 mg by mouth daily. (1700) 05/22/18   [provider]  Polyethyl Glycol-Propyl Glycol (LUBRICANT EYE DROPS) 0.4-0.3 % SOLN Place 1 drop into both eyes 3 (three) times daily as needed (for dry eyes.).    [provider]  SENSIPAR 60 MG tablet Take 60 mg by mouth daily at 6 PM. 1700 04/28/15   [provider]    Allergies Ivp dye [iodinated diagnostic agents] and Lisinopril  No family history on file.  Social History Social History   Tobacco Use  . Smoking status: Never Smoker  . Smokeless tobacco: Never Used  Substance Use Topics  . Alcohol use: Yes    Comment: occ. shot  . Drug use: No    Review of Systems Constitutional: No fever/chills Cardiovascular: Denies chest pain. Respiratory: Denies shortness of breath. Genitourinary: Negative for dysuria. Musculoskeletal: Positive for left great toe pain. Skin: Positive for laceration. Neurological: Negative for headaches, focal weakness or numbness. ____________________________________________   PHYSICAL EXAM:  VITAL SIGNS: ED Triage Vitals [11/21/18 1102]  Enc Vitals Group     BP 100/69     Pulse Rate 85     Resp 17     Temp 98.5 F (36.9 C)     Temp Source Oral     SpO2 97 %     Weight 227 lb (103 kg)     Height 6\' 1"  (1.854 m)     Head Circumference      Peak Flow      Pain Score 0     Pain Loc      Pain Edu?      Excl. in [place]?    Constitutional: Alert and oriented. Well appearing and in no acute distress. Eyes: Conjunctivae  are normal.  Head: Atraumatic. Neck: No stridor.   Cardiovascular: Normal rate, regular rhythm. Grossly normal heart sounds.  Good peripheral circulation. Respiratory: Normal respiratory effort.  No retractions. Lungs CTAB. Musculoskeletal: Left great toe with 2 individual linear lacerations near the base of the nail.  No gross deformity is noted.  Patient is able to flex and extend without difficulty.  Nail appears to be intact. Neurologic:  Normal speech and language. No gross focal neurologic deficits are appreciated. No gait instability. Skin:  Skin is warm, dry.  Laceration as noted above. Psychiatric: Mood and affect are normal. Speech and behavior are normal.  ____________________________________________   LABS (all labs ordered are listed, but only abnormal results are displayed)  Labs Reviewed - No data to display RADIOLOGY  ED MD interpretation:   Positive for left great toe fracture distal phalanx.  Official radiology report(s): Dg Toe Great Left  Result  Date: 11/21/2018 CLINICAL DATA:  Stubbed left great toe today, left distal foot is wrapped and bloody, pt asked that wrap not be removed for imaging. Diabetic EXAM: LEFT GREAT TOE COMPARISON:  None. FINDINGS: There is soft tissue swelling of the great toe. Oblique fracture identified by through the distal phalanx of the great toe, associated minimal displacement. IMPRESSION: Fracture of the distal phalanx of the great toe. Electronically Signed   By: Nolon Nations M.D.   On: 11/21/2018 11:47    ____________________________________________   PROCEDURES  Procedure(s) performed (including Critical Care):  Marland KitchenMarland KitchenLaceration Repair  Date/Time: 11/21/2018 12:53 PM Performed by: Johnn Hai, PA-C Authorized by: Johnn Hai, PA-C   Consent:    Consent obtained:  Verbal   Consent given by:  Patient   Risks discussed:  Pain and poor wound healing Anesthesia (see MAR for exact dosages):    Anesthesia method:   Nerve block   Block needle gauge:  25 G   Block anesthetic:  Bupivacaine 0.5% w/o epi   Block injection procedure:  Introduced needle, incremental injection, anatomic landmarks identified and negative aspiration for blood   Block outcome:  Anesthesia achieved Laceration details:    Location:  Toe   Toe location:  L big toe   Length (cm):  2 Repair type:    Repair type:  Simple Pre-procedure details:    Preparation:  Patient was prepped and draped in usual sterile fashion Exploration:    Hemostasis achieved with:  Direct pressure   Contaminated: no   Treatment:    Area cleansed with:  Betadine and saline   Amount of cleaning:  Standard   Irrigation solution:  Sterile saline   Irrigation volume:  30   Irrigation method:  Syringe   Visualized foreign bodies/material removed: no   Skin repair:    Repair method:  Sutures   Suture size:  4-0   Suture material:  Prolene   Suture technique:  Simple interrupted   Number of sutures:  3 Approximation:    Approximation:  Close Post-procedure details:    Dressing:  Antibiotic ointment   Patient tolerance of procedure:  Tolerated well, no immediate complications  .Marland KitchenLaceration Repair  Date/Time: 11/21/2018 1:49 PM Performed by: Johnn Hai, PA-C Authorized by: Johnn Hai, PA-C   Consent:    Consent obtained:  Verbal   Consent given by:  Patient   Risks discussed:  Poor wound healing and infection Anesthesia (see MAR for exact dosages):    Anesthesia method:  Nerve block   Block needle gauge:  25 G   Block anesthetic:  Bupivacaine 0.5% w/o epi   Block injection procedure:  Introduced needle, anatomic landmarks palpated and anatomic landmarks identified   Block outcome:  Anesthesia achieved Laceration details:    Location:  Toe   Toe location:  L big toe   Length (cm):  1 Repair type:    Repair type:  Simple Pre-procedure details:    Preparation:  Patient was prepped and draped in usual sterile fashion  Exploration:    Hemostasis achieved with:  Direct pressure   Contaminated: no   Treatment:    Area cleansed with:  Betadine and saline   Amount of cleaning:  Standard   Irrigation solution:  Sterile saline   Irrigation method:  Syringe   Visualized foreign bodies/material removed: no   Skin repair:    Repair method:  Sutures   Suture size:  4-0   Suture material:  Prolene   Suture  technique:  Simple interrupted   Number of sutures:  2 Approximation:    Approximation:  Close Post-procedure details:    Dressing:  Non-adherent dressing   Patient tolerance of procedure:  Tolerated well, no immediate complications     ____________________________________________   INITIAL IMPRESSION / ASSESSMENT AND PLAN / ED COURSE  As part of my medical decision making, I reviewed the following data within the electronic MEDICAL RECORD NUMBER Notes from prior ED visits and [place] Controlled Substance Database  43 year old male presents to the ED with complaint of left great toe pain after he hit it on a piece of furniture this morning.  Patient wrapped it immediately as he saw some blood and is not sure what type of damage was done.  X-ray confirms that he has a nondisplaced fracture of the distal phalanx left great toe.  There is also 2 very superficial lacerations laterally at the base of the nail.  Patient tolerated suturing without any difficulties.  He was placed on Keflex 500 mg 3 times daily for 10 days and Norco if needed for pain.  He is to follow-up with Dr. Cleda Mccreedy who is on-call for podiatry.  He had a dressing and also taped to his second toe.  He was placed in a postop shoe and given instructions to elevate as needed for pain or swelling. ____________________________________________   FINAL CLINICAL IMPRESSION(S) / ED DIAGNOSES  Final diagnoses:  Open nondisplaced fracture of distal phalanx of left great toe, initial encounter     ED Discharge Orders         Ordered    cephALEXin (KEFLEX)  500 MG capsule  3 times daily     11/21/18 1318    HYDROcodone-acetaminophen (NORCO/VICODIN) 5-325 MG tablet  Every 6 hours PRN     11/21/18 1318           Note:  This document was prepared using Dragon voice recognition software and may include unintentional dictation errors.    Johnn Hai, PA-C 11/21/18 1353    Merlyn Lot, MD 11/21/18 1459

## 2018-11-21 NOTE — Discharge Instructions (Addendum)
Call make an appointment with the podiatrist.  Elevate your foot to reduce swelling. Leave this dressing on for 2 days and then began changing the dressing every day after cleaning the area with mild soap and water.  Wear postop shoe for protection of your great toe.  Toe fractures usually take between 4 and 6 weeks to heal.  Begin taking your medication as prescribed which is been sent to your pharmacy. Suture removal in approximately 10 days.

## 2018-11-21 NOTE — ED Notes (Signed)
NAD noted at time of D/C. Pt denies questions or concerns. Pt ambulatory to the lobby at this time.  

## 2018-11-21 NOTE — ED Triage Notes (Signed)
Pt states he hit his left great toe on a chair this morning and having pain and some mild bleeding

## 2018-12-01 ENCOUNTER — Other Ambulatory Visit: Payer: Self-pay

## 2018-12-01 DIAGNOSIS — Z20822 Contact with and (suspected) exposure to covid-19: Secondary | ICD-10-CM

## 2018-12-02 LAB — NOVEL CORONAVIRUS, NAA: SARS-CoV-2, NAA: DETECTED — AB

## 2018-12-15 ENCOUNTER — Encounter: Payer: Self-pay | Admitting: *Deleted

## 2018-12-21 ENCOUNTER — Other Ambulatory Visit: Admission: RE | Admit: 2018-12-21 | Payer: Medicare Other | Source: Ambulatory Visit

## 2018-12-24 ENCOUNTER — Ambulatory Visit: Payer: Medicare Other | Admitting: Anesthesiology

## 2018-12-24 ENCOUNTER — Encounter: Payer: Self-pay | Admitting: Ophthalmology

## 2018-12-24 ENCOUNTER — Encounter
Admission: RE | Disposition: A | Discharge status: Home / Self Care | Payer: Self-pay | Source: Home / Self Care | Attending: Ophthalmology

## 2018-12-24 ENCOUNTER — Ambulatory Visit
Admission: RE | Admit: 2018-12-24 | Discharge: 2018-12-24 | Disposition: A | Discharge status: Home / Self Care | Payer: Medicare Other | Attending: Ophthalmology | Admitting: Ophthalmology

## 2018-12-24 DIAGNOSIS — Z992 Dependence on renal dialysis: Secondary | ICD-10-CM | POA: Insufficient documentation

## 2018-12-24 DIAGNOSIS — I509 Heart failure, unspecified: Secondary | ICD-10-CM | POA: Insufficient documentation

## 2018-12-24 DIAGNOSIS — I132 Hypertensive heart and chronic kidney disease with heart failure and with stage 5 chronic kidney disease, or end stage renal disease: Secondary | ICD-10-CM | POA: Diagnosis not present

## 2018-12-24 DIAGNOSIS — E1136 Type 2 diabetes mellitus with diabetic cataract: Secondary | ICD-10-CM | POA: Insufficient documentation

## 2018-12-24 DIAGNOSIS — H2512 Age-related nuclear cataract, left eye: Secondary | ICD-10-CM | POA: Diagnosis not present

## 2018-12-24 DIAGNOSIS — Z79899 Other long term (current) drug therapy: Secondary | ICD-10-CM | POA: Insufficient documentation

## 2018-12-24 DIAGNOSIS — E1122 Type 2 diabetes mellitus with diabetic chronic kidney disease: Secondary | ICD-10-CM | POA: Insufficient documentation

## 2018-12-24 DIAGNOSIS — Z7982 Long term (current) use of aspirin: Secondary | ICD-10-CM | POA: Diagnosis not present

## 2018-12-24 DIAGNOSIS — N186 End stage renal disease: Secondary | ICD-10-CM | POA: Diagnosis not present

## 2018-12-24 DIAGNOSIS — Z794 Long term (current) use of insulin: Secondary | ICD-10-CM | POA: Diagnosis not present

## 2018-12-24 DIAGNOSIS — K219 Gastro-esophageal reflux disease without esophagitis: Secondary | ICD-10-CM | POA: Insufficient documentation

## 2018-12-24 DIAGNOSIS — Z8619 Personal history of other infectious and parasitic diseases: Secondary | ICD-10-CM | POA: Diagnosis not present

## 2018-12-24 HISTORY — PX: CATARACT EXTRACTION W/PHACO: SHX586

## 2018-12-24 HISTORY — DX: Dependence on renal dialysis: Z99.2

## 2018-12-24 LAB — POCT I-STAT 4, (NA,K, GLUC, HGB,HCT)
Glucose, Bld: 303 mg/dL — ABNORMAL HIGH (ref 70–99)
HCT: 43 % (ref 39.0–52.0)
Hemoglobin: 14.6 g/dL (ref 13.0–17.0)
Potassium: 4 mmol/L (ref 3.5–5.1)
Sodium: 137 mmol/L (ref 135–145)

## 2018-12-24 LAB — GLUCOSE, CAPILLARY
Glucose-Capillary: 327 mg/dL — ABNORMAL HIGH (ref 70–99)
Glucose-Capillary: 297 mg/dL — ABNORMAL HIGH (ref 70–99)
Glucose-Capillary: 217 mg/dL — ABNORMAL HIGH (ref 70–99)

## 2018-12-24 SURGERY — PHACOEMULSIFICATION, CATARACT, WITH IOL INSERTION
Anesthesia: Monitor Anesthesia Care | Site: Eye | Laterality: Left

## 2018-12-24 MED ORDER — ARMC OPHTHALMIC DILATING DROPS
OPHTHALMIC | Status: AC
  Administered 2018-12-24: 1 via OPHTHALMIC
  Filled 2018-12-24: qty 0.5

## 2018-12-24 MED ORDER — FENTANYL CITRATE (PF) 100 MCG/2ML IJ SOLN
INTRAMUSCULAR | Status: DC | PRN
  Administered 2018-12-24 (×2): 50 ug via INTRAVENOUS

## 2018-12-24 MED ORDER — MOXIFLOXACIN HCL 0.5 % OP SOLN
OPHTHALMIC | Status: DC | PRN
  Administered 2018-12-24: 0.2 mL via OPHTHALMIC

## 2018-12-24 MED ORDER — INSULIN ASPART 100 UNIT/ML ~~LOC~~ SOLN
5.0000 [IU] | Freq: Once | SUBCUTANEOUS | Status: AC
  Administered 2018-12-24: 08:00:00 5 [IU] via SUBCUTANEOUS

## 2018-12-24 MED ORDER — SODIUM HYALURONATE 10 MG/ML IO SOLN
INTRAOCULAR | Status: DC | PRN
  Administered 2018-12-24: 0.55 mL via INTRAOCULAR

## 2018-12-24 MED ORDER — ARMC OPHTHALMIC DILATING DROPS
1.0000 "application " | OPHTHALMIC | Status: AC
  Administered 2018-12-24 (×3): 1 via OPHTHALMIC

## 2018-12-24 MED ORDER — LIDOCAINE HCL (PF) 4 % IJ SOLN
INTRAOCULAR | Status: DC | PRN
  Administered 2018-12-24: 10:00:00 4 mL via OPHTHALMIC

## 2018-12-24 MED ORDER — MIDAZOLAM HCL 2 MG/2ML IJ SOLN
INTRAMUSCULAR | Status: AC
  Filled 2018-12-24: qty 2

## 2018-12-24 MED ORDER — MOXIFLOXACIN HCL 0.5 % OP SOLN
OPHTHALMIC | Status: AC
  Filled 2018-12-24: qty 3

## 2018-12-24 MED ORDER — TETRACAINE HCL 0.5 % OP SOLN
OPHTHALMIC | Status: AC
  Filled 2018-12-24: qty 4

## 2018-12-24 MED ORDER — FENTANYL CITRATE (PF) 100 MCG/2ML IJ SOLN
INTRAMUSCULAR | Status: AC
  Filled 2018-12-24: qty 2

## 2018-12-24 MED ORDER — SODIUM HYALURONATE 23 MG/ML IO SOLN
INTRAOCULAR | Status: DC | PRN
  Administered 2018-12-24: 0.6 mL via INTRAOCULAR

## 2018-12-24 MED ORDER — MIDAZOLAM HCL 2 MG/2ML IJ SOLN
INTRAMUSCULAR | Status: DC | PRN
  Administered 2018-12-24: 1 mg via INTRAVENOUS
  Administered 2018-12-24: 2 mg via INTRAVENOUS
  Administered 2018-12-24: 1 mg via INTRAVENOUS

## 2018-12-24 MED ORDER — INSULIN ASPART 100 UNIT/ML ~~LOC~~ SOLN
SUBCUTANEOUS | Status: AC
  Administered 2018-12-24: 5 [IU] via SUBCUTANEOUS
  Filled 2018-12-24: qty 1

## 2018-12-24 MED ORDER — SODIUM CHLORIDE 0.9 % IV SOLN
INTRAVENOUS | Status: DC
  Administered 2018-12-24: 09:00:00 via INTRAVENOUS

## 2018-12-24 MED ORDER — EPINEPHRINE PF 1 MG/ML IJ SOLN
INTRAOCULAR | Status: DC | PRN
  Administered 2018-12-24: 10:00:00 1 mL via OPHTHALMIC

## 2018-12-24 MED ORDER — MOXIFLOXACIN HCL 0.5 % OP SOLN: 1.0000 [drp] | Freq: Once | OPHTHALMIC | Status: DC

## 2018-12-24 MED ORDER — CARBACHOL 0.01 % IO SOLN
INTRAOCULAR | Status: DC | PRN
  Administered 2018-12-24: 0.5 mL via INTRAOCULAR

## 2018-12-24 MED ORDER — TETRACAINE HCL 0.5 % OP SOLN
1.0000 [drp] | Freq: Two times a day (BID) | OPHTHALMIC | Status: DC
  Administered 2018-12-24: 1 [drp] via OPHTHALMIC

## 2018-12-24 MED ORDER — POVIDONE-IODINE 5 % OP SOLN
OPHTHALMIC | Status: DC | PRN
  Administered 2018-12-24: 1 via OPHTHALMIC

## 2018-12-24 SURGICAL SUPPLY — 16 items

## 2018-12-24 NOTE — Anesthesia Post-op Follow-up Note (Signed)
Anesthesia QCDR form completed.        

## 2018-12-24 NOTE — Op Note (Signed)
OPERATIVE NOTE  [name] [name] [id number] 12/24/2018   PREOPERATIVE DIAGNOSIS:  Nuclear sclerotic cataract left eye.  H25.12   POSTOPERATIVE DIAGNOSIS:    Nuclear sclerotic cataract left eye.     PROCEDURE:  Phacoemusification with posterior chamber intraocular lens placement of the left eye   LENS:   Implant Name Type Inv. Item Serial No. Manufacturer Lot No. LRB No. Used Action  LENS IOL DIOP 20.5 - [id number] Intraocular Lens LENS IOL DIOP 20.5 [id number] AMO  Left 1 Implanted       PCB00 + 20.5   ULTRASOUND TIME: 0 minutes 30 seconds.  CDE 2.01   SURGEON:  Benay Pillow, MD, MPH   ANESTHESIA:  Topical with tetracaine drops augmented with 1% preservative-free intracameral lidocaine.  ESTIMATED BLOOD LOSS: <1 mL   COMPLICATIONS:  None.   DESCRIPTION OF PROCEDURE:  The patient was identified in the holding room and transported to the operating room and placed in the supine position under the operating microscope.  The left eye was identified as the operative eye and it was prepped and draped in the usual sterile ophthalmic fashion.   A 1.0 millimeter clear-corneal paracentesis was made at the 5:00 position. 0.5 ml of preservative-free 1% lidocaine with epinephrine was injected into the anterior chamber.  The anterior chamber was filled with Healon 5 viscoelastic.  A 2.4 millimeter keratome was used to make a near-clear corneal incision at the 2:00 position.  A curvilinear capsulorrhexis was made with a cystotome and capsulorrhexis forceps.  Balanced salt solution was used to hydrodissect and hydrodelineate the nucleus.   Phacoemulsification was then used in stop and chop fashion to remove the lens nucleus and epinucleus.  The remaining cortex was then removed using the irrigation and aspiration handpiece. Healon was then placed into the capsular bag to distend it for lens placement.  A lens was then injected into the capsular bag.  The remaining viscoelastic was aspirated.    Wounds were hydrated with balanced salt solution.  The anterior chamber was inflated to a physiologic pressure with balanced salt solution.  Intracameral vigamox 0.1 mL undiltued was injected into the eye and a drop placed onto the ocular surface.  No wound leaks were noted.  The patient was taken to the recovery room in stable condition without complications of anesthesia or surgery  Benay Pillow 12/24/2018, 10:10 AM

## 2018-12-24 NOTE — Anesthesia Preprocedure Evaluation (Addendum)
Anesthesia Evaluation  Patient identified by MRN, date of birth, ID band Patient awake    Reviewed: Allergy & Precautions, H&P , NPO status , Patient's Chart, lab work & pertinent test results  History of Anesthesia Complications Negative for: history of anesthetic complications  Airway Mallampati: III  TM Distance: >3 FB     Dental  (+) Missing, Chipped, Poor Dentition   Pulmonary shortness of breath, Recent URI  (Covid positive 12/01/18, cough resolved),           Cardiovascular hypertension, + CAD and +CHF       Neuro/Psych negative neurological ROS  negative psych ROS   GI/Hepatic Neg liver ROS, GERD  Controlled,  Endo/Other  diabetes, Insulin Dependent  Renal/GU ESRF and DialysisRenal disease     Musculoskeletal   Abdominal   Peds  Hematology negative hematology ROS (+)   Anesthesia Other Findings Past Medical History: No date: CHF (congestive heart failure) (HCC) No date: Coronary artery disease No date: Diabetes mellitus without complication ([place]) No date: Dialysis patient [facility])     Comment:  M-W-F No date: GERD (gastroesophageal reflux disease) No date: Hypertension 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 No date: Renal disorder     Comment:  on dialysis for 4 years three times a week last was               Tuesday No date: Shortness of breath dyspnea  Past Surgical History: 10/01/2017: A/V FISTULAGRAM; Left     Comment:  Procedure: A/V FISTULAGRAM;  Surgeon: Algernon Huxley, MD;               Location: [place] [facility];  Service: Cardiovascular;              Laterality: Left; 06/30/2018: CATARACT EXTRACTION W/PHACO; Right     Comment:  Procedure: CATARACT EXTRACTION PHACO AND INTRAOCULAR               LENS PLACEMENT (IOC)-RIGHT;  Surgeon: Eulogio Bear,              MD;  Location: [facility] ORS;  Service: Ophthalmology;                Laterality: Right;  [place] 00:07.0 CDE 0.16 FLUID PACK lOT               #  [id number] 04/23/2017: COLONOSCOPY WITH PROPOFOL; N/A     Comment:  Procedure: COLONOSCOPY WITH PROPOFOL;  Surgeon:               [name], MD;  Location: [facility] ENDOSCOPY;                Service: Endoscopy;  Laterality: N/A; 07/02/2017: COLONOSCOPY WITH PROPOFOL; N/A     Comment:  Procedure: COLONOSCOPY WITH PROPOFOL;  Surgeon:               [name], MD;  Location: [facility] ENDOSCOPY;                Service: Endoscopy;  Laterality: N/A; 04/23/2017: ESOPHAGOGASTRODUODENOSCOPY (EGD) WITH PROPOFOL; N/A     Comment:  Procedure: ESOPHAGOGASTRODUODENOSCOPY (EGD) WITH               PROPOFOL;  Surgeon: [name], MD;  Location:               [facility] ENDOSCOPY;  Service: Endoscopy;  Laterality: N/A; No date: EYE SURGERY     Comment:  RETINA 05/07/2015: PERIPHERAL VASCULAR CATHETERIZATION; N/A  Comment:  Procedure: A/V Shuntogram/Fistulagram;  Surgeon: Katha Cabal, MD;  Location: [place] [facility];  Service:              Cardiovascular;  Laterality: N/A; 05/07/2015: PERIPHERAL VASCULAR CATHETERIZATION; Left     Comment:  Procedure: A/V Shunt Intervention;  Surgeon: Katha Cabal, MD;  Location: [place] [facility];  Service:               Cardiovascular;  Laterality: Left; 07/05/2015: TEE WITHOUT CARDIOVERSION; N/A     Comment:  Procedure: TRANSESOPHAGEAL ECHOCARDIOGRAM (TEE);                Surgeon: Dionisio David, MD;  Location: [facility] ORS;                Service: Cardiovascular;  Laterality: N/A; 06/24/2018: UPPER EXTREMITY ANGIOGRAPHY; Left     Comment:  Procedure: UPPER EXTREMITY ANGIOGRAPHY;  Surgeon: Algernon Huxley, MD;  Location: [place] [facility];  Service:               Cardiovascular;  Laterality: Left;  BMI    Body Mass Index: 37.73 kg/m      Reproductive/Obstetrics negative OB ROS                           Anesthesia Physical Anesthesia Plan  ASA: III  Anesthesia Plan: MAC    Post-op Pain Management:    Induction:   PONV Risk Score and Plan:   Airway Management Planned: Nasal Cannula and Natural Airway  Additional Equipment:   Intra-op Plan:   Post-operative Plan:   Informed Consent: I have reviewed the patients History and Physical, chart, labs and discussed the procedure including the risks, benefits and alternatives for the proposed anesthesia with the patient or authorized representative who has indicated his/her understanding and acceptance.       Plan Discussed with: Anesthesiologist and CRNA  Anesthesia Plan Comments:         Anesthesia Quick Evaluation

## 2018-12-24 NOTE — Anesthesia Postprocedure Evaluation (Signed)
Anesthesia Post Note  Patient: [name] [name]  Procedure(s) Performed: CATARACT EXTRACTION PHACO AND INTRAOCULAR LENS PLACEMENT (IOC) (Left Eye)  Anesthesia Type: MAC Level of consciousness: awake and awake and alert Pain management: pain level controlled Vital Signs Assessment: post-procedure vital signs reviewed and stable Respiratory status: spontaneous breathing Cardiovascular status: blood pressure returned to baseline and stable Postop Assessment: no headache Anesthetic complications: no     Last Vitals:  Vitals:   12/24/18 0736 12/24/18 1013  BP: 122/86 108/64  Pulse: 89 83  Resp: 18 16  Temp: 36.6 C (!) 36.3 C  SpO2: 100% 98%    Last Pain:  Vitals:   12/24/18 1013  TempSrc: Temporal  PainSc: 0-No pain                 Buckner [place]

## 2018-12-24 NOTE — Transfer of Care (Signed)
Immediate Anesthesia Transfer of Care Note  Patient: [name] [name]  Procedure(s) Performed: CATARACT EXTRACTION PHACO AND INTRAOCULAR LENS PLACEMENT (IOC) (Left Eye)  Patient Location: PACU  Anesthesia Type:MAC  Level of Consciousness: awake, alert  and oriented  Airway & Oxygen Therapy: Patient Spontanous Breathing and Patient connected to nasal cannula oxygen  Post-op Assessment: Report given to RN and Post -op Vital signs reviewed and stable  Post vital signs: Reviewed and stable  Last Vitals:  Vitals Value Taken Time  BP 108/64 12/24/18 1013  Temp 36.3 C 12/24/18 1013  Pulse 83 12/24/18 1013  Resp 16 12/24/18 1013  SpO2 98 % 12/24/18 1013    Last Pain:  Vitals:   12/24/18 1013  TempSrc: Temporal  PainSc: 0-No pain         Complications: No apparent anesthesia complications

## 2018-12-24 NOTE — Discharge Instructions (Addendum)
Eye Surgery Discharge Instructions  Expect mild scratchy sensation or mild soreness. DO NOT RUB YOUR EYE!  The day of surgery:  Minimal physical activity, but bed rest is not required  No reading, computer work, or close hand work  No bending, lifting, or straining.  May watch TV  For 24 hours:  No driving, legal decisions, or alcoholic beverages  Safety precautions  Eat anything you prefer: It is better to start with liquids, then soup then solid foods.   Solar shield eyeglasses should be worn for comfort in the sunlight/patch while sleeping  Resume all regular medications including aspirin or Coumadin if these were discontinued prior to surgery. You may shower, bathe, shave, or wash your hair. Tylenol may be taken for mild discomfort. FOLLOW EYE DROP INSTRUCTION SHEET AS REVIEWED.  Call your doctor if you experience significant pain, nausea, or vomiting, fever > 101 or other signs of infection. [phone] or [phone] Specific instructions:  Follow-up Information    Eulogio Bear, MD Follow up.   Specialty: Ophthalmology Why: 12-24-18 @ 4:00 pm  Contact information: [address] [place] [address] [phone]

## 2018-12-24 NOTE — H&P (Signed)

## 2018-12-25 ENCOUNTER — Encounter: Payer: Self-pay | Admitting: Ophthalmology

## 2019-01-25 ENCOUNTER — Other Ambulatory Visit: Payer: Self-pay

## 2019-01-25 ENCOUNTER — Encounter (INDEPENDENT_AMBULATORY_CARE_PROVIDER_SITE_OTHER): Payer: Self-pay | Admitting: Vascular Surgery

## 2019-01-25 ENCOUNTER — Ambulatory Visit (INDEPENDENT_AMBULATORY_CARE_PROVIDER_SITE_OTHER): Payer: Medicare Other

## 2019-01-25 ENCOUNTER — Ambulatory Visit (INDEPENDENT_AMBULATORY_CARE_PROVIDER_SITE_OTHER): Payer: Medicare Other | Admitting: Vascular Surgery

## 2019-01-25 VITALS — BP 99/63 | HR 85 | Resp 16 | Wt 277.0 lb

## 2019-01-25 DIAGNOSIS — N186 End stage renal disease: Secondary | ICD-10-CM

## 2019-01-25 DIAGNOSIS — Z794 Long term (current) use of insulin: Secondary | ICD-10-CM

## 2019-01-25 DIAGNOSIS — E1122 Type 2 diabetes mellitus with diabetic chronic kidney disease: Secondary | ICD-10-CM | POA: Diagnosis not present

## 2019-01-25 DIAGNOSIS — I10 Essential (primary) hypertension: Secondary | ICD-10-CM | POA: Diagnosis not present

## 2019-01-25 DIAGNOSIS — Z992 Dependence on renal dialysis: Secondary | ICD-10-CM

## 2019-01-25 NOTE — Assessment & Plan Note (Signed)
Likely an underlying cause of ESRD and blood pressure control important in reducing the progression of atherosclerotic disease. On appropriate oral medications.

## 2019-01-25 NOTE — Progress Notes (Signed)
MRN : [id number]  [name] [name] is a 43 y.o. (1976-02-08) male who presents with chief complaint of  Chief Complaint  Patient presents with  . Follow-up    ultrasound follow up  .  History of Present Illness: Patient returns today in follow up of his dialysis access.  His steal symptoms of the left hand are stable to slightly improved.  He does still have numbness and some pain with dialysis but outside of being on dialysis he has occasional numbness and that is all.  His fistula is working well without any major issues or problems.  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  Duplex today shows some tortuosity and aneurysmal degeneration of the access sites without any significant stenosis and a patent left brachiocephalic AV fistula is present  Current Outpatient Medications  Medication Sig Dispense Refill  . aspirin EC 81 MG tablet Take 81 mg by mouth daily at 6 PM. (1700)    . carvedilol (COREG) 12.5 MG tablet Take 6.25 mg by mouth daily with supper.     Marland Kitchen HUMULIN 70/30 (70-30) 100 UNIT/ML injection Inject 10-30 Units into the skin See admin instructions. 10 units before breakfast and 30 units before supper ON DIALYSIS DAYS (M-W-F), NO AM INSULIN    . losartan (COZAAR) 50 MG tablet Take 25 mg by mouth daily. (1700)    . SENSIPAR 60 MG tablet Take 60 mg by mouth daily at 6 PM. 1700  3  . HYDROcodone-acetaminophen (NORCO/VICODIN) 5-325 MG tablet Take 1 tablet by mouth every 6 (six) hours as needed for moderate pain. (Patient not taking: Reported on 01/25/2019) 15 tablet 0  . lanthanum (FOSRENOL) 1000 MG chewable tablet Chew 2,000 mg by mouth 3 (three) times daily with meals.     No current facility-administered medications for this visit.     Past Medical History:  Diagnosis Date  . CHF (congestive heart failure) ([place])   . Coronary artery disease   . Diabetes mellitus without complication ([place])   . Dialysis patient [facility])    M-W-F  . GERD (gastroesophageal reflux disease)   . Hypertension    . Kyrle's disease   . Renal disorder    on dialysis for 4 years three times a week last was Tuesday  . Shortness of breath dyspnea     Past Surgical History:  Procedure Laterality Date  . A/V FISTULAGRAM Left 10/01/2017   Procedure: A/V FISTULAGRAM;  Surgeon: Algernon Huxley, MD;  Location: [place] [facility];  Service: Cardiovascular;  Laterality: Left;  . CATARACT EXTRACTION W/PHACO Right 06/30/2018   Procedure: CATARACT EXTRACTION PHACO AND INTRAOCULAR LENS PLACEMENT (IOC)-RIGHT;  Surgeon: Eulogio Bear, MD;  Location: [facility] ORS;  Service: Ophthalmology;  Laterality: Right;  [place] 00:07.0 CDE 0.16 FLUID PACK lOT # [id number]  . CATARACT EXTRACTION W/PHACO Left 12/24/2018   Procedure: CATARACT EXTRACTION PHACO AND INTRAOCULAR LENS PLACEMENT (IOC);  Surgeon: Eulogio Bear, MD;  Location: [facility] OR[facility];  Service: Ophthalmology;  Laterality: Left;  [place] 00:30.0 CDE 2.01 FLUID PACK LOT # [id number]  . COLONOSCOPY WITH PROPOFOL N/A 04/23/2017   Procedure: COLONOSCOPY WITH PROPOFOL;  Surgeon: [name], MD;  Location: [facility] ENDOSCOPY;  Service: Endoscopy;  Laterality: N/A;  . COLONOSCOPY WITH PROPOFOL N/A 07/02/2017   Procedure: COLONOSCOPY WITH PROPOFOL;  Surgeon: [name], MD;  Location: [facility] ENDOSCOPY;  Service: Endoscopy;  Laterality: N/A;  . ESOPHAGOGASTRODUODENOSCOPY (EGD) WITH PROPOFOL N/A 04/23/2017   Procedure: ESOPHAGOGASTRODUODENOSCOPY (EGD) WITH PROPOFOL;  Surgeon: [name], MD;  Location: [facility] ENDOSCOPY;  Service: Endoscopy;  Laterality: N/A;  . EYE SURGERY     RETINA  . PERIPHERAL VASCULAR CATHETERIZATION N/A 05/07/2015   Procedure: A/V Shuntogram/Fistulagram;  Surgeon: Katha Cabal, MD;  Location: [place] [facility];  Service: Cardiovascular;  Laterality: N/A;  . PERIPHERAL VASCULAR CATHETERIZATION Left 05/07/2015   Procedure: A/V Shunt Intervention;  Surgeon: Katha Cabal, MD;  Location: [place] [facility];  Service: Cardiovascular;  Laterality:  Left;  . TEE WITHOUT CARDIOVERSION N/A 07/05/2015   Procedure: TRANSESOPHAGEAL ECHOCARDIOGRAM (TEE);  Surgeon: Dionisio David, MD;  Location: [facility] ORS;  Service: Cardiovascular;  Laterality: N/A;  . UPPER EXTREMITY ANGIOGRAPHY Left 06/24/2018   Procedure: UPPER EXTREMITY ANGIOGRAPHY;  Surgeon: Algernon Huxley, MD;  Location: [place] [facility];  Service: Cardiovascular;  Laterality: Left;    Social History Social History   Tobacco Use  . Smoking status: Never Smoker  . Smokeless tobacco: Never Used  Substance Use Topics  . Alcohol use: Yes    Comment: occ. shot  . Drug use: No    Family History No bleeding or clotting disorders  Allergies  Allergen Reactions  . Cephalexin Nausea Only  . Ivp Dye [Iodinated Diagnostic Agents] Diarrhea  . Lisinopril Other (See Comments)    Pt cannot recall reaction     REVIEW OF SYSTEMS (Negative unless checked)  Constitutional: [] Weight loss  [] Fever  [] Chills Cardiac: [] Chest pain   [] Chest pressure   [] Palpitations   [] Shortness of breath when laying flat   [] Shortness of breath at rest   [] Shortness of breath with exertion. Vascular:  [] Pain in legs with walking   [] Pain in legs at rest   [] Pain in legs when laying flat   [] Claudication   [] Pain in feet when walking  [] Pain in feet at rest  [] Pain in feet when laying flat   [] History of DVT   [] Phlebitis   [] Swelling in legs   [] Varicose veins   [] Non-healing ulcers Pulmonary:   [] Uses home oxygen   [] Productive cough   [] Hemoptysis   [] Wheeze  [] COPD   [] Asthma Neurologic:  [] Dizziness  [] Blackouts   [] Seizures   [] History of stroke   [] History of TIA  [] Aphasia   [] Temporary blindness   [] Dysphagia   [x] Weakness or numbness in arms   [] Weakness or numbness in legs Musculoskeletal:  [x] Arthritis   [] Joint swelling   [] Joint pain   [] Low back pain Hematologic:  [] Easy bruising  [] Easy bleeding   [] Hypercoagulable state   [x] Anemic   Gastrointestinal:  [] Blood in stool   [] Vomiting blood   [] Gastroesophageal reflux/heartburn   [] Abdominal pain Genitourinary:  [x] Chronic kidney disease   [] Difficult urination  [] Frequent urination  [] Burning with urination   [] Hematuria Skin:  [] Rashes   [] Ulcers   [] Wounds Psychological:  [] History of anxiety   []  History of major depression.  Physical Examination  BP 99/63 (BP Location: Right Arm)   Pulse 85   Resp 16   Wt 277 lb (125.6 kg)   BMI 36.55 kg/m  Gen:  WD/WN, NAD Head: [place]/AT, No temporalis wasting. Ear/Nose/Throat: Hearing grossly intact, nares w/o erythema or drainage Eyes: Conjunctiva clear. Sclera non-icteric Neck: Supple.  Trachea midline Pulmonary:  Good air movement, no use of accessory muscles.  Cardiac: RRR, no JVD Vascular: Somewhat aneurysmal left brachiocephalic AV fistula with a good thrill present Vessel Right Left  Radial Palpable Palpable  Musculoskeletal: M/S 5/5 throughout.  No deformity or atrophy. Neurologic: Sensation grossly intact in extremities.  Symmetrical.  Speech is fluent.  Psychiatric: Judgment intact, Mood & affect appropriate for pt's clinical situation. Dermatologic: No rashes or ulcers noted.  No cellulitis or open wounds.       Labs Recent Results (from the past 2160 hour(s))  Novel Coronavirus, NAA ([facility])     Status: Abnormal   Collection Time: 12/01/18 12:00 AM   Specimen: Oropharyngeal(OP) collection in vial transport medium   OROPHARYNGEA  TESTING  Result Value Ref Range   SARS-CoV-2, NAA Detected (A) Not Detected    Comment: This test was developed and its performance characteristics determined by [employer]. This test has not been FDA cleared or approved. This test has been authorized by FDA under an Emergency Use Authorization (EUA). This test is only authorized for the duration of time the declaration that circumstances exist justifying the authorization of the emergency use of in vitro diagnostic tests for detection of  SARS-CoV-2 virus and/or diagnosis of COVID-19 infection under section 564(b)(1) of the Act, 21 U.S.C. [id number]), unless the authorization is terminated or revoked sooner. When diagnostic testing is negative, the possibility of a false negative result should be considered in the context of a patient's recent exposures and the presence of clinical signs and symptoms consistent with COVID-19. An individual without symptoms of COVID-19 and who is not shedding SARS-CoV-2 virus would expect to have a negative (not detected) result in this assay.   Glucose, capillary     Status: Abnormal   Collection Time: 12/24/18  7:40 AM  Result Value Ref Range   Glucose-Capillary 327 (H) 70 - 99 mg/dL  I-STAT 4, (NA,K, GLUC, HGB,HCT)     Status: Abnormal   Collection Time: 12/24/18  7:53 AM  Result Value Ref Range   Sodium 137 135 - 145 mmol/L   Potassium 4.0 3.5 - 5.1 mmol/L   Glucose, Bld 303 (H) 70 - 99 mg/dL   HCT 43.0 39.0 - 52.0 %   Hemoglobin 14.6 13.0 - 17.0 g/dL  Glucose, capillary     Status: Abnormal   Collection Time: 12/24/18  8:47 AM  Result Value Ref Range   Glucose-Capillary 297 (H) 70 - 99 mg/dL  Glucose, capillary     Status: Abnormal   Collection Time: 12/24/18 10:18 AM  Result Value Ref Range   Glucose-Capillary 217 (H) 70 - 99 mg/dL    Radiology No results found.  Assessment/Plan  Diabetes ([place]) Likely an underlying cause of his renal failure and blood glucose control important in reducing the progression of atherosclerotic disease. Also, involved in wound healing. On appropriate medications.   Essential hypertension Likely an underlying cause of ESRD and blood pressure control important in reducing the progression of atherosclerotic disease. On appropriate oral medications.   ESRD on dialysis [facility]) Duplex today shows some tortuosity and aneurysmal degeneration of the access sites without any significant stenosis and a patent left brachiocephalic AV fistula is  present No role for intervention at this time.  His still symptoms are not severe enough to warrant banding or ligation of the fistula.  I will plan to see him back in 1 year unless his symptoms worsen in the interim    [name], MD  01/25/2019 12:20 PM    This note was created with Dragon medical transcription system.  Any errors from dictation are purely unintentional

## 2019-01-25 NOTE — Assessment & Plan Note (Signed)
Duplex today shows some tortuosity and aneurysmal degeneration of the access sites without any significant stenosis and a patent left brachiocephalic AV fistula is present No role for intervention at this time.  His still symptoms are not severe enough to warrant banding or ligation of the fistula.  I will plan to see him back in 1 year unless his symptoms worsen in the interim

## 2019-01-25 NOTE — Assessment & Plan Note (Signed)
Likely an underlying cause of his renal failure and blood glucose control important in reducing the progression of atherosclerotic disease. Also, involved in wound healing. On appropriate medications.  

## 2019-05-25 ENCOUNTER — Other Ambulatory Visit: Payer: Self-pay | Admitting: Student

## 2019-05-25 DIAGNOSIS — K8689 Other specified diseases of pancreas: Secondary | ICD-10-CM

## 2019-06-06 ENCOUNTER — Other Ambulatory Visit: Payer: Self-pay | Admitting: Student

## 2019-06-06 DIAGNOSIS — K8689 Other specified diseases of pancreas: Secondary | ICD-10-CM

## 2019-06-07 ENCOUNTER — Ambulatory Visit
Admission: RE | Admit: 2019-06-07 | Discharge: 2019-06-07 | Disposition: A | Discharge status: Home / Self Care | Payer: Medicare Other | Source: Ambulatory Visit | Attending: Student | Admitting: Student

## 2019-06-07 ENCOUNTER — Other Ambulatory Visit: Payer: Self-pay

## 2019-06-07 DIAGNOSIS — K8689 Other specified diseases of pancreas: Secondary | ICD-10-CM | POA: Diagnosis present

## 2019-12-07 ENCOUNTER — Other Ambulatory Visit: Payer: Self-pay | Admitting: Nurse Practitioner

## 2019-12-07 DIAGNOSIS — G8928 Other chronic postprocedural pain: Secondary | ICD-10-CM

## 2019-12-27 ENCOUNTER — Other Ambulatory Visit: Payer: Self-pay

## 2019-12-27 ENCOUNTER — Ambulatory Visit
Admission: RE | Admit: 2019-12-27 | Discharge: 2019-12-27 | Disposition: A | Discharge status: Home / Self Care | Payer: Medicare Other | Source: Ambulatory Visit | Attending: Nurse Practitioner | Admitting: Nurse Practitioner

## 2019-12-27 DIAGNOSIS — Y842 Radiological procedure and radiotherapy as the cause of abnormal reaction of the patient, or of later complication, without mention of misadventure at the time of the procedure: Secondary | ICD-10-CM | POA: Insufficient documentation

## 2019-12-27 DIAGNOSIS — M5416 Radiculopathy, lumbar region: Secondary | ICD-10-CM | POA: Insufficient documentation

## 2019-12-27 DIAGNOSIS — G8928 Other chronic postprocedural pain: Secondary | ICD-10-CM | POA: Diagnosis not present

## 2019-12-28 ENCOUNTER — Other Ambulatory Visit (HOSPITAL_COMMUNITY): Payer: Self-pay | Admitting: Gastroenterology

## 2019-12-28 ENCOUNTER — Other Ambulatory Visit: Payer: Self-pay | Admitting: Gastroenterology

## 2019-12-28 DIAGNOSIS — R1312 Dysphagia, oropharyngeal phase: Secondary | ICD-10-CM

## 2019-12-30 ENCOUNTER — Ambulatory Visit: Payer: Medicare Other | Attending: Gastroenterology

## 2020-01-26 ENCOUNTER — Ambulatory Visit: Payer: Medicare Other

## 2020-01-31 ENCOUNTER — Ambulatory Visit
Admission: RE | Admit: 2020-01-31 | Discharge: 2020-01-31 | Disposition: A | Discharge status: Home / Self Care | Payer: Medicare Other | Source: Ambulatory Visit | Attending: Gastroenterology | Admitting: Gastroenterology

## 2020-01-31 ENCOUNTER — Other Ambulatory Visit: Payer: Self-pay

## 2020-01-31 ENCOUNTER — Ambulatory Visit (INDEPENDENT_AMBULATORY_CARE_PROVIDER_SITE_OTHER): Payer: Medicare Other | Admitting: Vascular Surgery

## 2020-01-31 ENCOUNTER — Encounter (INDEPENDENT_AMBULATORY_CARE_PROVIDER_SITE_OTHER): Payer: Medicare Other

## 2020-01-31 DIAGNOSIS — R1312 Dysphagia, oropharyngeal phase: Secondary | ICD-10-CM | POA: Insufficient documentation

## 2020-02-02 ENCOUNTER — Ambulatory Visit (INDEPENDENT_AMBULATORY_CARE_PROVIDER_SITE_OTHER): Payer: Medicare Other | Admitting: Nurse Practitioner

## 2020-02-02 ENCOUNTER — Ambulatory Visit (INDEPENDENT_AMBULATORY_CARE_PROVIDER_SITE_OTHER): Payer: Medicare Other

## 2020-02-02 ENCOUNTER — Encounter (INDEPENDENT_AMBULATORY_CARE_PROVIDER_SITE_OTHER): Payer: Self-pay | Admitting: Nurse Practitioner

## 2020-02-02 ENCOUNTER — Other Ambulatory Visit: Payer: Self-pay

## 2020-02-02 VITALS — BP 112/74 | HR 84 | Resp 16 | Wt 280.0 lb

## 2020-02-02 DIAGNOSIS — E1165 Type 2 diabetes mellitus with hyperglycemia: Secondary | ICD-10-CM | POA: Diagnosis not present

## 2020-02-02 DIAGNOSIS — Z794 Long term (current) use of insulin: Secondary | ICD-10-CM | POA: Diagnosis not present

## 2020-02-02 DIAGNOSIS — N186 End stage renal disease: Secondary | ICD-10-CM | POA: Diagnosis not present

## 2020-02-02 DIAGNOSIS — Z992 Dependence on renal dialysis: Secondary | ICD-10-CM | POA: Diagnosis not present

## 2020-02-02 DIAGNOSIS — I10 Essential (primary) hypertension: Secondary | ICD-10-CM

## 2020-02-07 ENCOUNTER — Encounter (INDEPENDENT_AMBULATORY_CARE_PROVIDER_SITE_OTHER): Payer: Self-pay | Admitting: Nurse Practitioner

## 2020-02-07 NOTE — Progress Notes (Signed)
Subjective:    Patient ID: [name] [name], male    DOB: 24-Dec-1975, 44 y.o.   MRN: [id number] Chief Complaint  Patient presents with  . Follow-up    ultrasound follow up    The patient returns to the office for followup of their dialysis access. The function of the access has been stable. The patient denies increased bleeding time or increased recirculation. Patient denies difficulty with cannulation. The patient denies hand pain or other symptoms consistent with steal phenomena.  No significant arm swelling.  The patient denies redness or swelling at the access site. The patient denies fever or chills at home or while on dialysis.  The patient denies amaurosis fugax or recent TIA symptoms. There are no recent neurological changes noted. The patient denies claudication symptoms or rest pain symptoms. The patient denies history of DVT, PE or superficial thrombophlebitis. The patient denies recent episodes of angina or shortness of breath.     The patient has a flow volume of 3003.  There is no area of significant stenosis.  Fistula is patent.   Review of Systems  Hematological: Bruises/bleeds easily.       Objective:   Physical Exam Vitals reviewed.  HENT:     Head: Normocephalic.  Cardiovascular:     Rate and Rhythm: Normal rate.     Pulses: Normal pulses.          Radial pulses are 2+ on the left side.     Arteriovenous access: left arteriovenous access is present.    Comments: Left brachiocephalic AV fistula with good thrill and bruit Pulmonary:     Effort: Pulmonary effort is normal.  Musculoskeletal:        General: Normal range of motion.  Skin:    General: Skin is warm and dry.  Neurological:     Mental Status: He is alert and oriented to person, place, and time.  Psychiatric:        Mood and Affect: Mood normal.        Behavior: Behavior normal.        Thought Content: Thought content normal.        Judgment: Judgment normal.     BP 112/74 (BP  Location: Right Arm)   Pulse 84   Resp 16   Wt 280 lb (127 kg)   BMI 36.94 kg/m   Past Medical History:  Diagnosis Date  . CHF (congestive heart failure) ([place])   . Coronary artery disease   . Diabetes mellitus without complication ([place])   . Dialysis patient [facility])    M-W-F  . GERD (gastroesophageal reflux disease)   . Hypertension   . Kyrle's disease   . Renal disorder    on dialysis for 4 years three times a week last was Tuesday  . Shortness of breath dyspnea     Social History   Socioeconomic History  . Marital status: Single    Spouse name: Not on file  . Number of children: Not on file  . Years of education: Not on file  . Highest education level: Not on file  Occupational History  . Not on file  Tobacco Use  . Smoking status: Never Smoker  . Smokeless tobacco: Never Used  Vaping Use  . Vaping Use: Never used  Substance and Sexual Activity  . Alcohol use: Yes    Comment: occ. shot  . Drug use: No  . Sexual activity: Not on file  Other Topics Concern  . Not on file  Social History Narrative  . Not on file   Social Determinants of Health   Financial Resource Strain:   . Difficulty of Paying Living Expenses: Not on file  Food Insecurity:   . Worried About Charity fundraiser in the Last Year: Not on file  . Ran Out of Food in the Last Year: Not on file  Transportation Needs:   . Lack of Transportation (Medical): Not on file  . Lack of Transportation (Non-Medical): Not on file  Physical Activity:   . Days of Exercise per Week: Not on file  . Minutes of Exercise per Session: Not on file  Stress:   . Feeling of Stress : Not on file  Social Connections:   . Frequency of Communication with Friends and Family: Not on file  . Frequency of Social Gatherings with Friends and Family: Not on file  . Attends Religious Services: Not on file  . Active Member of Clubs or Organizations: Not on file  . Attends Archivist Meetings: Not on file  . Marital  Status: Not on file  Intimate Partner Violence:   . Fear of Current or Ex-Partner: Not on file  . Emotionally Abused: Not on file  . Physically Abused: Not on file  . Sexually Abused: Not on file    Past Surgical History:  Procedure Laterality Date  . A/V FISTULAGRAM Left 10/01/2017   Procedure: A/V FISTULAGRAM;  Surgeon: Algernon Huxley, MD;  Location: [place] [facility];  Service: Cardiovascular;  Laterality: Left;  . CATARACT EXTRACTION W/PHACO Right 06/30/2018   Procedure: CATARACT EXTRACTION PHACO AND INTRAOCULAR LENS PLACEMENT (IOC)-RIGHT;  Surgeon: Eulogio Bear, MD;  Location: [facility] ORS;  Service: Ophthalmology;  Laterality: Right;  [place] 00:07.0 CDE 0.16 FLUID PACK lOT # [id number]  . CATARACT EXTRACTION W/PHACO Left 12/24/2018   Procedure: CATARACT EXTRACTION PHACO AND INTRAOCULAR LENS PLACEMENT (IOC);  Surgeon: Eulogio Bear, MD;  Location: [facility] ORS;  Service: Ophthalmology;  Laterality: Left;  [place] 00:30.0 CDE 2.01 FLUID PACK LOT # [id number]  . COLONOSCOPY WITH PROPOFOL N/A 04/23/2017   Procedure: COLONOSCOPY WITH PROPOFOL;  Surgeon: [name], MD;  Location: [facility] ENDOSCOPY;  Service: Endoscopy;  Laterality: N/A;  . COLONOSCOPY WITH PROPOFOL N/A 07/02/2017   Procedure: COLONOSCOPY WITH PROPOFOL;  Surgeon: [name], MD;  Location: [facility];  Service: Endoscopy;  Laterality: N/A;  . ESOPHAGOGASTRODUODENOSCOPY (EGD) WITH PROPOFOL N/A 04/23/2017   Procedure: ESOPHAGOGASTRODUODENOSCOPY (EGD) WITH PROPOFOL;  Surgeon: [name], MD;  Location: [facility] ENDOSCOPY;  Service: Endoscopy;  Laterality: N/A;  . EYE SURGERY     RETINA  . PERIPHERAL VASCULAR CATHETERIZATION N/A 05/07/2015   Procedure: A/V Shuntogram/Fistulagram;  Surgeon: Katha Cabal, MD;  Location: [place] [facility];  Service: Cardiovascular;  Laterality: N/A;  . PERIPHERAL VASCULAR CATHETERIZATION Left 05/07/2015   Procedure: A/V Shunt Intervention;  Surgeon: Katha Cabal, MD;  Location:  [place] [facility];  Service: Cardiovascular;  Laterality: Left;  . TEE WITHOUT CARDIOVERSION N/A 07/05/2015   Procedure: TRANSESOPHAGEAL ECHOCARDIOGRAM (TEE);  Surgeon: Dionisio David, MD;  Location: [facility] ORS;  Service: Cardiovascular;  Laterality: N/A;  . UPPER EXTREMITY ANGIOGRAPHY Left 06/24/2018   Procedure: UPPER EXTREMITY ANGIOGRAPHY;  Surgeon: Algernon Huxley, MD;  Location: [place] [facility];  Service: Cardiovascular;  Laterality: Left;    History reviewed. No pertinent family history.  Allergies  Allergen Reactions  . Cephalexin Nausea Only  . Ivp Dye [Iodinated Diagnostic Agents] Diarrhea  . Lisinopril Other (  See Comments)    Pt cannot recall reaction  . Other        Assessment & Plan:   1. End stage renal disease ([place]) Recommend:  The patient is doing well and currently has adequate dialysis access. The patient's dialysis center is not reporting any access issues. Flow pattern is stable when compared to the prior ultrasound.  The patient should have a duplex ultrasound of the dialysis access in 6 months. The patient will follow-up with me in the office after each ultrasound     2. Primary hypertension Continue antihypertensive medications as already ordered, these medications have been reviewed and there are no changes at this time.   3. Uncontrolled type 2 diabetes mellitus with hyperglycemia, with long-term current use of insulin (HCC) Continue hypoglycemic medications as already ordered, these medications have been reviewed and there are no changes at this time.  Hgb A1C to be monitored as already arranged by primary service    Current Outpatient Medications on File Prior to Visit  Medication Sig Dispense Refill  . aspirin EC 81 MG tablet Take 81 mg by mouth daily at 6 PM. (1700)    . carvedilol (COREG) 12.5 MG tablet Take 6.25 mg by mouth daily with supper.     Marland Kitchen HUMULIN 70/30 (70-30) 100 UNIT/ML injection Inject 10-30 Units into the skin See admin  instructions. 10 units before breakfast and 30 units before supper ON DIALYSIS DAYS (M-W-F), NO AM INSULIN    . lanthanum (FOSRENOL) 1000 MG chewable tablet Chew 2,000 mg by mouth 3 (three) times daily with meals.    Marland Kitchen losartan (COZAAR) 50 MG tablet Take 25 mg by mouth daily. (1700)    . SENSIPAR 60 MG tablet Take 60 mg by mouth daily at 6 PM. 1700  3  . HYDROcodone-acetaminophen (NORCO/VICODIN) 5-325 MG tablet Take 1 tablet by mouth every 6 (six) hours as needed for moderate pain. (Patient not taking: Reported on 01/25/2019) 15 tablet 0   No current facility-administered medications on file prior to visit.    There are no Patient Instructions on file for this visit. No follow-ups on file.   Kris Hartmann, NP

## 2020-03-21 ENCOUNTER — Other Ambulatory Visit: Payer: Self-pay

## 2020-03-21 ENCOUNTER — Other Ambulatory Visit
Admission: RE | Admit: 2020-03-21 | Discharge: 2020-03-21 | Disposition: A | Discharge status: Home / Self Care | Payer: Medicare Other | Source: Ambulatory Visit | Attending: Gastroenterology | Admitting: Gastroenterology

## 2020-03-21 DIAGNOSIS — Z01812 Encounter for preprocedural laboratory examination: Secondary | ICD-10-CM | POA: Insufficient documentation

## 2020-03-21 DIAGNOSIS — Z20822 Contact with and (suspected) exposure to covid-19: Secondary | ICD-10-CM | POA: Insufficient documentation

## 2020-03-21 LAB — SARS CORONAVIRUS 2 (TAT 6-24 HRS): SARS Coronavirus 2: NEGATIVE

## 2020-03-23 ENCOUNTER — Other Ambulatory Visit: Payer: Medicare Other

## 2020-03-27 ENCOUNTER — Ambulatory Visit
Admission: RE | Admit: 2020-03-27 | Discharge: 2020-03-27 | Disposition: A | Discharge status: Home / Self Care | Payer: Medicare Other | Attending: Gastroenterology | Admitting: Gastroenterology

## 2020-03-27 ENCOUNTER — Encounter: Payer: Self-pay | Admitting: *Deleted

## 2020-03-27 ENCOUNTER — Ambulatory Visit: Payer: Medicare Other | Admitting: Anesthesiology

## 2020-03-27 ENCOUNTER — Other Ambulatory Visit: Payer: Self-pay

## 2020-03-27 ENCOUNTER — Encounter
Admission: RE | Disposition: A | Discharge status: Home / Self Care | Payer: Self-pay | Source: Home / Self Care | Attending: Gastroenterology

## 2020-03-27 DIAGNOSIS — X58XXXA Exposure to other specified factors, initial encounter: Secondary | ICD-10-CM | POA: Insufficient documentation

## 2020-03-27 DIAGNOSIS — Z888 Allergy status to other drugs, medicaments and biological substances status: Secondary | ICD-10-CM | POA: Diagnosis not present

## 2020-03-27 DIAGNOSIS — T182XXA Foreign body in stomach, initial encounter: Secondary | ICD-10-CM | POA: Insufficient documentation

## 2020-03-27 DIAGNOSIS — Z7982 Long term (current) use of aspirin: Secondary | ICD-10-CM | POA: Diagnosis not present

## 2020-03-27 DIAGNOSIS — R1310 Dysphagia, unspecified: Secondary | ICD-10-CM | POA: Insufficient documentation

## 2020-03-27 DIAGNOSIS — K2289 Other specified disease of esophagus: Secondary | ICD-10-CM | POA: Insufficient documentation

## 2020-03-27 DIAGNOSIS — Z881 Allergy status to other antibiotic agents status: Secondary | ICD-10-CM | POA: Diagnosis not present

## 2020-03-27 DIAGNOSIS — Z794 Long term (current) use of insulin: Secondary | ICD-10-CM | POA: Diagnosis not present

## 2020-03-27 DIAGNOSIS — Z992 Dependence on renal dialysis: Secondary | ICD-10-CM | POA: Diagnosis not present

## 2020-03-27 DIAGNOSIS — Z79899 Other long term (current) drug therapy: Secondary | ICD-10-CM | POA: Insufficient documentation

## 2020-03-27 HISTORY — PX: ESOPHAGOGASTRODUODENOSCOPY (EGD) WITH PROPOFOL: SHX5813

## 2020-03-27 HISTORY — DX: Type 2 diabetes mellitus with unspecified diabetic retinopathy without macular edema: E11.319

## 2020-03-27 HISTORY — DX: Type 2 diabetes mellitus with diabetic nephropathy: E11.21

## 2020-03-27 HISTORY — DX: Secondary hyperparathyroidism of renal origin: N25.81

## 2020-03-27 LAB — KOH PREP

## 2020-03-27 LAB — GLUCOSE, CAPILLARY: Glucose-Capillary: 374 mg/dL — ABNORMAL HIGH (ref 70–99)

## 2020-03-27 SURGERY — ESOPHAGOGASTRODUODENOSCOPY (EGD) WITH PROPOFOL
Anesthesia: General

## 2020-03-27 MED ORDER — PROPOFOL 10 MG/ML IV BOLUS
INTRAVENOUS | Status: AC
  Filled 2020-03-27: qty 40

## 2020-03-27 MED ORDER — LIDOCAINE HCL (CARDIAC) PF 100 MG/5ML IV SOSY
PREFILLED_SYRINGE | INTRAVENOUS | Status: DC | PRN
  Administered 2020-03-27: 100 mg via INTRAVENOUS

## 2020-03-27 MED ORDER — PROPOFOL 10 MG/ML IV BOLUS
INTRAVENOUS | Status: DC | PRN
  Administered 2020-03-27: 110 mg via INTRAVENOUS
  Administered 2020-03-27: 30 mg via INTRAVENOUS

## 2020-03-27 MED ORDER — SODIUM CHLORIDE 0.9 % IV SOLN
INTRAVENOUS | Status: DC
  Administered 2020-03-27: 500 mL via INTRAVENOUS

## 2020-03-27 NOTE — H&P (Signed)
Outpatient short stay form Pre-procedure 03/27/2020 8:06 AM [name] Miyamoto MD, MPH  Primary Physician: NP Charlynn Grimes  Reason for visit:  Dysphagia  History of present illness:   44 y/o gentleman with DM II, ESRD, and history of dysphagia. Patient endorses more swallowing difficulties with liquids than solids. Denies blood thinners, neck surgeries, or family history of GI malignancies although not the best historian.    Current Facility-Administered Medications:  .  0.9 %  sodium chloride infusion, , Intravenous, Continuous, Clarisse Rodriges, Hilton Cork, MD, Last Rate: 20 mL/hr at 03/27/20 0802, Continued from Pre-op at 03/27/20 0802  Medications Prior to Admission  Medication Sig Dispense Refill Last Dose  . aspirin EC 81 MG tablet Take 81 mg by mouth daily at 6 PM. (1700)   03/26/2020 at Unknown time  . calcium acetate (PHOSLO) 667 MG capsule Take by mouth in the morning and at bedtime.   03/26/2020 at Unknown time  . carvedilol (COREG) 12.5 MG tablet Take 12.5 mg by mouth 2 (two) times daily with a meal.    03/26/2020 at Unknown time  . HUMULIN 70/30 (70-30) 100 UNIT/ML injection Inject 10-30 Units into the skin See admin instructions. 10 units before breakfast and 30 units before supper ON DIALYSIS DAYS (M-W-F), NO AM INSULIN   Past Week at Unknown time  . lanthanum (FOSRENOL) 1000 MG chewable tablet Chew 2,000 mg by mouth 3 (three) times daily with meals.   Past Week at Unknown time  . loperamide (IMODIUM) 2 MG capsule Take 2 mg by mouth as needed for diarrhea or loose stools (Take 1/2-1 tablet as meeded to prevent diarrhea. Can take up to 1 tablet every 4 hours. Do not exceed 4 tablets in a day.).   Past Week at Unknown time  . losartan (COZAAR) 50 MG tablet Take 50 mg by mouth daily. (1700)   03/26/2020 at Unknown time  . SENSIPAR 60 MG tablet Take 60 mg by mouth daily at 6 PM. 1700  3 03/26/2020 at Unknown time  . HYDROcodone-acetaminophen (NORCO/VICODIN) 5-325 MG tablet Take 1 tablet by mouth  every 6 (six) hours as needed for moderate pain. (Patient not taking: Reported on 01/25/2019) 15 tablet 0      Allergies  Allergen Reactions  . Cephalexin Nausea Only  . Ivp Dye [Iodinated Diagnostic Agents] Diarrhea  . Lisinopril Other (See Comments)    Pt cannot recall reaction, cough  . Other      Past Medical History:  Diagnosis Date  . CHF (congestive heart failure) ([place])   . Coronary artery disease   . Diabetes mellitus without complication ([place])   . Dialysis patient [facility])    M-W-F  . GERD (gastroesophageal reflux disease)   . Hypertension   . Kyrle's disease   . Renal disorder    on dialysis for 4 years three times a week last was Tuesday  . Secondary hyperparathyroidism of renal origin ([place])   . Shortness of breath dyspnea   . Type 2 diabetes mellitus with diabetic nephropathy ([place])   . Type 2 diabetes mellitus with diabetic retinopathy ([place])     Review of systems:  Otherwise negative.    Physical Exam  Gen: Alert, oriented. Appears stated age.  HEENT:  PERRLA. Lungs: No respiratory distress CV: RRR Abd: soft, benign, no masses. Ext: No edema.     Planned procedures: Proceed with EGD. The patient understands the nature of the planned procedure, indications, risks, alternatives and potential complications including but not limited to bleeding, infection, perforation,  damage to internal organs and possible oversedation/side effects from anesthesia. The patient agrees and gives consent to proceed.  Please refer to procedure notes for findings, recommendations and patient disposition/instructions.     [name] Miyamoto MD, MPH Gastroenterology 03/27/2020  8:06 AM

## 2020-03-27 NOTE — Brief Op Note (Signed)
Esophageal brushing sent to lab to evaluate for [facility]

## 2020-03-27 NOTE — Progress Notes (Signed)
   03/27/20 0745  Clinical Encounter Type  Visited With Family  Visit Type Initial  Referral From Chaplain  Consult/Referral To Chaplain  While rounding SDS waiting area, chaplain briefly visited with Pt's mother. She told chaplain that she had prayer with friends before coming to the hospital. She said all was well and chaplain left.

## 2020-03-27 NOTE — Interval H&P Note (Signed)
History and Physical Interval Note:  03/27/2020 8:09 AM  [name] [name]  has presented today for surgery, with the diagnosis of DYSPHAGIA.  The various methods of treatment have been discussed with the patient and family. After consideration of risks, benefits and other options for treatment, the patient has consented to  Procedure(s): ESOPHAGOGASTRODUODENOSCOPY (EGD) WITH PROPOFOL (N/A) as a surgical intervention.  The patient's history has been reviewed, patient examined, no change in status, stable for surgery.  I have reviewed the patient's chart and labs.  Questions were answered to the patient's satisfaction.     Lesly Rubenstein  Ok to proceed with EGD

## 2020-03-27 NOTE — Anesthesia Postprocedure Evaluation (Signed)
Anesthesia Post Note  Patient: [name] [name]  Procedure(s) Performed: ESOPHAGOGASTRODUODENOSCOPY (EGD) WITH PROPOFOL (N/A )  Patient location during evaluation: Endoscopy Anesthesia Type: General Level of consciousness: awake and alert Pain management: pain level controlled Vital Signs Assessment: post-procedure vital signs reviewed and stable Respiratory status: spontaneous breathing, nonlabored ventilation, respiratory function stable and patient connected to nasal cannula oxygen Cardiovascular status: blood pressure returned to baseline and stable Postop Assessment: no apparent nausea or vomiting Anesthetic complications: no   No complications documented.   Last Vitals:  Vitals:   03/27/20 0832 03/27/20 0842  BP: (!) 154/84 (!) 180/97  Pulse:    Resp:    Temp:    SpO2:      Last Pain:  Vitals:   03/27/20 0842  TempSrc:   PainSc: 0-No pain                 Martha Clan

## 2020-03-27 NOTE — Op Note (Addendum)
[facility] Gastroenterology Patient Name: [name] [name] Procedure Date: 03/27/2020 7:57 AM MRN: [id number] Account #: [id number] Date of Birth: 03/04/1976 Admit Type: Outpatient Age: 44 Room: [facility] ENDO ROOM 1 Gender: Male Note Status: Supervisor Override Procedure:             Upper GI endoscopy Indications:           Dysphagia Providers:             Andrey Farmer MD, MD Referring MD:          Maryellen Pile (Referring MD) Medicines:             Monitored Anesthesia Care Complications:         No immediate complications. Procedure:             Pre-Anesthesia Assessment:                        - Prior to the procedure, a History and Physical was                         performed, and patient medications and allergies were                         reviewed. The patient is competent. The risks and                         benefits of the procedure and the sedation options and                         risks were discussed with the patient. All questions                         were answered and informed consent was obtained.                         Patient identification and proposed procedure were                         verified by the physician, the nurse, the anesthetist                         and the technician in the endoscopy suite. Mental                         Status Examination: alert and oriented. Airway                         Examination: normal oropharyngeal airway and neck                         mobility. Respiratory Examination: clear to                         auscultation. CV Examination: normal. Prophylactic                         Antibiotics: The patient does not require prophylactic  antibiotics. Prior Anticoagulants: The patient has                         taken no previous anticoagulant or antiplatelet                         agents. ASA Grade Assessment: III - A patient with                         severe systemic  disease. After reviewing the risks and                         benefits, the patient was deemed in satisfactory                         condition to undergo the procedure. The anesthesia                         plan was to use monitored anesthesia care (MAC).                         Immediately prior to administration of medications,                         the patient was re-assessed for adequacy to receive                         sedatives. The heart rate, respiratory rate, oxygen                         saturations, blood pressure, adequacy of pulmonary                         ventilation, and response to care were monitored                         throughout the procedure. The physical status of the                         patient was re-assessed after the procedure.                        After obtaining informed consent, the endoscope was                         passed under direct vision. Throughout the procedure,                         the patient's blood pressure, pulse, and oxygen                         saturations were monitored continuously. The Endoscope                         was introduced through the mouth, with the intention                         of advancing to the duodenum. The scope was advanced  to the gastric fundus before the procedure was                         aborted. Medications were given. The procedure was                         aborted due to presence of food. The upper GI                         endoscopy was accomplished without difficulty. The                         patient tolerated the procedure well. Findings:      White nummular lesions were noted in the lower third of the esophagus.       Brushings for KOH prep were obtained. Estimated blood loss: none.      A large amount of food (residue) was found in the gastric body. Impression:            - The procedure was aborted due to presence of food.                        -  White nummular lesions in esophageal mucosa.                         Brushings performed.                        - A large amount of food (residue) in the stomach. Recommendation:        - Discharge patient to home.                        - Resume previous diet.                        - Continue present medications.                        - Await pathology results.                        - Return to referring physician as previously                         scheduled. Procedure Code(s):     --- Professional ---                        [phone], 52, Esophagogastroduodenoscopy, flexible,                         transoral; diagnostic, including collection of                         specimen(s) by brushing or washing, when performed                         (separate procedure) Diagnosis Code(s):     --- Professional ---                        K22.8, Other specified  diseases of esophagus                        R13.10, Dysphagia, unspecified CPT copyright 2019 American Medical Association. All rights reserved. The codes documented in this report are preliminary and upon coder review may  be revised to meet current compliance requirements. Andrey Farmer, MD Andrey Farmer MD, MD 03/27/2020 8:27:38 AM Number of Addenda: 0 Note Initiated On: 03/27/2020 7:57 AM Estimated Blood Loss:  Estimated blood loss: none.      [facility]

## 2020-03-27 NOTE — Transfer of Care (Signed)
Immediate Anesthesia Transfer of Care Note  Patient: [name] [name]  Procedure(s) Performed: ESOPHAGOGASTRODUODENOSCOPY (EGD) WITH PROPOFOL (N/A )  Patient Location: PACU  Anesthesia Type:MAC  Level of Consciousness: awake and drowsy  Airway & Oxygen Therapy: Patient Spontanous Breathing  Post-op Assessment: Report given to RN and Post -op Vital signs reviewed and stable  Post vital signs: stable  Last Vitals:  Vitals Value Taken Time  BP 153/89 03/27/20 0823  Temp 36.7 C 03/27/20 0822  Pulse 77 03/27/20 0824  Resp 16 03/27/20 0824  SpO2 95 % 03/27/20 0824  Vitals shown include unvalidated device data.  Last Pain:  Vitals:   03/27/20 0822  TempSrc: Temporal  PainSc: Asleep         Complications: No complications documented.

## 2020-03-27 NOTE — Anesthesia Preprocedure Evaluation (Signed)
Anesthesia Evaluation  Patient identified by MRN, date of birth, ID band Patient awake    Reviewed: Allergy & Precautions, H&P , NPO status , Patient's Chart, lab work & pertinent test results  History of Anesthesia Complications Negative for: history of anesthetic complications  Airway Mallampati: III  TM Distance: >3 FB     Dental  (+) Missing, Chipped, Poor Dentition, Dental Advidsory Given   Pulmonary shortness of breath, neg COPD, neg recent URI,           Cardiovascular Exercise Tolerance: Good hypertension, + CAD and +CHF  (-) Past MI (-) dysrhythmias (-) Valvular Problems/Murmurs     Neuro/Psych negative neurological ROS  negative psych ROS   GI/Hepatic Neg liver ROS, GERD  Controlled,  Endo/Other  diabetes, Insulin Dependent  Renal/GU ESRF and DialysisRenal disease     Musculoskeletal   Abdominal   Peds  Hematology negative hematology ROS (+)   Anesthesia Other Findings Past Medical History: No date: CHF (congestive heart failure) (HCC) No date: Coronary artery disease No date: Diabetes mellitus without complication (HCC) No date: Dialysis patient [facility])     Comment:  M-W-F No date: GERD (gastroesophageal reflux disease) No date: Hypertension 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 No date: Renal disorder     Comment:  on dialysis for 4 years three times a week last was               Tuesday No date: Shortness of breath dyspnea  Past Surgical History: 10/01/2017: A/V FISTULAGRAM; Left     Comment:  Procedure: A/V FISTULAGRAM;  Surgeon: Algernon Huxley, MD;               Location: [place] [facility];  Service: Cardiovascular;              Laterality: Left; 06/30/2018: CATARACT EXTRACTION W/PHACO; Right     Comment:  Procedure: CATARACT EXTRACTION PHACO AND INTRAOCULAR               LENS PLACEMENT (IOC)-RIGHT;  Surgeon: Eulogio Bear,              MD;  Location: [facility] ORS;  Service: Ophthalmology;                 Laterality: Right;  [place] 00:07.0 CDE 0.16 FLUID PACK lOT               # [id number] 04/23/2017: COLONOSCOPY WITH PROPOFOL; N/A     Comment:  Procedure: COLONOSCOPY WITH PROPOFOL;  Surgeon:               [name], MD;  Location: [facility] ENDOSCOPY;                Service: Endoscopy;  Laterality: N/A; 07/02/2017: COLONOSCOPY WITH PROPOFOL; N/A     Comment:  Procedure: COLONOSCOPY WITH PROPOFOL;  Surgeon:               [name], MD;  Location: [facility] ENDOSCOPY;                Service: Endoscopy;  Laterality: N/A; 04/23/2017: ESOPHAGOGASTRODUODENOSCOPY (EGD) WITH PROPOFOL; N/A     Comment:  Procedure: ESOPHAGOGASTRODUODENOSCOPY (EGD) WITH               PROPOFOL;  Surgeon: [name], MD;  Location:               [facility] ENDOSCOPY;  Service: Endoscopy;  Laterality: N/A; No date: EYE SURGERY  Comment:  RETINA 05/07/2015: PERIPHERAL VASCULAR CATHETERIZATION; N/A     Comment:  Procedure: A/V Shuntogram/Fistulagram;  Surgeon: Katha Cabal, MD;  Location: [place] [facility];  Service:              Cardiovascular;  Laterality: N/A; 05/07/2015: PERIPHERAL VASCULAR CATHETERIZATION; Left     Comment:  Procedure: A/V Shunt Intervention;  Surgeon: Katha Cabal, MD;  Location: [place] [facility];  Service:               Cardiovascular;  Laterality: Left; 07/05/2015: TEE WITHOUT CARDIOVERSION; N/A     Comment:  Procedure: TRANSESOPHAGEAL ECHOCARDIOGRAM (TEE);                Surgeon: Dionisio David, MD;  Location: [facility] ORS;                Service: Cardiovascular;  Laterality: N/A; 06/24/2018: UPPER EXTREMITY ANGIOGRAPHY; Left     Comment:  Procedure: UPPER EXTREMITY ANGIOGRAPHY;  Surgeon: Algernon Huxley, MD;  Location: [place] [facility];  Service:               Cardiovascular;  Laterality: Left;  BMI    Body Mass Index: 37.73 kg/m      Reproductive/Obstetrics negative OB ROS                              Anesthesia Physical  Anesthesia Plan  ASA: III  Anesthesia Plan: General   Post-op Pain Management:    Induction: Intravenous  PONV Risk Score and Plan: 2 and Propofol infusion, TIVA and Treatment may vary due to age or medical condition  Airway Management Planned: Nasal Cannula and Natural Airway  Additional Equipment:   Intra-op Plan:   Post-operative Plan:   Informed Consent: I have reviewed the patients History and Physical, chart, labs and discussed the procedure including the risks, benefits and alternatives for the proposed anesthesia with the patient or authorized representative who has indicated his/her understanding and acceptance.       Plan Discussed with: Anesthesiologist and CRNA  Anesthesia Plan Comments:         Anesthesia Quick Evaluation

## 2020-03-28 ENCOUNTER — Encounter: Payer: Self-pay | Admitting: Gastroenterology

## 2020-05-04 ENCOUNTER — Other Ambulatory Visit: Payer: Self-pay | Admitting: Gastroenterology

## 2020-05-04 DIAGNOSIS — R1312 Dysphagia, oropharyngeal phase: Secondary | ICD-10-CM

## 2020-05-08 ENCOUNTER — Telehealth (INDEPENDENT_AMBULATORY_CARE_PROVIDER_SITE_OTHER): Payer: Self-pay

## 2020-05-08 NOTE — Telephone Encounter (Signed)
Patient returned my call and is now scheduled with Dr. Lucky Cowboy for a left arm fistulagram with a 6:45 am on 05/17/20 at the [facility]. Covid testing on 05/15/20 between 8-1 pm at the [place]. Pre-procedure instructions were discussed and will be mailed.

## 2020-05-15 ENCOUNTER — Other Ambulatory Visit
Admission: RE | Admit: 2020-05-15 | Discharge: 2020-05-15 | Disposition: A | Discharge status: Home / Self Care | Payer: Medicare Other | Source: Ambulatory Visit | Attending: Vascular Surgery | Admitting: Vascular Surgery

## 2020-05-15 ENCOUNTER — Other Ambulatory Visit: Payer: Self-pay

## 2020-05-15 DIAGNOSIS — Z20822 Contact with and (suspected) exposure to covid-19: Secondary | ICD-10-CM | POA: Diagnosis not present

## 2020-05-15 DIAGNOSIS — Z01812 Encounter for preprocedural laboratory examination: Secondary | ICD-10-CM | POA: Diagnosis present

## 2020-05-16 LAB — SARS CORONAVIRUS 2 (TAT 6-24 HRS): SARS Coronavirus 2: NEGATIVE

## 2020-05-17 ENCOUNTER — Other Ambulatory Visit (INDEPENDENT_AMBULATORY_CARE_PROVIDER_SITE_OTHER): Payer: Self-pay | Admitting: Nurse Practitioner

## 2020-05-17 ENCOUNTER — Encounter
Admission: RE | Disposition: A | Discharge status: Home / Self Care | Payer: Self-pay | Source: Home / Self Care | Attending: Vascular Surgery

## 2020-05-17 ENCOUNTER — Ambulatory Visit
Admission: RE | Admit: 2020-05-17 | Discharge: 2020-05-17 | Disposition: A | Discharge status: Home / Self Care | Payer: Medicare Other | Attending: Vascular Surgery | Admitting: Vascular Surgery

## 2020-05-17 ENCOUNTER — Other Ambulatory Visit: Payer: Self-pay

## 2020-05-17 ENCOUNTER — Encounter: Payer: Self-pay | Admitting: Vascular Surgery

## 2020-05-17 DIAGNOSIS — E1122 Type 2 diabetes mellitus with diabetic chronic kidney disease: Secondary | ICD-10-CM | POA: Insufficient documentation

## 2020-05-17 DIAGNOSIS — Z881 Allergy status to other antibiotic agents status: Secondary | ICD-10-CM | POA: Diagnosis not present

## 2020-05-17 DIAGNOSIS — Z888 Allergy status to other drugs, medicaments and biological substances status: Secondary | ICD-10-CM | POA: Diagnosis not present

## 2020-05-17 DIAGNOSIS — I2510 Atherosclerotic heart disease of native coronary artery without angina pectoris: Secondary | ICD-10-CM | POA: Diagnosis not present

## 2020-05-17 DIAGNOSIS — Y841 Kidney dialysis as the cause of abnormal reaction of the patient, or of later complication, without mention of misadventure at the time of the procedure: Secondary | ICD-10-CM | POA: Insufficient documentation

## 2020-05-17 DIAGNOSIS — Z992 Dependence on renal dialysis: Secondary | ICD-10-CM | POA: Diagnosis not present

## 2020-05-17 DIAGNOSIS — T82898A Other specified complication of vascular prosthetic devices, implants and grafts, initial encounter: Secondary | ICD-10-CM | POA: Diagnosis not present

## 2020-05-17 DIAGNOSIS — N186 End stage renal disease: Secondary | ICD-10-CM | POA: Insufficient documentation

## 2020-05-17 DIAGNOSIS — I120 Hypertensive chronic kidney disease with stage 5 chronic kidney disease or end stage renal disease: Secondary | ICD-10-CM | POA: Insufficient documentation

## 2020-05-17 DIAGNOSIS — T82510A Breakdown (mechanical) of surgically created arteriovenous fistula, initial encounter: Secondary | ICD-10-CM | POA: Insufficient documentation

## 2020-05-17 HISTORY — PX: A/V FISTULAGRAM: CATH118298

## 2020-05-17 LAB — POTASSIUM (ARMC VASCULAR LAB ONLY): Potassium (ARMC vascular lab): 5.4 — ABNORMAL HIGH (ref 3.5–5.1)

## 2020-05-17 LAB — GLUCOSE, CAPILLARY
Glucose-Capillary: 420 mg/dL — ABNORMAL HIGH (ref 70–99)
Glucose-Capillary: 331 mg/dL — ABNORMAL HIGH (ref 70–99)

## 2020-05-17 SURGERY — A/V FISTULAGRAM
Anesthesia: Moderate Sedation | Laterality: Left

## 2020-05-17 MED ORDER — MIDAZOLAM HCL 5 MG/5ML IJ SOLN
INTRAMUSCULAR | Status: AC
  Filled 2020-05-17: qty 5

## 2020-05-17 MED ORDER — METHYLPREDNISOLONE SODIUM SUCC 125 MG IJ SOLR
INTRAMUSCULAR | Status: AC
  Filled 2020-05-17: qty 2

## 2020-05-17 MED ORDER — HEPARIN SODIUM (PORCINE) 1000 UNIT/ML IJ SOLN
INTRAMUSCULAR | Status: AC
  Filled 2020-05-17: qty 1

## 2020-05-17 MED ORDER — CLINDAMYCIN PHOSPHATE 300 MG/50ML IV SOLN
INTRAVENOUS | Status: AC
  Administered 2020-05-17: 300 mg via INTRAVENOUS
  Filled 2020-05-17: qty 50

## 2020-05-17 MED ORDER — FENTANYL CITRATE (PF) 100 MCG/2ML IJ SOLN
INTRAMUSCULAR | Status: DC | PRN
  Administered 2020-05-17 (×2): 50 ug via INTRAVENOUS

## 2020-05-17 MED ORDER — MIDAZOLAM HCL 2 MG/2ML IJ SOLN
INTRAMUSCULAR | Status: DC | PRN
  Administered 2020-05-17: 2 mg via INTRAVENOUS

## 2020-05-17 MED ORDER — INSULIN ASPART 100 UNIT/ML ~~LOC~~ SOLN
10.0000 [IU] | Freq: Once | SUBCUTANEOUS | Status: AC
  Administered 2020-05-17: 10 [IU] via SUBCUTANEOUS

## 2020-05-17 MED ORDER — SODIUM CHLORIDE 0.9 % IV SOLN: INTRAVENOUS | Status: DC

## 2020-05-17 MED ORDER — DIPHENHYDRAMINE HCL 50 MG/ML IJ SOLN
50.0000 mg | Freq: Once | INTRAMUSCULAR | Status: AC | PRN
  Administered 2020-05-17: 50 mg via INTRAVENOUS

## 2020-05-17 MED ORDER — HYDROMORPHONE HCL 1 MG/ML IJ SOLN: 1.0000 mg | Freq: Once | INTRAMUSCULAR | Status: DC | PRN

## 2020-05-17 MED ORDER — DIPHENHYDRAMINE HCL 50 MG/ML IJ SOLN
INTRAMUSCULAR | Status: AC
  Filled 2020-05-17: qty 1

## 2020-05-17 MED ORDER — INSULIN REGULAR HUMAN 100 UNIT/ML IJ SOLN
10.0000 [IU] | Freq: Once | INTRAMUSCULAR | Status: AC
  Administered 2020-05-17: 10 [IU] via SUBCUTANEOUS
  Filled 2020-05-17: qty 3

## 2020-05-17 MED ORDER — CLINDAMYCIN PHOSPHATE 300 MG/50ML IV SOLN: 300.0000 mg | Freq: Once | INTRAVENOUS | Status: AC

## 2020-05-17 MED ORDER — IODIXANOL 320 MG/ML IV SOLN
INTRAVENOUS | Status: DC | PRN
  Administered 2020-05-17: 20 mL

## 2020-05-17 MED ORDER — FAMOTIDINE 20 MG PO TABS: 40.0000 mg | ORAL_TABLET | Freq: Once | ORAL | Status: DC | PRN

## 2020-05-17 MED ORDER — METHYLPREDNISOLONE SODIUM SUCC 125 MG IJ SOLR
125.0000 mg | Freq: Once | INTRAMUSCULAR | Status: AC | PRN
  Administered 2020-05-17: 125 mg via INTRAVENOUS

## 2020-05-17 MED ORDER — HEPARIN SODIUM (PORCINE) 1000 UNIT/ML IJ SOLN
INTRAMUSCULAR | Status: DC | PRN
  Administered 2020-05-17: 3000 [IU] via INTRAVENOUS

## 2020-05-17 MED ORDER — FAMOTIDINE 20 MG PO TABS
ORAL_TABLET | ORAL | Status: AC
  Filled 2020-05-17: qty 2

## 2020-05-17 MED ORDER — FENTANYL CITRATE (PF) 100 MCG/2ML IJ SOLN
INTRAMUSCULAR | Status: AC
  Filled 2020-05-17: qty 2

## 2020-05-17 MED ORDER — ONDANSETRON HCL 4 MG/2ML IJ SOLN: 4.0000 mg | Freq: Four times a day (QID) | INTRAMUSCULAR | Status: DC | PRN

## 2020-05-17 MED ORDER — MIDAZOLAM HCL 2 MG/ML PO SYRP: 8.0000 mg | ORAL_SOLUTION | Freq: Once | ORAL | Status: DC | PRN

## 2020-05-17 SURGICAL SUPPLY — 10 items
BALLN LUTONIX AV 9X60X75 (BALLOONS) ×2
BALLOON LUTONIX AV 9X60X75 (BALLOONS) ×1 IMPLANT
CANNULA 5F STIFF (CANNULA) ×2 IMPLANT
COVER PROBE U/S 5X48 (MISCELLANEOUS) ×2 IMPLANT
DRAPE BRACHIAL (DRAPES) ×2 IMPLANT
GLIDEWIRE ADV .035X180CM (WIRE) ×2 IMPLANT
KIT ENCORE 26 ADVANTAGE (KITS) ×2 IMPLANT
PACK ANGIOGRAPHY (CUSTOM PROCEDURE TRAY) ×2 IMPLANT
SHEATH BRITE TIP 6FRX5.5 (SHEATH) ×2 IMPLANT
SUT MNCRL AB 4-0 PS2 18 (SUTURE) ×2 IMPLANT

## 2020-05-17 NOTE — Progress Notes (Signed)
Dr. Lucky Cowboy at bedside, speaking with pt. And his mother Constance Holster re: procedural results. Both verbalized understanding of conversation .

## 2020-05-17 NOTE — H&P (Signed)
[facility] Admission History & Physical  MRN : [id number]  [name] [name] is a 45 y.o. ([birth year]) male who presents with chief complaint of No chief complaint on file. Marland Kitchen  History of Present Illness: I am asked to evaluate the patient by the dialysis center. The patient was sent here because they were unable to achieve adequate dialysis this morning. Furthermore the Center states there is very poor thrill and bruit. The patient states there there have been increasing problems with the access, such as "pulling clots" during dialysis and prolonged bleeding after decannulation. The patient estimates these problems have been going on for several weeks. The patient is unaware of any other change.  Patient denies pain or tenderness overlying the access.  There is no pain with dialysis.  The patient denies hand pain or finger pain consistent with steal syndrome.   There have been many past interventions or declots of this access.  The patient is not chronically hypotensive on dialysis.  Current Facility-Administered Medications  Medication Dose Route Frequency Provider Last Rate Last Admin  . 0.9 %  sodium chloride infusion   Intravenous Continuous Kris Hartmann, NP 10 mL/hr at 05/17/20 0743 New Bag at 05/17/20 0743  . clindamycin (CLEOCIN) 300 MG/50ML IVPB           . clindamycin (CLEOCIN) IVPB 300 mg  300 mg Intravenous Once Kris Hartmann, NP      . diphenhydrAMINE (BENADRYL) injection 50 mg  50 mg Intravenous Once PRN Kris Hartmann, NP      . famotidine (PEPCID) tablet 40 mg  40 mg Oral Once PRN Kris Hartmann, NP      . HYDROmorphone (DILAUDID) injection 1 mg  1 mg Intravenous Once PRN Eulogio Ditch E, NP      . methylPREDNISolone sodium succinate (SOLU-MEDROL) 125 mg/2 mL injection 125 mg  125 mg Intravenous Once PRN Eulogio Ditch E, NP      . midazolam (VERSED) 2 MG/ML syrup 8 mg  8 mg Oral Once PRN Kris Hartmann, NP      . ondansetron (ZOFRAN) injection  4 mg  4 mg Intravenous Q6H PRN Kris Hartmann, NP        Past Medical History:  Diagnosis Date  . CHF (congestive heart failure) ([place])   . Coronary artery disease   . Diabetes mellitus without complication ([place])   . Dialysis patient [facility])    M-W-F  . GERD (gastroesophageal reflux disease)   . Hypertension   . Kyrle's disease   . Renal disorder    on dialysis for 4 years three times a week last was Tuesday  . Secondary hyperparathyroidism of renal origin ([place])   . Shortness of breath dyspnea   . Type 2 diabetes mellitus with diabetic nephropathy ([place])   . Type 2 diabetes mellitus with diabetic retinopathy [facility])     Past Surgical History:  Procedure Laterality Date  . A/V FISTULAGRAM Left 10/01/2017   Procedure: A/V FISTULAGRAM;  Surgeon: Algernon Huxley, MD;  Location: [place] [facility];  Service: Cardiovascular;  Laterality: Left;  . CATARACT EXTRACTION W/PHACO Right 06/30/2018   Procedure: CATARACT EXTRACTION PHACO AND INTRAOCULAR LENS PLACEMENT (IOC)-RIGHT;  Surgeon: Eulogio Bear, MD;  Location: [facility] ORS;  Service: Ophthalmology;  Laterality: Right;  [place] 00:07.0 CDE 0.16 FLUID PACK lOT # [id number]  . CATARACT EXTRACTION W/PHACO Left 12/24/2018   Procedure: CATARACT EXTRACTION PHACO AND INTRAOCULAR LENS PLACEMENT (IOC);  Surgeon: Eulogio Bear,  MD;  Location: [facility] ORS;  Service: Ophthalmology;  Laterality: Left;  [place] 00:30.0 CDE 2.01 FLUID PACK LOT # [id number]  . COLONOSCOPY WITH PROPOFOL N/A 04/23/2017   Procedure: COLONOSCOPY WITH PROPOFOL;  Surgeon: [name], MD;  Location: [facility] ENDOSCOPY;  Service: Endoscopy;  Laterality: N/A;  . COLONOSCOPY WITH PROPOFOL N/A 07/02/2017   Procedure: COLONOSCOPY WITH PROPOFOL;  Surgeon: [name], MD;  Location: [facility] ENDOSCOPY;  Service: Endoscopy;  Laterality: N/A;  . ESOPHAGOGASTRODUODENOSCOPY (EGD) WITH PROPOFOL N/A 04/23/2017   Procedure: ESOPHAGOGASTRODUODENOSCOPY (EGD) WITH PROPOFOL;  Surgeon: [name],  MD;  Location: [facility] ENDOSCOPY;  Service: Endoscopy;  Laterality: N/A;  . ESOPHAGOGASTRODUODENOSCOPY (EGD) WITH PROPOFOL N/A 03/27/2020   Procedure: ESOPHAGOGASTRODUODENOSCOPY (EGD) WITH PROPOFOL;  Surgeon: Lesly Rubenstein, MD;  Location: [facility] ENDOSCOPY;  Service: Endoscopy;  Laterality: N/A;  . EYE SURGERY     RETINA  . PERIPHERAL VASCULAR CATHETERIZATION N/A 05/07/2015   Procedure: A/V Shuntogram/Fistulagram;  Surgeon: Katha Cabal, MD;  Location: [place] [facility];  Service: Cardiovascular;  Laterality: N/A;  . PERIPHERAL VASCULAR CATHETERIZATION Left 05/07/2015   Procedure: A/V Shunt Intervention;  Surgeon: Katha Cabal, MD;  Location: [place] [facility];  Service: Cardiovascular;  Laterality: Left;  . TEE WITHOUT CARDIOVERSION N/A 07/05/2015   Procedure: TRANSESOPHAGEAL ECHOCARDIOGRAM (TEE);  Surgeon: Dionisio David, MD;  Location: [facility] ORS;  Service: Cardiovascular;  Laterality: N/A;  . UPPER EXTREMITY ANGIOGRAPHY Left 06/24/2018   Procedure: UPPER EXTREMITY ANGIOGRAPHY;  Surgeon: Algernon Huxley, MD;  Location: [place] [facility];  Service: Cardiovascular;  Laterality: Left;     Social History   Tobacco Use  . Smoking status: Never Smoker  . Smokeless tobacco: Never Used  Vaping Use  . Vaping Use: Never used  Substance Use Topics  . Alcohol use: Yes    Comment: occ. shot  . Drug use: No    Family History No family history of bleeding or clotting disorders, autoimmune disease or porphyria  Allergies  Allergen Reactions  . Cephalexin Nausea Only  . Ivp Dye [Iodinated Diagnostic Agents] Diarrhea  . Lisinopril Other (See Comments)    Pt cannot recall reaction, cough  . Other      REVIEW OF SYSTEMS (Negative unless checked)  Constitutional: '[]'$ Weight loss  '[]'$ Fever  '[]'$ Chills Cardiac: '[]'$ Chest pain   '[]'$ Chest pressure   '[]'$ Palpitations   '[]'$ Shortness of breath when laying flat   '[]'$ Shortness of breath at rest   '[x]'$ Shortness of breath with exertion. Vascular:  '[]'$ Pain  in legs with walking   '[]'$ Pain in legs at rest   '[]'$ Pain in legs when laying flat   '[]'$ Claudication   '[]'$ Pain in feet when walking  '[]'$ Pain in feet at rest  '[]'$ Pain in feet when laying flat   '[]'$ History of DVT   '[]'$ Phlebitis   '[]'$ Swelling in legs   '[]'$ Varicose veins   '[]'$ Non-healing ulcers Pulmonary:   '[]'$ Uses home oxygen   '[]'$ Productive cough   '[]'$ Hemoptysis   '[]'$ Wheeze  '[]'$ COPD   '[]'$ Asthma Neurologic:  '[]'$ Dizziness  '[]'$ Blackouts   '[]'$ Seizures   '[]'$ History of stroke   '[]'$ History of TIA  '[]'$ Aphasia   '[]'$ Temporary blindness   '[]'$ Dysphagia   '[]'$ Weakness or numbness in arms   '[]'$ Weakness or numbness in legs Musculoskeletal:  '[x]'$ Arthritis   '[]'$ Joint swelling   '[]'$ Joint pain   '[]'$ Low back pain Hematologic:  '[]'$ Easy bruising  '[]'$ Easy bleeding   '[]'$ Hypercoagulable state   '[]'$ Anemic  '[]'$ Hepatitis Gastrointestinal:  '[]'$ Blood in stool   '[]'$ Vomiting blood  '[]'$ Gastroesophageal reflux/heartburn   '[]'$   Difficulty swallowing. Genitourinary:  '[x]'$ Chronic kidney disease   '[]'$ Difficult urination  '[]'$ Frequent urination  '[]'$ Burning with urination   '[]'$ Blood in urine Skin:  '[]'$ Rashes   '[]'$ Ulcers   '[]'$ Wounds Psychological:  '[]'$ History of anxiety   '[]'$  History of major depression.  Physical Examination  Vitals:   05/17/20 0727  BP: (!) 152/94  Pulse: 79  Resp: 20  Temp: 98.7 F (37.1 C)  TempSrc: Oral  SpO2: 98%  Weight: 124.5 kg  Height: '6\' 1"'$  (1.854 m)   Body mass index is 36.21 kg/m. Gen: WD/WN, NAD Head: [place]/AT, No temporalis wasting.  Ear/Nose/Throat: Hearing grossly intact, nares w/o erythema or drainage, oropharynx w/o Erythema/Exudate,  Eyes: Conjunctiva clear, sclera non-icteric Neck: Trachea midline.  No JVD.  Pulmonary:  Good air movement, respirations not labored, no use of accessory muscles.  Cardiac: RRR, normal S1, S2. Vascular: poor thrill left arm AVF Vessel Right Left  Radial Palpable Palpable   Musculoskeletal: M/S 5/5 throughout.  Extremities without ischemic changes.  No deformity or atrophy.  Neurologic: Sensation grossly intact  in extremities.  Symmetrical.  Speech is fluent. Motor exam as listed above. Psychiatric: Judgment intact, Mood & affect appropriate for pt's clinical situation. Dermatologic: No rashes or ulcers noted.  No cellulitis or open wounds.    CBC Lab Results  Component Value Date   WBC 9.8 05/07/2015   HGB 14.6 12/24/2018   HCT 43.0 12/24/2018   MCV 83.9 05/07/2015   PLT 198 05/07/2015    BMET    Component Value Date/Time   NA 137 12/24/2018 0753   NA 138 02/24/2014 0359   K 4.0 12/24/2018 0753   K 3.5 02/24/2014 0359   CL 100 (L) 05/07/2015 0937   CL 97 (L) 02/24/2014 0359   CO2 22 05/07/2015 0937   CO2 31 02/24/2014 0359   GLUCOSE 303 (H) 12/24/2018 0753   GLUCOSE 231 (H) 02/24/2014 0359   BUN 75 (H) 05/07/2015 0937   BUN 38 (H) 02/24/2014 0359   CREATININE 17.55 (H) 05/07/2015 0937   CREATININE 12.04 (H) 02/24/2014 0359   CALCIUM 7.7 (L) 05/07/2015 0937   CALCIUM 7.9 (L) 02/24/2014 0359   GFRNONAA 3 (L) 05/07/2015 0937   GFRNONAA 5 (L) 02/24/2014 0359   GFRAA 3 (L) 05/07/2015 0937   GFRAA 6 (L) 02/24/2014 0359   CrCl cannot be calculated (Patient's most recent lab result is older than the maximum 21 days allowed.).  COAG Lab Results  Component Value Date   INR 1.28 06/28/2015   INR 1.04 05/07/2015   INR 1.0 02/22/2014    Radiology No results found.  Assessment/Plan 1.  Complication dialysis device with dysfunction of AV access:  Patient's dialysis access is malfunctioning. The patient will undergo angiography and correction of any problems using interventional techniques with the hope of restoring function to the access.  The risks and benefits were described to the patient.  All questions were answered.  The patient agrees to proceed with angiography and intervention. Potassium will be drawn to ensure that it is an appropriate level prior to performing intervention. 2.  End-stage renal disease requiring hemodialysis:  Patient will continue dialysis therapy without  further interruption if a successful intervention is not achieved then a tunneled catheter will be placed. Dialysis has already been arranged. 3.  Hypertension:  Patient will continue medical management; nephrology is following no changes in oral medications. 4.  Diabetes mellitus:  Glucose will be monitored and oral medications been held this morning once the patient has undergone  the patient's procedure po intake will be reinitiated and again Accu-Cheks will be used to assess the blood glucose level and treat as needed. The patient will be restarted on the patient's usual hypoglycemic regime 5.  Coronary artery disease:  EKG will be monitored. Nitrates will be used if needed. The patient's oral cardiac medications will be continued.    [name], MD  05/17/2020 8:16 AM

## 2020-05-17 NOTE — Op Note (Signed)
[place] [facility]    OPERATIVE NOTE   PROCEDURE: 1.   Left brachiocephalic arteriovenous fistula cannulation under ultrasound guidance 2.   Left arm fistulagram including central venogram 3.   Percutaneous transluminal angioplasty of left cephalic vein to subclavian vein confluence with 9 mm diameter by 6 cm length Lutonix drug-coated angioplasty balloon  PRE-OPERATIVE DIAGNOSIS: 1. ESRD 2. Poorly functional aneurysmal left brachiocephalic AVF  POST-OPERATIVE DIAGNOSIS: same as above   SURGEON: [name], MD  ANESTHESIA: local with MCS  ESTIMATED BLOOD LOSS: 3 cc  FINDING(S): 1. Tortuous and aneurysmal left brachiocephalic AV fistula with no stenosis in the access sites.  There is a previously placed stent in the proximal upper arm cephalic vein which was widely patent.  There were then 2 previously placed stents at the cephalic vein subclavian vein confluence with significant hyperplasia and greater than 60% stenosis.  The subclavian vein, innominate vein, and SVC were then widely patent.  SPECIMEN(S):  None  CONTRAST: 20 cc  FLUORO TIME: 1 minute  MODERATE CONSCIOUS SEDATION TIME: Approximately 21 minutes with 2 mg of Versed and 100 mcg of Fentanyl   INDICATIONS: [name] [name] is a 45 y.o. male who presents with malfunctioning aneurysmal left brachiocephalic arteriovenous fistula.  The patient is scheduled for left arm fistulagram.  The patient is aware the risks include but are not limited to: bleeding, infection, thrombosis of the cannulated access, and possible anaphylactic reaction to the contrast.  The patient is aware of the risks of the procedure and elects to proceed forward.  DESCRIPTION: After full informed written consent was obtained, the patient was brought back to the angiography suite and placed supine upon the angiography table.  The patient was connected to monitoring equipment. Moderate conscious sedation was administered with a face to  face encounter with the patient throughout the procedure with my supervision of the RN administering medicines and monitoring the patient's vital signs and mental status throughout from the start of the procedure until the patient was taken to the recovery room. The left arm was prepped and draped in the standard fashion for a percutaneous access intervention.  Under ultrasound guidance, the left brachiocephalic arteriovenous fistula was cannulated with a micropuncture needle under direct ultrasound guidance where it was patent and a permanent image was performed.  The microwire was advanced into the fistula and the needle was exchanged for the a microsheath.  I then upsized to a 6 Fr Sheath and imaging was performed.  Hand injections were completed to image the access including the central venous system. This demonstrated tortuous and aneurysmal left brachiocephalic AV fistula with no stenosis in the access sites.  There is a previously placed stent in the proximal upper arm cephalic vein which was widely patent.  There were then 2 previously placed stents at the cephalic vein subclavian vein confluence with significant hyperplasia and greater than 60% stenosis.  The subclavian vein, innominate vein, and SVC were then widely patent.  Based on the images, this patient will need intervention to the stenosis within the stents at the cephalic vein subclavian vein confluence. I then gave the patient 3000 units of intravenous heparin.  I then crossed the stenosis with an advantage wire.  Based on the imaging, a 9 mm x 6 cm  Lutonix drug coated angioplasty balloon was selected.  The balloon was centered around the cephalic vein subclavian vein confluence in stent stenosis and inflated to 10 ATM for 1 minute(s).  On completion imaging, a 15-20%  residual stenosis was present.     Based on the completion imaging, no further intervention is necessary.  The wire and balloon were removed from the sheath.  A 4-0 Monocryl  purse-string suture was sewn around the sheath.  The sheath was removed while tying down the suture.  A sterile bandage was applied to the puncture site.  COMPLICATIONS: None  CONDITION: Stable   [name]  05/17/2020 9:02 AM   This note was created with Dragon Medical transcription system. Any errors in dictation are purely unintentional.

## 2020-05-17 NOTE — Progress Notes (Signed)
Repeat BG:331. Pt. States he will take his own insulin when he gets home. Pt. Admits he is noncompliant with his meds and food. MD reminded pt. To please try and stay "on track" with his meds and that is important for his overall medical condition.

## 2020-05-17 NOTE — Progress Notes (Signed)
MD made aware of K+ level. No new orders. To proceed with procedure.

## 2020-06-13 ENCOUNTER — Other Ambulatory Visit (INDEPENDENT_AMBULATORY_CARE_PROVIDER_SITE_OTHER): Payer: Self-pay | Admitting: Vascular Surgery

## 2020-06-13 DIAGNOSIS — Z9862 Peripheral vascular angioplasty status: Secondary | ICD-10-CM

## 2020-06-13 DIAGNOSIS — N186 End stage renal disease: Secondary | ICD-10-CM

## 2020-06-14 ENCOUNTER — Ambulatory Visit (INDEPENDENT_AMBULATORY_CARE_PROVIDER_SITE_OTHER): Payer: Medicare Other

## 2020-06-14 ENCOUNTER — Ambulatory Visit (INDEPENDENT_AMBULATORY_CARE_PROVIDER_SITE_OTHER): Payer: Medicare Other | Admitting: Nurse Practitioner

## 2020-06-14 ENCOUNTER — Encounter (INDEPENDENT_AMBULATORY_CARE_PROVIDER_SITE_OTHER): Payer: Self-pay | Admitting: Nurse Practitioner

## 2020-06-14 ENCOUNTER — Other Ambulatory Visit: Payer: Self-pay

## 2020-06-14 VITALS — BP 128/83 | HR 83 | Ht 73.0 in | Wt 285.0 lb

## 2020-06-14 DIAGNOSIS — E1165 Type 2 diabetes mellitus with hyperglycemia: Secondary | ICD-10-CM | POA: Diagnosis not present

## 2020-06-14 DIAGNOSIS — Z794 Long term (current) use of insulin: Secondary | ICD-10-CM

## 2020-06-14 DIAGNOSIS — Z9862 Peripheral vascular angioplasty status: Secondary | ICD-10-CM

## 2020-06-14 DIAGNOSIS — N186 End stage renal disease: Secondary | ICD-10-CM

## 2020-06-14 DIAGNOSIS — I10 Essential (primary) hypertension: Secondary | ICD-10-CM | POA: Diagnosis not present

## 2020-06-17 ENCOUNTER — Encounter (INDEPENDENT_AMBULATORY_CARE_PROVIDER_SITE_OTHER): Payer: Self-pay | Admitting: Nurse Practitioner

## 2020-06-17 NOTE — Progress Notes (Signed)
Subjective:    Patient ID: [name] [name], male    DOB: 1976/01/05, 45 y.o.   MRN: [id number] No chief complaint on file.   The patient returns to the office for followup status post intervention of the dialysis access left brachiocephalic AV fistula. Following the intervention the access function has significantly improved, with better flow rates and improved KT/V. The patient has not been experiencing increased bleeding times following decannulation and the patient denies increased recirculation. The patient denies an increase in arm swelling. At the present time the patient denies hand pain.  Intervention on 05/17/2020 included:  PROCEDURE: 1.   Left brachiocephalic arteriovenous fistula cannulation under ultrasound guidance 2.   Left arm fistulagram including central venogram 3.   Percutaneous transluminal angioplasty of left cephalic vein to subclavian vein confluence with 9 mm diameter by 6 cm length Lutonix drug-coated angioplasty balloon  The patient denies amaurosis fugax or recent TIA symptoms. There are no recent neurological changes noted. The patient denies claudication symptoms or rest pain symptoms. The patient denies history of DVT, PE or superficial thrombophlebitis. The patient denies recent episodes of angina or shortness of breath.   The patient has a flow volume of 1388.  There are some elevated velocities near the subclavian vein.  The AV fistula is patent however there is also hematoma noted in the left mid upper extremity.      Review of Systems     Objective:   Physical Exam  BP 128/83   Pulse 83   Ht '6\' 1"'$  (1.854 m)   Wt 285 lb (129.3 kg)   BMI 37.60 kg/m   Past Medical History:  Diagnosis Date  . CHF (congestive heart failure) ([place])   . Coronary artery disease   . Diabetes mellitus without complication ([place])   . Dialysis patient [facility])    M-W-F  . GERD (gastroesophageal reflux disease)   . Hypertension   . Kyrle's disease   . Renal disorder     on dialysis for 4 years three times a week last was Tuesday  . Secondary hyperparathyroidism of renal origin ([place])   . Shortness of breath dyspnea   . Type 2 diabetes mellitus with diabetic nephropathy ([place])   . Type 2 diabetes mellitus with diabetic retinopathy ([place])     Social History   Socioeconomic History  . Marital status: Single    Spouse name: Not on file  . Number of children: Not on file  . Years of education: Not on file  . Highest education level: Not on file  Occupational History  . Not on file  Tobacco Use  . Smoking status: Never Smoker  . Smokeless tobacco: Never Used  Vaping Use  . Vaping Use: Never used  Substance and Sexual Activity  . Alcohol use: Yes    Comment: occ. shot  . Drug use: No  . Sexual activity: Not on file  Other Topics Concern  . Not on file  Social History Narrative   Lives by himself   Social Determinants of Health   Financial Resource Strain: Not on file  Food Insecurity: Not on file  Transportation Needs: Not on file  Physical Activity: Not on file  Stress: Not on file  Social Connections: Not on file  Intimate Partner Violence: Not on file    Past Surgical History:  Procedure Laterality Date  . A/V FISTULAGRAM Left 10/01/2017   Procedure: A/V FISTULAGRAM;  Surgeon: Algernon Huxley, MD;  Location: [place] [facility];  Service: Cardiovascular;  Laterality: Left;  . A/V FISTULAGRAM Left 05/17/2020   Procedure: A/V FISTULAGRAM;  Surgeon: Algernon Huxley, MD;  Location: [place] [facility];  Service: Cardiovascular;  Laterality: Left;  . CATARACT EXTRACTION W/PHACO Right 06/30/2018   Procedure: CATARACT EXTRACTION PHACO AND INTRAOCULAR LENS PLACEMENT (IOC)-RIGHT;  Surgeon: Eulogio Bear, MD;  Location: [facility] ORS;  Service: Ophthalmology;  Laterality: Right;  [place] 00:07.0 CDE 0.16 FLUID PACK lOT # [id number]  . CATARACT EXTRACTION W/PHACO Left 12/24/2018   Procedure: CATARACT EXTRACTION PHACO AND INTRAOCULAR LENS PLACEMENT (IOC);   Surgeon: Eulogio Bear, MD;  Location: [facility] ORS;  Service: Ophthalmology;  Laterality: Left;  [place] 00:30.0 CDE 2.01 FLUID PACK LOT # [id number]  . COLONOSCOPY WITH PROPOFOL N/A 04/23/2017   Procedure: COLONOSCOPY WITH PROPOFOL;  Surgeon: [name], MD;  Location: [facility] ENDOSCOPY;  Service: Endoscopy;  Laterality: N/A;  . COLONOSCOPY WITH PROPOFOL N/A 07/02/2017   Procedure: COLONOSCOPY WITH PROPOFOL;  Surgeon: [name], MD;  Location: [facility] ENDOSCOPY;  Service: Endoscopy;  Laterality: N/A;  . ESOPHAGOGASTRODUODENOSCOPY (EGD) WITH PROPOFOL N/A 04/23/2017   Procedure: ESOPHAGOGASTRODUODENOSCOPY (EGD) WITH PROPOFOL;  Surgeon: [name], MD;  Location: [facility] ENDOSCOPY;  Service: Endoscopy;  Laterality: N/A;  . ESOPHAGOGASTRODUODENOSCOPY (EGD) WITH PROPOFOL N/A 03/27/2020   Procedure: ESOPHAGOGASTRODUODENOSCOPY (EGD) WITH PROPOFOL;  Surgeon: Lesly Rubenstein, MD;  Location: [facility] ENDOSCOPY;  Service: Endoscopy;  Laterality: N/A;  . EYE SURGERY     RETINA  . PERIPHERAL VASCULAR CATHETERIZATION N/A 05/07/2015   Procedure: A/V Shuntogram/Fistulagram;  Surgeon: Katha Cabal, MD;  Location: [place] [facility];  Service: Cardiovascular;  Laterality: N/A;  . PERIPHERAL VASCULAR CATHETERIZATION Left 05/07/2015   Procedure: A/V Shunt Intervention;  Surgeon: Katha Cabal, MD;  Location: [place] [facility];  Service: Cardiovascular;  Laterality: Left;  . TEE WITHOUT CARDIOVERSION N/A 07/05/2015   Procedure: TRANSESOPHAGEAL ECHOCARDIOGRAM (TEE);  Surgeon: Dionisio David, MD;  Location: [facility] ORS;  Service: Cardiovascular;  Laterality: N/A;  . UPPER EXTREMITY ANGIOGRAPHY Left 06/24/2018   Procedure: UPPER EXTREMITY ANGIOGRAPHY;  Surgeon: Algernon Huxley, MD;  Location: [place] [facility];  Service: Cardiovascular;  Laterality: Left;    History reviewed. No pertinent family history.  Allergies  Allergen Reactions  . Cephalexin Nausea Only  . Ivp Dye [Iodinated Diagnostic  Agents] Diarrhea  . Lisinopril Other (See Comments)    Pt cannot recall reaction, cough  . Other     CBC Latest Ref Rng & Units 12/24/2018 06/30/2018 05/07/2015  WBC 3.8 - 10.6 K/uL - - 9.8  Hemoglobin 13.0 - 17.0 g/dL 14.6 13.6 11.4(L)  Hematocrit 39.0 - 52.0 % 43.0 40.0 34.8(L)  Platelets 150 - 440 K/uL - - 198      CMP     Component Value Date/Time   NA 137 12/24/2018 0753   NA 138 02/24/2014 0359   K 4.0 12/24/2018 0753   K 3.5 02/24/2014 0359   CL 100 (L) 05/07/2015 0937   CL 97 (L) 02/24/2014 0359   CO2 22 05/07/2015 0937   CO2 31 02/24/2014 0359   GLUCOSE 303 (H) 12/24/2018 0753   GLUCOSE 231 (H) 02/24/2014 0359   BUN 75 (H) 05/07/2015 0937   BUN 38 (H) 02/24/2014 0359   CREATININE 17.55 (H) 05/07/2015 0937   CREATININE 12.04 (H) 02/24/2014 0359   CALCIUM 7.7 (L) 05/07/2015 0937   CALCIUM 7.9 (L) 02/24/2014 0359   PROT 7.9 05/07/2015 0937   PROT 7.3 02/22/2014 1526  ALBUMIN 4.0 05/07/2015 0937   ALBUMIN 3.2 (L) 02/22/2014 1526   AST 14 (L) 05/07/2015 0937   AST 27 02/22/2014 1526   ALT 13 (L) 05/07/2015 0937   ALT 17 02/22/2014 1526   ALKPHOS 77 05/07/2015 0937   ALKPHOS 73 02/22/2014 1526   BILITOT 0.5 05/07/2015 0937   BILITOT 0.8 02/22/2014 1526   GFRNONAA 3 (L) 05/07/2015 0937   GFRNONAA 5 (L) 02/24/2014 0359   GFRAA 3 (L) 05/07/2015 0937   GFRAA 6 (L) 02/24/2014 0359     No results found.     Assessment & Plan:   1. ESRD (end stage renal disease) ([place]) Recommend:  The patient is doing well and currently has adequate dialysis access. The patient's dialysis center is not reporting any access issues. Flow pattern is stable when compared to the prior ultrasound.  Due to elevations within the subclavian vein as well as hematoma present in the upper extremity.  The patient should have a duplex ultrasound of the dialysis access in 3 months. The patient will follow-up with me in the office after each ultrasound     2. Primary  hypertension Continue antihypertensive medications as already ordered, these medications have been reviewed and there are no changes at this time.   3. Uncontrolled type 2 diabetes mellitus with hyperglycemia, with long-term current use of insulin (HCC) Continue hypoglycemic medications as already ordered, these medications have been reviewed and there are no changes at this time.  Hgb A1C to be monitored as already arranged by primary service    Current Outpatient Medications on File Prior to Visit  Medication Sig Dispense Refill  . aspirin EC 81 MG tablet Take 81 mg by mouth daily at 6 PM. (1700)    . calcium acetate (PHOSLO) 667 MG capsule Take by mouth in the morning and at bedtime. Takes in AM    . carvedilol (COREG) 12.5 MG tablet Take 12.5 mg by mouth 2 (two) times daily with a meal.     . HUMULIN 70/30 (70-30) 100 UNIT/ML injection Inject 10-30 Units into the skin See admin instructions. 10 units before breakfast and 30 units before supper ON DIALYSIS DAYS (M-W-F), NO AM INSULIN    . HYDROcodone-acetaminophen (NORCO/VICODIN) 5-325 MG tablet Take 1 tablet by mouth every 6 (six) hours as needed for moderate pain. 15 tablet 0  . lanthanum (FOSRENOL) 1000 MG chewable tablet Chew 2,000 mg by mouth 3 (three) times daily with meals. Takes as a powder    . loperamide (IMODIUM) 2 MG capsule Take 2 mg by mouth as needed for diarrhea or loose stools (Take 1/2-1 tablet as meeded to prevent diarrhea. Can take up to 1 tablet every 4 hours. Do not exceed 4 tablets in a day.).    Marland Kitchen losartan (COZAAR) 50 MG tablet Take 50 mg by mouth daily. (1700)    . omeprazole (PRILOSEC) 20 MG capsule Take by mouth.    . SENSIPAR 60 MG tablet Take 60 mg by mouth daily at 6 PM. 1700  3  . sevelamer carbonate (RENVELA) 2.4 g PACK      No current facility-administered medications on file prior to visit.    There are no Patient Instructions on file for this visit. No follow-ups on file.   Kris Hartmann,  NP

## 2020-07-16 ENCOUNTER — Other Ambulatory Visit
Admission: RE | Admit: 2020-07-16 | Discharge: 2020-07-16 | Disposition: A | Discharge status: Home / Self Care | Payer: Medicare Other | Source: Ambulatory Visit | Attending: Vascular Surgery | Admitting: Vascular Surgery

## 2020-07-16 ENCOUNTER — Other Ambulatory Visit: Payer: Self-pay

## 2020-07-16 ENCOUNTER — Encounter: Payer: Self-pay | Admitting: Vascular Surgery

## 2020-07-16 ENCOUNTER — Telehealth (INDEPENDENT_AMBULATORY_CARE_PROVIDER_SITE_OTHER): Payer: Self-pay | Admitting: Vascular Surgery

## 2020-07-16 ENCOUNTER — Other Ambulatory Visit (INDEPENDENT_AMBULATORY_CARE_PROVIDER_SITE_OTHER): Payer: Self-pay | Admitting: Nurse Practitioner

## 2020-07-16 ENCOUNTER — Telehealth (INDEPENDENT_AMBULATORY_CARE_PROVIDER_SITE_OTHER): Payer: Self-pay

## 2020-07-16 ENCOUNTER — Ambulatory Visit
Admission: RE | Admit: 2020-07-16 | Discharge: 2020-07-16 | Disposition: A | Discharge status: Home / Self Care | Payer: Medicare Other | Source: Ambulatory Visit | Attending: Vascular Surgery | Admitting: Vascular Surgery

## 2020-07-16 ENCOUNTER — Encounter
Admission: RE | Disposition: A | Discharge status: Home / Self Care | Payer: Self-pay | Source: Ambulatory Visit | Attending: Vascular Surgery

## 2020-07-16 DIAGNOSIS — Z992 Dependence on renal dialysis: Secondary | ICD-10-CM | POA: Diagnosis not present

## 2020-07-16 DIAGNOSIS — T82858A Stenosis of vascular prosthetic devices, implants and grafts, initial encounter: Secondary | ICD-10-CM | POA: Diagnosis present

## 2020-07-16 DIAGNOSIS — Y841 Kidney dialysis as the cause of abnormal reaction of the patient, or of later complication, without mention of misadventure at the time of the procedure: Secondary | ICD-10-CM | POA: Diagnosis not present

## 2020-07-16 DIAGNOSIS — Z20822 Contact with and (suspected) exposure to covid-19: Secondary | ICD-10-CM | POA: Diagnosis not present

## 2020-07-16 DIAGNOSIS — E1122 Type 2 diabetes mellitus with diabetic chronic kidney disease: Secondary | ICD-10-CM | POA: Diagnosis not present

## 2020-07-16 DIAGNOSIS — Z888 Allergy status to other drugs, medicaments and biological substances status: Secondary | ICD-10-CM | POA: Insufficient documentation

## 2020-07-16 DIAGNOSIS — I132 Hypertensive heart and chronic kidney disease with heart failure and with stage 5 chronic kidney disease, or end stage renal disease: Secondary | ICD-10-CM | POA: Diagnosis not present

## 2020-07-16 DIAGNOSIS — Z881 Allergy status to other antibiotic agents status: Secondary | ICD-10-CM | POA: Diagnosis not present

## 2020-07-16 DIAGNOSIS — N186 End stage renal disease: Secondary | ICD-10-CM | POA: Insufficient documentation

## 2020-07-16 DIAGNOSIS — T82898A Other specified complication of vascular prosthetic devices, implants and grafts, initial encounter: Secondary | ICD-10-CM | POA: Diagnosis not present

## 2020-07-16 DIAGNOSIS — I509 Heart failure, unspecified: Secondary | ICD-10-CM | POA: Diagnosis not present

## 2020-07-16 HISTORY — PX: PERIPHERAL VASCULAR THROMBECTOMY: CATH118306

## 2020-07-16 LAB — SARS CORONAVIRUS 2 BY RT PCR (HOSPITAL ORDER, PERFORMED IN ~~LOC~~ HOSPITAL LAB): SARS Coronavirus 2: NEGATIVE

## 2020-07-16 LAB — GLUCOSE, CAPILLARY
Glucose-Capillary: 156 mg/dL — ABNORMAL HIGH (ref 70–99)
Glucose-Capillary: 156 mg/dL — ABNORMAL HIGH (ref 70–99)

## 2020-07-16 LAB — POTASSIUM (ARMC VASCULAR LAB ONLY): Potassium (ARMC vascular lab): 5.3 — ABNORMAL HIGH (ref 3.5–5.1)

## 2020-07-16 SURGERY — PERIPHERAL VASCULAR THROMBECTOMY
Anesthesia: Moderate Sedation | Laterality: Left

## 2020-07-16 MED ORDER — DIPHENHYDRAMINE HCL 50 MG/ML IJ SOLN: 50.0000 mg | Freq: Once | INTRAMUSCULAR | Status: AC | PRN

## 2020-07-16 MED ORDER — CLINDAMYCIN PHOSPHATE 300 MG/50ML IV SOLN
INTRAVENOUS | Status: AC
  Filled 2020-07-16: qty 50

## 2020-07-16 MED ORDER — FAMOTIDINE 20 MG PO TABS: 40.0000 mg | ORAL_TABLET | Freq: Once | ORAL | Status: AC | PRN

## 2020-07-16 MED ORDER — METHYLPREDNISOLONE SODIUM SUCC 125 MG IJ SOLR
INTRAMUSCULAR | Status: AC
  Administered 2020-07-16: 125 mg via INTRAVENOUS
  Filled 2020-07-16: qty 2

## 2020-07-16 MED ORDER — BACITRACIN-NEOMYCIN-POLYMYXIN 400-5-5000 EX OINT
TOPICAL_OINTMENT | CUTANEOUS | Status: AC
  Filled 2020-07-16: qty 1

## 2020-07-16 MED ORDER — SODIUM CHLORIDE 0.9 % IV SOLN: INTRAVENOUS | Status: DC

## 2020-07-16 MED ORDER — HYDROMORPHONE HCL 1 MG/ML IJ SOLN: 1.0000 mg | Freq: Once | INTRAMUSCULAR | Status: DC | PRN

## 2020-07-16 MED ORDER — MIDAZOLAM HCL 5 MG/5ML IJ SOLN
INTRAMUSCULAR | Status: AC
  Filled 2020-07-16: qty 5

## 2020-07-16 MED ORDER — METHYLPREDNISOLONE SODIUM SUCC 125 MG IJ SOLR: 125.0000 mg | Freq: Once | INTRAMUSCULAR | Status: AC | PRN

## 2020-07-16 MED ORDER — FENTANYL CITRATE (PF) 100 MCG/2ML IJ SOLN
INTRAMUSCULAR | Status: AC
  Filled 2020-07-16: qty 2

## 2020-07-16 MED ORDER — CLINDAMYCIN PHOSPHATE 300 MG/50ML IV SOLN
INTRAVENOUS | Status: AC
  Administered 2020-07-16: 300 mg via INTRAVENOUS
  Filled 2020-07-16: qty 50

## 2020-07-16 MED ORDER — HEPARIN SODIUM (PORCINE) 1000 UNIT/ML IJ SOLN
INTRAMUSCULAR | Status: AC
  Filled 2020-07-16: qty 1

## 2020-07-16 MED ORDER — ONDANSETRON HCL 4 MG/2ML IJ SOLN: 4.0000 mg | Freq: Four times a day (QID) | INTRAMUSCULAR | Status: DC | PRN

## 2020-07-16 MED ORDER — MIDAZOLAM HCL 2 MG/2ML IJ SOLN
INTRAMUSCULAR | Status: DC | PRN
  Administered 2020-07-16: 1 mg via INTRAVENOUS
  Administered 2020-07-16: 2 mg via INTRAVENOUS

## 2020-07-16 MED ORDER — DIPHENHYDRAMINE HCL 50 MG/ML IJ SOLN
INTRAMUSCULAR | Status: AC
  Administered 2020-07-16: 50 mg via INTRAVENOUS
  Filled 2020-07-16: qty 1

## 2020-07-16 MED ORDER — CLINDAMYCIN PHOSPHATE 300 MG/50ML IV SOLN: 300.0000 mg | Freq: Once | INTRAVENOUS | Status: AC

## 2020-07-16 MED ORDER — MIDAZOLAM HCL 2 MG/ML PO SYRP: 8.0000 mg | ORAL_SOLUTION | Freq: Once | ORAL | Status: DC | PRN

## 2020-07-16 MED ORDER — HEPARIN SODIUM (PORCINE) 1000 UNIT/ML IJ SOLN
INTRAMUSCULAR | Status: DC | PRN
  Administered 2020-07-16: 3000 [IU] via INTRAVENOUS

## 2020-07-16 MED ORDER — FAMOTIDINE 20 MG PO TABS
ORAL_TABLET | ORAL | Status: AC
  Administered 2020-07-16: 40 mg via ORAL
  Filled 2020-07-16: qty 2

## 2020-07-16 MED ORDER — IODIXANOL 320 MG/ML IV SOLN
INTRAVENOUS | Status: DC | PRN
  Administered 2020-07-16: 35 mL

## 2020-07-16 MED ORDER — FENTANYL CITRATE (PF) 100 MCG/2ML IJ SOLN
INTRAMUSCULAR | Status: DC | PRN
  Administered 2020-07-16: 50 ug via INTRAVENOUS
  Administered 2020-07-16: 25 ug via INTRAVENOUS

## 2020-07-16 SURGICAL SUPPLY — 12 items
BALLN DORADO 10X40X80 (BALLOONS) ×2
BALLN LUTONIX AV 8X60X75 (BALLOONS) ×2
BALLOON DORADO 10X40X80 (BALLOONS) ×1 IMPLANT
BALLOON LUTONIX AV 8X60X75 (BALLOONS) ×1 IMPLANT
CANNULA 5F STIFF (CANNULA) ×2 IMPLANT
COVER PROBE U/S 5X48 (MISCELLANEOUS) ×2 IMPLANT
DRAPE BRACHIAL (DRAPES) ×2 IMPLANT
GLIDEWIRE ADV .035X180CM (WIRE) ×2 IMPLANT
KIT ENCORE 26 ADVANTAGE (KITS) ×2 IMPLANT
PACK ANGIOGRAPHY (CUSTOM PROCEDURE TRAY) ×2 IMPLANT
SHEATH BRITE TIP 6FRX5.5 (SHEATH) ×2 IMPLANT
SUT MNCRL AB 4-0 PS2 18 (SUTURE) ×2 IMPLANT

## 2020-07-16 NOTE — Telephone Encounter (Signed)
Patient has been schedule.

## 2020-07-16 NOTE — Telephone Encounter (Signed)
I called the pt and made him aware he needs to head over to the hospital for his Covid test and declot of his fistula.

## 2020-07-16 NOTE — Telephone Encounter (Signed)
Called stating that she sent over documentation for patient that his access is clotted and wanted to know if he could be seen today. She would like a callback if he cannot so that she can try to get him seen elsewhere. Phone number attached to phone note.

## 2020-07-16 NOTE — Telephone Encounter (Signed)
Checked fax machines no documentation for patient. Contacted facility to re-fax.

## 2020-07-16 NOTE — Discharge Instructions (Signed)
Dialysis Fistulogram  A dialysis fistulogram is an X-ray test to look inside the vascular access site. This is the area where blood is removed and returned to your body during dialysis. Fistulas and grafts provide easy and effective access for dialysis. However, sometimes problems can occur. The fistula or graft can become clogged or narrow. This can make dialysis less effective. You may need to have a fistulogram to check for these problems. Tell a health care provider about:  Any allergies you have, including any reactions you have had to contrast dye.  All medicines you are taking, including vitamins, herbs, eye drops, creams, and over-the-counter medicines.  Any problems you or family members have had with anesthetic medicines.  Any blood disorders you have.  Any surgeries you have had.  Any medical conditions you have.  Whether you are pregnant or may be pregnant.  Any warmth, pain, redness, or swelling in the limb that has the fistula or graft.  Any bleeding from your fistula or graft.  Any of the following in the area where the fistula or graft leads: ? Numbness. ? Tingling. ? A cold feeling. ? Bluish or pale white color. What are the risks? Generally, this is a safe procedure. However, problems may occur, including:  Infection.  Bleeding.  Allergic reactions to medicines or dyes.  Damage to other structures, such as nearby nerves or blood vessels.  A blood clot in the fistula or graft.  A blood clot that travels to your lungs (pulmonary embolism). What happens before the procedure? Medicines Ask your health care provider about:  Changing or stopping your regular medicines. This is especially important if you are taking diabetes medicines or blood thinners (anticoagulants).  Taking medicines such as aspirin and ibuprofen. These medicines can thin your blood. Do not take these medicines unless your health care provider tells you to take them.  Taking  over-the-counter medicines, vitamins, herbs, and supplements. General instructions  Follow instructions from your health care provider about eating and drinking restrictions.  Plan to have a responsible adult take you home from the hospital or clinic.  Plan to have a responsible adult care for you for the time you are told after you leave the hospital or clinic. This is important.  You may have a blood or urine sample taken. These will help your health care provider learn how well your kidneys and liver are working and how well your blood clots.  Ask your health care provider what steps will be taken to help prevent infection. What happens during the procedure?  An IV will be inserted into one of your veins.  You may be connected to monitors that track your heart rate, blood pressure, oxygen level, and pulse.  You will be given one or more of the following: ? A medicine to help you relax (sedative). ? A medicine to numb the area (local anesthetic) on the limb with your fistula or graft.  The area of your body where the catheter is to be inserted will be sterilized and covered with a surgical drape.  The health care provider will make a very small incision at the vascular access site.  A sheath or short tube will be inserted into the fistula or graft.  The catheter will be inserted through the sheath and advanced until it reaches the area of possible problems.  Dye, also called contrast material,will be injected into the catheter. An angiogram or X-rays will be taken to help identify the site of the blockage.  If a blockage or narrowing is found, your health care provider can do treatments during the procedure to clear the problem. Treatments to open a blocked or narrowed fistula or graft may include: ? Using a device to remove the clot from the fistula or graft. ? Using a balloon (angioplasty) to open or widen the blocked area. ? Placing a small mesh tube (stent) to keep the  fistula or graft open. ? Injecting a medicine to dissolve the clot (thrombolysis).  When the procedure is complete, the catheter will be removed and pressure will be applied to stop any bleeding.  In some cases, the catheter site will be closed with stitches (sutures) underneath the skin.  Your skin will be covered with a bandage (dressing) and the IV will be removed. The procedure may vary among health care providers and hospitals. What happens after the procedure?  Your blood pressure, heart rate, breathing rate, and blood oxygen level will be monitored until you leave the hospital or clinic. Your health care provider will also monitor your vascular access site.  You can expect to wait in a recovery area for several hours.  If you were given a sedative during the procedure, it can affect you for several hours. Do not drive or operate machinery until your health care provider says that it is safe. Summary  A dialysis fistulogram is an X-ray test to look inside the vascular access site. This is the area where blood is removed and returned to your body during dialysis.  Before the procedure, ask your health care provider if you should change or stop any medicines you are taking.  If a problem is found during a fistulogram, your health care provider can also do treatments to clear the blocked or narrowed areas. This information is not intended to replace advice given to you by your health care provider. Make sure you discuss any questions you have with your health care provider. Document Revised: 11/23/2019 Document Reviewed: 11/23/2019 Elsevier Patient Education  [place].

## 2020-07-16 NOTE — H&P (Signed)
[facility] Admission History & Physical  MRN : [id number]  [name] [name] is a 45 y.o. (January 27, 1976) male who presents with chief complaint of scheduled upper extremity dialysis access declot.  History of Present Illness:  I am asked to evaluate the patient by the dialysis center. The patient was sent here because they were unable to cannulate his upper extremity dialysis access this morning. Furthermore the Center states there is no thrill or bruit. The patient states this is the first dialysis run to be missed. This problem is acute in onset and has been present for approximately one days. The patient is unaware of any other change.  Patient denies pain or tenderness overlying the access.  There is no pain with dialysis.  The patient denies hand pain or finger pain consistent with steal syndrome.   Current Facility-Administered Medications  Medication Dose Route Frequency Provider Last Rate Last Admin  . 0.9 %  sodium chloride infusion   Intravenous Continuous Eulogio Ditch E, NP      . clindamycin (CLEOCIN) 300 MG/50ML IVPB           . clindamycin (CLEOCIN) IVPB 300 mg  300 mg Intravenous Once Eulogio Ditch E, NP      . diphenhydrAMINE (BENADRYL) injection 50 mg  50 mg Intravenous Once PRN Kris Hartmann, NP      . famotidine (PEPCID) tablet 40 mg  40 mg Oral Once PRN Kris Hartmann, NP      . fentaNYL (SUBLIMAZE) 100 MCG/2ML injection           . heparin sodium (porcine) 1000 UNIT/ML injection           . HYDROmorphone (DILAUDID) injection 1 mg  1 mg Intravenous Once PRN Eulogio Ditch E, NP      . methylPREDNISolone sodium succinate (SOLU-MEDROL) 125 mg/2 mL injection 125 mg  125 mg Intravenous Once PRN Eulogio Ditch E, NP      . midazolam (VERSED) 2 MG/ML syrup 8 mg  8 mg Oral Once PRN Kris Hartmann, NP      . midazolam (VERSED) 5 MG/5ML injection           . ondansetron (ZOFRAN) injection 4 mg  4 mg Intravenous Q6H PRN Kris Hartmann, NP       Past  Medical History:  Diagnosis Date  . CHF (congestive heart failure) ([place])   . Coronary artery disease   . Diabetes mellitus without complication ([place])   . Dialysis patient [facility])    M-W-F  . GERD (gastroesophageal reflux disease)   . Hypertension   . Kyrle's disease   . Renal disorder    on dialysis for 4 years three times a week last was Tuesday  . Secondary hyperparathyroidism of renal origin ([place])   . Shortness of breath dyspnea   . Type 2 diabetes mellitus with diabetic nephropathy ([place])   . Type 2 diabetes mellitus with diabetic retinopathy [facility])    Past Surgical History:  Procedure Laterality Date  . A/V FISTULAGRAM Left 10/01/2017   Procedure: A/V FISTULAGRAM;  Surgeon: Algernon Huxley, MD;  Location: [place] [facility];  Service: Cardiovascular;  Laterality: Left;  . A/V FISTULAGRAM Left 05/17/2020   Procedure: A/V FISTULAGRAM;  Surgeon: Algernon Huxley, MD;  Location: [place] [facility];  Service: Cardiovascular;  Laterality: Left;  . CATARACT EXTRACTION W/PHACO Right 06/30/2018   Procedure: CATARACT EXTRACTION PHACO AND INTRAOCULAR LENS PLACEMENT (IOC)-RIGHT;  Surgeon: Eulogio Bear, MD;  Location: [facility] ORS;  Service: Ophthalmology;  Laterality: Right;  [place] 00:07.0 CDE 0.16 FLUID PACK lOT # [id number]  . CATARACT EXTRACTION W/PHACO Left 12/24/2018   Procedure: CATARACT EXTRACTION PHACO AND INTRAOCULAR LENS PLACEMENT (IOC);  Surgeon: Eulogio Bear, MD;  Location: [facility] ORS;  Service: Ophthalmology;  Laterality: Left;  [place] 00:30.0 CDE 2.01 FLUID PACK LOT # [id number]  . COLONOSCOPY WITH PROPOFOL N/A 04/23/2017   Procedure: COLONOSCOPY WITH PROPOFOL;  Surgeon: [name], MD;  Location[facility] ENDOSCOPY;  Service: Endoscopy;  Laterality: N/A;  . COLONOSCOPY WITH PROPOFOL N/A 07/02/2017   Procedure: COLONOSCOPY WITH PROPOFOL;  Surgeon: [name], MD;  Location: [facility] ENDOSCOPY;  Service: Endoscopy;  Laterality: N/A;  . ESOPHAGOGASTRODUODENOSCOPY (EGD) WITH PROPOFOL N/A  04/23/2017   Procedure: ESOPHAGOGASTRODUODENOSCOPY (EGD) WITH PROPOFOL;  Surgeon: [name], MD;  Location: [facility] ENDOSCOPY;  Service: Endoscopy;  Laterality: N/A;  . ESOPHAGOGASTRODUODENOSCOPY (EGD) WITH PROPOFOL N/A 03/27/2020   Procedure: ESOPHAGOGASTRODUODENOSCOPY (EGD) WITH PROPOFOL;  Surgeon: Lesly Rubenstein, MD;  Location: [facility] ENDOSCOPY;  Service: Endoscopy;  Laterality: N/A;  . EYE SURGERY     RETINA  . PERIPHERAL VASCULAR CATHETERIZATION N/A 05/07/2015   Procedure: A/V Shuntogram/Fistulagram;  Surgeon: Katha Cabal, MD;  Location: [place] [facility];  Service: Cardiovascular;  Laterality: N/A;  . PERIPHERAL VASCULAR CATHETERIZATION Left 05/07/2015   Procedure: A/V Shunt Intervention;  Surgeon: Katha Cabal, MD;  Location: [place] [facility];  Service: Cardiovascular;  Laterality: Left;  . TEE WITHOUT CARDIOVERSION N/A 07/05/2015   Procedure: TRANSESOPHAGEAL ECHOCARDIOGRAM (TEE);  Surgeon: Dionisio David, MD;  Location: [facility] ORS;  Service: Cardiovascular;  Laterality: N/A;  . UPPER EXTREMITY ANGIOGRAPHY Left 06/24/2018   Procedure: UPPER EXTREMITY ANGIOGRAPHY;  Surgeon: Algernon Huxley, MD;  Location: [place] [facility];  Service: Cardiovascular;  Laterality: Left;   Social History Social History   Tobacco Use  . Smoking status: Never Smoker  . Smokeless tobacco: Never Used  Vaping Use  . Vaping Use: Never used  Substance Use Topics  . Alcohol use: Yes    Comment: occ. shot  . Drug use: No   Family History History reviewed. No pertinent family history.   No family history of bleeding or clotting disorders, autoimmune disease or porphyria.  Allergies  Allergen Reactions  . Cephalexin Nausea Only  . Ivp Dye [Iodinated Diagnostic Agents] Diarrhea  . Lisinopril Other (See Comments)    Pt cannot recall reaction, cough  . Other    REVIEW OF SYSTEMS (Negative unless checked)  Constitutional: '[]'$ Weight loss  '[]'$ Fever  '[]'$ Chills Cardiac: '[]'$ Chest pain    '[]'$ Chest pressure   '[]'$ Palpitations   '[]'$ Shortness of breath when laying flat   '[]'$ Shortness of breath at rest   '[x]'$ Shortness of breath with exertion. Vascular:  '[]'$ Pain in legs with walking   '[]'$ Pain in legs at rest   '[]'$ Pain in legs when laying flat   '[]'$ Claudication   '[]'$ Pain in feet when walking  '[]'$ Pain in feet at rest  '[]'$ Pain in feet when laying flat   '[]'$ History of DVT   '[]'$ Phlebitis   '[]'$ Swelling in legs   '[]'$ Varicose veins   '[]'$ Non-healing ulcers Pulmonary:   '[]'$ Uses home oxygen   '[]'$ Productive cough   '[]'$ Hemoptysis   '[]'$ Wheeze  '[]'$ COPD   '[]'$ Asthma Neurologic:  '[]'$ Dizziness  '[]'$ Blackouts   '[]'$ Seizures   '[]'$ History of stroke   '[]'$ History of TIA  '[]'$ Aphasia   '[]'$ Temporary blindness   '[]'$ Dysphagia   '[]'$ Weakness or numbness in arms   '[]'$ Weakness or numbness  in legs Musculoskeletal:  '[]'$ Arthritis   '[]'$ Joint swelling   '[]'$ Joint pain   '[]'$ Low back pain Hematologic:  '[]'$ Easy bruising  '[]'$ Easy bleeding   '[]'$ Hypercoagulable state   '[]'$ Anemic  '[]'$ Hepatitis Gastrointestinal:  '[]'$ Blood in stool   '[]'$ Vomiting blood  '[]'$ Gastroesophageal reflux/heartburn   '[]'$ Difficulty swallowing. Genitourinary:  '[x]'$ Chronic kidney disease   '[]'$ Difficult urination  '[]'$ Frequent urination  '[]'$ Burning with urination   '[]'$ Blood in urine Skin:  '[]'$ Rashes   '[]'$ Ulcers   '[]'$ Wounds Psychological:  '[]'$ History of anxiety   '[]'$  History of major depression.  Physical Examination  Vitals:   07/16/20 1414  BP: (!) 144/81  Pulse: 82  Resp: 18  Temp: 98.6 F (37 C)  TempSrc: Oral  SpO2: 97%  Weight: 130.6 kg  Height: '6\' 1"'$  (1.854 m)   Body mass index is 37.99 kg/m. Gen: WD/WN, NAD Head: [place]/AT, No temporalis wasting. Prominent temp pulse not noted. Ear/Nose/Throat: Hearing grossly intact, nares w/o erythema or drainage, oropharynx w/o Erythema/Exudate,  Eyes: Conjunctiva clear, sclera non-icteric Neck: Trachea midline.  No JVD.  Pulmonary:  Good air movement, respirations not labored, no use of accessory muscles.  Cardiac: RRR, normal S1, S2. Vascular:  Vessel Right Left   Radial Palpable Palpable  Ulnar Not Palpable Not Palpable  Brachial Palpable Palpable  Carotid Palpable, without bruit Palpable, without bruit   Left Upper Extremity: Dialysis access without bruit or thrill.  There is no acute vascular compromise to extremity at this time.  Gastrointestinal: soft, non-tender/non-distended. No guarding/reflex.  Musculoskeletal: M/S 5/5 throughout.  Extremities without ischemic changes.  No deformity or atrophy.  Neurologic: Sensation grossly intact in extremities.  Symmetrical.  Speech is fluent. Motor exam as listed above. Psychiatric: Judgment intact, Mood & affect appropriate for pt's clinical situation. Dermatologic: No rashes or ulcers noted.  No cellulitis or open wounds. Lymph : No Cervical, Axillary, or Inguinal lymphadenopathy.  CBC Lab Results  Component Value Date   WBC 9.8 05/07/2015   HGB 14.6 12/24/2018   HCT 43.0 12/24/2018   MCV 83.9 05/07/2015   PLT 198 05/07/2015   BMET    Component Value Date/Time   NA 137 12/24/2018 0753   NA 138 02/24/2014 0359   K 4.0 12/24/2018 0753   K 3.5 02/24/2014 0359   CL 100 (L) 05/07/2015 0937   CL 97 (L) 02/24/2014 0359   CO2 22 05/07/2015 0937   CO2 31 02/24/2014 0359   GLUCOSE 303 (H) 12/24/2018 0753   GLUCOSE 231 (H) 02/24/2014 0359   BUN 75 (H) 05/07/2015 0937   BUN 38 (H) 02/24/2014 0359   CREATININE 17.55 (H) 05/07/2015 0937   CREATININE 12.04 (H) 02/24/2014 0359   CALCIUM 7.7 (L) 05/07/2015 0937   CALCIUM 7.9 (L) 02/24/2014 0359   GFRNONAA 3 (L) 05/07/2015 0937   GFRNONAA 5 (L) 02/24/2014 0359   GFRAA 3 (L) 05/07/2015 0937   GFRAA 6 (L) 02/24/2014 0359   CrCl cannot be calculated (Patient's most recent lab result is older than the maximum 21 days allowed.).  COAG Lab Results  Component Value Date   INR 1.28 06/28/2015   INR 1.04 05/07/2015   INR 1.0 02/22/2014   Radiology No results found.  Assessment/Plan 1.  Complication dialysis device with thrombosis AV access:   Patient's left upper extremity dialysis access is thrombosed. The patient will undergo thrombectomy using interventional techniques.  The risks and benefits were described to the patient.  All questions were answered.  The patient agrees to proceed with angiography and intervention. Potassium will be  drawn to ensure that it is an appropriate level prior to performing thrombectomy. 2.  End-stage renal disease requiring hemodialysis:  Patient will continue dialysis therapy without further interruption if a successful thrombectomy is not achieved then catheter will be placed. Dialysis has already been arranged since the patient missed their previous session 3.  Hypertension:  Patient will continue medical management; nephrology is following no changes in oral medications. 4. Diabetes mellitus:  Glucose will be monitored and oral medications been held this morning once the patient has undergone the patient's procedure po intake will be reinitiated and again Accu-Cheks will be used to assess the blood glucose level and treat as needed. The patient will be restarted on the patient's usual hypoglycemic regime  Discussed with Dr. Mayme Genta, PA-C  07/16/2020 2:34 PM

## 2020-07-16 NOTE — Op Note (Signed)
[facility]    OPERATIVE NOTE   PROCEDURE: 1.   Left brachiocephalic arteriovenous fistula cannulation under ultrasound guidance 2.   Left arm fistulagram including central venogram 3.   Percutaneous transluminal angioplasty of the left cephalic vein subclavian vein confluence with 8 mm diameter Lutonix drug-coated and 10 mm diameter high-pressure angioplasty balloons  PRE-OPERATIVE DIAGNOSIS: 1. ESRD 2. 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 Aneurysmal, tortuous left brachiocephalic AV fistula with multiple previous stent placements.  The multiple stents the cephalic vein subclavian vein confluence had hyperplastic stenosis in the mid segment of greater than 70%.  The remainder of the upper arm cephalic vein appeared to be patent without obvious focal stenosis.  The central venous circulation was patent without focal stenosis.  SPECIMEN(S):  None  CONTRAST: 35 cc  FLUORO TIME: 1.8 minutes  MODERATE CONSCIOUS SEDATION TIME: Approximately 27 minutes with 3 mg of Versed and 75 mcg of Fentanyl   INDICATIONS: [name] [name] is a 45 y.o. male who presents with malfunctioning aneurysmal, bleeding left brachiocephalic arteriovenous fistula.  The patient is scheduled for left arm fistulagram.  The patient is aware the risks include but are not limited to: bleeding, infection, thrombosis of the cannulated access, and possible anaphylactic reaction to the contrast.  The patient is aware of the risks of the procedure and elects to proceed forward.  DESCRIPTION: After full informed written consent was obtained, the patient was brought back to the angiography suite and placed supine upon the angiography table.  The patient was connected to monitoring equipment. Moderate conscious sedation was administered  with a face to face encounter with the patient throughout the procedure with my supervision of the RN administering medicines and monitoring the patient's vital signs and mental status throughout from the start of the procedure until the patient was taken to the recovery room. The left arm was prepped and draped in the standard fashion for a percutaneous access intervention.  Under ultrasound guidance, the left brachiocephalic arteriovenous fistula was cannulated with a micropuncture needle under direct ultrasound guidance where it was patent and a permanent image was performed.  The microwire was advanced into the fistula and the needle was exchanged for the a microsheath.  I then upsized to a 6 Fr Sheath and imaging was performed.  Hand injections were completed to image the access including the central venous system. This demonstrated and aneurysmal, tortuous left brachiocephalic AV fistula with multiple previous stent placements.  The multiple stents the cephalic vein subclavian vein confluence had hyperplastic stenosis in the mid segment of greater than 70%.  The remainder of the upper arm cephalic vein appeared to be patent without obvious focal stenosis.  The central venous circulation was patent without focal stenosis.  Based on the images, this patient will need intervention to the cephalic vein subclavian vein confluence stenosis to reduce the pressure and bleeding. I then gave the patient 3000 units of intravenous heparin.  I then crossed the stenosis with an advantage wire.  Based on the imaging, a 8 mm x 6 cm Lutonix drug-coated angioplasty balloon was selected.  The balloon was centered around the in-stent cephalic vein subclavian vein confluence stenosis and inflated to 12 ATM for 1 minute(s).  This was somewhat undersized, and I upsized to a 10 mm diameter by 4 cm length high-pressure angioplasty  balloon inflated this to 20 atm for 1 minute.  On completion imaging, a 10-15% residual stenosis was  present.     Based on the completion imaging, no further intervention is necessary.  The wire and balloon were removed from the sheath.  A 4-0 Monocryl purse-string suture was sewn around the sheath.  The sheath was removed while tying down the suture.  A sterile bandage was applied to the puncture site.  COMPLICATIONS: None  CONDITION: Stable   [name]  07/16/2020 4:02 PM   This note was created with Dragon Medical transcription system. Any errors in dictation are purely unintentional.

## 2020-07-17 ENCOUNTER — Encounter: Payer: Self-pay | Admitting: Vascular Surgery

## 2020-07-19 ENCOUNTER — Telehealth (INDEPENDENT_AMBULATORY_CARE_PROVIDER_SITE_OTHER): Payer: Self-pay

## 2020-07-19 NOTE — Telephone Encounter (Signed)
Pt called and left a VM on the nurses line saying that he has been having Vomiting, Headaches and is unable to eat since his out patient procedure on Monday. The patient wants to know what he should do since he has dialysis coming up

## 2020-07-19 NOTE — Telephone Encounter (Signed)
Has he had dialysis since then? If not, that may be where the symptoms are coming from

## 2020-07-19 NOTE — Telephone Encounter (Signed)
Please see below.

## 2020-07-19 NOTE — Telephone Encounter (Signed)
The pt is going to Dialysis tomorrow went yesterday as well  hasn't taken insulin or BP  medication since yesterday  still has a throbbing headache and vomiting

## 2020-07-20 NOTE — Telephone Encounter (Signed)
I called the pt and made him aware he said that he is at dialysis now and had to go back due to being unable to dialysis earlier , the pt also mention that he is clotting again in his graft an that he will need surgery I made the pt aware that the dialysis center will have to call over and make [place] aware and fax over the paperwork with the request.

## 2020-07-20 NOTE — Telephone Encounter (Signed)
Discussed with Dr. Lucky Cowboy.  Patient does not have a clotted access.  He has a large aneurysm in his access and there is clot around it.  When they stick the aneurysm, they are sticking the clot.  What likely needs to happen is his access will need to be revised and he have a perm cath placed.

## 2020-07-20 NOTE — Telephone Encounter (Signed)
ok 

## 2020-07-20 NOTE — Telephone Encounter (Signed)
It is highly likely that he has high blood sugars and high BP due to not taking his medications.  I would advise him to call his PCP or go to the ED

## 2020-07-31 ENCOUNTER — Telehealth (INDEPENDENT_AMBULATORY_CARE_PROVIDER_SITE_OTHER): Payer: Self-pay

## 2020-07-31 NOTE — Telephone Encounter (Signed)
I attempted to contact the patient to schedule him for a fistula revision with Dr. Lucky Cowboy and a message was left for a return call.

## 2020-08-07 ENCOUNTER — Encounter (INDEPENDENT_AMBULATORY_CARE_PROVIDER_SITE_OTHER): Payer: Self-pay | Admitting: Vascular Surgery

## 2020-08-07 ENCOUNTER — Ambulatory Visit (INDEPENDENT_AMBULATORY_CARE_PROVIDER_SITE_OTHER): Payer: Medicare Other | Admitting: Vascular Surgery

## 2020-08-07 ENCOUNTER — Other Ambulatory Visit
Admission: RE | Admit: 2020-08-07 | Discharge: 2020-08-07 | Disposition: A | Discharge status: Home / Self Care | Payer: Medicare Other | Source: Ambulatory Visit | Attending: Vascular Surgery | Admitting: Vascular Surgery

## 2020-08-07 ENCOUNTER — Other Ambulatory Visit: Payer: Self-pay

## 2020-08-07 VITALS — BP 91/57 | HR 88 | Resp 16 | Wt 278.8 lb

## 2020-08-07 DIAGNOSIS — N186 End stage renal disease: Secondary | ICD-10-CM

## 2020-08-07 DIAGNOSIS — Z01812 Encounter for preprocedural laboratory examination: Secondary | ICD-10-CM | POA: Diagnosis present

## 2020-08-07 DIAGNOSIS — I10 Essential (primary) hypertension: Secondary | ICD-10-CM

## 2020-08-07 DIAGNOSIS — E1165 Type 2 diabetes mellitus with hyperglycemia: Secondary | ICD-10-CM | POA: Diagnosis not present

## 2020-08-07 DIAGNOSIS — Z20822 Contact with and (suspected) exposure to covid-19: Secondary | ICD-10-CM | POA: Insufficient documentation

## 2020-08-07 DIAGNOSIS — Z794 Long term (current) use of insulin: Secondary | ICD-10-CM | POA: Diagnosis not present

## 2020-08-07 NOTE — Progress Notes (Signed)
MRN : [id number]  [name] [name] is a 45 y.o. ([birth year]) male who presents with chief complaint of  Chief Complaint  Patient presents with  . Follow-up    Discuss surgery  .  History of Present Illness: Patient returns today in follow up of his dialysis access.  He has questions about his upcoming surgery that is scheduled.  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  This is intermittently usable for dialysis.  It also has 2 areas of skin breakdown.  Current Outpatient Medications  Medication Sig Dispense Refill  . aspirin EC 81 MG tablet Take 81 mg by mouth daily at 6 PM. (1700)    . carvedilol (COREG) 12.5 MG tablet Take 12.5 mg by mouth 2 (two) times daily with a meal.     . HUMULIN 70/30 (70-30) 100 UNIT/ML injection Inject 10-30 Units into the skin See admin instructions. 10 units before breakfast and 30 units before supper ON DIALYSIS DAYS (M-W-F), NO AM INSULIN    . lanthanum (FOSRENOL) 1000 MG chewable tablet Chew 2,000 mg by mouth 3 (three) times daily with meals. Takes as a powder    . loperamide (IMODIUM) 2 MG capsule Take 2 mg by mouth as needed for diarrhea or loose stools (Take 1/2-1 tablet as meeded to prevent diarrhea. Can take up to 1 tablet every 4 hours. Do not exceed 4 tablets in a day.).    Marland Kitchen losartan (COZAAR) 50 MG tablet Take 50 mg by mouth daily. (1700)    . omeprazole (PRILOSEC) 20 MG capsule Take by mouth.    . SENSIPAR 60 MG tablet Take 60 mg by mouth daily at 6 PM. 1700  3  . sevelamer carbonate (RENVELA) 2.4 g PACK     . calcium acetate (PHOSLO) 667 MG capsule Take by mouth in the morning and at bedtime. Takes in AM (Patient not taking: No sig reported)    . HYDROcodone-acetaminophen (NORCO/VICODIN) 5-325 MG tablet Take 1 tablet by mouth every 6 (six) hours as needed for moderate pain. (Patient not taking: No sig reported) 15 tablet 0   No current facility-administered medications for this visit.    Past Medical History:  Diagnosis Date   . CHF (congestive heart failure) ([place])   . Coronary artery disease   . Diabetes mellitus without complication ([place])   . Dialysis patient [facility])    M-W-F  . GERD (gastroesophageal reflux disease)   . Hypertension   . Kyrle's disease   . Renal disorder    on dialysis for 4 years three times a week last was Tuesday  . Secondary hyperparathyroidism of renal origin ([place])   . Shortness of breath dyspnea   . Type 2 diabetes mellitus with diabetic nephropathy ([place])   . Type 2 diabetes mellitus with diabetic retinopathy [facility])     Past Surgical History:  Procedure Laterality Date  . A/V FISTULAGRAM Left 10/01/2017   Procedure: A/V FISTULAGRAM;  Surgeon: Algernon Huxley, MD;  Location: [place] [facility];  Service: Cardiovascular;  Laterality: Left;  . A/V FISTULAGRAM Left 05/17/2020   Procedure: A/V FISTULAGRAM;  Surgeon: Algernon Huxley, MD;  Location: [place] [facility];  Service: Cardiovascular;  Laterality: Left;  . CATARACT EXTRACTION W/PHACO Right 06/30/2018   Procedure: CATARACT EXTRACTION PHACO AND INTRAOCULAR LENS PLACEMENT (IOC)-RIGHT;  Surgeon: Eulogio Bear, MD;  Location: [facility] ORS;  Service: Ophthalmology;  Laterality: Right;  [place] 00:07.0 CDE 0.16 FLUID PACK lOT # [id number]  . CATARACT  EXTRACTION W/PHACO Left 12/24/2018   Procedure: CATARACT EXTRACTION PHACO AND INTRAOCULAR LENS PLACEMENT (IOC);  Surgeon: Eulogio Bear, MD;  Location: [facility] ORS;  Service: Ophthalmology;  Laterality: Left;  [place] 00:30.0 CDE 2.01 FLUID PACK LOT # [id number]  . COLONOSCOPY WITH PROPOFOL N/A 04/23/2017   Procedure: COLONOSCOPY WITH PROPOFOL;  Surgeon: [name], MD;  Location: [facility] ENDOSCOPY;  Service: Endoscopy;  Laterality: N/A;  . COLONOSCOPY WITH PROPOFOL N/A 07/02/2017   Procedure: COLONOSCOPY WITH PROPOFOL;  Surgeon: [name], MD;  Location: [facility] ENDOSCOPY;  Service: Endoscopy;  Laterality: N/A;  . ESOPHAGOGASTRODUODENOSCOPY (EGD) WITH PROPOFOL N/A 04/23/2017   Procedure:  ESOPHAGOGASTRODUODENOSCOPY (EGD) WITH PROPOFOL;  Surgeon: [name], MD;  Location: [facility] ENDOSCOPY;  Service: Endoscopy;  Laterality: N/A;  . ESOPHAGOGASTRODUODENOSCOPY (EGD) WITH PROPOFOL N/A 03/27/2020   Procedure: ESOPHAGOGASTRODUODENOSCOPY (EGD) WITH PROPOFOL;  Surgeon: Lesly Rubenstein, MD;  Location: [facility] ENDOSCOPY;  Service: Endoscopy;  Laterality: N/A;  . EYE SURGERY     RETINA  . PERIPHERAL VASCULAR CATHETERIZATION N/A 05/07/2015   Procedure: A/V Shuntogram/Fistulagram;  Surgeon: Katha Cabal, MD;  Location: [place] [facility];  Service: Cardiovascular;  Laterality: N/A;  . PERIPHERAL VASCULAR CATHETERIZATION Left 05/07/2015   Procedure: A/V Shunt Intervention;  Surgeon: Katha Cabal, MD;  Location: [place] [facility];  Service: Cardiovascular;  Laterality: Left;  . PERIPHERAL VASCULAR THROMBECTOMY Left 07/16/2020   Procedure: PERIPHERAL VASCULAR THROMBECTOMY;  Surgeon: Algernon Huxley, MD;  Location: [place] [facility];  Service: Cardiovascular;  Laterality: Left;  . TEE WITHOUT CARDIOVERSION N/A 07/05/2015   Procedure: TRANSESOPHAGEAL ECHOCARDIOGRAM (TEE);  Surgeon: Dionisio David, MD;  Location: [facility] ORS;  Service: Cardiovascular;  Laterality: N/A;  . UPPER EXTREMITY ANGIOGRAPHY Left 06/24/2018   Procedure: UPPER EXTREMITY ANGIOGRAPHY;  Surgeon: Algernon Huxley, MD;  Location: [place] [facility];  Service: Cardiovascular;  Laterality: Left;     Social History   Tobacco Use  . Smoking status: Never Smoker  . Smokeless tobacco: Never Used  Vaping Use  . Vaping Use: Never used  Substance Use Topics  . Alcohol use: Yes    Comment: occ. shot  . Drug use: No      Family Histroy No history of bleeding disorders, clotting disorders, aneurysms, porphyria, or autoimmune diseases  Allergies  Allergen Reactions  . Cephalexin Nausea Only  . Ivp Dye [Iodinated Diagnostic Agents] Diarrhea  . Lisinopril Other (See Comments)    Pt cannot recall reaction, cough  .  Other      REVIEW OF SYSTEMS (Negative unless checked)  Constitutional: '[]'$ Weight loss  '[]'$ Fever  '[]'$ Chills Cardiac: '[]'$ Chest pain   '[]'$ Chest pressure   '[]'$ Palpitations   '[]'$ Shortness of breath when laying flat   '[]'$ Shortness of breath at rest   '[x]'$ Shortness of breath with exertion. Vascular:  '[]'$ Pain in legs with walking   '[]'$ Pain in legs at rest   '[]'$ Pain in legs when laying flat   '[]'$ Claudication   '[]'$ Pain in feet when walking  '[]'$ Pain in feet at rest  '[]'$ Pain in feet when laying flat   '[]'$ History of DVT   '[]'$ Phlebitis   '[]'$ Swelling in legs   '[]'$ Varicose veins   '[x]'$ Non-healing ulcers Pulmonary:   '[]'$ Uses home oxygen   '[]'$ Productive cough   '[]'$ Hemoptysis   '[]'$ Wheeze  '[]'$ COPD   '[]'$ Asthma Neurologic:  '[]'$ Dizziness  '[]'$ Blackouts   '[]'$ Seizures   '[]'$ History of stroke   '[]'$ History of TIA  '[]'$ Aphasia   '[]'$ Temporary blindness   '[]'$ Dysphagia   '[]'$ Weakness or numbness in  arms   '[]'$ Weakness or numbness in legs Musculoskeletal:  '[]'$ Arthritis   '[]'$ Joint swelling   '[]'$ Joint pain   '[]'$ Low back pain Hematologic:  '[]'$ Easy bruising  '[]'$ Easy bleeding   '[]'$ Hypercoagulable state   '[x]'$ Anemic   Gastrointestinal:  '[]'$ Blood in stool   '[]'$ Vomiting blood  '[]'$ Gastroesophageal reflux/heartburn   '[]'$ Abdominal pain Genitourinary:  '[x]'$ Chronic kidney disease   '[]'$ Difficult urination  '[]'$ Frequent urination  '[]'$ Burning with urination   '[]'$ Hematuria Skin:  '[]'$ Rashes   '[x]'$ Ulcers   '[x]'$ Wounds Psychological:  '[]'$ History of anxiety   '[]'$  History of major depression.  Physical Examination  BP (!) 91/57 (BP Location: Right Arm)   Pulse 88   Resp 16   Wt 278 lb 12.8 oz (126.5 kg)   BMI 36.78 kg/m  Gen:  WD/WN, NAD Head: [place]/AT, No temporalis wasting. Ear/Nose/Throat: Hearing grossly intact, nares w/o erythema or drainage Eyes: Conjunctiva clear. Sclera non-icteric Neck: Supple.  Trachea midline Pulmonary:  Good air movement, no use of accessory muscles.  Cardiac: RRR, no JVD Vascular: Markedly aneurysmal left upper arm AV fistula.  2 areas with scab present worrisome for  bleeding and skin breakdown. Vessel Right Left  Radial Palpable Palpable           Musculoskeletal: M/S 5/5 throughout.  No deformity or atrophy. 2+ LE edema. Neurologic: Sensation grossly intact in extremities.  Symmetrical.  Speech is fluent.  Psychiatric: Judgment intact, Mood & affect appropriate for pt's clinical situation. Dermatologic: No rashes or ulcers noted.  No cellulitis or open wounds.       Labs Recent Results (from the past 2160 hour(s))  SARS CORONAVIRUS 2 (TAT 6-24 HRS) Nasopharyngeal Nasopharyngeal Swab     Status: None   Collection Time: 05/15/20  1:01 PM   Specimen: Nasopharyngeal Swab  Result Value Ref Range   SARS Coronavirus 2 NEGATIVE NEGATIVE    Comment: (NOTE) SARS-CoV-2 target nucleic acids are NOT DETECTED.  The SARS-CoV-2 RNA is generally detectable in upper and lower respiratory specimens during the acute phase of infection. Negative results do not preclude SARS-CoV-2 infection, do not rule out co-infections with other pathogens, and should not be used as the sole basis for treatment or other patient management decisions. Negative results must be combined with clinical observations, patient history, and epidemiological information. The expected result is Negative.  Fact Sheet for Patients: SugarRoll.be  Fact Sheet for Healthcare Providers: https://www.woods-mathews.com/  This test is not yet approved or cleared by the [place] FDA and  has been authorized for detection and/or diagnosis of SARS-CoV-2 by FDA under an Emergency Use Authorization (EUA). This EUA will remain  in effect (meaning this test can be used) for the duration of the COVID-19 declaration under Se ction 564(b)(1) of the Act, 21 U.S.C. section 360bbb-3(b)(1), unless the authorization is terminated or revoked sooner.  Performed at [facility], [place] [address] [place], [place] [address]   Potassium [facility] vascular lab  only)     Status: Abnormal   Collection Time: 05/17/20  8:00 AM  Result Value Ref Range   Potassium [facility] vascular lab) 5.4 (H) 3.5 - 5.1    Comment: Performed at [facility], [address] [place], [place] [address]  Glucose, capillary     Status: Abnormal   Collection Time: 05/17/20  8:08 AM  Result Value Ref Range   Glucose-Capillary 420 (H) 70 - 99 mg/dL    Comment: Glucose reference range applies only to samples taken after fasting for at least 8 hours.  Glucose, capillary  Status: Abnormal   Collection Time: 05/17/20  9:36 AM  Result Value Ref Range   Glucose-Capillary 331 (H) 70 - 99 mg/dL    Comment: Glucose reference range applies only to samples taken after fasting for at least 8 hours.  SARS Coronavirus 2 by RT PCR (hospital order, performed in [facility] hospital lab) Nasopharyngeal Nasopharyngeal Swab     Status: None   Collection Time: 07/16/20  9:59 AM   Specimen: Nasopharyngeal Swab  Result Value Ref Range   SARS Coronavirus 2 NEGATIVE NEGATIVE    Comment: (NOTE) SARS-CoV-2 target nucleic acids are NOT DETECTED.  The SARS-CoV-2 RNA is generally detectable in upper and lower respiratory specimens during the acute phase of infection. The lowest concentration of SARS-CoV-2 viral copies this assay can detect is 250 copies / mL. A negative result does not preclude SARS-CoV-2 infection and should not be used as the sole basis for treatment or other patient management decisions.  A negative result may occur with improper specimen collection / handling, submission of specimen other than nasopharyngeal swab, presence of viral mutation(s) within the areas targeted by this assay, and inadequate number of viral copies (<250 copies / mL). A negative result must be combined with clinical observations, patient history, and epidemiological information.  Fact Sheet for Patients:   StrictlyIdeas.no  Fact Sheet for Healthcare  Providers: BankingDealers.co.za  This test is not yet approved or  cleared by the [place] FDA and has been authorized for detection and/or diagnosis of SARS-CoV-2 by FDA under an Emergency Use Authorization (EUA).  This EUA will remain in effect (meaning this test can be used) for the duration of the COVID-19 declaration under Section 564(b)(1) of the Act, 21 U.S.C. section 360bbb-3(b)(1), unless the authorization is terminated or revoked sooner.  Performed at [facility], [address], [place], [place] [address]   Potassium [facility] vascular lab only)     Status: Abnormal   Collection Time: 07/16/20  2:07 PM  Result Value Ref Range   Potassium [facility] vascular lab) 5.3 (H) 3.5 - 5.1    Comment: Performed at [facility], [address] [place], [place] [address]  Glucose, capillary     Status: Abnormal   Collection Time: 07/16/20  3:00 PM  Result Value Ref Range   Glucose-Capillary 156 (H) 70 - 99 mg/dL    Comment: Glucose reference range applies only to samples taken after fasting for at least 8 hours.  Glucose, capillary     Status: Abnormal   Collection Time: 07/16/20  4:16 PM  Result Value Ref Range   Glucose-Capillary 156 (H) 70 - 99 mg/dL    Comment: Glucose reference range applies only to samples taken after fasting for at least 8 hours.    Radiology PERIPHERAL VASCULAR CATHETERIZATION  Result Date: 07/16/2020 See op note   Assessment/Plan  End stage renal disease ([place]) His access is markedly aneurysmal and they are having difficulty getting adequate sticks and hemostasis.  This needs to be surgically revised.  He will also need a PermCath while this wound heals.  This been explained to him today and multiple times in the past.  We will get this scheduled in the near future at his convenience.  Uncontrolled type 2 diabetes mellitus with hyperglycemia, with long-term current use of insulin (HCC) blood glucose control  important in reducing the progression of atherosclerotic disease. Also, involved in wound healing. On appropriate medications.   Hypertension blood pressure control important in reducing the progression of atherosclerotic  disease. On appropriate oral medications.     [name], MD  08/07/2020 4:15 PM    This note was created with Dragon medical transcription system.  Any errors from dictation are purely unintentional

## 2020-08-07 NOTE — H&P (View-Only) (Signed)
MRN : [id number]  [name] [name] is a 45 y.o. (1975/12/25) male who presents with chief complaint of  Chief Complaint  Patient presents with  . Follow-up    Discuss surgery  .  History of Present Illness: Patient returns today in follow up of his dialysis access.  He has questions about his upcoming surgery that is scheduled.  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  This is intermittently usable for dialysis.  It also has 2 areas of skin breakdown.  Current Outpatient Medications  Medication Sig Dispense Refill  . aspirin EC 81 MG tablet Take 81 mg by mouth daily at 6 PM. (1700)    . carvedilol (COREG) 12.5 MG tablet Take 12.5 mg by mouth 2 (two) times daily with a meal.     . HUMULIN 70/30 (70-30) 100 UNIT/ML injection Inject 10-30 Units into the skin See admin instructions. 10 units before breakfast and 30 units before supper ON DIALYSIS DAYS (M-W-F), NO AM INSULIN    . lanthanum (FOSRENOL) 1000 MG chewable tablet Chew 2,000 mg by mouth 3 (three) times daily with meals. Takes as a powder    . loperamide (IMODIUM) 2 MG capsule Take 2 mg by mouth as needed for diarrhea or loose stools (Take 1/2-1 tablet as meeded to prevent diarrhea. Can take up to 1 tablet every 4 hours. Do not exceed 4 tablets in a day.).    Marland Kitchen losartan (COZAAR) 50 MG tablet Take 50 mg by mouth daily. (1700)    . omeprazole (PRILOSEC) 20 MG capsule Take by mouth.    . SENSIPAR 60 MG tablet Take 60 mg by mouth daily at 6 PM. 1700  3  . sevelamer carbonate (RENVELA) 2.4 g PACK     . calcium acetate (PHOSLO) 667 MG capsule Take by mouth in the morning and at bedtime. Takes in AM (Patient not taking: No sig reported)    . HYDROcodone-acetaminophen (NORCO/VICODIN) 5-325 MG tablet Take 1 tablet by mouth every 6 (six) hours as needed for moderate pain. (Patient not taking: No sig reported) 15 tablet 0   No current facility-administered medications for this visit.    Past Medical History:  Diagnosis Date   . CHF (congestive heart failure) ([place])   . Coronary artery disease   . Diabetes mellitus without complication ([place])   . Dialysis patient [facility])    M-W-F  . GERD (gastroesophageal reflux disease)   . Hypertension   . Kyrle's disease   . Renal disorder    on dialysis for 4 years three times a week last was Tuesday  . Secondary hyperparathyroidism of renal origin ([place])   . Shortness of breath dyspnea   . Type 2 diabetes mellitus with diabetic nephropathy ([place])   . Type 2 diabetes mellitus with diabetic retinopathy [facility])     Past Surgical History:  Procedure Laterality Date  . A/V FISTULAGRAM Left 10/01/2017   Procedure: A/V FISTULAGRAM;  Surgeon: Algernon Huxley, MD;  Location: [place] [facility] LAB;  Service: Cardiovascular;  Laterality: Left;  . A/V FISTULAGRAM Left 05/17/2020   Procedure: A/V FISTULAGRAM;  Surgeon: Algernon Huxley, MD;  Location: [place] [facility];  Service: Cardiovascular;  Laterality: Left;  . CATARACT EXTRACTION W/PHACO Right 06/30/2018   Procedure: CATARACT EXTRACTION PHACO AND INTRAOCULAR LENS PLACEMENT (IOC)-RIGHT;  Surgeon: Eulogio Bear, MD;  Location: [facility] ORS;  Service: Ophthalmology;  Laterality: Right;  [place] 00:07.0 CDE 0.16 FLUID PACK lOT # [id number]  . CATARACT  EXTRACTION W/PHACO Left 12/24/2018   Procedure: CATARACT EXTRACTION PHACO AND INTRAOCULAR LENS PLACEMENT (IOC);  Surgeon: Eulogio Bear, MD;  Location: [facility] ORS;  Service: Ophthalmology;  Laterality: Left;  [place] 00:30.0 CDE 2.01 FLUID PACK LOT # [id number]  . COLONOSCOPY WITH PROPOFOL N/A 04/23/2017   Procedure: COLONOSCOPY WITH PROPOFOL;  Surgeon: [name], MD;  Location: [facility] ENDOSCOPY;  Service: Endoscopy;  Laterality: N/A;  . COLONOSCOPY WITH PROPOFOL N/A 07/02/2017   Procedure: COLONOSCOPY WITH PROPOFOL;  Surgeon: [name], MD;  Location: [facility] ENDOSCOPY;  Service: Endoscopy;  Laterality: N/A;  . ESOPHAGOGASTRODUODENOSCOPY (EGD) WITH PROPOFOL N/A 04/23/2017   Procedure:  ESOPHAGOGASTRODUODENOSCOPY (EGD) WITH PROPOFOL;  Surgeon: [name], MD;  Location: [facility] ENDOSCOPY;  Service: Endoscopy;  Laterality: N/A;  . ESOPHAGOGASTRODUODENOSCOPY (EGD) WITH PROPOFOL N/A 03/27/2020   Procedure: ESOPHAGOGASTRODUODENOSCOPY (EGD) WITH PROPOFOL;  Surgeon: Lesly Rubenstein, MD;  Location: [facility] ENDOSCOPY;  Service: Endoscopy;  Laterality: N/A;  . EYE SURGERY     RETINA  . PERIPHERAL VASCULAR CATHETERIZATION N/A 05/07/2015   Procedure: A/V Shuntogram/Fistulagram;  Surgeon: Katha Cabal, MD;  Location: [place] [facility];  Service: Cardiovascular;  Laterality: N/A;  . PERIPHERAL VASCULAR CATHETERIZATION Left 05/07/2015   Procedure: A/V Shunt Intervention;  Surgeon: Katha Cabal, MD;  Location: [place] [facility];  Service: Cardiovascular;  Laterality: Left;  . PERIPHERAL VASCULAR THROMBECTOMY Left 07/16/2020   Procedure: PERIPHERAL VASCULAR THROMBECTOMY;  Surgeon: Algernon Huxley, MD;  Location: [place] [facility];  Service: Cardiovascular;  Laterality: Left;  . TEE WITHOUT CARDIOVERSION N/A 07/05/2015   Procedure: TRANSESOPHAGEAL ECHOCARDIOGRAM (TEE);  Surgeon: Dionisio David, MD;  Location: [facility] ORS;  Service: Cardiovascular;  Laterality: N/A;  . UPPER EXTREMITY ANGIOGRAPHY Left 06/24/2018   Procedure: UPPER EXTREMITY ANGIOGRAPHY;  Surgeon: Algernon Huxley, MD;  Location: [place] [facility] LAB;  Service: Cardiovascular;  Laterality: Left;     Social History   Tobacco Use  . Smoking status: Never Smoker  . Smokeless tobacco: Never Used  Vaping Use  . Vaping Use: Never used  Substance Use Topics  . Alcohol use: Yes    Comment: occ. shot  . Drug use: No      Family Histroy No history of bleeding disorders, clotting disorders, aneurysms, porphyria, or autoimmune diseases  Allergies  Allergen Reactions  . Cephalexin Nausea Only  . Ivp Dye [Iodinated Diagnostic Agents] Diarrhea  . Lisinopril Other (See Comments)    Pt cannot recall reaction, cough  .  Other      REVIEW OF SYSTEMS (Negative unless checked)  Constitutional: '[]'$ Weight loss  '[]'$ Fever  '[]'$ Chills Cardiac: '[]'$ Chest pain   '[]'$ Chest pressure   '[]'$ Palpitations   '[]'$ Shortness of breath when laying flat   '[]'$ Shortness of breath at rest   '[x]'$ Shortness of breath with exertion. Vascular:  '[]'$ Pain in legs with walking   '[]'$ Pain in legs at rest   '[]'$ Pain in legs when laying flat   '[]'$ Claudication   '[]'$ Pain in feet when walking  '[]'$ Pain in feet at rest  '[]'$ Pain in feet when laying flat   '[]'$ History of DVT   '[]'$ Phlebitis   '[]'$ Swelling in legs   '[]'$ Varicose veins   '[x]'$ Non-healing ulcers Pulmonary:   '[]'$ Uses home oxygen   '[]'$ Productive cough   '[]'$ Hemoptysis   '[]'$ Wheeze  '[]'$ COPD   '[]'$ Asthma Neurologic:  '[]'$ Dizziness  '[]'$ Blackouts   '[]'$ Seizures   '[]'$ History of stroke   '[]'$ History of TIA  '[]'$ Aphasia   '[]'$ Temporary blindness   '[]'$ Dysphagia   '[]'$ Weakness or numbness in  arms   '[]'$ Weakness or numbness in legs Musculoskeletal:  '[]'$ Arthritis   '[]'$ Joint swelling   '[]'$ Joint pain   '[]'$ Low back pain Hematologic:  '[]'$ Easy bruising  '[]'$ Easy bleeding   '[]'$ Hypercoagulable state   '[x]'$ Anemic   Gastrointestinal:  '[]'$ Blood in stool   '[]'$ Vomiting blood  '[]'$ Gastroesophageal reflux/heartburn   '[]'$ Abdominal pain Genitourinary:  '[x]'$ Chronic kidney disease   '[]'$ Difficult urination  '[]'$ Frequent urination  '[]'$ Burning with urination   '[]'$ Hematuria Skin:  '[]'$ Rashes   '[x]'$ Ulcers   '[x]'$ Wounds Psychological:  '[]'$ History of anxiety   '[]'$  History of major depression.  Physical Examination  BP (!) 91/57 (BP Location: Right Arm)   Pulse 88   Resp 16   Wt 278 lb 12.8 oz (126.5 kg)   BMI 36.78 kg/m  Gen:  WD/WN, NAD Head: [place]/AT, No temporalis wasting. Ear/Nose/Throat: Hearing grossly intact, nares w/o erythema or drainage Eyes: Conjunctiva clear. Sclera non-icteric Neck: Supple.  Trachea midline Pulmonary:  Good air movement, no use of accessory muscles.  Cardiac: RRR, no JVD Vascular: Markedly aneurysmal left upper arm AV fistula.  2 areas with scab present worrisome for  bleeding and skin breakdown. Vessel Right Left  Radial Palpable Palpable           Musculoskeletal: M/S 5/5 throughout.  No deformity or atrophy. 2+ LE edema. Neurologic: Sensation grossly intact in extremities.  Symmetrical.  Speech is fluent.  Psychiatric: Judgment intact, Mood & affect appropriate for pt's clinical situation. Dermatologic: No rashes or ulcers noted.  No cellulitis or open wounds.       Labs Recent Results (from the past 2160 hour(s))  SARS CORONAVIRUS 2 (TAT 6-24 HRS) Nasopharyngeal Nasopharyngeal Swab     Status: None   Collection Time: 05/15/20  1:01 PM   Specimen: Nasopharyngeal Swab  Result Value Ref Range   SARS Coronavirus 2 NEGATIVE NEGATIVE    Comment: (NOTE) SARS-CoV-2 target nucleic acids are NOT DETECTED.  The SARS-CoV-2 RNA is generally detectable in upper and lower respiratory specimens during the acute phase of infection. Negative results do not preclude SARS-CoV-2 infection, do not rule out co-infections with other pathogens, and should not be used as the sole basis for treatment or other patient management decisions. Negative results must be combined with clinical observations, patient history, and epidemiological information. The expected result is Negative.  Fact Sheet for Patients: SugarRoll.be  Fact Sheet for Healthcare Providers: https://www.woods-mathews.com/  This test is not yet approved or cleared by the [place] FDA and  has been authorized for detection and/or diagnosis of SARS-CoV-2 by FDA under an Emergency Use Authorization (EUA). This EUA will remain  in effect (meaning this test can be used) for the duration of the COVID-19 declaration under Se ction 564(b)(1) of the Act, 21 U.S.C. section 360bbb-3(b)(1), unless the authorization is terminated or revoked sooner.  Performed at [facility], [place] [address] [place], [place] [address]   Potassium [facility] vascular lab  only)     Status: Abnormal   Collection Time: 05/17/20  8:00 AM  Result Value Ref Range   Potassium [facility] vascular lab) 5.4 (H) 3.5 - 5.1    Comment: Performed at [facility], [address] [place], [place] [address]  Glucose, capillary     Status: Abnormal   Collection Time: 05/17/20  8:08 AM  Result Value Ref Range   Glucose-Capillary 420 (H) 70 - 99 mg/dL    Comment: Glucose reference range applies only to samples taken after fasting for at least 8 hours.  Glucose, capillary  Status: Abnormal   Collection Time: 05/17/20  9:36 AM  Result Value Ref Range   Glucose-Capillary 331 (H) 70 - 99 mg/dL    Comment: Glucose reference range applies only to samples taken after fasting for at least 8 hours.  SARS Coronavirus 2 by RT PCR (hospital order, performed in [facility] hospital lab) Nasopharyngeal Nasopharyngeal Swab     Status: None   Collection Time: 07/16/20  9:59 AM   Specimen: Nasopharyngeal Swab  Result Value Ref Range   SARS Coronavirus 2 NEGATIVE NEGATIVE    Comment: (NOTE) SARS-CoV-2 target nucleic acids are NOT DETECTED.  The SARS-CoV-2 RNA is generally detectable in upper and lower respiratory specimens during the acute phase of infection. The lowest concentration of SARS-CoV-2 viral copies this assay can detect is 250 copies / mL. A negative result does not preclude SARS-CoV-2 infection and should not be used as the sole basis for treatment or other patient management decisions.  A negative result may occur with improper specimen collection / handling, submission of specimen other than nasopharyngeal swab, presence of viral mutation(s) within the areas targeted by this assay, and inadequate number of viral copies (<250 copies / mL). A negative result must be combined with clinical observations, patient history, and epidemiological information.  Fact Sheet for Patients:   StrictlyIdeas.no  Fact Sheet for Healthcare  Providers: BankingDealers.co.za  This test is not yet approved or  cleared by the [place] FDA and has been authorized for detection and/or diagnosis of SARS-CoV-2 by FDA under an Emergency Use Authorization (EUA).  This EUA will remain in effect (meaning this test can be used) for the duration of the COVID-19 declaration under Section 564(b)(1) of the Act, 21 U.S.C. section 360bbb-3(b)(1), unless the authorization is terminated or revoked sooner.  Performed at [facility], [address], [place], [place] [address]   Potassium [facility] vascular lab only)     Status: Abnormal   Collection Time: 07/16/20  2:07 PM  Result Value Ref Range   Potassium [facility] vascular lab) 5.3 (H) 3.5 - 5.1    Comment: Performed at [facility], [address] [place], [place] [address]  Glucose, capillary     Status: Abnormal   Collection Time: 07/16/20  3:00 PM  Result Value Ref Range   Glucose-Capillary 156 (H) 70 - 99 mg/dL    Comment: Glucose reference range applies only to samples taken after fasting for at least 8 hours.  Glucose, capillary     Status: Abnormal   Collection Time: 07/16/20  4:16 PM  Result Value Ref Range   Glucose-Capillary 156 (H) 70 - 99 mg/dL    Comment: Glucose reference range applies only to samples taken after fasting for at least 8 hours.    Radiology PERIPHERAL VASCULAR CATHETERIZATION  Result Date: 07/16/2020 See op note   Assessment/Plan  End stage renal disease ([place]) His access is markedly aneurysmal and they are having difficulty getting adequate sticks and hemostasis.  This needs to be surgically revised.  He will also need a PermCath while this wound heals.  This been explained to him today and multiple times in the past.  We will get this scheduled in the near future at his convenience.  Uncontrolled type 2 diabetes mellitus with hyperglycemia, with long-term current use of insulin (HCC) blood glucose control  important in reducing the progression of atherosclerotic disease. Also, involved in wound healing. On appropriate medications.   Hypertension blood pressure control important in reducing the progression of atherosclerotic  disease. On appropriate oral medications.     [name], MD  08/07/2020 4:15 PM    This note was created with Dragon medical transcription system.  Any errors from dictation are purely unintentional

## 2020-08-07 NOTE — Assessment & Plan Note (Signed)
blood glucose control important in reducing the progression of atherosclerotic disease. Also, involved in wound healing. On appropriate medications.  

## 2020-08-07 NOTE — Progress Notes (Addendum)
No show for covid test today. Call to patient, left message of his covid test appt. And will be open tomorrow at 8 am. Patient came in for covid test at 2:00 pm. Tested and sent to the lab

## 2020-08-07 NOTE — Assessment & Plan Note (Signed)
His access is markedly aneurysmal and they are having difficulty getting adequate sticks and hemostasis.  This needs to be surgically revised.  He will also need a PermCath while this wound heals.  This been explained to him today and multiple times in the past.  We will get this scheduled in the near future at his convenience.

## 2020-08-07 NOTE — Assessment & Plan Note (Signed)
blood pressure control important in reducing the progression of atherosclerotic disease. On appropriate oral medications.  

## 2020-08-08 ENCOUNTER — Other Ambulatory Visit (INDEPENDENT_AMBULATORY_CARE_PROVIDER_SITE_OTHER): Payer: Self-pay | Admitting: Nurse Practitioner

## 2020-08-08 LAB — SARS CORONAVIRUS 2 (TAT 6-24 HRS): SARS Coronavirus 2: NEGATIVE

## 2020-08-08 NOTE — Telephone Encounter (Signed)
Patient scheduled with Dr. Lucky Cowboy for surgery on 08/09/20 for a left fistula revision and permcath placement. Patient is to arrive 2 hours early for pre-op and covid testing was on 08/07/20. Pre-surgical instructions were discussed with patient.

## 2020-08-09 ENCOUNTER — Ambulatory Visit: Payer: Medicare Other | Admitting: Anesthesiology

## 2020-08-09 ENCOUNTER — Ambulatory Visit: Payer: Medicare Other

## 2020-08-09 ENCOUNTER — Ambulatory Visit
Admission: RE | Admit: 2020-08-09 | Discharge: 2020-08-09 | Disposition: A | Discharge status: Home / Self Care | Payer: Medicare Other | Attending: Vascular Surgery | Admitting: Vascular Surgery

## 2020-08-09 ENCOUNTER — Other Ambulatory Visit (INDEPENDENT_AMBULATORY_CARE_PROVIDER_SITE_OTHER): Payer: Self-pay | Admitting: Vascular Surgery

## 2020-08-09 ENCOUNTER — Other Ambulatory Visit: Payer: Self-pay

## 2020-08-09 ENCOUNTER — Encounter: Payer: Self-pay | Admitting: Vascular Surgery

## 2020-08-09 ENCOUNTER — Encounter
Admission: RE | Disposition: A | Discharge status: Home / Self Care | Payer: Self-pay | Source: Home / Self Care | Attending: Vascular Surgery

## 2020-08-09 DIAGNOSIS — E1122 Type 2 diabetes mellitus with diabetic chronic kidney disease: Secondary | ICD-10-CM | POA: Insufficient documentation

## 2020-08-09 DIAGNOSIS — N186 End stage renal disease: Secondary | ICD-10-CM | POA: Insufficient documentation

## 2020-08-09 DIAGNOSIS — Z419 Encounter for procedure for purposes other than remedying health state, unspecified: Secondary | ICD-10-CM

## 2020-08-09 DIAGNOSIS — Y832 Surgical operation with anastomosis, bypass or graft as the cause of abnormal reaction of the patient, or of later complication, without mention of misadventure at the time of the procedure: Secondary | ICD-10-CM | POA: Diagnosis not present

## 2020-08-09 DIAGNOSIS — Z881 Allergy status to other antibiotic agents status: Secondary | ICD-10-CM | POA: Insufficient documentation

## 2020-08-09 DIAGNOSIS — T82898A Other specified complication of vascular prosthetic devices, implants and grafts, initial encounter: Secondary | ICD-10-CM | POA: Diagnosis present

## 2020-08-09 DIAGNOSIS — Z992 Dependence on renal dialysis: Secondary | ICD-10-CM | POA: Insufficient documentation

## 2020-08-09 DIAGNOSIS — Z888 Allergy status to other drugs, medicaments and biological substances status: Secondary | ICD-10-CM | POA: Diagnosis not present

## 2020-08-09 DIAGNOSIS — Z7982 Long term (current) use of aspirin: Secondary | ICD-10-CM | POA: Diagnosis not present

## 2020-08-09 DIAGNOSIS — Z79899 Other long term (current) drug therapy: Secondary | ICD-10-CM | POA: Insufficient documentation

## 2020-08-09 DIAGNOSIS — Z01810 Encounter for preprocedural cardiovascular examination: Secondary | ICD-10-CM | POA: Diagnosis not present

## 2020-08-09 DIAGNOSIS — I509 Heart failure, unspecified: Secondary | ICD-10-CM | POA: Insufficient documentation

## 2020-08-09 DIAGNOSIS — Z794 Long term (current) use of insulin: Secondary | ICD-10-CM | POA: Diagnosis not present

## 2020-08-09 DIAGNOSIS — I132 Hypertensive heart and chronic kidney disease with heart failure and with stage 5 chronic kidney disease, or end stage renal disease: Secondary | ICD-10-CM | POA: Diagnosis not present

## 2020-08-09 HISTORY — PX: REVISON OF ARTERIOVENOUS FISTULA: SHX6074

## 2020-08-09 HISTORY — PX: INSERTION OF DIALYSIS CATHETER: SHX1324

## 2020-08-09 LAB — TYPE AND SCREEN
ABO/RH(D): O POS
Antibody Screen: NEGATIVE

## 2020-08-09 LAB — CBC WITH DIFFERENTIAL/PLATELET
Abs Immature Granulocytes: 0.01 10*3/uL (ref 0.00–0.07)
Basophils Absolute: 0.1 10*3/uL (ref 0.0–0.1)
Basophils Relative: 2 %
Eosinophils Absolute: 0.4 10*3/uL (ref 0.0–0.5)
Eosinophils Relative: 7 %
HCT: 44.5 % (ref 39.0–52.0)
Hemoglobin: 14.1 g/dL (ref 13.0–17.0)
Immature Granulocytes: 0 %
Lymphocytes Relative: 15 %
Lymphs Abs: 1 10*3/uL (ref 0.7–4.0)
MCH: 28.3 pg (ref 26.0–34.0)
MCHC: 31.7 g/dL (ref 30.0–36.0)
MCV: 89.2 fL (ref 80.0–100.0)
Monocytes Absolute: 0.6 10*3/uL (ref 0.1–1.0)
Monocytes Relative: 9 %
Neutro Abs: 4.6 10*3/uL (ref 1.7–7.7)
Neutrophils Relative %: 67 %
Platelets: 198 10*3/uL (ref 150–400)
RBC: 4.99 MIL/uL (ref 4.22–5.81)
RDW: 15.5 % (ref 11.5–15.5)
WBC: 6.8 10*3/uL (ref 4.0–10.5)
nRBC: 0 % (ref 0.0–0.2)

## 2020-08-09 LAB — BASIC METABOLIC PANEL
Anion gap: 16 — ABNORMAL HIGH (ref 5–15)
BUN: 42 mg/dL — ABNORMAL HIGH (ref 6–20)
CO2: 24 mmol/L (ref 22–32)
Calcium: 7.7 mg/dL — ABNORMAL LOW (ref 8.9–10.3)
Chloride: 94 mmol/L — ABNORMAL LOW (ref 98–111)
Creatinine, Ser: 13.47 mg/dL — ABNORMAL HIGH (ref 0.61–1.24)
GFR, Estimated: 4 mL/min — ABNORMAL LOW (ref >60)
Glucose, Bld: 303 mg/dL — ABNORMAL HIGH (ref 70–99)
Potassium: 5.2 mmol/L — ABNORMAL HIGH (ref 3.5–5.1)
Sodium: 134 mmol/L — ABNORMAL LOW (ref 135–145)

## 2020-08-09 LAB — PROTIME-INR
INR: 1.1 (ref 0.8–1.2)
Prothrombin Time: 14.3 seconds (ref 11.4–15.2)

## 2020-08-09 LAB — ABO/RH: ABO/RH(D): O POS

## 2020-08-09 LAB — GLUCOSE, CAPILLARY: Glucose-Capillary: 238 mg/dL — ABNORMAL HIGH (ref 70–99)

## 2020-08-09 LAB — APTT: aPTT: 32 seconds (ref 24–36)

## 2020-08-09 SURGERY — REVISON OF ARTERIOVENOUS FISTULA
Anesthesia: General | Site: Neck | Laterality: Right

## 2020-08-09 MED ORDER — ROPIVACAINE HCL 5 MG/ML IJ SOLN
INTRAMUSCULAR | Status: DC | PRN
  Administered 2020-08-09: 25 mL via PERINEURAL

## 2020-08-09 MED ORDER — ONDANSETRON HCL 4 MG/2ML IJ SOLN
4.0000 mg | Freq: Four times a day (QID) | INTRAMUSCULAR | Status: DC | PRN
  Administered 2020-08-09: 4 mg via INTRAVENOUS

## 2020-08-09 MED ORDER — CEFAZOLIN SODIUM-DEXTROSE 1-4 GM/50ML-% IV SOLN
1.0000 g | INTRAVENOUS | Status: AC
  Administered 2020-08-09: 1 g via INTRAVENOUS

## 2020-08-09 MED ORDER — HYDROCODONE-ACETAMINOPHEN 5-325 MG PO TABS: 1.0000 | ORAL_TABLET | Freq: Four times a day (QID) | ORAL | 0 refills | Status: DC | PRN

## 2020-08-09 MED ORDER — SODIUM CHLORIDE 0.9 % IV SOLN
INTRAVENOUS | Status: DC | PRN
  Administered 2020-08-09: 30 ug/min via INTRAVENOUS

## 2020-08-09 MED ORDER — MIDAZOLAM HCL 2 MG/2ML IJ SOLN
INTRAMUSCULAR | Status: AC
  Filled 2020-08-09: qty 2

## 2020-08-09 MED ORDER — MIDAZOLAM HCL 2 MG/2ML IJ SOLN
0.5000 mg | INTRAMUSCULAR | Status: AC | PRN
  Administered 2020-08-09: 0.5 mg via INTRAVENOUS

## 2020-08-09 MED ORDER — CHLORHEXIDINE GLUCONATE 0.12 % MT SOLN: 15.0000 mL | Freq: Once | OROMUCOSAL | Status: AC

## 2020-08-09 MED ORDER — HEPARIN SODIUM (PORCINE) 5000 UNIT/ML IJ SOLN
INTRAMUSCULAR | Status: AC
  Filled 2020-08-09: qty 1

## 2020-08-09 MED ORDER — ONDANSETRON HCL 4 MG/2ML IJ SOLN: 4.0000 mg | Freq: Once | INTRAMUSCULAR | Status: DC | PRN

## 2020-08-09 MED ORDER — PROPOFOL 10 MG/ML IV BOLUS
INTRAVENOUS | Status: DC | PRN
  Administered 2020-08-09: 200 mg via INTRAVENOUS

## 2020-08-09 MED ORDER — CHLORHEXIDINE GLUCONATE 0.12 % MT SOLN
OROMUCOSAL | Status: AC
  Administered 2020-08-09: 15 mL via OROMUCOSAL
  Filled 2020-08-09: qty 15

## 2020-08-09 MED ORDER — SODIUM CHLORIDE 0.9 % IV SOLN
INTRAVENOUS | Status: DC | PRN
  Administered 2020-08-09: 501 mL via INTRAMUSCULAR

## 2020-08-09 MED ORDER — "VISTASEAL 4 ML SINGLE DOSE KIT "
PACK | CUTANEOUS | Status: DC | PRN
  Administered 2020-08-09: 4 mL via TOPICAL

## 2020-08-09 MED ORDER — FAMOTIDINE 20 MG PO TABS
ORAL_TABLET | ORAL | Status: AC
  Administered 2020-08-09: 20 mg via ORAL
  Filled 2020-08-09: qty 1

## 2020-08-09 MED ORDER — INSULIN ASPART 100 UNIT/ML ~~LOC~~ SOLN
SUBCUTANEOUS | Status: AC
  Administered 2020-08-09: 6 [IU]
  Filled 2020-08-09: qty 1

## 2020-08-09 MED ORDER — OXYCODONE HCL 5 MG PO TABS
5.0000 mg | ORAL_TABLET | Freq: Once | ORAL | Status: AC | PRN
  Administered 2020-08-09: 5 mg via ORAL

## 2020-08-09 MED ORDER — DEXAMETHASONE SODIUM PHOSPHATE 10 MG/ML IJ SOLN
INTRAMUSCULAR | Status: AC
  Filled 2020-08-09: qty 1

## 2020-08-09 MED ORDER — ACETAMINOPHEN 160 MG/5ML PO SOLN
325.0000 mg | ORAL | Status: DC | PRN
  Filled 2020-08-09: qty 20.3

## 2020-08-09 MED ORDER — FENTANYL CITRATE (PF) 100 MCG/2ML IJ SOLN
INTRAMUSCULAR | Status: AC
  Administered 2020-08-09: 50 ug via INTRAVENOUS
  Filled 2020-08-09: qty 2

## 2020-08-09 MED ORDER — LIDOCAINE HCL (PF) 2 % IJ SOLN
INTRAMUSCULAR | Status: AC
  Filled 2020-08-09: qty 5

## 2020-08-09 MED ORDER — OXYCODONE HCL 5 MG PO TABS
ORAL_TABLET | ORAL | Status: AC
  Filled 2020-08-09: qty 1

## 2020-08-09 MED ORDER — HYDROCODONE-ACETAMINOPHEN 7.5-325 MG PO TABS: 1.0000 | ORAL_TABLET | Freq: Once | ORAL | Status: DC | PRN

## 2020-08-09 MED ORDER — SODIUM CHLORIDE FLUSH 0.9 % IV SOLN
INTRAVENOUS | Status: AC
  Filled 2020-08-09: qty 10

## 2020-08-09 MED ORDER — FENTANYL CITRATE (PF) 100 MCG/2ML IJ SOLN
INTRAMUSCULAR | Status: DC | PRN
  Administered 2020-08-09 (×2): 25 ug via INTRAVENOUS

## 2020-08-09 MED ORDER — FENTANYL CITRATE (PF) 100 MCG/2ML IJ SOLN
50.0000 ug | INTRAMUSCULAR | Status: AC | PRN
  Administered 2020-08-09: 50 ug via INTRAVENOUS

## 2020-08-09 MED ORDER — CHLORHEXIDINE GLUCONATE CLOTH 2 % EX PADS
6.0000 | MEDICATED_PAD | Freq: Once | CUTANEOUS | Status: AC
  Administered 2020-08-09: 6 via TOPICAL

## 2020-08-09 MED ORDER — FENTANYL CITRATE (PF) 100 MCG/2ML IJ SOLN: 25.0000 ug | INTRAMUSCULAR | Status: DC | PRN

## 2020-08-09 MED ORDER — HEPARIN SODIUM (PORCINE) 1000 UNIT/ML IJ SOLN
INTRAMUSCULAR | Status: DC | PRN
  Administered 2020-08-09: 3000 [IU] via INTRAVENOUS

## 2020-08-09 MED ORDER — BUPIVACAINE-EPINEPHRINE (PF) 0.5% -1:200000 IJ SOLN
INTRAMUSCULAR | Status: AC
  Filled 2020-08-09: qty 30

## 2020-08-09 MED ORDER — VASOPRESSIN 20 UNIT/ML IV SOLN
INTRAVENOUS | Status: DC | PRN
  Administered 2020-08-09: 1.5 [IU] via INTRAVENOUS
  Administered 2020-08-09 (×2): 1 [IU] via INTRAVENOUS
  Administered 2020-08-09: 2 [IU] via INTRAVENOUS

## 2020-08-09 MED ORDER — DEXMEDETOMIDINE (PRECEDEX) IN NS 20 MCG/5ML (4 MCG/ML) IV SYRINGE
PREFILLED_SYRINGE | INTRAVENOUS | Status: DC | PRN
  Administered 2020-08-09 (×2): 4 ug via INTRAVENOUS

## 2020-08-09 MED ORDER — HYDROMORPHONE HCL 1 MG/ML IJ SOLN: 1.0000 mg | Freq: Once | INTRAMUSCULAR | Status: DC | PRN

## 2020-08-09 MED ORDER — PROPOFOL 10 MG/ML IV BOLUS
INTRAVENOUS | Status: AC
  Filled 2020-08-09: qty 20

## 2020-08-09 MED ORDER — INSULIN ASPART 100 UNIT/ML ~~LOC~~ SOLN: 6.0000 [IU] | Freq: Once | SUBCUTANEOUS | Status: AC

## 2020-08-09 MED ORDER — PHENYLEPHRINE HCL (PRESSORS) 10 MG/ML IV SOLN
INTRAVENOUS | Status: DC | PRN
  Administered 2020-08-09: 200 ug via INTRAVENOUS
  Administered 2020-08-09: 150 ug via INTRAVENOUS
  Administered 2020-08-09: 100 ug via INTRAVENOUS
  Administered 2020-08-09: 200 ug via INTRAVENOUS
  Administered 2020-08-09: 100 ug via INTRAVENOUS
  Administered 2020-08-09: 150 ug via INTRAVENOUS

## 2020-08-09 MED ORDER — SODIUM CHLORIDE 0.9 % IV SOLN: INTRAVENOUS | Status: DC

## 2020-08-09 MED ORDER — LACTATED RINGERS IV SOLN: INTRAVENOUS | Status: DC

## 2020-08-09 MED ORDER — LIDOCAINE HCL (CARDIAC) PF 100 MG/5ML IV SOSY
PREFILLED_SYRINGE | INTRAVENOUS | Status: DC | PRN
  Administered 2020-08-09: 100 mg via INTRAVENOUS

## 2020-08-09 MED ORDER — ROPIVACAINE HCL 5 MG/ML IJ SOLN
INTRAMUSCULAR | Status: AC
  Filled 2020-08-09: qty 30

## 2020-08-09 MED ORDER — ORAL CARE MOUTH RINSE: 15.0000 mL | Freq: Once | OROMUCOSAL | Status: AC

## 2020-08-09 MED ORDER — FENTANYL CITRATE (PF) 100 MCG/2ML IJ SOLN
INTRAMUSCULAR | Status: AC
  Filled 2020-08-09: qty 2

## 2020-08-09 MED ORDER — MIDAZOLAM HCL 2 MG/2ML IJ SOLN
INTRAMUSCULAR | Status: AC
  Administered 2020-08-09: 0.5 mg via INTRAVENOUS
  Filled 2020-08-09: qty 2

## 2020-08-09 MED ORDER — ACETAMINOPHEN 10 MG/ML IV SOLN: 1000.0000 mg | Freq: Once | INTRAVENOUS | Status: DC | PRN

## 2020-08-09 MED ORDER — EPINEPHRINE PF 1 MG/ML IJ SOLN
INTRAMUSCULAR | Status: AC
  Filled 2020-08-09: qty 1

## 2020-08-09 MED ORDER — FENTANYL CITRATE (PF) 100 MCG/2ML IJ SOLN
INTRAMUSCULAR | Status: AC
  Administered 2020-08-09: 25 ug via INTRAVENOUS
  Filled 2020-08-09: qty 2

## 2020-08-09 MED ORDER — CEFAZOLIN SODIUM-DEXTROSE 1-4 GM/50ML-% IV SOLN
INTRAVENOUS | Status: AC
  Filled 2020-08-09: qty 50

## 2020-08-09 MED ORDER — ACETAMINOPHEN 325 MG PO TABS: 325.0000 mg | ORAL_TABLET | ORAL | Status: DC | PRN

## 2020-08-09 MED ORDER — FAMOTIDINE 20 MG PO TABS: 20.0000 mg | ORAL_TABLET | Freq: Once | ORAL | Status: AC

## 2020-08-09 MED ORDER — OXYCODONE HCL 5 MG/5ML PO SOLN
5.0000 mg | Freq: Once | ORAL | Status: AC | PRN
Start: 2020-08-09 — End: 2020-08-09

## 2020-08-09 SURGICAL SUPPLY — 64 items
ADH SKN CLS APL DERMABOND .7 (GAUZE/BANDAGES/DRESSINGS) ×2
APL PRP STRL LF DISP 70% ISPRP (MISCELLANEOUS) ×2
BAG DECANTER FOR FLEXI CONT (MISCELLANEOUS) ×3 IMPLANT
BLADE SURG SZ11 CARB STEEL (BLADE) ×3 IMPLANT
BOOT SUTURE AID YELLOW STND (SUTURE) ×3 IMPLANT
BRUSH SCRUB EZ  4% CHG (MISCELLANEOUS) ×1
BRUSH SCRUB EZ 4% CHG (MISCELLANEOUS) ×2 IMPLANT
CANISTER SUCT 1200ML W/VALVE (MISCELLANEOUS) ×3 IMPLANT
CATH CANNON HEMO 15FR 19 (HEMODIALYSIS SUPPLIES) ×3 IMPLANT
CHLORAPREP W/TINT 26 (MISCELLANEOUS) ×3 IMPLANT
CLIP SPRNG 6MM S-JAW DBL (CLIP) ×3
COVER WAND RF STERILE (DRAPES) ×3 IMPLANT
DECANTER SPIKE VIAL GLASS SM (MISCELLANEOUS) ×3 IMPLANT
DERMABOND ADVANCED (GAUZE/BANDAGES/DRESSINGS) ×1
DERMABOND ADVANCED .7 DNX12 (GAUZE/BANDAGES/DRESSINGS) ×2 IMPLANT
ELECT CAUTERY BLADE 6.4 (BLADE) ×3 IMPLANT
ELECT REM PT RETURN 9FT ADLT (ELECTROSURGICAL) ×3
ELECTRODE REM PT RTRN 9FT ADLT (ELECTROSURGICAL) ×2 IMPLANT
GAUZE 4X4 16PLY RFD (DISPOSABLE) ×3 IMPLANT
GLIDEWIRE ADV .035X180CM (WIRE) ×3 IMPLANT
GLOVE SURG ENC MOIS LTX SZ7 (GLOVE) ×3 IMPLANT
GLOVE SURG SYN 7.0 (GLOVE) ×3 IMPLANT
GLOVE SURG UNDER LTX SZ7.5 (GLOVE) ×3 IMPLANT
GOWN STRL REUS W/ TWL LRG LVL3 (GOWN DISPOSABLE) ×2 IMPLANT
GOWN STRL REUS W/ TWL XL LVL3 (GOWN DISPOSABLE) ×4 IMPLANT
GOWN STRL REUS W/TWL LRG LVL3 (GOWN DISPOSABLE) ×3
GOWN STRL REUS W/TWL XL LVL3 (GOWN DISPOSABLE) ×6
GRAFT COLLAGEN VASCULAR 8X45 (Miscellaneous) ×3 IMPLANT
HEMOSTAT SURGICEL 2X3 (HEMOSTASIS) ×3 IMPLANT
IV NS 500ML (IV SOLUTION) ×3
IV NS 500ML BAXH (IV SOLUTION) ×2 IMPLANT
KIT TURNOVER KIT A (KITS) ×3 IMPLANT
LABEL OR SOLS (LABEL) ×3 IMPLANT
LOOP RED MAXI  1X406MM (MISCELLANEOUS) ×1
LOOP VESSEL MAXI 1X406 RED (MISCELLANEOUS) ×2 IMPLANT
LOOP VESSEL MINI 0.8X406 BLUE (MISCELLANEOUS) ×2 IMPLANT
LOOPS BLUE MINI 0.8X406MM (MISCELLANEOUS) ×1
MANIFOLD NEPTUNE II (INSTRUMENTS) ×3 IMPLANT
NEEDLE FILTER BLUNT 18X 1/2SAF (NEEDLE) ×1
NEEDLE FILTER BLUNT 18X1 1/2 (NEEDLE) ×2 IMPLANT
NS IRRIG 500ML POUR BTL (IV SOLUTION) ×3 IMPLANT
PACK EXTREMITY ARMC (MISCELLANEOUS) ×3 IMPLANT
PAD PREP 24X41 OB/GYN DISP (PERSONAL CARE ITEMS) ×3 IMPLANT
SOLUTION CELL SAVER (CLIP) ×2 IMPLANT
STOCKINETTE STRL 4IN 9604848 (GAUZE/BANDAGES/DRESSINGS) ×3 IMPLANT
SUT GORETEX CV-6TTC-13 36IN (SUTURE) IMPLANT
SUT MNCRL AB 4-0 PS2 18 (SUTURE) ×3 IMPLANT
SUT PROLENE 5-0 (SUTURE) ×9
SUT PROLENE 5-0 BB 36X2 ARM (SUTURE) ×6
SUT PROLENE 6 0 BV (SUTURE) ×21 IMPLANT
SUT SILK 2 0 (SUTURE) ×3
SUT SILK 2 0 SH (SUTURE) ×3 IMPLANT
SUT SILK 2-0 18XBRD TIE 12 (SUTURE) ×2 IMPLANT
SUT SILK 3 0 (SUTURE) ×3
SUT SILK 3-0 18XBRD TIE 12 (SUTURE) ×2 IMPLANT
SUT SILK 4 0 (SUTURE) ×3
SUT SILK 4-0 18XBRD TIE 12 (SUTURE) ×2 IMPLANT
SUT VIC AB 3-0 SH 27 (SUTURE) ×3
SUT VIC AB 3-0 SH 27X BRD (SUTURE) ×2 IMPLANT
SUTURE PROLEN 5-0 BB 36X2 ARM (SUTURE) ×6 IMPLANT
SYR 20ML LL LF (SYRINGE) ×3 IMPLANT
SYR 3ML LL SCALE MARK (SYRINGE) ×3 IMPLANT
SYR TOOMEY IRRIG 70ML (MISCELLANEOUS)
SYRINGE TOOMEY IRRIG 70ML (MISCELLANEOUS) IMPLANT

## 2020-08-09 NOTE — Anesthesia Preprocedure Evaluation (Addendum)
Anesthesia Evaluation  Patient identified by MRN, date of birth, ID band Patient awake    Reviewed: Allergy & Precautions, H&P , NPO status , reviewed documented beta blocker date and time   Airway Mallampati: III  TM Distance: >3 FB Neck ROM: full    Dental  (+) Poor Dentition, Chipped, Missing Very poor dentition, few remaining teeth, denies loose teeth:   Pulmonary shortness of breath and with exertion,  States only when fluid overload, presently doing well   Pulmonary exam normal        Cardiovascular Exercise Tolerance: Poor hypertension, + CAD and +CHF  Normal cardiovascular exam  06/2015 ECHO Study Conclusions   - Left ventricle: The cavity size was normal. Wall thickness was  normal. Systolic function was normal. The estimated ejection  fraction was 60%. Wall motion was normal; there were no regional  wall motion abnormalities.  - Aortic valve: There was trivial regurgitation.  - Mitral valve: There was mild regurgitation.  - Left atrium: The atrium was dilated. No evidence of thrombus in  the atrial cavity or appendage.  - Right atrium: No evidence of thrombus in the appendage. No  evidence of thrombus in the atrial cavity or appendage.  - Atrial septum: No defect or patent foramen ovale was identified.    Neuro/Psych    GI/Hepatic GERD  Medicated and Controlled,  Endo/Other  diabetes  Renal/GU ESRF and DialysisRenal disease     Musculoskeletal   Abdominal   Peds  Hematology   Anesthesia Other Findings Past Medical History: No date: CHF (congestive heart failure) (HCC) No date: Coronary artery disease No date: Diabetes mellitus without complication (HCC) No date: Dialysis patient [facility])     Comment:  M-W-F No date: GERD (gastroesophageal reflux disease) No date: Hypertension 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 No date: Renal disorder     Comment:  on dialysis for 4 years three times a week last was                Tuesday No date: Secondary hyperparathyroidism of renal origin ([place]) No date: Shortness of breath dyspnea No date: Type 2 diabetes mellitus with diabetic nephropathy (HCC) No date: Type 2 diabetes mellitus with diabetic retinopathy ([place])  Past Surgical History: 10/01/2017: A/V FISTULAGRAM; Left     Comment:  Procedure: A/V FISTULAGRAM;  Surgeon: Algernon Huxley, MD;               Location: [place] [facility] LAB;  Service: Cardiovascular;              Laterality: Left; 05/17/2020: A/V FISTULAGRAM; Left     Comment:  Procedure: A/V FISTULAGRAM;  Surgeon: Algernon Huxley, MD;               Location: [place] [facility] LAB;  Service: Cardiovascular;              Laterality: Left; 06/30/2018: CATARACT EXTRACTION W/PHACO; Right     Comment:  Procedure: CATARACT EXTRACTION PHACO AND INTRAOCULAR               LENS PLACEMENT (IOC)-RIGHT;  Surgeon: Eulogio Bear,              MD;  Location: [facility] ORS;  Service: Ophthalmology;                Laterality: Right;  [place] 00:07.0 CDE 0.16 FLUID PACK lOT               # [id number] 12/24/2018: CATARACT EXTRACTION W/PHACO;  Left     Comment:  Procedure: CATARACT EXTRACTION PHACO AND INTRAOCULAR               LENS PLACEMENT (IOC);  Surgeon: Eulogio Bear, MD;                Location: [facility] ORS;  Service: Ophthalmology;  Laterality:              Left;  [place] 00:30.0 CDE 2.01 FLUID PACK LOT # [id number] 04/23/2017: COLONOSCOPY WITH PROPOFOL; N/A     Comment:  Procedure: COLONOSCOPY WITH PROPOFOL;  Surgeon:               [name], MD;  Location: [facility] ENDOSCOPY;                Service: Endoscopy;  Laterality: N/A; 07/02/2017: COLONOSCOPY WITH PROPOFOL; N/A     Comment:  Procedure: COLONOSCOPY WITH PROPOFOL;  Surgeon:               [name], MD;  Location: [facility] ENDOSCOPY;                Service: Endoscopy;  Laterality: N/A; 04/23/2017: ESOPHAGOGASTRODUODENOSCOPY (EGD) WITH PROPOFOL; N/A     Comment:  Procedure: ESOPHAGOGASTRODUODENOSCOPY  (EGD) WITH               PROPOFOL;  Surgeon: [name], MD;  Location:               [facility] ENDOSCOPY;  Service: Endoscopy;  Laterality: N/A; 03/27/2020: ESOPHAGOGASTRODUODENOSCOPY (EGD) WITH PROPOFOL; N/A     Comment:  Procedure: ESOPHAGOGASTRODUODENOSCOPY (EGD) WITH               PROPOFOL;  Surgeon: Lesly Rubenstein, MD;  Location:               [facility] ENDOSCOPY;  Service: Endoscopy;  Laterality: N/A; No date: EYE SURGERY     Comment:  RETINA 05/07/2015: PERIPHERAL VASCULAR CATHETERIZATION; N/A     Comment:  Procedure: A/V Shuntogram/Fistulagram;  Surgeon: Katha Cabal, MD;  Location: [place] CV LAB;  Service:              Cardiovascular;  Laterality: N/A; 05/07/2015: PERIPHERAL VASCULAR CATHETERIZATION; Left     Comment:  Procedure: A/V Shunt Intervention;  Surgeon: Katha Cabal, MD;  Location: [place] CV LAB;  Service:               Cardiovascular;  Laterality: Left; 07/16/2020: PERIPHERAL VASCULAR THROMBECTOMY; Left     Comment:  Procedure: PERIPHERAL VASCULAR THROMBECTOMY;  Surgeon:               Algernon Huxley, MD;  Location: [place] CV LAB;                Service: Cardiovascular;  Laterality: Left; 07/05/2015: TEE WITHOUT CARDIOVERSION; N/A     Comment:  Procedure: TRANSESOPHAGEAL ECHOCARDIOGRAM (TEE);                Surgeon: Dionisio David, MD;  Location: [facility] ORS;                Service: Cardiovascular;  Laterality: N/A; 06/24/2018: UPPER EXTREMITY ANGIOGRAPHY; Left     Comment:  Procedure: UPPER EXTREMITY ANGIOGRAPHY;  Surgeon: Lucky Cowboy,  Erskine Squibb, MD;  Location: [place] CV LAB;  Service:               Cardiovascular;  Laterality: Left;     Reproductive/Obstetrics                            Anesthesia Physical Anesthesia Plan  ASA: III  Anesthesia Plan: General   Post-op Pain Management:  Regional for Post-op pain   Induction: Intravenous  PONV Risk Score and Plan: 2 and  Ondansetron, Midazolam, Treatment may vary due to age or medical condition and Propofol infusion  Airway Management Planned: Nasal Cannula and Natural Airway  Additional Equipment:   Intra-op Plan:   Post-operative Plan:   Informed Consent: I have reviewed the patients History and Physical, chart, labs and discussed the procedure including the risks, benefits and alternatives for the proposed anesthesia with the patient or authorized representative who has indicated his/her understanding and acceptance.     Dental Advisory Given  Plan Discussed with: CRNA  Anesthesia Plan Comments:        Anesthesia Quick Evaluation

## 2020-08-09 NOTE — Progress Notes (Signed)
Pt CBG: 238. Dr. Bertell Maria notified. Acknowledged. No new orders at this time.

## 2020-08-09 NOTE — Anesthesia Procedure Notes (Signed)
Procedure Name: LMA Insertion Date/Time: 08/09/2020 1:12 PM Performed by: Allean Found, CRNA Pre-anesthesia Checklist: Patient identified, Patient being monitored, Timeout performed, Emergency Drugs available and Suction available Patient Re-evaluated:Patient Re-evaluated prior to induction Oxygen Delivery Method: Circle system utilized Preoxygenation: Pre-oxygenation with 100% oxygen Induction Type: IV induction Ventilation: Mask ventilation without difficulty LMA: LMA inserted LMA Size: 5.0 Tube type: Oral Number of attempts: 1 Placement Confirmation: positive ETCO2 and breath sounds checked- equal and bilateral Tube secured with: Tape Dental Injury: Teeth and Oropharynx as per pre-operative assessment

## 2020-08-09 NOTE — Anesthesia Procedure Notes (Signed)
Anesthesia Regional Block: Supraclavicular block   Pre-Anesthetic Checklist: ,, timeout performed, Correct Patient, Correct Site, Correct Laterality, Correct Procedure, Correct Position, site marked, Risks and benefits discussed,  Surgical consent,  Pre-op evaluation,  At surgeon's request and post-op pain management  Laterality: Upper and Left  Prep: chloraprep       Needles:  Injection technique: Single-shot  Needle Type: Echogenic Stimulator Needle     Needle Length: 10cm  Needle Gauge: 21   Needle insertion depth: 6 cm   Additional Needles:   Procedures: Doppler guided,,,, ultrasound used (permanent image in chart),,,,  Motor weakness within 10 minutes.  Narrative:  Start time: 08/09/2020 11:59 AM End time: 08/09/2020 12:03 PM Injection made incrementally with aspirations every 5 mL.  Performed by: Personally  Anesthesiologist: Alphonsus Sias, MD  Additional Notes: Functioning IV was confirmed and O2 [place]/monitors were applied. Light sedation administered as required, patient responsive throughout. A 146m 21ga EchoStim needle was used. Sterile prep and drape,hand hygiene and sterile gloves were used.  Negative aspiration and negative test dose prior to incremental administration of local anesthetic. 1% Lidocaine for skin wheal, 3 ml. Total LA: 23m- 0.5% Ropivicaine/epi 1:200/decadron 8 mg. U/S images stored in chart. The patient tolerated the procedure well.

## 2020-08-09 NOTE — Op Note (Signed)
[facility]   OPERATIVE NOTE   PROCEDURE: Jump graft revision  left brachiocephalic arteriovenous fistula with 7 mm Artegraft jump graft Ligation of aneurysmal left brachiocephalic AV fistula  PRE-OPERATIVE DIAGNOSIS: 1. aneurysmal degeneration of arteriovenous fistula  2. ESRD  POST-OPERATIVE DIAGNOSIS: same as above   SURGEON: [name], MD  ASSISTANT(S): None  ANESTHESIA: General  ESTIMATED BLOOD LOSS: 50 cc  FINDING(S): Left brachiocephalic AV fistula access site aneurysm Good, palpable thrill at end of the case  SPECIMEN(S): None  INDICATIONS:   [name] [name] is a 45 y.o. male who  presents with aneurysmal degeneration of left arm arteriovenous access.  In order to salvage the fistula and decrease the bleeding complication risks, I recommended a jump graft revision with ligation of the access.  Risk, benefits, and alternatives to access surgery were discussed.  The patient is aware the risks include but are not limited to: bleeding, infection, steal syndrome, nerve damage, ischemic monomelic neuropathy, loss of the access, need for additional procedures, death and stroke.  The patient agrees to proceed forward with the procedure.  DESCRIPTION: After obtaining full informed written consent, the patient was brought back to the operating room and placed supine upon the operating table.  The patient received IV antibiotics prior to induction.  After obtaining adequate anesthesia, the patient was prepped and draped in the standard fashion for the access procedure.  As incision was created near the arterial anastomosis prior to the aneurysmal segment.  The access was encircled with vessel loops and prepared for control.  I then created an incision in the proximal arm beyond the aneurysmal segment and encircled the access there for control with a vessel loop.  I then used the tunneller and tunnelled between the two incisions around the old access.  I brought a  7 mm Artegraft through the tunneller making sure to avoid twisting after marking for orientation.  The patient was then given 3000 units of intravenous heparin.  The access was then controlled and clamped and ligated distally with a silk suture ligature.  I prepared the end nearer the original arterial anastomosis for an anastomosis with the new 7 mm Artegraft.  The anastomosis was created in an end to end fashion with two 6-0 Prolene sutures in the typical fashion.  I then flushed through the new graft and prepared this for the distal anastomosis.  The access was then divided and ligated again with a silk suture ligature.  The distal end was then prepared for anastomosis with the new graft.  An anastomosis was then created with two 6-0 Prolene sutures in the usual fashion.  The graft was flushed and de-aired prior to release of control.  Patch sutures with 6-0 Prolene sutures were used as needed for control of bleeding.  Surgicel and Evicel topical hemostatic agents were placed and hemostasis was complete.  I then closed the wound with 3-0 Vicryl suture in the subcutaneous space and a 4-0 Monocryl suture was used to close the skin.  Dermabond was placed as a dressing.  The patient was then awakened from anesthesia and taken to the recovery room in stable condition having tolerated the procedure well.    COMPLICATIONS: none  CONDITION: stable  [name]  08/09/2020, 3:44 PM   This note was created with Dragon Medical transcription system. Any errors in dictation are purely unintentional.

## 2020-08-09 NOTE — Interval H&P Note (Signed)
History and Physical Interval Note:  08/09/2020 12:41 PM  [name] [name]  has presented today for surgery, with the diagnosis of ESRD.  The various methods of treatment have been discussed with the patient and family. After consideration of risks, benefits and other options for treatment, the patient has consented to  Procedure(s): REVISON OF ARTERIOVENOUS FISTULA (Left) INSERTION OF DIALYSIS CATHETER (N/A) as a surgical intervention.  The patient's history has been reviewed, patient examined, no change in status, stable for surgery.  I have reviewed the patient's chart and labs.  Questions were answered to the patient's satisfaction.     [name]

## 2020-08-09 NOTE — Op Note (Signed)
OPERATIVE NOTE    PRE-OPERATIVE DIAGNOSIS: 1. ESRD 2.  Aneurysmal left brachiocephalic AV fistula requiring surgical revision  POST-OPERATIVE DIAGNOSIS: same as above  PROCEDURE: 1. Ultrasound guidance for vascular access to the right internal jugular vein 2. Fluoroscopic guidance for placement of catheter 3. Right jugular venogram and superior venacavogram 4. Placement of a 19 cm tip to cuff tunneled hemodialysis catheter via the right internal jugular vein  SURGEON: [name], MD  ANESTHESIA: General  ESTIMATED BLOOD LOSS: 10 cc  FLUORO TIME: 1.5 minutes  CONTRAST: 5 cc  FINDING(S): 1.  Patent right internal jugular vein.  Large azygos vein coming off of the superior vena cava but superior vena cava, innominate vein, and jugular vein were all widely patent.  SPECIMEN(S):  None  INDICATIONS:   [name] [name] is a 45 y.o.male who presents with an aneurysmal access requiring surgical revision.  The patient needs long term dialysis access for their ESRD, and a Permcath is necessary.  Risks and benefits are discussed and informed consent is obtained.    DESCRIPTION: After obtaining full informed written consent, the patient was brought back to the vascular suited. The patient's right neck and chest were sterilely prepped and draped in a sterile surgical field was created. Moderate conscious sedation was administered during a face to face encounter with the patient throughout the procedure with my supervision of the RN administering medicines and monitoring the patient's vital signs, pulse oximetry, telemetry and mental status throughout from the start of the procedure until the patient was taken to the recovery room.  The right internal jugular vein was visualized with ultrasound and found to be patent. It was then accessed under direct ultrasound guidance and a permanent image was recorded. A wire was placed. After skin nick and dilatation, the peel-away sheath was placed over the  wire. I then turned my attention to an area under the clavicle. Approximately 1-2 fingerbreadths below the clavicle a small counterincision was created and tunneled from the subclavicular incision to the access site. Using fluoroscopic guidance, a 19 centimeter tip to cuff tunneled hemodialysis catheter was selected, and tunneled from the subclavicular incision to the access site. It was then placed through the peel-away sheath and the peel-away sheath was removed.  Initially, using fluoroscopic guidance the catheter Traversing medially and 7 going into the right atrium through the superior vena cava.  I pulled the catheter tip back to the right jugular vein and performed a venogram.  The jugular vein, innominate vein, and innominate vein were all widely patent although there was a large azygos vein that I was going out initially.  Using this as a roadmap, I was able to get the catheter in the right position.  Using fluoroscopic guidance the catheter tips were parked in the right atrium. The appropriate distal connectors were placed. It withdrew blood well and flushed easily with heparinized saline and a concentrated heparin solution was then placed. It was secured to the chest wall with 2 Prolene sutures. The access incision was closed single 4-0 Monocryl. A 4-0 Monocryl pursestring suture was placed around the exit site. Sterile dressings were placed. The patient tolerated the procedure well and was taken to the recovery room in stable condition.  COMPLICATIONS: None  CONDITION: Stable  [name], MD 08/09/2020 3:56 PM   This note was created with Dragon Medical transcription system. Any errors in dictation are purely unintentional.

## 2020-08-09 NOTE — Transfer of Care (Signed)
Immediate Anesthesia Transfer of Care Note  Patient: [name] [name]  Procedure(s) Performed: REVISON OF ARTERIOVENOUS FISTULA (Left Arm Upper) INSERTION OF DIALYSIS CATHETER (Right Neck)  Patient Location: PACU  Anesthesia Type:General  Level of Consciousness: sedated  Airway & Oxygen Therapy: Patient Spontanous Breathing and Patient connected to face mask oxygen  Post-op Assessment: Report given to RN and Post -op Vital signs reviewed and stable  Post vital signs: Reviewed and stable  Last Vitals:  Vitals Value Taken Time  BP 87/42 08/09/20 1600  Temp    Pulse 77 08/09/20 1601  Resp 18 08/09/20 1601  SpO2 100 % 08/09/20 1601  Vitals shown include unvalidated device data.  Last Pain:  Vitals:   08/09/20 1045  TempSrc: Temporal  PainSc: 0-No pain         Complications: No complications documented.

## 2020-08-09 NOTE — Discharge Instructions (Signed)

## 2020-08-09 NOTE — Anesthesia Postprocedure Evaluation (Signed)
Anesthesia Post Note  Patient: [name] [name]  Procedure(s) Performed: REVISON OF ARTERIOVENOUS FISTULA (Left Arm Upper) INSERTION OF DIALYSIS CATHETER (Right Neck)  Patient location during evaluation: PACU Anesthesia Type: General Level of consciousness: awake and alert Pain management: pain level controlled Vital Signs Assessment: post-procedure vital signs reviewed and stable Respiratory status: spontaneous breathing, nonlabored ventilation, respiratory function stable and patient connected to nasal cannula oxygen Cardiovascular status: blood pressure returned to baseline and stable Postop Assessment: no apparent nausea or vomiting Anesthetic complications: no   No complications documented.   Last Vitals:  Vitals:   08/09/20 1700 08/09/20 1721  BP: (!) 137/95 137/77  Pulse: 75 77  Resp: 16 16  Temp: 36.8 C (!) 36.1 C  SpO2: 100% 96%    Last Pain:  Vitals:   08/09/20 1721  TempSrc: Oral  PainSc: 3                  Arita Miss

## 2020-08-10 ENCOUNTER — Encounter: Payer: Self-pay | Admitting: Vascular Surgery

## 2020-08-13 ENCOUNTER — Encounter: Payer: Self-pay | Admitting: Vascular Surgery

## 2020-08-14 ENCOUNTER — Ambulatory Visit (INDEPENDENT_AMBULATORY_CARE_PROVIDER_SITE_OTHER): Payer: Medicare Other | Admitting: Vascular Surgery

## 2020-08-16 ENCOUNTER — Ambulatory Visit (INDEPENDENT_AMBULATORY_CARE_PROVIDER_SITE_OTHER): Payer: Medicare Other | Admitting: Nurse Practitioner

## 2020-08-16 ENCOUNTER — Encounter (INDEPENDENT_AMBULATORY_CARE_PROVIDER_SITE_OTHER): Payer: Medicare Other

## 2020-08-22 ENCOUNTER — Other Ambulatory Visit (INDEPENDENT_AMBULATORY_CARE_PROVIDER_SITE_OTHER): Payer: Self-pay | Admitting: Vascular Surgery

## 2020-08-22 DIAGNOSIS — N186 End stage renal disease: Secondary | ICD-10-CM

## 2020-08-22 DIAGNOSIS — Z9862 Peripheral vascular angioplasty status: Secondary | ICD-10-CM

## 2020-08-23 ENCOUNTER — Ambulatory Visit (INDEPENDENT_AMBULATORY_CARE_PROVIDER_SITE_OTHER): Payer: Medicare Other | Admitting: Nurse Practitioner

## 2020-08-23 ENCOUNTER — Telehealth (INDEPENDENT_AMBULATORY_CARE_PROVIDER_SITE_OTHER): Payer: Self-pay

## 2020-08-23 ENCOUNTER — Encounter (INDEPENDENT_AMBULATORY_CARE_PROVIDER_SITE_OTHER): Payer: Self-pay | Admitting: Nurse Practitioner

## 2020-08-23 ENCOUNTER — Ambulatory Visit (INDEPENDENT_AMBULATORY_CARE_PROVIDER_SITE_OTHER): Payer: Medicare Other

## 2020-08-23 ENCOUNTER — Other Ambulatory Visit: Payer: Self-pay

## 2020-08-23 VITALS — BP 136/82 | HR 79 | Ht 72.0 in | Wt 283.0 lb

## 2020-08-23 DIAGNOSIS — Z9862 Peripheral vascular angioplasty status: Secondary | ICD-10-CM | POA: Diagnosis not present

## 2020-08-23 DIAGNOSIS — N186 End stage renal disease: Secondary | ICD-10-CM

## 2020-08-23 DIAGNOSIS — I10 Essential (primary) hypertension: Secondary | ICD-10-CM

## 2020-08-23 DIAGNOSIS — E1165 Type 2 diabetes mellitus with hyperglycemia: Secondary | ICD-10-CM

## 2020-08-23 DIAGNOSIS — Z794 Long term (current) use of insulin: Secondary | ICD-10-CM

## 2020-08-23 MED ORDER — OXYCODONE-ACETAMINOPHEN 5-325 MG PO TABS: 1.0000 | ORAL_TABLET | Freq: Four times a day (QID) | ORAL | 0 refills | Status: DC | PRN

## 2020-08-23 MED ORDER — MUPIROCIN 2 % EX OINT: 1.0000 "application " | TOPICAL_OINTMENT | Freq: Two times a day (BID) | CUTANEOUS | 1 refills | Status: DC

## 2020-08-23 NOTE — Progress Notes (Signed)
Subjective:    Patient ID: [name] [name], male    DOB: 03-19-1976, 45 y.o.   MRN: [id number] Chief Complaint  Patient presents with  . Follow-up    2 week follow up arterio fistula    [name] [name] is a 45 year old male that presents today following recent intervention of his left brachiocephalic AV fistula.  The patient's fistula became aneurysmal and subsequently had resection of the aneurysms with insertion of Artegraft on 08/09/2020.  The patient notes that he has had bruising and discomfort since his surgery.  The patient has been preparing for her PermCath since the surgery.  He reports having an increase in itching however at the PermCath works well.  He denies any fever or chills.  He denies any TIA-like symptoms.  He does note discomfort.  There is a small area that is slow to heal near the distal portion of the incision.  There is no drainage or foul-smelling odor from that portion  Today noninvasive studies show an occluded brachiocephalic AV fistula using Artegraft.   Review of Systems  Hematological: Bruises/bleeds easily.  All other systems reviewed and are negative.      Objective:   Physical Exam Vitals reviewed.  HENT:     Head: Normocephalic.  Cardiovascular:     Rate and Rhythm: Normal rate.     Pulses:          Radial pulses are 2+ on the left side.     Arteriovenous access: left arteriovenous access is present.    Comments: Thrill or bruit Pulmonary:     Effort: Pulmonary effort is normal.  Neurological:     Mental Status: He is alert and oriented to person, place, and time.  Psychiatric:        Mood and Affect: Mood normal.        Behavior: Behavior normal.        Thought Content: Thought content normal.        Judgment: Judgment normal.     BP 136/82   Pulse 79   Ht 6' (1.829 m)   Wt 283 lb (128.4 kg)   BMI 38.38 kg/m   Past Medical History:  Diagnosis Date  . CHF (congestive heart failure) ([place])   . Coronary artery disease   . Diabetes  mellitus without complication ([place])   . Dialysis patient [facility])    M-W-F  . GERD (gastroesophageal reflux disease)   . Hypertension   . Kyrle's disease   . Renal disorder    on dialysis for 4 years three times a week last was Tuesday  . Secondary hyperparathyroidism of renal origin ([place])   . Shortness of breath dyspnea   . Type 2 diabetes mellitus with diabetic nephropathy ([place])   . Type 2 diabetes mellitus with diabetic retinopathy ([place])     Social History   Socioeconomic History  . Marital status: Single    Spouse name: Not on file  . Number of children: Not on file  . Years of education: Not on file  . Highest education level: Not on file  Occupational History  . Not on file  Tobacco Use  . Smoking status: Never Smoker  . Smokeless tobacco: Never Used  Vaping Use  . Vaping Use: Never used  Substance and Sexual Activity  . Alcohol use: Yes    Comment: occ. shot  . Drug use: No  . Sexual activity: Not on file  Other Topics Concern  . Not on file  Social History  Narrative   Lives by himself   Social Determinants of Health   Financial Resource Strain: Not on file  Food Insecurity: Not on file  Transportation Needs: Not on file  Physical Activity: Not on file  Stress: Not on file  Social Connections: Not on file  Intimate Partner Violence: Not on file    Past Surgical History:  Procedure Laterality Date  . A/V FISTULAGRAM Left 10/01/2017   Procedure: A/V FISTULAGRAM;  Surgeon: Algernon Huxley, MD;  Location: [place] [facility];  Service: Cardiovascular;  Laterality: Left;  . A/V FISTULAGRAM Left 05/17/2020   Procedure: A/V FISTULAGRAM;  Surgeon: Algernon Huxley, MD;  Location: [place] [facility];  Service: Cardiovascular;  Laterality: Left;  . CATARACT EXTRACTION W/PHACO Right 06/30/2018   Procedure: CATARACT EXTRACTION PHACO AND INTRAOCULAR LENS PLACEMENT (IOC)-RIGHT;  Surgeon: Eulogio Bear, MD;  Location: [facility];  Service: Ophthalmology;  Laterality: Right;   [place] 00:07.0 CDE 0.16 FLUID PACK lOT # [id number]  . CATARACT EXTRACTION W/PHACO Left 12/24/2018   Procedure: CATARACT EXTRACTION PHACO AND INTRAOCULAR LENS PLACEMENT (IOC);  Surgeon: Eulogio Bear, MD;  Location: [facility];  Service: Ophthalmology;  Laterality: Left;  [place] 00:30.0 CDE 2.01 FLUID PACK LOT # [id number]  . COLONOSCOPY WITH PROPOFOL N/A 04/23/2017   Procedure: COLONOSCOPY WITH PROPOFOL;  Surgeon: [name], MD;  Location[facility];  Service: Endoscopy;  Laterality: N/A;  . COLONOSCOPY WITH PROPOFOL N/A 07/02/2017   Procedure: COLONOSCOPY WITH PROPOFOL;  Surgeon: [name], MD;  Location: [facility] ENDOSCOPY;  Service: Endoscopy;  Laterality: N/A;  . ESOPHAGOGASTRODUODENOSCOPY (EGD) WITH PROPOFOL N/A 04/23/2017   Procedure: ESOPHAGOGASTRODUODENOSCOPY (EGD) WITH PROPOFOL;  Surgeon: [name], MD;  Location: [facility] ENDOSCOPY;  Service: Endoscopy;  Laterality: N/A;  . ESOPHAGOGASTRODUODENOSCOPY (EGD) WITH PROPOFOL N/A 03/27/2020   Procedure: ESOPHAGOGASTRODUODENOSCOPY (EGD) WITH PROPOFOL;  Surgeon: Lesly Rubenstein, MD;  Location: [facility] ENDOSCOPY;  Service: Endoscopy;  Laterality: N/A;  . EYE SURGERY     RETINA  . INSERTION OF DIALYSIS CATHETER Right 08/09/2020   Procedure: INSERTION OF DIALYSIS CATHETER;  Surgeon: Algernon Huxley, MD;  Location: [facility];  Service: Vascular;  Laterality: Right;  . PERIPHERAL VASCULAR CATHETERIZATION N/A 05/07/2015   Procedure: A/V Shuntogram/Fistulagram;  Surgeon: Katha Cabal, MD;  Location: [place] [facility];  Service: Cardiovascular;  Laterality: N/A;  . PERIPHERAL VASCULAR CATHETERIZATION Left 05/07/2015   Procedure: A/V Shunt Intervention;  Surgeon: Katha Cabal, MD;  Location: [place] [facility];  Service: Cardiovascular;  Laterality: Left;  . PERIPHERAL VASCULAR THROMBECTOMY Left 07/16/2020   Procedure: PERIPHERAL VASCULAR THROMBECTOMY;  Surgeon: Algernon Huxley, MD;  Location: [place] [facility];  Service:  Cardiovascular;  Laterality: Left;  . REVISON OF ARTERIOVENOUS FISTULA Left 08/09/2020   Procedure: REVISON OF ARTERIOVENOUS FISTULA;  Surgeon: Algernon Huxley, MD;  Location: [facility];  Service: Vascular;  Laterality: Left;  . TEE WITHOUT CARDIOVERSION N/A 07/05/2015   Procedure: TRANSESOPHAGEAL ECHOCARDIOGRAM (TEE);  Surgeon: Dionisio David, MD;  Location: [facility];  Service: Cardiovascular;  Laterality: N/A;  . UPPER EXTREMITY ANGIOGRAPHY Left 06/24/2018   Procedure: UPPER EXTREMITY ANGIOGRAPHY;  Surgeon: Algernon Huxley, MD;  Location: [place] [facility];  Service: Cardiovascular;  Laterality: Left;    History reviewed. No pertinent family history.  Allergies  Allergen Reactions  . Cephalexin Nausea Only  . Ivp Dye [Iodinated Diagnostic Agents] Diarrhea  . Lisinopril Other (See Comments)    Pt cannot recall reaction, cough  .  Other     CBC Latest Ref Rng & Units 08/09/2020 12/24/2018 06/30/2018  WBC 4.0 - 10.5 K/uL 6.8 - -  Hemoglobin 13.0 - 17.0 g/dL 14.1 14.6 13.6  Hematocrit 39.0 - 52.0 % 44.5 43.0 40.0  Platelets 150 - 400 K/uL 198 - -      CMP     Component Value Date/Time   NA 134 (L) 08/09/2020 1036   NA 138 02/24/2014 0359   K 5.2 (H) 08/09/2020 1036   K 3.5 02/24/2014 0359   CL 94 (L) 08/09/2020 1036   CL 97 (L) 02/24/2014 0359   CO2 24 08/09/2020 1036   CO2 31 02/24/2014 0359   GLUCOSE 303 (H) 08/09/2020 1036   GLUCOSE 231 (H) 02/24/2014 0359   BUN 42 (H) 08/09/2020 1036   BUN 38 (H) 02/24/2014 0359   CREATININE 13.47 (H) 08/09/2020 1036   CREATININE 12.04 (H) 02/24/2014 0359   CALCIUM 7.7 (L) 08/09/2020 1036   CALCIUM 7.9 (L) 02/24/2014 0359   PROT 7.9 05/07/2015 0937   PROT 7.3 02/22/2014 1526   ALBUMIN 4.0 05/07/2015 0937   ALBUMIN 3.2 (L) 02/22/2014 1526   AST 14 (L) 05/07/2015 0937   AST 27 02/22/2014 1526   ALT 13 (L) 05/07/2015 0937   ALT 17 02/22/2014 1526   ALKPHOS 77 05/07/2015 0937   ALKPHOS 73 02/22/2014 1526   BILITOT 0.5 05/07/2015 0937    BILITOT 0.8 02/22/2014 1526   GFRNONAA 4 (L) 08/09/2020 1036   GFRNONAA 5 (L) 02/24/2014 0359   GFRAA 3 (L) 05/07/2015 0937   GFRAA 6 (L) 02/24/2014 0359     No results found.     Assessment & Plan:   1. ESRD (end stage renal disease) ([place])  Patient is instructed to use enforcing ointment on open area of incision.  Recommend:  The patient is experiencing increasing problems with their dialysis access.  Patient should have a fistulagram with the intention for intervention.  The intention for intervention is to restore appropriate flow and prevent thrombosis and possible loss of the access.  As well as improve the quality of dialysis therapy.  The risks, benefits and alternative therapies were reviewed in detail with the patient.  All questions were answered.  The patient agrees to proceed with angio/intervention.      2. Uncontrolled type 2 diabetes mellitus with hyperglycemia, with long-term current use of insulin (HCC) Continue hypoglycemic medications as already ordered, these medications have been reviewed and there are no changes at this time.  Hgb A1C to be monitored as already arranged by primary service   3. Primary hypertension Continue antihypertensive medications as already ordered, these medications have been reviewed and there are no changes at this time.    Current Outpatient Medications on File Prior to Visit  Medication Sig Dispense Refill  . aspirin EC 81 MG tablet Take 81 mg by mouth daily at 6 PM. (1700)    . carvedilol (COREG) 12.5 MG tablet Take 12.5 mg by mouth 2 (two) times daily with a meal.     . HUMULIN 70/30 (70-30) 100 UNIT/ML injection Inject 10-30 Units into the skin See admin instructions. 10 units before breakfast and 30 units before supper ON DIALYSIS DAYS (M-W-F), NO AM INSULIN    . lanthanum (FOSRENOL) 1000 MG chewable tablet Chew 2,000 mg by mouth 3 (three) times daily with meals. Takes as a powder    . loperamide (IMODIUM) 2 MG capsule  Take 2 mg by mouth as needed for diarrhea  or loose stools (Take 1/2-1 tablet as meeded to prevent diarrhea. Can take up to 1 tablet every 4 hours. Do not exceed 4 tablets in a day.).    Marland Kitchen losartan (COZAAR) 50 MG tablet Take 50 mg by mouth daily. (1700)    . omeprazole (PRILOSEC) 20 MG capsule Take by mouth.    . SENSIPAR 60 MG tablet Take 60 mg by mouth daily at 6 PM. 1700  3  . sevelamer carbonate (RENVELA) 2.4 g PACK     . calcium acetate (PHOSLO) 667 MG capsule Take by mouth in the morning and at bedtime. Takes in AM (Patient not taking: No sig reported)     No current facility-administered medications on file prior to visit.    There are no Patient Instructions on file for this visit. No follow-ups on file.   Kris Hartmann, NP

## 2020-08-23 NOTE — Telephone Encounter (Signed)
I attempted to contact the patient to schedule a left arm declot and a message was left for a return call.

## 2020-08-23 NOTE — H&P (View-Only) (Signed)
Subjective:    Patient ID: [name] [name], male    DOB: [birth year], 45 y.o.   MRN: [id number] Chief Complaint  Patient presents with  . Follow-up    2 week follow up arterio fistula    [name] [name] is a 45 year old male that presents today following recent intervention of his left brachiocephalic AV fistula.  The patient's fistula became aneurysmal and subsequently had resection of the aneurysms with insertion of Artegraft on 08/09/2020.  The patient notes that he has had bruising and discomfort since his surgery.  The patient has been preparing for her PermCath since the surgery.  He reports having an increase in itching however at the PermCath works well.  He denies any fever or chills.  He denies any TIA-like symptoms.  He does note discomfort.  There is a small area that is slow to heal near the distal portion of the incision.  There is no drainage or foul-smelling odor from that portion  Today noninvasive studies show an occluded brachiocephalic AV fistula using Artegraft.   Review of Systems  Hematological: Bruises/bleeds easily.  All other systems reviewed and are negative.      Objective:   Physical Exam Vitals reviewed.  HENT:     Head: Normocephalic.  Cardiovascular:     Rate and Rhythm: Normal rate.     Pulses:          Radial pulses are 2+ on the left side.     Arteriovenous access: left arteriovenous access is present.    Comments: Thrill or bruit Pulmonary:     Effort: Pulmonary effort is normal.  Neurological:     Mental Status: He is alert and oriented to person, place, and time.  Psychiatric:        Mood and Affect: Mood normal.        Behavior: Behavior normal.        Thought Content: Thought content normal.        Judgment: Judgment normal.     BP 136/82   Pulse 79   Ht 6' (1.829 m)   Wt 283 lb (128.4 kg)   BMI 38.38 kg/m   Past Medical History:  Diagnosis Date  . CHF (congestive heart failure) ([place])   . Coronary artery disease   . Diabetes  mellitus without complication ([place])   . Dialysis patient [facility])    M-W-F  . GERD (gastroesophageal reflux disease)   . Hypertension   . Kyrle's disease   . Renal disorder    on dialysis for 4 years three times a week last was Tuesday  . Secondary hyperparathyroidism of renal origin ([place])   . Shortness of breath dyspnea   . Type 2 diabetes mellitus with diabetic nephropathy ([place])   . Type 2 diabetes mellitus with diabetic retinopathy ([place])     Social History   Socioeconomic History  . Marital status: Single    Spouse name: Not on file  . Number of children: Not on file  . Years of education: Not on file  . Highest education level: Not on file  Occupational History  . Not on file  Tobacco Use  . Smoking status: Never Smoker  . Smokeless tobacco: Never Used  Vaping Use  . Vaping Use: Never used  Substance and Sexual Activity  . Alcohol use: Yes    Comment: occ. shot  . Drug use: No  . Sexual activity: Not on file  Other Topics Concern  . Not on file  Social History  Narrative   Lives by himself   Social Determinants of Health   Financial Resource Strain: Not on file  Food Insecurity: Not on file  Transportation Needs: Not on file  Physical Activity: Not on file  Stress: Not on file  Social Connections: Not on file  Intimate Partner Violence: Not on file    Past Surgical History:  Procedure Laterality Date  . A/V FISTULAGRAM Left 10/01/2017   Procedure: A/V FISTULAGRAM;  Surgeon: Algernon Huxley, MD;  Location: [place] [facility];  Service: Cardiovascular;  Laterality: Left;  . A/V FISTULAGRAM Left 05/17/2020   Procedure: A/V FISTULAGRAM;  Surgeon: Algernon Huxley, MD;  Location: [place] [facility];  Service: Cardiovascular;  Laterality: Left;  . CATARACT EXTRACTION W/PHACO Right 06/30/2018   Procedure: CATARACT EXTRACTION PHACO AND INTRAOCULAR LENS PLACEMENT (IOC)-RIGHT;  Surgeon: Eulogio Bear, MD;  Location: [facility];  Service: Ophthalmology;  Laterality: Right;   [place] 00:07.0 CDE 0.16 FLUID PACK lOT # [id number]  . CATARACT EXTRACTION W/PHACO Left 12/24/2018   Procedure: CATARACT EXTRACTION PHACO AND INTRAOCULAR LENS PLACEMENT (IOC);  Surgeon: Eulogio Bear, MD;  Location: [facility];  Service: Ophthalmology;  Laterality: Left;  [place] 00:30.0 CDE 2.01 FLUID PACK LOT # [id number]  . COLONOSCOPY WITH PROPOFOL N/A 04/23/2017   Procedure: COLONOSCOPY WITH PROPOFOL;  Surgeon: [name], MD;  Location: [facility] ENDOSCOPY;  Service: Endoscopy;  Laterality: N/A;  . COLONOSCOPY WITH PROPOFOL N/A 07/02/2017   Procedure: COLONOSCOPY WITH PROPOFOL;  Surgeon: [name], MD;  Location[facility];  Service: Endoscopy;  Laterality: N/A;  . ESOPHAGOGASTRODUODENOSCOPY (EGD) WITH PROPOFOL N/A 04/23/2017   Procedure: ESOPHAGOGASTRODUODENOSCOPY (EGD) WITH PROPOFOL;  Surgeon: [name], MD;  Location: [facility] ENDOSCOPY;  Service: Endoscopy;  Laterality: N/A;  . ESOPHAGOGASTRODUODENOSCOPY (EGD) WITH PROPOFOL N/A 03/27/2020   Procedure: ESOPHAGOGASTRODUODENOSCOPY (EGD) WITH PROPOFOL;  Surgeon: Lesly Rubenstein, MD;  Location: [facility] ENDOSCOPY;  Service: Endoscopy;  Laterality: N/A;  . EYE SURGERY     RETINA  . INSERTION OF DIALYSIS CATHETER Right 08/09/2020   Procedure: INSERTION OF DIALYSIS CATHETER;  Surgeon: Algernon Huxley, MD;  Location: [facility];  Service: Vascular;  Laterality: Right;  . PERIPHERAL VASCULAR CATHETERIZATION N/A 05/07/2015   Procedure: A/V Shuntogram/Fistulagram;  Surgeon: Katha Cabal, MD;  Location: [place] [facility];  Service: Cardiovascular;  Laterality: N/A;  . PERIPHERAL VASCULAR CATHETERIZATION Left 05/07/2015   Procedure: A/V Shunt Intervention;  Surgeon: Katha Cabal, MD;  Location: [place] [facility];  Service: Cardiovascular;  Laterality: Left;  . PERIPHERAL VASCULAR THROMBECTOMY Left 07/16/2020   Procedure: PERIPHERAL VASCULAR THROMBECTOMY;  Surgeon: Algernon Huxley, MD;  Location: [place] [facility];  Service:  Cardiovascular;  Laterality: Left;  . REVISON OF ARTERIOVENOUS FISTULA Left 08/09/2020   Procedure: REVISON OF ARTERIOVENOUS FISTULA;  Surgeon: Algernon Huxley, MD;  Location: [facility] OR[facility];  Service: Vascular;  Laterality: Left;  . TEE WITHOUT CARDIOVERSION N/A 07/05/2015   Procedure: TRANSESOPHAGEAL ECHOCARDIOGRAM (TEE);  Surgeon: Dionisio David, MD;  Location: [facility];  Service: Cardiovascular;  Laterality: N/A;  . UPPER EXTREMITY ANGIOGRAPHY Left 06/24/2018   Procedure: UPPER EXTREMITY ANGIOGRAPHY;  Surgeon: Algernon Huxley, MD;  Location: [place] [facility];  Service: Cardiovascular;  Laterality: Left;    History reviewed. No pertinent family history.  Allergies  Allergen Reactions  . Cephalexin Nausea Only  . Ivp Dye [Iodinated Diagnostic Agents] Diarrhea  . Lisinopril Other (See Comments)    Pt cannot recall reaction, cough  .  Other     CBC Latest Ref Rng & Units 08/09/2020 12/24/2018 06/30/2018  WBC 4.0 - 10.5 K/uL 6.8 - -  Hemoglobin 13.0 - 17.0 g/dL 14.1 14.6 13.6  Hematocrit 39.0 - 52.0 % 44.5 43.0 40.0  Platelets 150 - 400 K/uL 198 - -      CMP     Component Value Date/Time   NA 134 (L) 08/09/2020 1036   NA 138 02/24/2014 0359   K 5.2 (H) 08/09/2020 1036   K 3.5 02/24/2014 0359   CL 94 (L) 08/09/2020 1036   CL 97 (L) 02/24/2014 0359   CO2 24 08/09/2020 1036   CO2 31 02/24/2014 0359   GLUCOSE 303 (H) 08/09/2020 1036   GLUCOSE 231 (H) 02/24/2014 0359   BUN 42 (H) 08/09/2020 1036   BUN 38 (H) 02/24/2014 0359   CREATININE 13.47 (H) 08/09/2020 1036   CREATININE 12.04 (H) 02/24/2014 0359   CALCIUM 7.7 (L) 08/09/2020 1036   CALCIUM 7.9 (L) 02/24/2014 0359   PROT 7.9 05/07/2015 0937   PROT 7.3 02/22/2014 1526   ALBUMIN 4.0 05/07/2015 0937   ALBUMIN 3.2 (L) 02/22/2014 1526   AST 14 (L) 05/07/2015 0937   AST 27 02/22/2014 1526   ALT 13 (L) 05/07/2015 0937   ALT 17 02/22/2014 1526   ALKPHOS 77 05/07/2015 0937   ALKPHOS 73 02/22/2014 1526   BILITOT 0.5 05/07/2015 0937    BILITOT 0.8 02/22/2014 1526   GFRNONAA 4 (L) 08/09/2020 1036   GFRNONAA 5 (L) 02/24/2014 0359   GFRAA 3 (L) 05/07/2015 0937   GFRAA 6 (L) 02/24/2014 0359     No results found.     Assessment & Plan:   1. ESRD (end stage renal disease) ([place])  Patient is instructed to use enforcing ointment on open area of incision.  Recommend:  The patient is experiencing increasing problems with their dialysis access.  Patient should have a fistulagram with the intention for intervention.  The intention for intervention is to restore appropriate flow and prevent thrombosis and possible loss of the access.  As well as improve the quality of dialysis therapy.  The risks, benefits and alternative therapies were reviewed in detail with the patient.  All questions were answered.  The patient agrees to proceed with angio/intervention.      2. Uncontrolled type 2 diabetes mellitus with hyperglycemia, with long-term current use of insulin (HCC) Continue hypoglycemic medications as already ordered, these medications have been reviewed and there are no changes at this time.  Hgb A1C to be monitored as already arranged by primary service   3. Primary hypertension Continue antihypertensive medications as already ordered, these medications have been reviewed and there are no changes at this time.    Current Outpatient Medications on File Prior to Visit  Medication Sig Dispense Refill  . aspirin EC 81 MG tablet Take 81 mg by mouth daily at 6 PM. (1700)    . carvedilol (COREG) 12.5 MG tablet Take 12.5 mg by mouth 2 (two) times daily with a meal.     . HUMULIN 70/30 (70-30) 100 UNIT/ML injection Inject 10-30 Units into the skin See admin instructions. 10 units before breakfast and 30 units before supper ON DIALYSIS DAYS (M-W-F), NO AM INSULIN    . lanthanum (FOSRENOL) 1000 MG chewable tablet Chew 2,000 mg by mouth 3 (three) times daily with meals. Takes as a powder    . loperamide (IMODIUM) 2 MG capsule  Take 2 mg by mouth as needed for diarrhea  or loose stools (Take 1/2-1 tablet as meeded to prevent diarrhea. Can take up to 1 tablet every 4 hours. Do not exceed 4 tablets in a day.).    Marland Kitchen losartan (COZAAR) 50 MG tablet Take 50 mg by mouth daily. (1700)    . omeprazole (PRILOSEC) 20 MG capsule Take by mouth.    . SENSIPAR 60 MG tablet Take 60 mg by mouth daily at 6 PM. 1700  3  . sevelamer carbonate (RENVELA) 2.4 g PACK     . calcium acetate (PHOSLO) 667 MG capsule Take by mouth in the morning and at bedtime. Takes in AM (Patient not taking: No sig reported)     No current facility-administered medications on file prior to visit.    There are no Patient Instructions on file for this visit. No follow-ups on file.   Kris Hartmann, NP

## 2020-08-24 ENCOUNTER — Other Ambulatory Visit
Admission: RE | Admit: 2020-08-24 | Discharge: 2020-08-24 | Disposition: A | Discharge status: Home / Self Care | Payer: Medicare Other | Source: Ambulatory Visit | Attending: Vascular Surgery | Admitting: Vascular Surgery

## 2020-08-24 DIAGNOSIS — Z20822 Contact with and (suspected) exposure to covid-19: Secondary | ICD-10-CM | POA: Insufficient documentation

## 2020-08-24 DIAGNOSIS — Z01812 Encounter for preprocedural laboratory examination: Secondary | ICD-10-CM | POA: Diagnosis present

## 2020-08-24 NOTE — Telephone Encounter (Signed)
I attempted to contact the patient to schedule him for a left arm declot and a message was left for a return call.

## 2020-08-24 NOTE — Telephone Encounter (Signed)
Patient called back and left a message on the nurses line. I returned the call and to get the patient scheduled for a declot. Patient stated he was in bed and was not going, I advised the patient that in order for his access to be used it will need to be declotted and he will need a covid test today. I advised that he needs to get the test before 2:00 pm today as there will not be enough time on Monday for the test. Patient was told to be at the [facility] on 08/27/20 at 10:15 am for his procedure. Patient state he would try but he may not get there on time.

## 2020-08-25 LAB — SARS CORONAVIRUS 2 (TAT 6-24 HRS): SARS Coronavirus 2: NEGATIVE

## 2020-08-27 ENCOUNTER — Other Ambulatory Visit (INDEPENDENT_AMBULATORY_CARE_PROVIDER_SITE_OTHER): Payer: Self-pay | Admitting: Nurse Practitioner

## 2020-08-27 ENCOUNTER — Ambulatory Visit
Admission: RE | Admit: 2020-08-27 | Discharge: 2020-08-27 | Disposition: A | Discharge status: Home / Self Care | Payer: Medicare Other | Attending: Vascular Surgery | Admitting: Vascular Surgery

## 2020-08-27 ENCOUNTER — Encounter: Payer: Self-pay | Admitting: Vascular Surgery

## 2020-08-27 ENCOUNTER — Other Ambulatory Visit: Payer: Self-pay

## 2020-08-27 ENCOUNTER — Encounter
Admission: RE | Disposition: A | Discharge status: Home / Self Care | Payer: Self-pay | Source: Home / Self Care | Attending: Vascular Surgery

## 2020-08-27 DIAGNOSIS — E1122 Type 2 diabetes mellitus with diabetic chronic kidney disease: Secondary | ICD-10-CM | POA: Insufficient documentation

## 2020-08-27 DIAGNOSIS — I509 Heart failure, unspecified: Secondary | ICD-10-CM | POA: Diagnosis not present

## 2020-08-27 DIAGNOSIS — Z794 Long term (current) use of insulin: Secondary | ICD-10-CM | POA: Diagnosis not present

## 2020-08-27 DIAGNOSIS — I132 Hypertensive heart and chronic kidney disease with heart failure and with stage 5 chronic kidney disease, or end stage renal disease: Secondary | ICD-10-CM | POA: Diagnosis not present

## 2020-08-27 DIAGNOSIS — T82868A Thrombosis of vascular prosthetic devices, implants and grafts, initial encounter: Secondary | ICD-10-CM | POA: Insufficient documentation

## 2020-08-27 DIAGNOSIS — Z881 Allergy status to other antibiotic agents status: Secondary | ICD-10-CM | POA: Diagnosis not present

## 2020-08-27 DIAGNOSIS — Z79899 Other long term (current) drug therapy: Secondary | ICD-10-CM | POA: Diagnosis not present

## 2020-08-27 DIAGNOSIS — Z888 Allergy status to other drugs, medicaments and biological substances status: Secondary | ICD-10-CM | POA: Diagnosis not present

## 2020-08-27 DIAGNOSIS — Z992 Dependence on renal dialysis: Secondary | ICD-10-CM | POA: Diagnosis not present

## 2020-08-27 DIAGNOSIS — Y841 Kidney dialysis as the cause of abnormal reaction of the patient, or of later complication, without mention of misadventure at the time of the procedure: Secondary | ICD-10-CM | POA: Diagnosis not present

## 2020-08-27 DIAGNOSIS — Z91041 Radiographic dye allergy status: Secondary | ICD-10-CM | POA: Insufficient documentation

## 2020-08-27 DIAGNOSIS — N186 End stage renal disease: Secondary | ICD-10-CM

## 2020-08-27 DIAGNOSIS — Z7982 Long term (current) use of aspirin: Secondary | ICD-10-CM | POA: Insufficient documentation

## 2020-08-27 HISTORY — PX: PERIPHERAL VASCULAR THROMBECTOMY: CATH118306

## 2020-08-27 LAB — POTASSIUM (ARMC VASCULAR LAB ONLY): Potassium (ARMC vascular lab): 4.6 (ref 3.5–5.1)

## 2020-08-27 LAB — GLUCOSE, CAPILLARY
Glucose-Capillary: 134 mg/dL — ABNORMAL HIGH (ref 70–99)
Glucose-Capillary: 154 mg/dL — ABNORMAL HIGH (ref 70–99)

## 2020-08-27 SURGERY — PERIPHERAL VASCULAR THROMBECTOMY
Anesthesia: Moderate Sedation | Laterality: Left

## 2020-08-27 MED ORDER — HEPARIN SODIUM (PORCINE) 1000 UNIT/ML IJ SOLN
INTRAMUSCULAR | Status: AC
  Filled 2020-08-27: qty 1

## 2020-08-27 MED ORDER — DIPHENHYDRAMINE HCL 50 MG/ML IJ SOLN
50.0000 mg | Freq: Once | INTRAMUSCULAR | Status: AC | PRN
  Administered 2020-08-27: 50 mg via INTRAVENOUS

## 2020-08-27 MED ORDER — ALTEPLASE 2 MG IJ SOLR
INTRAMUSCULAR | Status: AC
  Filled 2020-08-27: qty 4

## 2020-08-27 MED ORDER — FENTANYL CITRATE (PF) 100 MCG/2ML IJ SOLN
INTRAMUSCULAR | Status: DC | PRN
  Administered 2020-08-27 (×2): 50 ug via INTRAVENOUS

## 2020-08-27 MED ORDER — FAMOTIDINE 20 MG PO TABS
40.0000 mg | ORAL_TABLET | Freq: Once | ORAL | Status: AC | PRN
  Administered 2020-08-27: 40 mg via ORAL

## 2020-08-27 MED ORDER — FAMOTIDINE 20 MG PO TABS
ORAL_TABLET | ORAL | Status: AC
  Filled 2020-08-27: qty 2

## 2020-08-27 MED ORDER — FENTANYL CITRATE (PF) 100 MCG/2ML IJ SOLN
INTRAMUSCULAR | Status: AC
  Filled 2020-08-27: qty 2

## 2020-08-27 MED ORDER — METHYLPREDNISOLONE SODIUM SUCC 125 MG IJ SOLR
INTRAMUSCULAR | Status: AC
  Filled 2020-08-27: qty 2

## 2020-08-27 MED ORDER — DIPHENHYDRAMINE HCL 50 MG/ML IJ SOLN
INTRAMUSCULAR | Status: AC
  Filled 2020-08-27: qty 1

## 2020-08-27 MED ORDER — MIDAZOLAM HCL 5 MG/5ML IJ SOLN
INTRAMUSCULAR | Status: AC
  Filled 2020-08-27: qty 5

## 2020-08-27 MED ORDER — CLINDAMYCIN PHOSPHATE 300 MG/50ML IV SOLN: 300.0000 mg | Freq: Once | INTRAVENOUS | Status: DC

## 2020-08-27 MED ORDER — MIDAZOLAM HCL 2 MG/2ML IJ SOLN
INTRAMUSCULAR | Status: DC | PRN
  Administered 2020-08-27: 2 mg via INTRAVENOUS
  Administered 2020-08-27: 1 mg via INTRAVENOUS

## 2020-08-27 MED ORDER — METHYLPREDNISOLONE SODIUM SUCC 125 MG IJ SOLR
125.0000 mg | Freq: Once | INTRAMUSCULAR | Status: AC | PRN
  Administered 2020-08-27: 125 mg via INTRAVENOUS

## 2020-08-27 MED ORDER — MIDAZOLAM HCL 2 MG/ML PO SYRP: 8.0000 mg | ORAL_SOLUTION | Freq: Once | ORAL | Status: DC | PRN

## 2020-08-27 MED ORDER — HYDROMORPHONE HCL 1 MG/ML IJ SOLN: 1.0000 mg | Freq: Once | INTRAMUSCULAR | Status: DC | PRN

## 2020-08-27 MED ORDER — ALTEPLASE 2 MG IJ SOLR
INTRAMUSCULAR | Status: DC | PRN
  Administered 2020-08-27: 4 mg

## 2020-08-27 MED ORDER — ONDANSETRON HCL 4 MG/2ML IJ SOLN: 4.0000 mg | Freq: Four times a day (QID) | INTRAMUSCULAR | Status: DC | PRN

## 2020-08-27 MED ORDER — HEPARIN SODIUM (PORCINE) 1000 UNIT/ML IJ SOLN
INTRAMUSCULAR | Status: DC | PRN
  Administered 2020-08-27: 4000 [IU] via INTRAVENOUS

## 2020-08-27 MED ORDER — CLINDAMYCIN PHOSPHATE 300 MG/50ML IV SOLN
INTRAVENOUS | Status: AC
  Administered 2020-08-27: 300 mg
  Filled 2020-08-27: qty 50

## 2020-08-27 MED ORDER — SODIUM CHLORIDE 0.9 % IV SOLN: INTRAVENOUS | Status: DC

## 2020-08-27 SURGICAL SUPPLY — 30 items
BALLN ULTRVRSE 7X150X130 (BALLOONS) ×2
BALLN ULTRVRSE 7X200X75 (BALLOONS) ×2
BALLN ULTRVRSE 8X100X75 (BALLOONS) ×2
BALLN ULTRVRSE 8X200X130 (BALLOONS) ×2
BALLOON ULTRVRSE 7X150X130 (BALLOONS) ×1 IMPLANT
BALLOON ULTRVRSE 7X200X75 (BALLOONS) ×1 IMPLANT
BALLOON ULTRVRSE 8X100X75 (BALLOONS) ×1 IMPLANT
BALLOON ULTRVRSE 8X200X130 (BALLOONS) ×1 IMPLANT
BNDG GAUZE 4.5X4.1 6PLY STRL (MISCELLANEOUS) ×2 IMPLANT
CANNULA 5F STIFF (CANNULA) ×2 IMPLANT
CATH EMBOLECTOMY 5FR (BALLOONS) ×2 IMPLANT
CATH INDIGO 7D KIT (CATHETERS) ×2 IMPLANT
CATH KUMPE SOFT-VU 5FR 65 (CATHETERS) ×2 IMPLANT
COVER PROBE U/S 5X48 (MISCELLANEOUS) ×2 IMPLANT
DRAPE BRACHIAL (DRAPES) ×4 IMPLANT
GLIDEWIRE ADV .035X180CM (WIRE) ×4 IMPLANT
GOWN SRG XL LVL 3 NONREINFORCE (GOWNS) ×1 IMPLANT
GOWN STRL NON-REIN TWL XL LVL3 (GOWNS) ×1
KIT ENCORE 26 ADVANTAGE (KITS) ×2 IMPLANT
PACK ANGIOGRAPHY (CUSTOM PROCEDURE TRAY) ×2 IMPLANT
SHEATH BRITE TIP 6FRX5.5 (SHEATH) ×2 IMPLANT
SHEATH BRITE TIP 7FRX5.5 (SHEATH) ×4 IMPLANT
STENT VIABAHN 8X100X120 (Permanent Stent) ×1 IMPLANT
STENT VIABAHN 8X10X120 (Permanent Stent) ×1 IMPLANT
STENT VIABAHN 8X2.5X120 (Permanent Stent) ×1 IMPLANT
STENT VIABAHN 8X250X120 (Permanent Stent) ×2 IMPLANT
STENT VIABAHN 8X25X120 (Permanent Stent) ×1 IMPLANT
SUT MNCRL AB 4-0 PS2 18 (SUTURE) ×2 IMPLANT
WIRE G V18X300CM (WIRE) ×2 IMPLANT
WIRE MAGIC TOR.035 180C (WIRE) ×2 IMPLANT

## 2020-08-27 NOTE — Interval H&P Note (Signed)
History and Physical Interval Note:  08/27/2020 10:02 AM  [name] [name]  has presented today for surgery, with the diagnosis of LT arm declot   End Stage Renal Covid April 29.  The various methods of treatment have been discussed with the patient and family. After consideration of risks, benefits and other options for treatment, the patient has consented to  Procedure(s): PERIPHERAL VASCULAR THROMBECTOMY (Left) as a surgical intervention.  The patient's history has been reviewed, patient examined, no change in status, stable for surgery.  I have reviewed the patient's chart and labs.  Questions were answered to the patient's satisfaction.     [name]

## 2020-08-27 NOTE — Op Note (Signed)
[facility]    OPERATIVE NOTE   PROCEDURE: 1.  Left brachiocephalic AV fistula with artegraft jump graft cannulation under ultrasound guidance in both a retrograde and then antegrade fashion crossing 2.  Left arm shuntogram and central venogram 3.  Catheter directed thrombolysis with 4 mg of TPA  4.  Mechanical thrombectomy to the left brachiocephalic AV fistula and the Artegraft jump graft all the way to the subclavian vein with the penumbra CAT 7D 5.  Fogarty embolectomy for residual arterial plug 6.  Percutaneous transluminal angioplasty of arterial anastomosis and then the initial portion of the left brachiocephalic AV fistula to jump graft anastomosis with 7 mm balloon 7.  Percutaneous transluminal angioplasty of the distal jump graft to brachiocephalic AV fistula, proximal arm cephalic vein, all the way to the subclavian vein cephalic vein confluence with 7 mm diameter angioplasty balloon 8.  Viabahn stent x2 to the left brachiocephalic AV fistula distal anastomosis to the jump graft, proximal arm cephalic vein, and cephalic vein subclavian vein confluence with 8 mm diameter by 25 cm length and 8 mm diameter by 2.5 cm length stents 9.  Viabahn stent to the left brachiocephalic AV fistula near the original arterial anastomosis and across the initial anastomosis to the jump graft with 8 mm diameter by 10 cm length stent  PRE-OPERATIVE DIAGNOSIS: 1. ESRD 2.  Thrombosed left brachiocephalic fistula and Artegraft jump graft   POST-OPERATIVE DIAGNOSIS: same as above   SURGEON: [name], MD  ANESTHESIA: local with Moderate Conscious Sedation for approximately 47 minutes using 3 mg of Versed and 100 mcg of Fentanyl  ESTIMATED BLOOD LOSS: 200 cc  FINDING(S): 1. Thrombosed fistula and jump graft  SPECIMEN(S):  None  CONTRAST: 55 cc  FLUORO TIME:  15.8 minutes  INDICATIONS: Patient is a 45 y.o.male who presents with a thrombosed left brachiocephalic AV  fistula and Artegraft jump graft.  The patient is scheduled for an attempted declot and shuntogram.  The patient is aware the risks include but are not limited to: bleeding, infection, thrombosis of the cannulated access, and possible anaphylactic reaction to the contrast.  The patient is aware of the risks of the procedure and elects to proceed forward.  DESCRIPTION: After full informed written consent was obtained, the patient was brought back to the angiography suite and placed supine upon the angiography table.  The patient was connected to monitoring equipment. Moderate conscious sedation was administered with a face to face encounter with the patient throughout the procedure with my supervision of the RN administering medicines and monitoring the patient's vital signs, pulse oximetry, telemetry and mental status throughout from the start of the procedure until the patient was taken to the recovery room. The left arm was prepped and draped in the standard fashion for a percutaneous access intervention.  Under ultrasound guidance, the left brachiocephalic AV fistula and Artegraft jump graft was cannulated with a micropuncture needle under direct ultrasound guidance due to the pulseless nature of the graft in both an antegrade and a retrograde fashion crossing, and permanent images were performed.  The access was obtained in the initial portion of the brachiocephalic AV fistula itself with the antegrade sheath and within the Artegraft jump graft with retrograde sheath.  The microwire was advanced and the needle was exchanged for the a microsheath.  I then upsized to a 7 Fr Sheaths and imaging was performed.  Hand injections were completed to image the access including the central venous system. This demonstrated no flow  within the AV graft.  Based on the images, this patient will need extensive treatment to salvage the graft. I then gave the patient 4000 units of intravenous heparin.  I then placed a Magic  torque wire into the brachial artery from the retrograde sheath and into the subclavian vein from the antegrade sheath. 4 mg of TPA were deployed. This was allowed to dwell. Mechanical thrombectomy was then performed throughout the graft and into the subclavian vein.  This was done with the penumbra CAT 7D catheter and was taken to the arterial anastomosis through the retrograde sheath and into the subclavian vein with the antegrade sheath.  This uncovered aneurysmal segments of the native vessel with large amounts of thrombus as well as stenosis near the original arterial anastomosis and at the cephalic vein subclavian vein confluence.  A residual arterial plug was also seen at the arterial anastomosis. An attempt to clear the arterial plug was done with 2 passes of the Fogarty embolectomy balloon. Flow-limiting arterial plug remained, and I elected to treat this lesion with a 7 mm diameter by 15 cm length angioplasty balloon.  Through the antegrade sheath, a 7 mm diameter by 20 cm length angioplasty balloon was inflated twice all the way into the subclavian vein, back across the cephalic vein subclavian vein confluence, and back into the Artegraft jump graft in its midportion.  Following this, there remained a large amount of residual thrombus and stenosis.  I then exchanged for a 0.018 wire with the antegrade sheath.  To cover the large amount of residual thrombus and stenosis, an 8 mm diameter by 25 cm length Viabahn stent was deployed from the cephalic vein subclavian vein confluence back to the anastomosis more centrally with the Artegraft jump graft to the brachiocephalic AV fistula.  There was a small blob of thrombus at the edge within the Artegraft so an additional 2.5 cm length 8 mm Viabahn stent was deployed.  These were postdilated with 8 mm balloons with excellent angiographic completion result and no significant residual stenosis.  I was then able to antegrade sheath to address the thrombus in the  initial portion of the brachiocephalic AV fistula prior to the jump graft and the stenosis near the anastomosis.  After the antegrade sheath was removed with a 4-0 Monocryl placed, a Kumpe catheter was placed in the brachial artery and exchanged for a V 18 wire.  Magnified roadmap images allowed [place] to deploy the stent just into the cephalic vein beyond the brachiocephalic anastomosis and then across the brachiocephalic anastomosis to the jump graft with an 8 mm diameter by 10 cm length Viabahn stent postdilated with an 8 mm balloon with excellent angiographic completion result and less than 10% residual stenosis.  There is now palpable flow within the access.   Based on the completion imaging, no further intervention is necessary.  The wire and balloon were removed from the sheath.  A 4-0 Monocryl purse-string suture was sewn around the sheath.  The sheath was removed while tying down the suture.  A sterile bandage was applied to the puncture site.  COMPLICATIONS: None  CONDITION: Stable   [name] 08/27/2020 12:23 PM   This note was created with Dragon Medical transcription system. Any errors in dictation are purely unintentional.

## 2020-08-27 NOTE — Progress Notes (Signed)
Dr. Lucky Cowboy at bedside, speaking with pt. And his mother Constance Holster re: procedural results. MD states pt. May start using fistula in Left arm on Friday, May 6th. Pt. And mother verbalize understanding of conversation.

## 2020-09-04 ENCOUNTER — Other Ambulatory Visit: Payer: Self-pay

## 2020-09-04 ENCOUNTER — Encounter: Payer: Self-pay | Admitting: *Deleted

## 2020-09-04 ENCOUNTER — Encounter: Payer: Medicare Other | Attending: Nurse Practitioner | Admitting: *Deleted

## 2020-09-04 VITALS — Ht 73.0 in | Wt 282.5 lb

## 2020-09-04 DIAGNOSIS — N186 End stage renal disease: Secondary | ICD-10-CM | POA: Diagnosis not present

## 2020-09-04 DIAGNOSIS — I120 Hypertensive chronic kidney disease with stage 5 chronic kidney disease or end stage renal disease: Secondary | ICD-10-CM | POA: Insufficient documentation

## 2020-09-04 DIAGNOSIS — K219 Gastro-esophageal reflux disease without esophagitis: Secondary | ICD-10-CM | POA: Diagnosis not present

## 2020-09-04 DIAGNOSIS — E1122 Type 2 diabetes mellitus with diabetic chronic kidney disease: Secondary | ICD-10-CM | POA: Insufficient documentation

## 2020-09-04 DIAGNOSIS — Z992 Dependence on renal dialysis: Secondary | ICD-10-CM | POA: Diagnosis not present

## 2020-09-04 DIAGNOSIS — E663 Overweight: Secondary | ICD-10-CM | POA: Diagnosis not present

## 2020-09-04 DIAGNOSIS — Z794 Long term (current) use of insulin: Secondary | ICD-10-CM

## 2020-09-04 NOTE — Patient Instructions (Addendum)
Check blood sugars 4 x day before each meal and before bed every day and as needed with FreeStyle Libre  Bring CGM reader to the next appointment  Exercise: Walk as tolerated  Eat 3 meals day, 1-2  snacks a day Space meals 4-6 hours apart Limit desserts/sweets Avoid sugar sweetened drinks (soda, juices) unless treating a low blood sugar  Make an eye doctor appointment  Carry fast acting glucose and a snack at all times Rotate injection sites Don't shake insulin  Return for appointment on:  Thursday October 11, 2020 at 3:00 pm with [facility] (dietitian)

## 2020-09-04 NOTE — Progress Notes (Signed)
Diabetes Self-Management Education  Visit Type: First/Initial  Appt. Start Time: 1015 Appt. End Time: [id number]  09/04/2020  [name], identified by name and date of birth, is a 45 y.o. male with a diagnosis of Diabetes: Type 2.   ASSESSMENT  Height '6\' 1"'$  (1.854 m), weight 282 lb 8 oz (128.1 kg). Body mass index is 37.27 kg/m.   Diabetes Self-Management Education - 09/04/20 1440      Visit Information   Visit Type First/Initial      Initial Visit   Diabetes Type Type 2    Are you currently following a meal plan? Yes    What type of meal plan do you follow? renal - but reports "cheating"    Are you taking your medications as prescribed? Yes    Date Diagnosed 2000      Health Coping   How would you rate your overall health? Good      Psychosocial Assessment   Patient Belief/Attitude about Diabetes Other (comment)   "it is the pits"   Self-care barriers Other (comment);Impaired vision   Kidney dialysis 3 x week; he didn't bring his magnifying glass   Self-management support Doctor's office    Patient Concerns Nutrition/Meal planning;Medication;Monitoring;Healthy Lifestyle;Problem Solving;Glycemic Control;Weight Control    Special Needs Large print   unless he brings his magnifyin glass   Preferred Learning Style Auditory;Visual    Learning Readiness Not Ready    How often do you need to have someone help you when you read instructions, pamphlets, or other written materials from your doctor or pharmacy? 4 - Often   nurse completed paperwork today   What is the last grade level you completed in school? 12th      Pre-Education Assessment   Patient understands the diabetes disease and treatment process. Needs Instruction    Patient understands incorporating nutritional management into lifestyle. Needs Instruction    Patient undertands incorporating physical activity into lifestyle. Needs Instruction    Patient understands using medications safely. Needs Instruction     Patient understands monitoring blood glucose, interpreting and using results Needs Review    Patient understands prevention, detection, and treatment of acute complications. Needs Review    Patient understands prevention, detection, and treatment of chronic complications. Needs Review    Patient understands how to develop strategies to address psychosocial issues. Needs Review    Patient understands how to develop strategies to promote health/change behavior. Needs Review      Complications   Last HgB A1C per patient/outside source 9.6 %   03/2020   How often do you check your blood sugar? > 4 times/day   Pt has FreeStyle Libre. Average blood sugars 184 mg/dL 14 days; 188 mg/dL 30 days; 177 mg/dL 90 days   Number of hypoglycemic episodes per month 1   Below target range 4 % for 14 days, 3 % for 30 days; 7 % for 90 days   Can you tell when your blood sugar is low? Yes    What do you do if your blood sugar is low? eat tootsie rolls    Have you had a dilated eye exam in the past 12 months? No    Have you had a dental exam in the past 12 months? No    Are you checking your feet? Yes    How many days per week are you checking your feet? 7      Dietary Intake   Breakfast eats early if going to dialysis and may  doesn't eat if at home - egg, sausage, hashbrown, toast, fruit salad (mandarin oranges, pineapple, peaches)    Lunch sub    Snack (afternoon) reports eating tootsie rolls for snacks    Dinner easfood, chicken, beef, potaotes, peas, beans, corn, rice, pasta, green beans, caggabe, squash    Beverage(s) water, regular soda      Exercise   Exercise Type ADL's      Patient Education   Previous Diabetes Education Yes (please comment)   can't remember when   Disease state  Definition of diabetes, type 1 and 2, and the diagnosis of diabetes    Nutrition management  Role of diet in the treatment of diabetes and the relationship between the three main macronutrients and blood glucose level;Food  label reading, portion sizes and measuring food.;Reviewed blood glucose goals for pre and post meals and how to evaluate the patients' food intake on their blood glucose level.;Meal timing in regards to the patients' current diabetes medication.    Physical activity and exercise  Role of exercise on diabetes management, blood pressure control and cardiac health.    Medications Taught/reviewed insulin injection, site rotation, insulin storage and needle disposal.;Reviewed patients medication for diabetes, action, purpose, timing of dose and side effects.    Monitoring Purpose and frequency of SMBG.;Taught/discussed recording of test results and interpretation of SMBG.;Identified appropriate SMBG and/or A1C goals.;Other (comment)   Appropriate goals for time in target, above and below target   Acute complications Taught treatment of hypoglycemia - the 15 rule.; limited teeth - can't chew glucose tablets   Chronic complications Relationship between chronic complications and blood glucose control;Identified and discussed with patient  current chronic complications    Psychosocial adjustment Identified and addressed patients feelings and concerns about diabetes      Individualized Goals (developed by patient)   Reducing Risk Other (comment)   improve blood sugars, decrease medications, prevent diabetes complications, lose weight, lead a healthier lifestyle, become more fit     Outcomes   Expected Outcomes Demonstrated limited interest in learning.  Expect minimal changes    Future DMSE 4-6 wks           Individualized Plan for Diabetes Self-Management Training:   Learning Objective:  Patient will have a greater understanding of diabetes self-management. Patient education plan is to attend individual and/or group sessions per assessed needs and concerns.   Plan:   Patient Instructions  Check blood sugars 4 x day before each meal and before bed every day and as needed with FreeStyle  Libre  Bring CGM reader to the next appointment  Exercise: Walk as tolerated  Eat 3 meals day, 1-2  snacks a day Space meals 4-6 hours apart Limit desserts/sweets Avoid sugar sweetened drinks (soda, juices) unless treating a low blood sugar  Make an eye doctor appointment  Carry fast acting glucose and a snack at all times Rotate injection sites Don't shake insulin  Return for appointment on:  Thursday October 11, 2020 at 3:00 pm with Pam (dietitian)  Expected Outcomes:  Demonstrated limited interest in learning.  Expect minimal changes  Education material provided:  General Meal Planning Guidelines Simple Meal Plan Symptoms, causes and treatments of Hypoglycemia  If problems or questions, patient to contact team via:  Johny Drilling, RN, [place], [place] [phone]  Future DSME appointment: 4-6 wks  October 11, 2020 with the dietitian

## 2020-09-10 ENCOUNTER — Telehealth (INDEPENDENT_AMBULATORY_CARE_PROVIDER_SITE_OTHER): Payer: Self-pay

## 2020-09-10 NOTE — Telephone Encounter (Signed)
Pt called and left a Vm on the nurse's line wanting to know when was his next appointment  That he he wants to get the cath out of his chest. I called the pt and explained to him that the cath removal was between him and the provider and that his next appointment to discuss this was on 09/20/20'@2'$ :00PM.

## 2020-09-13 ENCOUNTER — Encounter (INDEPENDENT_AMBULATORY_CARE_PROVIDER_SITE_OTHER): Payer: Medicare Other

## 2020-09-13 ENCOUNTER — Ambulatory Visit (INDEPENDENT_AMBULATORY_CARE_PROVIDER_SITE_OTHER): Payer: Medicare Other | Admitting: Nurse Practitioner

## 2020-09-19 ENCOUNTER — Other Ambulatory Visit (INDEPENDENT_AMBULATORY_CARE_PROVIDER_SITE_OTHER): Payer: Self-pay | Admitting: Vascular Surgery

## 2020-09-19 DIAGNOSIS — N186 End stage renal disease: Secondary | ICD-10-CM

## 2020-09-19 DIAGNOSIS — Z95820 Peripheral vascular angioplasty status with implants and grafts: Secondary | ICD-10-CM

## 2020-09-20 ENCOUNTER — Ambulatory Visit (INDEPENDENT_AMBULATORY_CARE_PROVIDER_SITE_OTHER): Payer: Medicare Other

## 2020-09-20 ENCOUNTER — Other Ambulatory Visit: Payer: Self-pay

## 2020-09-20 ENCOUNTER — Ambulatory Visit (INDEPENDENT_AMBULATORY_CARE_PROVIDER_SITE_OTHER): Payer: Medicare Other | Admitting: Nurse Practitioner

## 2020-09-20 ENCOUNTER — Telehealth (INDEPENDENT_AMBULATORY_CARE_PROVIDER_SITE_OTHER): Payer: Self-pay

## 2020-09-20 ENCOUNTER — Encounter (INDEPENDENT_AMBULATORY_CARE_PROVIDER_SITE_OTHER): Payer: Self-pay | Admitting: Nurse Practitioner

## 2020-09-20 VITALS — BP 149/73 | HR 89 | Resp 16 | Wt 278.8 lb

## 2020-09-20 DIAGNOSIS — I10 Essential (primary) hypertension: Secondary | ICD-10-CM

## 2020-09-20 DIAGNOSIS — E1122 Type 2 diabetes mellitus with diabetic chronic kidney disease: Secondary | ICD-10-CM

## 2020-09-20 DIAGNOSIS — N186 End stage renal disease: Secondary | ICD-10-CM

## 2020-09-20 DIAGNOSIS — Z95820 Peripheral vascular angioplasty status with implants and grafts: Secondary | ICD-10-CM | POA: Diagnosis not present

## 2020-09-20 NOTE — Telephone Encounter (Signed)
Patient was handed the pre-procedure instructions for his upcoming permcath removal on 09/27/20 with a 10:45 am arrival time to the MM. This is with Dr. Lucky Cowboy.

## 2020-09-23 ENCOUNTER — Inpatient Hospital Stay
Admission: EM | Admit: 2020-09-23 | Discharge: 2020-09-28 | DRG: 252 | Disposition: A | Discharge status: Home / Self Care | Payer: Medicare Other | Attending: Internal Medicine | Admitting: Internal Medicine

## 2020-09-23 ENCOUNTER — Other Ambulatory Visit: Payer: Self-pay

## 2020-09-23 ENCOUNTER — Encounter: Payer: Self-pay | Admitting: Intensive Care

## 2020-09-23 ENCOUNTER — Emergency Department: Payer: Medicare Other

## 2020-09-23 DIAGNOSIS — Z794 Long term (current) use of insulin: Secondary | ICD-10-CM

## 2020-09-23 DIAGNOSIS — Z992 Dependence on renal dialysis: Secondary | ICD-10-CM

## 2020-09-23 DIAGNOSIS — I10 Essential (primary) hypertension: Secondary | ICD-10-CM | POA: Diagnosis present

## 2020-09-23 DIAGNOSIS — Y832 Surgical operation with anastomosis, bypass or graft as the cause of abnormal reaction of the patient, or of later complication, without mention of misadventure at the time of the procedure: Secondary | ICD-10-CM | POA: Diagnosis present

## 2020-09-23 DIAGNOSIS — E1151 Type 2 diabetes mellitus with diabetic peripheral angiopathy without gangrene: Secondary | ICD-10-CM | POA: Diagnosis present

## 2020-09-23 DIAGNOSIS — L870 Keratosis follicularis et parafollicularis in cutem penetrans: Secondary | ICD-10-CM | POA: Diagnosis present

## 2020-09-23 DIAGNOSIS — K219 Gastro-esophageal reflux disease without esophagitis: Secondary | ICD-10-CM | POA: Diagnosis present

## 2020-09-23 DIAGNOSIS — Z6841 Body Mass Index (BMI) 40.0 and over, adult: Secondary | ICD-10-CM

## 2020-09-23 DIAGNOSIS — Z7982 Long term (current) use of aspirin: Secondary | ICD-10-CM | POA: Diagnosis not present

## 2020-09-23 DIAGNOSIS — T827XXD Infection and inflammatory reaction due to other cardiac and vascular devices, implants and grafts, subsequent encounter: Secondary | ICD-10-CM | POA: Diagnosis not present

## 2020-09-23 DIAGNOSIS — Z79899 Other long term (current) drug therapy: Secondary | ICD-10-CM | POA: Diagnosis not present

## 2020-09-23 DIAGNOSIS — Z888 Allergy status to other drugs, medicaments and biological substances status: Secondary | ICD-10-CM

## 2020-09-23 DIAGNOSIS — I132 Hypertensive heart and chronic kidney disease with heart failure and with stage 5 chronic kidney disease, or end stage renal disease: Secondary | ICD-10-CM | POA: Diagnosis present

## 2020-09-23 DIAGNOSIS — T827XXA Infection and inflammatory reaction due to other cardiac and vascular devices, implants and grafts, initial encounter: Principal | ICD-10-CM | POA: Diagnosis present

## 2020-09-23 DIAGNOSIS — E11319 Type 2 diabetes mellitus with unspecified diabetic retinopathy without macular edema: Secondary | ICD-10-CM | POA: Diagnosis present

## 2020-09-23 DIAGNOSIS — E1122 Type 2 diabetes mellitus with diabetic chronic kidney disease: Secondary | ICD-10-CM | POA: Diagnosis present

## 2020-09-23 DIAGNOSIS — N2581 Secondary hyperparathyroidism of renal origin: Secondary | ICD-10-CM | POA: Diagnosis present

## 2020-09-23 DIAGNOSIS — D631 Anemia in chronic kidney disease: Secondary | ICD-10-CM | POA: Diagnosis present

## 2020-09-23 DIAGNOSIS — Z881 Allergy status to other antibiotic agents status: Secondary | ICD-10-CM | POA: Diagnosis not present

## 2020-09-23 DIAGNOSIS — Z20822 Contact with and (suspected) exposure to covid-19: Secondary | ICD-10-CM | POA: Diagnosis present

## 2020-09-23 DIAGNOSIS — N186 End stage renal disease: Secondary | ICD-10-CM | POA: Diagnosis present

## 2020-09-23 DIAGNOSIS — M25511 Pain in right shoulder: Secondary | ICD-10-CM | POA: Diagnosis present

## 2020-09-23 DIAGNOSIS — T82594A Other mechanical complication of infusion catheter, initial encounter: Secondary | ICD-10-CM | POA: Diagnosis not present

## 2020-09-23 DIAGNOSIS — I5032 Chronic diastolic (congestive) heart failure: Secondary | ICD-10-CM | POA: Diagnosis present

## 2020-09-23 DIAGNOSIS — Y712 Prosthetic and other implants, materials and accessory cardiovascular devices associated with adverse incidents: Secondary | ICD-10-CM | POA: Diagnosis not present

## 2020-09-23 DIAGNOSIS — Z833 Family history of diabetes mellitus: Secondary | ICD-10-CM

## 2020-09-23 DIAGNOSIS — I2510 Atherosclerotic heart disease of native coronary artery without angina pectoris: Secondary | ICD-10-CM | POA: Diagnosis present

## 2020-09-23 DIAGNOSIS — L089 Local infection of the skin and subcutaneous tissue, unspecified: Secondary | ICD-10-CM | POA: Diagnosis not present

## 2020-09-23 DIAGNOSIS — E875 Hyperkalemia: Secondary | ICD-10-CM | POA: Diagnosis not present

## 2020-09-23 DIAGNOSIS — E66813 Obesity, class 3: Secondary | ICD-10-CM | POA: Diagnosis present

## 2020-09-23 HISTORY — DX: Infection and inflammatory reaction due to other cardiac and vascular devices, implants and grafts, initial encounter: T82.7XXA

## 2020-09-23 LAB — CBC WITH DIFFERENTIAL/PLATELET
Abs Immature Granulocytes: 0.03 K/uL (ref 0.00–0.07)
Basophils Absolute: 0.1 K/uL (ref 0.0–0.1)
Basophils Relative: 1 %
Eosinophils Absolute: 0.3 K/uL (ref 0.0–0.5)
Eosinophils Relative: 3 %
HCT: 32.4 % — ABNORMAL LOW (ref 39.0–52.0)
Hemoglobin: 10 g/dL — ABNORMAL LOW (ref 13.0–17.0)
Immature Granulocytes: 0 %
Lymphocytes Relative: 10 %
Lymphs Abs: 0.9 K/uL (ref 0.7–4.0)
MCH: 27.6 pg (ref 26.0–34.0)
MCHC: 30.9 g/dL (ref 30.0–36.0)
MCV: 89.5 fL (ref 80.0–100.0)
Monocytes Absolute: 0.7 K/uL (ref 0.1–1.0)
Monocytes Relative: 9 %
Neutro Abs: 6.4 K/uL (ref 1.7–7.7)
Neutrophils Relative %: 77 %
Platelets: 225 K/uL (ref 150–400)
RBC: 3.62 MIL/uL — ABNORMAL LOW (ref 4.22–5.81)
RDW: 14.9 % (ref 11.5–15.5)
WBC: 8.3 K/uL (ref 4.0–10.5)
nRBC: 0 % (ref 0.0–0.2)

## 2020-09-23 LAB — COMPREHENSIVE METABOLIC PANEL
ALT: 7 U/L (ref 0–44)
AST: 11 U/L — ABNORMAL LOW (ref 15–41)
Albumin: 3.6 g/dL (ref 3.5–5.0)
Alkaline Phosphatase: 77 U/L (ref 38–126)
Anion gap: 15 (ref 5–15)
BUN: 44 mg/dL — ABNORMAL HIGH (ref 6–20)
CO2: 23 mmol/L (ref 22–32)
Calcium: 7.3 mg/dL — ABNORMAL LOW (ref 8.9–10.3)
Chloride: 95 mmol/L — ABNORMAL LOW (ref 98–111)
Creatinine, Ser: 14.69 mg/dL — ABNORMAL HIGH (ref 0.61–1.24)
GFR, Estimated: 4 mL/min — ABNORMAL LOW (ref >60)
Glucose, Bld: 261 mg/dL — ABNORMAL HIGH (ref 70–99)
Potassium: 4.9 mmol/L (ref 3.5–5.1)
Sodium: 133 mmol/L — ABNORMAL LOW (ref 135–145)
Total Bilirubin: 0.8 mg/dL (ref 0.3–1.2)
Total Protein: 8.1 g/dL (ref 6.5–8.1)

## 2020-09-23 LAB — GLUCOSE, CAPILLARY
Glucose-Capillary: 219 mg/dL — ABNORMAL HIGH (ref 70–99)
Glucose-Capillary: 105 mg/dL — ABNORMAL HIGH (ref 70–99)

## 2020-09-23 LAB — MRSA PCR SCREENING: MRSA by PCR: NEGATIVE

## 2020-09-23 LAB — LACTIC ACID, PLASMA
Lactic Acid, Venous: 1 mmol/L (ref 0.5–1.9)
Lactic Acid, Venous: 0.8 mmol/L (ref 0.5–1.9)

## 2020-09-23 MED ORDER — HEPARIN SODIUM (PORCINE) 5000 UNIT/ML IJ SOLN
5000.0000 [IU] | Freq: Three times a day (TID) | INTRAMUSCULAR | Status: DC
  Administered 2020-09-23 – 2020-09-28 (×10): 5000 [IU] via SUBCUTANEOUS
  Filled 2020-09-23 (×10): qty 1

## 2020-09-23 MED ORDER — CINACALCET HCL 30 MG PO TABS
60.0000 mg | ORAL_TABLET | Freq: Every day | ORAL | Status: DC
  Administered 2020-09-23 – 2020-09-27 (×5): 60 mg via ORAL
  Filled 2020-09-23 (×7): qty 2

## 2020-09-23 MED ORDER — OXYCODONE-ACETAMINOPHEN 5-325 MG PO TABS: 1.0000 | ORAL_TABLET | Freq: Four times a day (QID) | ORAL | Status: DC | PRN

## 2020-09-23 MED ORDER — SODIUM CHLORIDE 0.9% FLUSH: 3.0000 mL | INTRAVENOUS | Status: DC | PRN

## 2020-09-23 MED ORDER — CARVEDILOL 6.25 MG PO TABS
12.5000 mg | ORAL_TABLET | Freq: Two times a day (BID) | ORAL | Status: DC
  Administered 2020-09-23 – 2020-09-27 (×8): 12.5 mg via ORAL
  Filled 2020-09-23 (×7): qty 2
  Filled 2020-09-23: qty 4
  Filled 2020-09-23: qty 2

## 2020-09-23 MED ORDER — INSULIN ASPART 100 UNIT/ML IJ SOLN
0.0000 [IU] | Freq: Three times a day (TID) | INTRAMUSCULAR | Status: DC
  Administered 2020-09-23: 7 [IU] via SUBCUTANEOUS
  Administered 2020-09-24: 3 [IU] via SUBCUTANEOUS
  Administered 2020-09-24: 4 [IU] via SUBCUTANEOUS
  Administered 2020-09-25: 3 [IU] via SUBCUTANEOUS
  Administered 2020-09-25 – 2020-09-26 (×3): 4 [IU] via SUBCUTANEOUS
  Administered 2020-09-27: 18:00:00 7 [IU] via SUBCUTANEOUS
  Filled 2020-09-23 (×11): qty 1

## 2020-09-23 MED ORDER — LOSARTAN POTASSIUM 50 MG PO TABS
50.0000 mg | ORAL_TABLET | Freq: Every day | ORAL | Status: DC
  Administered 2020-09-23 – 2020-09-26 (×3): 50 mg via ORAL
  Filled 2020-09-23 (×3): qty 1

## 2020-09-23 MED ORDER — SODIUM CHLORIDE 0.9 % IV SOLN
250.0000 mL | INTRAVENOUS | Status: DC | PRN
  Administered 2020-09-24: 15:00:00 250 mL via INTRAVENOUS

## 2020-09-23 MED ORDER — VANCOMYCIN HCL IN DEXTROSE 1-5 GM/200ML-% IV SOLN
1000.0000 mg | INTRAVENOUS | Status: DC
  Administered 2020-09-24: 15:00:00 1000 mg via INTRAVENOUS
  Filled 2020-09-23 (×4): qty 200

## 2020-09-23 MED ORDER — ASPIRIN EC 81 MG PO TBEC
81.0000 mg | DELAYED_RELEASE_TABLET | Freq: Every day | ORAL | Status: DC
  Administered 2020-09-23 – 2020-09-26 (×4): 81 mg via ORAL
  Filled 2020-09-23 (×4): qty 1

## 2020-09-23 MED ORDER — ONDANSETRON HCL 4 MG/2ML IJ SOLN: 4.0000 mg | Freq: Four times a day (QID) | INTRAMUSCULAR | Status: DC | PRN

## 2020-09-23 MED ORDER — PANTOPRAZOLE SODIUM 40 MG PO TBEC
40.0000 mg | DELAYED_RELEASE_TABLET | Freq: Every day | ORAL | Status: DC
  Administered 2020-09-23 – 2020-09-26 (×4): 40 mg via ORAL
  Filled 2020-09-23 (×4): qty 1

## 2020-09-23 MED ORDER — KETOROLAC TROMETHAMINE 30 MG/ML IJ SOLN: 30.0000 mg | Freq: Once | INTRAMUSCULAR | Status: DC

## 2020-09-23 MED ORDER — PIPERACILLIN-TAZOBACTAM 3.375 G IVPB 30 MIN
3.3750 g | Freq: Once | INTRAVENOUS | Status: AC
  Administered 2020-09-23: 3.375 g via INTRAVENOUS
  Filled 2020-09-23: qty 50

## 2020-09-23 MED ORDER — ONDANSETRON HCL 4 MG PO TABS: 4.0000 mg | ORAL_TABLET | Freq: Four times a day (QID) | ORAL | Status: DC | PRN

## 2020-09-23 MED ORDER — CALCIUM ACETATE (PHOS BINDER) 667 MG PO CAPS
667.0000 mg | ORAL_CAPSULE | Freq: Three times a day (TID) | ORAL | Status: DC
  Administered 2020-09-25 – 2020-09-26 (×4): 667 mg via ORAL
  Filled 2020-09-23 (×17): qty 1

## 2020-09-23 MED ORDER — SODIUM CHLORIDE 0.9% FLUSH
3.0000 mL | Freq: Two times a day (BID) | INTRAVENOUS | Status: DC
  Administered 2020-09-23 – 2020-09-27 (×7): 3 mL via INTRAVENOUS

## 2020-09-23 MED ORDER — PIPERACILLIN-TAZOBACTAM IN DEX 2-0.25 GM/50ML IV SOLN
2.2500 g | Freq: Three times a day (TID) | INTRAVENOUS | Status: DC
  Administered 2020-09-23 – 2020-09-26 (×9): 2.25 g via INTRAVENOUS
  Filled 2020-09-23 (×11): qty 50

## 2020-09-23 MED ORDER — HYDROXYZINE HCL 25 MG PO TABS
25.0000 mg | ORAL_TABLET | Freq: Every day | ORAL | Status: DC
  Administered 2020-09-23 – 2020-09-27 (×5): 25 mg via ORAL
  Filled 2020-09-23 (×6): qty 1

## 2020-09-23 MED ORDER — MORPHINE SULFATE (PF) 4 MG/ML IV SOLN
4.0000 mg | Freq: Once | INTRAVENOUS | Status: AC
Start: 2020-09-23 — End: 2020-09-23
  Administered 2020-09-23: 4 mg via INTRAVENOUS
  Filled 2020-09-23: qty 1

## 2020-09-23 MED ORDER — VANCOMYCIN HCL 1500 MG/300ML IV SOLN
1500.0000 mg | Freq: Once | INTRAVENOUS | Status: AC
  Administered 2020-09-23: 1500 mg via INTRAVENOUS
  Filled 2020-09-23: qty 300

## 2020-09-23 MED ORDER — VANCOMYCIN HCL IN DEXTROSE 1-5 GM/200ML-% IV SOLN
1000.0000 mg | Freq: Once | INTRAVENOUS | Status: AC
  Administered 2020-09-23: 1000 mg via INTRAVENOUS
  Filled 2020-09-23: qty 200

## 2020-09-23 MED ORDER — ONDANSETRON HCL 4 MG/2ML IJ SOLN
4.0000 mg | Freq: Once | INTRAMUSCULAR | Status: AC
  Administered 2020-09-23: 4 mg via INTRAVENOUS
  Filled 2020-09-23: qty 2

## 2020-09-23 MED ORDER — LIDOCAINE-PRILOCAINE 2.5-2.5 % EX CREA
TOPICAL_CREAM | Freq: Once | CUTANEOUS | Status: DC
  Filled 2020-09-23: qty 5

## 2020-09-23 MED ORDER — SEVELAMER CARBONATE 2.4 G PO PACK
2.4000 g | PACK | Freq: Three times a day (TID) | ORAL | Status: DC
  Administered 2020-09-23 – 2020-09-27 (×8): 2.4 g via ORAL
  Filled 2020-09-23 (×17): qty 1

## 2020-09-23 NOTE — Consult Note (Signed)
[facility] Specialist of [facility]  Patient name: [name] [name] MRN: [id number] DOB: 03/24/1976 Sex: male   REQUESTING PROVIDER:    ER   REASON FOR CONSULT:    Dialysis access complication  HISTORY OF PRESENT ILLNESS:   [name] [name] is a 45 y.o. 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  He noticed a opening with purulent drainage this am.  His last dialysis was Friday.  He denies fevers or chills.  He was admitted for IV antibiotics.  The patient is a nonsmoker.  He suffers from diabetes and hypertension.  PAST MEDICAL HISTORY    Past Medical History:  Diagnosis Date  . CHF (congestive heart failure) ([place])   . Coronary artery disease   . Diabetes mellitus without complication ([place])   . Dialysis patient [facility])    M-W-F  . GERD (gastroesophageal reflux disease)   . Hypertension   . Kyrle's disease   . Renal disorder    on dialysis for 4 years three times a week last was Tuesday  . Secondary hyperparathyroidism of renal origin ([place])   . Shortness of breath dyspnea   . Type 2 diabetes mellitus with diabetic nephropathy ([place])   . Type 2 diabetes mellitus with diabetic retinopathy ([place])      FAMILY HISTORY   Family History  Problem Relation Age of Onset  . Diabetes Father     SOCIAL HISTORY:   Social History   Socioeconomic History  . Marital status: Single    Spouse name: Not on file  . Number of children: Not on file  . Years of education: Not on file  . Highest education level: Not on file  Occupational History  . Not on file  Tobacco Use  . Smoking status: Never Smoker  . Smokeless tobacco: Never Used  Vaping Use  . Vaping Use: Never used  Substance and Sexual Activity  . Alcohol use: Not Currently    Comment: occ. shot  . Drug use: No  . Sexual activity: Not on file  Other Topics Concern  . Not on  file  Social History Narrative   Lives by himself   Social Determinants of Health   Financial Resource Strain: Not on file  Food Insecurity: Not on file  Transportation Needs: Not on file  Physical Activity: Not on file  Stress: Not on file  Social Connections: Not on file  Intimate Partner Violence: Not on file    ALLERGIES:    Allergies  Allergen Reactions  . Cephalexin Nausea Only  . Ivp Dye [Iodinated Diagnostic Agents] Diarrhea  . Lisinopril Other (See Comments)    Pt cannot recall reaction, cough  . Other     CURRENT MEDICATIONS:    Current Facility-Administered Medications  Medication Dose Route Frequency Provider Last Rate Last Admin  . 0.9 %  sodium chloride infusion  250 mL Intravenous PRN Agbata, Tochukwu, MD      . aspirin EC tablet 81 mg  81 mg Oral q1800 Agbata, Tochukwu, MD      . calcium acetate (PHOSLO) capsule 667 mg  667 mg Oral TID WC Agbata, Tochukwu, MD      . carvedilol (COREG) tablet 12.5 mg  12.5 mg Oral BID WC Agbata, Tochukwu, MD      . cinacalcet (SENSIPAR) tablet 60 mg  60 mg Oral q1800 Agbata, Tochukwu, MD      . heparin injection 5,000 Units  5,000 Units Subcutaneous Q8H Agbata, Tochukwu, MD      . hydrOXYzine (ATARAX/VISTARIL) tablet 25 mg  25 mg Oral QHS Agbata, Tochukwu, MD      . insulin aspart (novoLOG) injection 0-20 Units  0-20 Units Subcutaneous TID WC Agbata, Tochukwu, MD      . lidocaine-prilocaine (EMLA) cream   Topical Once Agbata, Tochukwu, MD      . losartan (COZAAR) tablet 50 mg  50 mg Oral Daily Agbata, Tochukwu, MD      . ondansetron (ZOFRAN) tablet 4 mg  4 mg Oral Q6H PRN Agbata, Tochukwu, MD       Or  . ondansetron (ZOFRAN) injection 4 mg  4 mg Intravenous Q6H PRN Agbata, Tochukwu, MD      . oxyCODONE-acetaminophen (PERCOCET/ROXICET) 5-325 MG per tablet 1 tablet  1 tablet Oral Q6H PRN Agbata, Tochukwu, MD      . pantoprazole (PROTONIX) EC tablet 40 mg  40 mg Oral Daily Agbata, Tochukwu, MD      . piperacillin-tazobactam  (ZOSYN) IVPB 2.25 g  2.25 g Intravenous Q8H Rauer, Samantha O, RPH      . sevelamer carbonate (RENVELA) powder PACK 2.4 g  2.4 g Oral TID WC Agbata, Tochukwu, MD      . sodium chloride flush (NS) 0.9 % injection 3 mL  3 mL Intravenous Q12H Agbata, Tochukwu, MD      . sodium chloride flush (NS) 0.9 % injection 3 mL  3 mL Intravenous PRN Agbata, Tochukwu, MD      . vancomycin (VANCOCIN) IVPB 1000 mg/200 mL premix  1,000 mg Intravenous Once Lavonia Drafts, MD 200 mL/hr at 09/23/20 1419 1,000 mg at 09/23/20 1419  . [START ON 09/24/2020] vancomycin (VANCOCIN) IVPB 1000 mg/200 mL premix  1,000 mg Intravenous Q M,W,F-HD Rauer, Forde Dandy, RPH      . vancomycin (VANCOREADY) IVPB 1500 mg/300 mL  1,500 mg Intravenous Once Rauer, Forde Dandy, RPH       Current Outpatient Medications  Medication Sig Dispense Refill  . aspirin EC 81 MG tablet Take 81 mg by mouth daily at 6 PM. (1700)    . Beta Carotene (VITAMIN A) 25000 UNIT capsule Take 25,000 Units by mouth daily.    . calcium acetate (PHOSLO) 667 MG capsule Take by mouth in the morning and at bedtime. Takes in AM (Patient not taking: No sig reported)    . carvedilol (COREG) 12.5 MG tablet Take 12.5 mg by mouth 2 (two) times daily with a meal.     . clindamycin (CLINDAGEL) 1 % gel Apply topically.    Marland Kitchen HUMULIN 70/30 (70-30) 100 UNIT/ML injection Inject 10-30 Units into the skin See admin instructions. 10 units before breakfast and 30 units before supper ON DIALYSIS DAYS (M-W-F), NO AM INSULIN    . hydrOXYzine (ATARAX/VISTARIL) 25 MG tablet Take 25 mg by mouth at bedtime.    . lidocaine-prilocaine (EMLA) cream Apply topically.    Marland Kitchen loperamide (IMODIUM) 2 MG capsule Take 2 mg by mouth as needed for diarrhea or loose stools (Take 1/2-1 tablet as meeded to prevent diarrhea. Can take up to 1 tablet every 4 hours. Do not exceed 4 tablets in a day.). (Patient not taking: No sig reported)    . losartan (COZAAR) 50 MG tablet Take 50 mg by mouth daily. (1700)    .  mupirocin ointment (BACTROBAN) 2 % Apply 1 application topically 2 (two) times daily. (Patient not taking: No sig reported) 22 g 1  . omeprazole (PRILOSEC) 20  MG capsule Take by mouth.    . oxyCODONE-acetaminophen (PERCOCET/ROXICET) 5-325 MG tablet Take 1 tablet by mouth every 6 (six) hours as needed for severe pain. 30 tablet 0  . SENSIPAR 60 MG tablet Take 60 mg by mouth daily at 6 PM. 1700  3  . sevelamer carbonate (RENVELA) 2.4 g PACK     . vitamin E 1000 UNIT capsule Take 1,000 Units by mouth daily. Not sure of dose; started 4/29      REVIEW OF SYSTEMS:   '[X]'$  denotes positive finding, '[ ]'$  denotes negative finding Cardiac  Comments:  Chest pain or chest pressure:    Shortness of breath upon exertion:    Short of breath when lying flat:    Irregular heart rhythm:        Vascular    Pain in calf, thigh, or hip brought on by ambulation:    Pain in feet at night that wakes you up from your sleep:     Blood clot in your veins:    Leg swelling:         Pulmonary    Oxygen at home:    Productive cough:     Wheezing:         Neurologic    Sudden weakness in arms or legs:     Sudden numbness in arms or legs:     Sudden onset of difficulty speaking or slurred speech:    Temporary loss of vision in one eye:     Problems with dizziness:         Gastrointestinal    Blood in stool:      Vomited blood:         Genitourinary    Burning when urinating:     Blood in urine:        Psychiatric    Major depression:         Hematologic    Bleeding problems:    Problems with blood clotting too easily:        Skin    Rashes or ulcers:        Constitutional    Fever or chills:     PHYSICAL EXAM:   Vitals:   09/23/20 1110 09/23/20 1111 09/23/20 1213 09/23/20 1400  BP:  (!) 151/87 (!) 160/93 136/84  Pulse:  82 81 80  Resp:  18 16   Temp:  98.9 F (37.2 C)    TempSrc:  Oral    SpO2:  98% 98% 99%  Weight: (!) 140 kg     Height: '6\' 1"'$  (1.854 m)       GENERAL: The patient  is a well-nourished male, in no acute distress. The vital signs are documented above. CARDIAC: There is a regular rate and rhythm.  VASCULAR: palpable thrill in left arm fistula.  Expressed fluid from ulcerated area.  Fistula not visualized PULMONARY: Nonlabored respirations ABDOMEN: Soft and non-tender with normal pitched bowel sounds.  MUSCULOSKELETAL: There are no major deformities or cyanosis. NEUROLOGIC: No focal weakness or paresthesias are detected. SKIN: See photo below. PSYCHIATRIC: The patient has a normal affect.      STUDIES:   none  ASSESSMENT and PLAN   Fistula ulcer:  Agree with admission for IV abx.  Patient will likely need excision of ulcer.  There is vein under the skin with a viabahn stent.  Neither is exposed.  I doubt this will heal on its own.  This fistula is nearing its end of life.  He has a catheter in place that can be used for dialysis.   Leia Alf, MD, FACS [facility]ists of [facility] [phone] Pager [phone]

## 2020-09-23 NOTE — ED Provider Notes (Signed)
[facility] Emergency Department Provider Note   ____________________________________________    I have reviewed the triage vital signs and the nursing notes.   HISTORY  Chief Complaint Abscess and Shoulder Pain     HPI [name] [name] is a 45 y.o. male with history of CHF, CAD, diabetes, end-stage renal disease who presents with complaints of possible infection to his left AV fistula.  Patient reports yesterday he noticed a "bubble "to the left arm at the bottom of his fistula.  It burst today and started draining purulent fluid and some blood.  He denies fevers or chills.  Denies erythema, had dialysis on Friday, he has Monday Wednesday Friday.  Separately he is noted pain in his right shoulder which developed yesterday as well, denies injury or redness  Past Medical History:  Diagnosis Date  . CHF (congestive heart failure) ([place])   . Coronary artery disease   . Diabetes mellitus without complication ([place])   . Dialysis patient [facility]    M-W-F  . GERD (gastroesophageal reflux disease)   . Hypertension   . Kyrle's disease   . Renal disorder    on dialysis for 4 years three times a week last was Tuesday  . Secondary hyperparathyroidism of renal origin ([place])   . Shortness of breath dyspnea   . Type 2 diabetes mellitus with diabetic nephropathy ([place])   . Type 2 diabetes mellitus with diabetic retinopathy [facility])     Patient Active Problem List   Diagnosis Date Noted  . Chronic venous insufficiency 02/17/2017  . Hypertension 07/24/2016  . Venous ulcer of left leg ([place]) 07/24/2016  . Uncontrolled type 2 diabetes mellitus with hyperglycemia, with long-term current use of insulin ([place]) 11/27/2015  . End stage renal disease ([place]) 11/02/2014  . Type 2 diabetes mellitus ([place]) 11/02/2014    Past Surgical History:  Procedure Laterality Date  . A/V FISTULAGRAM Left 10/01/2017   Procedure: A/V FISTULAGRAM;  Surgeon: Algernon Huxley, MD;  Location: [place] CV LAB;  Service: Cardiovascular;  Laterality: Left;  . A/V FISTULAGRAM Left 05/17/2020   Procedure: A/V FISTULAGRAM;  Surgeon: Algernon Huxley, MD;  Location: [place] CV LAB;  Service: Cardiovascular;  Laterality: Left;  . CATARACT EXTRACTION W/PHACO Right 06/30/2018   Procedure: CATARACT EXTRACTION PHACO AND INTRAOCULAR LENS PLACEMENT (IOC)-RIGHT;  Surgeon: Eulogio Bear, MD;  Location: [facility] ORS;  Service: Ophthalmology;  Laterality: Right;  [place] 00:07.0 CDE 0.16 FLUID PACK lOT # [id number]  . CATARACT EXTRACTION W/PHACO Left 12/24/2018   Procedure: CATARACT EXTRACTION PHACO AND INTRAOCULAR LENS PLACEMENT (IOC);  Surgeon: Eulogio Bear, MD;  Location: [facility] ORS;  Service: Ophthalmology;  Laterality: Left;  [place] 00:30.0 CDE 2.01 FLUID PACK LOT # [id number]  . COLONOSCOPY WITH PROPOFOL N/A 04/23/2017   Procedure: COLONOSCOPY WITH PROPOFOL;  Surgeon: [name], MD;  Location: [facility] ENDOSCOPY;  Service: Endoscopy;  Laterality: N/A;  . COLONOSCOPY WITH PROPOFOL N/A 07/02/2017   Procedure: COLONOSCOPY WITH PROPOFOL;  Surgeon: [name], MD;  Location: [facility]COPY;  Service: Endoscopy;  Laterality: N/A;  . ESOPHAGOGASTRODUODENOSCOPY (EGD) WITH PROPOFOL N/A 04/23/2017   Procedure: ESOPHAGOGASTRODUODENOSCOPY (EGD) WITH PROPOFOL;  Surgeon: [name], MD;  Location: [facility] ENDOSCOPY;  Service: Endoscopy;  Laterality: N/A;  . ESOPHAGOGASTRODUODENOSCOPY (EGD) WITH PROPOFOL N/A 03/27/2020   Procedure: ESOPHAGOGASTRODUODENOSCOPY (EGD) WITH PROPOFOL;  Surgeon: Lesly Rubenstein, MD;  Location: [facility] ENDOSCOPY;  Service: Endoscopy;  Laterality: N/A;  . EYE SURGERY     RETINA  .  INSERTION OF DIALYSIS CATHETER Right 08/09/2020   Procedure: INSERTION OF DIALYSIS CATHETER;  Surgeon: Algernon Huxley, MD;  Location: [facility] ORS;  Service: Vascular;  Laterality: Right;  . PERIPHERAL VASCULAR CATHETERIZATION N/A 05/07/2015   Procedure: A/V Shuntogram/Fistulagram;  Surgeon: Katha Cabal, MD;  Location: [place] CV LAB;  Service: Cardiovascular;  Laterality: N/A;  . PERIPHERAL VASCULAR CATHETERIZATION Left 05/07/2015   Procedure: A/V Shunt Intervention;  Surgeon: Katha Cabal, MD;  Location: [place] CV LAB;  Service: Cardiovascular;  Laterality: Left;  . PERIPHERAL VASCULAR THROMBECTOMY Left 07/16/2020   Procedure: PERIPHERAL VASCULAR THROMBECTOMY;  Surgeon: Algernon Huxley, MD;  Location: [place] CV LAB;  Service: Cardiovascular;  Laterality: Left;  . PERIPHERAL VASCULAR THROMBECTOMY Left 08/27/2020   Procedure: PERIPHERAL VASCULAR THROMBECTOMY;  Surgeon: Algernon Huxley, MD;  Location: [place] CV LAB;  Service: Cardiovascular;  Laterality: Left;  . REVISON OF ARTERIOVENOUS FISTULA Left 08/09/2020   Procedure: REVISON OF ARTERIOVENOUS FISTULA;  Surgeon: Algernon Huxley, MD;  Location: [facility] ORS;  Service: Vascular;  Laterality: Left;  . TEE WITHOUT CARDIOVERSION N/A 07/05/2015   Procedure: TRANSESOPHAGEAL ECHOCARDIOGRAM (TEE);  Surgeon: Dionisio David, MD;  Location: [facility] ORS;  Service: Cardiovascular;  Laterality: N/A;  . UPPER EXTREMITY ANGIOGRAPHY Left 06/24/2018   Procedure: UPPER EXTREMITY ANGIOGRAPHY;  Surgeon: Algernon Huxley, MD;  Location: [place] CV LAB;  Service: Cardiovascular;  Laterality: Left;    Prior to Admission medications   Medication Sig Start Date End Date Taking? Authorizing Provider  aspirin EC 81 MG tablet Take 81 mg by mouth daily at 6 PM. (1700) 01/14/16   [provider]  Beta Carotene (VITAMIN A) 25000 UNIT capsule Take 25,000 Units by mouth daily.    [provider]  calcium acetate (PHOSLO) 667 MG capsule Take by mouth in the morning and at bedtime. Takes in AM Patient not taking: No sig reported    [provider]  carvedilol (COREG) 12.5 MG tablet Take 12.5 mg by mouth 2 (two) times daily with a meal.     [provider]  clindamycin (CLINDAGEL) 1 % gel Apply topically. 08/24/20   [provider]  HUMULIN 70/30 (70-30) 100 UNIT/ML injection Inject 10-30 Units into the skin See admin instructions. 10 units before breakfast and 30 units before supper ON DIALYSIS DAYS (M-W-F), NO AM INSULIN 04/19/15   [provider]  hydrOXYzine (ATARAX/VISTARIL) 25 MG tablet Take 25 mg by mouth at bedtime. 08/23/20   [provider]  lidocaine-prilocaine (EMLA) cream Apply topically. 08/27/20 08/27/21  [provider]  loperamide (IMODIUM) 2 MG capsule Take 2 mg by mouth as needed for diarrhea or loose stools (Take 1/2-1 tablet as meeded to prevent diarrhea. Can take up to 1 tablet every 4 hours. Do not exceed 4 tablets in a day.). Patient not taking: No sig reported    [provider]  losartan (COZAAR) 50 MG tablet Take 50 mg by mouth daily. (1700) 05/22/18   [provider]  mupirocin ointment (BACTROBAN) 2 % Apply 1 application topically 2 (two) times daily. Patient not taking: No sig reported 08/23/20   Kris Hartmann, NP  omeprazole (PRILOSEC) 20 MG capsule Take by mouth. 03/01/20 03/01/21  [provider]  oxyCODONE-acetaminophen (PERCOCET/ROXICET) 5-325 MG tablet Take 1 tablet by mouth every 6 (six) hours as needed for severe pain. 08/23/20   Kris Hartmann, NP  SENSIPAR 60 MG tablet Take 60 mg by mouth daily at 6  PM. 1700 04/28/15   [provider]  sevelamer carbonate (RENVELA) 2.4 g PACK  02/27/20   [provider]  vitamin E 1000 UNIT capsule Take 1,000 Units by mouth daily. Not sure of dose; started 4/29    [provider]     Allergies Cephalexin, Ivp dye [iodinated diagnostic agents], Lisinopril, and Other  Family History  Problem Relation Age of Onset  . Diabetes Father     Social History Social History   Tobacco Use  . Smoking status: Never Smoker  . Smokeless tobacco: Never Used  Vaping Use  . Vaping Use: Never used  Substance Use Topics  . Alcohol use: Not Currently    Comment: occ. shot   . Drug use: No    Review of Systems  Constitutional: No fever/chills Eyes: No visual changes.  ENT: No sore throat. Cardiovascular: Denies chest pain. Respiratory: Denies shortness of breath. Gastrointestinal: No abdominal pain.  No nausea, no vomiting.   Genitourinary: Negative for dysuria. Musculoskeletal: As above. Skin: As above Neurological: Negative for headaches or weakness   ____________________________________________   PHYSICAL EXAM:  VITAL SIGNS: ED Triage Vitals  Enc Vitals Group     BP 09/23/20 1111 (!) 151/87     Pulse Rate 09/23/20 1111 82     Resp 09/23/20 1111 18     Temp 09/23/20 1111 98.9 F (37.2 C)     Temp Source 09/23/20 1111 Oral     SpO2 09/23/20 1111 98 %     Weight 09/23/20 1110 (!) 140 kg (308 lb 10.3 oz)     Height 09/23/20 1110 1.854 m ('6\' 1"'$ )     Head Circumference --      Peak Flow --      Pain Score 09/23/20 1116 10     Pain Loc --      Pain Edu? --      Excl. in [place]? --     Constitutional: Alert and oriented.  Nose: No congestion/rhinnorhea. Mouth/Throat: Mucous membranes are moist.   Neck:  Painless ROM Cardiovascular: Normal rate, regular rhythm. Grossly normal heart sounds.  Good peripheral circulation. Respiratory: Normal respiratory effort.  No retractions. Lungs CTAB. Gastrointestinal: Soft and nontender. No distention.  No CVA tenderness.  Musculoskeletal: Patient with tenderness palpation over the anterior superior right shoulder, no fluctuance redness or other abnormality, no swelling.  Able to range at the shoulder but painful Neurologic:  Normal speech and language. No gross focal neurologic deficits are appreciated.  Skin:  Skin is warm, dry.  Likely small abscess at the inferior aspect of the left AV fistula, see image   Psychiatric: Mood and affect are normal. Speech and behavior are normal.  ____________________________________________   LABS (all labs ordered are listed, but only abnormal results are  displayed)  Labs Reviewed  CBC WITH DIFFERENTIAL/PLATELET - Abnormal; Notable for the following components:      Result Value   RBC 3.62 (*)    Hemoglobin 10.0 (*)    HCT 32.4 (*)    All other components within normal limits  COMPREHENSIVE METABOLIC PANEL - Abnormal; Notable for the following components:   Sodium 133 (*)    Chloride 95 (*)    Glucose, Bld 261 (*)    BUN 44 (*)    Creatinine, Ser 14.69 (*)    Calcium 7.3 (*)    AST 11 (*)    GFR, Estimated 4 (*)    All other components within normal limits  AEROBIC CULTURE [place]  STAIN (SUPERFICIAL SPECIMEN)  LACTIC ACID, PLASMA  LACTIC ACID, PLASMA   ____________________________________________  EKG   ____________________________________________  RADIOLOGY  Right shoulder x-ray is unremarkable ____________________________________________   PROCEDURES  Procedure(s) performed: No  Procedures   Critical Care performed: No ____________________________________________   INITIAL IMPRESSION / ASSESSMENT AND PLAN / ED COURSE  Pertinent labs & imaging results that were available during my care of the patient were reviewed by me and considered in my medical decision making (see chart for details).  Patient with possible abscess to the left AV fistula.  Lactic acid white blood cell count normal.  Patient is afebrile.  Small mount of purulent drainage expressed, wound culture sent  Discussed with Dr. Trula Slade of vascular surgery who recommends broad-spectrum IV antibiotics and admission for possible procedure, he will see the patient in the ED.  Unclear cause of patient's right shoulder pain, suspect musculoskeletal injury, no evidence of septic joint, no swelling or redness fever or elevated lactic acid.  Treated with small dose of IV morphine and Zofran with improvement in pain.    ____________________________________________   FINAL CLINICAL IMPRESSION(S) / ED DIAGNOSES  Final diagnoses:  Infection of  arteriovenous fistula, initial encounter [facility])        Note:  This document was prepared using Dragon voice recognition software and may include unintentional dictation errors.   Lavonia Drafts, MD 09/23/20 1327

## 2020-09-23 NOTE — Progress Notes (Signed)
Pharmacy Antibiotic Note  [name] [name] is a 45 y.o. male with history of HTN, CAD, HTN, DM, PVD, and ESRD on HD (MWF) admitted on 09/23/2020 with wound infection (possibly from AV fistula). Pt AV fistula has been used successfully and the plan was for the PermCath to be removed 09/27/20. Pt reported he noticed a "bubble" on his left arm at the bottom of his fistula. Pt states it burst today and started draining purulent fluid and some blood.  Pharmacy has been consulted for Vancomycin and Zosyn dosing.  Plan:  Pt received 1g IV vanc in ED - will give additional 1500 mg for a total loading dose of 2500 mg followed by vancomycin 1g IV MWF after dialysis  Obtain vancomycin levels around 4th or 5th dose if continued  Zosyn 2.25 g IV q8h  Follow up cultures and pt clinical course and de-escalate antibiotics as appropriate  Height: '6\' 1"'$  (185.4 cm) Weight: (!) 140 kg (308 lb 10.3 oz) IBW/kg (Calculated) : 79.9  Temp (24hrs), Avg:98.9 F (37.2 C), Min:98.9 F (37.2 C), Max:98.9 F (37.2 C)  Recent Labs  Lab 09/23/20 1212  WBC 8.3  CREATININE 14.69*  LATICACIDVEN 1.0    Estimated Creatinine Clearance: 9.2 mL/min (A) (by C-G formula based on SCr of 14.69 mg/dL (H)).    Allergies  Allergen Reactions  . Cephalexin Nausea Only  . Ivp Dye [Iodinated Diagnostic Agents] Diarrhea  . Lisinopril Other (See Comments)    Pt cannot recall reaction, cough  . Other     Antimicrobials this admission: 5/29 Vancomycin >> 5/29 Zosyn >>  Dose adjustments this admission:   Microbiology results: 5/29 Wound culture: sent  Thank you for allowing pharmacy to be a part of this patient's care.  Sherilyn Banker, PharmD Pharmacy Resident  09/23/2020 2:53 PM

## 2020-09-23 NOTE — H&P (Signed)
History and Physical    [name] [name] [id number] DOB: October 11, 1975 DOA: 09/23/2020  PCP: [name] Berry, NP   Patient coming from: Home  I have personally briefly reviewed patient's old medical records in [place]  Chief Complaint: Abscess over left AV fistula  HPI: [name] [name] is a 45 y.o. male with medical history significant for diabetes mellitus with complications of end stage renal disease on hemodialysis, dialysis days are Monday/Wednesday/Friday, hypertension, coronary artery disease and CHF who presents to the ER for evaluation of purulent drainage from around his left AV fistula. Patient initially had issues with his left AV fistula and so had a PermCath placed in April for dialysis.  His AV fistula has been used successfully and the plan was for the PermCath to be removed on 09/27/20. Per patient he noticed a "bubble" at the bottom of his AV fistula and infected on the morning of his admission and started draining purulent material and blood.  This AV fistula was used on Friday for dialysis and he states that everything was fine on that day. He also complains of pain in his right shoulder and has an area of induration and point tenderness over his right shoulder.   He denies having any fever or chills, no chest pain, no shortness of breath, no nausea, no vomiting, no lower extremity swelling, no dizziness, no lightheadedness, no abdominal pain, no changes in his bowel habits, no cough, no palpitations or diaphoresis. Labs show sodium 133, potassium 4.9, chloride 95, bicarb 23, glucose 261, BUN 44, creatinine 14.6, calcium 7.3, alkaline phosphatase 77, albumin 3.6, AST 11, ALT 7, total protein 8.1, lactic acid 1.0, white count 8.3, hemoglobin 10.0 MCV 89.5, RDW 14.9, platelet count 225 Respiratory viral panel is pending Right shoulder x-ray shows no acute fracture, malalignment or significant arthropathy.     ED Course: Patient is a 45 y.o. African-American  male who presents to the ER for evaluation of an ulcerated area in close proximity to his left AV fistula with purulent drainage. Patient received empiric antibiotic therapy with vancomycin and Zosyn and will be admitted to the hospital for further evaluation.  Review of Systems: As per HPI otherwise all other systems reviewed and negative.    Past Medical History:  Diagnosis Date  . CHF (congestive heart failure) ([place])   . Coronary artery disease   . Diabetes mellitus without complication ([place])   . Dialysis patient [facility])    M-W-F  . GERD (gastroesophageal reflux disease)   . Hypertension   . Kyrle's disease   . Renal disorder    on dialysis for 4 years three times a week last was Tuesday  . Secondary hyperparathyroidism of renal origin ([place])   . Shortness of breath dyspnea   . Type 2 diabetes mellitus with diabetic nephropathy ([place])   . Type 2 diabetes mellitus with diabetic retinopathy [facility])     Past Surgical History:  Procedure Laterality Date  . A/V FISTULAGRAM Left 10/01/2017   Procedure: A/V FISTULAGRAM;  Surgeon: Algernon Huxley, MD;  Location: [place] [facility];  Service: Cardiovascular;  Laterality: Left;  . A/V FISTULAGRAM Left 05/17/2020   Procedure: A/V FISTULAGRAM;  Surgeon: Algernon Huxley, MD;  Location: [place] [facility];  Service: Cardiovascular;  Laterality: Left;  . CATARACT EXTRACTION W/PHACO Right 06/30/2018   Procedure: CATARACT EXTRACTION PHACO AND INTRAOCULAR LENS PLACEMENT (IOC)-RIGHT;  Surgeon: Eulogio Bear, MD;  Location: [facility] ORS;  Service: Ophthalmology;  Laterality: Right;  [place] 00:07.0 CDE  0.16 FLUID PACK lOT # [id number]  . CATARACT EXTRACTION W/PHACO Left 12/24/2018   Procedure: CATARACT EXTRACTION PHACO AND INTRAOCULAR LENS PLACEMENT (IOC);  Surgeon: Eulogio Bear, MD;  Location: [facility];  Service: Ophthalmology;  Laterality: Left;  [place] 00:30.0 CDE 2.01 FLUID PACK LOT # [id number]  . COLONOSCOPY WITH PROPOFOL N/A 04/23/2017   Procedure:  COLONOSCOPY WITH PROPOFOL;  Surgeon: [name], MD;  [facility] ENDOSCOPY;  Service: Endoscopy;  Laterality: N/A;  . COLONOSCOPY WITH PROPOFOL N/A 07/02/2017   Procedure: COLONOSCOPY WITH PROPOFOL;  Surgeon: [name], MD;  Location: [facility] ENDOSCOPY;  Service: Endoscopy;  Laterality: N/A;  . ESOPHAGOGASTRODUODENOSCOPY (EGD) WITH PROPOFOL N/A 04/23/2017   Procedure: ESOPHAGOGASTRODUODENOSCOPY (EGD) WITH PROPOFOL;  Surgeon: [name], MD;  Location: [facility] ENDOSCOPY;  Service: Endoscopy;  Laterality: N/A;  . ESOPHAGOGASTRODUODENOSCOPY (EGD) WITH PROPOFOL N/A 03/27/2020   Procedure: ESOPHAGOGASTRODUODENOSCOPY (EGD) WITH PROPOFOL;  Surgeon: Lesly Rubenstein, MD;  Location: [facility] ENDOSCOPY;  Service: Endoscopy;  Laterality: N/A;  . EYE SURGERY     RETINA  . INSERTION OF DIALYSIS CATHETER Right 08/09/2020   Procedure: INSERTION OF DIALYSIS CATHETER;  Surgeon: Algernon Huxley, MD;  Location: [facility]S;  Service: Vascular;  Laterality: Right;  . PERIPHERAL VASCULAR CATHETERIZATION N/A 05/07/2015   Procedure: A/V Shuntogram/Fistulagram;  Surgeon: Katha Cabal, MD;  Location: [place] [facility];  Service: Cardiovascular;  Laterality: N/A;  . PERIPHERAL VASCULAR CATHETERIZATION Left 05/07/2015   Procedure: A/V Shunt Intervention;  Surgeon: Katha Cabal, MD;  Location: [place] [facility];  Service: Cardiovascular;  Laterality: Left;  . PERIPHERAL VASCULAR THROMBECTOMY Left 07/16/2020   Procedure: PERIPHERAL VASCULAR THROMBECTOMY;  Surgeon: Algernon Huxley, MD;  Location: [place] [facility];  Service: Cardiovascular;  Laterality: Left;  . PERIPHERAL VASCULAR THROMBECTOMY Left 08/27/2020   Procedure: PERIPHERAL VASCULAR THROMBECTOMY;  Surgeon: Algernon Huxley, MD;  Location: [place] [facility];  Service: Cardiovascular;  Laterality: Left;  . REVISON OF ARTERIOVENOUS FISTULA Left 08/09/2020   Procedure: REVISON OF ARTERIOVENOUS FISTULA;  Surgeon: Algernon Huxley, MD;  Location: [facility];   Service: Vascular;  Laterality: Left;  . TEE WITHOUT CARDIOVERSION N/A 07/05/2015   Procedure: TRANSESOPHAGEAL ECHOCARDIOGRAM (TEE);  Surgeon: Dionisio David, MD;  Location: [facility];  Service: Cardiovascular;  Laterality: N/A;  . UPPER EXTREMITY ANGIOGRAPHY Left 06/24/2018   Procedure: UPPER EXTREMITY ANGIOGRAPHY;  Surgeon: Algernon Huxley, MD;  Location: [place] [facility];  Service: Cardiovascular;  Laterality: Left;     reports that he has never smoked. He has never used smokeless tobacco. He reports previous alcohol use. He reports that he does not use drugs.  Allergies  Allergen Reactions  . Cephalexin Nausea Only  . Ivp Dye [Iodinated Diagnostic Agents] Diarrhea  . Lisinopril Other (See Comments)    Pt cannot recall reaction, cough  . Other     Family History  Problem Relation Age of Onset  . Diabetes Father       Prior to Admission medications   Medication Sig Start Date End Date Taking? Authorizing Provider  aspirin EC 81 MG tablet Take 81 mg by mouth daily at 6 PM. (1700) 01/14/16   [provider]  Beta Carotene (VITAMIN A) 25000 UNIT capsule Take 25,000 Units by mouth daily.    [provider]  calcium acetate (PHOSLO) 667 MG capsule Take by mouth in the morning and at bedtime. Takes in AM Patient not taking: No sig reported    [provider]  carvedilol (COREG) 12.5 MG tablet Take 12.5 mg by mouth 2 (two) times daily with a meal.     [provider]  clindamycin (CLINDAGEL) 1 % gel Apply topically. 08/24/20   [provider]  HUMULIN 70/30 (70-30) 100 UNIT/ML injection Inject 10-30 Units into the skin See admin instructions. 10 units before breakfast and 30 units before supper ON DIALYSIS DAYS (M-W-F), NO AM INSULIN 04/19/15   [provider]  hydrOXYzine (ATARAX/VISTARIL) 25 MG tablet Take 25 mg by mouth at bedtime. 08/23/20   [provider]  lidocaine-prilocaine (EMLA) cream Apply topically. 08/27/20 08/27/21   [provider]  loperamide (IMODIUM) 2 MG capsule Take 2 mg by mouth as needed for diarrhea or loose stools (Take 1/2-1 tablet as meeded to prevent diarrhea. Can take up to 1 tablet every 4 hours. Do not exceed 4 tablets in a day.). Patient not taking: No sig reported    [provider]  losartan (COZAAR) 50 MG tablet Take 50 mg by mouth daily. (1700) 05/22/18   [provider]  mupirocin ointment (BACTROBAN) 2 % Apply 1 application topically 2 (two) times daily. Patient not taking: No sig reported 08/23/20   Kris Hartmann, NP  omeprazole (PRILOSEC) 20 MG capsule Take by mouth. 03/01/20 03/01/21  [provider]  oxyCODONE-acetaminophen (PERCOCET/ROXICET) 5-325 MG tablet Take 1 tablet by mouth every 6 (six) hours as needed for severe pain. 08/23/20   Kris Hartmann, NP  SENSIPAR 60 MG tablet Take 60 mg by mouth daily at 6 PM. 1700 04/28/15   [provider]  sevelamer carbonate (RENVELA) 2.4 g PACK  02/27/20   [provider]  vitamin E 1000 UNIT capsule Take 1,000 Units by mouth daily. Not sure of dose; started 4/29    [provider]    Physical Exam: Vitals:   09/23/20 1110 09/23/20 1111 09/23/20 1213 09/23/20 1400  BP:  (!) 151/87 (!) 160/93 136/84  Pulse:  82 81 80  Resp:  18 16   Temp:  98.9 F (37.2 C)    TempSrc:  Oral    SpO2:  98% 98% 99%  Weight: (!) 140 kg     Height: '6\' 1"'$  (1.854 m)        Vitals:   09/23/20 1110 09/23/20 1111 09/23/20 1213 09/23/20 1400  BP:  (!) 151/87 (!) 160/93 136/84  Pulse:  82 81 80  Resp:  18 16   Temp:  98.9 F (37.2 C)    TempSrc:  Oral    SpO2:  98% 98% 99%  Weight: (!) 140 kg     Height: '6\' 1"'$  (1.854 m)         Constitutional: Alert and oriented x 3. Not in any apparent distress.  Looks older than stated age. HEENT:      Head: Normocephalic and atraumatic.         Eyes: PERLA, EOMI, Conjunctivae pallor. Sclera is non-icteric.       Mouth/Throat: Mucous membranes are  moist.       Neck: Supple with no signs of meningismus. Cardiovascular: Regular rate and rhythm. No murmurs, gallops, or rubs. 2+ symmetrical distal pulses are present . No JVD. No LE edema Respiratory: Respiratory effort normal .Lungs sounds clear bilaterally. No wheezes, crackles, or rhonchi.  Gastrointestinal: Soft, non tender, and non distended with positive bowel sounds.  Genitourinary: No CVA tenderness. Musculoskeletal: Ulceration at the bottom of the left AV fistula with purulent drainage.  Area of point tenderness  over the right shoulder with differential warmth, no cyanosis, or erythema of extremities. Neurologic:  Face is symmetric. Moving all extremities. No gross focal neurologic deficits  Skin: Skin is warm, dry.  No rash or ulcers Psychiatric: Mood and affect are normal   Labs on Admission: I have personally reviewed following labs and imaging studies  CBC: Recent Labs  Lab 09/23/20 1212  WBC 8.3  NEUTROABS 6.4  HGB 10.0*  HCT 32.4*  MCV 89.5  PLT 123456   Basic Metabolic Panel: Recent Labs  Lab 09/23/20 1212  NA 133*  K 4.9  CL 95*  CO2 23  GLUCOSE 261*  BUN 44*  CREATININE 14.69*  CALCIUM 7.3*   GFR: Estimated Creatinine Clearance: 9.2 mL/min (A) (by C-G formula based on SCr of 14.69 mg/dL (H)). Liver Function Tests: Recent Labs  Lab 09/23/20 1212  AST 11*  ALT 7  ALKPHOS 77  BILITOT 0.8  PROT 8.1  ALBUMIN 3.6   No results for input(s): LIPASE, AMYLASE in the last 168 hours. No results for input(s): AMMONIA in the last 168 hours. Coagulation Profile: No results for input(s): INR, PROTIME in the last 168 hours. Cardiac Enzymes: No results for input(s): CKTOTAL, CKMB, CKMBINDEX, TROPONINI in the last 168 hours. BNP (last 3 results) No results for input(s): PROBNP in the last 8760 hours. HbA1C: No results for input(s): HGBA1C in the last 72 hours. CBG: No results for input(s): GLUCAP in the last 168 hours. Lipid Profile: No results for  input(s): CHOL, HDL, LDLCALC, TRIG, CHOLHDL, LDLDIRECT in the last 72 hours. Thyroid Function Tests: No results for input(s): TSH, T4TOTAL, FREET4, T3FREE, THYROIDAB in the last 72 hours. Anemia Panel: No results for input(s): VITAMINB12, FOLATE, FERRITIN, TIBC, IRON, RETICCTPCT in the last 72 hours. Urine analysis:    Component Value Date/Time   COLORURINE Yellow 02/22/2014 1640   APPEARANCEUR Clear 02/22/2014 1640   LABSPEC 1.012 02/22/2014 1640   PHURINE 8.0 02/22/2014 1640   GLUCOSEU >=500 02/22/2014 1640   HGBUR Negative 02/22/2014 1640   BILIRUBINUR Negative 02/22/2014 1640   KETONESUR Negative 02/22/2014 1640   PROTEINUR >=500 02/22/2014 1640   NITRITE Negative 02/22/2014 1640   LEUKOCYTESUR Negative 02/22/2014 1640    Radiological Exams on Admission: DG Shoulder Right  Result Date: 09/23/2020 CLINICAL DATA:  45 year old male with right shoulder pain. EXAM: RIGHT SHOULDER - 2+ VIEW COMPARISON:  None. FINDINGS: The indwelling tunneled hemodialysis catheter overlies the right shoulder on the AP projection. There is no evidence of fracture or dislocation. There is no evidence of arthropathy or other focal bone abnormality. Soft tissues are unremarkable. IMPRESSION: No acute fracture, malalignment, or significant arthropathy. Electronically Signed   By: Ruthann Cancer MD   On: 09/23/2020 12:20     Assessment/Plan Principal Problem:   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[place]) Active Problems:   Diabetes mellitus with ESRD (end-stage renal disease) ([place])   Hypertension   Coronary artery disease   Obesity, Class III, BMI 40-49.9 (morbid obesity) ([place])     AV fistula infection We will place patient on empiric antibiotic therapy  with vancomycin and Zosyn. Follow-up results of blood cultures Consult vascular surgery     Diabetes mellitus with complications of end-stage renal disease on hemodialysis Dialysis days are Monday/Wednesday/Friday We will request nephrology consult for renal  replacement therapy Maintain consistent carbohydrate diet Check blood sugars before meals and at bedtime     History of CHF/hypertension Continue carvedilol and losartan    Morbid obesity (BMI 0000000 kg/m2) Complicates overall  prognosis and care   DVT prophylaxis: Heparin Code Status: full code Family Communication: Greater than 50% of time was spent discussing plan of care with patient at the bedside.  All questions and concerns have been addressed.  He verbalizes understanding and agrees with the plan. Disposition Plan: Back to previous home environment Consults called: Nephrology/vascular surgery Status: At the time of admission, it appears that the appropriate admission status for this patient is inpatient. This is judged to be reasonable and necessary in order to provide the required intensity of service to ensure the patient's safety given the presenting symptoms, physical exam findings and initial radiographic and laboratory data in the context of their comorbid condition. Patient requires inpatient status due to high intensity of service, high risk for further deterioration and high frequency of surveillance required.    Collier Bullock MD [facility]     09/23/2020, 2:40 PM

## 2020-09-23 NOTE — ED Notes (Signed)
Patient transported to X-ray 

## 2020-09-23 NOTE — ED Triage Notes (Signed)
Pt c/o right shoulder pain. Denies injury. Patient has visible abscess on left arm where he received dialysis. Reports it drained some today after placing wet cloth on area. M,W,R dialysis trx. Last had treatment Friday with no issues

## 2020-09-23 NOTE — Consult Note (Signed)
Referring Provider: No ref. provider found Primary Care Physician:  Danelle Berry, NP Primary Nephrologist:  Dr. Radene Knee  Reason for Consultation: ESRD  HPI: 45 year old male with history of hypertension, coronary artery disease, congestive heart failure, diabetes, peripheral vascular disease, end-stage renal disease on dialysis.  Patient is now being admitted for possible sepsis and cellulitis.  He presents with history of right shoulder pain and also history of small ulcerated area in close proximity to the left-sided AV fistula.  The AV fistula has been used successfully and he was scheduled to have the permacath removed next week.  He denies any history of fever, chills, headaches, chest pain, shortness of breath or dyspnea on exertion.  He is on a Monday Wednesday Friday schedule for dialysis. In the emergency department he was started on Zosyn and also vancomycin.  Past Medical History:  Diagnosis Date  . CHF (congestive heart failure) ([place])   . Coronary artery disease   . Diabetes mellitus without complication ([place])   . Dialysis patient [facility])    M-W-F  . GERD (gastroesophageal reflux disease)   . Hypertension   . Kyrle's disease   . Renal disorder    on dialysis for 4 years three times a week last was Tuesday  . Secondary hyperparathyroidism of renal origin ([place])   . Shortness of breath dyspnea   . Type 2 diabetes mellitus with diabetic nephropathy ([place])   . Type 2 diabetes mellitus with diabetic retinopathy [facility])     Past Surgical History:  Procedure Laterality Date  . A/V FISTULAGRAM Left 10/01/2017   Procedure: A/V FISTULAGRAM;  Surgeon: Algernon Huxley, MD;  Location: [place] [facility];  Service: Cardiovascular;  Laterality: Left;  . A/V FISTULAGRAM Left 05/17/2020   Procedure: A/V FISTULAGRAM;  Surgeon: Algernon Huxley, MD;  Location: [place] [facility];  Service: Cardiovascular;  Laterality: Left;  . CATARACT EXTRACTION W/PHACO Right 06/30/2018   Procedure: CATARACT  EXTRACTION PHACO AND INTRAOCULAR LENS PLACEMENT (IOC)-RIGHT;  Surgeon: Eulogio Bear, MD;  Location: [facility] ORS;  Service: Ophthalmology;  Laterality: Right;  [place] 00:07.0 CDE 0.16 FLUID PACK lOT # [id number]  . CATARACT EXTRACTION W/PHACO Left 12/24/2018   Procedure: CATARACT EXTRACTION PHACO AND INTRAOCULAR LENS PLACEMENT (IOC);  Surgeon: Eulogio Bear, MD;  Location: [facility] ORS;  Service: Ophthalmology;  Laterality: Left;  [place] 00:30.0 CDE 2.01 FLUID PACK LOT # [id number]  . COLONOSCOPY WITH PROPOFOL N/A 04/23/2017   Procedure: COLONOSCOPY WITH PROPOFOL;  Surgeon: [name], MD;  Location: [facility] END[facility]COPY;  Service: Endoscopy;  Laterality: N/A;  . COLONOSCOPY WITH PROPOFOL N/A 07/02/2017   Procedure: COLONOSCOPY WITH PROPOFOL;  Surgeon: [name], MD;  Location: [facility] ENDOSCOPY;  Service: Endoscopy;  Laterality: N/A;  . ESOPHAGOGASTRODUODENOSCOPY (EGD) WITH PROPOFOL N/A 04/23/2017   Procedure: ESOPHAGOGASTRODUODENOSCOPY (EGD) WITH PROPOFOL;  Surgeon: [name], MD;  Location: [facility] ENDOSCOPY;  Service: Endoscopy;  Laterality: N/A;  . ESOPHAGOGASTRODUODENOSCOPY (EGD) WITH PROPOFOL N/A 03/27/2020   Procedure: ESOPHAGOGASTRODUODENOSCOPY (EGD) WITH PROPOFOL;  Surgeon: Lesly Rubenstein, MD;  Location: [facility] ENDOSCOPY;  Service: Endoscopy;  Laterality: N/A;  . EYE SURGERY     RETINA  . INSERTION OF DIALYSIS CATHETER Right 08/09/2020   Procedure: INSERTION OF DIALYSIS CATHETER;  Surgeon: Algernon Huxley, MD;  Location: [facility] ORS;  Service: Vascular;  Laterality: Right;  . PERIPHERAL VASCULAR CATHETERIZATION N/A 05/07/2015   Procedure: A/V Shuntogram/Fistulagram;  Surgeon: Katha Cabal, MD;  Location: [place] [facility];  Service: Cardiovascular;  Laterality: N/A;  .  PERIPHERAL VASCULAR CATHETERIZATION Left 05/07/2015   Procedure: A/V Shunt Intervention;  Surgeon: Katha Cabal, MD;  Location: [place] [facility];  Service: Cardiovascular;  Laterality: Left;  . PERIPHERAL  VASCULAR THROMBECTOMY Left 07/16/2020   Procedure: PERIPHERAL VASCULAR THROMBECTOMY;  Surgeon: Algernon Huxley, MD;  Location: [place] [facility];  Service: Cardiovascular;  Laterality: Left;  . PERIPHERAL VASCULAR THROMBECTOMY Left 08/27/2020   Procedure: PERIPHERAL VASCULAR THROMBECTOMY;  Surgeon: Algernon Huxley, MD;  Location: [place] [facility];  Service: Cardiovascular;  Laterality: Left;  . REVISON OF ARTERIOVENOUS FISTULA Left 08/09/2020   Procedure: REVISON OF ARTERIOVENOUS FISTULA;  Surgeon: Algernon Huxley, MD;  Location: [facility] ORS;  Service: Vascular;  Laterality: Left;  . TEE WITHOUT CARDIOVERSION N/A 07/05/2015   Procedure: TRANSESOPHAGEAL ECHOCARDIOGRAM (TEE);  Surgeon: Dionisio David, MD;  Location: [facility] ORS;  Service: Cardiovascular;  Laterality: N/A;  . UPPER EXTREMITY ANGIOGRAPHY Left 06/24/2018   Procedure: UPPER EXTREMITY ANGIOGRAPHY;  Surgeon: Algernon Huxley, MD;  Location: [place] [facility];  Service: Cardiovascular;  Laterality: Left;    Prior to Admission medications   Medication Sig Start Date End Date Taking? Authorizing Provider  aspirin EC 81 MG tablet Take 81 mg by mouth daily at 6 PM. (1700) 01/14/16   [provider]  Beta Carotene (VITAMIN A) 25000 UNIT capsule Take 25,000 Units by mouth daily.    [provider]  calcium acetate (PHOSLO) 667 MG capsule Take by mouth in the morning and at bedtime. Takes in AM Patient not taking: No sig reported    [provider]  carvedilol (COREG) 12.5 MG tablet Take 12.5 mg by mouth 2 (two) times daily with a meal.     [provider]  clindamycin (CLINDAGEL) 1 % gel Apply topically. 08/24/20   [provider]  HUMULIN 70/30 (70-30) 100 UNIT/ML injection Inject 10-30 Units into the skin See admin instructions. 10 units before breakfast and 30 units before supper ON DIALYSIS DAYS (M-W-F), NO AM INSULIN 04/19/15   [provider]  hydrOXYzine (ATARAX/VISTARIL) 25 MG tablet Take 25 mg by  mouth at bedtime. 08/23/20   [provider]  lidocaine-prilocaine (EMLA) cream Apply topically. 08/27/20 08/27/21  [provider]  loperamide (IMODIUM) 2 MG capsule Take 2 mg by mouth as needed for diarrhea or loose stools (Take 1/2-1 tablet as meeded to prevent diarrhea. Can take up to 1 tablet every 4 hours. Do not exceed 4 tablets in a day.). Patient not taking: No sig reported    [provider]  losartan (COZAAR) 50 MG tablet Take 50 mg by mouth daily. (1700) 05/22/18   [provider]  mupirocin ointment (BACTROBAN) 2 % Apply 1 application topically 2 (two) times daily. Patient not taking: No sig reported 08/23/20   Kris Hartmann, NP  omeprazole (PRILOSEC) 20 MG capsule Take by mouth. 03/01/20 03/01/21  [provider]  oxyCODONE-acetaminophen (PERCOCET/ROXICET) 5-325 MG tablet Take 1 tablet by mouth every 6 (six) hours as needed for severe pain. 08/23/20   Kris Hartmann, NP  SENSIPAR 60 MG tablet Take 60 mg by mouth daily at 6 PM. 1700 04/28/15   [provider]  sevelamer carbonate (RENVELA) 2.4 g PACK  02/27/20   [provider]  vitamin E 1000 UNIT capsule Take 1,000 Units by mouth daily. Not sure of dose; started 4/29    [provider]    Current Facility-Administered Medications  Medication Dose Route Frequency Provider Last Rate Last  Admin  . piperacillin-tazobactam (ZOSYN) IVPB 3.375 g  3.375 g Intravenous Once Lavonia Drafts, MD 100 mL/hr at 09/23/20 1338 3.375 g at 09/23/20 1338  . vancomycin (VANCOCIN) IVPB 1000 mg/200 mL premix  1,000 mg Intravenous Once Lavonia Drafts, MD       Current Outpatient Medications  Medication Sig Dispense Refill  . aspirin EC 81 MG tablet Take 81 mg by mouth daily at 6 PM. (1700)    . Beta Carotene (VITAMIN A) 25000 UNIT capsule Take 25,000 Units by mouth daily.    . calcium acetate (PHOSLO) 667 MG capsule Take by mouth in the morning and at bedtime. Takes in AM (Patient not  taking: No sig reported)    . carvedilol (COREG) 12.5 MG tablet Take 12.5 mg by mouth 2 (two) times daily with a meal.     . clindamycin (CLINDAGEL) 1 % gel Apply topically.    Marland Kitchen HUMULIN 70/30 (70-30) 100 UNIT/ML injection Inject 10-30 Units into the skin See admin instructions. 10 units before breakfast and 30 units before supper ON DIALYSIS DAYS (M-W-F), NO AM INSULIN    . hydrOXYzine (ATARAX/VISTARIL) 25 MG tablet Take 25 mg by mouth at bedtime.    . lidocaine-prilocaine (EMLA) cream Apply topically.    Marland Kitchen loperamide (IMODIUM) 2 MG capsule Take 2 mg by mouth as needed for diarrhea or loose stools (Take 1/2-1 tablet as meeded to prevent diarrhea. Can take up to 1 tablet every 4 hours. Do not exceed 4 tablets in a day.). (Patient not taking: No sig reported)    . losartan (COZAAR) 50 MG tablet Take 50 mg by mouth daily. (1700)    . mupirocin ointment (BACTROBAN) 2 % Apply 1 application topically 2 (two) times daily. (Patient not taking: No sig reported) 22 g 1  . omeprazole (PRILOSEC) 20 MG capsule Take by mouth.    . oxyCODONE-acetaminophen (PERCOCET/ROXICET) 5-325 MG tablet Take 1 tablet by mouth every 6 (six) hours as needed for severe pain. 30 tablet 0  . SENSIPAR 60 MG tablet Take 60 mg by mouth daily at 6 PM. 1700  3  . sevelamer carbonate (RENVELA) 2.4 g PACK     . vitamin E 1000 UNIT capsule Take 1,000 Units by mouth daily. Not sure of dose; started 4/29      Allergies as of 09/23/2020 - Review Complete 09/23/2020  Allergen Reaction Noted  . Cephalexin Nausea Only 12/02/2018  . Ivp dye [iodinated diagnostic agents] Diarrhea 04/23/2017  . Lisinopril Other (See Comments) 05/07/2015  . Other  02/02/2020    Family History  Problem Relation Age of Onset  . Diabetes Father     Social History   Socioeconomic History  . Marital status: Single    Spouse name: Not on file  . Number of children: Not on file  . Years of education: Not on file  . Highest education level: Not on file   Occupational History  . Not on file  Tobacco Use  . Smoking status: Never Smoker  . Smokeless tobacco: Never Used  Vaping Use  . Vaping Use: Never used  Substance and Sexual Activity  . Alcohol use: Not Currently    Comment: occ. shot  . Drug use: No  . Sexual activity: Not on file  Other Topics Concern  . Not on file  Social History Narrative   Lives by himself   Social Determinants of Health   Financial Resource Strain: Not on file  Food Insecurity: Not on file  Transportation Needs:  Not on file  Physical Activity: Not on file  Stress: Not on file  Social Connections: Not on file  Intimate Partner Violence: Not on file    Review of Systems: Gen: Denies any fever, chills, sweats, anorexia, fatigue, weakness, malaise, weight loss, and sleep disorder HEENT: No visual complaints, No history of Retinopathy. Normal external appearance No Epistaxis or Sore throat. No sinusitis.   CV: Denies chest pain, angina, palpitations, syncope, orthopnea, PND, peripheral edema, and claudication. Resp: Denies dyspnea at rest, dyspnea with exercise, cough, sputum, wheezing, coughing up blood, and pleurisy. GI: Denies vomiting blood, jaundice, and fecal incontinence.   Denies dysphagia or odynophagia. GU : Denies urinary burning, blood in urine, urinary frequency, urinary hesitancy, nocturnal urination, and urinary incontinence.  No renal calculi. MS: Denies joint pain, limitation of movement, and swelling, stiffness, low back pain, extremity pain. Denies muscle weakness, cramps, atrophy.  No use of non steroidal antiinflammatory drugs. Derm: Denies rash, itching, dry skin, hives, moles, warts, or unhealing ulcers.  Psych: Denies depression, anxiety, memory loss, suicidal ideation, hallucinations, paranoia, and confusion. Heme: Denies bruising, bleeding, and enlarged lymph nodes. Neuro: No headache.  No diplopia. No dysarthria.  No dysphasia.  No history of CVA.  No Seizures. No paresthesias.   No weakness. Endocrine No DM.  No Thyroid disease.  No Adrenal disease.  Physical Exam: Vital signs in last 24 hours: Temp:  [98.9 F (37.2 C)] 98.9 F (37.2 C) (05/29 1111) Pulse Rate:  [81-82] 81 (05/29 1213) Resp:  [16-18] 16 (05/29 1213) BP: (151-160)/(87-93) 160/93 (05/29 1213) SpO2:  [98 %] 98 % (05/29 1213) Weight:  [140 kg] 140 kg (05/29 1110)   General:   Alert,  Well-developed, well-nourished, pleasant and cooperative in NAD Head:  Normocephalic and atraumatic. Eyes:  Sclera clear, no icterus.   Conjunctiva pink. Ears:  Normal auditory acuity. Nose:  No deformity, discharge,  or lesions. Mouth:  No deformity or lesions, dentition normal. Neck:  Supple; no masses or thyromegaly. JVP not elevated Lungs:  Clear throughout to auscultation.   No wheezes, crackles, or rhonchi. No acute distress. Heart:  Regular rate and rhythm; no murmurs, clicks, rubs,  or gallops. Abdomen:  Soft, nontender and nondistended. No masses, hepatosplenomegaly or hernias noted. Normal bowel sounds, without guarding, and without rebound.   Msk:  Symmetrical without gross deformities. Normal posture. Pulses:  No carotid, renal, femoral bruits. DP and PT symmetrical and equal Extremities: Ulcerated area discharge close to the AV fistula on the left arm. Neurologic:  Alert and  oriented x4;  grossly normal neurologically. Skin:  Intact without significant lesions or rashes. Cervical Nodes:  No significant cervical adenopathy. Psych:  Alert and cooperative. Normal mood and affect.  Intake/Output from previous day: No intake/output data recorded. Intake/Output this shift: No intake/output data recorded.  Lab Results: Recent Labs    09/23/20 1212  WBC 8.3  HGB 10.0*  HCT 32.4*  PLT 225   BMET Recent Labs    09/23/20 1212  NA 133*  K 4.9  CL 95*  CO2 23  GLUCOSE 261*  BUN 44*  CREATININE 14.69*  CALCIUM 7.3*   LFT Recent Labs    09/23/20 1212  PROT 8.1  ALBUMIN 3.6  AST 11*   ALT 7  ALKPHOS 77  BILITOT 0.8   PT/INR No results for input(s): LABPROT, INR in the last 72 hours. Hepatitis Panel No results for input(s): HEPBSAG, HCVAB, HEPAIGM, HEPBIGM in the last 72 hours.  Studies/Results: DG Shoulder Right  Result Date: 09/23/2020 CLINICAL DATA:  45 year old male with right shoulder pain. EXAM: RIGHT SHOULDER - 2+ VIEW COMPARISON:  None. FINDINGS: The indwelling tunneled hemodialysis catheter overlies the right shoulder on the AP projection. There is no evidence of fracture or dislocation. There is no evidence of arthropathy or other focal bone abnormality. Soft tissues are unremarkable. IMPRESSION: No acute fracture, malalignment, or significant arthropathy. Electronically Signed   By: Ruthann Cancer MD   On: 09/23/2020 12:20    Assessment/Plan:  45 year old male with history of hypertension, coronary artery disease, congestive heart failure, diabetes, peripheral vascular disease, end-stage renal disease on dialysis.  Patient is now being admitted for possible sepsis and cellulitis.  He presents with history of right shoulder pain and also history of small ulcerated area in close proximity to the left-sided AV fistula.   #1 ESRD: We will schedule him for dialysis tomorrow morning.  Permacath.  Consent obtained from the patient.  #2  AV fistula area abscess: We will continue the antibiotics as ordered.  Vascular to see patient today.  #3 anemia: We will follow anemia protocol with dialysis treatments  I spoke to the patient and his mother at bedside in detail and obtain consent for dialysis.   LOS: 0 Lyla Son, MD [facility] '@TODAY''@1'$ :44 PM

## 2020-09-24 ENCOUNTER — Encounter: Payer: Self-pay | Admitting: Internal Medicine

## 2020-09-24 ENCOUNTER — Encounter (INDEPENDENT_AMBULATORY_CARE_PROVIDER_SITE_OTHER): Payer: Self-pay | Admitting: Nurse Practitioner

## 2020-09-24 DIAGNOSIS — I10 Essential (primary) hypertension: Secondary | ICD-10-CM | POA: Diagnosis not present

## 2020-09-24 DIAGNOSIS — T827XXA Infection and inflammatory reaction due to other cardiac and vascular devices, implants and grafts, initial encounter: Secondary | ICD-10-CM | POA: Diagnosis not present

## 2020-09-24 DIAGNOSIS — N186 End stage renal disease: Secondary | ICD-10-CM | POA: Diagnosis not present

## 2020-09-24 LAB — CBC
HCT: 30.6 % — ABNORMAL LOW (ref 39.0–52.0)
Hemoglobin: 9.6 g/dL — ABNORMAL LOW (ref 13.0–17.0)
MCH: 28.2 pg (ref 26.0–34.0)
MCHC: 31.4 g/dL (ref 30.0–36.0)
MCV: 90 fL (ref 80.0–100.0)
Platelets: 210 10*3/uL (ref 150–400)
RBC: 3.4 MIL/uL — ABNORMAL LOW (ref 4.22–5.81)
RDW: 14.8 % (ref 11.5–15.5)
WBC: 8 10*3/uL (ref 4.0–10.5)
nRBC: 0 % (ref 0.0–0.2)

## 2020-09-24 LAB — BASIC METABOLIC PANEL
Anion gap: 14 (ref 5–15)
BUN: 53 mg/dL — ABNORMAL HIGH (ref 6–20)
CO2: 24 mmol/L (ref 22–32)
Calcium: 7.3 mg/dL — ABNORMAL LOW (ref 8.9–10.3)
Chloride: 98 mmol/L (ref 98–111)
Creatinine, Ser: 16.42 mg/dL — ABNORMAL HIGH (ref 0.61–1.24)
GFR, Estimated: 3 mL/min — ABNORMAL LOW (ref >60)
Glucose, Bld: 117 mg/dL — ABNORMAL HIGH (ref 70–99)
Potassium: 4.7 mmol/L (ref 3.5–5.1)
Sodium: 136 mmol/L (ref 135–145)

## 2020-09-24 LAB — GLUCOSE, CAPILLARY
Glucose-Capillary: 151 mg/dL — ABNORMAL HIGH (ref 70–99)
Glucose-Capillary: 145 mg/dL — ABNORMAL HIGH (ref 70–99)
Glucose-Capillary: 147 mg/dL — ABNORMAL HIGH (ref 70–99)

## 2020-09-24 LAB — PHOSPHORUS: Phosphorus: 7.9 mg/dL — ABNORMAL HIGH (ref 2.5–4.6)

## 2020-09-24 LAB — HIV ANTIBODY (ROUTINE TESTING W REFLEX): HIV Screen 4th Generation wRfx: NONREACTIVE

## 2020-09-24 LAB — ALBUMIN: Albumin: 3.2 g/dL — ABNORMAL LOW (ref 3.5–5.0)

## 2020-09-24 LAB — SARS CORONAVIRUS 2 (TAT 6-24 HRS): SARS Coronavirus 2: NEGATIVE

## 2020-09-24 MED ORDER — SODIUM CHLORIDE 0.9 % IV SOLN: 100.0000 mL | INTRAVENOUS | Status: DC | PRN

## 2020-09-24 MED ORDER — CHLORHEXIDINE GLUCONATE CLOTH 2 % EX PADS
6.0000 | MEDICATED_PAD | Freq: Every day | CUTANEOUS | Status: DC
  Administered 2020-09-24: 6 via TOPICAL

## 2020-09-24 MED ORDER — CHLORHEXIDINE GLUCONATE CLOTH 2 % EX PADS: 6.0000 | MEDICATED_PAD | Freq: Every day | CUTANEOUS | Status: DC

## 2020-09-24 MED ORDER — HEPARIN SODIUM (PORCINE) 1000 UNIT/ML DIALYSIS
1000.0000 [IU] | INTRAMUSCULAR | Status: DC | PRN
  Filled 2020-09-24: qty 1

## 2020-09-24 MED ORDER — PENTAFLUOROPROP-TETRAFLUOROETH EX AERO
1.0000 "application " | INHALATION_SPRAY | CUTANEOUS | Status: DC | PRN
  Filled 2020-09-24: qty 30

## 2020-09-24 MED ORDER — LIDOCAINE HCL (PF) 1 % IJ SOLN
5.0000 mL | INTRAMUSCULAR | Status: DC | PRN
  Filled 2020-09-24: qty 5

## 2020-09-24 MED ORDER — LIDOCAINE-PRILOCAINE 2.5-2.5 % EX CREA
1.0000 "application " | TOPICAL_CREAM | CUTANEOUS | Status: DC | PRN
  Filled 2020-09-24: qty 5

## 2020-09-24 MED ORDER — ALTEPLASE 2 MG IJ SOLR
2.0000 mg | Freq: Once | INTRAMUSCULAR | Status: DC | PRN
  Filled 2020-09-24: qty 2

## 2020-09-24 NOTE — TOC Progression Note (Signed)
Transition of Care [facility]) - Progression Note    Patient Details  Name: JERON LAVY MRN: [id number] Date of Birth: [birth year]  Transition of Care [facility]) CM/SW [place], RN Phone Number: 09/24/2020, 1:05 PM  Clinical Narrative:   Attempted to see patient x 2.  Patient was off floor in dialysis.         Expected Discharge Plan and Services                                                 Social Determinants of Health (SDOH) Interventions    Readmission Risk Interventions No flowsheet data found.

## 2020-09-24 NOTE — Progress Notes (Signed)
Subjective:    Patient ID: [name] [name], male    DOB: 02/14/1976, 45 y.o.   MRN: [id number] Chief Complaint  Patient presents with  . Follow-up    [facility] 3 week post peripheral vascular thrombectomy    The patient returns to the office for followup status post intervention of the dialysis access brachiocephalic AV fistula with jump graft revision. Following the intervention the access function has significantly improved, with better flow rates and improved KT/V. The patient has not been experiencing increased bleeding times following decannulation and the patient denies increased recirculation. The patient denies an increase in arm swelling. At the present time the patient denies hand pain.  The patient notes he has been doing well enough to have his PermCath removed.  The patient denies amaurosis fugax or recent TIA symptoms. There are no recent neurological changes noted. The patient denies claudication symptoms or rest pain symptoms. The patient denies history of DVT, PE or superficial thrombophlebitis. The patient denies recent episodes of angina or shortness of breath.    Today the patient has a flow volume of 2199.  Previously placed stents are open and patent no areas of significant stenosis.     Review of Systems  Skin: Negative for wound.  Hematological: Does not bruise/bleed easily.  All other systems reviewed and are negative.      Objective:   Physical Exam Vitals reviewed.  HENT:     Head: Normocephalic.  Cardiovascular:     Rate and Rhythm: Normal rate.     Pulses: Normal pulses.     Arteriovenous access: left arteriovenous access is present.    Comments: Good thrill and bruit Pulmonary:     Effort: Pulmonary effort is normal.  Neurological:     Mental Status: He is alert and oriented to person, place, and time.  Psychiatric:        Mood and Affect: Mood normal.        Behavior: Behavior normal.        Thought Content: Thought content normal.         Judgment: Judgment normal.     BP (!) 149/73 (BP Location: Right Arm)   Pulse 89   Resp 16   Wt 278 lb 12.8 oz (126.5 kg)   BMI 36.78 kg/m   Past Medical History:  Diagnosis Date  . CHF (congestive heart failure) ([place])   . Coronary artery disease   . Diabetes mellitus without complication ([place])   . Dialysis patient [facility])    M-W-F  . GERD (gastroesophageal reflux disease)   . Hypertension   . Kyrle's disease   . Renal disorder    on dialysis for 4 years three times a week last was Tuesday  . Secondary hyperparathyroidism of renal origin ([place])   . Shortness of breath dyspnea   . Type 2 diabetes mellitus with diabetic nephropathy ([place])   . Type 2 diabetes mellitus with diabetic retinopathy ([place])     Social History   Socioeconomic History  . Marital status: Single    Spouse name: Not on file  . Number of children: Not on file  . Years of education: Not on file  . Highest education level: Not on file  Occupational History  . Not on file  Tobacco Use  . Smoking status: Never Smoker  . Smokeless tobacco: Never Used  Vaping Use  . Vaping Use: Never used  Substance and Sexual Activity  . Alcohol use: Not Currently    Comment: occ.  shot  . Drug use: No  . Sexual activity: Not on file  Other Topics Concern  . Not on file  Social History Narrative   Lives by himself   Social Determinants of Health   Financial Resource Strain: Not on file  Food Insecurity: Not on file  Transportation Needs: Not on file  Physical Activity: Not on file  Stress: Not on file  Social Connections: Not on file  Intimate Partner Violence: Not on file    Past Surgical History:  Procedure Laterality Date  . A/V FISTULAGRAM Left 10/01/2017   Procedure: A/V FISTULAGRAM;  Surgeon: Algernon Huxley, MD;  Location: [place] CV LAB;  Service: Cardiovascular;  Laterality: Left;  . A/V FISTULAGRAM Left 05/17/2020   Procedure: A/V FISTULAGRAM;  Surgeon: Algernon Huxley, MD;  Location: [place]  CV LAB;  Service: Cardiovascular;  Laterality: Left;  . CATARACT EXTRACTION W/PHACO Right 06/30/2018   Procedure: CATARACT EXTRACTION PHACO AND INTRAOCULAR LENS PLACEMENT (IOC)-RIGHT;  Surgeon: Eulogio Bear, MD;  Location: [facility] ORS;  Service: Ophthalmology;  Laterality: Right;  [place] 00:07.0 CDE 0.16 FLUID PACK lOT # [id number]  . CATARACT EXTRACTION W/PHACO Left 12/24/2018   Procedure: CATARACT EXTRACTION PHACO AND INTRAOCULAR LENS PLACEMENT (IOC);  Surgeon: Eulogio Bear, MD;  Location: [facility] ORS;  Service: Ophthalmology;  Laterality: Left;  [place] 00:30.0 CDE 2.01 FLUID PACK LOT # [id number]  . COLONOSCOPY WITH PROPOFOL N/A 04/23/2017   Procedure: COLONOSCOPY WITH PROPOFOL;  Surgeon: [name], MD;  Location: [facility] ENDOSCOPY;  Service: Endoscopy;  Laterality: N/A;  . COLONOSCOPY WITH PROPOFOL N/A 07/02/2017   Procedure: COLONOSCOPY WITH PROPOFOL;  Surgeon: [name], MD;  Location: [facility] ENDOSCOPY;  Service: Endoscopy;  Laterality: N/A;  . ESOPHAGOGASTRODUODENOSCOPY (EGD) WITH PROPOFOL N/A 04/23/2017   Procedure: ESOPHAGOGASTRODUODENOSCOPY (EGD) WITH PROPOFOL;  Surgeon: [name], MD;  Location: [facility] ENDOSCOPY;  Service: Endoscopy;  Laterality: N/A;  . ESOPHAGOGASTRODUODENOSCOPY (EGD) WITH PROPOFOL N/A 03/27/2020   Procedure: ESOPHAGOGASTRODUODENOSCOPY (EGD) WITH PROPOFOL;  Surgeon: Lesly Rubenstein, MD;  Location: [facility] ENDOSCOPY;  Service: Endoscopy;  Laterality: N/A;  . EYE SURGERY     RETINA  . INSERTION OF DIALYSIS CATHETER Right 08/09/2020   Procedure: INSERTION OF DIALYSIS CATHETER;  Surgeon: Algernon Huxley, MD;  Location: [facility] ORS;  Service: Vascular;  Laterality: Right;  . PERIPHERAL VASCULAR CATHETERIZATION N/A 05/07/2015   Procedure: A/V Shuntogram/Fistulagram;  Surgeon: Katha Cabal, MD;  Location: [place] CV LAB;  Service: Cardiovascular;  Laterality: N/A;  . PERIPHERAL VASCULAR CATHETERIZATION Left 05/07/2015   Procedure: A/V Shunt Intervention;   Surgeon: Katha Cabal, MD;  Location: [place] CV LAB;  Service: Cardiovascular;  Laterality: Left;  . PERIPHERAL VASCULAR THROMBECTOMY Left 07/16/2020   Procedure: PERIPHERAL VASCULAR THROMBECTOMY;  Surgeon: Algernon Huxley, MD;  Location: [place] CV LAB;  Service: Cardiovascular;  Laterality: Left;  . PERIPHERAL VASCULAR THROMBECTOMY Left 08/27/2020   Procedure: PERIPHERAL VASCULAR THROMBECTOMY;  Surgeon: Algernon Huxley, MD;  Location: [place] CV LAB;  Service: Cardiovascular;  Laterality: Left;  . REVISON OF ARTERIOVENOUS FISTULA Left 08/09/2020   Procedure: REVISON OF ARTERIOVENOUS FISTULA;  Surgeon: Algernon Huxley, MD;  Location: [facility] ORS;  Service: Vascular;  Laterality: Left;  . TEE WITHOUT CARDIOVERSION N/A 07/05/2015   Procedure: TRANSESOPHAGEAL ECHOCARDIOGRAM (TEE);  Surgeon: Dionisio David, MD;  Location: [place] ORS;  Service: Cardiovascular;  Laterality: N/A;  . UPPER EXTREMITY ANGIOGRAPHY Left 06/24/2018   Procedure: UPPER EXTREMITY ANGIOGRAPHY;  Surgeon: Algernon Huxley,  MD;  Location: [place] CV LAB;  Service: Cardiovascular;  Laterality: Left;    Family History  Problem Relation Age of Onset  . Diabetes Father     Allergies  Allergen Reactions  . Cephalexin Nausea Only  . Ivp Dye [Iodinated Diagnostic Agents] Diarrhea  . Lisinopril Other (See Comments)    Pt cannot recall reaction, cough  . Other     CBC Latest Ref Rng & Units 09/24/2020 09/23/2020 08/09/2020  WBC 4.0 - 10.5 K/uL 8.0 8.3 6.8  Hemoglobin 13.0 - 17.0 g/dL 9.6(L) 10.0(L) 14.1  Hematocrit 39.0 - 52.0 % 30.6(L) 32.4(L) 44.5  Platelets 150 - 400 K/uL 210 225 198      CMP     Component Value Date/Time   NA 136 09/24/2020 0433   NA 138 02/24/2014 0359   K 4.7 09/24/2020 0433   K 3.5 02/24/2014 0359   CL 98 09/24/2020 0433   CL 97 (L) 02/24/2014 0359   CO2 24 09/24/2020 0433   CO2 31 02/24/2014 0359   GLUCOSE 117 (H) 09/24/2020 0433   GLUCOSE 231 (H) 02/24/2014 0359   BUN 53 (H)  09/24/2020 0433   BUN 38 (H) 02/24/2014 0359   CREATININE 16.42 (H) 09/24/2020 0433   CREATININE 12.04 (H) 02/24/2014 0359   CALCIUM 7.3 (L) 09/24/2020 0433   CALCIUM 7.9 (L) 02/24/2014 0359   PROT 8.1 09/23/2020 1212   PROT 7.3 02/22/2014 1526   ALBUMIN 3.2 (L) 09/24/2020 0433   ALBUMIN 3.2 (L) 02/22/2014 1526   AST 11 (L) 09/23/2020 1212   AST 27 02/22/2014 1526   ALT 7 09/23/2020 1212   ALT 17 02/22/2014 1526   ALKPHOS 77 09/23/2020 1212   ALKPHOS 73 02/22/2014 1526   BILITOT 0.8 09/23/2020 1212   BILITOT 0.8 02/22/2014 1526   GFRNONAA 3 (L) 09/24/2020 0433   GFRNONAA 5 (L) 02/24/2014 0359   GFRAA 3 (L) 05/07/2015 0937   GFRAA 6 (L) 02/24/2014 0359     No results found.     Assessment & Plan:   1. End stage renal disease ([place]) Recommend:  The patient is doing well and currently has adequate dialysis access. The patient's dialysis center is not reporting any access issues. Flow pattern is stable when compared to the prior ultrasound.  The patient should have a duplex ultrasound of the dialysis access in 6 months. The patient will follow-up with me in the office after each ultrasound     2. Diabetes mellitus with ESRD (end-stage renal disease) ([place]) Continue hypoglycemic medications as already ordered, these medications have been reviewed and there are no changes at this time.  Hgb A1C to be monitored as already arranged by primary service   3. Primary hypertension Continue antihypertensive medications as already ordered, these medications have been reviewed and there are no changes at this time.    No current facility-administered medications on file prior to visit.   Current Outpatient Medications on File Prior to Visit  Medication Sig Dispense Refill  . aspirin EC 81 MG tablet Take 81 mg by mouth daily at 6 PM. (1700)    . Beta Carotene (VITAMIN A) 25000 UNIT capsule Take 25,000 Units by mouth daily.    . carvedilol (COREG) 12.5 MG tablet Take 12.5 mg by  mouth 2 (two) times daily with a meal.     . clindamycin (CLINDAGEL) 1 % gel Apply topically.    Marland Kitchen HUMULIN 70/30 (70-30) 100 UNIT/ML injection Inject 10-30 Units into the skin See admin  instructions. 10 units before breakfast and 30 units before supper ON DIALYSIS DAYS (M-W-F), NO AM INSULIN    . hydrOXYzine (ATARAX/VISTARIL) 25 MG tablet Take 25 mg by mouth at bedtime.    . lidocaine-prilocaine (EMLA) cream Apply topically.    Marland Kitchen losartan (COZAAR) 50 MG tablet Take 50 mg by mouth daily. (1700)    . omeprazole (PRILOSEC) 20 MG capsule Take 20 mg by mouth daily.    Marland Kitchen oxyCODONE-acetaminophen (PERCOCET/ROXICET) 5-325 MG tablet Take 1 tablet by mouth every 6 (six) hours as needed for severe pain. (Patient not taking: Reported on 09/23/2020) 30 tablet 0  . SENSIPAR 60 MG tablet Take 60 mg by mouth daily at 6 PM. 1700  3  . sevelamer carbonate (RENVELA) 2.4 g PACK     . vitamin E 1000 UNIT capsule Take 1,000 Units by mouth daily. Not sure of dose; started 4/29    . calcium acetate (PHOSLO) 667 MG capsule Take by mouth in the morning and at bedtime. Takes in AM (Patient not taking: No sig reported)    . loperamide (IMODIUM) 2 MG capsule Take 2 mg by mouth as needed for diarrhea or loose stools (Take 1/2-1 tablet as meeded to prevent diarrhea. Can take up to 1 tablet every 4 hours. Do not exceed 4 tablets in a day.). (Patient not taking: No sig reported)    . mupirocin ointment (BACTROBAN) 2 % Apply 1 application topically 2 (two) times daily. (Patient not taking: No sig reported) 22 g 1    There are no Patient Instructions on file for this visit. No follow-ups on file.   Kris Hartmann, NP

## 2020-09-24 NOTE — Progress Notes (Addendum)
Progress Note    [name] [name]  [id number] DOB: [birth year]  DOA: 09/23/2020 PCP: Danelle Berry, NP      Brief Narrative:    Medical records reviewed and are as summarized below:  [name] [name] is a 45 y.o. male with medical history significant for diabetes mellitus with complications of end stage renal disease on hemodialysis, dialysis days are Monday/Wednesday/Friday, hypertension, Kyrle's disease, coronary artery disease and CHF, who presented to the hospital for evaluation of purulent drainage from around his left AV fistula. Patient initially had issues with his left AV fistula and so had a PermCath placed in April for dialysis.  His AV fistula has been used successfully and the plan was for the PermCath to be removed on 09/27/20.  He said he noticed a palpable at the AV fistula site and also noticed drainage of purulent bloody discharge from an ulcer at the AV fistula site.  He was admitted to the hospital for left AV fistula ulcer/infection.  He was started on empiric IV antibiotics.  Vascular surgery was consulted to assist with management.   Assessment/Plan:   Principal Problem:   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HCC) Active Problems:   End stage renal disease (HCC)   Diabetes mellitus with ESRD (end-stage renal disease) (HCC)   Hypertension   Coronary artery disease   Obesity, Class III, BMI 40-49.9 (morbid obesity) ([place])   Body mass index is 40.72 kg/m.  (Morbid obesity)    Left AV fistula ulcer/infection: Continue empiric IV antibiotics.  Plan for excision of ulcer.  Follow-up with vascular surgeon.  ESRD: Patient will have dialysis today.  Follow-up with nephrologist.  Chronic diastolic CHF/hypertension: Continue antihypertensives  Type II DM: NovoLog as needed for hyperglycemia.  CAD: Continue aspirin  Diet Order            Diet Carb Modified Fluid consistency: Thin; Room service appropriate? Yes  Diet effective now                     Consultants:  Nephrologist  Vascular surgeon  Procedures:  None    Medications:   . aspirin EC  81 mg Oral q1800  . calcium acetate  667 mg Oral TID WC  . carvedilol  12.5 mg Oral BID WC  . Chlorhexidine Gluconate Cloth  6 each Topical Daily  . cinacalcet  60 mg Oral q1800  . heparin  5,000 Units Subcutaneous Q8H  . hydrOXYzine  25 mg Oral QHS  . insulin aspart  0-20 Units Subcutaneous TID WC  . lidocaine-prilocaine   Topical Once  . losartan  50 mg Oral Daily  . pantoprazole  40 mg Oral Daily  . sevelamer carbonate  2.4 g Oral TID WC  . sodium chloride flush  3 mL Intravenous Q12H   Continuous Infusions: . sodium chloride    . sodium chloride    . sodium chloride    . piperacillin-tazobactam (ZOSYN)  IV 2.25 g (09/24/20 0624)  . vancomycin       Anti-infectives (From admission, onward)   Start     Dose/Rate Route Frequency Ordered Stop   09/24/20 1200  vancomycin (VANCOCIN) IVPB 1000 mg/200 mL premix        1,000 mg 200 mL/hr over 60 Minutes Intravenous Every M-W-F (Hemodialysis) 09/23/20 1446     09/23/20 2200  piperacillin-tazobactam (ZOSYN) IVPB 2.25 g        2.25 g 100 mL/hr over 30 Minutes Intravenous Every 8 hours 09/23/20  1446     09/23/20 1530  vancomycin (VANCOREADY) IVPB 1500 mg/300 mL        1,500 mg 150 mL/hr over 120 Minutes Intravenous  Once 09/23/20 1446 09/23/20 1755   09/23/20 1315  vancomycin (VANCOCIN) IVPB 1000 mg/200 mL premix        1,000 mg 200 mL/hr over 60 Minutes Intravenous  Once 09/23/20 1314 09/23/20 1522   09/23/20 1315  piperacillin-tazobactam (ZOSYN) IVPB 3.375 g        3.375 g 100 mL/hr over 30 Minutes Intravenous  Once 09/23/20 1314 09/23/20 1415             Family Communication/Anticipated D/C date and plan/Code Status   DVT prophylaxis: heparin injection 5,000 Units Start: 09/23/20 2200     Code Status: Full Code  Family Communication: None Disposition Plan:    Status is: Inpatient  Remains  inpatient appropriate because:Inpatient level of care appropriate due to severity of illness   Dispo: The patient is from: Home              Anticipated d/c is to: Home              Patient currently is not medically stable to d/c.   Difficult to place patient No           Subjective:   Interval events noted.  He has no complaints.  Objective:    Vitals:   09/24/20 0059 09/24/20 0536 09/24/20 0750 09/24/20 1015  BP: (!) 155/93 (!) 148/92 (!) 148/92 (!) 185/101  Pulse: 81 81 79 80  Resp: '18 16 16 16  '$ Temp: 98.7 F (37.1 C) 98.6 F (37 C) 98.7 F (37.1 C)   TempSrc:      SpO2: 96% 98% 97%   Weight:      Height:       No data found.   Intake/Output Summary (Last 24 hours) at 09/24/2020 1204 Last data filed at 09/24/2020 0300 Gross per 24 hour  Intake 90.08 ml  Output --  Net 90.08 ml   Filed Weights   09/23/20 1110  Weight: (!) 140 kg    Exam:  GEN: NAD SKIN: Warm and dry.  Multiple hyperpigmented patchy scarred areas on the skin.  Permacath on right upper chest wall. EYES: EOMI ENT: MMM CV: RRR PULM: CTA B ABD: soft, ND, NT, +BS CNS: AAO x 3, non focal EXT: Left arm AV fistula with palpable thrill and audible bruit.  Fistula ulcer on left antecubital area        Data Reviewed:   I have personally reviewed following labs and imaging studies:  Labs: Labs show the following:   Basic Metabolic Panel: Recent Labs  Lab 09/23/20 1212 09/24/20 0433  NA 133* 136  K 4.9 4.7  CL 95* 98  CO2 23 24  GLUCOSE 261* 117*  BUN 44* 53*  CREATININE 14.69* 16.42*  CALCIUM 7.3* 7.3*  PHOS  --  7.9*   GFR Estimated Creatinine Clearance: 8.3 mL/min (A) (by C-G formula based on SCr of 16.42 mg/dL (H)). Liver Function Tests: Recent Labs  Lab 09/23/20 1212 09/24/20 0433  AST 11*  --   ALT 7  --   ALKPHOS 77  --   BILITOT 0.8  --   PROT 8.1  --   ALBUMIN 3.6 3.2*   No results for input(s): LIPASE, AMYLASE in the last 168 hours. No results for  input(s): AMMONIA in the last 168 hours. Coagulation profile No results for  input(s): INR, PROTIME in the last 168 hours.  CBC: Recent Labs  Lab 09/23/20 1212 09/24/20 0433  WBC 8.3 8.0  NEUTROABS 6.4  --   HGB 10.0* 9.6*  HCT 32.4* 30.6*  MCV 89.5 90.0  PLT 225 210   Cardiac Enzymes: No results for input(s): CKTOTAL, CKMB, CKMBINDEX, TROPONINI in the last 168 hours. BNP (last 3 results) No results for input(s): PROBNP in the last 8760 hours. CBG: Recent Labs  Lab 09/23/20 1831 09/23/20 2112 09/24/20 0822  GLUCAP 219* 105* 151*   D-Dimer: No results for input(s): DDIMER in the last 72 hours. Hgb A1c: No results for input(s): HGBA1C in the last 72 hours. Lipid Profile: No results for input(s): CHOL, HDL, LDLCALC, TRIG, CHOLHDL, LDLDIRECT in the last 72 hours. Thyroid function studies: No results for input(s): TSH, T4TOTAL, T3FREE, THYROIDAB in the last 72 hours.  Invalid input(s): FREET3 Anemia work up: No results for input(s): VITAMINB12, FOLATE, FERRITIN, TIBC, IRON, RETICCTPCT in the last 72 hours. Sepsis Labs: Recent Labs  Lab 09/23/20 1212 09/23/20 1904 09/24/20 0433  WBC 8.3  --  8.0  LATICACIDVEN 1.0 0.8  --     Microbiology Recent Results (from the past 240 hour(s))  Aerobic Culture w Gram Stain (superficial specimen)     Status: None (Preliminary result)   Collection Time: 09/23/20  1:25 PM   Specimen: Hemodialysis Fistula  Result Value Ref Range Status   Specimen Description   Final    HEMODIALYSIS FISTULA Performed at [facility], [address] [place], [place] [address]    Special Requests   Final    NONE Performed at [facility], [address] [place], [place] [address]    Gram Stain PENDING  Incomplete   Culture   Final    NO GROWTH < 12 HOURS Performed at [facility], [place] [address] [place], [place] [address]    Report Status PENDING  Incomplete  MRSA PCR Screening     Status: None   Collection  Time: 09/23/20  9:15 PM   Specimen: Nasopharyngeal  Result Value Ref Range Status   MRSA by PCR NEGATIVE NEGATIVE Final    Comment:        The GeneXpert MRSA Assay (FDA approved for NASAL specimens only), is one component of a comprehensive MRSA colonization surveillance program. It is not intended to diagnose MRSA infection nor to guide or monitor treatment for MRSA infections. Performed at [facility], [address], [place], [place] [address]     Procedures and diagnostic studies:  DG Shoulder Right  Result Date: 09/23/2020 CLINICAL DATA:  45 year old male with right shoulder pain. EXAM: RIGHT SHOULDER - 2+ VIEW COMPARISON:  None. FINDINGS: The indwelling tunneled hemodialysis catheter overlies the right shoulder on the AP projection. There is no evidence of fracture or dislocation. There is no evidence of arthropathy or other focal bone abnormality. Soft tissues are unremarkable. IMPRESSION: No acute fracture, malalignment, or significant arthropathy. Electronically Signed   By: Ruthann Cancer MD   On: 09/23/2020 12:20               LOS: 1 day   Adonis Yim  [facility]   Pager on [email or web address]. If 7PM-7AM, please contact night-coverage at [email or web address]     09/24/2020, 12:04 PM

## 2020-09-24 NOTE — Progress Notes (Signed)
Subjective  -   Patient seen and examined in dialysis.  He has no complaints.  He is getting dialysis via his tunneled catheter   Physical Exam:  Slight improvement in the appearance of the ulceration over the fistula however there is still some purulent drainage that I was able to express.  There is no surrounding erythema       Assessment/Plan:   \At this point, I feel the patient is going to need exploration in the operating room given the persistent purulent drainage.  I did discuss that this could potentially compromise his access.  Hopefully this is just a superficial process.  He will be n.p.o. after midnight.  COVID test is still pending  Wells Oliwia Berzins 09/24/2020 2:58 PM --  Vitals:   09/24/20 1345 09/24/20 1400  BP: 119/80 (!) 151/88  Pulse: 84 87  Resp: 17 (!) 25  Temp:    SpO2:      Intake/Output Summary (Last 24 hours) at 09/24/2020 1458 Last data filed at 09/24/2020 0300 Gross per 24 hour  Intake 40.08 ml  Output --  Net 40.08 ml     Laboratory CBC    Component Value Date/Time   WBC 8.0 09/24/2020 0433   HGB 9.6 (L) 09/24/2020 0433   HGB 10.9 (L) 02/23/2014 0356   HCT 30.6 (L) 09/24/2020 0433   HCT 34.7 (L) 02/23/2014 0356   PLT 210 09/24/2020 0433   PLT 160 02/23/2014 0356    BMET    Component Value Date/Time   NA 136 09/24/2020 0433   NA 138 02/24/2014 0359   K 4.7 09/24/2020 0433   K 3.5 02/24/2014 0359   CL 98 09/24/2020 0433   CL 97 (L) 02/24/2014 0359   CO2 24 09/24/2020 0433   CO2 31 02/24/2014 0359   GLUCOSE 117 (H) 09/24/2020 0433   GLUCOSE 231 (H) 02/24/2014 0359   BUN 53 (H) 09/24/2020 0433   BUN 38 (H) 02/24/2014 0359   CREATININE 16.42 (H) 09/24/2020 0433   CREATININE 12.04 (H) 02/24/2014 0359   CALCIUM 7.3 (L) 09/24/2020 0433   CALCIUM 7.9 (L) 02/24/2014 0359   GFRNONAA 3 (L) 09/24/2020 0433   GFRNONAA 5 (L) 02/24/2014 0359   GFRAA 3 (L) 05/07/2015 0937   GFRAA 6 (L) 02/24/2014 0359    COAG Lab Results   Component Value Date   INR 1.1 08/09/2020   INR 1.28 06/28/2015   INR 1.04 05/07/2015   No results found for: PTT  Antibiotics Anti-infectives (From admission, onward)   Start     Dose/Rate Route Frequency Ordered Stop   09/24/20 1200  vancomycin (VANCOCIN) IVPB 1000 mg/200 mL premix        1,000 mg 200 mL/hr over 60 Minutes Intravenous Every M-W-F (Hemodialysis) 09/23/20 1446     09/23/20 2200  piperacillin-tazobactam (ZOSYN) IVPB 2.25 g        2.25 g 100 mL/hr over 30 Minutes Intravenous Every 8 hours 09/23/20 1446     09/23/20 1530  vancomycin (VANCOREADY) IVPB 1500 mg/300 mL        1,500 mg 150 mL/hr over 120 Minutes Intravenous  Once 09/23/20 1446 09/23/20 1755   09/23/20 1315  vancomycin (VANCOCIN) IVPB 1000 mg/200 mL premix        1,000 mg 200 mL/hr over 60 Minutes Intravenous  Once 09/23/20 1314 09/23/20 1522   09/23/20 1315  piperacillin-tazobactam (ZOSYN) IVPB 3.375 g        3.375 g 100 mL/hr over 30 Minutes  Intravenous  Once 09/23/20 1314 09/23/20 1415       V. Leia Alf, M.D., [facility] [place] Office: [phone] Pager:  [phone]

## 2020-09-24 NOTE — Progress Notes (Signed)
[facility]  PROGRESS NOTE   Subjective:   Patient seen on dialysis.  Permacath works well. Vascular note appreciated.  Patient is now on IV antibiotics and most likely needs fixation of the abscess.  Objective:  Vital signs in last 24 hours:  Temp:  [98.6 F (37 C)-99.3 F (37.4 C)] 98.7 F (37.1 C) (05/30 0750) Pulse Rate:  [74-85] 83 (05/30 1300) Resp:  [9-20] 12 (05/30 1300) BP: (113-185)/(69-101) 139/92 (05/30 1300) SpO2:  [96 %-100 %] 97 % (05/30 0750)  Weight change:  Filed Weights   09/23/20 1110  Weight: (!) 140 kg    Intake/Output: I/O last 3 completed shifts: In: 90.1 [IV Piggyback:90.1] Out: -    Intake/Output this shift:  No intake/output data recorded.  Physical Exam: General:  No acute distress  Head:  Normocephalic, atraumatic. Moist oral mucosal membranes  Eyes:  Anicteric  Neck:  Supple  Lungs:   Clear to auscultation, normal effort  Heart:  S1S2 no rubs  Abdomen:   Soft, nontender, bowel sounds present  Extremities:  peripheral edema.  Neurologic:  Awake, alert, following commands  Skin:  No lesions  Access:     Basic Metabolic Panel: Recent Labs  Lab 09/23/20 1212 09/24/20 0433  NA 133* 136  K 4.9 4.7  CL 95* 98  CO2 23 24  GLUCOSE 261* 117*  BUN 44* 53*  CREATININE 14.69* 16.42*  CALCIUM 7.3* 7.3*  PHOS  --  7.9*    Liver Function Tests: Recent Labs  Lab 09/23/20 1212 09/24/20 0433  AST 11*  --   ALT 7  --   ALKPHOS 77  --   BILITOT 0.8  --   PROT 8.1  --   ALBUMIN 3.6 3.2*   No results for input(s): LIPASE, AMYLASE in the last 168 hours. No results for input(s): AMMONIA in the last 168 hours.  CBC: Recent Labs  Lab 09/23/20 1212 09/24/20 0433  WBC 8.3 8.0  NEUTROABS 6.4  --   HGB 10.0* 9.6*  HCT 32.4* 30.6*  MCV 89.5 90.0  PLT 225 210    Cardiac Enzymes: No results for input(s): CKTOTAL, CKMB, CKMBINDEX, TROPONINI in the last 168 hours.  BNP: Invalid input(s): POCBNP  CBG: Recent Labs   Lab 09/23/20 1831 09/23/20 2112 09/24/20 0822  GLUCAP 219* 105* 151*    Microbiology: Results for orders placed or performed during the hospital encounter of 09/23/20  Aerobic Culture w Gram Stain (superficial specimen)     Status: None (Preliminary result)   Collection Time: 09/23/20  1:25 PM   Specimen: Hemodialysis Fistula  Result Value Ref Range Status   Specimen Description   Final    HEMODIALYSIS FISTULA Performed at [facility], [address], [place], [place] [address]    Special Requests   Final    NONE Performed at [facility], [address] [place], [place] [address]    Gram Stain   Final    ABUNDANT WBC PRESENT, PREDOMINANTLY PMN RARE GRAM POSITIVE COCCI    Culture   Final    NO GROWTH < 12 HOURS Performed at [facility], [place] [address], [place], [place] [address]    Report Status PENDING  Incomplete  MRSA PCR Screening     Status: None   Collection Time: 09/23/20  9:15 PM   Specimen: Nasopharyngeal  Result Value Ref Range Status   MRSA by PCR NEGATIVE NEGATIVE Final    Comment:  The GeneXpert MRSA Assay (FDA approved for NASAL specimens only), is one component of a comprehensive MRSA colonization surveillance program. It is not intended to diagnose MRSA infection nor to guide or monitor treatment for MRSA infections. Performed at [facility], [address] [place], [place] [address]     Coagulation Studies: No results for input(s): LABPROT, INR in the last 72 hours.  Urinalysis: No results for input(s): COLORURINE, LABSPEC, PHURINE, GLUCOSEU, HGBUR, BILIRUBINUR, KETONESUR, PROTEINUR, UROBILINOGEN, NITRITE, LEUKOCYTESUR in the last 72 hours.  Invalid input(s): APPERANCEUR    Imaging: DG Shoulder Right  Result Date: 09/23/2020 CLINICAL DATA:  45 year old male with right shoulder pain. EXAM: RIGHT SHOULDER - 2+ VIEW COMPARISON:  None. FINDINGS: The indwelling tunneled hemodialysis catheter  overlies the right shoulder on the AP projection. There is no evidence of fracture or dislocation. There is no evidence of arthropathy or other focal bone abnormality. Soft tissues are unremarkable. IMPRESSION: No acute fracture, malalignment, or significant arthropathy. Electronically Signed   By: Ruthann Cancer MD   On: 09/23/2020 12:20     Medications:   . sodium chloride    . sodium chloride    . sodium chloride    . piperacillin-tazobactam (ZOSYN)  IV 2.25 g (09/24/20 0624)  . vancomycin     . aspirin EC  81 mg Oral q1800  . calcium acetate  667 mg Oral TID WC  . carvedilol  12.5 mg Oral BID WC  . Chlorhexidine Gluconate Cloth  6 each Topical Daily  . cinacalcet  60 mg Oral q1800  . heparin  5,000 Units Subcutaneous Q8H  . hydrOXYzine  25 mg Oral QHS  . insulin aspart  0-20 Units Subcutaneous TID WC  . lidocaine-prilocaine   Topical Once  . losartan  50 mg Oral Daily  . pantoprazole  40 mg Oral Daily  . sevelamer carbonate  2.4 g Oral TID WC  . 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  We will attempt to remove 3 L of fluid today.  #2 AV fistula ulceration/infection: Patient was given vancomycin yesterday and is on Zosyn.  Patient was evaluated by vascular.  Plan as per vascular.  #3 congestive heart failure continue to remove fluids as tolerated.  We will continue to monitor closely.   LOS: [place], MD [facility] 5/30/20221:21 PM

## 2020-09-25 ENCOUNTER — Encounter
Admission: EM | Disposition: A | Discharge status: Home / Self Care | Payer: Self-pay | Source: Home / Self Care | Attending: Internal Medicine

## 2020-09-25 ENCOUNTER — Encounter: Payer: Self-pay | Admitting: Internal Medicine

## 2020-09-25 ENCOUNTER — Inpatient Hospital Stay: Payer: Medicare Other | Admitting: Certified Registered Nurse Anesthetist

## 2020-09-25 DIAGNOSIS — T827XXD Infection and inflammatory reaction due to other cardiac and vascular devices, implants and grafts, subsequent encounter: Secondary | ICD-10-CM | POA: Diagnosis not present

## 2020-09-25 DIAGNOSIS — I10 Essential (primary) hypertension: Secondary | ICD-10-CM | POA: Diagnosis not present

## 2020-09-25 DIAGNOSIS — L089 Local infection of the skin and subcutaneous tissue, unspecified: Secondary | ICD-10-CM

## 2020-09-25 DIAGNOSIS — N186 End stage renal disease: Secondary | ICD-10-CM | POA: Diagnosis not present

## 2020-09-25 HISTORY — PX: WOUND DEBRIDEMENT: SHX247

## 2020-09-25 LAB — GLUCOSE, CAPILLARY
Glucose-Capillary: 143 mg/dL — ABNORMAL HIGH (ref 70–99)
Glucose-Capillary: 178 mg/dL — ABNORMAL HIGH (ref 70–99)
Glucose-Capillary: 145 mg/dL — ABNORMAL HIGH (ref 70–99)
Glucose-Capillary: 183 mg/dL — ABNORMAL HIGH (ref 70–99)
Glucose-Capillary: 99 mg/dL (ref 70–99)

## 2020-09-25 LAB — HEMOGLOBIN A1C
Hgb A1c MFr Bld: 9 % — ABNORMAL HIGH (ref 4.8–5.6)
Mean Plasma Glucose: 212 mg/dL

## 2020-09-25 SURGERY — DEBRIDEMENT, WOUND
Anesthesia: General

## 2020-09-25 MED ORDER — PROPOFOL 10 MG/ML IV BOLUS
INTRAVENOUS | Status: DC | PRN
  Administered 2020-09-25: 200 mg via INTRAVENOUS

## 2020-09-25 MED ORDER — FENTANYL CITRATE (PF) 100 MCG/2ML IJ SOLN
INTRAMUSCULAR | Status: AC
  Filled 2020-09-25: qty 2

## 2020-09-25 MED ORDER — ACETAMINOPHEN 10 MG/ML IV SOLN
INTRAVENOUS | Status: AC
  Filled 2020-09-25: qty 100

## 2020-09-25 MED ORDER — BUPIVACAINE HCL (PF) 0.5 % IJ SOLN
INTRAMUSCULAR | Status: AC
  Filled 2020-09-25: qty 30

## 2020-09-25 MED ORDER — LIDOCAINE HCL (CARDIAC) PF 100 MG/5ML IV SOSY
PREFILLED_SYRINGE | INTRAVENOUS | Status: DC | PRN
  Administered 2020-09-25: 80 mg via INTRAVENOUS

## 2020-09-25 MED ORDER — BACITRACIN ZINC 500 UNIT/GM EX OINT
TOPICAL_OINTMENT | CUTANEOUS | Status: AC
  Filled 2020-09-25: qty 28.35

## 2020-09-25 MED ORDER — OXYCODONE HCL 5 MG PO TABS: 5.0000 mg | ORAL_TABLET | Freq: Once | ORAL | Status: DC | PRN

## 2020-09-25 MED ORDER — OXYCODONE HCL 5 MG/5ML PO SOLN: 5.0000 mg | Freq: Once | ORAL | Status: DC | PRN

## 2020-09-25 MED ORDER — MIDAZOLAM HCL 2 MG/2ML IJ SOLN
INTRAMUSCULAR | Status: AC
  Filled 2020-09-25: qty 2

## 2020-09-25 MED ORDER — SUGAMMADEX SODIUM 500 MG/5ML IV SOLN
INTRAVENOUS | Status: AC
  Filled 2020-09-25: qty 5

## 2020-09-25 MED ORDER — FENTANYL CITRATE (PF) 100 MCG/2ML IJ SOLN
INTRAMUSCULAR | Status: DC | PRN
  Administered 2020-09-25 (×2): 50 ug via INTRAVENOUS

## 2020-09-25 MED ORDER — MEPERIDINE HCL 25 MG/ML IJ SOLN: 6.2500 mg | INTRAMUSCULAR | Status: DC | PRN

## 2020-09-25 MED ORDER — PHENYLEPHRINE HCL (PRESSORS) 10 MG/ML IV SOLN
INTRAVENOUS | Status: DC | PRN
  Administered 2020-09-25 (×4): 100 ug via INTRAVENOUS

## 2020-09-25 MED ORDER — PROPOFOL 10 MG/ML IV BOLUS
INTRAVENOUS | Status: AC
  Filled 2020-09-25: qty 20

## 2020-09-25 MED ORDER — ONDANSETRON HCL 4 MG/2ML IJ SOLN
INTRAMUSCULAR | Status: DC | PRN
  Administered 2020-09-25: 4 mg via INTRAVENOUS

## 2020-09-25 MED ORDER — SUGAMMADEX SODIUM 500 MG/5ML IV SOLN
INTRAVENOUS | Status: DC | PRN
  Administered 2020-09-25: 200 mg via INTRAVENOUS

## 2020-09-25 MED ORDER — PROMETHAZINE HCL 25 MG/ML IJ SOLN: 6.2500 mg | INTRAMUSCULAR | Status: DC | PRN

## 2020-09-25 MED ORDER — ROCURONIUM BROMIDE 100 MG/10ML IV SOLN
INTRAVENOUS | Status: DC | PRN
  Administered 2020-09-25: 60 mg via INTRAVENOUS

## 2020-09-25 MED ORDER — FENTANYL CITRATE (PF) 100 MCG/2ML IJ SOLN: 25.0000 ug | INTRAMUSCULAR | Status: DC | PRN

## 2020-09-25 SURGICAL SUPPLY — 22 items
BNDG COHESIVE 6X5 TAN STRL LF (GAUZE/BANDAGES/DRESSINGS) ×2 IMPLANT
CANISTER SUCT 1200ML W/VALVE (MISCELLANEOUS) ×2 IMPLANT
COVER WAND RF STERILE (DRAPES) ×2 IMPLANT
DRSG EMULSION OIL 3X3 NADH (GAUZE/BANDAGES/DRESSINGS) ×2 IMPLANT
ELECT REM PT RETURN 9FT ADLT (ELECTROSURGICAL) ×2
ELECTRODE REM PT RTRN 9FT ADLT (ELECTROSURGICAL) ×1 IMPLANT
GAUZE SPONGE 4X4 12PLY STRL (GAUZE/BANDAGES/DRESSINGS) ×2 IMPLANT
GLOVE SURG ENC MOIS LTX SZ7 (GLOVE) ×2 IMPLANT
GLOVE SURG SYN 8.0 (GLOVE) ×2 IMPLANT
GLOVE SURG UNDER LTX SZ7.5 (GLOVE) ×2 IMPLANT
GOWN STRL REUS W/ TWL XL LVL3 (GOWN DISPOSABLE) ×2 IMPLANT
GOWN STRL REUS W/TWL XL LVL3 (GOWN DISPOSABLE) ×2
KIT TURNOVER KIT A (KITS) ×2 IMPLANT
MANIFOLD NEPTUNE II (INSTRUMENTS) ×2 IMPLANT
NS IRRIG 500ML POUR BTL (IV SOLUTION) ×2 IMPLANT
PACK EXTREMITY ARMC (MISCELLANEOUS) ×2 IMPLANT
PAD PREP 24X41 OB/GYN DISP (PERSONAL CARE ITEMS) ×2 IMPLANT
SLING ARM LRG DEEP (SOFTGOODS) ×2 IMPLANT
SOL PREP PVP 2OZ (MISCELLANEOUS) ×2
SOLUTION PREP PVP 2OZ (MISCELLANEOUS) ×1 IMPLANT
STOCKINETTE IMPERV 14X48 (MISCELLANEOUS) ×2 IMPLANT
SUT ETHILON NAB PS2 4-0 18IN (SUTURE) ×4 IMPLANT

## 2020-09-25 NOTE — Progress Notes (Addendum)
[facility]  PROGRESS NOTE   Subjective:   Patient seen sitting up in bed Alert and oriented States procedure went well on left arm HD access Says he received dialysis via Rt chest permcath Meal tray arrived at end of visit. Denies nausea but feels constipated Denies shortness of breath  Objective:  Vital signs in last 24 hours:  Temp:  [98.6 F (37 C)-99.3 F (37.4 C)] 98.7 F (37.1 C) (05/30 0750) Pulse Rate:  [74-85] 83 (05/30 1300) Resp:  [9-20] 12 (05/30 1300) BP: (113-185)/(69-101) 139/92 (05/30 1300) SpO2:  [96 %-100 %] 97 % (05/30 0750)  Weight change:  Filed Weights   09/23/20 1110  Weight: (!) 140 kg    Intake/Output: I/O last 3 completed shifts: In: 90.1 [IV Piggyback:90.1] Out: -    Intake/Output this shift:  No intake/output data recorded.  Physical Exam: General:  No acute distress, flat affect  Head:  Normocephalic, atraumatic. Moist oral mucosal membranes  Eyes:  Anicteric  Lungs:   Clear to auscultation, normal effort  Heart:  S1S2 no rubs  Abdomen:   Soft, nontender, bowel sounds present  Extremities:  trace peripheral edema.  Neurologic:  Awake, alert, following commands  Skin:  No lesions  Access: Rt IJ Permcath, Lt AVF    Basic Metabolic Panel: Recent Labs  Lab 09/23/20 1212 09/24/20 0433  NA 133* 136  K 4.9 4.7  CL 95* 98  CO2 23 24  GLUCOSE 261* 117*  BUN 44* 53*  CREATININE 14.69* 16.42*  CALCIUM 7.3* 7.3*  PHOS  --  7.9*    Liver Function Tests: Recent Labs  Lab 09/23/20 1212 09/24/20 0433  AST 11*  --   ALT 7  --   ALKPHOS 77  --   BILITOT 0.8  --   PROT 8.1  --   ALBUMIN 3.6 3.2*   No results for input(s): LIPASE, AMYLASE in the last 168 hours. No results for input(s): AMMONIA in the last 168 hours.  CBC: Recent Labs  Lab 09/23/20 1212 09/24/20 0433  WBC 8.3 8.0  NEUTROABS 6.4  --   HGB 10.0* 9.6*  HCT 32.4* 30.6*  MCV 89.5 90.0  PLT 225 210    Cardiac Enzymes: No results for  input(s): CKTOTAL, CKMB, CKMBINDEX, TROPONINI in the last 168 hours.  BNP: Invalid input(s): POCBNP  CBG: Recent Labs  Lab 09/24/20 1303 09/24/20 1616 09/24/20 2117 09/25/20 0825 09/25/20 1121  GLUCAP 143* 145* 147* 178* 145*    Microbiology: Results for orders placed or performed during the hospital encounter of 09/23/20  Aerobic Culture w Gram Stain (superficial specimen)     Status: None (Preliminary result)   Collection Time: 09/23/20  1:25 PM   Specimen: Hemodialysis Fistula  Result Value Ref Range Status   Specimen Description   Final    HEMODIALYSIS FISTULA Performed at [facility], [address] [place], [place] [address]    Special Requests   Final    NONE Performed at [facility], [address] [place], [place] [address]    Gram Stain   Final    ABUNDANT WBC PRESENT, PREDOMINANTLY PMN RARE GRAM POSITIVE COCCI    Culture   Final    NO GROWTH < 12 HOURS Performed at [facility], [place] [address], [place], [place] [address]    Report Status PENDING  Incomplete  MRSA PCR Screening     Status: None   Collection Time: 09/23/20  9:15 PM   Specimen:  Nasopharyngeal  Result Value Ref Range Status   MRSA by PCR NEGATIVE NEGATIVE Final    Comment:        The GeneXpert MRSA Assay (FDA approved for NASAL specimens only), is one component of a comprehensive MRSA colonization surveillance program. It is not intended to diagnose MRSA infection nor to guide or monitor treatment for MRSA infections. Performed at [facility], [address] [place], [place] [address]     Coagulation Studies: No results for input(s): LABPROT, INR in the last 72 hours.  Urinalysis: No results for input(s): COLORURINE, LABSPEC, PHURINE, GLUCOSEU, HGBUR, BILIRUBINUR, KETONESUR, PROTEINUR, UROBILINOGEN, NITRITE, LEUKOCYTESUR in the last 72 hours.  Invalid input(s): APPERANCEUR    Imaging: DG Shoulder Right  Result Date:  09/23/2020 CLINICAL DATA:  45 year old male with right shoulder pain. EXAM: RIGHT SHOULDER - 2+ VIEW COMPARISON:  None. FINDINGS: The indwelling tunneled hemodialysis catheter overlies the right shoulder on the AP projection. There is no evidence of fracture or dislocation. There is no evidence of arthropathy or other focal bone abnormality. Soft tissues are unremarkable. IMPRESSION: No acute fracture, malalignment, or significant arthropathy. Electronically Signed   By: Ruthann Cancer MD   On: 09/23/2020 12:20     Medications:   . sodium chloride    . sodium chloride    . sodium chloride    . piperacillin-tazobactam (ZOSYN)  IV 2.25 g (09/24/20 0624)  . vancomycin     . aspirin EC  81 mg Oral q1800  . calcium acetate  667 mg Oral TID WC  . carvedilol  12.5 mg Oral BID WC  . Chlorhexidine Gluconate Cloth  6 each Topical Daily  . cinacalcet  60 mg Oral q1800  . heparin  5,000 Units Subcutaneous Q8H  . hydrOXYzine  25 mg Oral QHS  . insulin aspart  0-20 Units Subcutaneous TID WC  . lidocaine-prilocaine   Topical Once  . losartan  50 mg Oral Daily  . pantoprazole  40 mg Oral Daily  . sevelamer carbonate  2.4 g Oral TID WC  . 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   Will schedule for dialysis tomorrow UF goal 2L Will perform HD with Permcath  #2 AV fistula ulceration/infection: Patient was given vancomycin yesterday and is on Zosyn.  Appreciate Vascular surgery recs. Lt AVF debridement of access on 09/25/20. Awaiting guidance on when HD can be performed via this access.  Will continue to use Permcath  #3 congestive heart failure  Will monitor fluid and  remove with HD    LOS: 2 Colon Flattery, MD [facility] 5/31/20222:01 PM

## 2020-09-25 NOTE — Transfer of Care (Signed)
Immediate Anesthesia Transfer of Care Note  Patient: [name] [name]  Procedure(s) Performed: DEBRIDEMENT WOUND (N/A )  Patient Location: PACU  Anesthesia Type:General  Level of Consciousness: awake and alert   Airway & Oxygen Therapy: Patient Spontanous Breathing and Patient connected to face mask oxygen  Post-op Assessment: Report given to RN and Post -op Vital signs reviewed and stable  Post vital signs: Reviewed and stable  Last Vitals:  Vitals Value Taken Time  BP 144/79 09/25/20 1115  Temp    Pulse 81 09/25/20 1117  Resp 44 09/25/20 1117  SpO2 100 % 09/25/20 1117  Vitals shown include unvalidated device data.  Last Pain:  Vitals:   09/25/20 0902  TempSrc: Temporal  PainSc: 0-No pain         Complications: No complications documented.

## 2020-09-25 NOTE — Anesthesia Preprocedure Evaluation (Signed)
Anesthesia Evaluation  Patient identified by MRN, date of birth, ID band Patient awake    Reviewed: Allergy & Precautions, NPO status , Patient's Chart, lab work & pertinent test results  History of Anesthesia Complications Negative for: history of anesthetic complications  Airway Mallampati: II  TM Distance: >3 FB Neck ROM: Full    Dental  (+) Poor Dentition, Missing   Pulmonary neg pulmonary ROS, neg sleep apnea, neg COPD,    breath sounds clear to auscultation- rhonchi (-) wheezing      Cardiovascular hypertension, Pt. on medications + CAD and +CHF  (-) Past MI, (-) Cardiac Stents and (-) CABG  Rhythm:Regular Rate:Normal - Systolic murmurs and - Diastolic murmurs    Neuro/Psych neg Seizures negative neurological ROS  negative psych ROS   GI/Hepatic Neg liver ROS, GERD  ,  Endo/Other  diabetes, Insulin Dependent  Renal/GU ESRF and DialysisRenal disease     Musculoskeletal negative musculoskeletal ROS (+)   Abdominal (+) + obese,   Peds  Hematology negative hematology ROS (+)   Anesthesia Other Findings Past Medical History: No date: CHF (congestive heart failure) (HCC) No date: Coronary artery disease No date: Diabetes mellitus without complication (HCC) No date: Dialysis patient ([place])     Comment:  M-W-F No date: GERD (gastroesophageal reflux disease) No date: Hypertension 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 No date: Renal disorder     Comment:  on dialysis for 4 years three times a week last was               Tuesday No date: Secondary hyperparathyroidism of renal origin ([place]) No date: Shortness of breath dyspnea No date: Type 2 diabetes mellitus with diabetic nephropathy (HCC) No date: Type 2 diabetes mellitus with diabetic retinopathy (HCC)   Reproductive/Obstetrics                             Anesthesia Physical Anesthesia Plan  ASA: IV  Anesthesia Plan: General   Post-op Pain  Management:    Induction: Intravenous  PONV Risk Score and Plan: 1 and Ondansetron  Airway Management Planned: Oral ETT  Additional Equipment:   Intra-op Plan:   Post-operative Plan: Extubation in OR  Informed Consent: I have reviewed the patients History and Physical, chart, labs and discussed the procedure including the risks, benefits and alternatives for the proposed anesthesia with the patient or authorized representative who has indicated his/her understanding and acceptance.     Dental advisory given  Plan Discussed with: CRNA and Anesthesiologist  Anesthesia Plan Comments:         Anesthesia Quick Evaluation

## 2020-09-25 NOTE — Progress Notes (Signed)
Transferred to OR via stretcher by transport tech

## 2020-09-25 NOTE — Op Note (Signed)
    OPERATIVE NOTE   PROCEDURE: Excision with debridement of superficial cutaneous ulcer left arm  PRE-OPERATIVE DIAGNOSIS: Ulcer overlying left arm fistula  POST-OPERATIVE DIAGNOSIS: Same  SURGEON: Hortencia Pilar  ASSISTANT(S): None  ANESTHESIA: general  ESTIMATED BLOOD LOSS: Less than 5 cc  FINDING(S): Superficial ulcers subcutaneous fatty tissues appeared healthy no further purulence identified  SPECIMEN(S): Debrided skin and subcutaneous tissues were not sent to pathology  INDICATIONS:   [name] [name] is a 45 y.o. male who presents with a draining ulcer overlying his fistula.  He was admitted and subsequently started on antibiotic therapy.  Today the ulcerated area is nondraining there does not appear to be any surrounding induration or erythema.  I will plan on debridement evaluation to see if there is a deeper track and primary closure if it is superficial..  DESCRIPTION: After full informed written consent was obtained from the patient, the patient was brought back to the operating room and placed supine upon the operating table.  Prior to induction, the patient received IV antibiotics.   After obtaining adequate anesthesia, the patient was then prepped and draped in the standard fashion for a debridement of the arm ulcer.  Half percent Marcaine was infiltrated into the soft tissue surrounding the ulcerated area.  Using a 15 blade scalpel an elliptical incision that measured 10 mm in length by 6 mm in width was then created around the ulcerated area.  The ulcer was then in excised en bloc with a 15 blade scalpel.  Bleeding from the skin edge was controlled with Bovie cautery.  Light pressure was held.  Exploration revealed this process to be completely superficial.  There was no tracking.  There is no exposure of the cephalic vein.  There was no pocket of purulence.  All tissues appeared healthy.  Two 4-0 nylon interrupted vertical mattress sutures were then used to  reapproximate the wound antibiotic ointment was applied and a gauze dressing used to cover the wound.   The patient tolerated this procedure well.   COMPLICATIONS: None  CONDITION: [name]  Office: [phone]   09/25/2020, 11:12 AM

## 2020-09-25 NOTE — Progress Notes (Signed)
Progress Note    [name] [name]  [id number] DOB: 1976/02/24  DOA: 09/23/2020 PCP: Danelle Berry, NP      Brief Narrative:    Medical records reviewed and are as summarized below:  [name] [name] is a 45 y.o. male with medical history significant for diabetes mellitus with complications of end stage renal disease on hemodialysis, dialysis days are Monday/Wednesday/Friday, hypertension, Kyrle's disease, coronary artery disease and CHF, who presented to the hospital for evaluation of purulent drainage from around his left AV fistula. Patient initially had issues with his left AV fistula and so had a PermCath placed in April for dialysis.  His AV fistula has been used successfully and the plan was for the PermCath to be removed on 09/27/20.  He said he noticed a palpable at the AV fistula site and also noticed drainage of purulent bloody discharge from an ulcer at the AV fistula site.  He was admitted to the hospital for left AV fistula ulcer/infection.  He was started on empiric IV antibiotics.  Vascular surgery was consulted to assist with management.   Assessment/Plan:   Principal Problem:   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HCC) Active Problems:   End stage renal disease (HCC)   Diabetes mellitus with ESRD (end-stage renal disease) (HCC)   Hypertension   Coronary artery disease   Obesity, Class III, BMI 40-49.9 (morbid obesity) ([place])   Body mass index is 40.72 kg/m.  (Morbid obesity)    Left AV fistula ulcer/infection: Continue empiric IV antibiotics.  Plan for excision of ulcer today.  Follow-up with vascular surgeon.  ESRD: He had hemodialysis yesterday.  Follow-up with nephrologist for dialysis.  Chronic diastolic CHF/hypertension: Continue antihypertensives  Type II DM: NovoLog as needed for hyperglycemia.  CAD: Continue aspirin  Right shoulder pain: No history of fall or injury to the shoulder.  Analgesics as needed.  No acute abnormality on right shoulder  x-ray  Diet Order            Diet NPO time specified Except for: Sips with Meds  Diet effective midnight                    Consultants:  Nephrologist  Vascular surgeon  Procedures:  None    Medications:   . aspirin EC  81 mg Oral q1800  . calcium acetate  667 mg Oral TID WC  . carvedilol  12.5 mg Oral BID WC  . Chlorhexidine Gluconate Cloth  6 each Topical Daily  . cinacalcet  60 mg Oral q1800  . heparin  5,000 Units Subcutaneous Q8H  . hydrOXYzine  25 mg Oral QHS  . insulin aspart  0-20 Units Subcutaneous TID WC  . lidocaine-prilocaine   Topical Once  . losartan  50 mg Oral Daily  . pantoprazole  40 mg Oral Daily  . sevelamer carbonate  2.4 g Oral TID WC  . sodium chloride flush  3 mL Intravenous Q12H   Continuous Infusions: . sodium chloride 250 mL (09/24/20 1439)  . piperacillin-tazobactam (ZOSYN)  IV 2.25 g (09/25/20 0603)  . vancomycin 1,000 mg (09/24/20 1442)     Anti-infectives (From admission, onward)   Start     Dose/Rate Route Frequency Ordered Stop   09/24/20 1200  vancomycin (VANCOCIN) IVPB 1000 mg/200 mL premix        1,000 mg 200 mL/hr over 60 Minutes Intravenous Every M-W-F (Hemodialysis) 09/23/20 1446     09/23/20 2200  piperacillin-tazobactam (ZOSYN) IVPB 2.25 g  2.25 g 100 mL/hr over 30 Minutes Intravenous Every 8 hours 09/23/20 1446     09/23/20 1530  vancomycin (VANCOREADY) IVPB 1500 mg/300 mL        1,500 mg 150 mL/hr over 120 Minutes Intravenous  Once 09/23/20 1446 09/23/20 1755   09/23/20 1315  vancomycin (VANCOCIN) IVPB 1000 mg/200 mL premix        1,000 mg 200 mL/hr over 60 Minutes Intravenous  Once 09/23/20 1314 09/23/20 1522   09/23/20 1315  piperacillin-tazobactam (ZOSYN) IVPB 3.375 g        3.375 g 100 mL/hr over 30 Minutes Intravenous  Once 09/23/20 1314 09/23/20 1415             Family Communication/Anticipated D/C date and plan/Code Status   DVT prophylaxis: heparin injection 5,000 Units Start:  09/23/20 2200     Code Status: Full Code  Family Communication: None Disposition Plan:    Status is: Inpatient  Remains inpatient appropriate because:Inpatient level of care appropriate due to severity of illness   Dispo: The patient is from: Home              Anticipated d/c is to: Home              Patient currently is not medically stable to d/c.   Difficult to place patient No           Subjective:   Interval events noted.  No new complaints.  Objective:    Vitals:   09/24/20 2001 09/24/20 2336 09/25/20 0351 09/25/20 0823  BP: 117/79 126/81 136/89 (!) 162/100  Pulse: 89 84 79 80  Resp: '16 16 16 18  '$ Temp: 99 F (37.2 C) 98.3 F (36.8 C) 98.4 F (36.9 C) 98 F (36.7 C)  TempSrc: Oral Oral Oral Oral  SpO2: 100% 98% 97% 99%  Weight:      Height:       No data found.   Intake/Output Summary (Last 24 hours) at 09/25/2020 0828 Last data filed at 09/24/2020 1500 Gross per 24 hour  Intake 57.18 ml  Output 2500 ml  Net -2442.82 ml   Filed Weights   09/23/20 1110  Weight: (!) 140 kg    Exam:   GEN: NAD SKIN: Warm and dry.  Multiple hyperpigmented patchy macular lesions on the skin.  Permacath on right upper chest wall. EYES: No pallor or icterus ENT: MMM CV: RRR PULM: CTA B ABD: soft, ND, NT, +BS CNS: AAO x 3, non focal EXT: Left arm AV fistula with palpable thrill and audible bruit.  Small ulcer noted in the left antecubital area at AV fistula site. MSK: Mild right shoulder tenderness.  No erythema or obvious swelling noted on the right shoulder.  He is able to move the shoulder joint although is slightly limited because of pain.          Data Reviewed:   I have personally reviewed following labs and imaging studies:  Labs: Labs show the following:   Basic Metabolic Panel: Recent Labs  Lab 09/23/20 1212 09/24/20 0433  NA 133* 136  K 4.9 4.7  CL 95* 98  CO2 23 24  GLUCOSE 261* 117*  BUN 44* 53*  CREATININE 14.69* 16.42*   CALCIUM 7.3* 7.3*  PHOS  --  7.9*   GFR Estimated Creatinine Clearance: 8.3 mL/min (A) (by C-G formula based on SCr of 16.42 mg/dL (H)). Liver Function Tests: Recent Labs  Lab 09/23/20 1212 09/24/20 0433  AST 11*  --  ALT 7  --   ALKPHOS 77  --   BILITOT 0.8  --   PROT 8.1  --   ALBUMIN 3.6 3.2*   No results for input(s): LIPASE, AMYLASE in the last 168 hours. No results for input(s): AMMONIA in the last 168 hours. Coagulation profile No results for input(s): INR, PROTIME in the last 168 hours.  CBC: Recent Labs  Lab 09/23/20 1212 09/24/20 0433  WBC 8.3 8.0  NEUTROABS 6.4  --   HGB 10.0* 9.6*  HCT 32.4* 30.6*  MCV 89.5 90.0  PLT 225 210   Cardiac Enzymes: No results for input(s): CKTOTAL, CKMB, CKMBINDEX, TROPONINI in the last 168 hours. BNP (last 3 results) No results for input(s): PROBNP in the last 8760 hours. CBG: Recent Labs  Lab 09/23/20 1831 09/23/20 2112 09/24/20 0822 09/24/20 1616 09/24/20 2117  GLUCAP 219* 105* 151* 145* 147*   D-Dimer: No results for input(s): DDIMER in the last 72 hours. Hgb A1c: No results for input(s): HGBA1C in the last 72 hours. Lipid Profile: No results for input(s): CHOL, HDL, LDLCALC, TRIG, CHOLHDL, LDLDIRECT in the last 72 hours. Thyroid function studies: No results for input(s): TSH, T4TOTAL, T3FREE, THYROIDAB in the last 72 hours.  Invalid input(s): FREET3 Anemia work up: No results for input(s): VITAMINB12, FOLATE, FERRITIN, TIBC, IRON, RETICCTPCT in the last 72 hours. Sepsis Labs: Recent Labs  Lab 09/23/20 1212 09/23/20 1904 09/24/20 0433  WBC 8.3  --  8.0  LATICACIDVEN 1.0 0.8  --     Microbiology Recent Results (from the past 240 hour(s))  Aerobic Culture w Gram Stain (superficial specimen)     Status: None (Preliminary result)   Collection Time: 09/23/20  1:25 PM   Specimen: Hemodialysis Fistula  Result Value Ref Range Status   Specimen Description   Final    HEMODIALYSIS FISTULA Performed at  [facility], [address] [place], [place] [address]    Special Requests   Final    NONE Performed at [facility], [address] [place], [place] [address]    Gram Stain   Final    ABUNDANT WBC PRESENT, PREDOMINANTLY PMN RARE GRAM POSITIVE COCCI    Culture   Final    NO GROWTH < 12 HOURS Performed at [facility], [place] [address], [place], [place] [address]    Report Status PENDING  Incomplete  SARS CORONAVIRUS 2 (TAT 6-24 HRS) Nasopharyngeal Nasopharyngeal Swab     Status: None   Collection Time: 09/23/20  1:30 PM   Specimen: Nasopharyngeal Swab  Result Value Ref Range Status   SARS Coronavirus 2 NEGATIVE NEGATIVE Final    Comment: (NOTE) SARS-CoV-2 target nucleic acids are NOT DETECTED.  The SARS-CoV-2 RNA is generally detectable in upper and lower respiratory specimens during the acute phase of infection. Negative results do not preclude SARS-CoV-2 infection, do not rule out co-infections with other pathogens, and should not be used as the sole basis for treatment or other patient management decisions. Negative results must be combined with clinical observations, patient history, and epidemiological information. The expected result is Negative.  Fact Sheet for Patients: SugarRoll.be  Fact Sheet for Healthcare Providers: https://www.woods-mathews.com/  This test is not yet approved or cleared by the [place] FDA and  has been authorized for detection and/or diagnosis of SARS-CoV-2 by FDA under an Emergency Use Authorization (EUA). This EUA will remain  in effect (meaning this test can be used) for the duration of the COVID-19 declaration under Se  ction 564(b)(1) of the Act, 21 U.S.C. section 360bbb-3(b)(1), unless the authorization is terminated or revoked sooner.  Performed at [facility], [place] [address] [place], [place] [address]   MRSA PCR Screening     Status: None   Collection  Time: 09/23/20  9:15 PM   Specimen: Nasopharyngeal  Result Value Ref Range Status   MRSA by PCR NEGATIVE NEGATIVE Final    Comment:        The GeneXpert MRSA Assay (FDA approved for NASAL specimens only), is one component of a comprehensive MRSA colonization surveillance program. It is not intended to diagnose MRSA infection nor to guide or monitor treatment for MRSA infections. Performed at [facility] [address] [place], [place] [address]     Procedures and diagnostic studies:  DG Shoulder Right  Result Date: 09/23/2020 CLINICAL DATA:  45 year old male with right shoulder pain. EXAM: RIGHT SHOULDER - 2+ VIEW COMPARISON:  None. FINDINGS: The indwelling tunneled hemodialysis catheter overlies the right shoulder on the AP projection. There is no evidence of fracture or dislocation. There is no evidence of arthropathy or other focal bone abnormality. Soft tissues are unremarkable. IMPRESSION: No acute fracture, malalignment, or significant arthropathy. Electronically Signed   By: Ruthann Cancer MD   On: 09/23/2020 12:20               LOS: 2 days   Makinze Jani  [facility]   Pager on [email or web address]. If 7PM-7AM, please contact night-coverage at [email or web address]     09/25/2020, 8:28 AM

## 2020-09-25 NOTE — Anesthesia Postprocedure Evaluation (Signed)
Anesthesia Post Note  Patient: [name] [name]  Procedure(s) Performed: DEBRIDEMENT WOUND (N/A )  Patient location during evaluation: PACU Anesthesia Type: General Level of consciousness: awake and alert and oriented Pain management: pain level controlled Vital Signs Assessment: post-procedure vital signs reviewed and stable Respiratory status: spontaneous breathing, nonlabored ventilation and respiratory function stable Cardiovascular status: blood pressure returned to baseline and stable Postop Assessment: no signs of nausea or vomiting Anesthetic complications: no   No complications documented.   Last Vitals:  Vitals:   09/25/20 1142 09/25/20 1145  BP:  (!) 158/89  Pulse: 83 81  Resp: 10 12  Temp: (!) 36.4 C   SpO2: 100% 99%    Last Pain:  Vitals:   09/25/20 1142  TempSrc:   PainSc: 0-No pain                 Magdalena Skilton

## 2020-09-25 NOTE — Anesthesia Procedure Notes (Signed)
Procedure Name: Intubation Date/Time: 09/25/2020 10:38 AM Performed by: Lily Peer, Shann Merrick, CRNA Pre-anesthesia Checklist: Patient identified, Emergency Drugs available, Suction available and Patient being monitored Patient Re-evaluated:Patient Re-evaluated prior to induction Oxygen Delivery Method: Circle system utilized Preoxygenation: Pre-oxygenation with 100% oxygen Induction Type: IV induction Ventilation: Mask ventilation without difficulty Laryngoscope Size: McGraph and 4 Grade View: Grade I Tube type: Oral Tube size: 7.5 mm Number of attempts: 1 Airway Equipment and Method: Stylet Placement Confirmation: ETT inserted through vocal cords under direct vision,  positive ETCO2 and breath sounds checked- equal and bilateral Secured at: 22 cm Tube secured with: Tape Dental Injury: Teeth and Oropharynx as per pre-operative assessment

## 2020-09-26 ENCOUNTER — Other Ambulatory Visit (INDEPENDENT_AMBULATORY_CARE_PROVIDER_SITE_OTHER): Payer: Self-pay | Admitting: Vascular Surgery

## 2020-09-26 DIAGNOSIS — N186 End stage renal disease: Secondary | ICD-10-CM | POA: Diagnosis not present

## 2020-09-26 DIAGNOSIS — T827XXD Infection and inflammatory reaction due to other cardiac and vascular devices, implants and grafts, subsequent encounter: Secondary | ICD-10-CM | POA: Diagnosis not present

## 2020-09-26 LAB — HEPATITIS B SURFACE ANTIGEN: Hepatitis B Surface Ag: NONREACTIVE

## 2020-09-26 LAB — HEPATITIS B E ANTIGEN: Hep B E Ag: NEGATIVE

## 2020-09-26 LAB — GLUCOSE, CAPILLARY
Glucose-Capillary: 178 mg/dL — ABNORMAL HIGH (ref 70–99)
Glucose-Capillary: 122 mg/dL — ABNORMAL HIGH (ref 70–99)
Glucose-Capillary: 176 mg/dL — ABNORMAL HIGH (ref 70–99)
Glucose-Capillary: 158 mg/dL — ABNORMAL HIGH (ref 70–99)

## 2020-09-26 LAB — BASIC METABOLIC PANEL
Anion gap: 16 — ABNORMAL HIGH (ref 5–15)
BUN: 42 mg/dL — ABNORMAL HIGH (ref 6–20)
CO2: 23 mmol/L (ref 22–32)
Calcium: 7.5 mg/dL — ABNORMAL LOW (ref 8.9–10.3)
Chloride: 98 mmol/L (ref 98–111)
Creatinine, Ser: 14.9 mg/dL — ABNORMAL HIGH (ref 0.61–1.24)
GFR, Estimated: 4 mL/min — ABNORMAL LOW (ref >60)
Glucose, Bld: 165 mg/dL — ABNORMAL HIGH (ref 70–99)
Potassium: 4.7 mmol/L (ref 3.5–5.1)
Sodium: 137 mmol/L (ref 135–145)

## 2020-09-26 LAB — AEROBIC CULTURE W GRAM STAIN (SUPERFICIAL SPECIMEN): Culture: NORMAL

## 2020-09-26 NOTE — H&P (View-Only) (Signed)
 Vein & Vascular Surgery Daily Progress Note  Subjective: 09/25/20: Excision with debridement of superficial cutaneous ulcer left arm  Patient without complaint.  No acute issues overnight.  Objective: Vitals:   09/26/20 1002 09/26/20 1020 09/26/20 1157 09/26/20 1523  BP: (!) 160/74 (!) 188/84 (!) 146/72 138/83  Pulse: 84 90 79 93  Resp: '16 16 20 15  '$ Temp: 98.4 F (36.9 C)  98.6 F (37 C) 98.6 F (37 C)  TempSrc:   Oral Oral  SpO2: 100% 100% 99% 100%  Weight:      Height:        Intake/Output Summary (Last 24 hours) at 09/26/2020 1548 Last data filed at 09/26/2020 1009 Gross per 24 hour  Intake 120 ml  Output --  Net 120 ml   Physical Exam: A&Ox3, NAD CV: RRR Pulmonary: CTA Bilaterally Abdomen: Soft, Nontender, Nondistended Vascular:  Left upper extremity: OR dressing clean dry and intact.  Extremity is soft. Good bruit and thrill.   Laboratory: CBC    Component Value Date/Time   WBC 8.0 09/24/2020 0433   HGB 9.6 (L) 09/24/2020 0433   HGB 10.9 (L) 02/23/2014 0356   HCT 30.6 (L) 09/24/2020 0433   HCT 34.7 (L) 02/23/2014 0356   PLT 210 09/24/2020 0433   PLT 160 02/23/2014 0356   BMET    Component Value Date/Time   NA 137 09/26/2020 0602   NA 138 02/24/2014 0359   K 4.7 09/26/2020 0602   K 3.5 02/24/2014 0359   CL 98 09/26/2020 0602   CL 97 (L) 02/24/2014 0359   CO2 23 09/26/2020 0602   CO2 31 02/24/2014 0359   GLUCOSE 165 (H) 09/26/2020 0602   GLUCOSE 231 (H) 02/24/2014 0359   BUN 42 (H) 09/26/2020 0602   BUN 38 (H) 02/24/2014 0359   CREATININE 14.90 (H) 09/26/2020 0602   CREATININE 12.04 (H) 02/24/2014 0359   CALCIUM 7.5 (L) 09/26/2020 0602   CALCIUM 7.9 (L) 02/24/2014 0359   GFRNONAA 4 (L) 09/26/2020 0602   GFRNONAA 5 (L) 02/24/2014 0359   GFRAA 3 (L) 05/07/2015 0937   GFRAA 6 (L) 02/24/2014 0359   Assessment/Planning: [name] [name] is a 45 year old male who presents with a draining ulcer overlying his fistula s/p debridement -  POD#1  1) nephrology is requesting a PermCath exchange due to the venous port not functioning appropriately.  Procedure, risks and benefits were explained to the patient.  All questions were answered.  The patient wishes to proceed. We will plan on this tomorrow.  2) doing well status post surgery  Discussed with Dr. Ellis [facility] PA-C 09/26/2020 3:48 PM

## 2020-09-26 NOTE — Progress Notes (Signed)
PROGRESS NOTE    [name] [name]  [id number] DOB: 10-May-1975 DOA: 09/23/2020 PCP: Danelle Berry, NP   Assessment & Plan:   Principal Problem:   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[place]) Active Problems:   End stage renal disease ([place])   Diabetes mellitus with ESRD (end-stage renal disease) ([place])   Hypertension   Coronary artery disease   Obesity, Class III, BMI 40-49.9 (morbid obesity) ([place])    Left AV fistula ulcer/infection: continue on IV zosyn, vanco. S/p excision w/ debridement of superficial cutaneous ulcer left arm. HD fistula cx is pending still   ESRD: continue on HD as per nephro. Nephro following and recs apprec   Chronic diastolic CHF: continue w/ HD for fluid removal. Monitor I/Os   HTN: continue on home dose of losartan, carvedilol  DM2: poorly controlled, HbA1c 9.0. Continue on SSI w/ accuchecks   CAD: continue on home dose of carvedilol, losartan   Right shoulder pain: No history of fall or injury to the shoulder.  Analgesics as needed.  No acute abnormality on right shoulder x-ray  Morbid obesity: BMI 40.7. Complicates overall care and prognosis   DVT prophylaxis: heparin  Code Status: full  Family Communication: Disposition Plan: likely d/c back home   Level of care: Med-Surg   Status is: Inpatient  Remains inpatient appropriate because:Ongoing diagnostic testing needed not appropriate for outpatient work up, IV treatments appropriate due to intensity of illness or inability to take PO and Inpatient level of care appropriate due to severity of illness   Dispo: The patient is from: Home              Anticipated d/c is to: Home              Patient currently is not medically stable to d/c.   Difficult to place patient : unclear     Consultants:   nephro  Vascular surg    Procedures:    Antimicrobials: vanco, zosyn   Subjective: Pt c/o feeling tired on being in the hospital.   Objective: Vitals:   09/25/20 1614 09/25/20 1956  09/26/20 0005 09/26/20 0358  BP: 102/75 118/80 121/81 137/86  Pulse: 91 94 82 82  Resp: '18 20 20 18  '$ Temp: 98.7 F (37.1 C) 98.9 F (37.2 C) 98.3 F (36.8 C) 98.5 F (36.9 C)  TempSrc: Oral Oral Oral Oral  SpO2: 97% 97% 96% 97%  Weight:      Height:        Intake/Output Summary (Last 24 hours) at 09/26/2020 0726 Last data filed at 09/25/2020 1300 Gross per 24 hour  Intake 640 ml  Output 5 ml  Net 635 ml   Filed Weights   09/23/20 1110  Weight: (!) 140 kg    Examination:  General exam: Appears calm and comfortable  Respiratory system: diminished breath sounds b/l  Cardiovascular system: S1 & S2+. No rubs, gallops or clicks.  Gastrointestinal system: Abdomen is obese, soft and nontender.  Normal bowel sounds heard. Central nervous system: Alert and oriented. Moves all extremities  Psychiatry: Judgement and insight appear normal. Flat mood and affect     Data Reviewed: I have personally reviewed following labs and imaging studies  CBC: Recent Labs  Lab 09/23/20 1212 09/24/20 0433  WBC 8.3 8.0  NEUTROABS 6.4  --   HGB 10.0* 9.6*  HCT 32.4* 30.6*  MCV 89.5 90.0  PLT 225 [id number]   Basic Metabolic Panel: Recent Labs  Lab 09/23/20 1212 09/24/20 0433 09/26/20 0602  NA  133* 136 137  K 4.9 4.7 4.7  CL 95* 98 98  CO2 '23 24 23  '$ GLUCOSE 261* 117* 165*  BUN 44* 53* 42*  CREATININE 14.69* 16.42* 14.90*  CALCIUM 7.3* 7.3* 7.5*  PHOS  --  7.9*  --    GFR: Estimated Creatinine Clearance: 9.1 mL/min (A) (by C-G formula based on SCr of 14.9 mg/dL (H)). Liver Function Tests: Recent Labs  Lab 09/23/20 1212 09/24/20 0433  AST 11*  --   ALT 7  --   ALKPHOS 77  --   BILITOT 0.8  --   PROT 8.1  --   ALBUMIN 3.6 3.2*   No results for input(s): LIPASE, AMYLASE in the last 168 hours. No results for input(s): AMMONIA in the last 168 hours. Coagulation Profile: No results for input(s): INR, PROTIME in the last 168 hours. Cardiac Enzymes: No results for input(s):  CKTOTAL, CKMB, CKMBINDEX, TROPONINI in the last 168 hours. BNP (last 3 results) No results for input(s): PROBNP in the last 8760 hours. HbA1C: Recent Labs    09/23/20 1904  HGBA1C 9.0*   CBG: Recent Labs  Lab 09/24/20 2117 09/25/20 0825 09/25/20 1121 09/25/20 1616 09/25/20 2115  GLUCAP 147* 178* 145* 183* 99   Lipid Profile: No results for input(s): CHOL, HDL, LDLCALC, TRIG, CHOLHDL, LDLDIRECT in the last 72 hours. Thyroid Function Tests: No results for input(s): TSH, T4TOTAL, FREET4, T3FREE, THYROIDAB in the last 72 hours. Anemia Panel: No results for input(s): VITAMINB12, FOLATE, FERRITIN, TIBC, IRON, RETICCTPCT in the last 72 hours. Sepsis Labs: Recent Labs  Lab 09/23/20 1212 09/23/20 1904  LATICACIDVEN 1.0 0.8    Recent Results (from the past 240 hour(s))  Aerobic Culture w Gram Stain (superficial specimen)     Status: None (Preliminary result)   Collection Time: 09/23/20  1:25 PM   Specimen: Hemodialysis Fistula  Result Value Ref Range Status   Specimen Description   Final    HEMODIALYSIS FISTULA Performed at [facility], [address], [place], [place] [address]    Special Requests   Final    NONE Performed at [facility], [address] [place], [place] [address]    Gram Stain   Final    ABUNDANT WBC PRESENT, PREDOMINANTLY PMN RARE GRAM POSITIVE COCCI    Culture   Final    CULTURE REINCUBATED FOR BETTER GROWTH Performed at [facility], [place] [address] [place], [place] [address]    Report Status PENDING  Incomplete  SARS CORONAVIRUS 2 (TAT 6-24 HRS) Nasopharyngeal Nasopharyngeal Swab     Status: None   Collection Time: 09/23/20  1:30 PM   Specimen: Nasopharyngeal Swab  Result Value Ref Range Status   SARS Coronavirus 2 NEGATIVE NEGATIVE Final    Comment: (NOTE) SARS-CoV-2 target nucleic acids are NOT DETECTED.  The SARS-CoV-2 RNA is generally detectable in upper and lower respiratory specimens during the acute phase  of infection. Negative results do not preclude SARS-CoV-2 infection, do not rule out co-infections with other pathogens, and should not be used as the sole basis for treatment or other patient management decisions. Negative results must be combined with clinical observations, patient history, and epidemiological information. The expected result is Negative.  Fact Sheet for Patients: SugarRoll.be  Fact Sheet for Healthcare Providers: https://www.woods-mathews.com/  This test is not yet approved or cleared by the [place] FDA and  has been authorized for detection and/or diagnosis of SARS-CoV-2 by FDA under an Emergency Use Authorization (EUA). This EUA  will remain  in effect (meaning this test can be used) for the duration of the COVID-19 declaration under Se ction 564(b)(1) of the Act, 21 U.S.C. section 360bbb-3(b)(1), unless the authorization is terminated or revoked sooner.  Performed at [facility], [place] [address], [place], [place] [address]   MRSA PCR Screening     Status: None   Collection Time: 09/23/20  9:15 PM   Specimen: Nasopharyngeal  Result Value Ref Range Status   MRSA by PCR NEGATIVE NEGATIVE Final    Comment:        The GeneXpert MRSA Assay (FDA approved for NASAL specimens only), is one component of a comprehensive MRSA colonization surveillance program. It is not intended to diagnose MRSA infection nor to guide or monitor treatment for MRSA infections. Performed at [facility], [address] [place], [place] [address]          Radiology Studies: No results found.      Scheduled Meds: . aspirin EC  81 mg Oral q1800  . calcium acetate  667 mg Oral TID WC  . carvedilol  12.5 mg Oral BID WC  . Chlorhexidine Gluconate Cloth  6 each Topical Daily  . cinacalcet  60 mg Oral q1800  . heparin  5,000 Units Subcutaneous Q8H  . hydrOXYzine  25 mg Oral QHS  . insulin aspart  0-20 Units  Subcutaneous TID WC  . lidocaine-prilocaine   Topical Once  . losartan  50 mg Oral Daily  . pantoprazole  40 mg Oral Daily  . sevelamer carbonate  2.4 g Oral TID WC  . sodium chloride flush  3 mL Intravenous Q12H   Continuous Infusions: . sodium chloride 250 mL (09/24/20 1439)  . piperacillin-tazobactam (ZOSYN)  IV 2.25 g (09/26/20 0600)  . vancomycin 1,000 mg (09/24/20 1442)     LOS: 3 days    Time spent: 32 mins     Wyvonnia Dusky, MD [facility] Pager [phone]  If 7PM-7AM, please contact night-coverage 09/26/2020, 7:26 AM

## 2020-09-26 NOTE — Progress Notes (Signed)
[facility]  PROGRESS NOTE   Subjective:   Patient seen resting in bed Breakfast tray at bedside No complaints at this time Patient seen later in dialysis Per HD RN, venous catheter leaking blood Dialysis discontinued at that time  Objective:  Vital signs in last 24 hours:  Temp:  [98.6 F (37 C)-99.3 F (37.4 C)] 98.7 F (37.1 C) (05/30 0750) Pulse Rate:  [74-85] 83 (05/30 1300) Resp:  [9-20] 12 (05/30 1300) BP: (113-185)/(69-101) 139/92 (05/30 1300) SpO2:  [96 %-100 %] 97 % (05/30 0750)  Weight change:  Filed Weights   09/23/20 1110  Weight: (!) 140 kg    Intake/Output: I/O last 3 completed shifts: In: 90.1 [IV Piggyback:90.1] Out: -    Intake/Output this shift:  No intake/output data recorded.  Physical Exam: General:  No acute distress  Head:  Normocephalic, atraumatic. Moist oral mucosal membranes  Eyes:  Anicteric  Lungs:   Clear to auscultation, normal effort  Heart:  S1S2 no rubs  Abdomen:   Soft, nontender, bowel sounds present  Extremities:  trace peripheral edema.  Neurologic:  Awake, alert, following commands  Skin:  No lesions  Access: Rt IJ Permcath, Lt AVF    Basic Metabolic Panel: Recent Labs  Lab 09/23/20 1212 09/24/20 0433  NA 133* 136  K 4.9 4.7  CL 95* 98  CO2 23 24  GLUCOSE 261* 117*  BUN 44* 53*  CREATININE 14.69* 16.42*  CALCIUM 7.3* 7.3*  PHOS  --  7.9*    Liver Function Tests: Recent Labs  Lab 09/23/20 1212 09/24/20 0433  AST 11*  --   ALT 7  --   ALKPHOS 77  --   BILITOT 0.8  --   PROT 8.1  --   ALBUMIN 3.6 3.2*   No results for input(s): LIPASE, AMYLASE in the last 168 hours. No results for input(s): AMMONIA in the last 168 hours.  CBC: Recent Labs  Lab 09/23/20 1212 09/24/20 0433  WBC 8.3 8.0  NEUTROABS 6.4  --   HGB 10.0* 9.6*  HCT 32.4* 30.6*  MCV 89.5 90.0  PLT 225 210    Cardiac Enzymes: No results for input(s): CKTOTAL, CKMB, CKMBINDEX, TROPONINI in the last 168  hours.  BNP: Invalid input(s): POCBNP  CBG: Recent Labs  Lab 09/25/20 0825 09/25/20 1121 09/25/20 1616 09/25/20 2115 09/26/20 0746  GLUCAP 178* 145* 183* 99 178*    Microbiology: Results for orders placed or performed during the hospital encounter of 09/23/20  Aerobic Culture w Gram Stain (superficial specimen)     Status: None (Preliminary result)   Collection Time: 09/23/20  1:25 PM   Specimen: Hemodialysis Fistula  Result Value Ref Range Status   Specimen Description   Final    HEMODIALYSIS FISTULA Performed at [facility], [address] [place], [place] [address]    Special Requests   Final    NONE Performed at [facility], [address] [place], [place] [address]    Gram Stain   Final    ABUNDANT WBC PRESENT, PREDOMINANTLY PMN RARE GRAM POSITIVE COCCI    Culture   Final    NO GROWTH < 12 HOURS Performed at [facility], [place] [address], [place], [place] [address]    Report Status PENDING  Incomplete  MRSA PCR Screening     Status: None   Collection Time: 09/23/20  9:15 PM   Specimen: Nasopharyngeal  Result Value Ref Range Status   MRSA by PCR NEGATIVE  NEGATIVE Final    Comment:        The GeneXpert MRSA Assay (FDA approved for NASAL specimens only), is one component of a comprehensive MRSA colonization surveillance program. It is not intended to diagnose MRSA infection nor to guide or monitor treatment for MRSA infections. Performed at [facility], [address] [place], [place] [address]     Coagulation Studies: No results for input(s): LABPROT, INR in the last 72 hours.  Urinalysis: No results for input(s): COLORURINE, LABSPEC, PHURINE, GLUCOSEU, HGBUR, BILIRUBINUR, KETONESUR, PROTEINUR, UROBILINOGEN, NITRITE, LEUKOCYTESUR in the last 72 hours.  Invalid input(s): APPERANCEUR    Imaging: DG Shoulder Right  Result Date: 09/23/2020 CLINICAL DATA:  45 year old male with right shoulder pain. EXAM:  RIGHT SHOULDER - 2+ VIEW COMPARISON:  None. FINDINGS: The indwelling tunneled hemodialysis catheter overlies the right shoulder on the AP projection. There is no evidence of fracture or dislocation. There is no evidence of arthropathy or other focal bone abnormality. Soft tissues are unremarkable. IMPRESSION: No acute fracture, malalignment, or significant arthropathy. Electronically Signed   By: Ruthann Cancer MD   On: 09/23/2020 12:20     Medications:   . sodium chloride    . sodium chloride    . sodium chloride    . piperacillin-tazobactam (ZOSYN)  IV 2.25 g (09/24/20 0624)  . vancomycin     . aspirin EC  81 mg Oral q1800  . calcium acetate  667 mg Oral TID WC  . carvedilol  12.5 mg Oral BID WC  . Chlorhexidine Gluconate Cloth  6 each Topical Daily  . cinacalcet  60 mg Oral q1800  . heparin  5,000 Units Subcutaneous Q8H  . hydrOXYzine  25 mg Oral QHS  . insulin aspart  0-20 Units Subcutaneous TID WC  . lidocaine-prilocaine   Topical Once  . losartan  50 mg Oral Daily  . pantoprazole  40 mg Oral Daily  . sevelamer carbonate  2.4 g Oral TID WC  . 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   Dialysis attempted today, but discontinued d/t access concerns by nursing Contacted vascular surgery to evaluate and possible perform HD permcath exchange. Patient made NPO for possible procedure. If able to perform today, will dialyze this evening.   #2 AV fistula ulceration/infection: Patient was given vancomycin yesterday and is on Zosyn.  Appreciate Vascular surgery recs. Lt AVF debridement of access on 09/25/20. Awaiting guidance on when HD can be performed via this  access.    #3 congestive heart failure  Monitoring fluid levels and can manage with HD, if needed    LOS: 3 Colon Flattery, MD [facility] 6/1/202211:55 AM

## 2020-09-26 NOTE — Progress Notes (Signed)
Pt was on dialysis machine less than 30 seconds before leaking noticed.

## 2020-09-26 NOTE — Progress Notes (Signed)
Attempted to run pt, venous line flushed without leaking, however on placing pt on machine, venous line began to leak from connection where hard plastic port attached to flexible line.  Pt immediately clamped due to leaking. Pt was not able to have blood returned with rinseback due to leaking. Dr. Juleen China notified and over to see pt immediately. Decision made by MD to send pt back to vascular to have line pulled and new line placed. No s/s noted to insertion site. Dressing was intact with biopatch inplace.

## 2020-09-26 NOTE — Progress Notes (Signed)
[facility] Daily Progress Note  Subjective: 09/25/20: Excision with debridement of superficial cutaneous ulcer left arm  Patient without complaint.  No acute issues overnight.  Objective: Vitals:   09/26/20 1002 09/26/20 1020 09/26/20 1157 09/26/20 1523  BP: (!) 160/74 (!) 188/84 (!) 146/72 138/83  Pulse: 84 90 79 93  Resp: '16 16 20 15  '$ Temp: 98.4 F (36.9 C)  98.6 F (37 C) 98.6 F (37 C)  TempSrc:   Oral Oral  SpO2: 100% 100% 99% 100%  Weight:      Height:        Intake/Output Summary (Last 24 hours) at 09/26/2020 1548 Last data filed at 09/26/2020 1009 Gross per 24 hour  Intake 120 ml  Output --  Net 120 ml   Physical Exam: A&Ox3, NAD CV: RRR Pulmonary: CTA Bilaterally Abdomen: Soft, Nontender, Nondistended Vascular:  Left upper extremity: OR dressing clean dry and intact.  Extremity is soft. Good bruit and thrill.   Laboratory: CBC    Component Value Date/Time   WBC 8.0 09/24/2020 0433   HGB 9.6 (L) 09/24/2020 0433   HGB 10.9 (L) 02/23/2014 0356   HCT 30.6 (L) 09/24/2020 0433   HCT 34.7 (L) 02/23/2014 0356   PLT 210 09/24/2020 0433   PLT 160 02/23/2014 0356   BMET    Component Value Date/Time   NA 137 09/26/2020 0602   NA 138 02/24/2014 0359   K 4.7 09/26/2020 0602   K 3.5 02/24/2014 0359   CL 98 09/26/2020 0602   CL 97 (L) 02/24/2014 0359   CO2 23 09/26/2020 0602   CO2 31 02/24/2014 0359   GLUCOSE 165 (H) 09/26/2020 0602   GLUCOSE 231 (H) 02/24/2014 0359   BUN 42 (H) 09/26/2020 0602   BUN 38 (H) 02/24/2014 0359   CREATININE 14.90 (H) 09/26/2020 0602   CREATININE 12.04 (H) 02/24/2014 0359   CALCIUM 7.5 (L) 09/26/2020 0602   CALCIUM 7.9 (L) 02/24/2014 0359   GFRNONAA 4 (L) 09/26/2020 0602   GFRNONAA 5 (L) 02/24/2014 0359   GFRAA 3 (L) 05/07/2015 0937   GFRAA 6 (L) 02/24/2014 0359   Assessment/Planning: [name] [name] is a 45 year old male who presents with a draining ulcer overlying his fistula s/p debridement -  POD#1  1) nephrology is requesting a PermCath exchange due to the venous port not functioning appropriately.  Procedure, risks and benefits were explained to the patient.  All questions were answered.  The patient wishes to proceed. We will plan on this tomorrow.  2) doing well status post surgery  Discussed with Dr. Ellis Parents [facility] PA-C 09/26/2020 3:48 PM

## 2020-09-26 NOTE — TOC Progression Note (Addendum)
Transition of Care [facility]) - Progression Note    Patient Details  Name: CID BILLICK MRN: [id number] Date of Birth: November 30, 1975  Transition of Care [facility]) CM/SW Jay, RN Phone Number: 09/26/2020, 12:22 PM  Clinical Narrative:  Laroy Apple with [facility] [phone]   [facility] in to see patient, patient would not state his name when asked for identity check "if you want my name, look at my bracelet". Patient lives at home alone, states he does not have help from friends or family.  Patient states he has a non medical aide, personal care services only as Acupuncturist.    Patient was concerned about lifting his arm and lifting groceries with his recently placed dialysis access. TOC explained we will look into assistance with this, and patient stated "why are you in here asking me questions if you don't know the answers?"    TOC asked Renay, [facility], who stated that the agency will be able to assist with this.  Patient has no concerns getting to appointments, and no concerns getting or taking medications.  Patient does not use any DME at home.  TOC contact information given, TOC to follow for discharge needs.    Expected Discharge Plan: Home/Self Care Barriers to Discharge: Continued Medical Work up  Expected Discharge Plan and Services Expected Discharge Plan: Home/Self Care   Discharge Planning Services: CM Consult   Living arrangements for the past 2 months: Single Family Home                                       Social Determinants of Health (SDOH) Interventions    Readmission Risk Interventions No flowsheet data found.

## 2020-09-27 ENCOUNTER — Ambulatory Visit: Admission: RE | Admit: 2020-09-27 | Payer: Medicare Other | Source: Home / Self Care | Admitting: Vascular Surgery

## 2020-09-27 ENCOUNTER — Inpatient Hospital Stay
Admission: EM | Disposition: A | Discharge status: Home / Self Care | Payer: Self-pay | Source: Home / Self Care | Attending: Internal Medicine

## 2020-09-27 ENCOUNTER — Encounter: Admission: RE | Payer: Self-pay | Source: Home / Self Care

## 2020-09-27 ENCOUNTER — Other Ambulatory Visit (INDEPENDENT_AMBULATORY_CARE_PROVIDER_SITE_OTHER): Payer: Self-pay | Admitting: Nurse Practitioner

## 2020-09-27 DIAGNOSIS — E875 Hyperkalemia: Secondary | ICD-10-CM | POA: Diagnosis not present

## 2020-09-27 DIAGNOSIS — Y712 Prosthetic and other implants, materials and accessory cardiovascular devices associated with adverse incidents: Secondary | ICD-10-CM

## 2020-09-27 DIAGNOSIS — T82594A Other mechanical complication of infusion catheter, initial encounter: Secondary | ICD-10-CM

## 2020-09-27 DIAGNOSIS — T827XXD Infection and inflammatory reaction due to other cardiac and vascular devices, implants and grafts, subsequent encounter: Secondary | ICD-10-CM | POA: Diagnosis not present

## 2020-09-27 DIAGNOSIS — N186 End stage renal disease: Secondary | ICD-10-CM

## 2020-09-27 DIAGNOSIS — Z992 Dependence on renal dialysis: Secondary | ICD-10-CM

## 2020-09-27 HISTORY — PX: DIALYSIS/PERMA CATHETER INSERTION: CATH118288

## 2020-09-27 LAB — BASIC METABOLIC PANEL
Anion gap: 17 — ABNORMAL HIGH (ref 5–15)
BUN: 54 mg/dL — ABNORMAL HIGH (ref 6–20)
CO2: 21 mmol/L — ABNORMAL LOW (ref 22–32)
Calcium: 7.7 mg/dL — ABNORMAL LOW (ref 8.9–10.3)
Chloride: 98 mmol/L (ref 98–111)
Creatinine, Ser: 16.93 mg/dL — ABNORMAL HIGH (ref 0.61–1.24)
GFR, Estimated: 3 mL/min — ABNORMAL LOW (ref >60)
Glucose, Bld: 165 mg/dL — ABNORMAL HIGH (ref 70–99)
Potassium: 5.3 mmol/L — ABNORMAL HIGH (ref 3.5–5.1)
Sodium: 136 mmol/L (ref 135–145)

## 2020-09-27 LAB — GLUCOSE, CAPILLARY
Glucose-Capillary: 160 mg/dL — ABNORMAL HIGH (ref 70–99)
Glucose-Capillary: 129 mg/dL — ABNORMAL HIGH (ref 70–99)
Glucose-Capillary: 238 mg/dL — ABNORMAL HIGH (ref 70–99)
Glucose-Capillary: 139 mg/dL — ABNORMAL HIGH (ref 70–99)

## 2020-09-27 SURGERY — DIALYSIS/PERMA CATHETER REMOVAL
Anesthesia: LOCAL

## 2020-09-27 SURGERY — DIALYSIS/PERMA CATHETER INSERTION
Anesthesia: Moderate Sedation

## 2020-09-27 MED ORDER — SODIUM CHLORIDE 0.9 % IV SOLN: INTRAVENOUS | Status: DC

## 2020-09-27 MED ORDER — DIPHENHYDRAMINE HCL 50 MG/ML IJ SOLN: 50.0000 mg | Freq: Once | INTRAMUSCULAR | Status: DC | PRN

## 2020-09-27 MED ORDER — FAMOTIDINE 20 MG PO TABS: 40.0000 mg | ORAL_TABLET | Freq: Once | ORAL | Status: DC | PRN

## 2020-09-27 MED ORDER — METHYLPREDNISOLONE SODIUM SUCC 125 MG IJ SOLR: 125.0000 mg | Freq: Once | INTRAMUSCULAR | Status: DC | PRN

## 2020-09-27 MED ORDER — FENTANYL CITRATE (PF) 100 MCG/2ML IJ SOLN
INTRAMUSCULAR | Status: DC | PRN
  Administered 2020-09-27: 25 ug via INTRAVENOUS
  Administered 2020-09-27: 50 ug via INTRAVENOUS
  Administered 2020-09-27: 25 ug via INTRAVENOUS

## 2020-09-27 MED ORDER — MIDAZOLAM HCL 2 MG/2ML IJ SOLN
INTRAMUSCULAR | Status: DC | PRN
  Administered 2020-09-27: 2 mg via INTRAVENOUS

## 2020-09-27 MED ORDER — MIDAZOLAM HCL 2 MG/ML PO SYRP: 8.0000 mg | ORAL_SOLUTION | Freq: Once | ORAL | Status: DC | PRN

## 2020-09-27 MED ORDER — ONDANSETRON HCL 4 MG/2ML IJ SOLN: 4.0000 mg | Freq: Four times a day (QID) | INTRAMUSCULAR | Status: DC | PRN

## 2020-09-27 MED ORDER — FENTANYL CITRATE (PF) 100 MCG/2ML IJ SOLN
INTRAMUSCULAR | Status: AC
  Filled 2020-09-27: qty 2

## 2020-09-27 MED ORDER — HYDROMORPHONE HCL 1 MG/ML IJ SOLN
1.0000 mg | Freq: Once | INTRAMUSCULAR | Status: DC | PRN
Start: 2020-09-27 — End: 2020-09-28

## 2020-09-27 MED ORDER — MIDAZOLAM HCL 2 MG/2ML IJ SOLN
INTRAMUSCULAR | Status: AC
  Filled 2020-09-27: qty 2

## 2020-09-27 SURGICAL SUPPLY — 7 items
BIOPATCH RED 1 DISK 7.0 (GAUZE/BANDAGES/DRESSINGS) ×4 IMPLANT
CATH PALINDROME-SP 14.5FX23 (CATHETERS) ×2 IMPLANT
GAUZE SPONGE 4X4 12PLY STRL (GAUZE/BANDAGES/DRESSINGS) ×2 IMPLANT
GUIDEWIRE SUPER STIFF .035X180 (WIRE) ×2 IMPLANT
PACK ANGIOGRAPHY (CUSTOM PROCEDURE TRAY) ×2 IMPLANT
SUT MNCRL AB 4-0 PS2 18 (SUTURE) ×2 IMPLANT
SUT PROLENE 0 CT 1 30 (SUTURE) ×2 IMPLANT

## 2020-09-27 NOTE — Op Note (Signed)
OPERATIVE NOTE    PRE-OPERATIVE DIAGNOSIS: 1. ESRD 2. Non-functional permcath  POST-OPERATIVE DIAGNOSIS: same as above  PROCEDURE: 1. Fluoroscopic guidance for placement of catheter 2. Placement of a 23 cm tip to cuff tunneled hemodialysis catheter via the right internal jugular vein and removal of previous catheter  SURGEON: [name], MD  ANESTHESIA:  Local with moderate conscious sedation for 18 minutes using 2 mg of Versed and 100 mcg of Fentanyl  ESTIMATED BLOOD LOSS: 5 cc  FINDING(S): none  SPECIMEN(S):  None  INDICATIONS:   Patient is a 45 y.o.male who presents with non-functional dialysis catheter and ESRD.  The patient needs long term dialysis access for their ESRD, and a Permcath is necessary.  Risks and benefits are discussed and informed consent is obtained.    DESCRIPTION: After obtaining full informed written consent, the patient was brought back to the vascular suite. The patient received moderate conscious sedation during a face-to-face encounter with me present throughout the entire procedure and supervising the RN monitoring the vital signs, pulse oximetry, telemetry, and mental status throughout the entire procedure. The patient's existing catheter, right neck and chest were sterilely prepped and draped in a sterile surgical field was created.  The existing catheter was dissected free from the fibrous sheath securing the cuff with hemostats and blunt dissection.  A wire was placed. The existing catheter was then removed and the wire used to keep venous access. I selected a 23 cm tip to cuff tunneled dialysis catheter.  Using fluoroscopic guidance the catheter tips were parked in the right atrium. The appropriate distal connectors were placed. It withdrew blood well and flushed easily with heparinized saline and a concentrated heparin solution was then placed. It was secured to the chest wall with 2 Prolene sutures. A 4-0 Monocryl pursestring suture was placed around the  exit site. Sterile dressings were placed. The patient tolerated the procedure well and was taken to the recovery room in stable condition.  COMPLICATIONS: None  CONDITION: Stable  [name] 09/27/2020 3:11 PM   This note was created with Dragon Medical transcription system. Any errors in dictation are purely unintentional.

## 2020-09-27 NOTE — Progress Notes (Signed)
[facility]  PROGRESS NOTE   Subjective:   Patient seen sitting up in chair Currently NPO for procedure Had questions care plan for today and when HD permcath could be removed.   Objective:  Vital signs in last 24 hours:  Temp:  [98.6 F (37 C)-99.3 F (37.4 C)] 98.7 F (37.1 C) (05/30 0750) Pulse Rate:  [74-85] 83 (05/30 1300) Resp:  [9-20] 12 (05/30 1300) BP: (113-185)/(69-101) 139/92 (05/30 1300) SpO2:  [96 %-100 %] 97 % (05/30 0750)  Weight change:  Filed Weights   09/23/20 1110  Weight: (!) 140 kg    Intake/Output: I/O last 3 completed shifts: In: 90.1 [IV Piggyback:90.1] Out: -    Intake/Output this shift:  No intake/output data recorded.  Physical Exam: General:  No acute distress, sitting in chair  Head:  Normocephalic, atraumatic. Moist oral mucosal membranes  Eyes:  Anicteric  Lungs:   Clear to auscultation, normal effort  Heart:  S1S2 no rubs  Abdomen:   Soft, nontender, bowel sounds present  Extremities:  trace peripheral edema.  Neurologic:  Awake, alert, following commands  Skin:  No lesions  Access: Rt IJ Permcath, Lt AVF    Basic Metabolic Panel: Recent Labs  Lab 09/23/20 1212 09/24/20 0433  NA 133* 136  K 4.9 4.7  CL 95* 98  CO2 23 24  GLUCOSE 261* 117*  BUN 44* 53*  CREATININE 14.69* 16.42*  CALCIUM 7.3* 7.3*  PHOS  --  7.9*    Liver Function Tests: Recent Labs  Lab 09/23/20 1212 09/24/20 0433  AST 11*  --   ALT 7  --   ALKPHOS 77  --   BILITOT 0.8  --   PROT 8.1  --   ALBUMIN 3.6 3.2*   No results for input(s): LIPASE, AMYLASE in the last 168 hours. No results for input(s): AMMONIA in the last 168 hours.  CBC: Recent Labs  Lab 09/23/20 1212 09/24/20 0433  WBC 8.3 8.0  NEUTROABS 6.4  --   HGB 10.0* 9.6*  HCT 32.4* 30.6*  MCV 89.5 90.0  PLT 225 210    Cardiac Enzymes: No results for input(s): CKTOTAL, CKMB, CKMBINDEX, TROPONINI in the last 168 hours.  BNP: Invalid input(s):  POCBNP  CBG: Recent Labs  Lab 09/26/20 0746 09/26/20 1155 09/26/20 1522 09/26/20 2023 09/27/20 0823  GLUCAP 178* 122* 176* 158* 160*    Microbiology: Results for orders placed or performed during the hospital encounter of 09/23/20  Aerobic Culture w Gram Stain (superficial specimen)     Status: None (Preliminary result)   Collection Time: 09/23/20  1:25 PM   Specimen: Hemodialysis Fistula  Result Value Ref Range Status   Specimen Description   Final    HEMODIALYSIS FISTULA Performed at [facility], [address], [place], [place] [address]    Special Requests   Final    NONE Performed at [facility], [address] [place], [place] [address]    Gram Stain   Final    ABUNDANT WBC PRESENT, PREDOMINANTLY PMN RARE GRAM POSITIVE COCCI    Culture   Final    NO GROWTH < 12 HOURS Performed at [facility], [place] [address] [place], [place] [address]    Report Status PENDING  Incomplete  MRSA PCR Screening     Status: None   Collection Time: 09/23/20  9:15 PM   Specimen: Nasopharyngeal  Result Value Ref Range Status   MRSA by PCR NEGATIVE NEGATIVE Final  Comment:        The GeneXpert MRSA Assay (FDA approved for NASAL specimens only), is one component of a comprehensive MRSA colonization surveillance program. It is not intended to diagnose MRSA infection nor to guide or monitor treatment for MRSA infections. Performed at [facility], [address] [place], [place] [address]     Coagulation Studies: No results for input(s): LABPROT, INR in the last 72 hours.  Urinalysis: No results for input(s): COLORURINE, LABSPEC, PHURINE, GLUCOSEU, HGBUR, BILIRUBINUR, KETONESUR, PROTEINUR, UROBILINOGEN, NITRITE, LEUKOCYTESUR in the last 72 hours.  Invalid input(s): APPERANCEUR    Imaging: DG Shoulder Right  Result Date: 09/23/2020 CLINICAL DATA:  45 year old male with right shoulder pain. EXAM: RIGHT SHOULDER - 2+ VIEW COMPARISON:   None. FINDINGS: The indwelling tunneled hemodialysis catheter overlies the right shoulder on the AP projection. There is no evidence of fracture or dislocation. There is no evidence of arthropathy or other focal bone abnormality. Soft tissues are unremarkable. IMPRESSION: No acute fracture, malalignment, or significant arthropathy. Electronically Signed   By: Ruthann Cancer MD   On: 09/23/2020 12:20     Medications:   . sodium chloride    . sodium chloride    . sodium chloride    . piperacillin-tazobactam (ZOSYN)  IV 2.25 g (09/24/20 0624)  . vancomycin     . aspirin EC  81 mg Oral q1800  . calcium acetate  667 mg Oral TID WC  . carvedilol  12.5 mg Oral BID WC  . Chlorhexidine Gluconate Cloth  6 each Topical Daily  . cinacalcet  60 mg Oral q1800  . heparin  5,000 Units Subcutaneous Q8H  . hydrOXYzine  25 mg Oral QHS  . insulin aspart  0-20 Units Subcutaneous TID WC  . lidocaine-prilocaine   Topical Once  . losartan  50 mg Oral Daily  . pantoprazole  40 mg Oral Daily  . sevelamer carbonate  2.4 g Oral TID WC  . 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   Dialysis attempted yesterday, but discontinued d/t access concerns by nursing Vascular scheduled to exchange Permcath today Will dialyze after exchange Discussed with patient that permcath will be removed after successful treatments via AVF.    #2 AV fistula ulceration/infection: Patient was given vancomycin yesterday and is on Zosyn.  Appreciate Vascular surgery recs. Lt AVF debridement of access on 09/25/20. Per vascular, AVF can not be used for a few days to recover from procedure. HD Permcath will remain  in place and used for HD treatments.  #3 congestive heart failure  Monitoring fluid levels and can manage with HD, if needed    LOS: [place], MD [facility] 6/2/20229:33 AM

## 2020-09-27 NOTE — Progress Notes (Signed)
Patient refusing CBG check and insulin at this time. Attempting to walk around hospital and go outside. Security accompanied patient on a short walk at approximately 1150.

## 2020-09-27 NOTE — Interval H&P Note (Signed)
History and Physical Interval Note:  09/27/2020 2:50 PM  [name] [name]  has presented today for surgery, with the diagnosis of ESRD.  The various methods of treatment have been discussed with the patient and family. After consideration of risks, benefits and other options for treatment, the patient has consented to  Procedure(s): DIALYSIS/PERMA CATHETER INSERTION (N/A) as a surgical intervention.  The patient's history has been reviewed, patient examined, no change in status, stable for surgery.  I have reviewed the patient's chart and labs.  Questions were answered to the patient's satisfaction.     [name]

## 2020-09-27 NOTE — Progress Notes (Signed)
PROGRESS NOTE    [name] [name]  [id number] DOB: 08/28/1975 DOA: 09/23/2020 PCP: Danelle Berry, NP   Assessment & Plan:   Principal Problem:   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[place]) Active Problems:   End stage renal disease ([place])   Diabetes mellitus with ESRD (end-stage renal disease) ([place])   Hypertension   Coronary artery disease   Obesity, Class III, BMI 40-49.9 (morbid obesity) ([place])    Left AV fistula ulcer/infection: continue on IV vanco. S/p excision w/ debridement of superficial cutaneous ulcer left arm. HD fistula cxs grows rare normal skin flora. Going for permcath exchange today as per vascular surg   ESRD: continue on HD as per nephro. Nephro recs apprec  Hyperkalemia: management w/ HD.    ACD: likely secondary to ESRD. No need for a transfusion currently   Chronic diastolic CHF: continue on HD for fluid removal    HTN: continue on home dose of BB, ARB  DM2: HbA1c 9.0, poorly controlled. Continue on SSI w/ accuchecks  CAD: continue on aspirin, coreg, losartan   Right shoulder pain: No history of fall or injury to the shoulder.  Analgesics as needed.  No acute abnormality on right shoulder x-ray  Morbid obesity: BMI 40.7. Complicates prognosis and overall care   DVT prophylaxis: heparin  Code Status: full  Family Communication: Disposition Plan: likely d/c back home   Level of care: Med-Surg   Status is: Inpatient  Remains inpatient appropriate because:Ongoing diagnostic testing needed not appropriate for outpatient work up, IV treatments appropriate due to intensity of illness or inability to take PO and Inpatient level of care appropriate due to severity of illness   Dispo: The patient is from: Home              Anticipated d/c is to: Home              Patient currently is not medically stable to d/c.   Difficult to place patient : unclear     Consultants:   nephro  Vascular surg    Procedures:    Antimicrobials: vanco     Subjective: Pt c/o feeling hungry   Objective: Vitals:   09/26/20 1523 09/26/20 2015 09/26/20 2351 09/27/20 0436  BP: 138/83 (!) 153/88 (!) 149/87 (!) 163/95  Pulse: 93 90 83 79  Resp: '15 20 16 16  '$ Temp: 98.6 F (37 C) (!) 100.6 F (38.1 C) 98.6 F (37 C) 98.4 F (36.9 C)  TempSrc: Oral Oral Oral Oral  SpO2: 100% 100% 99% 98%  Weight:      Height:        Intake/Output Summary (Last 24 hours) at 09/27/2020 0719 Last data filed at 09/26/2020 1842 Gross per 24 hour  Intake 360 ml  Output --  Net 360 ml   Filed Weights   09/23/20 1110  Weight: (!) 140 kg    Examination:  General exam: Appears comfortable  Respiratory system: decreased breath sounds b/l.  Cardiovascular system: S1/S2+. No clicks or gallops  Gastrointestinal system: Abd is soft, NT, obese & hypoactive bowel sounds  Central nervous system: Alert and oriented. Moves all extremities  Psychiatry: Judgement and insight appear normal. Flat mood and affect     Data Reviewed: I have personally reviewed following labs and imaging studies  CBC: Recent Labs  Lab 09/23/20 1212 09/24/20 0433  WBC 8.3 8.0  NEUTROABS 6.4  --   HGB 10.0* 9.6*  HCT 32.4* 30.6*  MCV 89.5 90.0  PLT 225 210  Basic Metabolic Panel: Recent Labs  Lab 09/23/20 1212 09/24/20 0433 09/26/20 0602 09/27/20 0623  NA 133* 136 137 136  K 4.9 4.7 4.7 5.3*  CL 95* 98 98 98  CO2 '23 24 23 '$ 21*  GLUCOSE 261* 117* 165* 165*  BUN 44* 53* 42* 54*  CREATININE 14.69* 16.42* 14.90* 16.93*  CALCIUM 7.3* 7.3* 7.5* 7.7*  PHOS  --  7.9*  --   --    GFR: Estimated Creatinine Clearance: 8 mL/min (A) (by C-G formula based on SCr of 16.93 mg/dL (H)). Liver Function Tests: Recent Labs  Lab 09/23/20 1212 09/24/20 0433  AST 11*  --   ALT 7  --   ALKPHOS 77  --   BILITOT 0.8  --   PROT 8.1  --   ALBUMIN 3.6 3.2*   No results for input(s): LIPASE, AMYLASE in the last 168 hours. No results for input(s): AMMONIA in the last 168  hours. Coagulation Profile: No results for input(s): INR, PROTIME in the last 168 hours. Cardiac Enzymes: No results for input(s): CKTOTAL, CKMB, CKMBINDEX, TROPONINI in the last 168 hours. BNP (last 3 results) No results for input(s): PROBNP in the last 8760 hours. HbA1C: No results for input(s): HGBA1C in the last 72 hours. CBG: Recent Labs  Lab 09/25/20 2115 09/26/20 0746 09/26/20 1155 09/26/20 1522 09/26/20 2023  GLUCAP 99 178* 122* 176* 158*   Lipid Profile: No results for input(s): CHOL, HDL, LDLCALC, TRIG, CHOLHDL, LDLDIRECT in the last 72 hours. Thyroid Function Tests: No results for input(s): TSH, T4TOTAL, FREET4, T3FREE, THYROIDAB in the last 72 hours. Anemia Panel: No results for input(s): VITAMINB12, FOLATE, FERRITIN, TIBC, IRON, RETICCTPCT in the last 72 hours. Sepsis Labs: Recent Labs  Lab 09/23/20 1212 09/23/20 1904  LATICACIDVEN 1.0 0.8    Recent Results (from the past 240 hour(s))  Aerobic Culture w Gram Stain (superficial specimen)     Status: None   Collection Time: 09/23/20  1:25 PM   Specimen: Hemodialysis Fistula  Result Value Ref Range Status   Specimen Description   Final    HEMODIALYSIS FISTULA Performed at [facility], [address] [place], [place] [address]    Special Requests   Final    NONE Performed at [facility], [address] [place], [place] [address]    Gram Stain   Final    ABUNDANT WBC PRESENT, PREDOMINANTLY PMN RARE GRAM POSITIVE COCCI    Culture   Final    RARE NORMAL SKIN FLORA NO GROUP A STREP (S.PYOGENES) ISOLATED NO STAPHYLOCOCCUS AUREUS ISOLATED Performed at [facility], [place] [address] [place], [place] [address]    Report Status 09/26/2020 FINAL  Final  SARS CORONAVIRUS 2 (TAT 6-24 HRS) Nasopharyngeal Nasopharyngeal Swab     Status: None   Collection Time: 09/23/20  1:30 PM   Specimen: Nasopharyngeal Swab  Result Value Ref Range Status   SARS Coronavirus 2 NEGATIVE NEGATIVE  Final    Comment: (NOTE) SARS-CoV-2 target nucleic acids are NOT DETECTED.  The SARS-CoV-2 RNA is generally detectable in upper and lower respiratory specimens during the acute phase of infection. Negative results do not preclude SARS-CoV-2 infection, do not rule out co-infections with other pathogens, and should not be used as the sole basis for treatment or other patient management decisions. Negative results must be combined with clinical observations, patient history, and epidemiological information. The expected result is Negative.  Fact Sheet for Patients: SugarRoll.be  Fact Sheet for Healthcare Providers: https://www.woods-mathews.com/  This test is not yet approved or cleared by the [place] and  has been authorized for detection and/or diagnosis of SARS-CoV-2 by FDA under an Emergency Use Authorization (EUA). This EUA will remain  in effect (meaning this test can be used) for the duration of the COVID-19 declaration under Se ction 564(b)(1) of the Act, 21 U.S.C. section 360bbb-3(b)(1), unless the authorization is terminated or revoked sooner.  Performed at [facility], [place] [address] [place], [place] [address]   MRSA PCR Screening     Status: None   Collection Time: 09/23/20  9:15 PM   Specimen: Nasopharyngeal  Result Value Ref Range Status   MRSA by PCR NEGATIVE NEGATIVE Final    Comment:        The GeneXpert MRSA Assay (FDA approved for NASAL specimens only), is one component of a comprehensive MRSA colonization surveillance program. It is not intended to diagnose MRSA infection nor to guide or monitor treatment for MRSA infections. Performed at [facility], [address] [place], [place] [address]          Radiology Studies: No results found.      Scheduled Meds: . aspirin EC  81 mg Oral q1800  . calcium acetate  667 mg Oral TID WC  . carvedilol  12.5 mg Oral BID WC  .  Chlorhexidine Gluconate Cloth  6 each Topical Daily  . cinacalcet  60 mg Oral q1800  . heparin  5,000 Units Subcutaneous Q8H  . hydrOXYzine  25 mg Oral QHS  . insulin aspart  0-20 Units Subcutaneous TID WC  . lidocaine-prilocaine   Topical Once  . losartan  50 mg Oral Daily  . pantoprazole  40 mg Oral Daily  . sevelamer carbonate  2.4 g Oral TID WC  . sodium chloride flush  3 mL Intravenous Q12H   Continuous Infusions: . sodium chloride 250 mL (09/24/20 1439)  . vancomycin 1,000 mg (09/24/20 1442)     LOS: 4 days    Time spent: 30 mins     Wyvonnia Dusky, MD [facility] Pager [phone]  If 7PM-7AM, please contact night-coverage 09/27/2020, 7:19 AM

## 2020-09-27 NOTE — Progress Notes (Signed)
Hemodialysis patient known at [facility] MWF 6:00am, patient normally drives self to appointments. Asked patient if he had any dialysis concerns. Discussed some concerns regarding clinic. Patient did not understand why he is always asked if he needs to go to the hospital when he is not feeling well, we discussed why the clinic asks these type of questions. Patient seemed to understand. Please contact me with any dialysis placement concerns.  Elvera Bicker Dialysis Coordinator [phone]

## 2020-09-27 NOTE — Interval H&P Note (Signed)
History and Physical Interval Note:  09/27/2020 2:51 PM  [name] [name]  has presented today for surgery, with the diagnosis of ESRD.  The various methods of treatment have been discussed with the patient and family. After consideration of risks, benefits and other options for treatment, the patient has consented to  Procedure(s): DIALYSIS/PERMA CATHETER INSERTION (N/A) as a surgical intervention.  The patient's history has been reviewed, patient examined, no change in status, stable for surgery.  I have reviewed the patient's chart and labs.  Questions were answered to the patient's satisfaction.     [name]

## 2020-09-28 ENCOUNTER — Encounter: Payer: Self-pay | Admitting: Vascular Surgery

## 2020-09-28 DIAGNOSIS — E1122 Type 2 diabetes mellitus with diabetic chronic kidney disease: Secondary | ICD-10-CM | POA: Diagnosis not present

## 2020-09-28 DIAGNOSIS — T827XXD Infection and inflammatory reaction due to other cardiac and vascular devices, implants and grafts, subsequent encounter: Secondary | ICD-10-CM | POA: Diagnosis not present

## 2020-09-28 DIAGNOSIS — N186 End stage renal disease: Secondary | ICD-10-CM | POA: Diagnosis not present

## 2020-09-28 LAB — CBC
HCT: 31.1 % — ABNORMAL LOW (ref 39.0–52.0)
Hemoglobin: 9.9 g/dL — ABNORMAL LOW (ref 13.0–17.0)
MCH: 27.9 pg (ref 26.0–34.0)
MCHC: 31.8 g/dL (ref 30.0–36.0)
MCV: 87.6 fL (ref 80.0–100.0)
Platelets: 193 10*3/uL (ref 150–400)
RBC: 3.55 MIL/uL — ABNORMAL LOW (ref 4.22–5.81)
RDW: 15.2 % (ref 11.5–15.5)
WBC: 7.3 10*3/uL (ref 4.0–10.5)
nRBC: 0 % (ref 0.0–0.2)

## 2020-09-28 LAB — BASIC METABOLIC PANEL
Anion gap: 14 (ref 5–15)
BUN: 32 mg/dL — ABNORMAL HIGH (ref 6–20)
CO2: 25 mmol/L (ref 22–32)
Calcium: 8.1 mg/dL — ABNORMAL LOW (ref 8.9–10.3)
Chloride: 96 mmol/L — ABNORMAL LOW (ref 98–111)
Creatinine, Ser: 9.78 mg/dL — ABNORMAL HIGH (ref 0.61–1.24)
GFR, Estimated: 6 mL/min — ABNORMAL LOW (ref >60)
Glucose, Bld: 145 mg/dL — ABNORMAL HIGH (ref 70–99)
Potassium: 3.8 mmol/L (ref 3.5–5.1)
Sodium: 135 mmol/L (ref 135–145)

## 2020-09-28 LAB — VANCOMYCIN, RANDOM: Vancomycin Rm: 19

## 2020-09-28 LAB — GLUCOSE, CAPILLARY: Glucose-Capillary: 154 mg/dL — ABNORMAL HIGH (ref 70–99)

## 2020-09-28 MED ORDER — VANCOMYCIN HCL IN DEXTROSE 1-5 GM/200ML-% IV SOLN: 1000.0000 mg | INTRAVENOUS | 0 refills | Status: AC

## 2020-09-28 NOTE — Progress Notes (Signed)
Patient's blood lines started clotting with 30 min left of treatment , had to rinse patient back , RN and doctor aware.

## 2020-09-28 NOTE — Progress Notes (Addendum)
Pharmacy Antibiotic Note  [name] [name] is a 45 y.o. male with history of HTN, CAD, HTN, DM, PVD, and ESRD on HD (MWF) admitted on 09/23/2020 with wound infection (possibly from AV fistula). Pt AV fistula has been used successfully and the plan was for the PermCath to be removed 09/27/20. Pt reported he noticed a "bubble" on his left arm at the bottom of his fistula. Pt states it burst today and started draining purulent fluid and some blood.  Pharmacy has been consulted for Vancomycin and Zosyn dosing.  Addendum Vanc RL: (6/03 '@1226'$  was 19 mcg/ml) expect this to be falsely low since taken during last hour of dialysis session. Will recheck 6hr post-HD level, however if patient discharges prior to next level tonight then no need to redose. Level will likely be higher.  Plan:  Pt received 2.5g IV vanc loading dose (5/29 1530);  followed by vancomycin 1g IV MWF after dialysis  - Vanc 1g:   - (HD 5/30 dosed 1g at 1442)    - (HD 6/1 cancelled; not dosed, access replaced 6/2)   - (HD 6/3 full session; assess if dosing per below)  Because of interruption in HD cycle and no doses since 5/30, We will check a vanc random level 6hrs post-HD to assess drug level after redistribution phase.   - If b/n 15-64mg/ml; no supplemental dose req'd.  - if b/n 10-136m/ml; supplement with 1g x1  - if <<1017mmL; consider reloading as appropriate.   Obtain vancomycin levels around 4th or 5th dose if continued   Follow up cultures and pt clinical course and de-escalate antibiotics as appropriate  - Zosyn disctontinued per culture results. Height: '6\' 1"'$  (185.4 cm) Weight: (!) 140 kg (308 lb 10.3 oz) IBW/kg (Calculated) : 79.9  Temp (24hrs), Avg:98.2 F (36.8 C), Min:97.8 F (36.6 C), Max:98.4 F (36.9 C)  Recent Labs  Lab 09/23/20 1212 09/23/20 1904 09/24/20 0433 09/26/20 0602 09/27/20 0623  WBC 8.3  --  8.0  --   --   CREATININE 14.69*  --  16.42* 14.90* 16.93*  LATICACIDVEN 1.0 0.8  --   --   --      Estimated Creatinine Clearance: 8 mL/min (A) (by C-G formula based on SCr of 16.93 mg/dL (H)).    Allergies  Allergen Reactions  . Cephalexin Nausea Only  . Ivp Dye [Iodinated Diagnostic Agents] Diarrhea  . Lisinopril Other (See Comments)    Pt cannot recall reaction, cough  . Other     Antimicrobials this admission: 5/29 Vancomycin >> 5/29 Zosyn >> 6/1  Dose adjustments this admission: ESRD-HD MWF  Microbiology results: 5/29 Wound culture: Rare GPCs; rare normal skin flora  Thank you for allowing pharmacy to be a part of this patient's care.  BraLorna DibbleharmD Pharmacy Resident  09/28/2020 8:36 AM

## 2020-09-28 NOTE — Progress Notes (Signed)
Spoke with Shae in central telemetry at 0929 to transfer patient from room 128 to room 206-2. Patient requested to increase UF from 2500 to 3000 doctor has approved the increase, RN aware.

## 2020-09-28 NOTE — Discharge Summary (Signed)
Physician Discharge Summary  [name] [name] [id number] DOB: 1975-05-14 DOA: 09/23/2020  PCP: Danelle Berry, NP  Admit date: 09/23/2020 Discharge date: 09/28/2020  Admitted From: home  Disposition:  Home   Recommendations for Outpatient Follow-up:  1. Follow up with PCP in 1-2 weeks 2. F/u nephro in 1 week  3. F/u vascular surg, Dr. Delana Meyer, in 1 week   Home Health: no Equipment/Devices:  Discharge Condition: stable  CODE STATUS: full Diet recommendation: Heart Healthy / Carb Modified / Renal  Brief/Interim Summary: HPI was taken from Dr. Francine Graven: [name] [name] is a 45 y.o. male with medical history significant for diabetes mellitus with complications of end stage renal disease on hemodialysis, dialysis days are Monday/Wednesday/Friday, hypertension, coronary artery disease and CHF who presents to the ER for evaluation of purulent drainage from around his left AV fistula. Patient initially had issues with his left AV fistula and so had a PermCath placed in April for dialysis.  His AV fistula has been used successfully and the plan was for the PermCath to be removed on 09/27/20. Per patient he noticed a "bubble" at the bottom of his AV fistula and infected on the morning of his admission and started draining purulent material and blood.  This AV fistula was used on Friday for dialysis and he states that everything was fine on that day. He also complains of pain in his right shoulder and has an area of induration and point tenderness over his right shoulder.   He denies having any fever or chills, no chest pain, no shortness of breath, no nausea, no vomiting, no lower extremity swelling, no dizziness, no lightheadedness, no abdominal pain, no changes in his bowel habits, no cough, no palpitations or diaphoresis. Labs show sodium 133, potassium 4.9, chloride 95, bicarb 23, glucose 261, BUN 44, creatinine 14.6, calcium 7.3, alkaline phosphatase 77, albumin 3.6, AST 11, ALT 7, total  protein 8.1, lactic acid 1.0, white count 8.3, hemoglobin 10.0 MCV 89.5, RDW 14.9, platelet count 225 Respiratory viral panel is pending Right shoulder x-ray shows no acute fracture, malalignment or significant arthropathy.     ED Course: Patient is a 45 year old African-American male who presents to the ER for evaluation of an ulcerated area in close proximity to his left AV fistula with purulent drainage. Patient received empiric antibiotic therapy with vancomycin and Zosyn and will be admitted to the hospital for further evaluation  Hospital course from Dr. Jimmye Norman 6/1-09/28/20: Pt presented w/ left AV fistula ulcer/infection was initially treated w/ IV vanco & zosyn. Pt had excision w/ debridement of superficial cutaneous ulcer by vascular surg. HD fistula cx grew rare normal skin flora. IV zosyn was d/c and IV vanco was continued MWF on HD days x 7 days. For more information, please see previous progress/consult notes.   Discharge Diagnoses:  Principal Problem:   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[place]) Active Problems:   End stage renal disease ([place])   Diabetes mellitus with ESRD (end-stage renal disease) ([facility])   Hypertension   Coronary artery disease   Obesity, Class III, BMI 40-49.9 (morbid obesity) ([place])  Left AV fistula ulcer/infection: continue on IV vanco on MWF x 7 days. S/p excision w/ debridement of superficial cutaneous ulcer left arm. HD fistula cxs grows rare normal skin flora. Going for permcath exchange today as per vascular surg   ESRD: continue on HD as per nephro. Nephro recs apprec  Hyperkalemia: management w/ HD.    ACD: likely secondary to ESRD. No need for a transfusion  currently   Chronic diastolic CHF: continue on HD for fluid removal    HTN: continue on home dose of BB, ARB  DM2: HbA1c 9.0, poorly controlled. Continue on SSI w/ accuchecks  CAD: continue on aspirin, coreg, losartan   Right shoulder pain: No history of fall or injury to the shoulder.   Analgesics as needed.  No acute abnormality on right shoulder x-ray  Morbid obesity: BMI 40.7. Complicates prognosis and overall care   Discharge Instructions  Discharge Instructions    Diet - low sodium heart healthy   Complete by: As directed    Renal & carb modified diet   Discharge instructions   Complete by: As directed    F/u w/ nephro in 1 week. F/u w/ PCP in 1-2 weeks   Increase activity slowly   Complete by: As directed    No wound care   Complete by: As directed      Allergies as of 09/28/2020      Reactions   Cephalexin Nausea Only   Ivp Dye [iodinated Diagnostic Agents] Diarrhea   Lisinopril Other (See Comments)   Pt cannot recall reaction, cough   Other       Medication List    TAKE these medications   aspirin EC 81 MG tablet Take 81 mg by mouth daily at 6 PM. (1700)   calcium acetate 667 MG capsule Commonly known as: PHOSLO Take by mouth in the morning and at bedtime. Takes in AM   carvedilol 12.5 MG tablet Commonly known as: COREG Take 12.5 mg by mouth 2 (two) times daily with a meal.   clindamycin 1 % gel Commonly known as: CLINDAGEL Apply topically.   HumuLIN 70/30 (70-30) 100 UNIT/ML injection Generic drug: insulin NPH-regular Human Inject 10-30 Units into the skin See admin instructions. 10 units before breakfast and 30 units before supper ON DIALYSIS DAYS (M-W-F), NO AM INSULIN   hydrOXYzine 25 MG tablet Commonly known as: ATARAX/VISTARIL Take 25 mg by mouth at bedtime.   lidocaine-prilocaine cream Commonly known as: EMLA Apply topically.   loperamide 2 MG capsule Commonly known as: IMODIUM Take 2 mg by mouth as needed for diarrhea or loose stools (Take 1/2-1 tablet as meeded to prevent diarrhea. Can take up to 1 tablet every 4 hours. Do not exceed 4 tablets in a day.).   losartan 50 MG tablet Commonly known as: COZAAR Take 50 mg by mouth daily. (1700)   mupirocin ointment 2 % Commonly known as: BACTROBAN Apply 1 application  topically 2 (two) times daily.   omeprazole 20 MG capsule Commonly known as: PRILOSEC Take 20 mg by mouth daily.   oxyCODONE-acetaminophen 5-325 MG tablet Commonly known as: PERCOCET/ROXICET Take 1 tablet by mouth every 6 (six) hours as needed for severe pain.   Sensipar 60 MG tablet Generic drug: cinacalcet Take 60 mg by mouth daily at 6 PM. 1700   sevelamer carbonate 2.4 g Pack Commonly known as: RENVELA   vancomycin 1-5 GM/200ML-% Soln Commonly known as: VANCOCIN Inject 200 mLs (1,000 mg total) into the vein every Monday, Wednesday, and Friday with hemodialysis for 7 days.   vitamin A 25000 UNIT capsule Take 25,000 Units by mouth daily.   vitamin E 1000 UNIT capsule Take 1,000 Units by mouth daily. Not sure of dose; started 4/29       Follow-up Information    Schnier, Dolores Lory, MD Follow up in 1 week(s).   Specialties: Vascular Surgery, Cardiology, Radiology, Vascular Surgery Why: First post-op incision  check. Can see Civil engineer, contracting or Arna Medici.  Contact information: [place] [address] [phone]              Allergies  Allergen Reactions  . Cephalexin Nausea Only  . Ivp Dye [Iodinated Diagnostic Agents] Diarrhea  . Lisinopril Other (See Comments)    Pt cannot recall reaction, cough  . Other     Consultations:  Vascular surg  nephro    Procedures/Studies: DG Shoulder Right  Result Date: 09/23/2020 CLINICAL DATA:  45 year old male with right shoulder pain. EXAM: RIGHT SHOULDER - 2+ VIEW COMPARISON:  None. FINDINGS: The indwelling tunneled hemodialysis catheter overlies the right shoulder on the AP projection. There is no evidence of fracture or dislocation. There is no evidence of arthropathy or other focal bone abnormality. Soft tissues are unremarkable. IMPRESSION: No acute fracture, malalignment, or significant arthropathy. Electronically Signed   By: Ruthann Cancer MD   On: 09/23/2020 12:20   PERIPHERAL VASCULAR  CATHETERIZATION  Result Date: 09/27/2020 See op note  VAS [place] (AVF, AVG)  Result Date: 09/25/2020 DIALYSIS ACCESS Patient Name:  ZAILEN STATZER  Date of Exam:   09/20/2020 Medical Rec #: [id number]        Accession #:    [id number] Date of Birth: Aug 31, 1975       Patient Gender: M Patient Age:   56Y Exam Location:  [facility] Procedure:      VAS US DUPLEX DIALYSIS ACCESS (AVF, AVG) Referring Phys: [id number] [place] --------------------------------------------------------------------------------  Reason for Exam: Routine follow up. Access Site: Left Upper Extremity. Access Type: Brachial-cephalic AVF. History: 05/17/2020 Left PTA of left cephalic to subclavian vein confluence.           07/16/2020: Left Arm Fistulagram including central Venogram. PTA of the          Left Cephalic vein, Subclavian Vein and Confluence.          08/2020 left avf artegraft thrombectomy and stents. Performing Technologist: Concha Norway RVT  Examination Guidelines: A complete evaluation includes B-mode imaging, spectral Doppler, color Doppler, and power Doppler as needed of all accessible portions of each vessel. Unilateral testing is considered an integral part of a complete examination. Limited examinations for reoccurring indications may be performed as noted.  Findings: +--------------------+----------+-----------------+--------+ AVF                 PSV (cm/s)Flow Vol (mL/min)Comments +--------------------+----------+-----------------+--------+ Native artery inflow   232          2199                +--------------------+----------+-----------------+--------+ AVF Anastomosis        174                              +--------------------+----------+-----------------+--------+  +---------------+----------+-------------+----------+--------+ OUTFLOW VEIN   PSV (cm/s)Diameter (cm)Depth (cm)Describe +---------------+----------+-------------+----------+--------+ Subclavian vein    136                                    +---------------+----------+-------------+----------+--------+ Confluence        159                                    +---------------+----------+-------------+----------+--------+ Prox UA  163                            stent   +---------------+----------+-------------+----------+--------+ Mid UA            218                                    +---------------+----------+-------------+----------+--------+ Dist UA           223                            stent   +---------------+----------+-------------+----------+--------+  +---------------+------------+----------+---------+--------+------------------+                  Diameter  Depth (cm)Branching  PSV      Flow Volume                        (cm)                        (cm/s)      (ml/min)      +---------------+------------+----------+---------+--------+------------------+ Left Rad Art                                     40                      dis                                                                      +---------------+------------+----------+---------+--------+------------------+ antegrade                                                                +---------------+------------+----------+---------+--------+------------------+  Summary: Patent left brachiocephalic AVF and stents with no evidence of significant stenosis.  *See table(s) above for measurements and observations.  Diagnosing physician: [name] MD Electronically signed by [name] MD on 09/25/2020 at 12:41:26 PM.   --------------------------------------------------------------------------------   Final       Subjective: Pt c/o fatigue    Discharge Exam: Vitals:   09/28/20 1245 09/28/20 1300  BP: (!) 182/96 (!) 186/106  Pulse: 96 94  Resp: 16 14  Temp:    SpO2:     Vitals:   09/28/20 1215 09/28/20 1230 09/28/20 1245 09/28/20 1300  BP: (!) 168/106 (!)  169/106 (!) 182/96 (!) 186/106  Pulse: 93 93 96 94  Resp: '14 14 16 14  '$ Temp:      TempSrc:      SpO2:      Weight:      Height:        General: Pt is alert, awake, not in acute distress Cardiovascular: S1/S2 +, no rubs, no gallops Respiratory: CTA bilaterally, no wheezing, no rhonchi Abdominal: Soft, NT, obese,  bowel sounds + Extremities: no cyanosis    The results of significant diagnostics from this hospitalization (including imaging, microbiology, ancillary and laboratory) are listed below for reference.     Microbiology: Recent Results (from the past 240 hour(s))  Aerobic Culture w Gram Stain (superficial specimen)     Status: None   Collection Time: 09/23/20  1:25 PM   Specimen: Hemodialysis Fistula  Result Value Ref Range Status   Specimen Description   Final    HEMODIALYSIS FISTULA Performed at [facility], [address] [place], [place] [address]    Special Requests   Final    NONE Performed at [facility], [address] [place], [place] [address]    Gram Stain   Final    ABUNDANT WBC PRESENT, PREDOMINANTLY PMN RARE GRAM POSITIVE COCCI    Culture   Final    RARE NORMAL SKIN FLORA NO GROUP A STREP (S.PYOGENES) ISOLATED NO STAPHYLOCOCCUS AUREUS ISOLATED Performed at [facility], [place] [address] [place], [place] [address]    Report Status 09/26/2020 FINAL  Final  SARS CORONAVIRUS 2 (TAT 6-24 HRS) Nasopharyngeal Nasopharyngeal Swab     Status: None   Collection Time: 09/23/20  1:30 PM   Specimen: Nasopharyngeal Swab  Result Value Ref Range Status   SARS Coronavirus 2 NEGATIVE NEGATIVE Final    Comment: (NOTE) SARS-CoV-2 target nucleic acids are NOT DETECTED.  The SARS-CoV-2 RNA is generally detectable in upper and lower respiratory specimens during the acute phase of infection. Negative results do not preclude SARS-CoV-2 infection, do not rule out co-infections with other pathogens, and should not be used as the sole basis  for treatment or other patient management decisions. Negative results must be combined with clinical observations, patient history, and epidemiological information. The expected result is Negative.  Fact Sheet for Patients: SugarRoll.be  Fact Sheet for Healthcare Providers: https://www.woods-mathews.com/  This test is not yet approved or cleared by the [place] FDA and  has been authorized for detection and/or diagnosis of SARS-CoV-2 by FDA under an Emergency Use Authorization (EUA). This EUA will remain  in effect (meaning this test can be used) for the duration of the COVID-19 declaration under Se ction 564(b)(1) of the Act, 21 U.S.C. section 360bbb-3(b)(1), unless the authorization is terminated or revoked sooner.  Performed at [facility], [place] [address], [place], [place] [address]   MRSA PCR Screening     Status: None   Collection Time: 09/23/20  9:15 PM   Specimen: Nasopharyngeal  Result Value Ref Range Status   MRSA by PCR NEGATIVE NEGATIVE Final    Comment:        The GeneXpert MRSA Assay (FDA approved for NASAL specimens only), is one component of a comprehensive MRSA colonization surveillance program. It is not intended to diagnose MRSA infection nor to guide or monitor treatment for MRSA infections. Performed at [facility], [address] [place], [place] [address]      Labs: BNP (last 3 results) No results for input(s): BNP in the last 8760 hours. Basic Metabolic Panel: Recent Labs  Lab 09/23/20 1212 09/24/20 0433 09/26/20 0602 09/27/20 0623 09/28/20 1226  NA 133* 136 137 136 135  K 4.9 4.7 4.7 5.3* 3.8  CL 95* 98 98 98 96*  CO2 '23 24 23 '$ 21* 25  GLUCOSE 261* 117* 165* 165* 145*  BUN 44* 53* 42* 54* 32*  CREATININE 14.69* 16.42* 14.90* 16.93* 9.78*  CALCIUM 7.3* 7.3* 7.5* 7.7* 8.1*  PHOS  --  7.9*  --   --   --    Liver Function Tests: Recent Labs  Lab 09/23/20 1212  09/24/20 0433  AST 11*  --   ALT 7  --   ALKPHOS 77  --   BILITOT 0.8  --   PROT 8.1  --   ALBUMIN 3.6 3.2*   No results for input(s): LIPASE, AMYLASE in the last 168 hours. No results for input(s): AMMONIA in the last 168 hours. CBC: Recent Labs  Lab 09/23/20 1212 09/24/20 0433 09/28/20 1226  WBC 8.3 8.0 7.3  NEUTROABS 6.4  --   --   HGB 10.0* 9.6* 9.9*  HCT 32.4* 30.6* 31.1*  MCV 89.5 90.0 87.6  PLT 225 210 193   Cardiac Enzymes: No results for input(s): CKTOTAL, CKMB, CKMBINDEX, TROPONINI in the last 168 hours. BNP: Invalid input(s): POCBNP CBG: Recent Labs  Lab 09/27/20 0823 09/27/20 1411 09/27/20 1726 09/27/20 2314 09/28/20 0842  GLUCAP 160* 129* 238* 139* 154*   D-Dimer No results for input(s): DDIMER in the last 72 hours. Hgb A1c No results for input(s): HGBA1C in the last 72 hours. Lipid Profile No results for input(s): CHOL, HDL, LDLCALC, TRIG, CHOLHDL, LDLDIRECT in the last 72 hours. Thyroid function studies No results for input(s): TSH, T4TOTAL, T3FREE, THYROIDAB in the last 72 hours.  Invalid input(s): FREET3 Anemia work up No results for input(s): VITAMINB12, FOLATE, FERRITIN, TIBC, IRON, RETICCTPCT in the last 72 hours. Urinalysis    Component Value Date/Time   COLORURINE Yellow 02/22/2014 1640   APPEARANCEUR Clear 02/22/2014 1640   LABSPEC 1.012 02/22/2014 1640   PHURINE 8.0 02/22/2014 1640   GLUCOSEU >=500 02/22/2014 1640   HGBUR Negative 02/22/2014 1640   BILIRUBINUR Negative 02/22/2014 1640   KETONESUR Negative 02/22/2014 1640   PROTEINUR >=500 02/22/2014 1640   NITRITE Negative 02/22/2014 1640   LEUKOCYTESUR Negative 02/22/2014 1640   Sepsis Labs Invalid input(s): PROCALCITONIN,  WBC,  LACTICIDVEN Microbiology Recent Results (from the past 240 hour(s))  Aerobic Culture w Gram Stain (superficial specimen)     Status: None   Collection Time: 09/23/20  1:25 PM   Specimen: Hemodialysis Fistula  Result Value Ref Range Status    Specimen Description   Final    HEMODIALYSIS FISTULA Performed at [facility], [address] [place], [place] [address]    Special Requests   Final    NONE Performed at [facility], [address] [place], [place] [address]    Gram Stain   Final    ABUNDANT WBC PRESENT, PREDOMINANTLY PMN RARE GRAM POSITIVE COCCI    Culture   Final    RARE NORMAL SKIN FLORA NO GROUP A STREP (S.PYOGENES) ISOLATED NO STAPHYLOCOCCUS AUREUS ISOLATED Performed at [facility], [place] [address] [place], [place] [address]    Report Status 09/26/2020 FINAL  Final  SARS CORONAVIRUS 2 (TAT 6-24 HRS) Nasopharyngeal Nasopharyngeal Swab     Status: None   Collection Time: 09/23/20  1:30 PM   Specimen: Nasopharyngeal Swab  Result Value Ref Range Status   SARS Coronavirus 2 NEGATIVE NEGATIVE Final    Comment: (NOTE) SARS-CoV-2 target nucleic acids are NOT DETECTED.  The SARS-CoV-2 RNA is generally detectable in upper and lower respiratory specimens during the acute phase of infection. Negative results do not preclude SARS-CoV-2 infection, do not rule out co-infections with other pathogens, and should not be used as the sole basis for treatment or other patient management decisions. Negative results  must be combined with clinical observations, patient history, and epidemiological information. The expected result is Negative.  Fact Sheet for Patients: SugarRoll.be  Fact Sheet for Healthcare Providers: https://www.woods-mathews.com/  This test is not yet approved or cleared by the [place] FDA and  has been authorized for detection and/or diagnosis of SARS-CoV-2 by FDA under an Emergency Use Authorization (EUA). This EUA will remain  in effect (meaning this test can be used) for the duration of the COVID-19 declaration under Se ction 564(b)(1) of the Act, 21 U.S.C. section 360bbb-3(b)(1), unless the authorization is terminated  or revoked sooner.  Performed at [facility], [place] [address], [place], [place] [address]   MRSA PCR Screening     Status: None   Collection Time: 09/23/20  9:15 PM   Specimen: Nasopharyngeal  Result Value Ref Range Status   MRSA by PCR NEGATIVE NEGATIVE Final    Comment:        The GeneXpert MRSA Assay (FDA approved for NASAL specimens only), is one component of a comprehensive MRSA colonization surveillance program. It is not intended to diagnose MRSA infection nor to guide or monitor treatment for MRSA infections. Performed at [facility], [address] [place], [place] [address]      Time coordinating discharge: Over 30 minutes  SIGNED:   Wyvonnia Dusky, MD  [facility] 09/28/2020, 2:30 PM Pager   If 7PM-7AM, please contact night-coverage

## 2020-09-28 NOTE — Progress Notes (Signed)
CVC Dressing changed, no bleeding, insert site dry. TA

## 2020-09-28 NOTE — Care Management Important Message (Signed)
Important Message  Patient Details  Name: [name] [name] MRN: [id number] Date of Birth: 03/14/1976   Medicare Important Message Given:  Yes  Patient was out of the room for a procedure.  Left a copy of the Important Message from Medicare on his beside table to review at his leisure.  Juliann Pulse A Chaneka Trefz 09/28/2020, 11:53 AM

## 2020-09-28 NOTE — Progress Notes (Addendum)
[facility]  PROGRESS NOTE   Subjective:   Patient seen during dialysis   HEMODIALYSIS FLOWSHEET:  Blood Flow Rate (mL/min): 400 mL/min Arterial Pressure (mmHg): -170 mmHg Venous Pressure (mmHg): 150 mmHg Transmembrane Pressure (mmHg): 60 mmHg Ultrafiltration Rate (mL/min): 710 mL/min Dialysate Flow Rate (mL/min): 600 ml/min Conductivity: Machine : 13.6 Conductivity: Machine : 13.6 Dialysis Fluid Bolus: Normal Saline Bolus Amount (mL): 250 mL  No complaints at this time Request to have UF increased  Objective:  Vital signs in last 24 hours:  Temp:  [98.6 F (37 C)-99.3 F (37.4 C)] 98.7 F (37.1 C) (05/30 0750) Pulse Rate:  [74-85] 83 (05/30 1300) Resp:  [9-20] 12 (05/30 1300) BP: (113-185)/(69-101) 139/92 (05/30 1300) SpO2:  [96 %-100 %] 97 % (05/30 0750)  Weight change:  Filed Weights   09/23/20 1110 09/27/20 1410  Weight: (!) 140 kg (!) 140 kg    Intake/Output: I/O last 3 completed shifts: In: 90.1 [IV Piggyback:90.1] Out: -    Intake/Output this shift:  No intake/output data recorded.  Physical Exam: General:  No acute distress, laying in bed  Head:  Normocephalic, atraumatic. Moist oral mucosal membranes  Eyes:  Anicteric  Lungs:   Clear to auscultation, normal effort  Heart:  S1S2 no rubs  Abdomen:   Soft, nontender, bowel sounds present  Extremities:  trace peripheral edema.  Neurologic:  Awake, alert, following commands  Skin:  No lesions  Access: Rt IJ Permcath, Lt AVF    Basic Metabolic Panel: Recent Labs  Lab 09/23/20 1212 09/24/20 0433  NA 133* 136  K 4.9 4.7  CL 95* 98  CO2 23 24  GLUCOSE 261* 117*  BUN 44* 53*  CREATININE 14.69* 16.42*  CALCIUM 7.3* 7.3*  PHOS  --  7.9*    Liver Function Tests: Recent Labs  Lab 09/23/20 1212 09/24/20 0433  AST 11*  --   ALT 7  --   ALKPHOS 77  --   BILITOT 0.8  --   PROT 8.1  --   ALBUMIN 3.6 3.2*   No results for input(s): LIPASE, AMYLASE in the last 168 hours. No  results for input(s): AMMONIA in the last 168 hours.  CBC: Recent Labs  Lab 09/23/20 1212 09/24/20 0433  WBC 8.3 8.0  NEUTROABS 6.4  --   HGB 10.0* 9.6*  HCT 32.4* 30.6*  MCV 89.5 90.0  PLT 225 210    Cardiac Enzymes: No results for input(s): CKTOTAL, CKMB, CKMBINDEX, TROPONINI in the last 168 hours.  BNP: Invalid input(s): POCBNP  CBG: Recent Labs  Lab 09/27/20 0823 09/27/20 1411 09/27/20 1726 09/27/20 2314 09/28/20 0842  GLUCAP 160* 129* 238* 139* 154*    Microbiology: Results for orders placed or performed during the hospital encounter of 09/23/20  Aerobic Culture w Gram Stain (superficial specimen)     Status: None (Preliminary result)   Collection Time: 09/23/20  1:25 PM   Specimen: Hemodialysis Fistula  Result Value Ref Range Status   Specimen Description   Final    HEMODIALYSIS FISTULA Performed at [facility], [address], [place], [place] [address]    Special Requests   Final    NONE Performed at [facility], [address] [place], [place] [address]    Gram Stain   Final    ABUNDANT WBC PRESENT, PREDOMINANTLY PMN RARE GRAM POSITIVE COCCI    Culture   Final    NO GROWTH < 12 HOURS Performed at [facility], [place]  [address] [place], [place] [address]    Report Status PENDING  Incomplete  MRSA PCR Screening     Status: None   Collection Time: 09/23/20  9:15 PM   Specimen: Nasopharyngeal  Result Value Ref Range Status   MRSA by PCR NEGATIVE NEGATIVE Final    Comment:        The GeneXpert MRSA Assay (FDA approved for NASAL specimens only), is one component of a comprehensive MRSA colonization surveillance program. It is not intended to diagnose MRSA infection nor to guide or monitor treatment for MRSA infections. Performed at [facility], [address] [place], [place] [address]     Coagulation Studies: No results for input(s): LABPROT, INR in the last 72 hours.  Urinalysis: No results for  input(s): COLORURINE, LABSPEC, PHURINE, GLUCOSEU, HGBUR, BILIRUBINUR, KETONESUR, PROTEINUR, UROBILINOGEN, NITRITE, LEUKOCYTESUR in the last 72 hours.  Invalid input(s): APPERANCEUR    Imaging: DG Shoulder Right  Result Date: 09/23/2020 CLINICAL DATA:  45 year old male with right shoulder pain. EXAM: RIGHT SHOULDER - 2+ VIEW COMPARISON:  None. FINDINGS: The indwelling tunneled hemodialysis catheter overlies the right shoulder on the AP projection. There is no evidence of fracture or dislocation. There is no evidence of arthropathy or other focal bone abnormality. Soft tissues are unremarkable. IMPRESSION: No acute fracture, malalignment, or significant arthropathy. Electronically Signed   By: Ruthann Cancer MD   On: 09/23/2020 12:20     Medications:   . sodium chloride    . sodium chloride    . sodium chloride    . piperacillin-tazobactam (ZOSYN)  IV 2.25 g (09/24/20 0624)  . vancomycin     . aspirin EC  81 mg Oral q1800  . calcium acetate  667 mg Oral TID WC  . carvedilol  12.5 mg Oral BID WC  . Chlorhexidine Gluconate Cloth  6 each Topical Daily  . cinacalcet  60 mg Oral q1800  . heparin  5,000 Units Subcutaneous Q8H  . hydrOXYzine  25 mg Oral QHS  . insulin aspart  0-20 Units Subcutaneous TID WC  . lidocaine-prilocaine   Topical Once  . losartan  50 mg Oral Daily  . pantoprazole  40 mg Oral Daily  . sevelamer carbonate  2.4 g Oral TID WC  . 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   Vascular scheduled to exchange Permcath 09/27/20 Due to late exchange, dialysis scheduled for today UF increased per patient request to 3L Discussed with patient that  permcath will be removed after successful treatments via AVF.    #2 AV fistula ulceration/infection: Patient was given vancomycin yesterday and is on Zosyn.  Appreciate Vascular surgery recs. Lt AVF debridement of access on 09/25/20. Per vascular, AVF can not be used for a few days to recover from procedure. HD Permcath will remain in place and used for HD treatments. Discussed antibiotic therapy with primary team, who recommend 7 days of vancomycin after discharge.  Vancomycin '1000mg'$  MWF with dialysis for 7 days  #3 congestive heart failure  Monitoring fluid levels and can manage with HD, if needed    LOS: [place], MD [facility] 6/3/20229:54 AM

## 2020-10-09 ENCOUNTER — Other Ambulatory Visit: Payer: Self-pay

## 2020-10-09 ENCOUNTER — Encounter (INDEPENDENT_AMBULATORY_CARE_PROVIDER_SITE_OTHER): Payer: Self-pay | Admitting: Nurse Practitioner

## 2020-10-09 ENCOUNTER — Ambulatory Visit (INDEPENDENT_AMBULATORY_CARE_PROVIDER_SITE_OTHER): Payer: Medicare Other | Admitting: Nurse Practitioner

## 2020-10-09 VITALS — BP 172/91 | HR 83 | Ht 72.0 in | Wt 276.0 lb

## 2020-10-09 DIAGNOSIS — N186 End stage renal disease: Secondary | ICD-10-CM

## 2020-10-09 DIAGNOSIS — E1122 Type 2 diabetes mellitus with diabetic chronic kidney disease: Secondary | ICD-10-CM

## 2020-10-09 DIAGNOSIS — I10 Essential (primary) hypertension: Secondary | ICD-10-CM

## 2020-10-09 NOTE — H&P (View-Only) (Signed)
Subjective:    Patient ID: [name] [name], male    DOB: 01-Jan-1976, 45 y.o.   MRN: [id number] Chief Complaint  Patient presents with   Follow-up    1 wk post [facility] -in week first post op incision  check     The patient presents today for wound evaluation.  He underwent intervention on 09/25/2020 including:  PROCEDURE: Excision with debridement of superficial cutaneous ulcer left arm  The wound today is clean dry and intact.  There are some scabbing over the area but it appears essentially healed at this time.  There appears to be 1 suture in place.  No drainage or foul-smelling odors.  The patient unfortunately oozing from the area.  Overall he is done well postintervention.   Review of Systems  Skin:  Positive for wound.      Objective:   Physical Exam Vitals reviewed.  HENT:     Head: Normocephalic.  Cardiovascular:     Rate and Rhythm: Normal rate.     Pulses:          Radial pulses are 2+ on the left side.     Arteriovenous access: Left arteriovenous access is present.    Comments: Good thrill and bruit.  Scabbed over surgical site Pulmonary:     Effort: Pulmonary effort is normal.  Skin:    General: Skin is warm and dry.  Neurological:     Mental Status: He is alert and oriented to person, place, and time.  Psychiatric:        Mood and Affect: Mood normal.        Behavior: Behavior normal.        Thought Content: Thought content normal.        Judgment: Judgment normal.    BP (!) 172/91   Pulse 83   Ht 6' (1.829 m)   Wt 276 lb (125.2 kg)   BMI 37.43 kg/m   Past Medical History:  Diagnosis Date   CHF (congestive heart failure) (HCC)    Coronary artery disease    Diabetes mellitus without complication ([place])    Dialysis patient [facility])    M-W-F   GERD (gastroesophageal reflux disease)    Hypertension    Kyrle's disease    Renal disorder    on dialysis for 4 years three times a week last was Tuesday   Secondary hyperparathyroidism of renal origin ([place])     Shortness of breath dyspnea    Type 2 diabetes mellitus with diabetic nephropathy (HCC)    Type 2 diabetes mellitus with diabetic retinopathy (HCC)     Social History   Socioeconomic History   Marital status: Single    Spouse name: Not on file   Number of children: Not on file   Years of education: Not on file   Highest education level: Not on file  Occupational History   Not on file  Tobacco Use   Smoking status: Never   Smokeless tobacco: Never  Vaping Use   Vaping Use: Never used  Substance and Sexual Activity   Alcohol use: Not Currently    Comment: occ. shot   Drug use: No   Sexual activity: Not on file  Other Topics Concern   Not on file  Social History Narrative   Lives by himself   Social Determinants of Health   Financial Resource Strain: Not on file  Food Insecurity: Not on file  Transportation Needs: Not on file  Physical Activity: Not on file  Stress: Not on file  Social Connections: Not on file  Intimate Partner Violence: Not on file    Past Surgical History:  Procedure Laterality Date   A/V FISTULAGRAM Left 10/01/2017   Procedure: A/V FISTULAGRAM;  Surgeon: Algernon Huxley, MD;  Location: [place] [facility];  Service: Cardiovascular;  Laterality: Left;   A/V FISTULAGRAM Left 05/17/2020   Procedure: A/V FISTULAGRAM;  Surgeon: Algernon Huxley, MD;  Location: [place] [facility];  Service: Cardiovascular;  Laterality: Left;   CATARACT EXTRACTION W/PHACO Right 06/30/2018   Procedure: CATARACT EXTRACTION PHACO AND INTRAOCULAR LENS PLACEMENT (IOC)-RIGHT;  Surgeon: Eulogio Bear, MD;  Location: [facility] ORS;  Service: Ophthalmology;  Laterality: Right;  [place] 00:07.0 CDE 0.16 FLUID PACK lOT # [id number]   CATARACT EXTRACTION W/PHACO Left 12/24/2018   Procedure: CATARACT EXTRACTION PHACO AND INTRAOCULAR LENS PLACEMENT (IOC);  Surgeon: Eulogio Bear, MD;  Location: [facility] ORS;  Service: Ophthalmology;  Laterality: Left;  [place] 00:30.0 CDE 2.01 FLUID PACK LOT #  [id number]   COLONOSCOPY WITH PROPOFOL N/A 04/23/2017   Procedure: COLONOSCOPY WITH PROPOFOL;  Surgeon: [name], MD;  Location: [facility] ENDOSCOPY;  Service: Endoscopy;  Laterality: N/A;   COLONOSCOPY WITH PROPOFOL N/A 07/02/2017   Procedure: COLONOSCOPY WITH PROPOFOL;  Surgeon: [name], MD;  Location: [facility] ENDOSCOPY;  Service: Endoscopy;  Laterality: N/A;   DIALYSIS/PERMA CATHETER INSERTION N/A 09/27/2020   Procedure: DIALYSIS/PERMA CATHETER INSERTION;  Surgeon: Algernon Huxley, MD;  Location: [place] [facility];  Service: Cardiovascular;  Laterality: N/A;   ESOPHAGOGASTRODUODENOSCOPY (EGD) WITH PROPOFOL N/A 04/23/2017   Procedure: ESOPHAGOGASTRODUODENOSCOPY (EGD) WITH PROPOFOL;  Surgeon: [name], MD;  Location: [facility] ENDOSCOPY;  Service: Endoscopy;  Laterality: N/A;   ESOPHAGOGASTRODUODENOSCOPY (EGD) WITH PROPOFOL N/A 03/27/2020   Procedure: ESOPHAGOGASTRODUODENOSCOPY (EGD) WITH PROPOFOL;  Surgeon: Lesly Rubenstein, MD;  Location: [facility] ENDOSCOPY;  Service: Endoscopy;  Laterality: N/A;   EYE SURGERY     RETINA   INSERTION OF DIALYSIS CATHETER Right 08/09/2020   Procedure: INSERTION OF DIALYSIS CATHETER;  Surgeon: Algernon Huxley, MD;  Location: [facility] ORS;  Service: Vascular;  Laterality: Right;   PERIPHERAL VASCULAR CATHETERIZATION N/A 05/07/2015   Procedure: A/V Shuntogram/Fistulagram;  Surgeon: Katha Cabal, MD;  Location: [place] [facility];  Service: Cardiovascular;  Laterality: N/A;   PERIPHERAL VASCULAR CATHETERIZATION Left 05/07/2015   Procedure: A/V Shunt Intervention;  Surgeon: Katha Cabal, MD;  Location: [place] [facility];  Service: Cardiovascular;  Laterality: Left;   PERIPHERAL VASCULAR THROMBECTOMY Left 07/16/2020   Procedure: PERIPHERAL VASCULAR THROMBECTOMY;  Surgeon: Algernon Huxley, MD;  Location: [place] [facility];  Service: Cardiovascular;  Laterality: Left;   PERIPHERAL VASCULAR THROMBECTOMY Left 08/27/2020   Procedure: PERIPHERAL VASCULAR  THROMBECTOMY;  Surgeon: Algernon Huxley, MD;  Location: [place] [facility];  Service: Cardiovascular;  Laterality: Left;   REVISON OF ARTERIOVENOUS FISTULA Left 08/09/2020   Procedure: REVISON OF ARTERIOVENOUS FISTULA;  Surgeon: Algernon Huxley, MD;  Location: [facility] OR[facility];  Service: Vascular;  Laterality: Left;   TEE WITHOUT CARDIOVERSION N/A 07/05/2015   Procedure: TRANSESOPHAGEAL ECHOCARDIOGRAM (TEE);  Surgeon: Dionisio David, MD;  Location: [facility] OR[facility];  Service: Cardiovascular;  Laterality: N/A;   UPPER EXTREMITY ANGIOGRAPHY Left 06/24/2018   Procedure: UPPER EXTREMITY ANGIOGRAPHY;  Surgeon: Algernon Huxley, MD;  Location: [place] [facility];  Service: Cardiovascular;  Laterality: Left;   WOUND DEBRIDEMENT N/A 09/25/2020   Procedure: DEBRIDEMENT WOUND;  Surgeon: Katha Cabal, MD;  Location: [facility];  Service: Vascular;  Laterality: N/A;    Family History  Problem Relation Age of Onset   Diabetes Father     Allergies  Allergen Reactions   Cephalexin Nausea Only   Ivp Dye [Iodinated Diagnostic Agents] Diarrhea   Lisinopril Other (See Comments)    Pt cannot recall reaction, cough   Other     CBC Latest Ref Rng & Units 09/28/2020 09/24/2020 09/23/2020  WBC 4.0 - 10.5 K/uL 7.3 8.0 8.3  Hemoglobin 13.0 - 17.0 g/dL 9.9(L) 9.6(L) 10.0(L)  Hematocrit 39.0 - 52.0 % 31.1(L) 30.6(L) 32.4(L)  Platelets 150 - 400 K/uL 193 210 225      CMP     Component Value Date/Time   NA 135 09/28/2020 1226   NA 138 02/24/2014 0359   K 3.8 09/28/2020 1226   K 3.5 02/24/2014 0359   CL 96 (L) 09/28/2020 1226   CL 97 (L) 02/24/2014 0359   CO2 25 09/28/2020 1226   CO2 31 02/24/2014 0359   GLUCOSE 145 (H) 09/28/2020 1226   GLUCOSE 231 (H) 02/24/2014 0359   BUN 32 (H) 09/28/2020 1226   BUN 38 (H) 02/24/2014 0359   CREATININE 9.78 (H) 09/28/2020 1226   CREATININE 12.04 (H) 02/24/2014 0359   CALCIUM 8.1 (L) 09/28/2020 1226   CALCIUM 7.9 (L) 02/24/2014 0359   PROT 8.1 09/23/2020 1212   PROT 7.3 02/22/2014  1526   ALBUMIN 3.2 (L) 09/24/2020 0433   ALBUMIN 3.2 (L) 02/22/2014 1526   AST 11 (L) 09/23/2020 1212   AST 27 02/22/2014 1526   ALT 7 09/23/2020 1212   ALT 17 02/22/2014 1526   ALKPHOS 77 09/23/2020 1212   ALKPHOS 73 02/22/2014 1526   BILITOT 0.8 09/23/2020 1212   BILITOT 0.8 02/22/2014 1526   GFRNONAA 6 (L) 09/28/2020 1226   GFRNONAA 5 (L) 02/24/2014 0359   GFRAA 3 (L) 05/07/2015 0937   GFRAA 6 (L) 02/24/2014 0359     No results found.     Assessment & Plan:   1. End stage renal disease ([place]) Patient tolerated suture removal well.  Patient has gotten vancomycin at his dialysis center.  Patient return to the office in 6 months for noninvasive studies related to his dialysis access or sooner if issues arise.  2. Primary hypertension Continue antihypertensive medications as already ordered, these medications have been reviewed and there are no changes at this time.   3. Diabetes mellitus with ESRD (end-stage renal disease) ([place]) Continue hypoglycemic medications as already ordered, these medications have been reviewed and there are no changes at this time.  Hgb A1C to be monitored as already arranged by primary service    Current Outpatient Medications on File Prior to Visit  Medication Sig Dispense Refill   aspirin EC 81 MG tablet Take 81 mg by mouth daily at 6 PM. (1700)     Beta Carotene (VITAMIN A) 25000 UNIT capsule Take 25,000 Units by mouth daily.     calcium acetate (PHOSLO) 667 MG capsule Take by mouth in the morning and at bedtime. Takes in AM     carvedilol (COREG) 12.5 MG tablet Take 12.5 mg by mouth 2 (two) times daily with a meal.      clindamycin (CLINDAGEL) 1 % gel Apply topically.     HUMULIN 70/30 (70-30) 100 UNIT/ML injection Inject 10-30 Units into the skin See admin instructions. 10 units before breakfast and 30 units before supper ON DIALYSIS DAYS (M-W-F), NO AM INSULIN     hydrOXYzine (  ATARAX/VISTARIL) 25 MG tablet Take 25 mg by mouth at bedtime.      lidocaine-prilocaine (EMLA) cream Apply topically.     loperamide (IMODIUM) 2 MG capsule Take 2 mg by mouth as needed for diarrhea or loose stools (Take 1/2-1 tablet as meeded to prevent diarrhea. Can take up to 1 tablet every 4 hours. Do not exceed 4 tablets in a day.).     losartan (COZAAR) 50 MG tablet Take 50 mg by mouth daily. (1700)     mupirocin ointment (BACTROBAN) 2 % Apply 1 application topically 2 (two) times daily. 22 g 1   omeprazole (PRILOSEC) 20 MG capsule Take 20 mg by mouth daily.     oxyCODONE-acetaminophen (PERCOCET/ROXICET) 5-325 MG tablet Take 1 tablet by mouth every 6 (six) hours as needed for severe pain. 30 tablet 0   SENSIPAR 60 MG tablet Take 60 mg by mouth daily at 6 PM. 1700  3   sevelamer carbonate (RENVELA) 2.4 g PACK      vitamin E 1000 UNIT capsule Take 1,000 Units by mouth daily. Not sure of dose; started 4/29     No current facility-administered medications on file prior to visit.    There are no Patient Instructions on file for this visit. No follow-ups on file.   Kris Hartmann, NP

## 2020-10-09 NOTE — Progress Notes (Signed)
Subjective:    Patient ID: [name] [name], male    DOB: [birth year], 45 y.o.   MRN: [id number] Chief Complaint  Patient presents with   Follow-up    1 wk post [facility] -in week first post op incision  check     The patient presents today for wound evaluation.  He underwent intervention on 09/25/2020 including:  PROCEDURE: Excision with debridement of superficial cutaneous ulcer left arm  The wound today is clean dry and intact.  There are some scabbing over the area but it appears essentially healed at this time.  There appears to be 1 suture in place.  No drainage or foul-smelling odors.  The patient unfortunately oozing from the area.  Overall he is done well postintervention.   Review of Systems  Skin:  Positive for wound.      Objective:   Physical Exam Vitals reviewed.  HENT:     Head: Normocephalic.  Cardiovascular:     Rate and Rhythm: Normal rate.     Pulses:          Radial pulses are 2+ on the left side.     Arteriovenous access: Left arteriovenous access is present.    Comments: Good thrill and bruit.  Scabbed over surgical site Pulmonary:     Effort: Pulmonary effort is normal.  Skin:    General: Skin is warm and dry.  Neurological:     Mental Status: He is alert and oriented to person, place, and time.  Psychiatric:        Mood and Affect: Mood normal.        Behavior: Behavior normal.        Thought Content: Thought content normal.        Judgment: Judgment normal.    BP (!) 172/91   Pulse 83   Ht 6' (1.829 m)   Wt 276 lb (125.2 kg)   BMI 37.43 kg/m   Past Medical History:  Diagnosis Date   CHF (congestive heart failure) (HCC)    Coronary artery disease    Diabetes mellitus without complication ([place])    Dialysis patient [facility])    M-W-F   GERD (gastroesophageal reflux disease)    Hypertension    Kyrle's disease    Renal disorder    on dialysis for 4 years three times a week last was Tuesday   Secondary hyperparathyroidism of renal origin ([place])     Shortness of breath dyspnea    Type 2 diabetes mellitus with diabetic nephropathy (HCC)    Type 2 diabetes mellitus with diabetic retinopathy (HCC)     Social History   Socioeconomic History   Marital status: Single    Spouse name: Not on file   Number of children: Not on file   Years of education: Not on file   Highest education level: Not on file  Occupational History   Not on file  Tobacco Use   Smoking status: Never   Smokeless tobacco: Never  Vaping Use   Vaping Use: Never used  Substance and Sexual Activity   Alcohol use: Not Currently    Comment: occ. shot   Drug use: No   Sexual activity: Not on file  Other Topics Concern   Not on file  Social History Narrative   Lives by himself   Social Determinants of Health   Financial Resource Strain: Not on file  Food Insecurity: Not on file  Transportation Needs: Not on file  Physical Activity: Not on file  Stress: Not on file  Social Connections: Not on file  Intimate Partner Violence: Not on file    Past Surgical History:  Procedure Laterality Date   A/V FISTULAGRAM Left 10/01/2017   Procedure: A/V FISTULAGRAM;  Surgeon: Algernon Huxley, MD;  Location: [place] [facility];  Service: Cardiovascular;  Laterality: Left;   A/V FISTULAGRAM Left 05/17/2020   Procedure: A/V FISTULAGRAM;  Surgeon: Algernon Huxley, MD;  Location: [place] [facility];  Service: Cardiovascular;  Laterality: Left;   CATARACT EXTRACTION W/PHACO Right 06/30/2018   Procedure: CATARACT EXTRACTION PHACO AND INTRAOCULAR LENS PLACEMENT (IOC)-RIGHT;  Surgeon: Eulogio Bear, MD;  Location: [facility];  Service: Ophthalmology;  Laterality: Right;  [place] 00:07.0 CDE 0.16 FLUID PACK lOT # [id number]   CATARACT EXTRACTION W/PHACO Left 12/24/2018   Procedure: CATARACT EXTRACTION PHACO AND INTRAOCULAR LENS PLACEMENT (IOC);  Surgeon: Eulogio Bear, MD;  Location: [facility];  Service: Ophthalmology;  Laterality: Left;  [place] 00:30.0 CDE 2.01 FLUID PACK LOT #  [id number]   COLONOSCOPY WITH PROPOFOL N/A 04/23/2017   Procedure: COLONOSCOPY WITH PROPOFOL;  Surgeon: [name], MD;  Location: [facility] ENDOSCOPY;  Service: Endoscopy;  Laterality: N/A;   COLONOSCOPY WITH PROPOFOL N/A 07/02/2017   Procedure: COLONOSCOPY WITH PROPOFOL;  Surgeon: [name], MD;  Location: [facility] ENDOSCOPY;  Service: Endoscopy;  Laterality: N/A;   DIALYSIS/PERMA CATHETER INSERTION N/A 09/27/2020   Procedure: DIALYSIS/PERMA CATHETER INSERTION;  Surgeon: Algernon Huxley, MD;  Location: [place] [facility];  Service: Cardiovascular;  Laterality: N/A;   ESOPHAGOGASTRODUODENOSCOPY (EGD) WITH PROPOFOL N/A 04/23/2017   Procedure: ESOPHAGOGASTRODUODENOSCOPY (EGD) WITH PROPOFOL;  Surgeon: [name], MD;  Location: [facility] ENDOSCOPY;  Service: Endoscopy;  Laterality: N/A;   ESOPHAGOGASTRODUODENOSCOPY (EGD) WITH PROPOFOL N/A 03/27/2020   Procedure: ESOPHAGOGASTRODUODENOSCOPY (EGD) WITH PROPOFOL;  Surgeon: Lesly Rubenstein, MD;  Location: [facility] ENDOSCOPY;  Service: Endoscopy;  Laterality: N/A;   EYE SURGERY     RETINA   INSERTION OF DIALYSIS CATHETER Right 08/09/2020   Procedure: INSERTION OF DIALYSIS CATHETER;  Surgeon: Algernon Huxley, MD;  Location: [facility] ORS;  Service: Vascular;  Laterality: Right;   PERIPHERAL VASCULAR CATHETERIZATION N/A 05/07/2015   Procedure: A/V Shuntogram/Fistulagram;  Surgeon: Katha Cabal, MD;  Location: [place] [facility];  Service: Cardiovascular;  Laterality: N/A;   PERIPHERAL VASCULAR CATHETERIZATION Left 05/07/2015   Procedure: A/V Shunt Intervention;  Surgeon: Katha Cabal, MD;  Location: [place] [facility];  Service: Cardiovascular;  Laterality: Left;   PERIPHERAL VASCULAR THROMBECTOMY Left 07/16/2020   Procedure: PERIPHERAL VASCULAR THROMBECTOMY;  Surgeon: Algernon Huxley, MD;  Location: [place] [facility];  Service: Cardiovascular;  Laterality: Left;   PERIPHERAL VASCULAR THROMBECTOMY Left 08/27/2020   Procedure: PERIPHERAL VASCULAR  THROMBECTOMY;  Surgeon: Algernon Huxley, MD;  Location: [place] [facility];  Service: Cardiovascular;  Laterality: Left;   REVISON OF ARTERIOVENOUS FISTULA Left 08/09/2020   Procedure: REVISON OF ARTERIOVENOUS FISTULA;  Surgeon: Algernon Huxley, MD;  Location: [facility];  Service: Vascular;  Laterality: Left;   TEE WITHOUT CARDIOVERSION N/A 07/05/2015   Procedure: TRANSESOPHAGEAL ECHOCARDIOGRAM (TEE);  Surgeon: Dionisio David, MD;  Location: [facility];  Service: Cardiovascular;  Laterality: N/A;   UPPER EXTREMITY ANGIOGRAPHY Left 06/24/2018   Procedure: UPPER EXTREMITY ANGIOGRAPHY;  Surgeon: Algernon Huxley, MD;  Location: [place] [facility];  Service: Cardiovascular;  Laterality: Left;   WOUND DEBRIDEMENT N/A 09/25/2020   Procedure: DEBRIDEMENT WOUND;  Surgeon: Katha Cabal, MD;  Location: [facility]  ORS;  Service: Vascular;  Laterality: N/A;    Family History  Problem Relation Age of Onset   Diabetes Father     Allergies  Allergen Reactions   Cephalexin Nausea Only   Ivp Dye [Iodinated Diagnostic Agents] Diarrhea   Lisinopril Other (See Comments)    Pt cannot recall reaction, cough   Other     CBC Latest Ref Rng & Units 09/28/2020 09/24/2020 09/23/2020  WBC 4.0 - 10.5 K/uL 7.3 8.0 8.3  Hemoglobin 13.0 - 17.0 g/dL 9.9(L) 9.6(L) 10.0(L)  Hematocrit 39.0 - 52.0 % 31.1(L) 30.6(L) 32.4(L)  Platelets 150 - 400 K/uL 193 210 225      CMP     Component Value Date/Time   NA 135 09/28/2020 1226   NA 138 02/24/2014 0359   K 3.8 09/28/2020 1226   K 3.5 02/24/2014 0359   CL 96 (L) 09/28/2020 1226   CL 97 (L) 02/24/2014 0359   CO2 25 09/28/2020 1226   CO2 31 02/24/2014 0359   GLUCOSE 145 (H) 09/28/2020 1226   GLUCOSE 231 (H) 02/24/2014 0359   BUN 32 (H) 09/28/2020 1226   BUN 38 (H) 02/24/2014 0359   CREATININE 9.78 (H) 09/28/2020 1226   CREATININE 12.04 (H) 02/24/2014 0359   CALCIUM 8.1 (L) 09/28/2020 1226   CALCIUM 7.9 (L) 02/24/2014 0359   PROT 8.1 09/23/2020 1212   PROT 7.3 02/22/2014  1526   ALBUMIN 3.2 (L) 09/24/2020 0433   ALBUMIN 3.2 (L) 02/22/2014 1526   AST 11 (L) 09/23/2020 1212   AST 27 02/22/2014 1526   ALT 7 09/23/2020 1212   ALT 17 02/22/2014 1526   ALKPHOS 77 09/23/2020 1212   ALKPHOS 73 02/22/2014 1526   BILITOT 0.8 09/23/2020 1212   BILITOT 0.8 02/22/2014 1526   GFRNONAA 6 (L) 09/28/2020 1226   GFRNONAA 5 (L) 02/24/2014 0359   GFRAA 3 (L) 05/07/2015 0937   GFRAA 6 (L) 02/24/2014 0359     No results found.     Assessment & Plan:   1. End stage renal disease ([place]) Patient tolerated suture removal well.  Patient has gotten vancomycin at his dialysis center.  Patient return to the office in 6 months for noninvasive studies related to his dialysis access or sooner if issues arise.  2. Primary hypertension Continue antihypertensive medications as already ordered, these medications have been reviewed and there are no changes at this time.   3. Diabetes mellitus with ESRD (end-stage renal disease) ([place]) Continue hypoglycemic medications as already ordered, these medications have been reviewed and there are no changes at this time.  Hgb A1C to be monitored as already arranged by primary service    Current Outpatient Medications on File Prior to Visit  Medication Sig Dispense Refill   aspirin EC 81 MG tablet Take 81 mg by mouth daily at 6 PM. (1700)     Beta Carotene (VITAMIN A) 25000 UNIT capsule Take 25,000 Units by mouth daily.     calcium acetate (PHOSLO) 667 MG capsule Take by mouth in the morning and at bedtime. Takes in AM     carvedilol (COREG) 12.5 MG tablet Take 12.5 mg by mouth 2 (two) times daily with a meal.      clindamycin (CLINDAGEL) 1 % gel Apply topically.     HUMULIN 70/30 (70-30) 100 UNIT/ML injection Inject 10-30 Units into the skin See admin instructions. 10 units before breakfast and 30 units before supper ON DIALYSIS DAYS (M-W-F), NO AM INSULIN     hydrOXYzine (  ATARAX/VISTARIL) 25 MG tablet Take 25 mg by mouth at bedtime.      lidocaine-prilocaine (EMLA) cream Apply topically.     loperamide (IMODIUM) 2 MG capsule Take 2 mg by mouth as needed for diarrhea or loose stools (Take 1/2-1 tablet as meeded to prevent diarrhea. Can take up to 1 tablet every 4 hours. Do not exceed 4 tablets in a day.).     losartan (COZAAR) 50 MG tablet Take 50 mg by mouth daily. (1700)     mupirocin ointment (BACTROBAN) 2 % Apply 1 application topically 2 (two) times daily. 22 g 1   omeprazole (PRILOSEC) 20 MG capsule Take 20 mg by mouth daily.     oxyCODONE-acetaminophen (PERCOCET/ROXICET) 5-325 MG tablet Take 1 tablet by mouth every 6 (six) hours as needed for severe pain. 30 tablet 0   SENSIPAR 60 MG tablet Take 60 mg by mouth daily at 6 PM. 1700  3   sevelamer carbonate (RENVELA) 2.4 g PACK      vitamin E 1000 UNIT capsule Take 1,000 Units by mouth daily. Not sure of dose; started 4/29     No current facility-administered medications on file prior to visit.    There are no Patient Instructions on file for this visit. No follow-ups on file.   Kris Hartmann, NP

## 2020-10-11 ENCOUNTER — Encounter: Payer: Medicare Other | Attending: Nurse Practitioner | Admitting: Dietician

## 2020-10-11 ENCOUNTER — Encounter: Payer: Self-pay | Admitting: Dietician

## 2020-10-11 ENCOUNTER — Other Ambulatory Visit: Payer: Self-pay

## 2020-10-11 VITALS — BP 120/70 | Ht 73.0 in | Wt 275.7 lb

## 2020-10-11 DIAGNOSIS — Z992 Dependence on renal dialysis: Secondary | ICD-10-CM | POA: Insufficient documentation

## 2020-10-11 DIAGNOSIS — E1122 Type 2 diabetes mellitus with diabetic chronic kidney disease: Secondary | ICD-10-CM | POA: Insufficient documentation

## 2020-10-11 DIAGNOSIS — N186 End stage renal disease: Secondary | ICD-10-CM | POA: Diagnosis present

## 2020-10-11 DIAGNOSIS — Z794 Long term (current) use of insulin: Secondary | ICD-10-CM | POA: Insufficient documentation

## 2020-10-11 NOTE — Patient Instructions (Signed)
Keep blood sugars steady better during the day by including a protein food + starch + veg or fruit with each meal.  Having close to the same amount of carbohydrate with each meal prevents some high and low blood sugars. Have a healthy snack on hand especially when exercising: crackers with a small amount of peanut butter, fruit and a small portion of nuts (if OK with kidney dietitian), boiled egg with fruit or unsalted crackers; low sodium tuna or [place] on bread.  Use a small cooler or insulated bag when travelling.

## 2020-10-11 NOTE — Progress Notes (Signed)
Diabetes Self-Management Education  Visit Type:  Follow-up  Appt. Start Time: 1500 Appt. End Time: 1600  10/11/2020  Mr. Denzel Wilcoxson, identified by name and date of birth, is a 45 y.o. male with a diagnosis of Diabetes:  .   ASSESSMENT  Blood pressure 120/70, height '6\' 1"'$  (1.854 m), weight 275 lb 11.2 oz (125.1 kg). Body mass index is 36.37 kg/m.    Diabetes Self-Management Education - [id number]       Complications   How often do you check your blood sugar? 0 times/day (not testing)   issues with sensor staying attached; recent hospitalization; will resume testing soon   Number of hypoglycemic episodes per month 1    Can you tell when your blood sugar is low? Yes    What do you do if your blood sugar is low? ate ice cream, drank soda    Have you had a dilated eye exam in the past 12 months? No    Have you had a dental exam in the past 12 months? No    Are you checking your feet? Yes    How many days per week are you checking your feet? 7      Dietary Intake   Breakfast eggs occ with sausage, limits potatoes, does eat fruit, sometimes grits    Snack (morning) none    Lunch protein food and/or veg; ate homemade fries once due to craving; sometimes sub from [employer]    Snack (afternoon) none    Dinner leftovers from lunch    Snack (evening) only if sugara is low -- honey/ candy/ honey bun    Beverage(s) water; sometimes sugar free flavoring in water; Mt Dew if sugar is low      Exercise   Exercise Type ADL's   currently limiting to protect dressing from dialysis catheter from sweating.     Patient Education   Disease state  Other (comment)   importance of proactive management of blood sugars vs correcting high/ low BGs   Nutrition management  Role of diet in the treatment of diabetes and the relationship between the three main macronutrients and blood glucose level;Reviewed blood glucose goals for pre and post meals and how to evaluate the patients' food intake on their  blood glucose level.;Meal options for control of blood glucose level and chronic complications.;Other (comment)   importance of consistent carb intake; discussed balanced meal and snack options   Physical activity and exercise  Role of exercise on diabetes management, blood pressure control and cardiac health.    Medications Reviewed patients medication for diabetes, action, purpose, timing of dose and side effects.    Acute complications Other (comment)   options for appropriate hypoglycemia treatments     Post-Education Assessment   Patient understands the diabetes disease and treatment process. Demonstrates understanding / competency    Patient understands incorporating nutritional management into lifestyle. Demonstrates understanding / competency    Patient undertands incorporating physical activity into lifestyle. Demonstrates understanding / competency    Patient understands using medications safely. Demonstrates understanding / competency    Patient understands monitoring blood glucose, interpreting and using results Demonstrates understanding / competency    Patient understands prevention, detection, and treatment of acute complications. Demonstrates understanding / competency    Patient understands prevention, detection, and treatment of chronic complications. Demonstrates understanding / competency    Patient understands how to develop strategies to address psychosocial issues. Needs Review    Patient understands how to develop strategies to promote  health/change behavior. Needs Review      Outcomes   Program Status Completed             Learning Objective:  Patient will have a greater understanding of diabetes self-management. Patient education plan is to attend individual and/or group sessions per assessed needs and concerns.  Additional Notes: Patient reports often eating one food at a time for meals prepared at home, but might eat a large portion to satisfy hunger.  He  reports eating large portions of fruits when dealing with hypoglycemic episodes.  He has been having some nighttime hypoglycemia.  He does see renal RD during dialysis visits; recent potassium and phosphorus results have been elevated (during hospitalization)   Plan:   Patient Instructions  Keep blood sugars steady better during the day by including a protein food + starch + veg or fruit with each meal.  Having close to the same amount of carbohydrate with each meal prevents some high and low blood sugars. Have a healthy snack on hand especially when exercising: crackers with a small amount of peanut butter, fruit and a small portion of nuts (if OK with kidney dietitian), boiled egg with fruit or unsalted crackers; low sodium tuna or [place] on bread.  Use a small cooler or insulated bag when travelling.     Expected Outcomes:  Demonstrated interest in learning. Expect positive outcomes  Education material provided: Food Pyramid for Kidney Disease; visit summary with goals/ instructions  If problems or questions, patient to contact team via:  Phone and Email  Future DSME appointment: -  patient has completed 2-visit refresher course.

## 2020-10-25 ENCOUNTER — Telehealth (INDEPENDENT_AMBULATORY_CARE_PROVIDER_SITE_OTHER): Payer: Self-pay

## 2020-10-25 NOTE — Telephone Encounter (Signed)
Spoke with the patient and he is scheduled with Dr. Lucky Cowboy for a permcath removal on 11/01/20 with a 10:15 am arrival time to the [facility]. Pre-procedure instructions were discussed and will be mailed.

## 2020-10-31 ENCOUNTER — Other Ambulatory Visit (INDEPENDENT_AMBULATORY_CARE_PROVIDER_SITE_OTHER): Payer: Self-pay | Admitting: Nurse Practitioner

## 2020-11-01 ENCOUNTER — Encounter: Payer: Self-pay | Admitting: Vascular Surgery

## 2020-11-01 ENCOUNTER — Encounter
Admission: RE | Disposition: A | Discharge status: Home / Self Care | Payer: Self-pay | Source: Home / Self Care | Attending: Vascular Surgery

## 2020-11-01 ENCOUNTER — Ambulatory Visit
Admission: RE | Admit: 2020-11-01 | Discharge: 2020-11-01 | Disposition: A | Discharge status: Home / Self Care | Payer: Medicare Other | Attending: Vascular Surgery | Admitting: Vascular Surgery

## 2020-11-01 DIAGNOSIS — N186 End stage renal disease: Secondary | ICD-10-CM

## 2020-11-01 DIAGNOSIS — Z4901 Encounter for fitting and adjustment of extracorporeal dialysis catheter: Secondary | ICD-10-CM | POA: Diagnosis not present

## 2020-11-01 DIAGNOSIS — Z452 Encounter for adjustment and management of vascular access device: Secondary | ICD-10-CM | POA: Diagnosis not present

## 2020-11-01 HISTORY — PX: DIALYSIS/PERMA CATHETER REMOVAL: CATH118289

## 2020-11-01 LAB — GLUCOSE, CAPILLARY: Glucose-Capillary: 165 mg/dL — ABNORMAL HIGH (ref 70–99)

## 2020-11-01 SURGERY — DIALYSIS/PERMA CATHETER REMOVAL
Anesthesia: LOCAL

## 2020-11-01 MED ORDER — LIDOCAINE-EPINEPHRINE (PF) 1 %-1:200000 IJ SOLN
INTRAMUSCULAR | Status: DC | PRN
  Administered 2020-11-01: 20 mL via INTRADERMAL

## 2020-11-01 SURGICAL SUPPLY — 3 items
CHLORAPREP W/TINT 26 (MISCELLANEOUS) ×4 IMPLANT
FORCEPS HALSTEAD CVD 5IN STRL (INSTRUMENTS) ×2 IMPLANT
TRAY LACERAT/PLASTIC (MISCELLANEOUS) ×2 IMPLANT

## 2020-11-01 NOTE — Op Note (Signed)
Operative Note     Preoperative diagnosis:   1. ESRD with functional permanent access  Postoperative diagnosis:  1. ESRD with functional permanent access  Procedure:  Removal of right jugular Permcath  Surgeon:  [name], MD  Anesthesia:  Local  EBL:  Minimal  Indication for the Procedure:  The patient has a functional permanent dialysis access and no longer needs their permcath.  This can be removed.  Risks and benefits are discussed and informed consent is obtained.  Description of the Procedure:  The patient's right neck, chest and existing catheter were sterilely prepped and draped. The area around the catheter was anesthetized copiously with 1% lidocaine. The catheter was dissected out with curved hemostats until the cuff was freed from the surrounding fibrous sheath. The fiber sheath was transected, and the catheter was then removed in its entirety using gentle traction. Pressure was held and sterile dressings were placed. The patient tolerated the procedure well and was taken to the recovery room in stable condition.     [name]  11/01/2020, 11:11 AM This note was created with Dragon Medical transcription system. Any errors in dictation are purely unintentional.

## 2020-11-01 NOTE — Discharge Instructions (Signed)
Tunneled Catheter Removal, Care After Refer to this sheet in the next few weeks. These instructions provide you with information about caring for yourself after your procedure. Your health care provider may also give you more specific instructions. Your treatment has been planned according to current medical practices, but problems sometimes occur. Call your health care provider if you have any problems or questions after your procedure. What can I expect after the procedure? After the procedure, it is common to have: Some mild redness, swelling, and pain around your catheter site.   Follow these instructions at home: Incision care  Check your removal site  every day for signs of infection. Check for: More redness, swelling, or pain. More fluid or blood. Warmth. Pus or a bad smell. Remove your dressing in 48hrs leave open to air  Activity  Return to your normal activities as told by your health care provider. Ask your health care provider what activities are safe for you. Do not lift anything that is heavier than 10 lb (4.5 kg) for 3 days  You may shower tomorrow  Contact a health care provider if: You have more fluid or blood coming from your removal site You have more redness, swelling, or pain at your incisions or around the area where your catheter was removed Your removal site feel warm to the touch. You feel unusually weak. You feel nauseous.. Get help right away if You have swelling in your arm, shoulder, neck, or face. You develop chest pain. You have difficulty breathing. You feel dizzy or light-headed. You have pus or a bad smell coming from your removal site You have a fever. You develop bleeding from your removal site, and your bleeding does not stop. This information is not intended to replace advice given to you by your health care provider. Make sure you discuss any questions you have with your health care provider. Document Released: 03/31/2012 Document Revised:  12/16/2015 Document Reviewed: 01/08/2015 Elsevier Interactive Patient Education  2017 [employer] 

## 2020-11-01 NOTE — Interval H&P Note (Signed)
History and Physical Interval Note:  11/01/2020 10:42 AM  [name] [name]  has presented today for surgery, with the diagnosis of Perma Cath Removal   End Stage Renal.  The various methods of treatment have been discussed with the patient and family. After consideration of risks, benefits and other options for treatment, the patient has consented to  Procedure(s): DIALYSIS/PERMA CATHETER REMOVAL (N/A) as a surgical intervention.  The patient's history has been reviewed, patient examined, no change in status, stable for surgery.  I have reviewed the patient's chart and labs.  Questions were answered to the patient's satisfaction.     [name]

## 2021-01-14 ENCOUNTER — Other Ambulatory Visit (INDEPENDENT_AMBULATORY_CARE_PROVIDER_SITE_OTHER): Payer: Self-pay | Admitting: Nurse Practitioner

## 2021-01-14 DIAGNOSIS — N186 End stage renal disease: Secondary | ICD-10-CM

## 2021-01-15 ENCOUNTER — Ambulatory Visit (INDEPENDENT_AMBULATORY_CARE_PROVIDER_SITE_OTHER): Payer: Medicare Other | Admitting: Nurse Practitioner

## 2021-01-15 ENCOUNTER — Encounter (INDEPENDENT_AMBULATORY_CARE_PROVIDER_SITE_OTHER): Payer: Self-pay | Admitting: Nurse Practitioner

## 2021-01-15 ENCOUNTER — Other Ambulatory Visit: Payer: Self-pay

## 2021-01-15 ENCOUNTER — Ambulatory Visit (INDEPENDENT_AMBULATORY_CARE_PROVIDER_SITE_OTHER): Payer: Medicare Other

## 2021-01-15 VITALS — BP 98/66 | HR 81 | Ht 73.0 in | Wt 278.0 lb

## 2021-01-15 DIAGNOSIS — N186 End stage renal disease: Secondary | ICD-10-CM

## 2021-01-15 DIAGNOSIS — E1122 Type 2 diabetes mellitus with diabetic chronic kidney disease: Secondary | ICD-10-CM | POA: Diagnosis not present

## 2021-01-15 DIAGNOSIS — I10 Essential (primary) hypertension: Secondary | ICD-10-CM

## 2021-01-15 DIAGNOSIS — Z992 Dependence on renal dialysis: Secondary | ICD-10-CM | POA: Diagnosis not present

## 2021-01-15 DIAGNOSIS — Z794 Long term (current) use of insulin: Secondary | ICD-10-CM

## 2021-01-15 NOTE — Progress Notes (Signed)
Subjective:    Patient ID: [name] [name], male    DOB: 30-Nov-1975, 45 y.o.   MRN: [id number] Chief Complaint  Patient presents with  . Follow-up    3 Mo HDA    [name] [name] is a 45 year old male that returns to the office for followup of their dialysis access. The function of the access has been stable. The patient denies increased bleeding time or increased recirculation. Patient denies difficulty with cannulation. The patient denies hand pain or other symptoms consistent with steal phenomena.  No significant arm swelling.  He recently had his PermCath removed and there is no issues at that site.  The patient denies redness or swelling at the access site. The patient denies fever or chills at home or while on dialysis.  The patient denies amaurosis fugax or recent TIA symptoms. There are no recent neurological changes noted. The patient denies claudication symptoms or rest pain symptoms. The patient denies history of DVT, PE or superficial thrombophlebitis. The patient denies recent episodes of angina or shortness of breath.   Today the patient has a flow volume of 2128.  This is consistent with his previous flow volume of 2199.  The patient does have some elevated velocities near the confluence however they are not yet significant.       Review of Systems  Hematological:  Does not bruise/bleed easily.  All other systems reviewed and are negative.     Objective:   Physical Exam Vitals reviewed.  HENT:     Head: Normocephalic.  Cardiovascular:     Rate and Rhythm: Normal rate.     Pulses:          Radial pulses are 2+ on the left side.     Arteriovenous access: Left arteriovenous access is present.    Comments: Left brachiocephalic jump graft with good thrill and bruit Pulmonary:     Effort: Pulmonary effort is normal.  Skin:    General: Skin is warm and dry.  Neurological:     Mental Status: He is alert and oriented to person, place, and time.  Psychiatric:         Mood and Affect: Mood normal.        Behavior: Behavior normal.        Thought Content: Thought content normal.        Judgment: Judgment normal.    BP 98/66   Pulse 81   Ht '6\' 1"'$  (1.854 m)   Wt 278 lb (126.1 kg)   BMI 36.68 kg/m   Past Medical History:  Diagnosis Date  . CHF (congestive heart failure) ([place])   . Coronary artery disease   . Diabetes mellitus without complication ([place])   . Dialysis patient [facility])    M-W-F  . GERD (gastroesophageal reflux disease)   . Hypertension   . Kyrle's disease   . Renal disorder    on dialysis for 4 years three times a week last was Tuesday  . Secondary hyperparathyroidism of renal origin ([place])   . Shortness of breath dyspnea   . Type 2 diabetes mellitus with diabetic nephropathy ([place])   . Type 2 diabetes mellitus with diabetic retinopathy ([place])     Social History   Socioeconomic History  . Marital status: Single    Spouse name: Not on file  . Number of children: Not on file  . Years of education: Not on file  . Highest education level: Not on file  Occupational History  . Not on file  Tobacco Use  . Smoking status: Never  . Smokeless tobacco: Never  Vaping Use  . Vaping Use: Never used  Substance and Sexual Activity  . Alcohol use: Not Currently    Comment: occ. shot  . Drug use: No  . Sexual activity: Not on file  Other Topics Concern  . Not on file  Social History Narrative   Lives by himself   Social Determinants of Health   Financial Resource Strain: Not on file  Food Insecurity: Not on file  Transportation Needs: Not on file  Physical Activity: Not on file  Stress: Not on file  Social Connections: Not on file  Intimate Partner Violence: Not on file    Past Surgical History:  Procedure Laterality Date  . A/V FISTULAGRAM Left 10/01/2017   Procedure: A/V FISTULAGRAM;  Surgeon: Algernon Huxley, MD;  Location: [place] [facility];  Service: Cardiovascular;  Laterality: Left;  . A/V FISTULAGRAM Left 05/17/2020    Procedure: A/V FISTULAGRAM;  Surgeon: Algernon Huxley, MD;  Location: [place] [facility];  Service: Cardiovascular;  Laterality: Left;  . CATARACT EXTRACTION W/PHACO Right 06/30/2018   Procedure: CATARACT EXTRACTION PHACO AND INTRAOCULAR LENS PLACEMENT (IOC)-RIGHT;  Surgeon: Eulogio Bear, MD;  Location: [facility];  Service: Ophthalmology;  Laterality: Right;  [place] 00:07.0 CDE 0.16 FLUID PACK lOT # [id number]  . CATARACT EXTRACTION W/PHACO Left 12/24/2018   Procedure: CATARACT EXTRACTION PHACO AND INTRAOCULAR LENS PLACEMENT (IOC);  Surgeon: Eulogio Bear, MD;  Location: [facility];  Service: Ophthalmology;  Laterality: Left;  [place] 00:30.0 CDE 2.01 FLUID PACK LOT # [id number]  . COLONOSCOPY WITH PROPOFOL N/A 04/23/2017   Procedure: COLONOSCOPY WITH PROPOFOL;  Surgeon: [name], MD;  Location: [facility] ENDOSCOPY;  Service: Endoscopy;  Laterality: N/A;  . COLONOSCOPY WITH PROPOFOL N/A 07/02/2017   Procedure: COLONOSCOPY WITH PROPOFOL;  Surgeon: [name], MD;  Location[facility] ENDOSCOPY;  Service: Endoscopy;  Laterality: N/A;  . DIALYSIS/PERMA CATHETER INSERTION N/A 09/27/2020   Procedure: DIALYSIS/PERMA CATHETER INSERTION;  Surgeon: Algernon Huxley, MD;  Location: [place] [facility];  Service: Cardiovascular;  Laterality: N/A;  . DIALYSIS/PERMA CATHETER REMOVAL N/A 11/01/2020   Procedure: DIALYSIS/PERMA CATHETER REMOVAL;  Surgeon: Algernon Huxley, MD;  Location: [place] [facility];  Service: Cardiovascular;  Laterality: N/A;  . ESOPHAGOGASTRODUODENOSCOPY (EGD) WITH PROPOFOL N/A 04/23/2017   Procedure: ESOPHAGOGASTRODUODENOSCOPY (EGD) WITH PROPOFOL;  Surgeon: [name], MD;  Location: [facility] ENDOSCOPY;  Service: Endoscopy;  Laterality: N/A;  . ESOPHAGOGASTRODUODENOSCOPY (EGD) WITH PROPOFOL N/A 03/27/2020   Procedure: ESOPHAGOGASTRODUODENOSCOPY (EGD) WITH PROPOFOL;  Surgeon: Lesly Rubenstein, MD;  Location: [facility] ENDOSCOPY;  Service: Endoscopy;  Laterality: N/A;  . EYE SURGERY     RETINA   . INSERTION OF DIALYSIS CATHETER Right 08/09/2020   Procedure: INSERTION OF DIALYSIS CATHETER;  Surgeon: Algernon Huxley, MD;  Location: [facility];  Service: Vascular;  Laterality: Right;  . PERIPHERAL VASCULAR CATHETERIZATION N/A 05/07/2015   Procedure: A/V Shuntogram/Fistulagram;  Surgeon: Katha Cabal, MD;  Location: [place] [facility];  Service: Cardiovascular;  Laterality: N/A;  . PERIPHERAL VASCULAR CATHETERIZATION Left 05/07/2015   Procedure: A/V Shunt Intervention;  Surgeon: Katha Cabal, MD;  Location: [place] [facility];  Service: Cardiovascular;  Laterality: Left;  . PERIPHERAL VASCULAR THROMBECTOMY Left 07/16/2020   Procedure: PERIPHERAL VASCULAR THROMBECTOMY;  Surgeon: Algernon Huxley, MD;  Location: [place] [facility];  Service: Cardiovascular;  Laterality: Left;  . PERIPHERAL VASCULAR THROMBECTOMY Left 08/27/2020   Procedure:  PERIPHERAL VASCULAR THROMBECTOMY;  Surgeon: Algernon Huxley, MD;  Location: [place] [facility];  Service: Cardiovascular;  Laterality: Left;  . REVISON OF ARTERIOVENOUS FISTULA Left 08/09/2020   Procedure: REVISON OF ARTERIOVENOUS FISTULA;  Surgeon: Algernon Huxley, MD;  Location: [facility];  Service: Vascular;  Laterality: Left;  . TEE WITHOUT CARDIOVERSION N/A 07/05/2015   Procedure: TRANSESOPHAGEAL ECHOCARDIOGRAM (TEE);  Surgeon: Dionisio David, MD;  Location: [facility];  Service: Cardiovascular;  Laterality: N/A;  . UPPER EXTREMITY ANGIOGRAPHY Left 06/24/2018   Procedure: UPPER EXTREMITY ANGIOGRAPHY;  Surgeon: Algernon Huxley, MD;  Location: [place] [facility];  Service: Cardiovascular;  Laterality: Left;  . WOUND DEBRIDEMENT N/A 09/25/2020   Procedure: DEBRIDEMENT WOUND;  Surgeon: Katha Cabal, MD;  Location: [facility];  Service: Vascular;  Laterality: N/A;    Family History  Problem Relation Age of Onset  . Diabetes Father     Allergies  Allergen Reactions  . Cephalexin Nausea Only  . Ivp Dye [Iodinated Diagnostic Agents] Diarrhea  . Lisinopril  Other (See Comments)    Pt cannot recall reaction, cough  . Other     CBC Latest Ref Rng & Units 09/28/2020 09/24/2020 09/23/2020  WBC 4.0 - 10.5 K/uL 7.3 8.0 8.3  Hemoglobin 13.0 - 17.0 g/dL 9.9(L) 9.6(L) 10.0(L)  Hematocrit 39.0 - 52.0 % 31.1(L) 30.6(L) 32.4(L)  Platelets 150 - 400 K/uL 193 210 225      CMP     Component Value Date/Time   NA 135 09/28/2020 1226   NA 138 02/24/2014 0359   K 3.8 09/28/2020 1226   K 3.5 02/24/2014 0359   CL 96 (L) 09/28/2020 1226   CL 97 (L) 02/24/2014 0359   CO2 25 09/28/2020 1226   CO2 31 02/24/2014 0359   GLUCOSE 145 (H) 09/28/2020 1226   GLUCOSE 231 (H) 02/24/2014 0359   BUN 32 (H) 09/28/2020 1226   BUN 38 (H) 02/24/2014 0359   CREATININE 9.78 (H) 09/28/2020 1226   CREATININE 12.04 (H) 02/24/2014 0359   CALCIUM 8.1 (L) 09/28/2020 1226   CALCIUM 7.9 (L) 02/24/2014 0359   PROT 8.1 09/23/2020 1212   PROT 7.3 02/22/2014 1526   ALBUMIN 3.2 (L) 09/24/2020 0433   ALBUMIN 3.2 (L) 02/22/2014 1526   AST 11 (L) 09/23/2020 1212   AST 27 02/22/2014 1526   ALT 7 09/23/2020 1212   ALT 17 02/22/2014 1526   ALKPHOS 77 09/23/2020 1212   ALKPHOS 73 02/22/2014 1526   BILITOT 0.8 09/23/2020 1212   BILITOT 0.8 02/22/2014 1526   GFRNONAA 6 (L) 09/28/2020 1226   GFRNONAA 5 (L) 02/24/2014 0359   GFRAA 3 (L) 05/07/2015 0937   GFRAA 6 (L) 02/24/2014 0359     No results found.     Assessment & Plan:   1. ESRD (end stage renal disease) ([place]) Recommend:  The patient is doing well and currently has adequate dialysis access. The patient's dialysis center is not reporting any access issues. Flow pattern is stable when compared to the prior ultrasound.  The patient should have a duplex ultrasound of the dialysis access in 6 months. The patient will follow-up with me in the office after each ultrasound    - VAS [place] (AVF, AVG); Future  2. Primary hypertension Continue antihypertensive medications as already ordered, these  medications have been reviewed and there are no changes at this time.   3. Type 2 diabetes mellitus with chronic kidney disease on chronic dialysis, with long-term current  use of insulin ([place]) Continue hypoglycemic medications as already ordered, these medications have been reviewed and there are no changes at this time.  Hgb A1C to be monitored as already arranged by primary service    Current Outpatient Medications on File Prior to Visit  Medication Sig Dispense Refill  . aspirin EC 81 MG tablet Take 81 mg by mouth daily at 6 PM. (1700)    . Beta Carotene (VITAMIN A) 25000 UNIT capsule Take 25,000 Units by mouth daily.    . calcium acetate (PHOSLO) 667 MG capsule Take by mouth in the morning and at bedtime. Takes in AM    . carvedilol (COREG) 12.5 MG tablet Take 12.5 mg by mouth 2 (two) times daily with a meal.     . clindamycin (CLINDAGEL) 1 % gel Apply topically.    Marland Kitchen HUMULIN 70/30 (70-30) 100 UNIT/ML injection Inject 10-30 Units into the skin See admin instructions. 10 units before breakfast and 30 units before supper ON DIALYSIS DAYS (M-W-F), NO AM INSULIN    . hydrOXYzine (ATARAX/VISTARIL) 25 MG tablet Take 25 mg by mouth at bedtime.    . lidocaine-prilocaine (EMLA) cream Apply topically.    Marland Kitchen loperamide (IMODIUM) 2 MG capsule Take 2 mg by mouth as needed for diarrhea or loose stools (Take 1/2-1 tablet as meeded to prevent diarrhea. Can take up to 1 tablet every 4 hours. Do not exceed 4 tablets in a day.).    Marland Kitchen losartan (COZAAR) 50 MG tablet Take 50 mg by mouth daily. (1700)    . mupirocin ointment (BACTROBAN) 2 % Apply 1 application topically 2 (two) times daily. 22 g 1  . omeprazole (PRILOSEC) 20 MG capsule Take 20 mg by mouth daily.    Marland Kitchen oxyCODONE-acetaminophen (PERCOCET/ROXICET) 5-325 MG tablet Take 1 tablet by mouth every 6 (six) hours as needed for severe pain. 30 tablet 0  . SENSIPAR 60 MG tablet Take 60 mg by mouth daily at 6 PM. 1700  3  . sevelamer carbonate (RENVELA) 2.4 g  PACK     . vitamin E 1000 UNIT capsule Take 1,000 Units by mouth daily. Not sure of dose; started 4/29    . pantoprazole (PROTONIX) 40 MG tablet Take 40 mg by mouth daily.     No current facility-administered medications on file prior to visit.    There are no Patient Instructions on file for this visit. No follow-ups on file.   Kris Hartmann, NP

## 2021-01-29 ENCOUNTER — Ambulatory Visit (INDEPENDENT_AMBULATORY_CARE_PROVIDER_SITE_OTHER): Payer: Medicare Other | Admitting: Nurse Practitioner

## 2021-01-29 ENCOUNTER — Emergency Department
Admission: EM | Admit: 2021-01-29 | Discharge: 2021-01-29 | Disposition: A | Discharge status: Home / Self Care | Payer: Medicare Other | Attending: Student in an Organized Health Care Education/Training Program | Admitting: Student in an Organized Health Care Education/Training Program

## 2021-01-29 ENCOUNTER — Encounter (INDEPENDENT_AMBULATORY_CARE_PROVIDER_SITE_OTHER): Payer: Medicare Other

## 2021-01-29 ENCOUNTER — Emergency Department: Payer: Medicare Other

## 2021-01-29 ENCOUNTER — Other Ambulatory Visit: Payer: Self-pay

## 2021-01-29 DIAGNOSIS — E11319 Type 2 diabetes mellitus with unspecified diabetic retinopathy without macular edema: Secondary | ICD-10-CM | POA: Insufficient documentation

## 2021-01-29 DIAGNOSIS — I132 Hypertensive heart and chronic kidney disease with heart failure and with stage 5 chronic kidney disease, or end stage renal disease: Secondary | ICD-10-CM | POA: Diagnosis not present

## 2021-01-29 DIAGNOSIS — E1140 Type 2 diabetes mellitus with diabetic neuropathy, unspecified: Secondary | ICD-10-CM | POA: Insufficient documentation

## 2021-01-29 DIAGNOSIS — Z992 Dependence on renal dialysis: Secondary | ICD-10-CM | POA: Insufficient documentation

## 2021-01-29 DIAGNOSIS — Z794 Long term (current) use of insulin: Secondary | ICD-10-CM | POA: Diagnosis not present

## 2021-01-29 DIAGNOSIS — N186 End stage renal disease: Secondary | ICD-10-CM | POA: Insufficient documentation

## 2021-01-29 DIAGNOSIS — I509 Heart failure, unspecified: Secondary | ICD-10-CM | POA: Diagnosis not present

## 2021-01-29 DIAGNOSIS — I2510 Atherosclerotic heart disease of native coronary artery without angina pectoris: Secondary | ICD-10-CM | POA: Insufficient documentation

## 2021-01-29 DIAGNOSIS — Z79899 Other long term (current) drug therapy: Secondary | ICD-10-CM | POA: Insufficient documentation

## 2021-01-29 DIAGNOSIS — E1122 Type 2 diabetes mellitus with diabetic chronic kidney disease: Secondary | ICD-10-CM | POA: Insufficient documentation

## 2021-01-29 DIAGNOSIS — K5900 Constipation, unspecified: Secondary | ICD-10-CM | POA: Insufficient documentation

## 2021-01-29 DIAGNOSIS — Z7982 Long term (current) use of aspirin: Secondary | ICD-10-CM | POA: Diagnosis not present

## 2021-01-29 DIAGNOSIS — R1084 Generalized abdominal pain: Secondary | ICD-10-CM | POA: Diagnosis present

## 2021-01-29 LAB — COMPREHENSIVE METABOLIC PANEL
ALT: 9 U/L (ref 0–44)
AST: 11 U/L — ABNORMAL LOW (ref 15–41)
Albumin: 3.8 g/dL (ref 3.5–5.0)
Alkaline Phosphatase: 90 U/L (ref 38–126)
Anion gap: 13 (ref 5–15)
BUN: 42 mg/dL — ABNORMAL HIGH (ref 6–20)
CO2: 25 mmol/L (ref 22–32)
Calcium: 8.2 mg/dL — ABNORMAL LOW (ref 8.9–10.3)
Chloride: 96 mmol/L — ABNORMAL LOW (ref 98–111)
Creatinine, Ser: 14.02 mg/dL — ABNORMAL HIGH (ref 0.61–1.24)
GFR, Estimated: 4 mL/min — ABNORMAL LOW (ref >60)
Glucose, Bld: 292 mg/dL — ABNORMAL HIGH (ref 70–99)
Potassium: 4.8 mmol/L (ref 3.5–5.1)
Sodium: 134 mmol/L — ABNORMAL LOW (ref 135–145)
Total Bilirubin: 0.8 mg/dL (ref 0.3–1.2)
Total Protein: 8.1 g/dL (ref 6.5–8.1)

## 2021-01-29 LAB — CBC
HCT: 40.6 % (ref 39.0–52.0)
Hemoglobin: 13.1 g/dL (ref 13.0–17.0)
MCH: 28.4 pg (ref 26.0–34.0)
MCHC: 32.3 g/dL (ref 30.0–36.0)
MCV: 87.9 fL (ref 80.0–100.0)
Platelets: 161 10*3/uL (ref 150–400)
RBC: 4.62 MIL/uL (ref 4.22–5.81)
RDW: 15.7 % — ABNORMAL HIGH (ref 11.5–15.5)
WBC: 7 10*3/uL (ref 4.0–10.5)
nRBC: 0 % (ref 0.0–0.2)

## 2021-01-29 LAB — LIPASE, BLOOD: Lipase: 96 U/L — ABNORMAL HIGH (ref 11–51)

## 2021-01-29 NOTE — Discharge Instructions (Signed)
Please use MiraLAX at least one half capful per day to have 1 solid bowel movement per day

## 2021-01-29 NOTE — ED Provider Notes (Signed)
[facility] Emergency Department Provider Note   ____________________________________________   Event Date/Time   First MD Initiated Contact with Patient 01/29/21 1951     (approximate)  I have reviewed the triage vital signs and the nursing notes.   HISTORY  Chief Complaint Abdominal Pain    HPI [name] [name] is a 45 y.o. male who presents for generalized abdominal pain in the setting of constipation  LOCATION: Abdomen DURATION: 1 week prior to arrival TIMING: Worsening since onset SEVERITY: Moderate QUALITY: Dull aching generalized pain CONTEXT: Patient states that he has been trouble going to the bathroom over the last week and has been only passing small hard stools MODIFYING FACTORS: Denies any exacerbating or relieving factors but does state that he takes [employer] during his dialysis sessions so that he does not have to stop dialysis to use the bathroom ASSOCIATED SYMPTOMS: Constipation   Per medical record review, patient has history of CHF, CAD, diabetes, and end-stage renal disease on dialysis M/W/F          Past Medical History:  Diagnosis Date   CHF (congestive heart failure) ([place])    Coronary artery disease    Diabetes mellitus without complication ([place])    Dialysis patient ([place])    M-W-F   GERD (gastroesophageal reflux disease)    Hypertension    Kyrle's disease    Renal disorder    on dialysis for 4 years three times a week last was Tuesday   Secondary hyperparathyroidism of renal origin ([place])    Shortness of breath dyspnea    Type 2 diabetes mellitus with diabetic nephropathy ([place])    Type 2 diabetes mellitus with diabetic retinopathy ([place])     Patient Active Problem List   Diagnosis Date Noted   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   Coronary artery disease    Obesity, Class III, BMI 40-49.9 (morbid obesity) ([place])    Chronic venous insufficiency 02/17/2017   Hypertension 07/24/2016   Venous ulcer of left leg  ([place]) 07/24/2016   Uncontrolled type 2 diabetes mellitus with hyperglycemia, with long-term current use of insulin ([place]) 11/27/2015   End stage renal disease ([place]) 11/02/2014   Diabetes mellitus with ESRD (end-stage renal disease) ([place]) 11/02/2014    Past Surgical History:  Procedure Laterality Date   A/V FISTULAGRAM Left 10/01/2017   Procedure: A/V FISTULAGRAM;  Surgeon: Algernon Huxley, MD;  Location: [place] [facility];  Service: Cardiovascular;  Laterality: Left;   A/V FISTULAGRAM Left 05/17/2020   Procedure: A/V FISTULAGRAM;  Surgeon: Algernon Huxley, MD;  Location: [place] [facility];  Service: Cardiovascular;  Laterality: Left;   CATARACT EXTRACTION W/PHACO Right 06/30/2018   Procedure: CATARACT EXTRACTION PHACO AND INTRAOCULAR LENS PLACEMENT (IOC)-RIGHT;  Surgeon: Eulogio Bear, MD;  Location: [facility];  Service: Ophthalmology;  Laterality: Right;  [place] 00:07.0 CDE 0.16 FLUID PACK lOT # [id number]   CATARACT EXTRACTION W/PHACO Left 12/24/2018   Procedure: CATARACT EXTRACTION PHACO AND INTRAOCULAR LENS PLACEMENT (IOC);  Surgeon: Eulogio Bear, MD;  Location: [facility];  Service: Ophthalmology;  Laterality: Left;  [place] 00:30.0 CDE 2.01 FLUID PACK LOT # [id number]   COLONOSCOPY WITH PROPOFOL N/A 04/23/2017   Procedure: COLONOSCOPY WITH PROPOFOL;  Surgeon: [name], MD;  Location: [facility] ENDOSCOPY;  Service: Endoscopy;  Laterality: N/A;   COLONOSCOPY WITH PROPOFOL N/A 07/02/2017   Procedure: COLONOSCOPY WITH PROPOFOL;  Surgeon: [name], MD;  Location: [facility] ENDOSCOPY;  Service: Endoscopy;  Laterality: N/A;  DIALYSIS/PERMA CATHETER INSERTION N/A 09/27/2020   Procedure: DIALYSIS/PERMA CATHETER INSERTION;  Surgeon: Algernon Huxley, MD;  Location: [place] [facility];  Service: Cardiovascular;  Laterality: N/A;   DIALYSIS/PERMA CATHETER REMOVAL N/A 11/01/2020   Procedure: DIALYSIS/PERMA CATHETER REMOVAL;  Surgeon: Algernon Huxley, MD;  Location: [place] [facility];  Service:  Cardiovascular;  Laterality: N/A;   ESOPHAGOGASTRODUODENOSCOPY (EGD) WITH PROPOFOL N/A 04/23/2017   Procedure: ESOPHAGOGASTRODUODENOSCOPY (EGD) WITH PROPOFOL;  Surgeon: [name], MD;  [facility] ENDOSCOPY;  Service: Endoscopy;  Laterality: N/A;   ESOPHAGOGASTRODUODENOSCOPY (EGD) WITH PROPOFOL N/A 03/27/2020   Procedure: ESOPHAGOGASTRODUODENOSCOPY (EGD) WITH PROPOFOL;  Surgeon: Lesly Rubenstein, MD;  Location: [facility] ENDOSCOPY;  Service: Endoscopy;  Laterality: N/A;   EYE SURGERY     RETINA   INSERTION OF DIALYSIS CATHETER Right 08/09/2020   Procedure: INSERTION OF DIALYSIS CATHETER;  Surgeon: Algernon Huxley, MD;  Location: [facility];  Service: Vascular;  Laterality: Right;   PERIPHERAL VASCULAR CATHETERIZATION N/A 05/07/2015   Procedure: A/V Shuntogram/Fistulagram;  Surgeon: Katha Cabal, MD;  Location: [place] [facility];  Service: Cardiovascular;  Laterality: N/A;   PERIPHERAL VASCULAR CATHETERIZATION Left 05/07/2015   Procedure: A/V Shunt Intervention;  Surgeon: Katha Cabal, MD;  Location: [place] [facility];  Service: Cardiovascular;  Laterality: Left;   PERIPHERAL VASCULAR THROMBECTOMY Left 07/16/2020   Procedure: PERIPHERAL VASCULAR THROMBECTOMY;  Surgeon: Algernon Huxley, MD;  Location: [place] [facility];  Service: Cardiovascular;  Laterality: Left;   PERIPHERAL VASCULAR THROMBECTOMY Left 08/27/2020   Procedure: PERIPHERAL VASCULAR THROMBECTOMY;  Surgeon: Algernon Huxley, MD;  Location: [place] [facility];  Service: Cardiovascular;  Laterality: Left;   REVISON OF ARTERIOVENOUS FISTULA Left 08/09/2020   Procedure: REVISON OF ARTERIOVENOUS FISTULA;  Surgeon: Algernon Huxley, MD;  Location: [facility];  Service: Vascular;  Laterality: Left;   TEE WITHOUT CARDIOVERSION N/A 07/05/2015   Procedure: TRANSESOPHAGEAL ECHOCARDIOGRAM (TEE);  Surgeon: Dionisio David, MD;  Location: [facility];  Service: Cardiovascular;  Laterality: N/A;   UPPER EXTREMITY ANGIOGRAPHY Left 06/24/2018    Procedure: UPPER EXTREMITY ANGIOGRAPHY;  Surgeon: Algernon Huxley, MD;  Location: [place] [facility];  Service: Cardiovascular;  Laterality: Left;   WOUND DEBRIDEMENT N/A 09/25/2020   Procedure: DEBRIDEMENT WOUND;  Surgeon: Katha Cabal, MD;  Location: [facility];  Service: Vascular;  Laterality: N/A;    Prior to Admission medications   Medication Sig Start Date End Date Taking? Authorizing Provider  aspirin EC 81 MG tablet Take 81 mg by mouth daily at 6 PM. (1700) 01/14/16   [provider]  Beta Carotene (VITAMIN A) 25000 UNIT capsule Take 25,000 Units by mouth daily.    [provider]  calcium acetate (PHOSLO) 667 MG capsule Take by mouth in the morning and at bedtime. Takes in AM    [provider]  carvedilol (COREG) 12.5 MG tablet Take 12.5 mg by mouth 2 (two) times daily with a meal.     [provider]  clindamycin (CLINDAGEL) 1 % gel Apply topically. 08/24/20   [provider]  HUMULIN 70/30 (70-30) 100 UNIT/ML injection Inject 10-30 Units into the skin See admin instructions. 10 units before breakfast and 30 units before supper ON DIALYSIS DAYS (M-W-F), NO AM INSULIN 04/19/15   [provider]  hydrOXYzine (ATARAX/VISTARIL) 25 MG tablet Take 25 mg by mouth at bedtime. 08/23/20   [provider]  lidocaine-prilocaine (EMLA) cream Apply topically. 08/27/20 08/27/21  [provider]  loperamide (  IMODIUM) 2 MG capsule Take 2 mg by mouth as needed for diarrhea or loose stools (Take 1/2-1 tablet as meeded to prevent diarrhea. Can take up to 1 tablet every 4 hours. Do not exceed 4 tablets in a day.).    [provider]  losartan (COZAAR) 50 MG tablet Take 50 mg by mouth daily. (1700) 05/22/18   [provider]  mupirocin ointment (BACTROBAN) 2 % Apply 1 application topically 2 (two) times daily. 08/23/20   Kris Hartmann, NP  omeprazole (PRILOSEC) 20 MG capsule Take 20 mg by mouth daily. 03/01/20 03/01/21   [provider]  oxyCODONE-acetaminophen (PERCOCET/ROXICET) 5-325 MG tablet Take 1 tablet by mouth every 6 (six) hours as needed for severe pain. 08/23/20   Kris Hartmann, NP  pantoprazole (PROTONIX) 40 MG tablet Take 40 mg by mouth daily. 01/08/21   [provider]  SENSIPAR 60 MG tablet Take 60 mg by mouth daily at 6 PM. 1700 04/28/15   [provider]  sevelamer carbonate (RENVELA) 2.4 g PACK  02/27/20   [provider]  vitamin E 1000 UNIT capsule Take 1,000 Units by mouth daily. Not sure of dose; started 4/29    [provider]    Allergies Cephalexin, Ivp dye [iodinated diagnostic agents], Lisinopril, and Other  Family History  Problem Relation Age of Onset   Diabetes Father     Social History Social History   Tobacco Use   Smoking status: Never   Smokeless tobacco: Never  Vaping Use   Vaping Use: Never used  Substance Use Topics   Alcohol use: Not Currently    Comment: occ. shot   Drug use: No    Review of Systems Constitutional: No fever/chills Eyes: No visual changes. ENT: No sore throat. Cardiovascular: Denies chest pain. Respiratory: Denies shortness of breath. Gastrointestinal: Endorses generalized abdominal pain and constipation.  No nausea, no vomiting.  No diarrhea. Genitourinary: Negative for dysuria. Musculoskeletal: Negative for acute arthralgias Skin: Negative for rash. Neurological: Negative for headaches, weakness/numbness/paresthesias in any extremity Psychiatric: Negative for suicidal ideation/homicidal ideation ____________________________________________   PHYSICAL EXAM:  VITAL SIGNS: ED Triage Vitals  Enc Vitals Group     BP 01/29/21 1447 133/86     Pulse Rate 01/29/21 1447 91     Resp 01/29/21 1447 16     Temp 01/29/21 1447 99.9 F (37.7 C)     Temp Source 01/29/21 1447 Oral     SpO2 01/29/21 1447 96 %     Weight 01/29/21 1448 277 lb 12.5 oz (126 kg)     Height 01/29/21 1448 '6\' 1"'$  (1.854  m)     Head Circumference --      Peak Flow --      Pain Score 01/29/21 1447 8     Pain Loc --      Pain Edu? --      Excl. in [place]? --    Constitutional: Alert and oriented. Well appearing and in no acute distress. Eyes: Conjunctivae are normal. PERRL. Head: Atraumatic. Nose: No congestion/rhinnorhea. Mouth/Throat: Mucous membranes are moist. Neck: No stridor Cardiovascular: Grossly normal heart sounds.  Good peripheral circulation. Respiratory: Normal respiratory effort.  No retractions. Gastrointestinal: Soft and nontender. No distention. Musculoskeletal: No obvious deformities Neurologic:  Normal speech and language. No gross focal neurologic deficits are appreciated. Skin:  Skin is warm and dry. No rash noted. Psychiatric: Mood and affect are normal. Speech and behavior are normal.  ____________________________________________   LABS (all labs ordered are  listed, but only abnormal results are displayed)  Labs Reviewed  LIPASE, BLOOD - Abnormal; Notable for the following components:      Result Value   Lipase 96 (*)    All other components within normal limits  COMPREHENSIVE METABOLIC PANEL - Abnormal; Notable for the following components:   Sodium 134 (*)    Chloride 96 (*)    Glucose, Bld 292 (*)    BUN 42 (*)    Creatinine, Ser 14.02 (*)    Calcium 8.2 (*)    AST 11 (*)    GFR, Estimated 4 (*)    All other components within normal limits  CBC - Abnormal; Notable for the following components:   RDW 15.7 (*)    All other components within normal limits   RADIOLOGY  ED MD interpretation: X-ray of the abdomen with chest is negative for any acute abdominal or cardiopulmonary disease  Official radiology report(s): DG Abdomen Acute W/Chest  Result Date: 01/29/2021 CLINICAL DATA:  Pt reports abd pain since last week with difficulty passing stool, states only small amounts and last episode was today, pt was seen by his cardiologist today and was going to order a ct  but states that it was going to take too long so he came to the ER. Pt states his last dialysis was monday EXAM: DG ABDOMEN ACUTE WITH 1 VIEW CHEST COMPARISON:  None. FINDINGS: Normal bowel gas pattern.  No bowel air-fluid levels.  No free air. No evidence of renal or ureteral stones. Abdominopelvic soft tissues are unremarkable. Cardiac silhouette is normal in size. Normal mediastinal and hilar contours. Clear lungs. Left subclavian vascular stent. Skeletal structures are grossly intact. IMPRESSION: Negative abdominal radiographs.  No acute cardiopulmonary disease. Electronically Signed   By: Lajean Manes M.D.   On: 01/29/2021 15:45    ____________________________________________   PROCEDURES  Procedure(s) performed (including Critical Care):  .1-3 Lead EKG Interpretation Performed by: Naaman Plummer, MD Authorized by: Naaman Plummer, MD     Interpretation: normal     ECG rate:  90   ECG rate assessment: normal     Rhythm: sinus rhythm     Ectopy: none     Conduction: normal     ____________________________________________   INITIAL IMPRESSION / ASSESSMENT AND PLAN / ED COURSE  As part of my medical decision making, I reviewed the following data within the electronic medical record, if available:  Nursing notes reviewed and incorporated, Labs reviewed, EKG interpreted, Old chart reviewed, Radiograph reviewed and Notes from prior ED visits reviewed and incorporated        Patients history and exam most consistent with constipation as an etiology for their pain.  Patients symptoms not typical for other emergent causes of abdominal pain such as, but not limited to, appendicitis, abdominal aortic aneurysm, pancreatitis, SBO, mesenteric ischemia, serious intra-abdominal bacterial illness.  Patient without red flags concerning for cancer as a constipation etiology.  Rx: Miralax  Disposition:  Patient will be discharged with strict return precautions and follow up with primary  MD within 24-48 hours for further evaluation. Patient understands that this still may have an early presentation of an emergent medical condition such as appendicitis that will require a recheck.      ____________________________________________   FINAL CLINICAL IMPRESSION(S) / ED DIAGNOSES  Final diagnoses:  Constipation, unspecified constipation type     ED Discharge Orders     None        Note:  This document was prepared  using Systems analyst and may include unintentional dictation errors.    Naaman Plummer, MD 01/29/21 [phone]

## 2021-01-29 NOTE — ED Provider Notes (Signed)
Emergency Medicine Provider Triage Evaluation Note  [name] [name] , a 45 y.o. male  was evaluated in triage.  Pt complains of generalized abdominal pain some nausea no vomiting still passing gas.  He is on dialysis.  Denies any chest pain does have some shortness of breath.  No focal specific pain but does feel bloated..  Review of Systems  Positive: nausea Negative: Chest pain  Physical Exam  BP 133/86 (BP Location: Right Arm)   Pulse 91   Temp 99.9 F (37.7 C) (Oral)   Resp 16   Ht '6\' 1"'$  (1.854 m)   Wt 126 kg   SpO2 96%   BMI 36.65 kg/m  Gen:   Awake, no distress   Resp:  Normal effort  MSK:   Moves extremities without difficulty  Other:  Abdominal pain with no focal tenderness, guarding or rebound.  Medical Decision Making  Medically screening exam initiated at 2:53 PM.  Appropriate orders placed.  [name] [name] was informed that the remainder of the evaluation will be completed by another provider, this initial triage assessment does not replace that evaluation, and the importance of remaining in the ED until their evaluation is complete.     Merlyn Lot, MD 01/29/21 [phone]

## 2021-01-29 NOTE — ED Triage Notes (Signed)
Pt reports abd pain since last week with difficulty passing stool, states only small amounts and last episode was today, pt was seen by his cardiologist today and was going to order a ct but states that it was going to take too long so he came to the ER. Pt states his last dialysis was monday

## 2021-03-14 ENCOUNTER — Ambulatory Visit (INDEPENDENT_AMBULATORY_CARE_PROVIDER_SITE_OTHER): Payer: Medicare Other | Admitting: Nurse Practitioner

## 2021-03-14 ENCOUNTER — Encounter (INDEPENDENT_AMBULATORY_CARE_PROVIDER_SITE_OTHER): Payer: Medicare Other

## 2021-03-26 ENCOUNTER — Other Ambulatory Visit: Payer: Self-pay

## 2021-03-26 ENCOUNTER — Encounter: Payer: Medicare Other | Attending: Nurse Practitioner | Admitting: *Deleted

## 2021-03-26 DIAGNOSIS — N186 End stage renal disease: Secondary | ICD-10-CM | POA: Insufficient documentation

## 2021-03-26 DIAGNOSIS — I120 Hypertensive chronic kidney disease with stage 5 chronic kidney disease or end stage renal disease: Secondary | ICD-10-CM | POA: Diagnosis not present

## 2021-03-26 DIAGNOSIS — Z992 Dependence on renal dialysis: Secondary | ICD-10-CM | POA: Diagnosis not present

## 2021-03-26 DIAGNOSIS — Z794 Long term (current) use of insulin: Secondary | ICD-10-CM

## 2021-03-26 DIAGNOSIS — E1122 Type 2 diabetes mellitus with diabetic chronic kidney disease: Secondary | ICD-10-CM | POA: Insufficient documentation

## 2021-03-26 DIAGNOSIS — E663 Overweight: Secondary | ICD-10-CM | POA: Insufficient documentation

## 2021-03-26 DIAGNOSIS — K219 Gastro-esophageal reflux disease without esophagitis: Secondary | ICD-10-CM | POA: Diagnosis not present

## 2021-03-26 NOTE — Progress Notes (Signed)
Pt seen today for [employer] Libre insertion instruction. He showed up last week at the office requesting instruction but he didn't bring his supplies nor did he have an appointment. Follow up appointment was made for today. He reports that he doesn't have anyone else to apply sensors now. He "doesn't like needles and no other family can assist." Informed him that I would teach him because he would not be able to come into the office every 2 weeks for sensor insertion. The patient also didn't want to insert to his arm. He has dialysis in his left arm and reports the BP cuff pulls the sensor off when applied to his right arm. He has been using his thighs prior to today. Informed him that this is not approved site by [employer] but he will not place sensor to any other area. Provided instruction and with much assisting he placed sensor to his left thigh. Reader scanned sensor and will require 1 hour warm-up. Instructed him to call back for any questions.

## 2021-04-15 ENCOUNTER — Other Ambulatory Visit: Payer: Self-pay

## 2021-04-15 ENCOUNTER — Encounter: Payer: Self-pay | Admitting: Emergency Medicine

## 2021-04-15 ENCOUNTER — Observation Stay
Admission: EM | Admit: 2021-04-15 | Discharge: 2021-04-17 | Disposition: A | Discharge status: Home / Self Care | Payer: Medicare Other | Attending: Internal Medicine | Admitting: Internal Medicine

## 2021-04-15 ENCOUNTER — Other Ambulatory Visit (INDEPENDENT_AMBULATORY_CARE_PROVIDER_SITE_OTHER): Payer: Self-pay | Admitting: Vascular Surgery

## 2021-04-15 DIAGNOSIS — Z992 Dependence on renal dialysis: Secondary | ICD-10-CM | POA: Diagnosis not present

## 2021-04-15 DIAGNOSIS — N186 End stage renal disease: Secondary | ICD-10-CM | POA: Diagnosis present

## 2021-04-15 DIAGNOSIS — I10 Essential (primary) hypertension: Secondary | ICD-10-CM | POA: Diagnosis present

## 2021-04-15 DIAGNOSIS — Z20822 Contact with and (suspected) exposure to covid-19: Secondary | ICD-10-CM | POA: Insufficient documentation

## 2021-04-15 DIAGNOSIS — E1122 Type 2 diabetes mellitus with diabetic chronic kidney disease: Secondary | ICD-10-CM | POA: Diagnosis not present

## 2021-04-15 DIAGNOSIS — Z7982 Long term (current) use of aspirin: Secondary | ICD-10-CM | POA: Diagnosis not present

## 2021-04-15 DIAGNOSIS — I2510 Atherosclerotic heart disease of native coronary artery without angina pectoris: Secondary | ICD-10-CM | POA: Diagnosis not present

## 2021-04-15 DIAGNOSIS — E875 Hyperkalemia: Secondary | ICD-10-CM

## 2021-04-15 DIAGNOSIS — Z79899 Other long term (current) drug therapy: Secondary | ICD-10-CM | POA: Diagnosis not present

## 2021-04-15 DIAGNOSIS — Y832 Surgical operation with anastomosis, bypass or graft as the cause of abnormal reaction of the patient, or of later complication, without mention of misadventure at the time of the procedure: Secondary | ICD-10-CM | POA: Insufficient documentation

## 2021-04-15 DIAGNOSIS — T82868A Thrombosis of vascular prosthetic devices, implants and grafts, initial encounter: Principal | ICD-10-CM | POA: Insufficient documentation

## 2021-04-15 DIAGNOSIS — Z6841 Body Mass Index (BMI) 40.0 and over, adult: Secondary | ICD-10-CM | POA: Diagnosis not present

## 2021-04-15 DIAGNOSIS — I509 Heart failure, unspecified: Secondary | ICD-10-CM | POA: Insufficient documentation

## 2021-04-15 DIAGNOSIS — E1165 Type 2 diabetes mellitus with hyperglycemia: Secondary | ICD-10-CM

## 2021-04-15 DIAGNOSIS — T82898A Other specified complication of vascular prosthetic devices, implants and grafts, initial encounter: Secondary | ICD-10-CM | POA: Diagnosis present

## 2021-04-15 DIAGNOSIS — I132 Hypertensive heart and chronic kidney disease with heart failure and with stage 5 chronic kidney disease, or end stage renal disease: Secondary | ICD-10-CM | POA: Diagnosis not present

## 2021-04-15 DIAGNOSIS — E66813 Obesity, class 3: Secondary | ICD-10-CM | POA: Diagnosis present

## 2021-04-15 DIAGNOSIS — Z794 Long term (current) use of insulin: Secondary | ICD-10-CM | POA: Diagnosis not present

## 2021-04-15 LAB — COMPREHENSIVE METABOLIC PANEL
ALT: 11 U/L (ref 0–44)
AST: 14 U/L — ABNORMAL LOW (ref 15–41)
Albumin: 3.6 g/dL (ref 3.5–5.0)
Alkaline Phosphatase: 106 U/L (ref 38–126)
Anion gap: 13 (ref 5–15)
BUN: 74 mg/dL — ABNORMAL HIGH (ref 6–20)
CO2: 23 mmol/L (ref 22–32)
Calcium: 7.3 mg/dL — ABNORMAL LOW (ref 8.9–10.3)
Chloride: 99 mmol/L (ref 98–111)
Creatinine, Ser: 16.37 mg/dL — ABNORMAL HIGH (ref 0.61–1.24)
GFR, Estimated: 3 mL/min — ABNORMAL LOW (ref >60)
Glucose, Bld: 169 mg/dL — ABNORMAL HIGH (ref 70–99)
Potassium: 5.6 mmol/L — ABNORMAL HIGH (ref 3.5–5.1)
Sodium: 135 mmol/L (ref 135–145)
Total Bilirubin: 0.6 mg/dL (ref 0.3–1.2)
Total Protein: 7.7 g/dL (ref 6.5–8.1)

## 2021-04-15 LAB — CBC WITH DIFFERENTIAL/PLATELET
Abs Immature Granulocytes: 0.03 10*3/uL (ref 0.00–0.07)
Basophils Absolute: 0.1 10*3/uL (ref 0.0–0.1)
Basophils Relative: 1 %
Eosinophils Absolute: 0.5 10*3/uL (ref 0.0–0.5)
Eosinophils Relative: 5 %
HCT: 37.9 % — ABNORMAL LOW (ref 39.0–52.0)
Hemoglobin: 11.8 g/dL — ABNORMAL LOW (ref 13.0–17.0)
Immature Granulocytes: 0 %
Lymphocytes Relative: 14 %
Lymphs Abs: 1.3 10*3/uL (ref 0.7–4.0)
MCH: 27.4 pg (ref 26.0–34.0)
MCHC: 31.1 g/dL (ref 30.0–36.0)
MCV: 87.9 fL (ref 80.0–100.0)
Monocytes Absolute: 0.7 10*3/uL (ref 0.1–1.0)
Monocytes Relative: 7 %
Neutro Abs: 7.3 10*3/uL (ref 1.7–7.7)
Neutrophils Relative %: 73 %
Platelets: 170 10*3/uL (ref 150–400)
RBC: 4.31 MIL/uL (ref 4.22–5.81)
RDW: 16.2 % — ABNORMAL HIGH (ref 11.5–15.5)
WBC: 9.9 10*3/uL (ref 4.0–10.5)
nRBC: 0 % (ref 0.0–0.2)

## 2021-04-15 LAB — RESP PANEL BY RT-PCR (FLU A&B, COVID) ARPGX2
Influenza A by PCR: NEGATIVE
Influenza B by PCR: NEGATIVE
SARS Coronavirus 2 by RT PCR: NEGATIVE

## 2021-04-15 LAB — CBG MONITORING, ED
Glucose-Capillary: 239 mg/dL — ABNORMAL HIGH (ref 70–99)
Glucose-Capillary: 272 mg/dL — ABNORMAL HIGH (ref 70–99)

## 2021-04-15 MED ORDER — SODIUM CHLORIDE 0.9 % IV SOLN: 100.0000 mL | INTRAVENOUS | Status: DC | PRN

## 2021-04-15 MED ORDER — LOSARTAN POTASSIUM 50 MG PO TABS
50.0000 mg | ORAL_TABLET | Freq: Every day | ORAL | Status: DC
  Administered 2021-04-15 – 2021-04-16 (×2): 50 mg via ORAL
  Filled 2021-04-15 (×2): qty 1

## 2021-04-15 MED ORDER — PATIROMER SORBITEX CALCIUM 8.4 G PO PACK
16.8000 g | PACK | Freq: Every day | ORAL | Status: DC
  Administered 2021-04-15 – 2021-04-17 (×2): 16.8 g via ORAL
  Filled 2021-04-15 (×3): qty 2

## 2021-04-15 MED ORDER — INSULIN ASPART 100 UNIT/ML IJ SOLN
0.0000 [IU] | Freq: Every day | INTRAMUSCULAR | Status: DC
  Filled 2021-04-15: qty 1

## 2021-04-15 MED ORDER — LIDOCAINE HCL (PF) 1 % IJ SOLN
5.0000 mL | INTRAMUSCULAR | Status: DC | PRN
  Filled 2021-04-15: qty 5

## 2021-04-15 MED ORDER — ONDANSETRON HCL 4 MG/2ML IJ SOLN: 4.0000 mg | Freq: Four times a day (QID) | INTRAMUSCULAR | Status: DC | PRN

## 2021-04-15 MED ORDER — ASPIRIN EC 81 MG PO TBEC
81.0000 mg | DELAYED_RELEASE_TABLET | Freq: Every day | ORAL | Status: DC
  Administered 2021-04-16 – 2021-04-17 (×2): 81 mg via ORAL
  Filled 2021-04-15 (×2): qty 1

## 2021-04-15 MED ORDER — HEPARIN SODIUM (PORCINE) 5000 UNIT/ML IJ SOLN
5000.0000 [IU] | Freq: Three times a day (TID) | INTRAMUSCULAR | Status: DC
  Administered 2021-04-15 – 2021-04-16 (×2): 5000 [IU] via SUBCUTANEOUS
  Filled 2021-04-15 (×2): qty 1

## 2021-04-15 MED ORDER — ACETAMINOPHEN 650 MG RE SUPP
650.0000 mg | Freq: Four times a day (QID) | RECTAL | Status: DC | PRN
  Filled 2021-04-15: qty 1

## 2021-04-15 MED ORDER — CHLORHEXIDINE GLUCONATE CLOTH 2 % EX PADS
6.0000 | MEDICATED_PAD | Freq: Every day | CUTANEOUS | Status: DC
  Administered 2021-04-17: 07:00:00 6 via TOPICAL
  Filled 2021-04-15: qty 6

## 2021-04-15 MED ORDER — HYDROXYZINE HCL 25 MG PO TABS
25.0000 mg | ORAL_TABLET | Freq: Every day | ORAL | Status: DC
  Administered 2021-04-15 – 2021-04-16 (×2): 25 mg via ORAL
  Filled 2021-04-15 (×2): qty 1

## 2021-04-15 MED ORDER — PANTOPRAZOLE SODIUM 40 MG PO TBEC
40.0000 mg | DELAYED_RELEASE_TABLET | Freq: Every day | ORAL | Status: DC
  Administered 2021-04-15 – 2021-04-17 (×3): 40 mg via ORAL
  Filled 2021-04-15 (×3): qty 1

## 2021-04-15 MED ORDER — CALCIUM ACETATE (PHOS BINDER) 667 MG PO CAPS
667.0000 mg | ORAL_CAPSULE | Freq: Two times a day (BID) | ORAL | Status: DC
  Administered 2021-04-16 – 2021-04-17 (×3): 667 mg via ORAL
  Filled 2021-04-15 (×5): qty 1

## 2021-04-15 MED ORDER — CINACALCET HCL 30 MG PO TABS
60.0000 mg | ORAL_TABLET | Freq: Every day | ORAL | Status: DC
  Administered 2021-04-16 – 2021-04-17 (×2): 60 mg via ORAL
  Filled 2021-04-15 (×2): qty 2

## 2021-04-15 MED ORDER — ONDANSETRON HCL 4 MG PO TABS: 4.0000 mg | ORAL_TABLET | Freq: Four times a day (QID) | ORAL | Status: DC | PRN

## 2021-04-15 MED ORDER — HEPARIN SODIUM (PORCINE) 1000 UNIT/ML DIALYSIS
1000.0000 [IU] | INTRAMUSCULAR | Status: DC | PRN
  Administered 2021-04-17: 09:00:00 1000 [IU] via INTRAVENOUS_CENTRAL
  Filled 2021-04-15: qty 1

## 2021-04-15 MED ORDER — ACETAMINOPHEN 325 MG PO TABS: 650.0000 mg | ORAL_TABLET | Freq: Four times a day (QID) | ORAL | Status: DC | PRN

## 2021-04-15 MED ORDER — CARVEDILOL 25 MG PO TABS
25.0000 mg | ORAL_TABLET | Freq: Two times a day (BID) | ORAL | Status: DC
  Administered 2021-04-15 – 2021-04-17 (×3): 25 mg via ORAL
  Filled 2021-04-15: qty 1
  Filled 2021-04-15: qty 4
  Filled 2021-04-15: qty 1

## 2021-04-15 MED ORDER — INSULIN ASPART 100 UNIT/ML IJ SOLN
0.0000 [IU] | Freq: Three times a day (TID) | INTRAMUSCULAR | Status: DC
  Administered 2021-04-16: 17:00:00 4 [IU] via SUBCUTANEOUS
  Administered 2021-04-16: 09:00:00 2 [IU] via SUBCUTANEOUS
  Administered 2021-04-17 (×2): 5 [IU] via SUBCUTANEOUS
  Filled 2021-04-15 (×4): qty 1

## 2021-04-15 MED ORDER — ALTEPLASE 2 MG IJ SOLR
2.0000 mg | Freq: Once | INTRAMUSCULAR | Status: DC | PRN
  Filled 2021-04-15: qty 2

## 2021-04-15 MED ORDER — PENTAFLUOROPROP-TETRAFLUOROETH EX AERO
1.0000 "application " | INHALATION_SPRAY | CUTANEOUS | Status: DC | PRN
  Filled 2021-04-15: qty 30

## 2021-04-15 MED ORDER — LIDOCAINE-PRILOCAINE 2.5-2.5 % EX CREA: 1.0000 "application " | TOPICAL_CREAM | CUTANEOUS | Status: DC | PRN

## 2021-04-15 MED ORDER — INSULIN ASPART 100 UNIT/ML IJ SOLN
INTRAMUSCULAR | Status: AC
  Administered 2021-04-15: 22:00:00 3 [IU] via SUBCUTANEOUS
  Filled 2021-04-15: qty 1

## 2021-04-15 MED ORDER — CARVEDILOL 6.25 MG PO TABS: 25.0000 mg | ORAL_TABLET | Freq: Two times a day (BID) | ORAL | Status: DC

## 2021-04-15 NOTE — ED Notes (Addendum)
This tech was unable to obtain labs after the first attempt. Pt would only let tech stick around his arm not his hand. Lab was called to obtain lab. Lab tech was called to informed pt got roomed in room 44

## 2021-04-15 NOTE — Assessment & Plan Note (Signed)
Continue home blood pressure medications.  Including Coreg, losartan.

## 2021-04-15 NOTE — Consult Note (Signed)
[facility] Vascular Consult Note  MRN : [id number]  [name] [name] is a 45 y.o. ([birth year]) male who presents with chief complaint of  Chief Complaint  Patient presents with   Vascular Access Problem   History of Present Illness:  The patient is a 45 year old male with hx of type 2 DM on insulin, end-stage renal disease on hemodialysis on Monday Wednesday Friday via left upper extremity AV fistula, hypertension presents to the ER today from the dialysis unit.    As per dialysis staff they were unable to access his left upper extremity AV fistula despite multiple attempts.  Patient sent to the ER for evaluation.  Patient denies any fever chills, nausea vomiting, diarrhea.  No chest pain.  No shortness of breath.  Vascular surgery was consulted by Dr. Bridgett Larsson in the setting of a nonfunctioning dialysis access for possible vascular intervention.  Current Facility-Administered Medications  Medication Dose Route Frequency Provider Last Rate Last Admin   [START ON 04/16/2021] Chlorhexidine Gluconate Cloth 2 % PADS 6 each  6 each Topical Q0600 Colon Flattery, NP       patiromer Daryll Drown) packet 16.8 g  16.8 g Oral Daily Harvest Dark, MD   16.8 g at 04/15/21 0946   Current Outpatient Medications  Medication Sig Dispense Refill   aspirin EC 81 MG tablet Take 81 mg by mouth daily at 6 PM. (1700)     Beta Carotene (VITAMIN A) 25000 UNIT capsule Take 25,000 Units by mouth daily.     carvedilol (COREG) 25 MG tablet Take 25 mg by mouth 2 (two) times daily.     cinacalcet (SENSIPAR) 60 MG tablet Take 1 tablet by mouth daily.     HUMULIN 70/30 (70-30) 100 UNIT/ML injection Inject 10-30 Units into the skin See admin instructions. 10 units before breakfast and 30 units before supper ON DIALYSIS DAYS (M-W-F), NO AM INSULIN     loperamide (IMODIUM) 2 MG capsule Take 2 mg by mouth as needed for diarrhea or loose stools (Take 1/2-1 tablet as meeded to prevent diarrhea.  Can take up to 1 tablet every 4 hours. Do not exceed 4 tablets in a day.).     losartan (COZAAR) 50 MG tablet Take 50 mg by mouth daily. (1700)     NOVOLIN 70/30 KWIKPEN (70-30) 100 UNIT/ML KwikPen Inject 10-30 Units into the skin as directed. On dialysis days     omeprazole (PRILOSEC) 40 MG capsule Take 40 mg by mouth daily.     sevelamer carbonate (RENVELA) 2.4 g PACK Take 2.4 g by mouth 3 (three) times daily with meals.     vitamin E 1000 UNIT capsule Take 1,000 Units by mouth daily. Not sure of dose; started 4/29     calcium acetate (PHOSLO) 667 MG capsule Take by mouth in the morning and at bedtime. Takes in AM (Patient not taking: Reported on 04/15/2021)     clindamycin (CLINDAGEL) 1 % gel Apply topically. (Patient not taking: Reported on 04/15/2021)     hydrOXYzine (ATARAX/VISTARIL) 25 MG tablet Take 25 mg by mouth at bedtime. (Patient not taking: Reported on 04/15/2021)     lidocaine-prilocaine (EMLA) cream Apply topically. (Patient not taking: Reported on 04/15/2021)     mupirocin ointment (BACTROBAN) 2 % Apply 1 application topically 2 (two) times daily. (Patient not taking: Reported on 04/15/2021) 22 g 1   omeprazole (PRILOSEC) 20 MG capsule Take 20 mg by mouth daily. (Patient not taking: Reported on 04/15/2021)     pantoprazole (  PROTONIX) 40 MG tablet Take 40 mg by mouth daily. (Patient not taking: Reported on 04/15/2021)     SENSIPAR 60 MG tablet Take 60 mg by mouth daily at 6 PM. 1700 (Patient not taking: Reported on 04/15/2021)  3   sildenafil (REVATIO) 20 MG tablet Take 1 tablet by mouth as directed. (Patient not taking: Reported on 04/15/2021)     Past Medical History:  Diagnosis Date   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[place]) 09/23/2020   CHF (congestive heart failure) (HCC)    Coronary artery disease    Diabetes mellitus without complication ([place])    Dialysis patient [facility])    M-W-F   GERD (gastroesophageal reflux disease)    Hypertension    Kyrle's disease    Renal disorder    on  dialysis for 4 years three times a week last was Tuesday   Secondary hyperparathyroidism of renal origin ([place])    Shortness of breath dyspnea    Type 2 diabetes mellitus with diabetic nephropathy (HCC)    Type 2 diabetes mellitus with diabetic retinopathy [facility])    Past Surgical History:  Procedure Laterality Date   A/V FISTULAGRAM Left 10/01/2017   Procedure: A/V FISTULAGRAM;  Surgeon: Algernon Huxley, MD;  Location: [place] CV LAB;  Service: Cardiovascular;  Laterality: Left;   A/V FISTULAGRAM Left 05/17/2020   Procedure: A/V FISTULAGRAM;  Surgeon: Algernon Huxley, MD;  Location: [place] [facility];  Service: Cardiovascular;  Laterality: Left;   CATARACT EXTRACTION W/PHACO Right 06/30/2018   Procedure: CATARACT EXTRACTION PHACO AND INTRAOCULAR LENS PLACEMENT (IOC)-RIGHT;  Surgeon: Eulogio Bear, MD;  Location: [facility] ORS;  Service: Ophthalmology;  Laterality: Right;  [place] 00:07.0 CDE 0.16 FLUID PACK lOT # [id number]   CATARACT EXTRACTION W/PHACO Left 12/24/2018   Procedure: CATARACT EXTRACTION PHACO AND INTRAOCULAR LENS PLACEMENT (IOC);  Surgeon: Eulogio Bear, MD;  Location: [facility] ORS;  Service: Ophthalmology;  Laterality: Left;  [place] 00:30.0 CDE 2.01 FLUID PACK LOT # [id number]   COLONOSCOPY WITH PROPOFOL N/A 04/23/2017   Procedure: COLONOSCOPY WITH PROPOFOL;  Surgeon: [name], MD;  Location: [facility] ENDOSCOPY;  Service: Endoscopy;  Laterality: N/A;   COLONOSCOPY WITH PROPOFOL N/A 07/02/2017   Procedure: COLONOSCOPY WITH PROPOFOL;  Surgeon: [name], MD;  Location: [facility] ENDOSCOPY;  Service: Endoscopy;  Laterality: N/A;   DIALYSIS/PERMA CATHETER INSERTION N/A 09/27/2020   Procedure: DIALYSIS/PERMA CATHETER INSERTION;  Surgeon: Algernon Huxley, MD;  Location: [place] CV LAB;  Service: Cardiovascular;  Laterality: N/A;   DIALYSIS/PERMA CATHETER REMOVAL N/A 11/01/2020   Procedure: DIALYSIS/PERMA CATHETER REMOVAL;  Surgeon: Algernon Huxley, MD;  Location: [place] CV LAB;   Service: Cardiovascular;  Laterality: N/A;   ESOPHAGOGASTRODUODENOSCOPY (EGD) WITH PROPOFOL N/A 04/23/2017   Procedure: ESOPHAGOGASTRODUODENOSCOPY (EGD) WITH PROPOFOL;  Surgeon: [name], MD;  Location: [facility] ENDOSCOPY;  Service: Endoscopy;  Laterality: N/A;   ESOPHAGOGASTRODUODENOSCOPY (EGD) WITH PROPOFOL N/A 03/27/2020   Procedure: ESOPHAGOGASTRODUODENOSCOPY (EGD) WITH PROPOFOL;  Surgeon: Lesly Rubenstein, MD;  Location: [facility] ENDOSCOPY;  Service: Endoscopy;  Laterality: N/A;   EYE SURGERY     RETINA   INSERTION OF DIALYSIS CATHETER Right 08/09/2020   Procedure: INSERTION OF DIALYSIS CATHETER;  Surgeon: Algernon Huxley, MD;  Location: [facility] ORS;  Service: Vascular;  Laterality: Right;   PERIPHERAL VASCULAR CATHETERIZATION N/A 05/07/2015   Procedure: A/V Shuntogram/Fistulagram;  Surgeon: Katha Cabal, MD;  Location: [place] [facility] LAB;  Service: Cardiovascular;  Laterality: N/A;   PERIPHERAL VASCULAR CATHETERIZATION Left 05/07/2015  Procedure: A/V Shunt Intervention;  Surgeon: Katha Cabal, MD;  Location: [place] CV LAB;  Service: Cardiovascular;  Laterality: Left;   PERIPHERAL VASCULAR THROMBECTOMY Left 07/16/2020   Procedure: PERIPHERAL VASCULAR THROMBECTOMY;  Surgeon: Algernon Huxley, MD;  Location: [place] CV LAB;  Service: Cardiovascular;  Laterality: Left;   PERIPHERAL VASCULAR THROMBECTOMY Left 08/27/2020   Procedure: PERIPHERAL VASCULAR THROMBECTOMY;  Surgeon: Algernon Huxley, MD;  Location: [place] [facility] LAB;  Service: Cardiovascular;  Laterality: Left;   REVISON OF ARTERIOVENOUS FISTULA Left 08/09/2020   Procedure: REVISON OF ARTERIOVENOUS FISTULA;  Surgeon: Algernon Huxley, MD;  Location: [facility] ORS;  Service: Vascular;  Laterality: Left;   TEE WITHOUT CARDIOVERSION N/A 07/05/2015   Procedure: TRANSESOPHAGEAL ECHOCARDIOGRAM (TEE);  Surgeon: Dionisio David, MD;  Location: [facility] ORS;  Service: Cardiovascular;  Laterality: N/A;   UPPER EXTREMITY ANGIOGRAPHY Left 06/24/2018    Procedure: UPPER EXTREMITY ANGIOGRAPHY;  Surgeon: Algernon Huxley, MD;  Location: [place] [facility] LAB;  Service: Cardiovascular;  Laterality: Left;   WOUND DEBRIDEMENT N/A 09/25/2020   Procedure: DEBRIDEMENT WOUND;  Surgeon: Katha Cabal, MD;  Location: [facility] ORS;  Service: Vascular;  Laterality: N/A;   Social History Social History   Tobacco Use   Smoking status: Never   Smokeless tobacco: Never  Vaping Use   Vaping Use: Never used  Substance Use Topics   Alcohol use: Not Currently    Comment: occ. shot   Drug use: No   Family History Family History  Problem Relation Age of Onset   Diabetes Father   Patient denies family history of peripheral artery disease, venous disease or renal disease.  Allergies  Allergen Reactions   Cephalexin Nausea Only   Ivp Dye [Iodinated Diagnostic Agents] Diarrhea   Lisinopril Other (See Comments)    Pt cannot recall reaction, cough   Other    REVIEW OF SYSTEMS (Negative unless checked)  Constitutional: [] Weight loss  [] Fever  [] Chills Cardiac: [] Chest pain   [] Chest pressure   [] Palpitations   [] Shortness of breath when laying flat   [] Shortness of breath at rest   [x] Shortness of breath with exertion. Vascular:  [] Pain in legs with walking   [] Pain in legs at rest   [] Pain in legs when laying flat   [] Claudication   [] Pain in feet when walking  [] Pain in feet at rest  [] Pain in feet when laying flat   [] History of DVT   [] Phlebitis   [x] Swelling in legs   [] Varicose veins   [] Non-healing ulcers Pulmonary:   [] Uses home oxygen   [] Productive cough   [] Hemoptysis   [] Wheeze  [] COPD   [] Asthma Neurologic:  [] Dizziness  [] Blackouts   [] Seizures   [] History of stroke   [] History of TIA  [] Aphasia   [] Temporary blindness   [] Dysphagia   [] Weakness or numbness in arms   [] Weakness or numbness in legs Musculoskeletal:  [] Arthritis   [] Joint swelling   [] Joint pain   [] Low back pain Hematologic:  [] Easy bruising  [] Easy bleeding   [] Hypercoagulable  state   [] Anemic  [] Hepatitis Gastrointestinal:  [] Blood in stool   [] Vomiting blood  [] Gastroesophageal reflux/heartburn   [] Difficulty swallowing. Genitourinary:  [x] Chronic kidney disease   [] Difficult urination  [] Frequent urination  [] Burning with urination   [] Blood in urine Skin:  [] Rashes   [] Ulcers   [] Wounds Psychological:  [] History of anxiety   []  History of major depression.  Physical Examination  Vitals:   04/15/21 0748 04/15/21 0854 04/15/21 1100  04/15/21 1200  BP: 131/90 (!) 165/117 (!) 167/91 (!) 145/99  Pulse: 79 78 78 72  Resp: 18 18  20   Temp: 98.9 F (37.2 C) 98.9 F (37.2 C)    TempSrc: Oral Oral    SpO2: 99% 100% 98% 100%  Weight:      Height:       Body mass index is 37.84 kg/m. Gen:  WD/WN, NAD Head: [place]/AT, No temporalis wasting. Prominent temp pulse not noted. Ear/Nose/Throat: Hearing grossly intact, nares w/o erythema or drainage, oropharynx w/o Erythema/Exudate Eyes: Sclera non-icteric, conjunctiva clear Neck: Trachea midline.  No JVD.  Pulmonary:  Good air movement, respirations not labored, equal bilaterally.  Cardiac: RRR, normal S1, S2. Vascular:  Vessel Right Left  Radial Palpable Palpable  Ulnar Palpable Palpable   Left upper extremity: Pulsatile at the anastomosis.  Hard to palpate a thrill more proximally.  Hand is warm.  Motor/sensory is intact.  Gastrointestinal: soft, non-tender/non-distended. No guarding/reflex.  Musculoskeletal: M/S 5/5 throughout.  Extremities without ischemic changes.  No deformity or atrophy. No edema. Neurologic: Sensation grossly intact in extremities.  Symmetrical.  Speech is fluent. Motor exam as listed above. Psychiatric: Judgment intact, Mood & affect appropriate for pt's clinical situation. Dermatologic: No rashes or ulcers noted.  No cellulitis or open wounds. Lymph : No Cervical, Axillary, or Inguinal lymphadenopathy.  CBC Lab Results  Component Value Date   WBC 9.9 04/15/2021   HGB 11.8 (L)  04/15/2021   HCT 37.9 (L) 04/15/2021   MCV 87.9 04/15/2021   PLT 170 04/15/2021   BMET    Component Value Date/Time   NA 135 04/15/2021 0801   NA 138 02/24/2014 0359   K 5.6 (H) 04/15/2021 0801   K 3.5 02/24/2014 0359   CL 99 04/15/2021 0801   CL 97 (L) 02/24/2014 0359   CO2 23 04/15/2021 0801   CO2 31 02/24/2014 0359   GLUCOSE 169 (H) 04/15/2021 0801   GLUCOSE 231 (H) 02/24/2014 0359   BUN 74 (H) 04/15/2021 0801   BUN 38 (H) 02/24/2014 0359   CREATININE 16.37 (H) 04/15/2021 0801   CREATININE 12.04 (H) 02/24/2014 0359   CALCIUM 7.3 (L) 04/15/2021 0801   CALCIUM 7.9 (L) 02/24/2014 0359   GFRNONAA 3 (L) 04/15/2021 0801   GFRNONAA 5 (L) 02/24/2014 0359   GFRAA 3 (L) 05/07/2015 0937   GFRAA 6 (L) 02/24/2014 0359   Estimated Creatinine Clearance: 7.9 mL/min (A) (by C-G formula based on SCr of 16.37 mg/dL (H)).  COAG Lab Results  Component Value Date   INR 1.1 08/09/2020   INR 1.28 06/28/2015   INR 1.04 05/07/2015   Radiology No results found.  Assessment/Plan The patient is a 45 year old male with multiple medical issues who was sent to the [facility] emergency department with a poorly functioning left upper extremity dialysis access  1.  End-stage renal disease: Patient with a known history of end-stage renal disease who has required multiple interventions to the left upper extremity in the past.  Patient was sent to our emergency department after dialysis staff were unable to cannulate this AM.  Patient states progressively worsening dysfunction of his access.  Recommend undergoing a left upper extremity shuntogram with possible intervention and attempt to restore function to his access.  Procedure, risks and benefits were explained to the patient and his mother who is at the bedside.  All questions were answered.  Patient wishes to proceed.  We will plan on this tomorrow with Dr. Delana Meyer.  Patient is blind in one eye   2.  Diabetes: On  appropriate medications Encouraged good control as its slows the progression of atherosclerotic disease   3.  Hypertension: On appropriate medications Encouraged good control as its slows the progression of atherosclerotic disease   Discussed with Dr. Francene Castle, PA-C 04/15/2021 3:57 PM  This note was created with Dragon medical transcription system.  Any error is purely unintentional.

## 2021-04-15 NOTE — Assessment & Plan Note (Signed)
-  Continue insulin ?

## 2021-04-15 NOTE — Progress Notes (Signed)
Notified by nephrology that they were unable to get patient into outpatient vascular center for declot procedure. Still awaiting to hear from vascular surgery. NPO until vascular surgery makes decision if they are able to declot his AV fistula today.

## 2021-04-15 NOTE — ED Triage Notes (Signed)
Pt to ED via POV, states sent by dialysis for fistula problems. Pt states they attempted to access fistula 3 times at dialysis today and were unable to do so, states staff told him, "it wasn't buzzing". Pt states is a MWF dialysis, last dialysis was on Friday, fistula to LUE. Pt states he feels like he is full of fluid, states today weighed 103.1kg and his dry weight is 125kg.

## 2021-04-15 NOTE — ED Notes (Signed)
Says last dialysis was Friday.  Has access left upper arm.  No thrill felt.  Can feel pulse in AC area.  It is sore --patient says they stuck him a few times today trying to access.  Patient in nad.

## 2021-04-15 NOTE — Progress Notes (Signed)
[facility]  ROUNDING NOTE   Subjective:   [name] [name] is a 45 y.o. male with past medical history of CHF, diabetes, GERD, hypertension, anemia and end stage renal disease on dialysis.  Patient presents to the emergency room with complaints of a clotted HD access. Patient admitted for AV fistula occlusion [facility]) [place]  ED physician requesting vascular intervention.  Patient states last treatment was on Friday, no complications.  Patient states he has had no complications out of this access.  Currently denies nausea, vomiting, and diarrhea.  Denies shortness of breath and discomfort.  Only complaints currently are of hunger.  Pertinent labs on arrival include potassium 5.6, creatinine 16.37 with GFR 3.   Objective:  Vital signs in last 24 hours:  Temp:  [98.9 F (37.2 C)] 98.9 F (37.2 C) (12/19 0854) Pulse Rate:  [72-79] 72 (12/19 1200) Resp:  [18-20] 20 (12/19 1200) BP: (131-167)/(90-117) 145/99 (12/19 1200) SpO2:  [98 %-100 %] 100 % (12/19 1200) Weight:  [130.1 kg] 130.1 kg (12/19 0736)  Weight change:  Filed Weights   04/15/21 0736  Weight: 130.1 kg    Intake/Output: No intake/output data recorded.   Intake/Output this shift:  No intake/output data recorded.  Physical Exam: General: NAD, resting in bed  Head: Normocephalic, atraumatic. Moist oral mucosal membranes  Eyes: Anicteric  Lungs:  Clear to auscultation, normal effort  Heart: Regular rate and rhythm  Abdomen:  Soft, nontender,   Extremities:  no peripheral edema.  Neurologic: Nonfocal, moving all four extremities  Skin: No lesions  Access: LUE AVF (no bruit/thrill)    Basic Metabolic Panel: Recent Labs  Lab 04/15/21 0801  NA 135  K 5.6*  CL 99  CO2 23  GLUCOSE 169*  BUN 74*  CREATININE 16.37*  CALCIUM 7.3*    Liver Function Tests: Recent Labs  Lab 04/15/21 0801  AST 14*  ALT 11  ALKPHOS 106  BILITOT 0.6  PROT 7.7  ALBUMIN 3.6   No results for input(s): LIPASE,  AMYLASE in the last 168 hours. No results for input(s): AMMONIA in the last 168 hours.  CBC: Recent Labs  Lab 04/15/21 0801  WBC 9.9  NEUTROABS 7.3  HGB 11.8*  HCT 37.9*  MCV 87.9  PLT 170    Cardiac Enzymes: No results for input(s): CKTOTAL, CKMB, CKMBINDEX, TROPONINI in the last 168 hours.  BNP: Invalid input(s): POCBNP  CBG: No results for input(s): GLUCAP in the last 168 hours.  Microbiology: Results for orders placed or performed during the hospital encounter of 09/23/20  Aerobic Culture w Gram Stain (superficial specimen)     Status: None   Collection Time: 09/23/20  1:25 PM   Specimen: Hemodialysis Fistula  Result Value Ref Range Status   Specimen Description   Final    HEMODIALYSIS FISTULA Performed at [facility], [address], [place], [place] [address]    Special Requests   Final    NONE Performed at [facility], [address] [place], [place] [address]    Gram Stain   Final    ABUNDANT WBC PRESENT, PREDOMINANTLY PMN RARE GRAM POSITIVE COCCI    Culture   Final    RARE NORMAL SKIN FLORA NO GROUP A STREP (S.PYOGENES) ISOLATED NO STAPHYLOCOCCUS AUREUS ISOLATED Performed at [facility], [place] [address], [place], [place] [address]    Report Status 09/26/2020 FINAL  Final  SARS CORONAVIRUS 2 (TAT 6-24 HRS) Nasopharyngeal Nasopharyngeal Swab     Status: None  Collection Time: 09/23/20  1:30 PM   Specimen: Nasopharyngeal Swab  Result Value Ref Range Status   SARS Coronavirus 2 NEGATIVE NEGATIVE Final    Comment: (NOTE) SARS-CoV-2 target nucleic acids are NOT DETECTED.  The SARS-CoV-2 RNA is generally detectable in upper and lower respiratory specimens during the acute phase of infection. Negative results do not preclude SARS-CoV-2 infection, do not rule out co-infections with other pathogens, and should not be used as the sole basis for treatment or other patient management decisions. Negative results must be combined  with clinical observations, patient history, and epidemiological information. The expected result is Negative.  Fact Sheet for Patients: SugarRoll.be  Fact Sheet for Healthcare Providers: https://www.woods-mathews.com/  This test is not yet approved or cleared by the [place] FDA and  has been authorized for detection and/or diagnosis of SARS-CoV-2 by FDA under an Emergency Use Authorization (EUA). This EUA will remain  in effect (meaning this test can be used) for the duration of the COVID-19 declaration under Se ction 564(b)(1) of the Act, 21 U.S.C. section 360bbb-3(b)(1), unless the authorization is terminated or revoked sooner.  Performed at [facility], [place] [address] [place], [place] [address]   MRSA PCR Screening     Status: None   Collection Time: 09/23/20  9:15 PM   Specimen: Nasopharyngeal  Result Value Ref Range Status   MRSA by PCR NEGATIVE NEGATIVE Final    Comment:        The GeneXpert MRSA Assay (FDA approved for NASAL specimens only), is one component of a comprehensive MRSA colonization surveillance program. It is not intended to diagnose MRSA infection nor to guide or monitor treatment for MRSA infections. Performed at [facility], [address] [place], [place] [address]     Coagulation Studies: No results for input(s): LABPROT, INR in the last 72 hours.  Urinalysis: No results for input(s): COLORURINE, LABSPEC, PHURINE, GLUCOSEU, HGBUR, BILIRUBINUR, KETONESUR, PROTEINUR, UROBILINOGEN, NITRITE, LEUKOCYTESUR in the last 72 hours.  Invalid input(s): APPERANCEUR    Imaging: No results found.   Medications:     patiromer  16.8 g Oral Daily     Assessment/ Plan:  [name] is a 45 y.o.  male with past medical history of CHF, diabetes, GERD, hypertension, anemia and end stage renal disease on dialysis.  Patient presents to the emergency room with complaints of a  clotted HD access. Patient admitted for 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[place]) [T82.898A]  [facility]/MWF/ LUE AVF  Hyperkalemia with end stage renal disease  Potassium 5.6, ordered [place]  Next treatment scheduled for after vascular intervention.   Anemia of chronic kidney disease  Lab Results  Component Value Date   HGB 11.8 (L) 04/15/2021  Hgb within target  3. Secondary Hyperparathyroidism: Lab Results  Component Value Date   CALCIUM 7.3 (L) 04/15/2021   PHOS 7.9 (H) 09/24/2020    Calcium and phosphorus out of acceptable range.   4. AVF occlusion  Contacted vascular for evaluation   LOS: 0 Yanis Larin 12/19/20222:51 PM

## 2021-04-15 NOTE — Assessment & Plan Note (Signed)
Assigned observation medical bed.  Keep the patient n.p.o. until seen by surgery.  Keep him n.p.o. if he is able to go to surgery today for AV fistula declot.  If declotting procedure is not scheduled until tomorrow, then he may eat today then n.p.o. after midnight.  Discussed with the patient and his mother normal and that patient may need temporary hemodialysis catheter if becomes volume overloaded, has shortness of breath or needs emergent dialysis.

## 2021-04-15 NOTE — Subjective & Objective (Signed)
CC: clotted AV fistula HPI: 45 yo AAM with hx of type 2 DM on insulin, end-stage renal disease on hemodialysis on Monday Wednesday Friday via left upper extremity AV fistula, hypertension presents to the ER today from the dialysis unit.  Dialysis unit unable to access his left upper extremity AV fistula despite multiple attempts.  Patient sent to the ER for evaluation. Patient denies any fever chills, nausea vomiting, diarrhea.  No chest pain.  No shortness of breath.  EDP is contacted nephrology and vascular surgery.  [facility] hospitalist contacted for admission.

## 2021-04-15 NOTE — Assessment & Plan Note (Signed)
Continue aspirin.  Continue beta-blocker.

## 2021-04-15 NOTE — ED Provider Notes (Addendum)
[facility] Emergency Department Provider Note  Time seen: 8:17 AM  I have reviewed the triage vital signs and the nursing notes.   HISTORY  Chief Complaint Vascular Access Problem   HPI [name] [name] is a 45 y.o. male with a past medical history of ESRD on HD Monday/Wednesday/Friday, hypertension, diabetes, CHF, presents to the emergency department for a clogged left AV fistula.  According to the patient he receives dialysis Monday/Wednesday/Friday.  Last went to dialysis on Friday.  He went to dialysis this morning and they were unable to access his left upper extremity AV fistula so they sent the patient to the emergency department for evaluation.  Patient denies any pain in the upper extremity.  Denies any shortness of breath.  Largely negative review of systems.   Past Medical History:  Diagnosis Date   CHF (congestive heart failure) (HCC)    Coronary artery disease    Diabetes mellitus without complication ([place])    Dialysis patient [facility])    M-W-F   GERD (gastroesophageal reflux disease)    Hypertension    Kyrle's disease    Renal disorder    on dialysis for 4 years three times a week last was Tuesday   Secondary hyperparathyroidism of renal origin [facility])    Shortness of breath dyspnea    Type 2 diabetes mellitus with diabetic nephropathy (HCC)    Type 2 diabetes mellitus with diabetic retinopathy [facility])     Patient Active Problem List   Diagnosis Date Noted   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[place]) 09/23/2020   Coronary artery disease    Obesity, Class III, BMI 40-49.9 (morbid obesity) ([place])    Chronic venous insufficiency 02/17/2017   Hypertension 07/24/2016   Venous ulcer of left leg ([place]) 07/24/2016   Uncontrolled type 2 diabetes mellitus with hyperglycemia, with long-term current use of insulin ([place]) 11/27/2015   End stage renal disease ([place]) 11/02/2014   Diabetes mellitus with ESRD (end-stage renal disease) ([place]) 11/02/2014    Past Surgical History:   Procedure Laterality Date   A/V FISTULAGRAM Left 10/01/2017   Procedure: A/V FISTULAGRAM;  Surgeon: Algernon Huxley, MD;  Location: [place] [facility];  Service: Cardiovascular;  Laterality: Left;   A/V FISTULAGRAM Left 05/17/2020   Procedure: A/V FISTULAGRAM;  Surgeon: Algernon Huxley, MD;  Location: [place] [facility];  Service: Cardiovascular;  Laterality: Left;   CATARACT EXTRACTION W/PHACO Right 06/30/2018   Procedure: CATARACT EXTRACTION PHACO AND INTRAOCULAR LENS PLACEMENT (IOC)-RIGHT;  Surgeon: Eulogio Bear, MD;  Location: [facility] ORS;  Service: Ophthalmology;  Laterality: Right;  [place] 00:07.0 CDE 0.16 FLUID PACK lOT # [id number]   CATARACT EXTRACTION W/PHACO Left 12/24/2018   Procedure: CATARACT EXTRACTION PHACO AND INTRAOCULAR LENS PLACEMENT (IOC);  Surgeon: Eulogio Bear, MD;  Location: [facility] ORS;  Service: Ophthalmology;  Laterality: Left;  [place] 00:30.0 CDE 2.01 FLUID PACK LOT # [id number]   COLONOSCOPY WITH PROPOFOL N/A 04/23/2017   Procedure: COLONOSCOPY WITH PROPOFOL;  Surgeon: [name], MD;  Location: [facility] ENDOSCOPY;  Service: Endoscopy;  Laterality: N/A;   COLONOSCOPY WITH PROPOFOL N/A 07/02/2017   Procedure: COLONOSCOPY WITH PROPOFOL;  Surgeon: [name], MD;  Location: [facility] ENDOSCOPY;  Service: Endoscopy;  Laterality: N/A;   DIALYSIS/PERMA CATHETER INSERTION N/A 09/27/2020   Procedure: DIALYSIS/PERMA CATHETER INSERTION;  Surgeon: Algernon Huxley, MD;  Location: [place] [facility];  Service: Cardiovascular;  Laterality: N/A;   DIALYSIS/PERMA CATHETER REMOVAL N/A 11/01/2020   Procedure: DIALYSIS/PERMA CATHETER REMOVAL;  Surgeon:  Algernon Huxley, MD;  Location: [facility];  Service: Cardiovascular;  Laterality: N/A;   ESOPHAGOGASTRODUODENOSCOPY (EGD) WITH PROPOFOL N/A 04/23/2017   Procedure: ESOPHAGOGASTRODUODENOSCOPY (EGD) WITH PROPOFOL;  Surgeon: [name], MD;  Location: [facility] ENDOSCOPY;  Service: Endoscopy;  Laterality: N/A;   ESOPHAGOGASTRODUODENOSCOPY  (EGD) WITH PROPOFOL N/A 03/27/2020   Procedure: ESOPHAGOGASTRODUODENOSCOPY (EGD) WITH PROPOFOL;  Surgeon: Lesly Rubenstein, MD;  Location: [facility] ENDOSCOPY;  Service: Endoscopy;  Laterality: N/A;   EYE SURGERY     RETINA   INSERTION OF DIALYSIS CATHETER Right 08/09/2020   Procedure: INSERTION OF DIALYSIS CATHETER;  Surgeon: Algernon Huxley, MD;  Location: [facility] ORS;  Service: Vascular;  Laterality: Right;   PERIPHERAL VASCULAR CATHETERIZATION N/A 05/07/2015   Procedure: A/V Shuntogram/Fistulagram;  Surgeon: Katha Cabal, MD;  Location: [place] [facility];  Service: Cardiovascular;  Laterality: N/A;   PERIPHERAL VASCULAR CATHETERIZATION Left 05/07/2015   Procedure: A/V Shunt Intervention;  Surgeon: Katha Cabal, MD;  Location: [place] [facility];  Service: Cardiovascular;  Laterality: Left;   PERIPHERAL VASCULAR THROMBECTOMY Left 07/16/2020   Procedure: PERIPHERAL VASCULAR THROMBECTOMY;  Surgeon: Algernon Huxley, MD;  Location: [place] [facility];  Service: Cardiovascular;  Laterality: Left;   PERIPHERAL VASCULAR THROMBECTOMY Left 08/27/2020   Procedure: PERIPHERAL VASCULAR THROMBECTOMY;  Surgeon: Algernon Huxley, MD;  Location: [place] [facility];  Service: Cardiovascular;  Laterality: Left;   REVISON OF ARTERIOVENOUS FISTULA Left 08/09/2020   Procedure: REVISON OF ARTERIOVENOUS FISTULA;  Surgeon: Algernon Huxley, MD;  Location: [facility] ORS;  Service: Vascular;  Laterality: Left;   TEE WITHOUT CARDIOVERSION N/A 07/05/2015   Procedure: TRANSESOPHAGEAL ECHOCARDIOGRAM (TEE);  Surgeon: Dionisio David, MD;  Location: [facility] ORS;  Service: Cardiovascular;  Laterality: N/A;   UPPER EXTREMITY ANGIOGRAPHY Left 06/24/2018   Procedure: UPPER EXTREMITY ANGIOGRAPHY;  Surgeon: Algernon Huxley, MD;  Location: [place] [facility];  Service: Cardiovascular;  Laterality: Left;   WOUND DEBRIDEMENT N/A 09/25/2020   Procedure: DEBRIDEMENT WOUND;  Surgeon: Katha Cabal, MD;  Location: [facility] ORS;  Service: Vascular;   Laterality: N/A;    Prior to Admission medications   Medication Sig Start Date End Date Taking? Authorizing Provider  aspirin EC 81 MG tablet Take 81 mg by mouth daily at 6 PM. (1700) 01/14/16   [provider]  Beta Carotene (VITAMIN A) 25000 UNIT capsule Take 25,000 Units by mouth daily.    [provider]  calcium acetate (PHOSLO) 667 MG capsule Take by mouth in the morning and at bedtime. Takes in AM    [provider]  carvedilol (COREG) 12.5 MG tablet Take 12.5 mg by mouth 2 (two) times daily with a meal.     [provider]  clindamycin (CLINDAGEL) 1 % gel Apply topically. 08/24/20   [provider]  HUMULIN 70/30 (70-30) 100 UNIT/ML injection Inject 10-30 Units into the skin See admin instructions. 10 units before breakfast and 30 units before supper ON DIALYSIS DAYS (M-W-F), NO AM INSULIN 04/19/15   [provider]  hydrOXYzine (ATARAX/VISTARIL) 25 MG tablet Take 25 mg by mouth at bedtime. 08/23/20   [provider]  lidocaine-prilocaine (EMLA) cream Apply topically. 08/27/20 08/27/21  [provider]  loperamide (IMODIUM) 2 MG capsule Take 2 mg by mouth as needed for diarrhea or loose stools (Take 1/2-1 tablet as meeded to prevent diarrhea. Can take up to 1 tablet every 4 hours. Do not exceed 4 tablets in a day.).  [provider]  losartan (COZAAR) 50 MG tablet Take 50 mg by mouth daily. (1700) 05/22/18   [provider]  mupirocin ointment (BACTROBAN) 2 % Apply 1 application topically 2 (two) times daily. 08/23/20   Kris Hartmann, NP  omeprazole (PRILOSEC) 20 MG capsule Take 20 mg by mouth daily. 03/01/20 03/01/21  [provider]  oxyCODONE-acetaminophen (PERCOCET/ROXICET) 5-325 MG tablet Take 1 tablet by mouth every 6 (six) hours as needed for severe pain. 08/23/20   Kris Hartmann, NP  pantoprazole (PROTONIX) 40 MG tablet Take 40 mg by mouth daily. 01/08/21   [provider]   SENSIPAR 60 MG tablet Take 60 mg by mouth daily at 6 PM. 1700 04/28/15   [provider]  sevelamer carbonate (RENVELA) 2.4 g PACK  02/27/20   [provider]  vitamin E 1000 UNIT capsule Take 1,000 Units by mouth daily. Not sure of dose; started 4/29    [provider]    Allergies  Allergen Reactions   Cephalexin Nausea Only   Ivp Dye [Iodinated Diagnostic Agents] Diarrhea   Lisinopril Other (See Comments)    Pt cannot recall reaction, cough   Other     Family History  Problem Relation Age of Onset   Diabetes Father     Social History Social History   Tobacco Use   Smoking status: Never   Smokeless tobacco: Never  Vaping Use   Vaping Use: Never used  Substance Use Topics   Alcohol use: Not Currently    Comment: occ. shot   Drug use: No    Review of Systems Constitutional: Negative for fever. Cardiovascular: Negative for chest pain. Respiratory: Negative for shortness of breath. Gastrointestinal: Negative for abdominal pain, vomiting  Musculoskeletal: Left upper extremity AV fistula is not working. Neurological: Negative for headache All other ROS negative  ____________________________________________   PHYSICAL EXAM:  VITAL SIGNS: ED Triage Vitals  Enc Vitals Group     BP 04/15/21 0748 131/90     Pulse Rate 04/15/21 0748 79     Resp 04/15/21 0748 18     Temp 04/15/21 0748 98.9 F (37.2 C)     Temp Source 04/15/21 0748 Oral     SpO2 04/15/21 0748 99 %     Weight 04/15/21 0736 286 lb 13.1 oz (130.1 kg)     Height 04/15/21 0736 6\' 1"  (1.854 m)     Head Circumference --      Peak Flow --      Pain Score 04/15/21 0736 0     Pain Loc --      Pain Edu? --      Excl. in [place]? --    Constitutional: Alert and oriented. Well appearing and in no distress. Eyes: Normal exam ENT      Head: Normocephalic and atraumatic.      Mouth/Throat: Mucous membranes are moist. Cardiovascular: Normal rate, regular rhythm.  Respiratory: Normal  respiratory effort without tachypnea nor retractions. Breath sounds are clear Gastrointestinal: Soft and nontender. No distention.   Musculoskeletal: No thrill over left upper extremity AV fistula.  Nontender.  Extremities distally warm and well-perfused. Neurologic:  Normal speech and language. No gross focal neurologic deficits  Skin:  Skin is warm, dry and intact.  Psychiatric: Mood and affect are normal.   ____________________________________________   INITIAL IMPRESSION / ASSESSMENT AND PLAN / ED COURSE  Pertinent labs & imaging results that were available during my care of the patient were reviewed by me and  considered in my medical decision making (see chart for details).   Patient presents to the emergency department for evaluation of his left AV fistula that does not appear to be working.  We will check labs and discussed with vascular surgery.  Patient agreeable to plan of care.  Patient's potassium is 5.6.  I spoke to Dr. Holley Raring of nephrology who recommends 16.8 g of [place].  I have consulted Dr. Delana Meyer of vascular surgery who is going to review the patient's chart to see if he needs a declotting procedure today versus a temporary catheter placed.  Patient is now n.p.o., states his last oral intake was 3 AM.  Patient will be admitted to the hospitalist service.  Jemari Hallum Kallen was evaluated in Emergency Department on 04/15/2021 for the symptoms described in the history of present illness. He was evaluated in the context of the global COVID-19 pandemic, which necessitated consideration that the patient might be at risk for infection with the SARS-CoV-2 virus that causes COVID-19. Institutional protocols and algorithms that pertain to the evaluation of patients at risk for COVID-19 are in a state of rapid change based on information released by regulatory bodies including the CDC and federal and state organizations. These policies and algorithms were followed during the patient's  care in the ED.  ____________________________________________   FINAL CLINICAL IMPRESSION(S) / ED DIAGNOSES  Nonworking AV fistula   Harvest Dark, MD 04/15/21 [id number]    Harvest Dark, MD 04/15/21 1326

## 2021-04-15 NOTE — Assessment & Plan Note (Signed)
Nephrologist notified of patient's admission by EDP.

## 2021-04-15 NOTE — H&P (Signed)
History and Physical    [name] [name] VFI:[id number] DOB: 03/13/1976 DOA: 04/15/2021  PCP: Danelle Berry, NP   Patient coming from: Home  I have personally briefly reviewed patient's old medical records in [place]  CC: clotted AV fistula HPI: 45 yo AAM with hx of type 2 DM on insulin, end-stage renal disease on hemodialysis on Monday Wednesday Friday via left upper extremity AV fistula, hypertension presents to the ER today from the dialysis unit.  Dialysis unit unable to access his left upper extremity AV fistula despite multiple attempts.  Patient sent to the ER for evaluation. Patient denies any fever chills, nausea vomiting, diarrhea.  No chest pain.  No shortness of breath.  EDP is contacted nephrology and vascular surgery.  [facility] hospitalist contacted for admission.   ED Course: stable in ER. Given veltassa for k+ of 5.6  Review of Systems:  Review of Systems  Constitutional: Negative.  Negative for chills, fever, malaise/fatigue and weight loss.  HENT: Negative.    Eyes: Negative.   Respiratory: Negative.  Negative for cough and shortness of breath.   Cardiovascular: Negative.  Negative for chest pain, palpitations and orthopnea.  Gastrointestinal: Negative.  Negative for heartburn, nausea and vomiting.  Genitourinary: Negative.   Musculoskeletal: Negative.        No thrill or bruit felt in the left upper extremity fistula at hemodialysis today.  Skin: Negative.   Neurological: Negative.   Endo/Heme/Allergies: Negative.   Psychiatric/Behavioral: Negative.    All other systems reviewed and are negative.  Past Medical History:  Diagnosis Date   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[place]) 09/23/2020   CHF (congestive heart failure) (HCC)    Coronary artery disease    Diabetes mellitus without complication ([place])    Dialysis patient [facility])    M-W-F   GERD (gastroesophageal reflux disease)    Hypertension    Kyrle's disease    Renal disorder    on dialysis for 4 years  three times a week last was Tuesday   Secondary hyperparathyroidism of renal origin ([place])    Shortness of breath dyspnea    Type 2 diabetes mellitus with diabetic nephropathy (HCC)    Type 2 diabetes mellitus with diabetic retinopathy [facility])     Past Surgical History:  Procedure Laterality Date   A/V FISTULAGRAM Left 10/01/2017   Procedure: A/V FISTULAGRAM;  Surgeon: Algernon Huxley, MD;  Location: [place] CV LAB;  Service: Cardiovascular;  Laterality: Left;   A/V FISTULAGRAM Left 05/17/2020   Procedure: A/V FISTULAGRAM;  Surgeon: Algernon Huxley, MD;  Location: [place] CV LAB;  Service: Cardiovascular;  Laterality: Left;   CATARACT EXTRACTION W/PHACO Right 06/30/2018   Procedure: CATARACT EXTRACTION PHACO AND INTRAOCULAR LENS PLACEMENT (IOC)-RIGHT;  Surgeon: Eulogio Bear, MD;  Location: [facility] ORS;  Service: Ophthalmology;  Laterality: Right;  [place] 00:07.0 CDE 0.16 FLUID PACK lOT # [id number]   CATARACT EXTRACTION W/PHACO Left 12/24/2018   Procedure: CATARACT EXTRACTION PHACO AND INTRAOCULAR LENS PLACEMENT (IOC);  Surgeon: Eulogio Bear, MD;  Location: [facility] ORS;  Service: Ophthalmology;  Laterality: Left;  [place] 00:30.0 CDE 2.01 FLUID PACK LOT # [id number]   COLONOSCOPY WITH PROPOFOL N/A 04/23/2017   Procedure: COLONOSCOPY WITH PROPOFOL;  Surgeon: [name], MD;  Location: [facility] ENDOSCOPY;  Service: Endoscopy;  Laterality: N/A;   COLONOSCOPY WITH PROPOFOL N/A 07/02/2017   Procedure: COLONOSCOPY WITH PROPOFOL;  Surgeon: [name], MD;  Location: [facility] ENDOSCOPY;  Service: Endoscopy;  Laterality: N/A;   DIALYSIS/PERMA  CATHETER INSERTION N/A 09/27/2020   Procedure: DIALYSIS/PERMA CATHETER INSERTION;  Surgeon: Algernon Huxley, MD;  Location: [place] CV LAB;  Service: Cardiovascular;  Laterality: N/A;   DIALYSIS/PERMA CATHETER REMOVAL N/A 11/01/2020   Procedure: DIALYSIS/PERMA CATHETER REMOVAL;  Surgeon: Algernon Huxley, MD;  Location: [place] CV LAB;  Service: Cardiovascular;   Laterality: N/A;   ESOPHAGOGASTRODUODENOSCOPY (EGD) WITH PROPOFOL N/A 04/23/2017   Procedure: ESOPHAGOGASTRODUODENOSCOPY (EGD) WITH PROPOFOL;  Surgeon: [name], MD;  Location: [facility] ENDOSCOPY;  Service: Endoscopy;  Laterality: N/A;   ESOPHAGOGASTRODUODENOSCOPY (EGD) WITH PROPOFOL N/A 03/27/2020   Procedure: ESOPHAGOGASTRODUODENOSCOPY (EGD) WITH PROPOFOL;  Surgeon: Lesly Rubenstein, MD;  Location: [facility] ENDOSCOPY;  Service: Endoscopy;  Laterality: N/A;   EYE SURGERY     RETINA   INSERTION OF DIALYSIS CATHETER Right 08/09/2020   Procedure: INSERTION OF DIALYSIS CATHETER;  Surgeon: Algernon Huxley, MD;  Location: [facility] ORS;  Service: Vascular;  Laterality: Right;   PERIPHERAL VASCULAR CATHETERIZATION N/A 05/07/2015   Procedure: A/V Shuntogram/Fistulagram;  Surgeon: Katha Cabal, MD;  Location: [place] CV LAB;  Service: Cardiovascular;  Laterality: N/A;   PERIPHERAL VASCULAR CATHETERIZATION Left 05/07/2015   Procedure: A/V Shunt Intervention;  Surgeon: Katha Cabal, MD;  Location: [place] CV LAB;  Service: Cardiovascular;  Laterality: Left;   PERIPHERAL VASCULAR THROMBECTOMY Left 07/16/2020   Procedure: PERIPHERAL VASCULAR THROMBECTOMY;  Surgeon: Algernon Huxley, MD;  Location: [place] CV LAB;  Service: Cardiovascular;  Laterality: Left;   PERIPHERAL VASCULAR THROMBECTOMY Left 08/27/2020   Procedure: PERIPHERAL VASCULAR THROMBECTOMY;  Surgeon: Algernon Huxley, MD;  Location: [place] CV LAB;  Service: Cardiovascular;  Laterality: Left;   REVISON OF ARTERIOVENOUS FISTULA Left 08/09/2020   Procedure: REVISON OF ARTERIOVENOUS FISTULA;  Surgeon: Algernon Huxley, MD;  Location: [facility] ORS;  Service: Vascular;  Laterality: Left;   TEE WITHOUT CARDIOVERSION N/A 07/05/2015   Procedure: TRANSESOPHAGEAL ECHOCARDIOGRAM (TEE);  Surgeon: Dionisio David, MD;  Location: [facility] ORS;  Service: Cardiovascular;  Laterality: N/A;   UPPER EXTREMITY ANGIOGRAPHY Left 06/24/2018   Procedure: UPPER  EXTREMITY ANGIOGRAPHY;  Surgeon: Algernon Huxley, MD;  Location: [place] CV LAB;  Service: Cardiovascular;  Laterality: Left;   WOUND DEBRIDEMENT N/A 09/25/2020   Procedure: DEBRIDEMENT WOUND;  Surgeon: Katha Cabal, MD;  Location: [facility] ORS;  Service: Vascular;  Laterality: N/A;     reports that he has never smoked. He has never used smokeless tobacco. He reports that he does not currently use alcohol. He reports that he does not use drugs.  Allergies  Allergen Reactions   Cephalexin Nausea Only   Ivp Dye [Iodinated Diagnostic Agents] Diarrhea   Lisinopril Other (See Comments)    Pt cannot recall reaction, cough   Other     Family History  Problem Relation Age of Onset   Diabetes Father     Prior to Admission medications   Medication Sig Start Date End Date Taking? Authorizing Provider  cinacalcet (SENSIPAR) 60 MG tablet Take 1 tablet by mouth daily. 03/26/21  Yes [provider]  aspirin EC 81 MG tablet Take 81 mg by mouth daily at 6 PM. (1700) 01/14/16   [provider]  Beta Carotene (VITAMIN A) 25000 UNIT capsule Take 25,000 Units by mouth daily.    [provider]  calcium acetate (PHOSLO) 667 MG capsule Take by mouth in the morning and at bedtime. Takes in AM    [provider]  carvedilol (COREG) 12.5 MG tablet Take 12.5  mg by mouth 2 (two) times daily with a meal.     [provider]  carvedilol (COREG) 25 MG tablet Take 25 mg by mouth 2 (two) times daily. 02/24/21   [provider]  clindamycin (CLINDAGEL) 1 % gel Apply topically. 08/24/20   [provider]  HUMULIN 70/30 (70-30) 100 UNIT/ML injection Inject 10-30 Units into the skin See admin instructions. 10 units before breakfast and 30 units before supper ON DIALYSIS DAYS (M-W-F), NO AM INSULIN 04/19/15   [provider]  hydrOXYzine (ATARAX/VISTARIL) 25 MG tablet Take 25 mg by mouth at bedtime. 08/23/20   [provider]   lidocaine-prilocaine (EMLA) cream Apply topically. 08/27/20 08/27/21  [provider]  loperamide (IMODIUM) 2 MG capsule Take 2 mg by mouth as needed for diarrhea or loose stools (Take 1/2-1 tablet as meeded to prevent diarrhea. Can take up to 1 tablet every 4 hours. Do not exceed 4 tablets in a day.).    [provider]  losartan (COZAAR) 50 MG tablet Take 50 mg by mouth daily. (1700) 05/22/18   [provider]  mupirocin ointment (BACTROBAN) 2 % Apply 1 application topically 2 (two) times daily. 08/23/20   Kris Hartmann, NP  NOVOLIN 70/30 KWIKPEN (70-30) 100 UNIT/ML KwikPen Inject 10-30 Units into the skin as directed. On dialysis days 02/24/21   [provider]  omeprazole (PRILOSEC) 20 MG capsule Take 20 mg by mouth daily. 03/01/20 03/01/21  [provider]  omeprazole (PRILOSEC) 40 MG capsule Take 40 mg by mouth daily. 04/09/21   [provider]  oxyCODONE-acetaminophen (PERCOCET/ROXICET) 5-325 MG tablet Take 1 tablet by mouth every 6 (six) hours as needed for severe pain. 08/23/20   Kris Hartmann, NP  pantoprazole (PROTONIX) 40 MG tablet Take 40 mg by mouth daily. 01/08/21   [provider]  SENSIPAR 60 MG tablet Take 60 mg by mouth daily at 6 PM. 1700 04/28/15   [provider]  sevelamer carbonate (RENVELA) 2.4 g PACK  02/27/20   [provider]  sildenafil (REVATIO) 20 MG tablet Take 1 tablet by mouth as directed. 02/21/21   [provider]  vitamin E 1000 UNIT capsule Take 1,000 Units by mouth daily. Not sure of dose; started 4/29    [provider]    Physical Exam: Vitals:   04/15/21 0748 04/15/21 0854 04/15/21 1100 04/15/21 1200  BP: 131/90 (!) 165/117 (!) 167/91 (!) 145/99  Pulse: 79 78 78 72  Resp: 18 18  20   Temp: 98.9 F (37.2 C) 98.9 F (37.2 C)    TempSrc: Oral Oral    SpO2: 99% 100% 98% 100%  Weight:      Height:        Physical Exam Vitals and nursing note reviewed.   Constitutional:      General: He is not in acute distress.    Appearance: Normal appearance. He is normal weight. He is not ill-appearing, toxic-appearing or diaphoretic.  HENT:     Head: Normocephalic and atraumatic.     Nose: Nose normal. No rhinorrhea.  Eyes:     General:        Right eye: No discharge.        Left eye: No discharge.  Cardiovascular:     Rate and Rhythm: Normal rate and regular rhythm.  Pulmonary:     Effort: Pulmonary effort is normal. No respiratory distress.     Breath sounds: Normal breath sounds. No wheezing or rales.  Abdominal:     General: Bowel sounds are normal. There is no distension.     Tenderness: There is no abdominal tenderness. There is no guarding.  Musculoskeletal:     Comments: No thrill or bruit felt in left upper extremity fistula.  Skin:    General: Skin is warm and dry.     Capillary Refill: Capillary refill takes less than 2 seconds.  Neurological:     General: No focal deficit present.     Mental Status: He is oriented to person, place, and time.     Labs on Admission: I have personally reviewed following labs and imaging studies  CBC: Recent Labs  Lab 04/15/21 0801  WBC 9.9  NEUTROABS 7.3  HGB 11.8*  HCT 37.9*  MCV 87.9  PLT 161   Basic Metabolic Panel: Recent Labs  Lab 04/15/21 0801  NA 135  K 5.6*  CL 99  CO2 23  GLUCOSE 169*  BUN 74*  CREATININE 16.37*  CALCIUM 7.3*   GFR: Estimated Creatinine Clearance: 7.9 mL/min (A) (by C-G formula based on SCr of 16.37 mg/dL (H)). Liver Function Tests: Recent Labs  Lab 04/15/21 0801  AST 14*  ALT 11  ALKPHOS 106  BILITOT 0.6  PROT 7.7  ALBUMIN 3.6   No results for input(s): LIPASE, AMYLASE in the last 168 hours. No results for input(s): AMMONIA in the last 168 hours. Coagulation Profile: No results for input(s): INR, PROTIME in the last 168 hours. Cardiac Enzymes: No results for input(s): CKTOTAL, CKMB, CKMBINDEX, TROPONINI in the last 168 hours. BNP (last  3 results) No results for input(s): PROBNP in the last 8760 hours. HbA1C: No results for input(s): HGBA1C in the last 72 hours. CBG: No results for input(s): GLUCAP in the last 168 hours. Lipid Profile: No results for input(s): CHOL, HDL, LDLCALC, TRIG, CHOLHDL, LDLDIRECT in the last 72 hours. Thyroid Function Tests: No results for input(s): TSH, T4TOTAL, FREET4, T3FREE, THYROIDAB in the last 72 hours. Anemia Panel: No results for input(s): VITAMINB12, FOLATE, FERRITIN, TIBC, IRON, RETICCTPCT in the last 72 hours. Urine analysis:    Component Value Date/Time   COLORURINE Yellow 02/22/2014 1640   APPEARANCEUR Clear 02/22/2014 1640   LABSPEC 1.012 02/22/2014 1640   PHURINE 8.0 02/22/2014 1640   GLUCOSEU >=500 02/22/2014 1640   HGBUR Negative 02/22/2014 1640   BILIRUBINUR Negative 02/22/2014 1640   KETONESUR Negative 02/22/2014 1640   PROTEINUR >=500 02/22/2014 1640   NITRITE Negative 02/22/2014 1640   LEUKOCYTESUR Negative 02/22/2014 1640    Radiological Exams on Admission: I have personally reviewed images No results found.  EKG: I have personally reviewed EKG: NSR   Assessment/Plan Principal Problem:   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HCC) Active Problems:   ESRD on hemodialysis ([place]) - M, W, F via LUE AV fistula   Diabetes mellitus with ESRD (end-stage renal disease) ([place])   Hypertension   Uncontrolled type 2 diabetes mellitus with hyperglycemia, with long-term current use of insulin (HCC)   Coronary artery disease   Obesity, Class III, BMI 40-49.9 (morbid obesity) ([place])    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[place]) Assigned observation medical bed.  Keep the patient n.p.o. until seen by surgery.  Keep him n.p.o. if he is able to go to surgery today for AV fistula declot.  If declotting procedure is not scheduled until tomorrow, then he may eat today then n.p.o. after midnight.  Discussed with the patient and his mother normal and that patient may need temporary hemodialysis catheter if becomes  volume  overloaded, has shortness of breath or needs emergent dialysis.  Diabetes mellitus with ESRD (end-stage renal disease) ([place]) Continue with insulin.  Hypertension Continue home blood pressure medications.  Including Coreg, losartan.  Uncontrolled type 2 diabetes mellitus with hyperglycemia, with long-term current use of insulin (HCC) Continue insulin.  Coronary artery disease Continue aspirin.  Continue beta-blocker.  Obesity, Class III, BMI 40-49.9 (morbid obesity) (HCC) Chronic.  ESRD on hemodialysis ([place]) - M, W, F via LUE AV fistula Nephrologist notified of patient's admission by EDP.  DVT prophylaxis: SQ Heparin Code Status: Full Code Family Communication: discussed with pt and his mother norma at bedside  Disposition Plan: return home  Consults called: EDP has consulted nephrology([place]) and vascular surgery([place]) Admission status: Observation, Med-Surg   Kristopher Oppenheim, DO [facility] 04/15/2021, 1:45 PM

## 2021-04-15 NOTE — Assessment & Plan Note (Signed)
Continue with insulin.

## 2021-04-15 NOTE — Assessment & Plan Note (Signed)
Chronic. 

## 2021-04-16 ENCOUNTER — Ambulatory Visit
Admission: EM | Disposition: A | Discharge status: Home / Self Care | Payer: Self-pay | Source: Home / Self Care | Attending: Emergency Medicine

## 2021-04-16 ENCOUNTER — Encounter: Payer: Self-pay | Admitting: Internal Medicine

## 2021-04-16 DIAGNOSIS — N185 Chronic kidney disease, stage 5: Secondary | ICD-10-CM | POA: Diagnosis not present

## 2021-04-16 DIAGNOSIS — T82898A Other specified complication of vascular prosthetic devices, implants and grafts, initial encounter: Secondary | ICD-10-CM | POA: Diagnosis not present

## 2021-04-16 DIAGNOSIS — T82868A Thrombosis of vascular prosthetic devices, implants and grafts, initial encounter: Secondary | ICD-10-CM | POA: Diagnosis not present

## 2021-04-16 HISTORY — PX: A/V SHUNTOGRAM: CATH118297

## 2021-04-16 LAB — COMPREHENSIVE METABOLIC PANEL
ALT: 9 U/L (ref 0–44)
AST: 11 U/L — ABNORMAL LOW (ref 15–41)
Albumin: 3.4 g/dL — ABNORMAL LOW (ref 3.5–5.0)
Alkaline Phosphatase: 103 U/L (ref 38–126)
Anion gap: 16 — ABNORMAL HIGH (ref 5–15)
BUN: 88 mg/dL — ABNORMAL HIGH (ref 6–20)
CO2: 20 mmol/L — ABNORMAL LOW (ref 22–32)
Calcium: 7.8 mg/dL — ABNORMAL LOW (ref 8.9–10.3)
Chloride: 98 mmol/L (ref 98–111)
Creatinine, Ser: 18.26 mg/dL — ABNORMAL HIGH (ref 0.61–1.24)
GFR, Estimated: 3 mL/min — ABNORMAL LOW (ref >60)
Glucose, Bld: 312 mg/dL — ABNORMAL HIGH (ref 70–99)
Potassium: 4.8 mmol/L (ref 3.5–5.1)
Sodium: 134 mmol/L — ABNORMAL LOW (ref 135–145)
Total Bilirubin: 0.7 mg/dL (ref 0.3–1.2)
Total Protein: 7.3 g/dL (ref 6.5–8.1)

## 2021-04-16 LAB — CBC WITH DIFFERENTIAL/PLATELET
Abs Immature Granulocytes: 0.02 10*3/uL (ref 0.00–0.07)
Basophils Absolute: 0.1 10*3/uL (ref 0.0–0.1)
Basophils Relative: 1 %
Eosinophils Absolute: 0.4 10*3/uL (ref 0.0–0.5)
Eosinophils Relative: 6 %
HCT: 37.4 % — ABNORMAL LOW (ref 39.0–52.0)
Hemoglobin: 11.8 g/dL — ABNORMAL LOW (ref 13.0–17.0)
Immature Granulocytes: 0 %
Lymphocytes Relative: 16 %
Lymphs Abs: 1.1 10*3/uL (ref 0.7–4.0)
MCH: 28.2 pg (ref 26.0–34.0)
MCHC: 31.6 g/dL (ref 30.0–36.0)
MCV: 89.3 fL (ref 80.0–100.0)
Monocytes Absolute: 0.6 10*3/uL (ref 0.1–1.0)
Monocytes Relative: 8 %
Neutro Abs: 4.8 10*3/uL (ref 1.7–7.7)
Neutrophils Relative %: 69 %
Platelets: 164 10*3/uL (ref 150–400)
RBC: 4.19 MIL/uL — ABNORMAL LOW (ref 4.22–5.81)
RDW: 16.2 % — ABNORMAL HIGH (ref 11.5–15.5)
WBC: 7 10*3/uL (ref 4.0–10.5)
nRBC: 0 % (ref 0.0–0.2)

## 2021-04-16 LAB — CBG MONITORING, ED: Glucose-Capillary: 250 mg/dL — ABNORMAL HIGH (ref 70–99)

## 2021-04-16 LAB — HEMOGLOBIN A1C
Hgb A1c MFr Bld: 9.7 % — ABNORMAL HIGH (ref 4.8–5.6)
Mean Plasma Glucose: 232 mg/dL

## 2021-04-16 LAB — GLUCOSE, CAPILLARY
Glucose-Capillary: 214 mg/dL — ABNORMAL HIGH (ref 70–99)
Glucose-Capillary: 323 mg/dL — ABNORMAL HIGH (ref 70–99)
Glucose-Capillary: 524 mg/dL (ref 70–99)

## 2021-04-16 LAB — HEPATITIS B SURFACE ANTIBODY,QUALITATIVE: Hep B S Ab: REACTIVE — AB

## 2021-04-16 LAB — HEPATITIS B SURFACE ANTIGEN: Hepatitis B Surface Ag: NONREACTIVE

## 2021-04-16 SURGERY — A/V SHUNTOGRAM
Anesthesia: Moderate Sedation | Laterality: Left

## 2021-04-16 MED ORDER — MIDAZOLAM HCL 2 MG/2ML IJ SOLN
INTRAMUSCULAR | Status: DC | PRN
  Administered 2021-04-16 (×2): 1 mg via INTRAVENOUS
  Administered 2021-04-16: 2 mg via INTRAVENOUS
  Administered 2021-04-16: 1 mg via INTRAVENOUS

## 2021-04-16 MED ORDER — FENTANYL CITRATE PF 50 MCG/ML IJ SOSY
PREFILLED_SYRINGE | INTRAMUSCULAR | Status: AC
  Filled 2021-04-16: qty 2

## 2021-04-16 MED ORDER — MIDAZOLAM HCL 2 MG/ML PO SYRP: 8.0000 mg | ORAL_SOLUTION | Freq: Once | ORAL | Status: DC | PRN

## 2021-04-16 MED ORDER — FAMOTIDINE 20 MG PO TABS
ORAL_TABLET | ORAL | Status: AC
  Administered 2021-04-16: 12:00:00 40 mg via ORAL
  Filled 2021-04-16: qty 2

## 2021-04-16 MED ORDER — INSULIN ASPART 100 UNIT/ML IJ SOLN
8.0000 [IU] | Freq: Once | INTRAMUSCULAR | Status: AC
Start: 2021-04-16 — End: 2021-04-16
  Administered 2021-04-16: 23:00:00 8 [IU] via SUBCUTANEOUS
  Filled 2021-04-16: qty 1

## 2021-04-16 MED ORDER — HYDRALAZINE HCL 20 MG/ML IJ SOLN
10.0000 mg | INTRAMUSCULAR | Status: DC | PRN
  Administered 2021-04-16 – 2021-04-17 (×2): 10 mg via INTRAVENOUS
  Filled 2021-04-16 (×2): qty 1

## 2021-04-16 MED ORDER — HYDROMORPHONE HCL 1 MG/ML IJ SOLN
1.0000 mg | Freq: Once | INTRAMUSCULAR | Status: AC | PRN
  Administered 2021-04-16: 19:00:00 1 mg via INTRAVENOUS
  Filled 2021-04-16: qty 1

## 2021-04-16 MED ORDER — METHYLPREDNISOLONE SODIUM SUCC 125 MG IJ SOLR
INTRAMUSCULAR | Status: AC
  Administered 2021-04-16: 12:00:00 125 mg via INTRAVENOUS
  Filled 2021-04-16: qty 2

## 2021-04-16 MED ORDER — MIDAZOLAM HCL 5 MG/5ML IJ SOLN
INTRAMUSCULAR | Status: AC
  Filled 2021-04-16: qty 5

## 2021-04-16 MED ORDER — DIPHENHYDRAMINE HCL 50 MG/ML IJ SOLN: 50.0000 mg | Freq: Once | INTRAMUSCULAR | Status: AC | PRN

## 2021-04-16 MED ORDER — HEPARIN SODIUM (PORCINE) 1000 UNIT/ML IJ SOLN
INTRAMUSCULAR | Status: AC
  Filled 2021-04-16: qty 10

## 2021-04-16 MED ORDER — SODIUM CHLORIDE 0.9 % IV SOLN: INTRAVENOUS | Status: DC

## 2021-04-16 MED ORDER — ONDANSETRON HCL 4 MG/2ML IJ SOLN: 4.0000 mg | Freq: Four times a day (QID) | INTRAMUSCULAR | Status: DC | PRN

## 2021-04-16 MED ORDER — DIPHENHYDRAMINE HCL 50 MG/ML IJ SOLN
INTRAMUSCULAR | Status: AC
  Administered 2021-04-16: 12:00:00 50 mg via INTRAVENOUS
  Filled 2021-04-16: qty 1

## 2021-04-16 MED ORDER — CLINDAMYCIN PHOSPHATE 300 MG/50ML IV SOLN
INTRAVENOUS | Status: AC
  Administered 2021-04-16: 14:00:00 300 mg via INTRAVENOUS
  Filled 2021-04-16: qty 50

## 2021-04-16 MED ORDER — CLINDAMYCIN PHOSPHATE 300 MG/50ML IV SOLN: 300.0000 mg | Freq: Once | INTRAVENOUS | Status: AC

## 2021-04-16 MED ORDER — FENTANYL CITRATE PF 50 MCG/ML IJ SOSY
PREFILLED_SYRINGE | INTRAMUSCULAR | Status: AC
  Filled 2021-04-16: qty 1

## 2021-04-16 MED ORDER — FAMOTIDINE 20 MG PO TABS: 40.0000 mg | ORAL_TABLET | Freq: Once | ORAL | Status: AC | PRN

## 2021-04-16 MED ORDER — BLISTEX MEDICATED EX OINT
TOPICAL_OINTMENT | Freq: Once | CUTANEOUS | Status: AC
  Filled 2021-04-16: qty 6.3

## 2021-04-16 MED ORDER — FENTANYL CITRATE (PF) 100 MCG/2ML IJ SOLN
INTRAMUSCULAR | Status: DC | PRN
  Administered 2021-04-16: 50 ug via INTRAVENOUS
  Administered 2021-04-16: 25 ug via INTRAVENOUS
  Administered 2021-04-16: 50 ug via INTRAVENOUS
  Administered 2021-04-16: 25 ug via INTRAVENOUS

## 2021-04-16 MED ORDER — METHYLPREDNISOLONE SODIUM SUCC 125 MG IJ SOLR: 125.0000 mg | Freq: Once | INTRAMUSCULAR | Status: AC | PRN

## 2021-04-16 SURGICAL SUPPLY — 16 items
CANNULA 5F STIFF (CANNULA) ×4 IMPLANT
CATH BEACON 5 .035 65 KMP TIP (CATHETERS) ×2 IMPLANT
CATH CANNON HEMO 15FR 19 (HEMODIALYSIS SUPPLIES) ×2 IMPLANT
COVER PROBE U/S 5X48 (MISCELLANEOUS) ×4 IMPLANT
DERMABOND ADVANCED (GAUZE/BANDAGES/DRESSINGS) ×2
DERMABOND ADVANCED .7 DNX12 (GAUZE/BANDAGES/DRESSINGS) IMPLANT
DEVICE TORQUE (MISCELLANEOUS) ×2 IMPLANT
DRAPE BRACHIAL (DRAPES) ×4 IMPLANT
DRAPE INCISE IOBAN 66X45 STRL (DRAPES) ×2 IMPLANT
GLIDEWIRE STIFF .35X180X3 HYDR (WIRE) ×2 IMPLANT
GUIDEWIRE SUPER STIFF .035X180 (WIRE) ×2 IMPLANT
PACK ANGIOGRAPHY (CUSTOM PROCEDURE TRAY) ×3 IMPLANT
SHEATH BRITE TIP 6FRX5.5 (SHEATH) ×2 IMPLANT
SUT MNCRL AB 4-0 PS2 18 (SUTURE) ×4 IMPLANT
SUT SILK 0 FSL (SUTURE) ×2 IMPLANT
WIRE NITINOL .018 (WIRE) ×2 IMPLANT

## 2021-04-16 NOTE — Progress Notes (Signed)
[facility]  ROUNDING NOTE   Subjective:   [name] [name] is a 45 y.o. male with past medical history of CHF, diabetes, GERD, hypertension, anemia and end stage renal disease on dialysis.  Patient presents to the emergency room with complaints of a clotted HD access. Patient admitted for AV fistula occlusion [facility]) [T82.898A]  Update Patient seen resting in bed Appears to be in no distress Currently n.p.o. for vascular procedure   Objective:  Vital signs in last 24 hours:  Temp:  [97.2 F (36.2 C)-98.1 F (36.7 C)] 98.1 F (36.7 C) (12/20 0818) Pulse Rate:  [72-80] 73 (12/20 0818) Resp:  [16-20] 16 (12/20 0818) BP: (126-167)/(78-99) 140/87 (12/20 0818) SpO2:  [96 %-100 %] 98 % (12/20 0818)  Weight change:  Filed Weights   04/15/21 0736  Weight: 130.1 kg    Intake/Output: No intake/output data recorded.   Intake/Output this shift:  No intake/output data recorded.  Physical Exam: General: NAD, resting in bed  Head: Normocephalic, atraumatic. Moist oral mucosal membranes  Eyes: Anicteric  Lungs:  Clear to auscultation, normal effort  Heart: Regular rate and rhythm  Abdomen:  Soft, nontender,   Extremities:  no peripheral edema.  Neurologic: Nonfocal, moving all four extremities  Skin: No lesions  Access: LUE AVF (no bruit/thrill)    Basic Metabolic Panel: Recent Labs  Lab 04/15/21 0801 04/16/21 0450  NA 135 134*  K 5.6* 4.8  CL 99 98  CO2 23 20*  GLUCOSE 169* 312*  BUN 74* 88*  CREATININE 16.37* 18.26*  CALCIUM 7.3* 7.8*     Liver Function Tests: Recent Labs  Lab 04/15/21 0801 04/16/21 0450  AST 14* 11*  ALT 11 9  ALKPHOS 106 103  BILITOT 0.6 0.7  PROT 7.7 7.3  ALBUMIN 3.6 3.4*    No results for input(s): LIPASE, AMYLASE in the last 168 hours. No results for input(s): AMMONIA in the last 168 hours.  CBC: Recent Labs  Lab 04/15/21 0801 04/16/21 0450  WBC 9.9 7.0  NEUTROABS 7.3 4.8  HGB 11.8* 11.8*  HCT 37.9* 37.4*   MCV 87.9 89.3  PLT 170 164     Cardiac Enzymes: No results for input(s): CKTOTAL, CKMB, CKMBINDEX, TROPONINI in the last 168 hours.  BNP: Invalid input(s): POCBNP  CBG: Recent Labs  Lab 04/15/21 2021 04/15/21 2141 04/16/21 0816  GLUCAP 239* 272* 250*    Microbiology: Results for orders placed or performed during the hospital encounter of 04/15/21  Resp Panel by RT-PCR (Flu A&B, Covid) Nasopharyngeal Swab     Status: None   Collection Time: 04/15/21 10:08 PM   Specimen: Nasopharyngeal Swab; Nasopharyngeal(NP) swabs in vial transport medium  Result Value Ref Range Status   SARS Coronavirus 2 by RT PCR NEGATIVE NEGATIVE Final    Comment: (NOTE) SARS-CoV-2 target nucleic acids are NOT DETECTED.  The SARS-CoV-2 RNA is generally detectable in upper respiratory specimens during the acute phase of infection. The lowest concentration of SARS-CoV-2 viral copies this assay can detect is 138 copies/mL. A negative result does not preclude SARS-Cov-2 infection and should not be used as the sole basis for treatment or other patient management decisions. A negative result may occur with  improper specimen collection/handling, submission of specimen other than nasopharyngeal swab, presence of viral mutation(s) within the areas targeted by this assay, and inadequate number of viral copies(<138 copies/mL). A negative result must be combined with clinical observations, patient history, and epidemiological information. The expected result is Negative.  Fact Sheet for  Patients:  EntrepreneurPulse.com.au  Fact Sheet for Healthcare Providers:  IncredibleEmployment.be  This test is no t yet approved or cleared by the [place] FDA and  has been authorized for detection and/or diagnosis of SARS-CoV-2 by FDA under an Emergency Use Authorization (EUA). This EUA will remain  in effect (meaning this test can be used) for the duration of the COVID-19  declaration under Section 564(b)(1) of the Act, 21 U.S.C.section 360bbb-3(b)(1), unless the authorization is terminated  or revoked sooner.       Influenza A by PCR NEGATIVE NEGATIVE Final   Influenza B by PCR NEGATIVE NEGATIVE Final    Comment: (NOTE) The Xpert Xpress SARS-CoV-2/FLU/RSV plus assay is intended as an aid in the diagnosis of influenza from Nasopharyngeal swab specimens and should not be used as a sole basis for treatment. Nasal washings and aspirates are unacceptable for Xpert Xpress SARS-CoV-2/FLU/RSV testing.  Fact Sheet for Patients: EntrepreneurPulse.com.au  Fact Sheet for Healthcare Providers: IncredibleEmployment.be  This test is not yet approved or cleared by the [place] FDA and has been authorized for detection and/or diagnosis of SARS-CoV-2 by FDA under an Emergency Use Authorization (EUA). This EUA will remain in effect (meaning this test can be used) for the duration of the COVID-19 declaration under Section 564(b)(1) of the Act, 21 U.S.C. section 360bbb-3(b)(1), unless the authorization is terminated or revoked.  Performed at [facility], [address] [place], [place] [address]     Coagulation Studies: No results for input(s): LABPROT, INR in the last 72 hours.  Urinalysis: No results for input(s): COLORURINE, LABSPEC, PHURINE, GLUCOSEU, HGBUR, BILIRUBINUR, KETONESUR, PROTEINUR, UROBILINOGEN, NITRITE, LEUKOCYTESUR in the last 72 hours.  Invalid input(s): APPERANCEUR    Imaging: No results found.   Medications:    sodium chloride     sodium chloride      aspirin EC  81 mg Oral q1800   calcium acetate  667 mg Oral BID WC   carvedilol  25 mg Oral BID WC   Chlorhexidine Gluconate Cloth  6 each Topical Q0600   cinacalcet  60 mg Oral Q supper   heparin  5,000 Units Subcutaneous Q8H   hydrOXYzine  25 mg Oral QHS   insulin aspart  0-5 Units Subcutaneous QHS   insulin aspart  0-6 Units  Subcutaneous TID WC   losartan  50 mg Oral Daily   pantoprazole  40 mg Oral Daily   patiromer  16.8 g Oral Daily     Assessment/ Plan:  [name] is a 45 y.o.  male with past medical history of CHF, diabetes, GERD, hypertension, anemia and end stage renal disease on dialysis.  Patient presents to the emergency room with complaints of a clotted HD access. Patient admitted for 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[place]) [T82.898A]  [facility] [place]/MWF/ LUE AVF  Hyperkalemia with end stage renal disease  Potassium corrected to 4.8.  Plan for dialysis after vascular intervention.  UF goal 3 to 3.5 L.  If stable, cleared to discharge after dialysis.  Anemia of chronic kidney disease  Lab Results  Component Value Date   HGB 11.8 (L) 04/16/2021  We will continue to monitor.  No need for ESA's at this time  3. Secondary Hyperparathyroidism: Lab Results  Component Value Date   CALCIUM 7.8 (L) 04/16/2021   PHOS 7.9 (H) 09/24/2020    Corrected calcium of 8.3.  Phosphorus remains elevated.  Continue calcium acetate with meals  4. AVF occlusion  Fistulogram with intervention planned for today  LOS: 0 [place] 12/20/202210:54 AM

## 2021-04-16 NOTE — Progress Notes (Signed)
PROGRESS NOTE    [name] [name]  SEG:[id number] DOB: April 13, 1976 DOA: 04/15/2021 PCP: Danelle Berry, NP   Brief Narrative:  45 yo AAM with hx of type 2 DM on insulin, end-stage renal disease on hemodialysis on Monday Wednesday Friday via left upper extremity AV fistula, hypertension presents to the ER today from the dialysis unit.  Dialysis unit unable to access his left upper extremity AV fistula despite multiple attempts.  Patient sent to the ER for evaluation.   Assessment & Plan:   AV fistula occlusion [facility]) Vascular/Nephrology following - current recommendations to undergoing a left upper extremity shuntogram with possible intervention and attempt to restore function to his access Will need dialysis post repair  Resume PO status post procedure   Diabetes mellitus with ESRD (end-stage renal disease) ([place]) Continue with insulin.   Hypertension Continue home blood pressure medications.  Including Coreg, losartan.   Uncontrolled type 2 diabetes mellitus with hyperglycemia, with long-term current use of insulin (HCC) Continue insulin/diabetic diet pos procedure.   Coronary artery disease Continue aspirin.  Continue beta-blocker.   Obesity, Class III, BMI 40-49.9 (morbid obesity) (HCC) Chronic.   ESRD on hemodialysis ([place]) - M, W, F via LUE AV fistula Nephrologist notified of patient's admission by EDP.   DVT prophylaxis: SQ Heparin Code Status: Full Code Family Communication: At bedisde   Status is: Obs  Dispo: The patient is from: Home              Anticipated d/c is to: home              Anticipated d/c date is: TBD              Patient currently NOT medically stable for discharge  Consultants:  Nephro/Vascular   Subjective: No acute issues/events overnight - patient only complaint is hunger  Objective: Vitals:   04/15/21 1200 04/15/21 1706 04/16/21 0107 04/16/21 0454  BP: (!) 145/99 (!) 147/84 (!) 148/84 126/78  Pulse: 72 78 80 77  Resp: 20 20 17 18    Temp:  (!) 97.2 F (36.2 C) 98 F (36.7 C)   TempSrc:  Oral Oral   SpO2: 100% 96% 99% 100%  Weight:      Height:       No intake or output data in the 24 hours ending 04/16/21 0800 Filed Weights   04/15/21 0736  Weight: 130.1 kg    Examination:  General exam: Appears calm and comfortable  Respiratory system: Clear to auscultation. Respiratory effort normal. Cardiovascular system: S1 & S2 heard, RRR. No JVD, murmurs, rubs, gallops or clicks. No pedal edema. LUE fistula with poorly palpable bruit Gastrointestinal system: Abdomen is nondistended, soft and nontender. No organomegaly or masses felt. Normal bowel sounds heard. Central nervous system: Alert and oriented. No focal neurological deficits. Extremities: Symmetric 5 x 5 power. Skin: No rashes, lesions or ulcers Psychiatry: Judgement and insight appear normal. Mood & affect appropriate.     Data Reviewed: I have personally reviewed following labs and imaging studies  CBC: Recent Labs  Lab 04/15/21 0801 04/16/21 0450  WBC 9.9 7.0  NEUTROABS 7.3 4.8  HGB 11.8* 11.8*  HCT 37.9* 37.4*  MCV 87.9 89.3  PLT 170 737   Basic Metabolic Panel: Recent Labs  Lab 04/15/21 0801 04/16/21 0450  NA 135 134*  K 5.6* 4.8  CL 99 98  CO2 23 20*  GLUCOSE 169* 312*  BUN 74* 88*  CREATININE 16.37* 18.26*  CALCIUM 7.3* 7.8*   GFR:  Estimated Creatinine Clearance: 7.1 mL/min (A) (by C-G formula based on SCr of 18.26 mg/dL (H)). Liver Function Tests: Recent Labs  Lab 04/15/21 0801 04/16/21 0450  AST 14* 11*  ALT 11 9  ALKPHOS 106 103  BILITOT 0.6 0.7  PROT 7.7 7.3  ALBUMIN 3.6 3.4*   No results for input(s): LIPASE, AMYLASE in the last 168 hours. No results for input(s): AMMONIA in the last 168 hours. Coagulation Profile: No results for input(s): INR, PROTIME in the last 168 hours. Cardiac Enzymes: No results for input(s): CKTOTAL, CKMB, CKMBINDEX, TROPONINI in the last 168 hours. BNP (last 3 results) No results  for input(s): PROBNP in the last 8760 hours. HbA1C: No results for input(s): HGBA1C in the last 72 hours. CBG: Recent Labs  Lab 04/15/21 2021 04/15/21 2141  GLUCAP 239* 272*   Lipid Profile: No results for input(s): CHOL, HDL, LDLCALC, TRIG, CHOLHDL, LDLDIRECT in the last 72 hours. Thyroid Function Tests: No results for input(s): TSH, T4TOTAL, FREET4, T3FREE, THYROIDAB in the last 72 hours. Anemia Panel: No results for input(s): VITAMINB12, FOLATE, FERRITIN, TIBC, IRON, RETICCTPCT in the last 72 hours. Sepsis Labs: No results for input(s): PROCALCITON, LATICACIDVEN in the last 168 hours.  Recent Results (from the past 240 hour(s))  Resp Panel by RT-PCR (Flu A&B, Covid) Nasopharyngeal Swab     Status: None   Collection Time: 04/15/21 10:08 PM   Specimen: Nasopharyngeal Swab; Nasopharyngeal(NP) swabs in vial transport medium  Result Value Ref Range Status   SARS Coronavirus 2 by RT PCR NEGATIVE NEGATIVE Final    Comment: (NOTE) SARS-CoV-2 target nucleic acids are NOT DETECTED.  The SARS-CoV-2 RNA is generally detectable in upper respiratory specimens during the acute phase of infection. The lowest concentration of SARS-CoV-2 viral copies this assay can detect is 138 copies/mL. A negative result does not preclude SARS-Cov-2 infection and should not be used as the sole basis for treatment or other patient management decisions. A negative result may occur with  improper specimen collection/handling, submission of specimen other than nasopharyngeal swab, presence of viral mutation(s) within the areas targeted by this assay, and inadequate number of viral copies(<138 copies/mL). A negative result must be combined with clinical observations, patient history, and epidemiological information. The expected result is Negative.  Fact Sheet for Patients:  EntrepreneurPulse.com.au  Fact Sheet for Healthcare Providers:  IncredibleEmployment.be  This  test is no t yet approved or cleared by the [place] FDA and  has been authorized for detection and/or diagnosis of SARS-CoV-2 by FDA under an Emergency Use Authorization (EUA). This EUA will remain  in effect (meaning this test can be used) for the duration of the COVID-19 declaration under Section 564(b)(1) of the Act, 21 U.S.C.section 360bbb-3(b)(1), unless the authorization is terminated  or revoked sooner.       Influenza A by PCR NEGATIVE NEGATIVE Final   Influenza B by PCR NEGATIVE NEGATIVE Final    Comment: (NOTE) The Xpert Xpress SARS-CoV-2/FLU/RSV plus assay is intended as an aid in the diagnosis of influenza from Nasopharyngeal swab specimens and should not be used as a sole basis for treatment. Nasal washings and aspirates are unacceptable for Xpert Xpress SARS-CoV-2/FLU/RSV testing.  Fact Sheet for Patients: EntrepreneurPulse.com.au  Fact Sheet for Healthcare Providers: IncredibleEmployment.be  This test is not yet approved or cleared by the [place] FDA and has been authorized for detection and/or diagnosis of SARS-CoV-2 by FDA under an Emergency Use Authorization (EUA). This EUA will remain in effect (meaning this test can be  used) for the duration of the COVID-19 declaration under Section 564(b)(1) of the Act, 21 U.S.C. section 360bbb-3(b)(1), unless the authorization is terminated or revoked.  Performed at [facility], [address], [place], [place] [address]     Radiology Studies: No results found.  Scheduled Meds:  aspirin EC  81 mg Oral q1800   calcium acetate  667 mg Oral BID WC   carvedilol  25 mg Oral BID WC   Chlorhexidine Gluconate Cloth  6 each Topical Q0600   cinacalcet  60 mg Oral Q supper   heparin  5,000 Units Subcutaneous Q8H   hydrOXYzine  25 mg Oral QHS   insulin aspart  0-5 Units Subcutaneous QHS   insulin aspart  0-6 Units Subcutaneous TID WC   losartan  50 mg Oral Daily    pantoprazole  40 mg Oral Daily   patiromer  16.8 g Oral Daily   Continuous Infusions:  sodium chloride     sodium chloride       LOS: 0 days   Time spent: 39min  Clevie Prout C Daniah Zaldivar, DO [facility]  If 7PM-7AM, please contact night-coverage [email or web address]  04/16/2021, 8:00 AM

## 2021-04-16 NOTE — Plan of Care (Signed)

## 2021-04-16 NOTE — Progress Notes (Signed)
Inpatient Diabetes Program Recommendations  AACE/ADA: New Consensus Statement on Inpatient Glycemic Control   Target Ranges:  Prepandial:   less than 140 mg/dL      Peak postprandial:   less than 180 mg/dL (1-2 hours)      Critically ill patients:  140 - 180 mg/dL    Latest Reference Range & Units 04/15/21 20:21 04/15/21 21:41 04/16/21 08:16  Glucose-Capillary 70 - 99 mg/dL 239 (H) 272 (H) 250 (H)   Review of Glycemic Control  Diabetes history: DM2 Outpatient Diabetes medications: Humulin 10 units QAM, Humulin 30 units QPM Current orders for Inpatient glycemic control: Novolog 0-6 units TID with meals, Novolog 0-5 units QHS  Inpatient Diabetes Program Recommendations:    Insulin: If glucose continues to be consistently over 180 mg/dl, please consider ordering Levemir 5 units Q24H.  Thanks, Barnie Alderman, RN, MSN, CDE Diabetes Coordinator Inpatient Diabetes Program [phone] (Team Pager from 8am to 5pm)

## 2021-04-16 NOTE — Progress Notes (Signed)
Patient ambulating outside room in hallway with no assistance. Tolerating well, no acute distress.

## 2021-04-17 DIAGNOSIS — T82868A Thrombosis of vascular prosthetic devices, implants and grafts, initial encounter: Secondary | ICD-10-CM | POA: Diagnosis not present

## 2021-04-17 DIAGNOSIS — E1122 Type 2 diabetes mellitus with diabetic chronic kidney disease: Secondary | ICD-10-CM | POA: Diagnosis not present

## 2021-04-17 DIAGNOSIS — N186 End stage renal disease: Secondary | ICD-10-CM | POA: Diagnosis not present

## 2021-04-17 DIAGNOSIS — T82898A Other specified complication of vascular prosthetic devices, implants and grafts, initial encounter: Secondary | ICD-10-CM | POA: Diagnosis not present

## 2021-04-17 LAB — BASIC METABOLIC PANEL
Anion gap: 17 — ABNORMAL HIGH (ref 5–15)
BUN: 81 mg/dL — ABNORMAL HIGH (ref 6–20)
CO2: 18 mmol/L — ABNORMAL LOW (ref 22–32)
Calcium: 7.7 mg/dL — ABNORMAL LOW (ref 8.9–10.3)
Chloride: 93 mmol/L — ABNORMAL LOW (ref 98–111)
Creatinine, Ser: 16.09 mg/dL — ABNORMAL HIGH (ref 0.61–1.24)
GFR, Estimated: 3 mL/min — ABNORMAL LOW (ref >60)
Glucose, Bld: 579 mg/dL (ref 70–99)
Potassium: 5.7 mmol/L — ABNORMAL HIGH (ref 3.5–5.1)
Sodium: 128 mmol/L — ABNORMAL LOW (ref 135–145)

## 2021-04-17 LAB — HEPATITIS B SURFACE ANTIBODY, QUANTITATIVE: Hep B S AB Quant (Post): 15 m[IU]/mL (ref >9.9)

## 2021-04-17 LAB — CBC
HCT: 39.5 % (ref 39.0–52.0)
Hemoglobin: 12.6 g/dL — ABNORMAL LOW (ref 13.0–17.0)
MCH: 27.4 pg (ref 26.0–34.0)
MCHC: 31.9 g/dL (ref 30.0–36.0)
MCV: 85.9 fL (ref 80.0–100.0)
Platelets: 158 10*3/uL (ref 150–400)
RBC: 4.6 MIL/uL (ref 4.22–5.81)
RDW: 15.9 % — ABNORMAL HIGH (ref 11.5–15.5)
WBC: 7.8 10*3/uL (ref 4.0–10.5)
nRBC: 0 % (ref 0.0–0.2)

## 2021-04-17 LAB — GLUCOSE, CAPILLARY
Glucose-Capillary: 509 mg/dL (ref 70–99)
Glucose-Capillary: 379 mg/dL — ABNORMAL HIGH (ref 70–99)
Glucose-Capillary: 268 mg/dL — ABNORMAL HIGH (ref 70–99)
Glucose-Capillary: 351 mg/dL — ABNORMAL HIGH (ref 70–99)

## 2021-04-17 MED ORDER — INSULIN ASPART 100 UNIT/ML IJ SOLN
10.0000 [IU] | Freq: Once | INTRAMUSCULAR | Status: AC
  Administered 2021-04-17: 05:00:00 10 [IU] via SUBCUTANEOUS
  Filled 2021-04-17: qty 1

## 2021-04-17 MED ORDER — HEPARIN SODIUM (PORCINE) 1000 UNIT/ML IJ SOLN
INTRAMUSCULAR | Status: AC
  Filled 2021-04-17: qty 10

## 2021-04-17 MED ORDER — INSULIN DETEMIR 100 UNIT/ML ~~LOC~~ SOLN
10.0000 [IU] | Freq: Every day | SUBCUTANEOUS | Status: DC
  Administered 2021-04-17: 14:00:00 10 [IU] via SUBCUTANEOUS
  Filled 2021-04-17 (×2): qty 0.1

## 2021-04-17 NOTE — Plan of Care (Signed)

## 2021-04-17 NOTE — Progress Notes (Addendum)
Inpatient Diabetes Program Recommendations  AACE/ADA: New Consensus Statement on Inpatient Glycemic Control   Target Ranges:  Prepandial:   less than 140 mg/dL      Peak postprandial:   less than 180 mg/dL (1-2 hours)      Critically ill patients:  140 - 180 mg/dL    Latest Reference Range & Units 04/15/21 20:21 04/15/21 21:41 04/16/21 08:16 04/16/21 11:50 04/16/21 16:17 04/16/21 22:14 04/17/21 04:23  Glucose-Capillary 70 - 99 mg/dL 239 (H) 272 (H) 250 (H) 214 (H) 323 (H) 524 (HH) 509 (HH)    Latest Reference Range & Units 04/15/21 08:01  Hemoglobin A1C 4.8 - 5.6 % 9.7 (H)   Review of Glycemic Control  Diabetes history: DM2 Outpatient Diabetes medications: Humulin 70/30 10 units QAM, Humulin 70/30 30 units QPM Current orders for Inpatient glycemic control: Novolog 0-6 units TID with meals, Novolog 0-5 units QHS   Inpatient Diabetes Program Recommendations:     Insulin: Please consider ordering Levemir 10 units Q24H starting now.  NOTE: Per chart, patient received Solumedrol 125 mg x1 on 04/16/21 which is contributing to hyperglycemia. Patient has only received short acting insulin (Novolog) since arriving at the hospital.  Spoke with patient over the phone to inquire about DM medication and control. Patient states that he is taking Humulin 70/30 10 units QAM and Humulin 70/30 30 units QPM. Patient states that on dialysis days he does not take his morning dose of 70/30 because he has been instructed not to take it then due to risk of hypoglycemia. Inquired about last A1C and patient states it was 9%. Discussed current A1C of 9.7% on 04/15/21 indicating an average glucose of 232 mg/dl over the past 2-3 months. Discussed glucose and A1C goals and stressed importance of improving DM control. Discussed current insulin order with correction insulin only and informed patient that he received one time dose of Solumedrol 125 mg on 04/16/21 which is contributing to hyperglycemia. Informed patient that  it would be requested that basal insulin be ordered. Patient stated he needed to get off the phone to get ready for dialysis. Patient verbalized understanding of information discussed and asked that I also speak with his mother who is at bedside. Spoke with patient's mother regarding hyperglycemia and insulin recommendations being made today.   Thanks, Barnie Alderman, RN, MSN, CDE Diabetes Coordinator Inpatient Diabetes Program [phone] (Team Pager from 8am to 5pm)

## 2021-04-17 NOTE — Op Note (Signed)
OPERATIVE NOTE   PROCEDURE: Contrast injection left upper arm AV access. Insertion of tunneled dialysis catheter right IJ approach with ultrasound and fluoroscopic guidance.  PRE-OPERATIVE DIAGNOSIS: Complication of vascular access with thrombosis of the left upper arm AV access; end-stage renal disease requiring hemodialysis  POST-OPERATIVE DIAGNOSIS: Same  SURGEON: Hortencia Pilar.  ANESTHESIA: Conscious sedation was administered under my direct supervision by the interventional radiology RN. IV Versed plus fentanyl were utilized. Continuous ECG, pulse oximetry and blood pressure was monitored throughout the entire procedure. Conscious sedation was for a total of 85 minutes.  ESTIMATED BLOOD LOSS: Minimal cc  CONTRAST USED: 25 cc  FLUOROSCOPY TIME: 2.8 minutes   INDICATIONS:   [name] [name] a 45 y.o. y.o. male who presents with thrombosed left upper arm AV access.  He is therefore undergoing evaluation for possible thrombectomy possible intervention and possible placement of a tunneled catheter.  Risks and benefits have been reviewed all questions been answered patient has agreed to proceed.  DESCRIPTION: After obtaining full informed written consent, the patient was positioned supine.  Patient's left arm was prepped and draped in a sterile fashion.  Appropriate timeout is called.  Ultrasound was delivered onto a sleeve.  Ultrasound was is placed in a sterile sleeve and the AV access is imaged.  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  The previously placed stent is identified and has heterogeneous material in it which is consistent with thrombus.  A place above the aneurysm was selected for access and an image was recorded for the permanent record.  Under direct ultrasound visualization after 1% lidocaine has been infiltrated the soft tissue access is obtained to the AV graft in a retrograde direction.  6 French sheath is placed.  A floppy Glidewire and  Kumpe catheter were then negotiated into the brachial artery and hand-injection contrast was performed which demonstrates there is a trickle flow of blood into the aneurysmal area but the majority of the aneurysm is thrombosed.  This is quite a bit like a pseudoaneurysm.  The stent is noted to have retracted into the midportion of the aneurysm and is no longer opposing the wall of the vein at the level of the anastomosis.  The stent is thrombosed.  The sheath is then flipped into the antegrade direction over a wire and the Glidewire and Kumpe catheter negotiated up through the thrombosed access to the subclavian cephalic confluence.  Previously placed stent is noted there.  There is thrombus noted in the distal subclavian and proximal axillary area.  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  Stenosis persists more proximally almost to the confluence of the subclavian and internal jugular veins.  With the catheter in the in at the level of the innominate vein contrast is refluxed into the jugular vein on the left as well as through the and Ottoman superior vena cava which are both widely patent.    Given this finding this AV access is not salvageable in particular the arterial anastomosis needs to be completely reconstructed and there is no way to extend the stent and have any meaningful seal and then for any attempts at revascularizing this access will result in immediate thrombosis.  The subclavian stenosis is also an issue it certainly negates placing a brachial axillary graft in the left arm.  However, given the apparent patency of the left IJ a left-sided hero graft would  still be feasible.  Based on this information I elected to move forward with placement of a tunneled catheter on the right side to preserve the left jugular for possible hero graft creation.  The right neck and chest wall was prepped and draped in a  sterile fashion. Ultrasound was placed in a sterile sleeve. Ultrasound was utilized to identify the right vein which is noted to be echolucent and compressible indicating patency. Image is recorded for the permanent record. Under direct ultrasound visualization a micro-needle is inserted into the vein followed by the micro-wire. Micro-sheath was then advanced and a J wire is inserted without difficulty under fluoroscopic guidance. Small counterincision was made at the wire insertion site. Dilators are passed over the wire and the tunneled dialysis catheter is fed into the central venous system without difficulty.  Under fluoroscopy the catheter tip positioned at the atrial caval junction. The catheter is then approximated to the right chest wall and an exit site selected. 1% lidocaine is infiltrated in soft tissues at this level small incision is made and the tunneling device is then passed from the exit site to the right neck counterincision. Catheter is then connected to the tunneling device and the catheter was pulled subcutaneously. It is then transected and the hub assembly connected without difficulty. Both lumens aspirate and flush easily. After verification of smooth contour with proper tip position under fluoroscopy the catheter is packed with 5000 units of heparin per lumen.  Catheter secured to the skin of the right chest wall with 0 silk. A sterile dressing is applied with a Biopatch.  COMPLICATIONS: None  CONDITION: Good  Hortencia Pilar [place] [facility]. Office:  [phone]   04/17/2021,3:56 PM

## 2021-04-17 NOTE — Care Management Obs Status (Signed)
[facility] NOTIFICATION   Patient Details  Name: [name] [name] MRN: [id number] Date of Birth: Sep 18, 1975   Medicare Observation Status Notification Given:  Yes    Beverly Sessions, RN 04/17/2021, 1:50 PM

## 2021-04-17 NOTE — Progress Notes (Signed)
Received a call from dialysis nurse and was told that there is no consent form in pt chart for hemodialysis. Pt having hemodialysis tx at this time. Dialysis nurse said she was told by day nurse that there is a consent and was signed but upon rechecking it, there was none. Dialysis nurse told me that the pt agreeable to sign the consent form but when I went there pt refused and upset about it. Pt states, "that should have been done before starting me on dialysis". The Dialysis nurse spoke to the Dr about pt refusing to sign and had to stop the tx.

## 2021-04-17 NOTE — Progress Notes (Signed)
[facility]  ROUNDING NOTE   Subjective:   [name] [name] is a 45 y.o. male with past medical history of CHF, diabetes, GERD, hypertension, anemia and end stage renal disease on dialysis.  Patient presents to the emergency room with complaints of a clotted HD access. Patient admitted for Hyperkalemia [E87.5] 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[place]) [T82.898A] Arteriovenous fistula occlusion, initial encounter ([place]) [O13.086V]  Update Patient seen and evaluated during dialysis   HEMODIALYSIS FLOWSHEET:  Blood Flow Rate (mL/min): 400 mL/min Arterial Pressure (mmHg): -180 mmHg Venous Pressure (mmHg): 160 mmHg Transmembrane Pressure (mmHg): 80 mmHg Ultrafiltration Rate (mL/min): 650 mL/min Dialysate Flow Rate (mL/min): 500 ml/min Conductivity: Machine : 13.8 Conductivity: Machine : 13.8 Dialysis Fluid Bolus: Normal Saline Bolus Amount (mL): 200 mL  Frustrated about vascular procedure yesterday.  Permacath placed in right chest.  States he was not informed of why permacath was placed or how procedure went.   Objective:  Vital signs in last 24 hours:  Temp:  [96.8 F (36 C)-98.9 F (37.2 C)] 97.8 F (36.6 C) (12/21 0858) Pulse Rate:  [75-93] 84 (12/21 1145) Resp:  [4-23] 16 (12/21 1145) BP: (86-195)/(63-117) 105/73 (12/21 1145) SpO2:  [96 %-100 %] 99 % (12/21 1145) Weight:  [133 kg-136.5 kg] 136.5 kg (12/21 0858)  Weight change: 0 kg Filed Weights   04/16/21 1859 04/16/21 2149 04/17/21 0858  Weight: 133.2 kg 133 kg (!) 136.5 kg    Intake/Output: I/O last 3 completed shifts: In: 109.3 [P.O.:60; I.V.:49.3] Out: 1462 [Other:1462]   Intake/Output this shift:  No intake/output data recorded.  Physical Exam: General: NAD, resting in bed  Head: Normocephalic, atraumatic. Moist oral mucosal membranes  Eyes: Anicteric  Lungs:  Clear to auscultation, normal effort  Heart: Regular rate and rhythm  Abdomen:  Soft, nontender,   Extremities:  no peripheral edema.   Neurologic: Nonfocal, moving all four extremities  Skin: No lesions  Access: LUE AVF (no bruit/thrill), right PermCath placed on 78/46/9629    Basic Metabolic Panel: Recent Labs  Lab 04/15/21 0801 04/16/21 0450 04/17/21 0510  NA 135 134* 128*  K 5.6* 4.8 5.7*  CL 99 98 93*  CO2 23 20* 18*  GLUCOSE 169* 312* 579*  BUN 74* 88* 81*  CREATININE 16.37* 18.26* 16.09*  CALCIUM 7.3* 7.8* 7.7*     Liver Function Tests: Recent Labs  Lab 04/15/21 0801 04/16/21 0450  AST 14* 11*  ALT 11 9  ALKPHOS 106 103  BILITOT 0.6 0.7  PROT 7.7 7.3  ALBUMIN 3.6 3.4*    No results for input(s): LIPASE, AMYLASE in the last 168 hours. No results for input(s): AMMONIA in the last 168 hours.  CBC: Recent Labs  Lab 04/15/21 0801 04/16/21 0450 04/17/21 0510  WBC 9.9 7.0 7.8  NEUTROABS 7.3 4.8  --   HGB 11.8* 11.8* 12.6*  HCT 37.9* 37.4* 39.5  MCV 87.9 89.3 85.9  PLT 170 164 158     Cardiac Enzymes: No results for input(s): CKTOTAL, CKMB, CKMBINDEX, TROPONINI in the last 168 hours.  BNP: Invalid input(s): POCBNP  CBG: Recent Labs  Lab 04/16/21 1150 04/16/21 1617 04/16/21 2214 04/17/21 0423 04/17/21 0806  GLUCAP 214* 323* 524* 509* 379*     Microbiology: Results for orders placed or performed during the hospital encounter of 04/15/21  Resp Panel by RT-PCR (Flu A&B, Covid) Nasopharyngeal Swab     Status: None   Collection Time: 04/15/21 10:08 PM   Specimen: Nasopharyngeal Swab; Nasopharyngeal(NP) swabs in vial transport medium  Result  Value Ref Range Status   SARS Coronavirus 2 by RT PCR NEGATIVE NEGATIVE Final    Comment: (NOTE) SARS-CoV-2 target nucleic acids are NOT DETECTED.  The SARS-CoV-2 RNA is generally detectable in upper respiratory specimens during the acute phase of infection. The lowest concentration of SARS-CoV-2 viral copies this assay can detect is 138 copies/mL. A negative result does not preclude SARS-Cov-2 infection and should not be used as the  sole basis for treatment or other patient management decisions. A negative result may occur with  improper specimen collection/handling, submission of specimen other than nasopharyngeal swab, presence of viral mutation(s) within the areas targeted by this assay, and inadequate number of viral copies(<138 copies/mL). A negative result must be combined with clinical observations, patient history, and epidemiological information. The expected result is Negative.  Fact Sheet for Patients:  EntrepreneurPulse.com.au  Fact Sheet for Healthcare Providers:  IncredibleEmployment.be  This test is no t yet approved or cleared by the [place] FDA and  has been authorized for detection and/or diagnosis of SARS-CoV-2 by FDA under an Emergency Use Authorization (EUA). This EUA will remain  in effect (meaning this test can be used) for the duration of the COVID-19 declaration under Section 564(b)(1) of the Act, 21 U.S.C.section 360bbb-3(b)(1), unless the authorization is terminated  or revoked sooner.       Influenza A by PCR NEGATIVE NEGATIVE Final   Influenza B by PCR NEGATIVE NEGATIVE Final    Comment: (NOTE) The Xpert Xpress SARS-CoV-2/FLU/RSV plus assay is intended as an aid in the diagnosis of influenza from Nasopharyngeal swab specimens and should not be used as a sole basis for treatment. Nasal washings and aspirates are unacceptable for Xpert Xpress SARS-CoV-2/FLU/RSV testing.  Fact Sheet for Patients: EntrepreneurPulse.com.au  Fact Sheet for Healthcare Providers: IncredibleEmployment.be  This test is not yet approved or cleared by the [place] FDA and has been authorized for detection and/or diagnosis of SARS-CoV-2 by FDA under an Emergency Use Authorization (EUA). This EUA will remain in effect (meaning this test can be used) for the duration of the COVID-19 declaration under Section 564(b)(1) of the  Act, 21 U.S.C. section 360bbb-3(b)(1), unless the authorization is terminated or revoked.  Performed at [facility], [address] [place], [place] [address]     Coagulation Studies: No results for input(s): LABPROT, INR in the last 72 hours.  Urinalysis: No results for input(s): COLORURINE, LABSPEC, PHURINE, GLUCOSEU, HGBUR, BILIRUBINUR, KETONESUR, PROTEINUR, UROBILINOGEN, NITRITE, LEUKOCYTESUR in the last 72 hours.  Invalid input(s): APPERANCEUR    Imaging: PERIPHERAL VASCULAR CATHETERIZATION  Result Date: 04/16/2021 See surgical note for result.    Medications:    sodium chloride     sodium chloride      aspirin EC  81 mg Oral q1800   calcium acetate  667 mg Oral BID WC   carvedilol  25 mg Oral BID WC   Chlorhexidine Gluconate Cloth  6 each Topical Q0600   cinacalcet  60 mg Oral Q supper   heparin  5,000 Units Subcutaneous Q8H   heparin sodium (porcine)       hydrOXYzine  25 mg Oral QHS   insulin aspart  0-5 Units Subcutaneous QHS   insulin aspart  0-6 Units Subcutaneous TID WC   insulin detemir  10 Units Subcutaneous Daily   losartan  50 mg Oral Daily   pantoprazole  40 mg Oral Daily   patiromer  16.8 g Oral Daily     Assessment/ Plan:  [name]  is a 45 y.o.  male with past medical history of CHF, diabetes, GERD, hypertension, anemia and end stage renal disease on dialysis.  Patient presents to the emergency room with complaints of a clotted HD access. Patient admitted for Hyperkalemia [E87.5] 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[place]) [T82.898A] Arteriovenous fistula occlusion, initial encounter ([place]) [T82.898A]  [facility]/MWF/ LUE AVF  Hyperkalemia with end stage renal disease  Potassium elevated this a.m. to 5.7.  Will correct with planned dialysis treatment today.  Received dialysis yesterday with attempted 2.5 to 3 L fluid removal.  Patient became hypotensive.  UF achieved of 1.46 L.  Patient requesting UF 5 L today.  UF set at 3 L, but  was also reduced due to patient becoming hypotensive.  Next treatment scheduled for Friday, if remains inpatient.  Anemia of chronic kidney disease  Lab Results  Component Value Date   HGB 12.6 (L) 04/17/2021  We will continue to monitor.  ESA's held  3. Secondary Hyperparathyroidism: Lab Results  Component Value Date   CALCIUM 7.7 (L) 04/17/2021   PHOS 7.9 (H) 09/24/2020    Corrected calcium of 8.3.  Phosphorus remains elevated.  Receiving calcium acetate with meals 3 times daily.  4. AVF occlusion  Fistulogram completed on 04/16/2021 and required revision and probable HERO graft. Vascular will follow up with this outpatient.  Right chest PermCath placed for HD access.    LOS: 0 Tee Richeson 12/21/202212:58 PM

## 2021-04-17 NOTE — Discharge Summary (Signed)
Physician Discharge Summary  [name] [name] CHE:[id number] DOB: 03-19-1976 DOA: 04/15/2021  PCP: Danelle Berry, NP  Admit date: 04/15/2021 Discharge date: 04/17/2021  Discharge disposition: Home   Recommendations for Outpatient Follow-Up:   Follow-up with Dr. Lucky Cowboy, vascular surgeon, in 2 weeks. Continue outpatient hemodialysis as scheduled on Mondays, Wednesdays and Fridays.   Discharge Diagnosis:   Principal Problem:   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HCC) Active Problems:   ESRD on hemodialysis ([place]) - M, W, F via LUE AV fistula   Diabetes mellitus with ESRD (end-stage renal disease) ([place])   Hypertension   Uncontrolled type 2 diabetes mellitus with hyperglycemia, with long-term current use of insulin (HCC)   Coronary artery disease   Obesity, Class III, BMI 40-49.9 (morbid obesity) ([place])    Discharge Condition: Stable.  Diet recommendation:  Diet Order             Diet - low sodium heart healthy           Diet renal/carb modified with fluid restriction Diet-HS Snack? Nothing; Fluid restriction: 1200 mL Fluid; Room service appropriate? Yes; Fluid consistency: Thin  Diet effective now                     Code Status: Full Code     Hospital Course:    [name] [name] is a 45 year old man with medical history significant for type II DM, ESRD on hemodialysis via left upper extremity AV fistula, hypertension, who was referred from the outpatient dialysis center to the emergency room because of dysfunctional left upper extremity AV fistula.  Outpatient dialysis team for order to assess his AV fistula despite multiple attempts.  He was evaluated by the vascular surgeon and a tunneled dialysis catheter was inserted in the right IJ vein.  He was successfully dialyzed through the new permacath.  He had hyperkalemia which was expected to improve after hemodialysis.  He also has severe hyperglycemia that was treated with insulin.  Dr. Delana Meyer, vascular surgeon,  recommended outpatient follow-up with Dr. Lucky Cowboy for surgical repair of left upper extremity AV fistula.  His condition has improved and is deemed stable for discharge to home.  Discharge plan was discussed with the patient and his mother at the bedside.   Medical Consultants:   Vascular surgeon, Dr. Delana Meyer Nephrologist, Dr. Holley Raring   Discharge Exam:    Vitals:   04/17/21 1130 04/17/21 1145 04/17/21 1333 04/17/21 1544  BP: (!) 86/68 105/73 119/79 (!) 132/96  Pulse: 83 84 88 90  Resp: 11 16 17 17   Temp:   98.2 F (36.8 C) 98.4 F (36.9 C)  TempSrc:      SpO2: 99% 99% 97% 95%  Weight:      Height:         GEN: NAD SKIN: Warm and dry.  Permacath on the right upper chest EYES: No pallor or icterus ENT: MMM CV: RRR PULM: CTA B ABD: soft, ND, NT, +BS CNS: AAO x 3, non focal EXT: No edema or tenderness   The results of significant diagnostics from this hospitalization (including imaging, microbiology, ancillary and laboratory) are listed below for reference.     Procedures and Diagnostic Studies:   PERIPHERAL VASCULAR CATHETERIZATION  Result Date: 04/16/2021 See surgical note for result.    Labs:   Basic Metabolic Panel: Recent Labs  Lab 04/15/21 0801 04/16/21 0450 04/17/21 0510  NA 135 134* 128*  K 5.6* 4.8 5.7*  CL 99 98 93*  CO2 23 20*  18*  GLUCOSE 169* 312* 579*  BUN 74* 88* 81*  CREATININE 16.37* 18.26* 16.09*  CALCIUM 7.3* 7.8* 7.7*   GFR Estimated Creatinine Clearance: 8.2 mL/min (A) (by C-G formula based on SCr of 16.09 mg/dL (H)). Liver Function Tests: Recent Labs  Lab 04/15/21 0801 04/16/21 0450  AST 14* 11*  ALT 11 9  ALKPHOS 106 103  BILITOT 0.6 0.7  PROT 7.7 7.3  ALBUMIN 3.6 3.4*   No results for input(s): LIPASE, AMYLASE in the last 168 hours. No results for input(s): AMMONIA in the last 168 hours. Coagulation profile No results for input(s): INR, PROTIME in the last 168 hours.  CBC: Recent Labs  Lab 04/15/21 0801  04/16/21 0450 04/17/21 0510  WBC 9.9 7.0 7.8  NEUTROABS 7.3 4.8  --   HGB 11.8* 11.8* 12.6*  HCT 37.9* 37.4* 39.5  MCV 87.9 89.3 85.9  PLT 170 164 158   Cardiac Enzymes: No results for input(s): CKTOTAL, CKMB, CKMBINDEX, TROPONINI in the last 168 hours. BNP: Invalid input(s): POCBNP CBG: Recent Labs  Lab 04/16/21 2214 04/17/21 0423 04/17/21 0806 04/17/21 1331 04/17/21 1614  GLUCAP 524* 509* 379* 268* 351*   D-Dimer No results for input(s): DDIMER in the last 72 hours. Hgb A1c Recent Labs    04/15/21 0801  HGBA1C 9.7*   Lipid Profile No results for input(s): CHOL, HDL, LDLCALC, TRIG, CHOLHDL, LDLDIRECT in the last 72 hours. Thyroid function studies No results for input(s): TSH, T4TOTAL, T3FREE, THYROIDAB in the last 72 hours.  Invalid input(s): FREET3 Anemia work up No results for input(s): VITAMINB12, FOLATE, FERRITIN, TIBC, IRON, RETICCTPCT in the last 72 hours. Microbiology Recent Results (from the past 240 hour(s))  Resp Panel by RT-PCR (Flu A&B, Covid) Nasopharyngeal Swab     Status: None   Collection Time: 04/15/21 10:08 PM   Specimen: Nasopharyngeal Swab; Nasopharyngeal(NP) swabs in vial transport medium  Result Value Ref Range Status   SARS Coronavirus 2 by RT PCR NEGATIVE NEGATIVE Final    Comment: (NOTE) SARS-CoV-2 target nucleic acids are NOT DETECTED.  The SARS-CoV-2 RNA is generally detectable in upper respiratory specimens during the acute phase of infection. The lowest concentration of SARS-CoV-2 viral copies this assay can detect is 138 copies/mL. A negative result does not preclude SARS-Cov-2 infection and should not be used as the sole basis for treatment or other patient management decisions. A negative result may occur with  improper specimen collection/handling, submission of specimen other than nasopharyngeal swab, presence of viral mutation(s) within the areas targeted by this assay, and inadequate number of viral copies(<138  copies/mL). A negative result must be combined with clinical observations, patient history, and epidemiological information. The expected result is Negative.  Fact Sheet for Patients:  EntrepreneurPulse.com.au  Fact Sheet for Healthcare Providers:  IncredibleEmployment.be  This test is no t yet approved or cleared by the [place] FDA and  has been authorized for detection and/or diagnosis of SARS-CoV-2 by FDA under an Emergency Use Authorization (EUA). This EUA will remain  in effect (meaning this test can be used) for the duration of the COVID-19 declaration under Section 564(b)(1) of the Act, 21 U.S.C.section 360bbb-3(b)(1), unless the authorization is terminated  or revoked sooner.       Influenza A by PCR NEGATIVE NEGATIVE Final   Influenza B by PCR NEGATIVE NEGATIVE Final    Comment: (NOTE) The Xpert Xpress SARS-CoV-2/FLU/RSV plus assay is intended as an aid in the diagnosis of influenza from Nasopharyngeal swab specimens and should not be  used as a sole basis for treatment. Nasal washings and aspirates are unacceptable for Xpert Xpress SARS-CoV-2/FLU/RSV testing.  Fact Sheet for Patients: EntrepreneurPulse.com.au  Fact Sheet for Healthcare Providers: IncredibleEmployment.be  This test is not yet approved or cleared by the [place] FDA and has been authorized for detection and/or diagnosis of SARS-CoV-2 by FDA under an Emergency Use Authorization (EUA). This EUA will remain in effect (meaning this test can be used) for the duration of the COVID-19 declaration under Section 564(b)(1) of the Act, 21 U.S.C. section 360bbb-3(b)(1), unless the authorization is terminated or revoked.  Performed at [facility], [address] [place], [place] [address]      Discharge Instructions:   Discharge Instructions     Diet - low sodium heart healthy   Complete by: As directed     Discharge instructions   Complete by: As directed    Follow-up with Dr. Lucky Cowboy, vascular surgeon, for surgery on clotted left arm AV fistula.  Continue outpatient hemodialysis shortness on Mondays, Wednesdays and Fridays.   Increase activity slowly   Complete by: As directed    No wound care   Complete by: As directed       Allergies as of 04/17/2021       Reactions   Cephalexin Nausea Only   Ivp Dye [iodinated Diagnostic Agents] Diarrhea   Lisinopril Other (See Comments)   Pt cannot recall reaction, cough   Other         Medication List     STOP taking these medications    calcium acetate 667 MG capsule Commonly known as: PHOSLO   clindamycin 1 % gel Commonly known as: CLINDAGEL   hydrOXYzine 25 MG tablet Commonly known as: ATARAX   lidocaine-prilocaine cream Commonly known as: EMLA   mupirocin ointment 2 % Commonly known as: BACTROBAN   pantoprazole 40 MG tablet Commonly known as: PROTONIX   sildenafil 20 MG tablet Commonly known as: REVATIO       TAKE these medications    aspirin EC 81 MG tablet Take 81 mg by mouth daily at 6 PM. (1700)   carvedilol 25 MG tablet Commonly known as: COREG Take 25 mg by mouth 2 (two) times daily.   cinacalcet 60 MG tablet Commonly known as: SENSIPAR Take 1 tablet by mouth daily. What changed: Another medication with the same name was removed. Continue taking this medication, and follow the directions you see here.   HumuLIN 70/30 (70-30) 100 UNIT/ML injection Generic drug: insulin NPH-regular Human Inject 10-30 Units into the skin See admin instructions. 10 units before breakfast and 30 units before supper ON DIALYSIS DAYS (M-W-F), NO AM INSULIN What changed: Another medication with the same name was removed. Continue taking this medication, and follow the directions you see here.   loperamide 2 MG capsule Commonly known as: IMODIUM Take 2 mg by mouth as needed for diarrhea or loose stools (Take 1/2-1 tablet as  meeded to prevent diarrhea. Can take up to 1 tablet every 4 hours. Do not exceed 4 tablets in a day.).   losartan 50 MG tablet Commonly known as: COZAAR Take 50 mg by mouth daily. (1700)   omeprazole 40 MG capsule Commonly known as: PRILOSEC Take 40 mg by mouth daily. What changed: Another medication with the same name was removed. Continue taking this medication, and follow the directions you see here.   sevelamer carbonate 2.4 g Pack Commonly known as: RENVELA Take 2.4 g by mouth 3 (three) times daily with  meals.   vitamin A 25000 UNIT capsule Take 25,000 Units by mouth daily.   vitamin E 1000 UNIT capsule Take 1,000 Units by mouth daily. Not sure of dose; started 4/29        Follow-up Information     Dew, Erskine Squibb, MD Follow up in 2 week(s).   Specialties: Vascular Surgery, Radiology, Interventional Cardiology Why: follow up after procedure no studies Contact information: [place] [id number] [phone]                   If you experience worsening of your admission symptoms, develop shortness of breath, life threatening emergency, suicidal or homicidal thoughts you must seek medical attention immediately by calling 911 or calling your MD immediately  if symptoms less severe.   You must read complete instructions/literature along with all the possible adverse reactions/side effects for all the medicines you take and that have been prescribed to you. Take any new medicines after you have completely understood and accept all the possible adverse reactions/side effects.    Please note   You were cared for by a hospitalist during your hospital stay. If you have any questions about your discharge medications or the care you received while you were in the hospital after you are discharged, you can call the unit and asked to speak with the hospitalist on call if the hospitalist that took care of you is not available. Once you are discharged, your  primary care physician will handle any further medical issues. Please note that NO REFILLS for any discharge medications will be authorized once you are discharged, as it is imperative that you return to your primary care physician (or establish a relationship with a primary care physician if you do not have one) for your aftercare needs so that they can reassess your need for medications and monitor your lab values.       Time coordinating discharge: 33 minutes  Signed:  Taavi Hoose  [facility] 04/17/2021, 10:07 PM   Pager on [email or web address]. If 7PM-7AM, please contact night-coverage at [email or web address]

## 2021-04-17 NOTE — TOC Progression Note (Signed)
Transition of Care [facility]) - Progression Note    Patient Details  Name: [name] [name] MRN: [id number] Date of Birth: 1976/02/18  Transition of Care [facility]) CM/SW Contact  Beverly Sessions, RN Phone Number: 04/17/2021, 10:24 AM  Clinical Narrative:      Transition of Care (TOC) Screening Note   Patient Details  Name: [name] [name] Date of Birth: [birth year]   Transition of Care [facility]) CM/SW Contact:    Beverly Sessions, RN Phone Number: 04/17/2021, 10:25 AM    Transition of Care Department (TOC) has reviewed patient and no TOC needs have been identified at this time. We will continue to monitor patient advancement through interdisciplinary progression rounds. If new patient transition needs arise, please place a TOC consult.  c       Expected Discharge Plan and Services                                                 Social Determinants of Health (SDOH) Interventions    Readmission Risk Interventions No flowsheet data found.

## 2021-04-17 NOTE — Progress Notes (Signed)
Patient doing well post catheter insertion.  I discussed with him possibility of a hero graft on the left.  Okay for discharge.  He will follow-up in the office in approximately 2 weeks

## 2021-04-17 NOTE — Progress Notes (Signed)
Pt blood sugar is 509. Dr Hal Hope informed. 10 units novolog given and rpt BS after 4hrs as per Dr. Abbott Pao alert and orientedx4.

## 2021-04-17 NOTE — Care Management (Signed)
Amanda Morris dialysis liaison notified of admission.    

## 2021-04-18 ENCOUNTER — Encounter: Payer: Self-pay | Admitting: Vascular Surgery

## 2021-04-30 ENCOUNTER — Ambulatory Visit (INDEPENDENT_AMBULATORY_CARE_PROVIDER_SITE_OTHER): Payer: Medicare Other | Admitting: Vascular Surgery

## 2021-04-30 ENCOUNTER — Encounter (INDEPENDENT_AMBULATORY_CARE_PROVIDER_SITE_OTHER): Payer: Self-pay | Admitting: Vascular Surgery

## 2021-04-30 VITALS — BP 113/77 | HR 84 | Resp 16 | Wt 284.4 lb

## 2021-04-30 DIAGNOSIS — I10 Essential (primary) hypertension: Secondary | ICD-10-CM

## 2021-04-30 DIAGNOSIS — E1122 Type 2 diabetes mellitus with diabetic chronic kidney disease: Secondary | ICD-10-CM | POA: Diagnosis not present

## 2021-04-30 DIAGNOSIS — N186 End stage renal disease: Secondary | ICD-10-CM

## 2021-04-30 DIAGNOSIS — Z992 Dependence on renal dialysis: Secondary | ICD-10-CM | POA: Diagnosis not present

## 2021-04-30 NOTE — Assessment & Plan Note (Signed)
blood glucose control important in reducing the progression of atherosclerotic disease. Also, involved in wound healing. On appropriate medications.  

## 2021-04-30 NOTE — Assessment & Plan Note (Signed)
Likely an underlying cause of his renal failure and blood pressure control important in reducing the progression of atherosclerotic disease. On appropriate oral medications.  

## 2021-04-30 NOTE — Progress Notes (Signed)
MRN : [id number]  [name] [name] is a 46 y.o. ([birth year]) male who presents with chief complaint of  Chief Complaint  Patient presents with   Routine Post Op    [facility] follow up  .  History of Present Illness: Patient returns today in follow up of his dialysis access.  He was in the hospital recently and had a nonsalvageable left arm AV fistula and had to have a right jugular PermCath placed.  He was thrombosed to his subclavian vein and his left arm option would now be a hero graft.  That would still be an option as it appeared that flow refluxed up the jugular vein reasonably well on the left.  His catheter is working.  He has no fevers or chills.  He does desire to get this out sooner rather than later.  Current Outpatient Medications  Medication Sig Dispense Refill   aspirin EC 81 MG tablet Take 81 mg by mouth daily at 6 PM. (1700)     Beta Carotene (VITAMIN A) 25000 UNIT capsule Take 25,000 Units by mouth daily.     carvedilol (COREG) 25 MG tablet Take 25 mg by mouth 2 (two) times daily.     cinacalcet (SENSIPAR) 60 MG tablet Take 1 tablet by mouth daily.     cyclobenzaprine (FLEXERIL) 10 MG tablet Take 5 mg by mouth 2 (two) times daily as needed.     HUMULIN 70/30 (70-30) 100 UNIT/ML injection Inject 10-30 Units into the skin See admin instructions. 10 units before breakfast and 30 units before supper ON DIALYSIS DAYS (M-W-F), NO AM INSULIN     loperamide (IMODIUM) 2 MG capsule Take 2 mg by mouth as needed for diarrhea or loose stools (Take 1/2-1 tablet as meeded to prevent diarrhea. Can take up to 1 tablet every 4 hours. Do not exceed 4 tablets in a day.).     losartan (COZAAR) 50 MG tablet Take 50 mg by mouth daily. (1700)     omeprazole (PRILOSEC) 40 MG capsule Take 40 mg by mouth daily.     sevelamer carbonate (RENVELA) 2.4 g PACK Take 2.4 g by mouth 3 (three) times daily with meals.     vitamin E 1000 UNIT capsule Take 1,000 Units by mouth daily. Not sure of dose; started 4/29      No current facility-administered medications for this visit.    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   CHF (congestive heart failure) (HCC)    Coronary artery disease    Diabetes mellitus without complication ([place])    Dialysis patient [facility])    M-W-F   GERD (gastroesophageal reflux disease)    Hypertension    Kyrle's disease    Renal disorder    on dialysis for 4 years three times a week last was Tuesday   Secondary hyperparathyroidism of renal origin ([place])    Shortness of breath dyspnea    Type 2 diabetes mellitus with diabetic nephropathy (HCC)    Type 2 diabetes mellitus with diabetic retinopathy [facility])     Past Surgical History:  Procedure Laterality Date   A/V FISTULAGRAM Left 10/01/2017   Procedure: A/V FISTULAGRAM;  Surgeon: Algernon Huxley, MD;  Location: [place] [facility];  Service: Cardiovascular;  Laterality: Left;   A/V FISTULAGRAM Left 05/17/2020   Procedure: A/V FISTULAGRAM;  Surgeon: Algernon Huxley, MD;  Location: [place] [facility];  Service: Cardiovascular;  Laterality: Left;   A/V SHUNTOGRAM Left  04/16/2021   Procedure: A/V SHUNTOGRAM;  Surgeon: Katha Cabal, MD;  Location: [place] [facility];  Service: Cardiovascular;  Laterality: Left;   CATARACT EXTRACTION W/PHACO Right 06/30/2018   Procedure: CATARACT EXTRACTION PHACO AND INTRAOCULAR LENS PLACEMENT (IOC)-RIGHT;  Surgeon: Eulogio Bear, MD;  Location: [facility];  Service: Ophthalmology;  Laterality: Right;  [place] 00:07.0 CDE 0.16 FLUID PACK lOT # [id number]   CATARACT EXTRACTION W/PHACO Left 12/24/2018   Procedure: CATARACT EXTRACTION PHACO AND INTRAOCULAR LENS PLACEMENT (IOC);  Surgeon: Eulogio Bear, MD;  Location: [facility];  Service: Ophthalmology;  Laterality: Left;  [place] 00:30.0 CDE 2.01 FLUID PACK LOT # [id number]   COLONOSCOPY WITH PROPOFOL N/A 04/23/2017   Procedure: COLONOSCOPY WITH PROPOFOL;  Surgeon: [name], MD;  Location: [facility] ENDOSCOPY;   Service: Endoscopy;  Laterality: N/A;   COLONOSCOPY WITH PROPOFOL N/A 07/02/2017   Procedure: COLONOSCOPY WITH PROPOFOL;  Surgeon: [name], MD;  Location: [facility] ENDOSCOPY;  Service: Endoscopy;  Laterality: N/A;   DIALYSIS/PERMA CATHETER INSERTION N/A 09/27/2020   Procedure: DIALYSIS/PERMA CATHETER INSERTION;  Surgeon: Algernon Huxley, MD;  Location: [place] [facility];  Service: Cardiovascular;  Laterality: N/A;   DIALYSIS/PERMA CATHETER REMOVAL N/A 11/01/2020   Procedure: DIALYSIS/PERMA CATHETER REMOVAL;  Surgeon: Algernon Huxley, MD;  Location: [place] [facility];  Service: Cardiovascular;  Laterality: N/A;   ESOPHAGOGASTRODUODENOSCOPY (EGD) WITH PROPOFOL N/A 04/23/2017   Procedure: ESOPHAGOGASTRODUODENOSCOPY (EGD) WITH PROPOFOL;  Surgeon: [name], MD;  Location: [facility] ENDOSCOPY;  Service: Endoscopy;  Laterality: N/A;   ESOPHAGOGASTRODUODENOSCOPY (EGD) WITH PROPOFOL N/A 03/27/2020   Procedure: ESOPHAGOGASTRODUODENOSCOPY (EGD) WITH PROPOFOL;  Surgeon: Lesly Rubenstein, MD;  Location: [facility] ENDOSCOPY;  Service: Endoscopy;  Laterality: N/A;   EYE SURGERY     RETINA   INSERTION OF DIALYSIS CATHETER Right 08/09/2020   Procedure: INSERTION OF DIALYSIS CATHETER;  Surgeon: Algernon Huxley, MD;  Location: [facility];  Service: Vascular;  Laterality: Right;   PERIPHERAL VASCULAR CATHETERIZATION N/A 05/07/2015   Procedure: A/V Shuntogram/Fistulagram;  Surgeon: Katha Cabal, MD;  Location: [place] [facility];  Service: Cardiovascular;  Laterality: N/A;   PERIPHERAL VASCULAR CATHETERIZATION Left 05/07/2015   Procedure: A/V Shunt Intervention;  Surgeon: Katha Cabal, MD;  Location: [place] [facility];  Service: Cardiovascular;  Laterality: Left;   PERIPHERAL VASCULAR THROMBECTOMY Left 07/16/2020   Procedure: PERIPHERAL VASCULAR THROMBECTOMY;  Surgeon: Algernon Huxley, MD;  Location: [place] [facility];  Service: Cardiovascular;  Laterality: Left;   PERIPHERAL VASCULAR THROMBECTOMY Left  08/27/2020   Procedure: PERIPHERAL VASCULAR THROMBECTOMY;  Surgeon: Algernon Huxley, MD;  Location: [place] [facility];  Service: Cardiovascular;  Laterality: Left;   REVISON OF ARTERIOVENOUS FISTULA Left 08/09/2020   Procedure: REVISON OF ARTERIOVENOUS FISTULA;  Surgeon: Algernon Huxley, MD;  Location: [facility];  Service: Vascular;  Laterality: Left;   TEE WITHOUT CARDIOVERSION N/A 07/05/2015   Procedure: TRANSESOPHAGEAL ECHOCARDIOGRAM (TEE);  Surgeon: Dionisio David, MD;  Location: [facility];  Service: Cardiovascular;  Laterality: N/A;   UPPER EXTREMITY ANGIOGRAPHY Left 06/24/2018   Procedure: UPPER EXTREMITY ANGIOGRAPHY;  Surgeon: Algernon Huxley, MD;  Location: [place] [facility];  Service: Cardiovascular;  Laterality: Left;   WOUND DEBRIDEMENT N/A 09/25/2020   Procedure: DEBRIDEMENT WOUND;  Surgeon: Katha Cabal, MD;  Location: [facility];  Service: Vascular;  Laterality: N/A;     Social History   Tobacco Use   Smoking status: Never   Smokeless tobacco: Never  Vaping Use  Vaping Use: Never used  Substance Use Topics   Alcohol use: Not Currently    Comment: occ. shot   Drug use: No      Family History  Problem Relation Age of Onset   Diabetes Father     Allergies  Allergen Reactions   Cephalexin Nausea Only   Ivp Dye [Iodinated Contrast Media] Diarrhea   Lisinopril Other (See Comments)    Pt cannot recall reaction, cough   Other      REVIEW OF SYSTEMS (Negative unless checked)  Constitutional: [] Weight loss  [] Fever  [] Chills Cardiac: [] Chest pain   [] Chest pressure   [x] Palpitations   [] Shortness of breath when laying flat   [] Shortness of breath at rest   [x] Shortness of breath with exertion. Vascular:  [] Pain in legs with walking   [] Pain in legs at rest   [] Pain in legs when laying flat   [] Claudication   [] Pain in feet when walking  [] Pain in feet at rest  [] Pain in feet when laying flat   [] History of DVT   [] Phlebitis   [] Swelling in legs   [] Varicose veins    [] Non-healing ulcers Pulmonary:   [] Uses home oxygen   [] Productive cough   [] Hemoptysis   [] Wheeze  [] COPD   [] Asthma Neurologic:  [] Dizziness  [] Blackouts   [] Seizures   [] History of stroke   [] History of TIA  [] Aphasia   [] Temporary blindness   [] Dysphagia   [] Weakness or numbness in arms   [] Weakness or numbness in legs Musculoskeletal:  [x] Arthritis   [] Joint swelling   [] Joint pain   [] Low back pain Hematologic:  [] Easy bruising  [] Easy bleeding   [] Hypercoagulable state   [x] Anemic   Gastrointestinal:  [] Blood in stool   [] Vomiting blood  [] Gastroesophageal reflux/heartburn   [] Abdominal pain Genitourinary:  [x] Chronic kidney disease   [] Difficult urination  [] Frequent urination  [] Burning with urination   [] Hematuria Skin:  [] Rashes   [] Ulcers   [] Wounds Psychological:  [] History of anxiety   []  History of major depression.  Physical Examination  BP 113/77 (BP Location: Right Arm)    Pulse 84    Resp 16    Wt 284 lb 6.4 oz (129 kg)    BMI 37.52 kg/m  Gen:  WD/WN, NAD Head: [place]/AT, No temporalis wasting. Ear/Nose/Throat: Hearing grossly intact, nares w/o erythema or drainage Eyes: Conjunctiva clear. Sclera non-icteric Neck: Supple.  Trachea midline Pulmonary:  Good air movement, no use of accessory muscles.  Cardiac: RRR, no JVD Vascular: PermCath in place in right chest without erythema.  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  Thenar wasting is present bilaterally Neurologic: Sensation grossly intact in extremities.  Symmetrical.  Speech is fluent.  Psychiatric: Judgment intact, Mood & affect appropriate for pt's clinical situation. Dermatologic: No rashes or ulcers noted.  No cellulitis or open wounds.      Labs Recent Results (from the past 2160 hour(s))  CBC with Differential     Status: Abnormal   Collection Time: 04/15/21  8:01 AM  Result Value  Ref Range   WBC 9.9 4.0 - 10.5 K/uL   RBC 4.31 4.22 - 5.81 MIL/uL   Hemoglobin 11.8 (L) 13.0 - 17.0 g/dL   HCT 37.9 (L) 39.0 - 52.0 %   MCV 87.9 80.0 - 100.0 fL   MCH 27.4 26.0 -  34.0 pg   MCHC 31.1 30.0 - 36.0 g/dL   RDW 16.2 (H) 11.5 - 15.5 %   Platelets 170 150 - 400 K/uL   nRBC 0.0 0.0 - 0.2 %   Neutrophils Relative % 73 %   Neutro Abs 7.3 1.7 - 7.7 K/uL   Lymphocytes Relative 14 %   Lymphs Abs 1.3 0.7 - 4.0 K/uL   Monocytes Relative 7 %   Monocytes Absolute 0.7 0.1 - 1.0 K/uL   Eosinophils Relative 5 %   Eosinophils Absolute 0.5 0.0 - 0.5 K/uL   Basophils Relative 1 %   Basophils Absolute 0.1 0.0 - 0.1 K/uL   Immature Granulocytes 0 %   Abs Immature Granulocytes 0.03 0.00 - 0.07 K/uL    Comment: Performed at [facility], [address] [place], [place] [address]  Comprehensive metabolic panel     Status: Abnormal   Collection Time: 04/15/21  8:01 AM  Result Value Ref Range   Sodium 135 135 - 145 mmol/L   Potassium 5.6 (H) 3.5 - 5.1 mmol/L   Chloride 99 98 - 111 mmol/L   CO2 23 22 - 32 mmol/L   Glucose, Bld 169 (H) 70 - 99 mg/dL    Comment: Glucose reference range applies only to samples taken after fasting for at least 8 hours.   BUN 74 (H) 6 - 20 mg/dL   Creatinine, Ser 16.37 (H) 0.61 - 1.24 mg/dL   Calcium 7.3 (L) 8.9 - 10.3 mg/dL   Total Protein 7.7 6.5 - 8.1 g/dL   Albumin 3.6 3.5 - 5.0 g/dL   AST 14 (L) 15 - 41 U/L   ALT 11 0 - 44 U/L   Alkaline Phosphatase 106 38 - 126 U/L   Total Bilirubin 0.6 0.3 - 1.2 mg/dL   GFR, Estimated 3 (L) >60 mL/min    Comment: (NOTE) Calculated using the CKD-EPI Creatinine Equation (2021)    Anion gap 13 5 - 15    Comment: Performed at [facility], [address] [place],  [address]  Hemoglobin A1c     Status: Abnormal   Collection Time: 04/15/21  8:01 AM  Result Value Ref Range   Hgb A1c MFr Bld 9.7 (H) 4.8 - 5.6 %    Comment: (NOTE)         Prediabetes: 5.7 - 6.4         Diabetes: >6.4          Glycemic control for adults with diabetes: <7.0    Mean Plasma Glucose 232 mg/dL    Comment: (NOTE) Performed At: [facility] [place], [place] [id number] Rush Farmer MD [id number]   CBG monitoring, ED     Status: Abnormal   Collection Time: 04/15/21  8:21 PM  Result Value Ref Range   Glucose-Capillary 239 (H) 70 - 99 mg/dL    Comment: Glucose reference range applies only to samples taken after fasting for at least 8 hours.  CBG monitoring, ED     Status: Abnormal   Collection Time: 04/15/21  9:41 PM  Result Value Ref Range   Glucose-Capillary 272 (H) 70 - 99 mg/dL    Comment: Glucose reference range applies only to samples taken after fasting for at least 8 hours.  Resp Panel by RT-PCR (Flu A&B, Covid) Nasopharyngeal Swab     Status: None   Collection Time: 04/15/21 10:08 PM   Specimen: Nasopharyngeal Swab; Nasopharyngeal(NP) swabs in vial transport medium  Result Value Ref Range  SARS Coronavirus 2 by RT PCR NEGATIVE NEGATIVE    Comment: (NOTE) SARS-CoV-2 target nucleic acids are NOT DETECTED.  The SARS-CoV-2 RNA is generally detectable in upper respiratory specimens during the acute phase of infection. The lowest concentration of SARS-CoV-2 viral copies this assay can detect is 138 copies/mL. A negative result does not preclude SARS-Cov-2 infection and should not be used as the sole basis for treatment or other patient management decisions. A negative result may occur with  improper specimen collection/handling, submission of specimen other than nasopharyngeal swab, presence of viral mutation(s) within the areas targeted by this assay, and inadequate number of viral copies(<138 copies/mL). A negative result must be combined with clinical observations, patient history, and epidemiological information. The expected result is Negative.  Fact Sheet for Patients:  EntrepreneurPulse.com.au  Fact Sheet for Healthcare Providers:   IncredibleEmployment.be  This test is no t yet approved or cleared by the [place] FDA and  has been authorized for detection and/or diagnosis of SARS-CoV-2 by FDA under an Emergency Use Authorization (EUA). This EUA will remain  in effect (meaning this test can be used) for the duration of the COVID-19 declaration under Section 564(b)(1) of the Act, 21 U.S.C.section 360bbb-3(b)(1), unless the authorization is terminated  or revoked sooner.       Influenza A by PCR NEGATIVE NEGATIVE   Influenza B by PCR NEGATIVE NEGATIVE    Comment: (NOTE) The Xpert Xpress SARS-CoV-2/FLU/RSV plus assay is intended as an aid in the diagnosis of influenza from Nasopharyngeal swab specimens and should not be used as a sole basis for treatment. Nasal washings and aspirates are unacceptable for Xpert Xpress SARS-CoV-2/FLU/RSV testing.  Fact Sheet for Patients: EntrepreneurPulse.com.au  Fact Sheet for Healthcare Providers: IncredibleEmployment.be  This test is not yet approved or cleared by the [place] FDA and has been authorized for detection and/or diagnosis of SARS-CoV-2 by FDA under an Emergency Use Authorization (EUA). This EUA will remain in effect (meaning this test can be used) for the duration of the COVID-19 declaration under Section 564(b)(1) of the Act, 21 U.S.C. section 360bbb-3(b)(1), unless the authorization is terminated or revoked.  Performed at [facility], [address] [place], [place] [address]   Hepatitis B surface antigen     Status: None   Collection Time: 04/15/21 10:11 PM  Result Value Ref Range   Hepatitis B Surface Ag NON REACTIVE NON REACTIVE    Comment: Performed at [facility] [address], [place], [place] [address]  Hepatitis B surface antibody     Status: Abnormal   Collection Time: 04/15/21 10:11 PM  Result Value Ref Range   Hep B S Ab Reactive (A) NON REACTIVE     Comment: (NOTE) Consistent with immunity, greater than 9.9 mIU/mL.  Performed at [facility], [place] [address], [place], [place] [address]   Hepatitis B surface antibody,quantitative     Status: None   Collection Time: 04/15/21 10:11 PM  Result Value Ref Range   Hepatitis B-Post 15.0 Immunity>9.9 mIU/mL    Comment: (NOTE)  Status of Immunity                     Anti-HBs Level  ------------------                     -------------- Inconsistent with Immunity                   0.0 - 9.9 Consistent with  Immunity                          >9.9 Performed At: [facility] [place], [place] [id number] Rush Farmer MD [id number]   CBC with Differential/Platelet     Status: Abnormal   Collection Time: 04/16/21  4:50 AM  Result Value Ref Range   WBC 7.0 4.0 - 10.5 K/uL   RBC 4.19 (L) 4.22 - 5.81 MIL/uL   Hemoglobin 11.8 (L) 13.0 - 17.0 g/dL   HCT 37.4 (L) 39.0 - 52.0 %   MCV 89.3 80.0 - 100.0 fL   MCH 28.2 26.0 - 34.0 pg   MCHC 31.6 30.0 - 36.0 g/dL   RDW 16.2 (H) 11.5 - 15.5 %   Platelets 164 150 - 400 K/uL   nRBC 0.0 0.0 - 0.2 %   Neutrophils Relative % 69 %   Neutro Abs 4.8 1.7 - 7.7 K/uL   Lymphocytes Relative 16 %   Lymphs Abs 1.1 0.7 - 4.0 K/uL   Monocytes Relative 8 %   Monocytes Absolute 0.6 0.1 - 1.0 K/uL   Eosinophils Relative 6 %   Eosinophils Absolute 0.4 0.0 - 0.5 K/uL   Basophils Relative 1 %   Basophils Absolute 0.1 0.0 - 0.1 K/uL   Immature Granulocytes 0 %   Abs Immature Granulocytes 0.02 0.00 - 0.07 K/uL    Comment: Performed at [facility], [address] [place], [place] [address]  Comprehensive metabolic panel     Status: Abnormal   Collection Time: 04/16/21  4:50 AM  Result Value Ref Range   Sodium 134 (L) 135 - 145 mmol/L   Potassium 4.8 3.5 - 5.1 mmol/L   Chloride 98 98 - 111 mmol/L   CO2 20 (L) 22 - 32 mmol/L   Glucose, Bld 312 (H) 70 - 99 mg/dL    Comment: Glucose reference range applies only to samples  taken after fasting for at least 8 hours.   BUN 88 (H) 6 - 20 mg/dL   Creatinine, Ser 18.26 (H) 0.61 - 1.24 mg/dL   Calcium 7.8 (L) 8.9 - 10.3 mg/dL   Total Protein 7.3 6.5 - 8.1 g/dL   Albumin 3.4 (L) 3.5 - 5.0 g/dL   AST 11 (L) 15 - 41 U/L   ALT 9 0 - 44 U/L   Alkaline Phosphatase 103 38 - 126 U/L   Total Bilirubin 0.7 0.3 - 1.2 mg/dL   GFR, Estimated 3 (L) >60 mL/min    Comment: (NOTE) Calculated using the CKD-EPI Creatinine Equation (2021)    Anion gap 16 (H) 5 - 15    Comment: Performed at [facility], [address] [place],  [address]  CBG monitoring, ED     Status: Abnormal   Collection Time: 04/16/21  8:16 AM  Result Value Ref Range   Glucose-Capillary 250 (H) 70 - 99 mg/dL    Comment: Glucose reference range applies only to samples taken after fasting for at least 8 hours.  Glucose, capillary     Status: Abnormal   Collection Time: 04/16/21 11:50 AM  Result Value Ref Range   Glucose-Capillary 214 (H) 70 - 99 mg/dL    Comment: Glucose reference range applies only to samples taken after fasting for at least 8 hours.  Glucose, capillary     Status: Abnormal   Collection Time: 04/16/21  4:17 PM  Result Value Ref Range   Glucose-Capillary 323 (H) 70 -  99 mg/dL    Comment: Glucose reference range applies only to samples taken after fasting for at least 8 hours.  Glucose, capillary     Status: Abnormal   Collection Time: 04/16/21 10:14 PM  Result Value Ref Range   Glucose-Capillary 524 (HH) 70 - 99 mg/dL    Comment: Glucose reference range applies only to samples taken after fasting for at least 8 hours.   Comment 1 Notify RN   Glucose, capillary     Status: Abnormal   Collection Time: 04/17/21  4:23 AM  Result Value Ref Range   Glucose-Capillary 509 (HH) 70 - 99 mg/dL    Comment: Glucose reference range applies only to samples taken after fasting for at least 8 hours.  CBC     Status: Abnormal   Collection Time: 04/17/21  5:10 AM  Result Value Ref  Range   WBC 7.8 4.0 - 10.5 K/uL   RBC 4.60 4.22 - 5.81 MIL/uL   Hemoglobin 12.6 (L) 13.0 - 17.0 g/dL   HCT 39.5 39.0 - 52.0 %   MCV 85.9 80.0 - 100.0 fL   MCH 27.4 26.0 - 34.0 pg   MCHC 31.9 30.0 - 36.0 g/dL   RDW 15.9 (H) 11.5 - 15.5 %   Platelets 158 150 - 400 K/uL   nRBC 0.0 0.0 - 0.2 %    Comment: Performed at [facility], [address] [place], [place] [address]  Basic metabolic panel     Status: Abnormal   Collection Time: 04/17/21  5:10 AM  Result Value Ref Range   Sodium 128 (L) 135 - 145 mmol/L   Potassium 5.7 (H) 3.5 - 5.1 mmol/L   Chloride 93 (L) 98 - 111 mmol/L   CO2 18 (L) 22 - 32 mmol/L   Glucose, Bld 579 (HH) 70 - 99 mg/dL    Comment: CRITICAL RESULT CALLED TO, READ BACK BY AND VERIFIED WITH MARY ANN [email or web address]  04/17/21 RH Glucose reference range applies only to samples taken after fasting for at least 8 hours.    BUN 81 (H) 6 - 20 mg/dL   Creatinine, Ser 16.09 (H) 0.61 - 1.24 mg/dL   Calcium 7.7 (L) 8.9 - 10.3 mg/dL   GFR, Estimated 3 (L) >60 mL/min    Comment: (NOTE) Calculated using the CKD-EPI Creatinine Equation (2021)    Anion gap 17 (H) 5 - 15    Comment: Performed at [facility], [address] [place], [place] [address]  Glucose, capillary     Status: Abnormal   Collection Time: 04/17/21  8:06 AM  Result Value Ref Range   Glucose-Capillary 379 (H) 70 - 99 mg/dL    Comment: Glucose reference range applies only to samples taken after fasting for at least 8 hours.  Glucose, capillary     Status: Abnormal   Collection Time: 04/17/21  1:31 PM  Result Value Ref Range   Glucose-Capillary 268 (H) 70 - 99 mg/dL    Comment: Glucose reference range applies only to samples taken after fasting for at least 8 hours.  Glucose, capillary     Status: Abnormal   Collection Time: 04/17/21  4:14 PM  Result Value Ref Range   Glucose-Capillary 351 (H) 70 - 99 mg/dL    Comment: Glucose reference range applies only to samples taken after fasting  for at least 8 hours.    Radiology PERIPHERAL VASCULAR CATHETERIZATION  Result Date: 04/16/2021 See surgical note for result.   Assessment/Plan  ESRD on hemodialysis ([place]) -  M, W, F via LUE AV fistula The patient does not currently have adequate dialysis access for his end-stage renal disease of a permanent nature.  His PermCath is working fine and usable now, but this is not a great long-term option.  We discussed moving to the right arm versus a HeRO graft on the left arm.  He would prefer the left arm which is reasonable. This will be scheduled for the near future at his convenience.   Diabetes mellitus with ESRD (end-stage renal disease) ([place]) blood glucose control important in reducing the progression of atherosclerotic disease. Also, involved in wound healing. On appropriate medications.   Hypertension Likely an underlying cause of his renal failure and blood pressure control important in reducing the progression of atherosclerotic disease. On appropriate oral medications.    [name], MD  04/30/2021 2:11 PM    This note was created with Dragon medical transcription system.  Any errors from dictation are purely unintentional

## 2021-04-30 NOTE — Assessment & Plan Note (Signed)
The patient does not currently have adequate dialysis access for his end-stage renal disease of a permanent nature.  His PermCath is working fine and usable now, but this is not a great long-term option.  We discussed moving to the right arm versus a HeRO graft on the left arm.  He would prefer the left arm which is reasonable. This will be scheduled for the near future at his convenience.

## 2021-06-24 ENCOUNTER — Telehealth (INDEPENDENT_AMBULATORY_CARE_PROVIDER_SITE_OTHER): Payer: Self-pay

## 2021-06-24 NOTE — Telephone Encounter (Signed)
I attempted to contact the patient as he left a message wanting to know about surgery on 06/25/21. Patient does not have any surgery scheduled. I left a message to inform the patient that he will be scheduled for a left arm HERO graft surgery after prior authorization has been met.

## 2021-06-26 NOTE — Telephone Encounter (Signed)
Patient is scheduled with Dr. Lucky Cowboy for a left arm HERO graft  on 07/11/21. Pre-surgical phone call will be on 07/02/21 between 8-1 pm. Pre-surgical instructions will be mailed. ?

## 2021-07-02 ENCOUNTER — Other Ambulatory Visit
Admission: RE | Admit: 2021-07-02 | Discharge: 2021-07-02 | Disposition: A | Discharge status: Home / Self Care | Payer: Medicare Other | Source: Ambulatory Visit | Attending: Vascular Surgery | Admitting: Vascular Surgery

## 2021-07-02 ENCOUNTER — Other Ambulatory Visit (INDEPENDENT_AMBULATORY_CARE_PROVIDER_SITE_OTHER): Payer: Self-pay | Admitting: Nurse Practitioner

## 2021-07-02 ENCOUNTER — Other Ambulatory Visit: Payer: Self-pay

## 2021-07-02 DIAGNOSIS — Z992 Dependence on renal dialysis: Secondary | ICD-10-CM

## 2021-07-02 DIAGNOSIS — N186 End stage renal disease: Secondary | ICD-10-CM

## 2021-07-02 NOTE — Patient Instructions (Addendum)
?Your procedure is scheduled on: Thursday July 11, 2021. ?Report to Day Surgery inside [facility] 2nd floor, stop by admissions desk before getting on elevator.  ?To find out your arrival time please call [phone] between 1PM - 3PM on Wednesday July 10, 2021. ? ?Remember: Instructions that are not followed completely may result in serious medical risk,  ?up to and including death, or upon the discretion of your surgeon and anesthesiologist your  ?surgery may need to be rescheduled.  ? ?  _X__ 1. Do not eat food or drink fluids after midnight the night before your procedure. ?                No chewing gum or hard candies. ? ?__X__2.  On the morning of surgery brush your teeth with toothpaste and water, you ?               may rinse your mouth with mouthwash if you wish.  Do not swallow any toothpaste or mouthwash. ?   ? _X__ 3.  No Alcohol for 24 hours before or after surgery. ? ? _X__ 4.  Do Not Smoke or use e-cigarettes For 24 Hours Prior to Your Surgery. ?                Do not use any chewable tobacco products for at least 6 hours prior to ?                Surgery. ? ?_X__  5.  Do not use any recreational drugs (marijuana, cocaine, heroin, ecstasy, MDMA or other) ?               For at least one week prior to your surgery.  Combination of these drugs with anesthesia ?               May have life threatening results. ? ?____  6.  Bring all medications with you on the day of surgery if instructed.  ? ?__X__  7.  Notify your doctor if there is any change in your medical condition  ?    (cold, fever, infections). ?    ?Do not wear jewelry, make-up, hairpins, clips or nail polish. ?Do not wear lotions, powders, or perfumes. You may wear deodorant. ?Do not shave 48 hours prior to surgery. Men may shave face and neck. ?Do not bring valuables to the hospital.   ? ?[facility] is not responsible for any belongings or valuables. ? ?Contacts, dentures or bridgework may not be worn into  surgery. ?Leave your suitcase in the car. After surgery it may be brought to your room. ?For patients admitted to the hospital, discharge time is determined by your ?treatment team. ?  ?Patients discharged the day of surgery will not be allowed to drive home.   ?Make arrangements for someone to be with you for the first 24 hours of your ?Same Day Discharge. ? ? ?__X__ Take these medicines the morning of surgery with A SIP OF WATER:  ? ? 1. carvedilol (COREG) 25 MG ? 2. omeprazole (PRILOSEC) 40 MG  ? 3.  ? 4. ? 5. ? 6. ? ?____ Fleet Enema (as directed)  ? ?__X__ Use CHG Soap (or wipes) as directed ? ?____ Use Benzoyl Peroxide Gel as instructed ? ?____ Use inhalers on the day of surgery ? ?____ Stop metformin 2 days prior to surgery   ? ?__X__ Take 1/2 of usual insulin dose the night before surgery. No insulin the morning ?  of surgery. HUMULIN 70/30 (70-30) 100 UNIT/ML injection ? ?____ Call your PCP, cardiologist, or Pulmonologist if taking Coumadin/Plavix/aspirin and ask when to stop before your surgery.  ? ?__X__ One Week prior to surgery- Stop Anti-inflammatories such as Ibuprofen, Aleve, Advil, Motrin, meloxicam (MOBIC), diclofenac, etodolac, ketorolac, Toradol, Daypro, piroxicam, Goody's or BC powders. OK TO USE TYLENOL IF NEEDED ?  ?__X__ Stop supplements until after surgery.   ? ?____ Bring C-Pap to the hospital.  ? ? ?If you have any questions regarding your pre-procedure instructions,  ?Please call Pre-admit Testing at [phone] ?

## 2021-07-04 ENCOUNTER — Encounter
Admission: RE | Admit: 2021-07-04 | Discharge: 2021-07-04 | Disposition: A | Discharge status: Home / Self Care | Payer: Medicare Other | Source: Ambulatory Visit | Attending: Internal Medicine | Admitting: Internal Medicine

## 2021-07-04 ENCOUNTER — Other Ambulatory Visit: Payer: Self-pay

## 2021-07-04 DIAGNOSIS — Z01812 Encounter for preprocedural laboratory examination: Secondary | ICD-10-CM | POA: Diagnosis not present

## 2021-07-04 DIAGNOSIS — N186 End stage renal disease: Secondary | ICD-10-CM | POA: Insufficient documentation

## 2021-07-04 DIAGNOSIS — Z992 Dependence on renal dialysis: Secondary | ICD-10-CM | POA: Insufficient documentation

## 2021-07-04 LAB — CBC WITH DIFFERENTIAL/PLATELET
Abs Immature Granulocytes: 0.01 10*3/uL (ref 0.00–0.07)
Basophils Absolute: 0.1 10*3/uL (ref 0.0–0.1)
Basophils Relative: 2 %
Eosinophils Absolute: 0.4 10*3/uL (ref 0.0–0.5)
Eosinophils Relative: 7 %
HCT: 46.4 % (ref 39.0–52.0)
Hemoglobin: 13.8 g/dL (ref 13.0–17.0)
Immature Granulocytes: 0 %
Lymphocytes Relative: 18 %
Lymphs Abs: 1.1 10*3/uL (ref 0.7–4.0)
MCH: 27.4 pg (ref 26.0–34.0)
MCHC: 29.7 g/dL — ABNORMAL LOW (ref 30.0–36.0)
MCV: 92.1 fL (ref 80.0–100.0)
Monocytes Absolute: 0.5 10*3/uL (ref 0.1–1.0)
Monocytes Relative: 9 %
Neutro Abs: 4 10*3/uL (ref 1.7–7.7)
Neutrophils Relative %: 64 %
Platelets: 199 10*3/uL (ref 150–400)
RBC: 5.04 MIL/uL (ref 4.22–5.81)
RDW: 16.1 % — ABNORMAL HIGH (ref 11.5–15.5)
WBC: 6.1 10*3/uL (ref 4.0–10.5)
nRBC: 0 % (ref 0.0–0.2)

## 2021-07-04 LAB — BASIC METABOLIC PANEL
Anion gap: 17 — ABNORMAL HIGH (ref 5–15)
BUN: 51 mg/dL — ABNORMAL HIGH (ref 6–20)
CO2: 24 mmol/L (ref 22–32)
Calcium: 7.8 mg/dL — ABNORMAL LOW (ref 8.9–10.3)
Chloride: 95 mmol/L — ABNORMAL LOW (ref 98–111)
Creatinine, Ser: 13.66 mg/dL — ABNORMAL HIGH (ref 0.61–1.24)
GFR, Estimated: 4 mL/min — ABNORMAL LOW (ref >60)
Glucose, Bld: 290 mg/dL — ABNORMAL HIGH (ref 70–99)
Potassium: 4.8 mmol/L (ref 3.5–5.1)
Sodium: 136 mmol/L (ref 135–145)

## 2021-07-10 MED ORDER — VANCOMYCIN HCL 1500 MG/300ML IV SOLN
1500.0000 mg | INTRAVENOUS | Status: AC
  Administered 2021-07-11: 1500 mg via INTRAVENOUS
  Filled 2021-07-10: qty 300

## 2021-07-10 MED ORDER — ORAL CARE MOUTH RINSE: 15.0000 mL | Freq: Once | OROMUCOSAL | Status: DC

## 2021-07-10 MED ORDER — CHLORHEXIDINE GLUCONATE CLOTH 2 % EX PADS: 6.0000 | MEDICATED_PAD | Freq: Once | CUTANEOUS | Status: DC

## 2021-07-10 MED ORDER — CHLORHEXIDINE GLUCONATE 0.12 % MT SOLN: 15.0000 mL | Freq: Once | OROMUCOSAL | Status: DC

## 2021-07-10 MED ORDER — SODIUM CHLORIDE 0.9 % IV SOLN: INTRAVENOUS | Status: DC

## 2021-07-11 ENCOUNTER — Ambulatory Visit: Payer: Medicare Other | Admitting: Urgent Care

## 2021-07-11 ENCOUNTER — Encounter
Admission: RE | Disposition: A | Discharge status: Home / Self Care | Payer: Self-pay | Source: Home / Self Care | Attending: Vascular Surgery

## 2021-07-11 ENCOUNTER — Other Ambulatory Visit: Payer: Self-pay

## 2021-07-11 ENCOUNTER — Ambulatory Visit
Admission: RE | Admit: 2021-07-11 | Discharge: 2021-07-11 | Disposition: A | Discharge status: Home / Self Care | Payer: Medicare Other | Attending: Vascular Surgery | Admitting: Vascular Surgery

## 2021-07-11 ENCOUNTER — Ambulatory Visit: Payer: Medicare Other

## 2021-07-11 DIAGNOSIS — Z79899 Other long term (current) drug therapy: Secondary | ICD-10-CM | POA: Diagnosis not present

## 2021-07-11 DIAGNOSIS — I132 Hypertensive heart and chronic kidney disease with heart failure and with stage 5 chronic kidney disease, or end stage renal disease: Secondary | ICD-10-CM | POA: Insufficient documentation

## 2021-07-11 DIAGNOSIS — Z794 Long term (current) use of insulin: Secondary | ICD-10-CM | POA: Diagnosis not present

## 2021-07-11 DIAGNOSIS — I2510 Atherosclerotic heart disease of native coronary artery without angina pectoris: Secondary | ICD-10-CM | POA: Diagnosis not present

## 2021-07-11 DIAGNOSIS — K219 Gastro-esophageal reflux disease without esophagitis: Secondary | ICD-10-CM | POA: Diagnosis not present

## 2021-07-11 DIAGNOSIS — Z992 Dependence on renal dialysis: Secondary | ICD-10-CM | POA: Diagnosis not present

## 2021-07-11 DIAGNOSIS — T82590A Other mechanical complication of surgically created arteriovenous fistula, initial encounter: Secondary | ICD-10-CM

## 2021-07-11 DIAGNOSIS — N2581 Secondary hyperparathyroidism of renal origin: Secondary | ICD-10-CM | POA: Insufficient documentation

## 2021-07-11 DIAGNOSIS — E1122 Type 2 diabetes mellitus with diabetic chronic kidney disease: Secondary | ICD-10-CM | POA: Diagnosis present

## 2021-07-11 DIAGNOSIS — I509 Heart failure, unspecified: Secondary | ICD-10-CM | POA: Diagnosis not present

## 2021-07-11 DIAGNOSIS — Z833 Family history of diabetes mellitus: Secondary | ICD-10-CM | POA: Insufficient documentation

## 2021-07-11 DIAGNOSIS — I10 Essential (primary) hypertension: Secondary | ICD-10-CM

## 2021-07-11 DIAGNOSIS — N186 End stage renal disease: Secondary | ICD-10-CM | POA: Diagnosis not present

## 2021-07-11 HISTORY — PX: VASCULAR ACCESS DEVICE INSERTION: SHX5158

## 2021-07-11 LAB — TYPE AND SCREEN
ABO/RH(D): O POS
Antibody Screen: NEGATIVE

## 2021-07-11 LAB — POCT I-STAT, CHEM 8
BUN: 46 mg/dL — ABNORMAL HIGH (ref 6–20)
Calcium, Ion: 0.87 mmol/L — CL (ref 1.15–1.40)
Chloride: 102 mmol/L (ref 98–111)
Creatinine, Ser: 16.4 mg/dL — ABNORMAL HIGH (ref 0.61–1.24)
Glucose, Bld: 272 mg/dL — ABNORMAL HIGH (ref 70–99)
HCT: 47 % (ref 39.0–52.0)
Hemoglobin: 16 g/dL (ref 13.0–17.0)
Potassium: 5.4 mmol/L — ABNORMAL HIGH (ref 3.5–5.1)
Sodium: 135 mmol/L (ref 135–145)
TCO2: 24 mmol/L (ref 22–32)

## 2021-07-11 LAB — GLUCOSE, CAPILLARY
Glucose-Capillary: 285 mg/dL — ABNORMAL HIGH (ref 70–99)
Glucose-Capillary: 217 mg/dL — ABNORMAL HIGH (ref 70–99)

## 2021-07-11 SURGERY — INSERTION, CATHETER, HERO
Anesthesia: General | Site: Arm Upper | Laterality: Left

## 2021-07-11 MED ORDER — GLYCOPYRROLATE 0.2 MG/ML IJ SOLN
INTRAMUSCULAR | Status: DC | PRN
  Administered 2021-07-11: .2 mg via INTRAVENOUS

## 2021-07-11 MED ORDER — FENTANYL CITRATE (PF) 100 MCG/2ML IJ SOLN
25.0000 ug | INTRAMUSCULAR | Status: DC | PRN
  Administered 2021-07-11: 50 ug via INTRAVENOUS

## 2021-07-11 MED ORDER — OXYCODONE HCL 5 MG PO TABS: 5.0000 mg | ORAL_TABLET | Freq: Once | ORAL | Status: AC | PRN

## 2021-07-11 MED ORDER — HYDROMORPHONE HCL 1 MG/ML IJ SOLN: 1.0000 mg | Freq: Once | INTRAMUSCULAR | Status: DC | PRN

## 2021-07-11 MED ORDER — PHENYLEPHRINE HCL-NACL 20-0.9 MG/250ML-% IV SOLN
INTRAVENOUS | Status: DC | PRN
  Administered 2021-07-11: 15 ug/min via INTRAVENOUS
  Administered 2021-07-11: 25 ug/min via INTRAVENOUS

## 2021-07-11 MED ORDER — CHLORHEXIDINE GLUCONATE 0.12 % MT SOLN
OROMUCOSAL | Status: AC
  Filled 2021-07-11: qty 15

## 2021-07-11 MED ORDER — OXYCODONE HCL 5 MG PO TABS
ORAL_TABLET | ORAL | Status: AC
  Administered 2021-07-11: 5 mg via ORAL
  Filled 2021-07-11: qty 1

## 2021-07-11 MED ORDER — MIDAZOLAM HCL 2 MG/2ML IJ SOLN
INTRAMUSCULAR | Status: DC | PRN
  Administered 2021-07-11: 2 mg via INTRAVENOUS

## 2021-07-11 MED ORDER — ROCURONIUM BROMIDE 100 MG/10ML IV SOLN
INTRAVENOUS | Status: DC | PRN
  Administered 2021-07-11: 50 mg via INTRAVENOUS
  Administered 2021-07-11: 10 mg via INTRAVENOUS

## 2021-07-11 MED ORDER — FENTANYL CITRATE (PF) 100 MCG/2ML IJ SOLN
INTRAMUSCULAR | Status: AC
  Administered 2021-07-11: 50 ug via INTRAVENOUS
  Filled 2021-07-11: qty 2

## 2021-07-11 MED ORDER — OXYCODONE HCL 5 MG PO TABS: 5.0000 mg | ORAL_TABLET | Freq: Once | ORAL | Status: DC | PRN

## 2021-07-11 MED ORDER — PROPOFOL 10 MG/ML IV BOLUS
INTRAVENOUS | Status: DC | PRN
  Administered 2021-07-11: 140 mg via INTRAVENOUS

## 2021-07-11 MED ORDER — INSULIN ASPART 100 UNIT/ML IJ SOLN
7.0000 [IU] | Freq: Once | INTRAMUSCULAR | Status: AC
  Administered 2021-07-11: 7 [IU] via SUBCUTANEOUS

## 2021-07-11 MED ORDER — MIDAZOLAM HCL 2 MG/2ML IJ SOLN
INTRAMUSCULAR | Status: AC
  Filled 2021-07-11: qty 2

## 2021-07-11 MED ORDER — PHENYLEPHRINE 40 MCG/ML (10ML) SYRINGE FOR IV PUSH (FOR BLOOD PRESSURE SUPPORT)
PREFILLED_SYRINGE | INTRAVENOUS | Status: DC | PRN
  Administered 2021-07-11 (×2): 160 ug via INTRAVENOUS
  Administered 2021-07-11 (×2): 80 ug via INTRAVENOUS

## 2021-07-11 MED ORDER — ONDANSETRON HCL 4 MG/2ML IJ SOLN: 4.0000 mg | Freq: Four times a day (QID) | INTRAMUSCULAR | Status: DC | PRN

## 2021-07-11 MED ORDER — SUGAMMADEX SODIUM 200 MG/2ML IV SOLN
INTRAVENOUS | Status: DC | PRN
  Administered 2021-07-11: 200 mg via INTRAVENOUS

## 2021-07-11 MED ORDER — HEMOSTATIC AGENTS (NO CHARGE) OPTIME
TOPICAL | Status: DC | PRN
  Administered 2021-07-11: 1 via TOPICAL

## 2021-07-11 MED ORDER — FENTANYL CITRATE (PF) 100 MCG/2ML IJ SOLN
INTRAMUSCULAR | Status: AC
  Filled 2021-07-11: qty 2

## 2021-07-11 MED ORDER — EPHEDRINE SULFATE (PRESSORS) 50 MG/ML IJ SOLN
INTRAMUSCULAR | Status: DC | PRN
  Administered 2021-07-11 (×4): 5 mg via INTRAVENOUS

## 2021-07-11 MED ORDER — HEPARIN SODIUM (PORCINE) 5000 UNIT/ML IJ SOLN
INTRAMUSCULAR | Status: AC
Start: 2021-07-11 — End: ?
  Filled 2021-07-11: qty 1

## 2021-07-11 MED ORDER — INSULIN ASPART 100 UNIT/ML IJ SOLN
INTRAMUSCULAR | Status: AC
  Filled 2021-07-11: qty 1

## 2021-07-11 MED ORDER — HYDROCODONE-ACETAMINOPHEN 5-325 MG PO TABS: 2.0000 | ORAL_TABLET | Freq: Four times a day (QID) | ORAL | 0 refills | Status: DC | PRN

## 2021-07-11 MED ORDER — OXYCODONE HCL 5 MG/5ML PO SOLN: 5.0000 mg | Freq: Once | ORAL | Status: AC | PRN

## 2021-07-11 MED ORDER — ACETAMINOPHEN 10 MG/ML IV SOLN
1000.0000 mg | Freq: Once | INTRAVENOUS | Status: DC | PRN
  Administered 2021-07-11: 1000 mg via INTRAVENOUS

## 2021-07-11 MED ORDER — HEPARIN SODIUM (PORCINE) 1000 UNIT/ML IJ SOLN
INTRAMUSCULAR | Status: DC | PRN
  Administered 2021-07-11: 4000 [IU] via INTRAVENOUS

## 2021-07-11 MED ORDER — 0.9 % SODIUM CHLORIDE (POUR BTL) OPTIME
TOPICAL | Status: DC | PRN
  Administered 2021-07-11: 100 mL

## 2021-07-11 MED ORDER — ACETAMINOPHEN 10 MG/ML IV SOLN
INTRAVENOUS | Status: AC
  Filled 2021-07-11: qty 100

## 2021-07-11 MED ORDER — LIDOCAINE HCL (CARDIAC) PF 100 MG/5ML IV SOSY
PREFILLED_SYRINGE | INTRAVENOUS | Status: DC | PRN
  Administered 2021-07-11: 60 mg via INTRAVENOUS

## 2021-07-11 MED ORDER — FIBRIN SEALANT 2 ML SINGLE DOSE KIT
PACK | CUTANEOUS | Status: DC | PRN
Start: 2021-07-11 — End: 2021-07-11
  Administered 2021-07-11: 2 mL via TOPICAL

## 2021-07-11 MED ORDER — HEPARIN 5000 UNITS IN NS 1000 ML (FLUSH)
INTRAMUSCULAR | Status: DC | PRN
  Administered 2021-07-11: 200 mL via INTRAMUSCULAR

## 2021-07-11 MED ORDER — FENTANYL CITRATE (PF) 100 MCG/2ML IJ SOLN
INTRAMUSCULAR | Status: DC | PRN
  Administered 2021-07-11 (×2): 50 ug via INTRAVENOUS

## 2021-07-11 MED ORDER — ONDANSETRON HCL 4 MG/2ML IJ SOLN
INTRAMUSCULAR | Status: DC | PRN
  Administered 2021-07-11: 4 mg via INTRAVENOUS

## 2021-07-11 SURGICAL SUPPLY — 71 items
ADH SKN CLS APL DERMABOND .7 (GAUZE/BANDAGES/DRESSINGS) ×3
APL PRP STRL LF DISP 70% ISPRP (MISCELLANEOUS) ×2
BAG DECANTER FOR FLEXI CONT (MISCELLANEOUS) ×2 IMPLANT
BLADE SURG 15 STRL LF DISP TIS (BLADE) ×1 IMPLANT
BLADE SURG 15 STRL SS (BLADE) ×2
BLADE SURG SZ11 CARB STEEL (BLADE) ×2 IMPLANT
BOOT SUTURE AID YELLOW STND (SUTURE) ×2 IMPLANT
BRUSH SCRUB EZ  4% CHG (MISCELLANEOUS) ×1
BRUSH SCRUB EZ 4% CHG (MISCELLANEOUS) ×1 IMPLANT
CATH BEACON 5 .035 40 KMP TP (CATHETERS) IMPLANT
CATH BEACON 5 .038 40 KMP TP (CATHETERS) ×1
CHLORAPREP W/TINT 26 (MISCELLANEOUS) ×4 IMPLANT
CLIP SPRNG 6 S-JAW DBL (CLIP) ×1 IMPLANT
CLIP SPRNG 6MM S-JAW DBL (CLIP) ×2
COMPONENT HERO ACCESSORY KIT (VASCULAR PRODUCTS) ×2 IMPLANT
COMPONENT HERO ARTERIAL GRAFT (Vascular Products) ×2 IMPLANT
COMPONENT HERO VENOUS OVERFLOW (Vascular Products) ×2 IMPLANT
COVER PROBE FLX POLY STRL (MISCELLANEOUS) ×2 IMPLANT
DERMABOND ADVANCED (GAUZE/BANDAGES/DRESSINGS) ×3
DERMABOND ADVANCED .7 DNX12 (GAUZE/BANDAGES/DRESSINGS) ×1 IMPLANT
DRAPE 3/4 80X56 (DRAPES) ×2 IMPLANT
DRAPE C-ARM XRAY 36X54 (DRAPES) ×2 IMPLANT
DRAPE INCISE IOBAN 66X45 STRL (DRAPES) ×2 IMPLANT
ELECT CAUTERY BLADE 6.4 (BLADE) ×2 IMPLANT
ELECT REM PT RETURN 9FT ADLT (ELECTROSURGICAL) ×2
ELECTRODE REM PT RTRN 9FT ADLT (ELECTROSURGICAL) ×1 IMPLANT
GLOVE SURG SYN 7.0 (GLOVE) ×2 IMPLANT
GLOVE SURG SYN 7.0 PF PI (GLOVE) ×1 IMPLANT
GOWN STRL REUS W/ TWL LRG LVL3 (GOWN DISPOSABLE) ×1 IMPLANT
GOWN STRL REUS W/ TWL XL LVL3 (GOWN DISPOSABLE) ×1 IMPLANT
GOWN STRL REUS W/TWL LRG LVL3 (GOWN DISPOSABLE) ×2
GOWN STRL REUS W/TWL XL LVL3 (GOWN DISPOSABLE) ×2
GUIDEWIRE SUPER STIFF .035X180 (WIRE) ×1 IMPLANT
HEMOSTAT SURGICEL 2X3 (HEMOSTASIS) ×2 IMPLANT
IV NS 500ML (IV SOLUTION) ×2
IV NS 500ML BAXH (IV SOLUTION) ×1 IMPLANT
KIT TURNOVER KIT A (KITS) ×2 IMPLANT
LABEL OR SOLS (LABEL) ×2 IMPLANT
LOOP RED MAXI  1X406MM (MISCELLANEOUS) ×2
LOOP VESSEL MAXI 1X406 RED (MISCELLANEOUS) ×2 IMPLANT
LOOP VESSEL MINI 0.8X406 BLUE (MISCELLANEOUS) ×1 IMPLANT
LOOPS BLUE MINI 0.8X406MM (MISCELLANEOUS) ×2
MANIFOLD NEPTUNE II (INSTRUMENTS) ×2 IMPLANT
NDL FILTER BLUNT 18X1 1/2 (NEEDLE) ×1 IMPLANT
NEEDLE FILTER BLUNT 18X 1/2SAF (NEEDLE) ×1
NEEDLE FILTER BLUNT 18X1 1/2 (NEEDLE) ×1 IMPLANT
PACK ANGIOGRAPHY (CUSTOM PROCEDURE TRAY) ×1 IMPLANT
PACK BASIN MAJOR ARMC (MISCELLANEOUS) ×2 IMPLANT
PACK UNIVERSAL (MISCELLANEOUS) ×2 IMPLANT
PAD PREP 24X41 OB/GYN DISP (PERSONAL CARE ITEMS) ×2 IMPLANT
SHEATH BRITE TIP 8FRX11 (SHEATH) ×1 IMPLANT
SOLUTION CELL SAVER (CLIP) ×1 IMPLANT
SPONGE T-LAP 18X18 ~~LOC~~+RFID (SPONGE) ×4 IMPLANT
STOCKINETTE 48X4 2 PLY STRL (GAUZE/BANDAGES/DRESSINGS) ×1 IMPLANT
STOCKINETTE STRL 4IN 9604848 (GAUZE/BANDAGES/DRESSINGS) ×2 IMPLANT
SUT GTX CV-6 30 (SUTURE) ×4 IMPLANT
SUT MNCRL AB 4-0 PS2 18 (SUTURE) ×2 IMPLANT
SUT PROLENE 6 0 BV (SUTURE) ×4 IMPLANT
SUT SILK 2 0 (SUTURE) ×2
SUT SILK 2 0 SH (SUTURE) ×2 IMPLANT
SUT SILK 2-0 18XBRD TIE 12 (SUTURE) ×1 IMPLANT
SUT SILK 3 0 (SUTURE) ×2
SUT SILK 3-0 18XBRD TIE 12 (SUTURE) ×1 IMPLANT
SUT SILK 4 0 (SUTURE) ×2
SUT SILK 4-0 18XBRD TIE 12 (SUTURE) ×1 IMPLANT
SUT VIC AB 3-0 SH 27 (SUTURE) ×4
SUT VIC AB 3-0 SH 27X BRD (SUTURE) ×2 IMPLANT
SYR 20ML LL LF (SYRINGE) ×2 IMPLANT
SYR 3ML LL SCALE MARK (SYRINGE) ×2 IMPLANT
WATER STERILE IRR 500ML POUR (IV SOLUTION) ×2 IMPLANT
WIRE GUIDERIGHT .035X150 (WIRE) ×1 IMPLANT

## 2021-07-11 NOTE — H&P (Signed)
?[facility] ?Admission History & Physical ? ?MRN : [id number] ? ?[name] [name] is a 46 y.o. ([birth year]) male who presents with chief complaint of No chief complaint on file. ?. ? ?History of Present Illness: Patient presents today for placement of his left arm hero graft.  Has had multiple failed accesses in the left arm.  Currently has a right jugular PermCath which is working fine.  No fevers or chills.  No new complaints today. ? ?Current Outpatient Medications  ?Medication Sig Dispense Refill  ? aspirin EC 81 MG tablet Take 81 mg by mouth daily at 6 PM. (1700)      ? Beta Carotene (VITAMIN A) 25000 UNIT capsule Take 25,000 Units by mouth daily.      ? carvedilol (COREG) 25 MG tablet Take 25 mg by mouth 2 (two) times daily.      ? cinacalcet (SENSIPAR) 60 MG tablet Take 1 tablet by mouth daily.      ? cyclobenzaprine (FLEXERIL) 10 MG tablet Take 5 mg by mouth 2 (two) times daily as needed.      ? HUMULIN 70/30 (70-30) 100 UNIT/ML injection Inject 10-30 Units into the skin See admin instructions. 10 units before breakfast and 30 units before supper ?ON DIALYSIS DAYS (M-W-F), NO AM INSULIN      ? loperamide (IMODIUM) 2 MG capsule Take 2 mg by mouth as needed for diarrhea or loose stools (Take 1/2-1 tablet as meeded to prevent diarrhea. Can take up to 1 tablet every 4 hours. Do not exceed 4 tablets in a day.).      ? losartan (COZAAR) 50 MG tablet Take 50 mg by mouth daily. (1700)      ? omeprazole (PRILOSEC) 40 MG capsule Take 40 mg by mouth daily.      ? sevelamer carbonate (RENVELA) 2.4 g PACK Take 2.4 g by mouth 3 (three) times daily with meals.      ? vitamin E 1000 UNIT capsule Take 1,000 Units by mouth daily. Not sure of dose; started 4/29      ?  ?No current facility-administered medications for this visit.  ?  ?  ?    ?Past Medical History:  ?Diagnosis Date  ? 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[place]) 09/23/2020  ? CHF (congestive heart failure) ([place])    ? Coronary artery disease    ? Diabetes  mellitus without complication ([place])    ? Dialysis patient [facility])    ?  M-W-F  ? GERD (gastroesophageal reflux disease)    ? Hypertension    ? Kyrle's disease    ? Renal disorder    ?  on dialysis for 4 years three times a week last was Tuesday  ? Secondary hyperparathyroidism of renal origin [facility])    ? Shortness of breath dyspnea    ? Type 2 diabetes mellitus with diabetic nephropathy ([place])    ? Type 2 diabetes mellitus with diabetic retinopathy ([place])    ?  ?  ?     ?Past Surgical History:  ?Procedure Laterality Date  ? A/V FISTULAGRAM Left 10/01/2017  ?  Procedure: A/V FISTULAGRAM;  Surgeon: Algernon Huxley, MD;  Location: [place] [facility];  Service: Cardiovascular;  Laterality: Left;  ? A/V FISTULAGRAM Left 05/17/2020  ?  Procedure: A/V FISTULAGRAM;  Surgeon: Algernon Huxley, MD;  Location: [place] [facility];  Service: Cardiovascular;  Laterality: Left;  ? A/V SHUNTOGRAM Left 04/16/2021  ?  Procedure: A/V SHUNTOGRAM;  Surgeon: Hortencia Pilar  G, MD;  Location: [place] [facility];  Service: Cardiovascular;  Laterality: Left;  ? CATARACT EXTRACTION W/PHACO Right 06/30/2018  ?  Procedure: CATARACT EXTRACTION PHACO AND INTRAOCULAR LENS PLACEMENT (IOC)-RIGHT;  Surgeon: Eulogio Bear, MD;  Location: [facility] ORS;  Service: Ophthalmology;  Laterality: Right;  [place] 00:07.0 ?CDE 0.16 ?FLUID PACK lOT # [id number]  ? CATARACT EXTRACTION W/PHACO Left 12/24/2018  ?  Procedure: CATARACT EXTRACTION PHACO AND INTRAOCULAR LENS PLACEMENT (IOC);  Surgeon: Eulogio Bear, MD;  Location: [facility] ORS;  Service: Ophthalmology;  Laterality: Left;  [place] 00:30.0 ?CDE 2.01 ?FLUID PACK LOT # [id number]  ? COLONOSCOPY WITH PROPOFOL N/A 04/23/2017  ?  Procedure: COLONOSCOPY WITH PROPOFOL;  Surgeon: [name], MD;  Location: [facility] ENDOSCOPY;  Service: Endoscopy;  Laterality: N/A;  ? COLONOSCOPY WITH PROPOFOL N/A 07/02/2017  ?  Procedure: COLONOSCOPY WITH PROPOFOL;  Surgeon: [name], MD;  Location: [facility] ENDOSCOPY;  Service: Endoscopy;   Laterality: N/A;  ? DIALYSIS/PERMA CATHETER INSERTION N/A 09/27/2020  ?  Procedure: DIALYSIS/PERMA CATHETER INSERTION;  Surgeon: Algernon Huxley, MD;  Location: [place] [facility];  Service: Cardiovascular;  Laterality: N/A;  ? DIALYSIS/PERMA CATHETER REMOVAL N/A 11/01/2020  ?  Procedure: DIALYSIS/PERMA CATHETER REMOVAL;  Surgeon: Algernon Huxley, MD;  Location: [place] [facility];  Service: Cardiovascular;  Laterality: N/A;  ? ESOPHAGOGASTRODUODENOSCOPY (EGD) WITH PROPOFOL N/A 04/23/2017  ?  Procedure: ESOPHAGOGASTRODUODENOSCOPY (EGD) WITH PROPOFOL;  Surgeon: [name], MD;  Location: [facility] ENDOSCOPY;  Service: Endoscopy;  Laterality: N/A;  ? ESOPHAGOGASTRODUODENOSCOPY (EGD) WITH PROPOFOL N/A 03/27/2020  ?  Procedure: ESOPHAGOGASTRODUODENOSCOPY (EGD) WITH PROPOFOL;  Surgeon: Lesly Rubenstein, MD;  Location: [facility] ENDOSCOPY;  Service: Endoscopy;  Laterality: N/A;  ? EYE SURGERY      ?  RETINA  ? INSERTION OF DIALYSIS CATHETER Right 08/09/2020  ?  Procedure: INSERTION OF DIALYSIS CATHETER;  Surgeon: Algernon Huxley, MD;  Location: [facility] ORS;  Service: Vascular;  Laterality: Right;  ? PERIPHERAL VASCULAR CATHETERIZATION N/A 05/07/2015  ?  Procedure: A/V Shuntogram/Fistulagram;  Surgeon: Katha Cabal, MD;  Location: [place] [facility];  Service: Cardiovascular;  Laterality: N/A;  ? PERIPHERAL VASCULAR CATHETERIZATION Left 05/07/2015  ?  Procedure: A/V Shunt Intervention;  Surgeon: Katha Cabal, MD;  Location: [place] [facility];  Service: Cardiovascular;  Laterality: Left;  ? PERIPHERAL VASCULAR THROMBECTOMY Left 07/16/2020  ?  Procedure: PERIPHERAL VASCULAR THROMBECTOMY;  Surgeon: Algernon Huxley, MD;  Location: [facility];  Service: Cardiovascular;  Laterality: Left;  ? PERIPHERAL VASCULAR THROMBECTOMY Left 08/27/2020  ?  Procedure: PERIPHERAL VASCULAR THROMBECTOMY;  Surgeon: Algernon Huxley, MD;  Location: [place] [facility];  Service: Cardiovascular;  Laterality: Left;  ? REVISON OF ARTERIOVENOUS  FISTULA Left 08/09/2020  ?  Procedure: REVISON OF ARTERIOVENOUS FISTULA;  Surgeon: Algernon Huxley, MD;  Location: [facility] ORS;  Service: Vascular;  Laterality: Left;  ? TEE WITHOUT CARDIOVERSION N/A 07/05/2015  ?  Procedure: TRANSESOPHAGEAL ECHOCARDIOGRAM (TEE);  Surgeon: Dionisio David, MD;  Location: [facility] ORS;  Service: Cardiovascular;  Laterality: N/A;  ? UPPER EXTREMITY ANGIOGRAPHY Left 06/24/2018  ?  Procedure: UPPER EXTREMITY ANGIOGRAPHY;  Surgeon: Algernon Huxley, MD;  Location: [place] [facility];  Service: Cardiovascular;  Laterality: Left;  ? WOUND DEBRIDEMENT N/A 09/25/2020  ?  Procedure: DEBRIDEMENT WOUND;  Surgeon: Katha Cabal, MD;  Location: [facility] ORS;  Service: Vascular;  Laterality: N/A;  ?  ?  ?  ?Social History  ?  ?     ?  Tobacco Use  ? Smoking status: Never  ? Smokeless tobacco: Never  ?Vaping Use  ? Vaping Use: Never used  ?Substance Use Topics  ? Alcohol use: Not Currently  ?    Comment: occ. shot  ? Drug use: No  ?  ?  ?  ?  ?     ?Family History  ?Problem Relation Age of Onset  ? Diabetes Father    ?  ?  ?     ?Allergies  ?Allergen Reactions  ? Cephalexin Nausea Only  ? Ivp Dye [Iodinated Contrast Media] Diarrhea  ? Lisinopril Other (See Comments)  ?    Pt cannot recall reaction, cough  ? Other    ?  ?  ?  ?REVIEW OF SYSTEMS (Negative unless checked) ?  ?Constitutional: [] Weight loss  [] Fever  [] Chills ?Cardiac: [] Chest pain   [] Chest pressure   [x] Palpitations   [] Shortness of breath when laying flat   [] Shortness of breath at rest   [x] Shortness of breath with exertion. ?Vascular:  [] Pain in legs with walking   [] Pain in legs at rest   [] Pain in legs when laying flat   [] Claudication   [] Pain in feet when walking  [] Pain in feet at rest  [] Pain in feet when laying flat   [] History of DVT   [] Phlebitis   [] Swelling in legs   [] Varicose veins   [] Non-healing ulcers ?Pulmonary:   [] Uses home oxygen   [] Productive cough   [] Hemoptysis   [] Wheeze  [] COPD   [] Asthma ?Neurologic:  [] Dizziness   [] Blackouts   [] Seizures   [] History of stroke   [] History of TIA  [] Aphasia   [] Temporary blindness   [] Dysphagia   [] Weakness or numbness in arms   [] Weakness or numbness in legs ?Musculoskeletal:  [x] Arthritis   [

## 2021-07-11 NOTE — Transfer of Care (Signed)
Immediate Anesthesia Transfer of Care Note ? ?Patient: [name] [name] ? ?Procedure(s) Performed: INSERTION OF HERO VASCULAR ACCESS DEVICE (Left: Arm Upper) ? ?Patient Location: PACU ? ?Anesthesia Type:General ? ?Level of Consciousness: drowsy and patient cooperative ? ?Airway & Oxygen Therapy: Patient Spontanous Breathing ? ?Post-op Assessment: Report given to RN and Post -op Vital signs reviewed and stable ? ?Post vital signs: Reviewed and stable ? ?Last Vitals:  ?Vitals Value Taken Time  ?BP 125/75 07/11/21 1500  ?Temp    ?Pulse 91 07/11/21 1500  ?Resp 15 07/11/21 1500  ?SpO2 99 % 07/11/21 1500  ?Vitals shown include unvalidated device data. ? ?Last Pain:  ?Vitals:  ? 07/11/21 1100  ?TempSrc: Tympanic  ?PainSc: 0-No pain  ?   ? ?  ? ?Complications: No notable events documented. ?

## 2021-07-11 NOTE — Anesthesia Preprocedure Evaluation (Addendum)
Anesthesia Evaluation  ?Patient identified by MRN, date of birth, ID band ?Patient awake ? ? ? ?Reviewed: ?Allergy & Precautions, NPO status , Patient's Chart, lab work & pertinent test results, reviewed documented beta blocker date and time  ? ?History of Anesthesia Complications ?Negative for: history of anesthetic complications ? ?Airway ?Mallampati: II ? ?TM Distance: >3 FB ?Neck ROM: Full ? ? ? Dental ? ?(+) Poor Dentition, Missing ?  ?Pulmonary ?neg pulmonary ROS, neg sleep apnea, neg COPD,  ?  ?breath sounds clear to auscultation- rhonchi ?(-) wheezing ? ? ? ? ? Cardiovascular ?hypertension, Pt. on medications and Pt. on home beta blockers ?+ CAD and +CHF  ?(-) Past MI, (-) Cardiac Stents and (-) CABG  ?Rhythm:Regular Rate:Normal ?- Systolic murmurs and - Diastolic murmurs ? ?  ?Neuro/Psych ?neg Seizures negative neurological ROS ? negative psych ROS  ? GI/Hepatic ?Neg liver ROS, GERD  Medicated,  ?Endo/Other  ?diabetes, Poorly Controlled, Type 2, Insulin Dependent12/22 A1C 9.7 ? Renal/GU ?ESRF and DialysisRenal disease  ? ?  ?Musculoskeletal ?negative musculoskeletal ROS ?(+)  ? Abdominal ?(+) + obese,   ?Peds ? Hematology ?negative hematology ROS ?(+)   ?Anesthesia Other Findings ?tunneled dialysis catheter was inserted in the right IJ vein. ? ?Past Medical History: ?No date: CHF (congestive heart failure) ([place]) ?No date: Coronary artery disease ?No date: Diabetes mellitus without complication ([place]) ?No date: Dialysis patient [facility]) ?    Comment:  M-W-F ?No date: GERD (gastroesophageal reflux disease) ?No date: Hypertension ?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 ?No date: Renal disorder ?    Comment:  on dialysis for 4 years three times a week last was  ?             Tuesday ?No date: Secondary hyperparathyroidism of renal origin [facility]) ?No date: Shortness of breath dyspnea ?No date: Type 2 diabetes mellitus with diabetic nephropathy ([place]) ?No date: Type 2 diabetes mellitus with diabetic  retinopathy ([place]) ? ? Reproductive/Obstetrics ? ?  ? ? ? ? ? ? ? ? ? ? ? ? ? ?  ?  ? ? ? ? ? ? ? ?Anesthesia Physical ? ?Anesthesia Plan ? ?ASA: 3 ? ?Anesthesia Plan: General  ? ?Post-op Pain Management: Regional block* and Ofirmev IV (intra-op)*  ? ?Induction: Intravenous ? ?PONV Risk Score and Plan: 2 and Ondansetron and Dexamethasone ? ?Airway Management Planned: Oral ETT ? ?Additional Equipment:  ? ?Intra-op Plan:  ? ?Post-operative Plan: Extubation in OR ? ?Informed Consent: I have reviewed the patients History and Physical, chart, labs and discussed the procedure including the risks, benefits and alternatives for the proposed anesthesia with the patient or authorized representative who has indicated his/her understanding and acceptance.  ? ? ? ?Dental advisory given ? ?Plan Discussed with: CRNA and Anesthesiologist ? ?Anesthesia Plan Comments:   ? ? ? ? ? ?Anesthesia Quick Evaluation ? ?

## 2021-07-11 NOTE — Op Note (Signed)
[name] ?  ?OPERATIVE NOTE ?  ?  ?PROCEDURE:  1. Ultrasound guidance for access to the left jugular vein.   ?2.  Fluoroscopic guidance for placement of central venous catheter portion of hero graft and placement of the central venous catheter portion of the hero graft into the right atrium ?3.  Left arm HERO graft placement ?  ?PRE-OPERATIVE DIAGNOSIS: 1. end stage renal disease  ?2. Multiple failed previous dialysis accesses ?  ?POST-OPERATIVE DIAGNOSIS: same ?  ?SURGEON: [name], MD ?  ?ASSISTANT(S): None ?  ?ANESTHESIA: general ?  ?ESTIMATED BLOOD LOSS: 25 cc ?  ?FINDING(S): ?Patent left jugular vein, good left brachial artery ?  ?SPECIMEN(S):  None ?  ?INDICATIONS:   ?Patient is a 46 y.o.male who presents with end stage renal disease and multiple failed previous dialysis access sees. The patient is brought in for a HERO graft on the left arm.  Risk, benefits, and alternatives to access surgery were discussed.  The patient is aware the risks include but are not limited to: bleeding, infection, steal syndrome, nerve damage, ischemic monomelic neuropathy, failure to mature, and need for additional procedures.  The patient is aware of the risks and elects to proceed forward. ?  ?DESCRIPTION: ?After full informed written consent was obtained from the patient, the patient was brought back to the operating room and placed supine upon the operating table.  The patient was given IV antibiotics prior to proceeding.  After obtaining adequate sedation, the patient was prepped and draped in standard fashion for a left arm access procedure. I started by gaining access to the left jugular vein.  This was accessed under direct ultrasound guidance without difficulty with a Seldinger needle and a permanent image was recorded. A wire and then an 8 Fr. Sheath was placed.  The central portion of the HERO graft was then brought onto the field. This was placed over the  wire through the peel-away sheath and parked in the right atrium with fluoroscopic guidance. The peel-away sheath was removed and the central portion of the HERO graft was clamped after it was locally heparinized.   ?I turned my attention next to the antecubitum.  I made an incision over the brachial artery, and dissected down through the subcutaneous tissue to the fascia carefully and was able to dissect out the brachial artery. The brachial artery was dissected out at this location. The artery was patent and of adequate size to support a graft.  It was controlled proximally and distally with vessel loops and then I turned my attention to the shoulder. Incision was created at the shoulder and the previously placed central portion of the HERO graft was tunneled from the jugular incision to the shoulder incision and clamped.  I took a metal tunneler and dissected from the antecubital up to the shoulder incision.  Then I delivered the PTFE portion of the graft, through this metal tunneler and then pulled out the metal tunneler leaving the graft in place and making sure the line was up for orientation.  I then gave the patient 3000 units of heparin to gain some anticoagulation. The PTFE portion of the graft was then connected to the central venous portion of the hero graft. Fluoroscopy was used to confirm that the distal portion of the HERO was parked in the right atrium. After waiting 3 minutes, I placed the brachial artery under tension proximally and distally with vessel loops, made an arteriotomy and extended it with a Potts scissor.  I sewed the graft to this arteriotomy with a running stitch of CV-6 suture.  At this point, then I completed the anastomosis in the usual fashion.  I released the vessel loops on the inflow and allowed the artery to decompress through the graft. There was good pulsatile flow demonstrated immediately through the HERO graft. Fluoroscopy was used to confirm the catheter tip to be in  central venous location without kinking or twisting of any portion of the graft. Surgicel and Vistacel were then placed. There was no more active bleeding.  The subcutaneous tissue was reapproximated with a running stitch of 3-0 Vicryl.  The skin was then reapproximated with a running subcuticular 4-0 Monocryl.  The skin was then cleaned, dried, and Dermabond used to reinforce the skin closure.  We then turned our attention to the shoulder incision.  The subcutaneous tissue was repaired with running stitch of 3-0 Vicryl.  The skin was then reapproximated with running subcuticular 4-0 Monocryl.  The skin was then cleaned, dried, and then the skin closure was reinforced with Dermabond.  The patient was then awakened from anesthesia and taken to the recovery room in stable condition having tolerated the procedure well.   ?  ?COMPLICATIONS: None ?  ?CONDITION: Stable ?  ?  ?[name] ?07/11/2021 ?3:25 PM ?  ?  ?This note was created with Dragon Medical transcription system. Any errors in dictation are purely unintentional.  ?

## 2021-07-11 NOTE — Discharge Instructions (Signed)
AMBULATORY SURGERY  ?DISCHARGE INSTRUCTIONS ? ? ?The drugs that you were given will stay in your system until tomorrow so for the next 24 hours you should not: ? ?Drive an automobile ?Make any legal decisions ?Drink any alcoholic beverage ? ? ?You may resume regular meals tomorrow.  Today it is better to start with liquids and gradually work up to solid foods. ? ?You may eat anything you prefer, but it is better to start with liquids, then soup and crackers, and gradually work up to solid foods. ? ? ?Please notify your doctor immediately if you have any unusual bleeding, trouble breathing, redness and pain at the surgery site, drainage, fever, or pain not relieved by medication. ? ? ? ?Additional Instructions: ? ? ? ?Please contact your physician with any problems or Same Day Surgery at [phone], Monday through Friday 6 am to 4 pm, or [facility] [place] Main number at [phone].  ?

## 2021-07-11 NOTE — Anesthesia Procedure Notes (Signed)
Procedure Name: Intubation ?Date/Time: 07/11/2021 1:06 PM ?Performed by: Kelton Pillar, CRNA ?Pre-anesthesia Checklist: Patient identified, Emergency Drugs available, Suction available and Patient being monitored ?Patient Re-evaluated:Patient Re-evaluated prior to induction ?Oxygen Delivery Method: Circle system utilized ?Preoxygenation: Pre-oxygenation with 100% oxygen ?Induction Type: IV induction ?Ventilation: Mask ventilation without difficulty ?Tube type: Oral ?Number of attempts: 1 ?Airway Equipment and Method: Stylet and Oral airway ?Placement Confirmation: ETT inserted through vocal cords under direct vision, positive ETCO2, breath sounds checked- equal and bilateral and CO2 detector ?Secured at: 21 cm ?Tube secured with: Tape ?Dental Injury: Teeth and Oropharynx as per pre-operative assessment  ? ? ? ? ?

## 2021-07-12 ENCOUNTER — Encounter: Payer: Self-pay | Admitting: Vascular Surgery

## 2021-07-12 NOTE — Anesthesia Postprocedure Evaluation (Signed)
Anesthesia Post Note ? ?Patient: [name] [name] ? ?Procedure(s) Performed: INSERTION OF HERO VASCULAR ACCESS DEVICE (Left: Arm Upper) ? ?Patient location during evaluation: PACU ?Anesthesia Type: General ?Level of consciousness: awake and alert ?Pain management: pain level controlled ?Vital Signs Assessment: post-procedure vital signs reviewed and stable ?Respiratory status: spontaneous breathing, nonlabored ventilation and respiratory function stable ?Cardiovascular status: blood pressure returned to baseline and stable ?Postop Assessment: no apparent nausea or vomiting ?Anesthetic complications: no ? ? ?No notable events documented. ? ? ?Last Vitals:  ?Vitals:  ? 07/11/21 1600 07/11/21 1617  ?BP:  113/75  ?Pulse:  85  ?Resp:  16  ?Temp: (!) 36.2 ?C (!) 36.2 ?C  ?SpO2:  96%  ?  ?Last Pain:  ?Vitals:  ? 07/11/21 1617  ?TempSrc: Temporal  ?PainSc: 4   ? ? ?  ?  ?  ?  ?  ?  ? ?[place] Ouch ? ? ? ? ?

## 2021-07-16 ENCOUNTER — Telehealth (INDEPENDENT_AMBULATORY_CARE_PROVIDER_SITE_OTHER): Payer: Self-pay

## 2021-07-16 NOTE — Telephone Encounter (Signed)
Patient called asking about using Eucerin lotion as his access is itching from dryness. He stated his PCP told him to use it before. I advised that if he was told he could use on previous surgeries on his skin and it helps then do so. Patient asked about getting his pain medication filled so he doesn't run out of pain medication. I explained that it would be filled at the providers discretion, patient asked at the vascular or his PCP's discretion. I explained that it would be the vascular discretion. ?

## 2021-07-23 ENCOUNTER — Ambulatory Visit (INDEPENDENT_AMBULATORY_CARE_PROVIDER_SITE_OTHER): Payer: Medicare Other

## 2021-07-23 ENCOUNTER — Other Ambulatory Visit (INDEPENDENT_AMBULATORY_CARE_PROVIDER_SITE_OTHER): Payer: Self-pay | Admitting: Nurse Practitioner

## 2021-07-23 ENCOUNTER — Ambulatory Visit (INDEPENDENT_AMBULATORY_CARE_PROVIDER_SITE_OTHER): Payer: Medicare Other | Admitting: Nurse Practitioner

## 2021-07-23 ENCOUNTER — Encounter (INDEPENDENT_AMBULATORY_CARE_PROVIDER_SITE_OTHER): Payer: Self-pay | Admitting: Nurse Practitioner

## 2021-07-23 ENCOUNTER — Other Ambulatory Visit: Payer: Self-pay

## 2021-07-23 VITALS — BP 105/66 | HR 83 | Resp 16 | Ht 73.0 in | Wt 284.8 lb

## 2021-07-23 DIAGNOSIS — I10 Essential (primary) hypertension: Secondary | ICD-10-CM

## 2021-07-23 DIAGNOSIS — Z992 Dependence on renal dialysis: Secondary | ICD-10-CM

## 2021-07-23 DIAGNOSIS — N186 End stage renal disease: Secondary | ICD-10-CM

## 2021-07-23 DIAGNOSIS — E1122 Type 2 diabetes mellitus with diabetic chronic kidney disease: Secondary | ICD-10-CM

## 2021-08-04 ENCOUNTER — Encounter (INDEPENDENT_AMBULATORY_CARE_PROVIDER_SITE_OTHER): Payer: Self-pay | Admitting: Nurse Practitioner

## 2021-08-04 NOTE — Progress Notes (Signed)
? ?Subjective:  ? ? Patient ID: [name] [name], male    DOB: 1976/04/24, 46 y.o.   MRN: [id number] ?Chief Complaint  ?Patient presents with  ? Follow-up  ?  ultrasound  ? ? ?[name] [name] is a 46 year old male that presents today for follow-up evaluation of left upper extremity hero graft placement.  The patient still has some swelling in his upper extremity.  He does note that there is some associated tenderness.  Despite this he is doing well overall.  Today he has a flow volume of 1182.  No areas of significant noted however exam was somewhat limited due to upper extremity swelling. ? ? ?Review of Systems  ?Skin:  Positive for wound.  ?All other systems reviewed and are negative. ? ?   ?Objective:  ? Physical Exam ?Vitals reviewed.  ?HENT:  ?   Head: Normocephalic.  ?Cardiovascular:  ?   Rate and Rhythm: Normal rate.  ?   Pulses:     ?     Radial pulses are 1+ on the left side.  ?Pulmonary:  ?   Effort: Pulmonary effort is normal.  ?Skin: ?   General: Skin is warm and dry.  ?Neurological:  ?   Mental Status: He is alert and oriented to person, place, and time.  ?Psychiatric:     ?   Mood and Affect: Mood normal.     ?   Behavior: Behavior normal.     ?   Thought Content: Thought content normal.     ?   Judgment: Judgment normal.  ? ? ?BP 105/66 (BP Location: Right Arm)   Pulse 83   Resp 16   Ht 6\' 1"  (1.854 m)   Wt 284 lb 12.8 oz (129.2 kg)   BMI 37.57 kg/m?  ? ?Past Medical History:  ?Diagnosis Date  ? 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[place]) 09/23/2020  ? CHF (congestive heart failure) ([place])   ? Coronary artery disease   ? Diabetes mellitus without complication ([place])   ? Dialysis patient [facility])   ? M-W-F  ? GERD (gastroesophageal reflux disease)   ? Hypertension   ? Kyrle's disease   ? Renal disorder   ? on dialysis for 4 years three times a week last was Tuesday  ? Secondary hyperparathyroidism of renal origin [facility])   ? Shortness of breath dyspnea   ? Type 2 diabetes mellitus with diabetic nephropathy ([place])   ? Type 2  diabetes mellitus with diabetic retinopathy ([place])   ? ? ?Social History  ? ?Socioeconomic History  ? Marital status: Single  ?  Spouse name: Not on file  ? Number of children: Not on file  ? Years of education: Not on file  ? Highest education level: Not on file  ?Occupational History  ? Not on file  ?Tobacco Use  ? Smoking status: Never  ? Smokeless tobacco: Never  ?Vaping Use  ? Vaping Use: Never used  ?Substance and Sexual Activity  ? Alcohol use: Not Currently  ?  Comment: occ. shot  ? Drug use: No  ? Sexual activity: Not on file  ?Other Topics Concern  ? Not on file  ?Social History Narrative  ? Lives by himself  ? ?Social Determinants of Health  ? ?Financial Resource Strain: Not on file  ?Food Insecurity: Not on file  ?Transportation Needs: Not on file  ?Physical Activity: Not on file  ?Stress: Not on file  ?Social Connections: Not on file  ?Intimate Partner Violence: Not on file  ? ? ?  Past Surgical History:  ?Procedure Laterality Date  ? A/V FISTULAGRAM Left 10/01/2017  ? Procedure: A/V FISTULAGRAM;  Surgeon: Algernon Huxley, MD;  Location: [place] CV LAB;  Service: Cardiovascular;  Laterality: Left;  ? A/V FISTULAGRAM Left 05/17/2020  ? Procedure: A/V FISTULAGRAM;  Surgeon: Algernon Huxley, MD;  Location: [place] [facility] LAB;  Service: Cardiovascular;  Laterality: Left;  ? A/V SHUNTOGRAM Left 04/16/2021  ? Procedure: A/V SHUNTOGRAM;  Surgeon: Katha Cabal, MD;  Location: [place] CV LAB;  Service: Cardiovascular;  Laterality: Left;  ? CATARACT EXTRACTION W/PHACO Right 06/30/2018  ? Procedure: CATARACT EXTRACTION PHACO AND INTRAOCULAR LENS PLACEMENT (IOC)-RIGHT;  Surgeon: Eulogio Bear, MD;  Location: [facility] ORS;  Service: Ophthalmology;  Laterality: Right;  [place] 00:07.0 ?CDE 0.16 ?FLUID PACK lOT # [id number]  ? CATARACT EXTRACTION W/PHACO Left 12/24/2018  ? Procedure: CATARACT EXTRACTION PHACO AND INTRAOCULAR LENS PLACEMENT (IOC);  Surgeon: Eulogio Bear, MD;  Location: [facility] ORS;  Service:  Ophthalmology;  Laterality: Left;  [place] 00:30.0 ?CDE 2.01 ?FLUID PACK LOT # [id number]  ? COLONOSCOPY WITH PROPOFOL N/A 04/23/2017  ? Procedure: COLONOSCOPY WITH PROPOFOL;  Surgeon: [name], MD;  Location: [facility] ENDOSCOPY;  Service: Endoscopy;  Laterality: N/A;  ? COLONOSCOPY WITH PROPOFOL N/A 07/02/2017  ? Procedure: COLONOSCOPY WITH PROPOFOL;  Surgeon: [name], MD;  Location: [facility] ENDOSCOPY;  Service: Endoscopy;  Laterality: N/A;  ? DIALYSIS/PERMA CATHETER INSERTION N/A 09/27/2020  ? Procedure: DIALYSIS/PERMA CATHETER INSERTION;  Surgeon: Algernon Huxley, MD;  Location: [place] CV LAB;  Service: Cardiovascular;  Laterality: N/A;  ? DIALYSIS/PERMA CATHETER REMOVAL N/A 11/01/2020  ? Procedure: DIALYSIS/PERMA CATHETER REMOVAL;  Surgeon: Algernon Huxley, MD;  Location: [place] CV LAB;  Service: Cardiovascular;  Laterality: N/A;  ? ESOPHAGOGASTRODUODENOSCOPY (EGD) WITH PROPOFOL N/A 04/23/2017  ? Procedure: ESOPHAGOGASTRODUODENOSCOPY (EGD) WITH PROPOFOL;  Surgeon: [name], MD;  Location: [facility] ENDOSCOPY;  Service: Endoscopy;  Laterality: N/A;  ? ESOPHAGOGASTRODUODENOSCOPY (EGD) WITH PROPOFOL N/A 03/27/2020  ? Procedure: ESOPHAGOGASTRODUODENOSCOPY (EGD) WITH PROPOFOL;  Surgeon: Lesly Rubenstein, MD;  Location: [facility] ENDOSCOPY;  Service: Endoscopy;  Laterality: N/A;  ? EYE SURGERY    ? RETINA  ? INSERTION OF DIALYSIS CATHETER Right 08/09/2020  ? Procedure: INSERTION OF DIALYSIS CATHETER;  Surgeon: Algernon Huxley, MD;  Location: [facility] ORS;  Service: Vascular;  Laterality: Right;  ? PERIPHERAL VASCULAR CATHETERIZATION N/A 05/07/2015  ? Procedure: A/V Shuntogram/Fistulagram;  Surgeon: Katha Cabal, MD;  Location: [place] CV LAB;  Service: Cardiovascular;  Laterality: N/A;  ? PERIPHERAL VASCULAR CATHETERIZATION Left 05/07/2015  ? Procedure: A/V Shunt Intervention;  Surgeon: Katha Cabal, MD;  Location: [place] CV LAB;  Service: Cardiovascular;  Laterality: Left;  ? PERIPHERAL  VASCULAR THROMBECTOMY Left 07/16/2020  ? Procedure: PERIPHERAL VASCULAR THROMBECTOMY;  Surgeon: Algernon Huxley, MD;  Location: [place] [facility] LAB;  Service: Cardiovascular;  Laterality: Left;  ? PERIPHERAL VASCULAR THROMBECTOMY Left 08/27/2020  ? Procedure: PERIPHERAL VASCULAR THROMBECTOMY;  Surgeon: Algernon Huxley, MD;  Location: [place] CV LAB;  Service: Cardiovascular;  Laterality: Left;  ? REVISON OF ARTERIOVENOUS FISTULA Left 08/09/2020  ? Procedure: REVISON OF ARTERIOVENOUS FISTULA;  Surgeon: Algernon Huxley, MD;  Location: [facility];  Service: Vascular;  Laterality: Left;  ? TEE WITHOUT CARDIOVERSION N/A 07/05/2015  ? Procedure: TRANSESOPHAGEAL ECHOCARDIOGRAM (TEE);  Surgeon: Dionisio David, MD;  Location: [facility] ORS;  Service: Cardiovascular;  Laterality: N/A;  ? UPPER EXTREMITY ANGIOGRAPHY Left 06/24/2018  ? Procedure: UPPER  EXTREMITY ANGIOGRAPHY;  Surgeon: Algernon Huxley, MD;  Location: [place] CV LAB;  Service: Cardiovascular;  Laterality: Left;  ? VASCULAR ACCESS DEVICE INSERTION Left 07/11/2021  ? Procedure: INSERTION OF HERO VASCULAR ACCESS DEVICE;  Surgeon: Algernon Huxley, MD;  Location: [facility] ORS;  Service: Vascular;  Laterality: Left;  ? WOUND DEBRIDEMENT N/A 09/25/2020  ? Procedure: DEBRIDEMENT WOUND;  Surgeon: Katha Cabal, MD;  Location: [facility] ORS;  Service: Vascular;  Laterality: N/A;  ? ? ?Family History  ?Problem Relation Age of Onset  ? Diabetes Father   ? ? ?Allergies  ?Allergen Reactions  ? Cephalexin Nausea Only  ? Ivp Dye [Iodinated Contrast Media] Diarrhea  ? Lisinopril Other (See Comments)  ?  Pt cannot recall reaction, cough  ? Other   ? ? ? ?  Latest Ref Rng & Units 07/11/2021  ? 11:35 AM 07/04/2021  ?  8:48 AM 04/17/2021  ?  5:10 AM  ?CBC  ?WBC 4.0 - 10.5 K/uL  6.1   7.8    ?Hemoglobin 13.0 - 17.0 g/dL 16.0   13.8   12.6    ?Hematocrit 39.0 - 52.0 % 47.0   46.4   39.5    ?Platelets 150 - 400 K/uL  199   158    ? ? ? ? ?CMP  ?   ?Component Value Date/Time  ? NA 135 07/11/2021 1135  ? NA 138  02/24/2014 0359  ? K 5.4 (H) 07/11/2021 1135  ? K 3.5 02/24/2014 0359  ? CL 102 07/11/2021 1135  ? CL 97 (L) 02/24/2014 0359  ? CO2 24 07/04/2021 0848  ? CO2 31 02/24/2014 0359  ? GLUCOSE 272 (H) 07/11/2021 1135  ? GLUCOSE 2

## 2021-08-22 ENCOUNTER — Other Ambulatory Visit: Payer: Self-pay

## 2021-08-22 ENCOUNTER — Ambulatory Visit (INDEPENDENT_AMBULATORY_CARE_PROVIDER_SITE_OTHER): Payer: Medicare Other | Admitting: Nurse Practitioner

## 2021-08-22 ENCOUNTER — Encounter (INDEPENDENT_AMBULATORY_CARE_PROVIDER_SITE_OTHER): Payer: Self-pay | Admitting: Nurse Practitioner

## 2021-08-22 ENCOUNTER — Emergency Department: Payer: Medicare Other

## 2021-08-22 ENCOUNTER — Emergency Department
Admission: EM | Admit: 2021-08-22 | Discharge: 2021-08-22 | Disposition: A | Discharge status: Home / Self Care | Payer: Medicare Other | Source: Home / Self Care | Attending: Emergency Medicine | Admitting: Emergency Medicine

## 2021-08-22 ENCOUNTER — Encounter: Payer: Self-pay | Admitting: Emergency Medicine

## 2021-08-22 VITALS — BP 103/71 | HR 83 | Resp 17 | Ht 73.0 in | Wt 285.0 lb

## 2021-08-22 DIAGNOSIS — N186 End stage renal disease: Secondary | ICD-10-CM | POA: Insufficient documentation

## 2021-08-22 DIAGNOSIS — E1122 Type 2 diabetes mellitus with diabetic chronic kidney disease: Secondary | ICD-10-CM | POA: Insufficient documentation

## 2021-08-22 DIAGNOSIS — I132 Hypertensive heart and chronic kidney disease with heart failure and with stage 5 chronic kidney disease, or end stage renal disease: Secondary | ICD-10-CM | POA: Insufficient documentation

## 2021-08-22 DIAGNOSIS — D649 Anemia, unspecified: Secondary | ICD-10-CM | POA: Insufficient documentation

## 2021-08-22 DIAGNOSIS — Z992 Dependence on renal dialysis: Secondary | ICD-10-CM | POA: Insufficient documentation

## 2021-08-22 DIAGNOSIS — R42 Dizziness and giddiness: Secondary | ICD-10-CM | POA: Insufficient documentation

## 2021-08-22 DIAGNOSIS — I10 Essential (primary) hypertension: Secondary | ICD-10-CM

## 2021-08-22 DIAGNOSIS — I2510 Atherosclerotic heart disease of native coronary artery without angina pectoris: Secondary | ICD-10-CM | POA: Insufficient documentation

## 2021-08-22 DIAGNOSIS — I951 Orthostatic hypotension: Secondary | ICD-10-CM | POA: Diagnosis not present

## 2021-08-22 DIAGNOSIS — I509 Heart failure, unspecified: Secondary | ICD-10-CM | POA: Insufficient documentation

## 2021-08-22 DIAGNOSIS — Z20822 Contact with and (suspected) exposure to covid-19: Secondary | ICD-10-CM | POA: Insufficient documentation

## 2021-08-22 DIAGNOSIS — E1165 Type 2 diabetes mellitus with hyperglycemia: Secondary | ICD-10-CM | POA: Insufficient documentation

## 2021-08-22 DIAGNOSIS — R55 Syncope and collapse: Secondary | ICD-10-CM | POA: Diagnosis not present

## 2021-08-22 LAB — CBC
HCT: 39.9 % (ref 39.0–52.0)
Hemoglobin: 12.3 g/dL — ABNORMAL LOW (ref 13.0–17.0)
MCH: 27.3 pg (ref 26.0–34.0)
MCHC: 30.8 g/dL (ref 30.0–36.0)
MCV: 88.7 fL (ref 80.0–100.0)
Platelets: 135 10*3/uL — ABNORMAL LOW (ref 150–400)
RBC: 4.5 MIL/uL (ref 4.22–5.81)
RDW: 15.9 % — ABNORMAL HIGH (ref 11.5–15.5)
WBC: 6.8 10*3/uL (ref 4.0–10.5)
nRBC: 0 % (ref 0.0–0.2)

## 2021-08-22 LAB — BASIC METABOLIC PANEL
Anion gap: 15 (ref 5–15)
BUN: 43 mg/dL — ABNORMAL HIGH (ref 6–20)
CO2: 24 mmol/L (ref 22–32)
Calcium: 9 mg/dL (ref 8.9–10.3)
Chloride: 95 mmol/L — ABNORMAL LOW (ref 98–111)
Creatinine, Ser: 13.95 mg/dL — ABNORMAL HIGH (ref 0.61–1.24)
GFR, Estimated: 4 mL/min — ABNORMAL LOW (ref >60)
Glucose, Bld: 311 mg/dL — ABNORMAL HIGH (ref 70–99)
Potassium: 4.3 mmol/L (ref 3.5–5.1)
Sodium: 134 mmol/L — ABNORMAL LOW (ref 135–145)

## 2021-08-22 LAB — CBG MONITORING, ED: Glucose-Capillary: 266 mg/dL — ABNORMAL HIGH (ref 70–99)

## 2021-08-22 LAB — TROPONIN I (HIGH SENSITIVITY): Troponin I (High Sensitivity): 14 ng/L (ref <18)

## 2021-08-22 LAB — RESP PANEL BY RT-PCR (FLU A&B, COVID) ARPGX2
Influenza A by PCR: NEGATIVE
Influenza B by PCR: NEGATIVE
SARS Coronavirus 2 by RT PCR: NEGATIVE

## 2021-08-22 LAB — MAGNESIUM: Magnesium: 1.9 mg/dL (ref 1.7–2.4)

## 2021-08-22 MED ORDER — SODIUM CHLORIDE 0.9 % IV BOLUS: 1000.0000 mL | Freq: Once | INTRAVENOUS | Status: DC

## 2021-08-22 NOTE — ED Provider Notes (Signed)
? ?[facility] ?Provider Note ? ? ? Event Date/Time  ? First MD Initiated Contact with Patient 08/22/21 1046   ?  (approximate) ? ? ?History  ? ?Chief Complaint ?Dizziness ? ? ?HPI ?[name] [name] is a 46 y.o. male, history of ESRD (M, W, F), diabetes, hypertension, chronic venous insufficiency, CAD, obesity, hypertension, CHF, presents to the emergency department for evaluation of dizziness.  Patient states that he has been feeling dizzy for the past couple days.  Described as intermittent episodes of lightheadedness as opposed to vertigo type sensation.  Additionally endorses shortness of breath, cough, and body aches as well.  He states that his symptoms have improved over the past day, but he is unsure what is causing them.  During his symptoms, he did not take any of his medications for his blood pressure or diabetes, however just started taking them last night.  Denies fever/chills, chest pain, abdominal pain, flank pain, nausea/vomiting, diarrhea, vision changes, hearing changes, rash/lesions, or numbness/tingling in upper or lower extremities.  His last dialysis appointment was yesterday. ? ?History Limitations: No limitations. ? ?    ? ? ?Physical Exam  ?Triage Vital Signs: ?ED Triage Vitals  ?Enc Vitals Group  ?   BP 08/22/21 1030 123/88  ?   Pulse Rate 08/22/21 1030 87  ?   Resp 08/22/21 1030 17  ?   Temp 08/22/21 1030 98.8 ?F (37.1 ?C)  ?   Temp Source 08/22/21 1030 Oral  ?   SpO2 08/22/21 1030 93 %  ?   Weight 08/22/21 1016 285 lb (129.3 kg)  ?   Height 08/22/21 1016 6\' 1"  (1.854 m)  ?   Head Circumference --   ?   Peak Flow --   ?   Pain Score 08/22/21 1016 4  ?   Pain Loc --   ?   Pain Edu? --   ?   Excl. in [place]? --   ? ? ?Most recent vital signs: ?Vitals:  ? 08/22/21 1030  ?BP: 123/88  ?Pulse: 87  ?Resp: 17  ?Temp: 98.8 ?F (37.1 ?C)  ?SpO2: 93%  ? ? ?General: Awake, NAD.  ?Skin: Warm, dry. No rashes or lesions.  ?Eyes: PERRL. Conjunctivae normal.  ?CV: Good peripheral perfusion.   S1-S2 present.  No murmurs, rubs, or gallops. ?Resp: Normal effort.  Lung sounds are clear bilaterally in the apices and bases. ?Abd: Soft, non-tender. No distention.  ?Neuro: At baseline. No gross neurological deficits.  ? ?Focused Exam: Ear exam unremarkable bilaterally.  No vertical or horizontal nystagmus appreciated. ? ?Physical Exam ? ? ? ?ED Results / Procedures / Treatments  ?Labs ?(all labs ordered are listed, but only abnormal results are displayed) ?Labs Reviewed  ?BASIC METABOLIC PANEL - Abnormal; Notable for the following components:  ?    Result Value  ? Sodium 134 (*)   ? Chloride 95 (*)   ? Glucose, Bld 311 (*)   ? BUN 43 (*)   ? Creatinine, Ser 13.95 (*)   ? GFR, Estimated 4 (*)   ? All other components within normal limits  ?CBC - Abnormal; Notable for the following components:  ? Hemoglobin 12.3 (*)   ? RDW 15.9 (*)   ? Platelets 135 (*)   ? All other components within normal limits  ?CBG MONITORING, ED - Abnormal; Notable for the following components:  ? Glucose-Capillary 266 (*)   ? All other components within normal limits  ?RESP PANEL BY RT-PCR (FLU A&B,  COVID) ARPGX2  ?MAGNESIUM  ?TROPONIN I (HIGH SENSITIVITY)  ? ? ? ?EKG ?Sinus rhythm, rate of 83, no ST segment changes, no axis deviations, no AV blocks, normal QRS interval. ? ? ?RADIOLOGY ? ?ED Provider Interpretation: I personally reviewed and interpreted this chest x-ray, no evidence of acute cardiopulmonary abnormalities. ? ?DG Chest 2 View ? ?Result Date: 08/22/2021 ?CLINICAL DATA:  46 year old male with shortness of breath and dizziness for several days. EXAM: CHEST - 2 VIEW COMPARISON:  Chest radiographs 01/29/2021 and earlier. FINDINGS: PA and lateral views. Right chest dual lumen dialysis type catheter. Left IJ approach elongated vascular cannula or catheter. Left subclavian and axillary region vascular stents. Normal cardiac size and mediastinal contours. Visualized tracheal air column is within normal limits. Mildly lower lung  volumes compared to last year. No pneumothorax, pulmonary edema, pleural effusion or confluent pulmonary opacity. No acute osseous abnormality identified. Negative visible bowel gas. IMPRESSION: No acute cardiopulmonary abnormality. Electronically Signed   By: Genevie Ann M.D.   On: 08/22/2021 11:19   ? ?PROCEDURES: ? ?Critical Care performed: None. ? ?Procedures ? ? ? ?MEDICATIONS ORDERED IN ED: ?Medications - No data to display ? ? ? ?IMPRESSION / MDM / ASSESSMENT AND PLAN / ED COURSE  ?I reviewed the triage vital signs and the nursing notes. ?             ?               ? ?Differential diagnosis includes, but is not limited to, anemia, arrhythmia, COVID-19/influenza, pneumonia, electrolyte disturbance, BPPV. ? ? ?ED Course ?Patient appears well, vitals are within normal limits.  NAD.  Currently afebrile.  Not requesting any pain/nausea medication at this time. ? ?CBC shows no leukocytosis.  Mild anemia present at 12.3, though consistent with his previous values. ? ?CBG shows elevated glucose at 266, consistent with history of diabetes.  He has not been taking his medications the past few days. ? ?CMP shows no remarkable electrolyte abnormalities.  Creatinine at baseline. ? ?Respiratory panel shows negative for COVID-19 and influenza. ? ?EKG unremarkable.  Initial troponin 14. ? ?Magnesium unremarkable at 1.9 ? ?Assessment/Plan ?Patient presents with intermittent lightheadedness x 3 days.  No syncopal episodes.  Unknown etiology at this point.  EKG is unremarkable.  Lab work-up has been reassuring.  He is currently asymptomatic at this time.  Given his endorsement of body aches and cough, he may potentially be experiencing a viral respiratory infection, though his chest x-ray is reassuring for no evidence of pneumonia or edema.  No serious or life-threatening pathology suspected.  Advised him to follow-up with his primary care provider as needed.  We will plan to discharge ? ?Considered admission for this patient,  but given the patient's stable presentation, unremarkable work-up, he is unlikely to benefit. ? ?Provided the patient with anticipatory guidance, return precautions, and educational material. Encouraged the patient to return to the emergency department at any time if they begin to experience any new or worsening symptoms. Patient expressed understanding and agreed with the plan.  ? ?  ? ? ?FINAL CLINICAL IMPRESSION(S) / ED DIAGNOSES  ? ?Final diagnoses:  ?Lightheadedness  ? ? ? ?Rx / DC Orders  ? ?ED Discharge Orders   ? ? None  ? ?  ? ? ? ?Note:  This document was prepared using Dragon voice recognition software and may include unintentional dictation errors. ?  ?Teodoro Spray, [place] ?08/22/21 1346 ? ?  ?Duffy Bruce, MD ?08/23/21 2005 ? ?  ?  Duffy Bruce, MD ?08/23/21 2006 ? ?

## 2021-08-22 NOTE — Discharge Instructions (Addendum)
-  Continue to hydrate appropriately. ?-Continue taking all of your medications, as prescribed. ?-Follow-up with your primary care provider as needed. ?-Return to the emergency department anytime if you begin to experience any new or worsening symptoms ?

## 2021-08-22 NOTE — ED Notes (Signed)
Pt went to x-ray.

## 2021-08-22 NOTE — ED Triage Notes (Signed)
Pt comes into the ED via POV c/o dizziness that has been ongoing for a couple days.  Pt states he has HTN and DM, but he just started taking his medicines again.  Pt admits to decreased appetite lately and episodes of feeling as though he was going to pass out.  Pt currently in NAD with even and unlabored respirations.  ?

## 2021-08-24 ENCOUNTER — Encounter: Payer: Self-pay | Admitting: Internal Medicine

## 2021-08-24 ENCOUNTER — Emergency Department: Payer: Medicare Other

## 2021-08-24 ENCOUNTER — Inpatient Hospital Stay
Admission: EM | Admit: 2021-08-24 | Discharge: 2021-09-01 | DRG: 252 | Disposition: A | Discharge status: Skilled Nursing Facility | Payer: Medicare Other | Attending: Internal Medicine | Admitting: Internal Medicine

## 2021-08-24 ENCOUNTER — Other Ambulatory Visit: Payer: Self-pay

## 2021-08-24 DIAGNOSIS — Y832 Surgical operation with anastomosis, bypass or graft as the cause of abnormal reaction of the patient, or of later complication, without mention of misadventure at the time of the procedure: Secondary | ICD-10-CM | POA: Diagnosis present

## 2021-08-24 DIAGNOSIS — Z992 Dependence on renal dialysis: Secondary | ICD-10-CM | POA: Diagnosis not present

## 2021-08-24 DIAGNOSIS — D631 Anemia in chronic kidney disease: Secondary | ICD-10-CM | POA: Diagnosis present

## 2021-08-24 DIAGNOSIS — R197 Diarrhea, unspecified: Secondary | ICD-10-CM | POA: Diagnosis present

## 2021-08-24 DIAGNOSIS — I2510 Atherosclerotic heart disease of native coronary artery without angina pectoris: Secondary | ICD-10-CM | POA: Diagnosis present

## 2021-08-24 DIAGNOSIS — I471 Supraventricular tachycardia: Secondary | ICD-10-CM | POA: Diagnosis present

## 2021-08-24 DIAGNOSIS — T82868A Thrombosis of vascular prosthetic devices, implants and grafts, initial encounter: Secondary | ICD-10-CM | POA: Diagnosis present

## 2021-08-24 DIAGNOSIS — N2581 Secondary hyperparathyroidism of renal origin: Secondary | ICD-10-CM | POA: Diagnosis present

## 2021-08-24 DIAGNOSIS — E1122 Type 2 diabetes mellitus with diabetic chronic kidney disease: Secondary | ICD-10-CM | POA: Diagnosis present

## 2021-08-24 DIAGNOSIS — I120 Hypertensive chronic kidney disease with stage 5 chronic kidney disease or end stage renal disease: Secondary | ICD-10-CM | POA: Diagnosis present

## 2021-08-24 DIAGNOSIS — D72829 Elevated white blood cell count, unspecified: Secondary | ICD-10-CM | POA: Diagnosis present

## 2021-08-24 DIAGNOSIS — Z79899 Other long term (current) drug therapy: Secondary | ICD-10-CM | POA: Diagnosis not present

## 2021-08-24 DIAGNOSIS — W19XXXA Unspecified fall, initial encounter: Secondary | ICD-10-CM | POA: Diagnosis present

## 2021-08-24 DIAGNOSIS — Z6837 Body mass index (BMI) 37.0-37.9, adult: Secondary | ICD-10-CM

## 2021-08-24 DIAGNOSIS — I10 Essential (primary) hypertension: Secondary | ICD-10-CM | POA: Diagnosis not present

## 2021-08-24 DIAGNOSIS — E11319 Type 2 diabetes mellitus with unspecified diabetic retinopathy without macular edema: Secondary | ICD-10-CM | POA: Diagnosis present

## 2021-08-24 DIAGNOSIS — Z888 Allergy status to other drugs, medicaments and biological substances status: Secondary | ICD-10-CM | POA: Diagnosis not present

## 2021-08-24 DIAGNOSIS — E1165 Type 2 diabetes mellitus with hyperglycemia: Secondary | ICD-10-CM | POA: Diagnosis present

## 2021-08-24 DIAGNOSIS — Y828 Other medical devices associated with adverse incidents: Secondary | ICD-10-CM | POA: Diagnosis present

## 2021-08-24 DIAGNOSIS — R55 Syncope and collapse: Secondary | ICD-10-CM | POA: Diagnosis present

## 2021-08-24 DIAGNOSIS — R778 Other specified abnormalities of plasma proteins: Secondary | ICD-10-CM | POA: Diagnosis not present

## 2021-08-24 DIAGNOSIS — Z7982 Long term (current) use of aspirin: Secondary | ICD-10-CM | POA: Diagnosis not present

## 2021-08-24 DIAGNOSIS — K219 Gastro-esophageal reflux disease without esophagitis: Secondary | ICD-10-CM | POA: Diagnosis present

## 2021-08-24 DIAGNOSIS — Z91199 Patient's noncompliance with other medical treatment and regimen due to unspecified reason: Secondary | ICD-10-CM

## 2021-08-24 DIAGNOSIS — I248 Other forms of acute ischemic heart disease: Secondary | ICD-10-CM | POA: Diagnosis present

## 2021-08-24 DIAGNOSIS — Z91148 Patient's other noncompliance with medication regimen for other reason: Secondary | ICD-10-CM | POA: Diagnosis not present

## 2021-08-24 DIAGNOSIS — Z833 Family history of diabetes mellitus: Secondary | ICD-10-CM

## 2021-08-24 DIAGNOSIS — Z20822 Contact with and (suspected) exposure to covid-19: Secondary | ICD-10-CM | POA: Diagnosis present

## 2021-08-24 DIAGNOSIS — I951 Orthostatic hypotension: Principal | ICD-10-CM | POA: Diagnosis present

## 2021-08-24 DIAGNOSIS — E871 Hypo-osmolality and hyponatremia: Secondary | ICD-10-CM | POA: Diagnosis present

## 2021-08-24 DIAGNOSIS — E669 Obesity, unspecified: Secondary | ICD-10-CM | POA: Diagnosis present

## 2021-08-24 DIAGNOSIS — N186 End stage renal disease: Secondary | ICD-10-CM | POA: Diagnosis present

## 2021-08-24 DIAGNOSIS — Z794 Long term (current) use of insulin: Secondary | ICD-10-CM

## 2021-08-24 DIAGNOSIS — E869 Volume depletion, unspecified: Secondary | ICD-10-CM | POA: Diagnosis present

## 2021-08-24 LAB — CBC WITH DIFFERENTIAL/PLATELET
Abs Immature Granulocytes: 0.04 10*3/uL (ref 0.00–0.07)
Basophils Absolute: 0.1 10*3/uL (ref 0.0–0.1)
Basophils Relative: 1 %
Eosinophils Absolute: 0.2 10*3/uL (ref 0.0–0.5)
Eosinophils Relative: 2 %
HCT: 46.3 % (ref 39.0–52.0)
Hemoglobin: 13.8 g/dL (ref 13.0–17.0)
Immature Granulocytes: 0 %
Lymphocytes Relative: 6 %
Lymphs Abs: 0.7 10*3/uL (ref 0.7–4.0)
MCH: 27.3 pg (ref 26.0–34.0)
MCHC: 29.8 g/dL — ABNORMAL LOW (ref 30.0–36.0)
MCV: 91.7 fL (ref 80.0–100.0)
Monocytes Absolute: 0.7 10*3/uL (ref 0.1–1.0)
Monocytes Relative: 6 %
Neutro Abs: 10.4 10*3/uL — ABNORMAL HIGH (ref 1.7–7.7)
Neutrophils Relative %: 85 %
Platelets: 168 10*3/uL (ref 150–400)
RBC: 5.05 MIL/uL (ref 4.22–5.81)
RDW: 15.9 % — ABNORMAL HIGH (ref 11.5–15.5)
WBC: 12.1 10*3/uL — ABNORMAL HIGH (ref 4.0–10.5)
nRBC: 0 % (ref 0.0–0.2)

## 2021-08-24 LAB — COMPREHENSIVE METABOLIC PANEL
ALT: 9 U/L (ref 0–44)
AST: 15 U/L (ref 15–41)
Albumin: 3.7 g/dL (ref 3.5–5.0)
Alkaline Phosphatase: 77 U/L (ref 38–126)
Anion gap: 17 — ABNORMAL HIGH (ref 5–15)
BUN: 35 mg/dL — ABNORMAL HIGH (ref 6–20)
CO2: 22 mmol/L (ref 22–32)
Calcium: 9.4 mg/dL (ref 8.9–10.3)
Chloride: 97 mmol/L — ABNORMAL LOW (ref 98–111)
Creatinine, Ser: 13.57 mg/dL — ABNORMAL HIGH (ref 0.61–1.24)
GFR, Estimated: 4 mL/min — ABNORMAL LOW (ref >60)
Glucose, Bld: 234 mg/dL — ABNORMAL HIGH (ref 70–99)
Potassium: 3.9 mmol/L (ref 3.5–5.1)
Sodium: 136 mmol/L (ref 135–145)
Total Bilirubin: 0.5 mg/dL (ref 0.3–1.2)
Total Protein: 8.6 g/dL — ABNORMAL HIGH (ref 6.5–8.1)

## 2021-08-24 LAB — GLUCOSE, CAPILLARY: Glucose-Capillary: 172 mg/dL — ABNORMAL HIGH (ref 70–99)

## 2021-08-24 LAB — RESP PANEL BY RT-PCR (FLU A&B, COVID) ARPGX2
Influenza A by PCR: NEGATIVE
Influenza B by PCR: NEGATIVE
SARS Coronavirus 2 by RT PCR: NEGATIVE

## 2021-08-24 LAB — MRSA NEXT GEN BY PCR, NASAL: MRSA by PCR Next Gen: NOT DETECTED

## 2021-08-24 LAB — MAGNESIUM: Magnesium: 2.1 mg/dL (ref 1.7–2.4)

## 2021-08-24 LAB — TROPONIN I (HIGH SENSITIVITY)
Troponin I (High Sensitivity): 270 ng/L (ref <18)
Troponin I (High Sensitivity): 330 ng/L (ref <18)

## 2021-08-24 MED ORDER — ASPIRIN EC 81 MG PO TBEC
81.0000 mg | DELAYED_RELEASE_TABLET | Freq: Every day | ORAL | Status: DC
  Administered 2021-08-25 – 2021-08-31 (×7): 81 mg via ORAL
  Filled 2021-08-24 (×7): qty 1

## 2021-08-24 MED ORDER — LOSARTAN POTASSIUM 50 MG PO TABS
50.0000 mg | ORAL_TABLET | Freq: Every day | ORAL | Status: DC
  Administered 2021-08-25 – 2021-08-31 (×5): 50 mg via ORAL
  Filled 2021-08-24 (×6): qty 1

## 2021-08-24 MED ORDER — HYDROCODONE-ACETAMINOPHEN 5-325 MG PO TABS
2.0000 | ORAL_TABLET | Freq: Four times a day (QID) | ORAL | Status: DC | PRN
  Administered 2021-08-24: 1 via ORAL
  Administered 2021-08-27 (×2): 2 via ORAL
  Filled 2021-08-24 (×3): qty 2

## 2021-08-24 MED ORDER — POLYETHYLENE GLYCOL 3350 17 G PO PACK: 17.0000 g | PACK | Freq: Every day | ORAL | Status: DC | PRN

## 2021-08-24 MED ORDER — INSULIN ASPART 100 UNIT/ML IJ SOLN
0.0000 [IU] | Freq: Three times a day (TID) | INTRAMUSCULAR | Status: DC
  Administered 2021-08-25: 5 [IU] via SUBCUTANEOUS
  Administered 2021-08-25: 2 [IU] via SUBCUTANEOUS
  Administered 2021-08-26 (×2): 4 [IU] via SUBCUTANEOUS
  Administered 2021-08-27: 2 [IU] via SUBCUTANEOUS
  Administered 2021-08-27 – 2021-08-28 (×2): 1 [IU] via SUBCUTANEOUS
  Administered 2021-08-28 – 2021-08-29 (×2): 2 [IU] via SUBCUTANEOUS
  Administered 2021-08-29: 6 [IU] via SUBCUTANEOUS
  Administered 2021-08-30: 5 [IU] via SUBCUTANEOUS
  Administered 2021-08-30 (×2): 4 [IU] via SUBCUTANEOUS
  Administered 2021-08-31: 2 [IU] via SUBCUTANEOUS
  Administered 2021-08-31: 3 [IU] via SUBCUTANEOUS
  Filled 2021-08-24 (×16): qty 1

## 2021-08-24 MED ORDER — PANTOPRAZOLE SODIUM 40 MG PO TBEC
40.0000 mg | DELAYED_RELEASE_TABLET | Freq: Every day | ORAL | Status: DC
Start: 2021-08-24 — End: 2021-09-01
  Administered 2021-08-25 – 2021-09-01 (×7): 40 mg via ORAL
  Filled 2021-08-24 (×7): qty 1

## 2021-08-24 MED ORDER — SEVELAMER CARBONATE 2.4 G PO PACK
2.4000 g | PACK | Freq: Three times a day (TID) | ORAL | Status: DC
  Administered 2021-08-25 – 2021-09-01 (×18): 2.4 g via ORAL
  Filled 2021-08-24 (×26): qty 1

## 2021-08-24 MED ORDER — CINACALCET HCL 30 MG PO TABS
60.0000 mg | ORAL_TABLET | Freq: Every day | ORAL | Status: DC
  Administered 2021-08-25 – 2021-09-01 (×7): 60 mg via ORAL
  Filled 2021-08-24 (×8): qty 2

## 2021-08-24 MED ORDER — ASPIRIN 81 MG PO CHEW
324.0000 mg | CHEWABLE_TABLET | Freq: Once | ORAL | Status: AC
  Administered 2021-08-24: 324 mg via ORAL
  Filled 2021-08-24: qty 4

## 2021-08-24 MED ORDER — SODIUM CHLORIDE 0.9 % IV SOLN: INTRAVENOUS | Status: AC

## 2021-08-24 MED ORDER — BISACODYL 5 MG PO TBEC: 5.0000 mg | DELAYED_RELEASE_TABLET | Freq: Every day | ORAL | Status: DC | PRN

## 2021-08-24 MED ORDER — PNEUMOCOCCAL 20-VAL CONJ VACC 0.5 ML IM SUSY
0.5000 mL | PREFILLED_SYRINGE | INTRAMUSCULAR | Status: DC
  Filled 2021-08-24: qty 0.5

## 2021-08-24 MED ORDER — ACETAMINOPHEN 325 MG PO TABS: 650.0000 mg | ORAL_TABLET | Freq: Four times a day (QID) | ORAL | Status: DC | PRN

## 2021-08-24 MED ORDER — CARVEDILOL 25 MG PO TABS
25.0000 mg | ORAL_TABLET | Freq: Two times a day (BID) | ORAL | Status: DC
  Administered 2021-08-25 – 2021-09-01 (×11): 25 mg via ORAL
  Filled 2021-08-24 (×11): qty 1

## 2021-08-24 MED ORDER — ACETAMINOPHEN 650 MG RE SUPP: 650.0000 mg | Freq: Four times a day (QID) | RECTAL | Status: DC | PRN

## 2021-08-24 MED ORDER — INSULIN ASPART PROT & ASPART (70-30 MIX) 100 UNIT/ML ~~LOC~~ SUSP
10.0000 [IU] | SUBCUTANEOUS | Status: DC
  Administered 2021-08-25 – 2021-08-29 (×2): 10 [IU] via SUBCUTANEOUS
  Filled 2021-08-24 (×2): qty 10

## 2021-08-24 MED ORDER — HEPARIN SODIUM (PORCINE) 5000 UNIT/ML IJ SOLN
5000.0000 [IU] | Freq: Three times a day (TID) | INTRAMUSCULAR | Status: DC
  Administered 2021-08-24 – 2021-08-31 (×19): 5000 [IU] via SUBCUTANEOUS
  Filled 2021-08-24 (×19): qty 1

## 2021-08-24 MED ORDER — HYDROXYZINE HCL 50 MG PO TABS
25.0000 mg | ORAL_TABLET | Freq: Every day | ORAL | Status: DC
  Administered 2021-08-25 – 2021-08-31 (×7): 25 mg via ORAL
  Filled 2021-08-24 (×8): qty 1

## 2021-08-24 MED ORDER — LOPERAMIDE HCL 2 MG PO CAPS
2.0000 mg | ORAL_CAPSULE | ORAL | Status: DC | PRN
  Administered 2021-08-25: 2 mg via ORAL
  Filled 2021-08-24: qty 1

## 2021-08-24 NOTE — H&P (Addendum)
? ? ?History and Physical:  ? ? ?[name] [name]  ? ?XAJ:[id number] DOB: [birth year] DOA: 08/24/2021 ? ?Referring MD/provider: Dr. Duffy Bruce ?PCP: Danelle Berry, NP  ? ?Patient coming from: Home ? ?Chief Complaint: Syncope ? ?History of Present Illness:  ? ?[name] [name] is an 46 y.o. male with HTN, poorly controlled DM 2, ESRD on HD MWF, CAD was recently seen in the ED 2 days ago with complaints of malaise, decreased p.o. intake with nausea and dizziness.  He was noted to be orthostatic at the time and was given gentle hydration and discharged home.  Patient states that he was doing okay although was feeling rather weak and continued to be nauseated until 4 AM today when he got up to do his laundry.  He states he is felt very dizzy and then "I hit the floor".  Patient called EMS who noted that he had elevated blood sugar but told him he did not need to come to the ED.  Patient's mother also came and made him some bacon and eggs and also did his laundry.  Patient had another episode of syncope on his way back from the bathroom this afternoon.  He notes again that he felt very dizzy and before he could steady himself he "hit the floor".  EMS was called again and this time they brought him to the ED.  Per report, on route to the ED they thought they saw either SVT or VT but they were not sure.  No strip is available. ? ?At present, patient states that he is weak and tired.  He notes he is hungry and thirsty.  Denies dizziness while he is sitting in bed.  Thinks he might get dizzy if he tried to get up.  Patient denies any chest pain or palpitations.  Denies any shortness of breath or DOE.  Just notes that he feels weak.  Patient notes he has been keeping up with his dialysis on Monday Wednesday and Friday, was last dialyzed yesterday.  Patient also notes he has not been taking his medications, neither his blood pressure nor his insulin because he was not eating very much and he was feeling dizzy. ? ? ?ED  Course:  The patient was noted to be afebrile and somewhat hypertensive.  It is unclear if patient was orthostatic or not, patient was in bed throughout and felt too weak to stand up.  Laboratory data were notable for mild leukocytosis with WBC of 12 with a left shift.  He is also noted to have an elevated troponin at 278, increased from 14 two days ago. ? ? ?ROS:  ? ?ROS  ? ?Review of Systems: ?As per HPI ? ? ?Past Medical History:  ? ?Past Medical History:  ?Diagnosis Date  ? 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[place]) 09/23/2020  ? CHF (congestive heart failure) ([place])   ? Coronary artery disease   ? Diabetes mellitus without complication ([place])   ? Dialysis patient [facility])   ? M-W-F  ? GERD (gastroesophageal reflux disease)   ? Hypertension   ? Kyrle's disease   ? Renal disorder   ? on dialysis for 4 years three times a week last was Tuesday  ? Secondary hyperparathyroidism of renal origin [facility])   ? Shortness of breath dyspnea   ? Type 2 diabetes mellitus with diabetic nephropathy ([place])   ? Type 2 diabetes mellitus with diabetic retinopathy ([place])   ? ? ?Past Surgical History:  ? ?Past Surgical History:  ?Procedure Laterality  Date  ? A/V FISTULAGRAM Left 10/01/2017  ? Procedure: A/V FISTULAGRAM;  Surgeon: Algernon Huxley, MD;  Location: [place] CV LAB;  Service: Cardiovascular;  Laterality: Left;  ? A/V FISTULAGRAM Left 05/17/2020  ? Procedure: A/V FISTULAGRAM;  Surgeon: Algernon Huxley, MD;  Location: [place] CV LAB;  Service: Cardiovascular;  Laterality: Left;  ? A/V SHUNTOGRAM Left 04/16/2021  ? Procedure: A/V SHUNTOGRAM;  Surgeon: Katha Cabal, MD;  Location: [place] CV LAB;  Service: Cardiovascular;  Laterality: Left;  ? CATARACT EXTRACTION W/PHACO Right 06/30/2018  ? Procedure: CATARACT EXTRACTION PHACO AND INTRAOCULAR LENS PLACEMENT (IOC)-RIGHT;  Surgeon: Eulogio Bear, MD;  Location: [facility] ORS;  Service: Ophthalmology;  Laterality: Right;  [place] 00:07.0 ?CDE 0.16 ?FLUID PACK lOT # [id number]  ? CATARACT EXTRACTION  W/PHACO Left 12/24/2018  ? Procedure: CATARACT EXTRACTION PHACO AND INTRAOCULAR LENS PLACEMENT (IOC);  Surgeon: Eulogio Bear, MD;  Location: [facility] ORS;  Service: Ophthalmology;  Laterality: Left;  [place] 00:30.0 ?CDE 2.01 ?FLUID PACK LOT # [id number]  ? COLONOSCOPY WITH PROPOFOL N/A 04/23/2017  ? Procedure: COLONOSCOPY WITH PROPOFOL;  Surgeon: [name], MD;  Location: [facility] ENDOSCOPY;  Service: Endoscopy;  Laterality: N/A;  ? COLONOSCOPY WITH PROPOFOL N/A 07/02/2017  ? Procedure: COLONOSCOPY WITH PROPOFOL;  Surgeon: [name], MD;  Location: [facility] ENDOSCOPY;  Service: Endoscopy;  Laterality: N/A;  ? DIALYSIS/PERMA CATHETER INSERTION N/A 09/27/2020  ? Procedure: DIALYSIS/PERMA CATHETER INSERTION;  Surgeon: Algernon Huxley, MD;  Location: [place] CV LAB;  Service: Cardiovascular;  Laterality: N/A;  ? DIALYSIS/PERMA CATHETER REMOVAL N/A 11/01/2020  ? Procedure: DIALYSIS/PERMA CATHETER REMOVAL;  Surgeon: Algernon Huxley, MD;  Location: [place] CV LAB;  Service: Cardiovascular;  Laterality: N/A;  ? ESOPHAGOGASTRODUODENOSCOPY (EGD) WITH PROPOFOL N/A 04/23/2017  ? Procedure: ESOPHAGOGASTRODUODENOSCOPY (EGD) WITH PROPOFOL;  Surgeon: [name], MD;  Location: [facility] ENDOSCOPY;  Service: Endoscopy;  Laterality: N/A;  ? ESOPHAGOGASTRODUODENOSCOPY (EGD) WITH PROPOFOL N/A 03/27/2020  ? Procedure: ESOPHAGOGASTRODUODENOSCOPY (EGD) WITH PROPOFOL;  Surgeon: Lesly Rubenstein, MD;  Location: [facility] ENDOSCOPY;  Service: Endoscopy;  Laterality: N/A;  ? EYE SURGERY    ? RETINA  ? INSERTION OF DIALYSIS CATHETER Right 08/09/2020  ? Procedure: INSERTION OF DIALYSIS CATHETER;  Surgeon: Algernon Huxley, MD;  Location: [facility] ORS;  Service: Vascular;  Laterality: Right;  ? PERIPHERAL VASCULAR CATHETERIZATION N/A 05/07/2015  ? Procedure: A/V Shuntogram/Fistulagram;  Surgeon: Katha Cabal, MD;  Location: [place] CV LAB;  Service: Cardiovascular;  Laterality: N/A;  ? PERIPHERAL VASCULAR CATHETERIZATION Left 05/07/2015  ?  Procedure: A/V Shunt Intervention;  Surgeon: Katha Cabal, MD;  Location: [place] CV LAB;  Service: Cardiovascular;  Laterality: Left;  ? PERIPHERAL VASCULAR THROMBECTOMY Left 07/16/2020  ? Procedure: PERIPHERAL VASCULAR THROMBECTOMY;  Surgeon: Algernon Huxley, MD;  Location: [place] CV LAB;  Service: Cardiovascular;  Laterality: Left;  ? PERIPHERAL VASCULAR THROMBECTOMY Left 08/27/2020  ? Procedure: PERIPHERAL VASCULAR THROMBECTOMY;  Surgeon: Algernon Huxley, MD;  Location: [place] CV LAB;  Service: Cardiovascular;  Laterality: Left;  ? REVISON OF ARTERIOVENOUS FISTULA Left 08/09/2020  ? Procedure: REVISON OF ARTERIOVENOUS FISTULA;  Surgeon: Algernon Huxley, MD;  Location: [facility] ORS;  Service: Vascular;  Laterality: Left;  ? TEE WITHOUT CARDIOVERSION N/A 07/05/2015  ? Procedure: TRANSESOPHAGEAL ECHOCARDIOGRAM (TEE);  Surgeon: Dionisio David, MD;  Location: [facility] ORS;  Service: Cardiovascular;  Laterality: N/A;  ? UPPER EXTREMITY ANGIOGRAPHY Left 06/24/2018  ? Procedure: UPPER EXTREMITY ANGIOGRAPHY;  Surgeon: [name]  S, MD;  Location: [place] CV LAB;  Service: Cardiovascular;  Laterality: Left;  ? VASCULAR ACCESS DEVICE INSERTION Left 07/11/2021  ? Procedure: INSERTION OF HERO VASCULAR ACCESS DEVICE;  Surgeon: Algernon Huxley, MD;  Location: [facility] ORS;  Service: Vascular;  Laterality: Left;  ? WOUND DEBRIDEMENT N/A 09/25/2020  ? Procedure: DEBRIDEMENT WOUND;  Surgeon: Katha Cabal, MD;  Location: [facility] ORS;  Service: Vascular;  Laterality: N/A;  ? ? ?Social History:  ? ?Social History  ? ?Socioeconomic History  ? Marital status: Single  ?  Spouse name: Not on file  ? Number of children: Not on file  ? Years of education: Not on file  ? Highest education level: Not on file  ?Occupational History  ? Not on file  ?Tobacco Use  ? Smoking status: Never  ? Smokeless tobacco: Never  ?Vaping Use  ? Vaping Use: Never used  ?Substance and Sexual Activity  ? Alcohol use: Not Currently  ?  Comment: occ. shot  ?  Drug use: No  ? Sexual activity: Not on file  ?Other Topics Concern  ? Not on file  ?Social History Narrative  ? Lives by himself  ? ?Social Determinants of Health  ? ?Financial Resource Strain: Not on file

## 2021-08-24 NOTE — ED Notes (Signed)
Attending messaged about pt needing floor orders prior to transfer,  ? ?

## 2021-08-24 NOTE — ED Notes (Signed)
Pt asking for social work to assist in home care at time of DC. ?

## 2021-08-24 NOTE — ED Notes (Signed)
Pt unable to stand for orthostatic vs at this time due to fear of syncopizing ?

## 2021-08-24 NOTE — ED Notes (Signed)
Pt to IP unit with Baron Sane RN ED ?

## 2021-08-24 NOTE — ED Notes (Signed)
Unable to start IV on patient with one attempt to stick. ?

## 2021-08-24 NOTE — ED Notes (Signed)
Called lab about repeat trop ? ?

## 2021-08-24 NOTE — ED Notes (Signed)
Repeat trop lab tube sent to lab, messaged md about order ?

## 2021-08-24 NOTE — ED Provider Notes (Signed)
? ?[facility] ?Provider Note ? ? ? Event Date/Time  ? First MD Initiated Contact with Patient 08/24/21 1638   ?  (approximate) ? ? ?History  ? ?Loss of Consciousness ? ? ?HPI ? ?[name] [name] is a 46 y.o. male  with PMHx ESRD here with loss of consciousness.  Patient states that over the last week, he has had progressively worsening weakness, lightheadedness, and dizziness with standing.  He was actually seen in the ED several days ago, after he had had decreased appetite and nausea with vomiting for several days.  He had had decreased appetite at that time and was orthostatic.  He felt better after fluids and was sent home.  He states that since then, he initially felt better, went to dialysis, and since then has been feeling bad again.  He feels lightheaded and dizzy whenever he stands up.  Today, he had 2 episodes in which she passed out, and states that he felt very weak just prior to this.  There was no seizure-like activity.  He regained consciousness fairly quickly.  He states he feels weak and tired.  No other specific complaints.  No chest pain.  No focal numbness or weakness. ?  ? ? ?Physical Exam  ? ?Triage Vital Signs: ?ED Triage Vitals  ?Enc Vitals Group  ?   BP 08/24/21 1624 136/90  ?   Pulse Rate 08/24/21 1624 96  ?   Resp 08/24/21 1624 11  ?   Temp 08/24/21 1624 98.6 ?F (37 ?C)  ?   Temp src --   ?   SpO2 08/24/21 1618 98 %  ?   Weight 08/24/21 1625 282 lb 3 oz (128 kg)  ?   Height --   ?   Head Circumference --   ?   Peak Flow --   ?   Pain Score 08/24/21 1624 10  ?   Pain Loc --   ?   Pain Edu? --   ?   Excl. in [place]? --   ? ? ?Most recent vital signs: ?Vitals:  ? 08/25/21 0130 08/25/21 0133  ?BP: 93/65 104/78  ?Pulse: (!) 112 100  ?Resp:    ?Temp:    ?SpO2:    ? ? ? ?General: Awake, no distress.  ?CV:  Good peripheral perfusion.  ?Resp:  Normal effort.  Lungs clear to auscultation bilaterally. ?Abd:  No distention.  No tenderness. ?Other:  Mildly dry mucous membranes.   Cranials 2 through 12 intact.  Strength out of 5 bilateral upper and lower extremities.  Normal sensation light touch. ? ? ?ED Results / Procedures / Treatments  ? ?Labs ?(all labs ordered are listed, but only abnormal results are displayed) ?Labs Reviewed  ?CBC WITH DIFFERENTIAL/PLATELET - Abnormal; Notable for the following components:  ?    Result Value  ? WBC 12.1 (*)   ? MCHC 29.8 (*)   ? RDW 15.9 (*)   ? Neutro Abs 10.4 (*)   ? All other components within normal limits  ?COMPREHENSIVE METABOLIC PANEL - Abnormal; Notable for the following components:  ? Chloride 97 (*)   ? Glucose, Bld 234 (*)   ? BUN 35 (*)   ? Creatinine, Ser 13.57 (*)   ? Total Protein 8.6 (*)   ? GFR, Estimated 4 (*)   ? Anion gap 17 (*)   ? All other components within normal limits  ?HEMOGLOBIN A1C - Abnormal; Notable for the following components:  ? Hgb A1c  MFr Bld 10.0 (*)   ? All other components within normal limits  ?CBC - Abnormal; Notable for the following components:  ? Hemoglobin 12.4 (*)   ? RDW 15.8 (*)   ? All other components within normal limits  ?GLUCOSE, CAPILLARY - Abnormal; Notable for the following components:  ? Glucose-Capillary 172 (*)   ? All other components within normal limits  ?TROPONIN I (HIGH SENSITIVITY) - Abnormal; Notable for the following components:  ? Troponin I (High Sensitivity) 270 (*)   ? All other components within normal limits  ?TROPONIN I (HIGH SENSITIVITY) - Abnormal; Notable for the following components:  ? Troponin I (High Sensitivity) 330 (*)   ? All other components within normal limits  ?RESP PANEL BY RT-PCR (FLU A&B, COVID) [id number]  ?MRSA NEXT GEN BY PCR, NASAL  ?MAGNESIUM  ?BASIC METABOLIC PANEL  ?BRAIN NATRIURETIC PEPTIDE  ?TROPONIN I (HIGH SENSITIVITY)  ? ? ? ?EKG ?Normal sinus rhythm, ventricular 99.  PR 173, QRS 110, QTc 444.  No acute ST elevations or depressions.  No EKG evidence of acute ischemia or infarct. ? ? ?RADIOLOGY ?Chest x-ray: Low lung volumes, possible vascular  congestion ?CT head: No acute intracranial abnormality ? ? ?I also independently reviewed and agree with radiologist interpretations. ? ? ?PROCEDURES: ? ?Critical Care performed: No ? ?.1-3 Lead EKG Interpretation ?Performed by: Duffy Bruce, MD ?Authorized by: Duffy Bruce, MD  ? ?  Interpretation: normal   ?  ECG rate:  80-110 ?  ECG rate assessment: normal   ?  Rhythm: sinus rhythm   ?  Ectopy: none   ?  Conduction: normal   ?Comments:  ?   Indication: syncope ? ? ? ?MEDICATIONS ORDERED IN ED: ?Medications  ?aspirin EC tablet 81 mg (has no administration in time range)  ?HYDROcodone-acetaminophen (NORCO/VICODIN) 5-325 MG per tablet 2 tablet (1 tablet Oral Given 08/24/21 2206)  ?carvedilol (COREG) tablet 25 mg (25 mg Oral Given 08/25/21 0102)  ?losartan (COZAAR) tablet 50 mg (50 mg Oral Given 08/25/21 0102)  ?hydrOXYzine (ATARAX) tablet 25 mg (25 mg Oral Given 08/25/21 0102)  ?cinacalcet (SENSIPAR) tablet 60 mg (has no administration in time range)  ?insulin aspart protamine- aspart (NOVOLOG MIX 70/30) injection 10 Units (has no administration in time range)  ?loperamide (IMODIUM) capsule 2 mg (has no administration in time range)  ?pantoprazole (PROTONIX) EC tablet 40 mg (has no administration in time range)  ?sevelamer carbonate (RENVELA) powder PACK 2.4 g (has no administration in time range)  ?insulin aspart (novoLOG) injection 0-6 Units (has no administration in time range)  ?heparin injection 5,000 Units (5,000 Units Subcutaneous Given 08/24/21 2206)  ?0.9 %  sodium chloride infusion ( Intravenous New Bag/Given 08/24/21 2058)  ?acetaminophen (TYLENOL) tablet 650 mg (has no administration in time range)  ?  Or  ?acetaminophen (TYLENOL) suppository 650 mg (has no administration in time range)  ?polyethylene glycol (MIRALAX / GLYCOLAX) packet 17 g (has no administration in time range)  ?bisacodyl (DULCOLAX) EC tablet 5 mg (has no administration in time range)  ?pneumococcal 20-valent conjugate vaccine (PREVNAR  20) injection 0.5 mL (has no administration in time range)  ?insulin aspart protamine- aspart (NOVOLOG MIX 70/30) injection 30 Units (has no administration in time range)  ?aspirin chewable tablet 324 mg (324 mg Oral Given 08/24/21 1904)  ? ? ? ?IMPRESSION / MDM / ASSESSMENT AND PLAN / ED COURSE  ?I reviewed the triage vital signs and the nursing notes. ?             ?               ? ? ?  The patient is on the cardiac monitor to evaluate for evidence of arrhythmia and/or significant heart rate changes. ? ?MDM:  ?46 yo M with h/o HTN, ESRD, here with recurrent lightheadedness/syncope. Pt was just here for similar issues. Of note, sx seem to correlate with recent decreased PO intake, as well as post-dialysis days. Pt description is most c/w orthostasis, and clinically sx sound like relative hypovolemia/orthostasis. EKG nonischemic here, but trop elevated - unclear significance. Denies any CP. Will admit for obs. Lytes otherwise acceptable for ESRD status. No significant change in anemia. ? ? ?MEDICATIONS GIVEN IN ED: ?Medications  ?aspirin EC tablet 81 mg (has no administration in time range)  ?HYDROcodone-acetaminophen (NORCO/VICODIN) 5-325 MG per tablet 2 tablet (1 tablet Oral Given 08/24/21 2206)  ?carvedilol (COREG) tablet 25 mg (25 mg Oral Given 08/25/21 0102)  ?losartan (COZAAR) tablet 50 mg (50 mg Oral Given 08/25/21 0102)  ?hydrOXYzine (ATARAX) tablet 25 mg (25 mg Oral Given 08/25/21 0102)  ?cinacalcet (SENSIPAR) tablet 60 mg (has no administration in time range)  ?insulin aspart protamine- aspart (NOVOLOG MIX 70/30) injection 10 Units (has no administration in time range)  ?loperamide (IMODIUM) capsule 2 mg (has no administration in time range)  ?pantoprazole (PROTONIX) EC tablet 40 mg (has no administration in time range)  ?sevelamer carbonate (RENVELA) powder PACK 2.4 g (has no administration in time range)  ?insulin aspart (novoLOG) injection 0-6 Units (has no administration in time range)  ?heparin injection  5,000 Units (5,000 Units Subcutaneous Given 08/24/21 2206)  ?0.9 %  sodium chloride infusion ( Intravenous New Bag/Given 08/24/21 2058)  ?acetaminophen (TYLENOL) tablet 650 mg (has no administration in time range)  ?  Or

## 2021-08-24 NOTE — ED Triage Notes (Signed)
Pt arrives today with [facility] after multiple episodes of syncope with +LOC and hitting his head today. Pt syncopized at 4am this morning but refused to come to hospital for treatment. Pt was found down on floor and assisted to stretcher when they appeared to have seizure like activity according to EMS. Once placed on monitor EMS endorsing possible episode of SVT or Vtach but was unable to capture. Pt vss while laying were 111/80 compared to 155/80 while sitting up. Pt was very diaphoretic enroute to hosptial but otherwise vss. Cbg was 171. Pt is a dialysis patient and had not missed any treatments (dialysis is MWF). Pt endorsing head, neck and back pain at this and believes they hit their head during the fall. Pt appears alert and oriented at this time. Pt has fistula in L arm and R chest dialysis catheter.  ?

## 2021-08-25 DIAGNOSIS — D72829 Elevated white blood cell count, unspecified: Secondary | ICD-10-CM

## 2021-08-25 DIAGNOSIS — R55 Syncope and collapse: Secondary | ICD-10-CM | POA: Diagnosis not present

## 2021-08-25 DIAGNOSIS — N186 End stage renal disease: Secondary | ICD-10-CM | POA: Diagnosis not present

## 2021-08-25 DIAGNOSIS — R778 Other specified abnormalities of plasma proteins: Secondary | ICD-10-CM

## 2021-08-25 DIAGNOSIS — E1165 Type 2 diabetes mellitus with hyperglycemia: Secondary | ICD-10-CM

## 2021-08-25 DIAGNOSIS — Z992 Dependence on renal dialysis: Secondary | ICD-10-CM

## 2021-08-25 LAB — CBC
HCT: 40.5 % (ref 39.0–52.0)
HCT: 40.4 % (ref 39.0–52.0)
Hemoglobin: 12.4 g/dL — ABNORMAL LOW (ref 13.0–17.0)
Hemoglobin: 12.3 g/dL — ABNORMAL LOW (ref 13.0–17.0)
MCH: 27.4 pg (ref 26.0–34.0)
MCH: 27.6 pg (ref 26.0–34.0)
MCHC: 30.6 g/dL (ref 30.0–36.0)
MCHC: 30.4 g/dL (ref 30.0–36.0)
MCV: 89.4 fL (ref 80.0–100.0)
MCV: 90.6 fL (ref 80.0–100.0)
Platelets: 177 10*3/uL (ref 150–400)
Platelets: 215 10*3/uL (ref 150–400)
RBC: 4.53 MIL/uL (ref 4.22–5.81)
RBC: 4.46 MIL/uL (ref 4.22–5.81)
RDW: 15.8 % — ABNORMAL HIGH (ref 11.5–15.5)
RDW: 15.8 % — ABNORMAL HIGH (ref 11.5–15.5)
WBC: 9.3 10*3/uL (ref 4.0–10.5)
WBC: 7.8 10*3/uL (ref 4.0–10.5)
nRBC: 0 % (ref 0.0–0.2)
nRBC: 0 % (ref 0.0–0.2)

## 2021-08-25 LAB — BASIC METABOLIC PANEL
Anion gap: 13 (ref 5–15)
BUN: 40 mg/dL — ABNORMAL HIGH (ref 6–20)
CO2: 22 mmol/L (ref 22–32)
Calcium: 8.9 mg/dL (ref 8.9–10.3)
Chloride: 99 mmol/L (ref 98–111)
Creatinine, Ser: 14.52 mg/dL — ABNORMAL HIGH (ref 0.61–1.24)
GFR, Estimated: 4 mL/min — ABNORMAL LOW (ref >60)
Glucose, Bld: 266 mg/dL — ABNORMAL HIGH (ref 70–99)
Potassium: 4.1 mmol/L (ref 3.5–5.1)
Sodium: 134 mmol/L — ABNORMAL LOW (ref 135–145)

## 2021-08-25 LAB — COMPREHENSIVE METABOLIC PANEL
ALT: 9 U/L (ref 0–44)
AST: 15 U/L (ref 15–41)
Albumin: 3.3 g/dL — ABNORMAL LOW (ref 3.5–5.0)
Alkaline Phosphatase: 74 U/L (ref 38–126)
Anion gap: 16 — ABNORMAL HIGH (ref 5–15)
BUN: 50 mg/dL — ABNORMAL HIGH (ref 6–20)
CO2: 19 mmol/L — ABNORMAL LOW (ref 22–32)
Calcium: 8.8 mg/dL — ABNORMAL LOW (ref 8.9–10.3)
Chloride: 102 mmol/L (ref 98–111)
Creatinine, Ser: 16.15 mg/dL — ABNORMAL HIGH (ref 0.61–1.24)
GFR, Estimated: 3 mL/min — ABNORMAL LOW (ref >60)
Glucose, Bld: 192 mg/dL — ABNORMAL HIGH (ref 70–99)
Potassium: 4.3 mmol/L (ref 3.5–5.1)
Sodium: 137 mmol/L (ref 135–145)
Total Bilirubin: 0.4 mg/dL (ref 0.3–1.2)
Total Protein: 7.7 g/dL (ref 6.5–8.1)

## 2021-08-25 LAB — GLUCOSE, CAPILLARY
Glucose-Capillary: 233 mg/dL — ABNORMAL HIGH (ref 70–99)
Glucose-Capillary: 394 mg/dL — ABNORMAL HIGH (ref 70–99)
Glucose-Capillary: 290 mg/dL — ABNORMAL HIGH (ref 70–99)
Glucose-Capillary: 165 mg/dL — ABNORMAL HIGH (ref 70–99)

## 2021-08-25 LAB — HEMOGLOBIN A1C
Hgb A1c MFr Bld: 10 % — ABNORMAL HIGH (ref 4.8–5.6)
Mean Plasma Glucose: 240.3 mg/dL

## 2021-08-25 LAB — BLOOD GAS, ARTERIAL
Acid-base deficit: 5.3 mmol/L — ABNORMAL HIGH (ref 0.0–2.0)
Bicarbonate: 19.1 mmol/L — ABNORMAL LOW (ref 20.0–28.0)
O2 Content: 4 L/min
O2 Saturation: 99.5 %
Patient temperature: 37
pCO2 arterial: 33 mmHg (ref 32–48)
pH, Arterial: 7.37 (ref 7.35–7.45)
pO2, Arterial: 111 mmHg — ABNORMAL HIGH (ref 83–108)

## 2021-08-25 LAB — TROPONIN I (HIGH SENSITIVITY)
Troponin I (High Sensitivity): 308 ng/L (ref <18)
Troponin I (High Sensitivity): 168 ng/L (ref <18)
Troponin I (High Sensitivity): 78 ng/L — ABNORMAL HIGH (ref <18)

## 2021-08-25 LAB — MAGNESIUM: Magnesium: 2.2 mg/dL (ref 1.7–2.4)

## 2021-08-25 LAB — BRAIN NATRIURETIC PEPTIDE: B Natriuretic Peptide: 823.1 pg/mL — ABNORMAL HIGH (ref 0.0–100.0)

## 2021-08-25 LAB — LACTIC ACID, PLASMA: Lactic Acid, Venous: 2.2 mmol/L (ref 0.5–1.9)

## 2021-08-25 MED ORDER — INSULIN ASPART PROT & ASPART (70-30 MIX) 100 UNIT/ML ~~LOC~~ SUSP
35.0000 [IU] | Freq: Every day | SUBCUTANEOUS | Status: DC
  Administered 2021-08-26 – 2021-08-30 (×5): 35 [IU] via SUBCUTANEOUS
  Filled 2021-08-25 (×3): qty 10

## 2021-08-25 MED ORDER — INSULIN ASPART PROT & ASPART (70-30 MIX) 100 UNIT/ML ~~LOC~~ SUSP: 30.0000 [IU] | Freq: Every day | SUBCUTANEOUS | Status: DC

## 2021-08-25 MED ORDER — CHLORHEXIDINE GLUCONATE CLOTH 2 % EX PADS
6.0000 | MEDICATED_PAD | Freq: Every day | CUTANEOUS | Status: DC
  Administered 2021-08-25 – 2021-09-01 (×6): 6 via TOPICAL

## 2021-08-25 NOTE — Progress Notes (Signed)
Chart reviewed, full consult to follow. ?

## 2021-08-25 NOTE — Significant Event (Signed)
CODE BLUE NOTE ? ?Patient Name: [name] [name]   ?MRN: [id number]   ?Date of Birth/ Sex: 1975/05/03 , male   ?   ?Admission Date: 08/24/2021  ?Attending Provider: Nolberto Hanlon, MD  ?Primary Diagnosis: Syncope ?  ? ? ?Indication: ?Pt was noted to be unresponsive after using the restroom, staff assisted him back to bed, initiated compressions, and a Code blue was called. At the time of arrival on scene [name] [name] had achieved ROSC and was initiating breaths independently. Staff report patient began to respond after six compressions. He is drowsy, makes eye contact, follows commands, and nods yes/no appropriately.He is currently on 4L via nasal cannula. Prior to leaving bedside [name] [name] was alert and able to hold a conversation. ? ?BP 155/104 ?HR 86 ?RR 16 ?SPO2 100% on 4L [place] ?  ? ?Technical Description:  ?- CPR performance duration:  <30 seconds  ?- Was defibrillation or cardioversion used? No   ?- Was external pacer placed? No  ?- Was patient intubated pre/post CPR? No  ? ? ?Medications Administered: Y = Yes; Blank = No ?Amiodarone    ?Atropine    ?Calcium    ?Epinephrine    ?Lidocaine    ?Magnesium    ?Norepinephrine    ?Phenylephrine    ?Sodium bicarbonate    ?Vasopressin    ? ? ?Post CPR evaluation:  ?- Final Status - Was patient successfully resuscitated ? Yes ?- What is current rhythm? NSR ?- What is current hemodynamic status? Hemodynamically stable ? ? ?Miscellaneous Information:  ?- Labs sent, including: CBC, CMP, Mg, Troponin, ABG, Lactic  ?- Primary team notified?  N/A  ?- Family Notified? Yes. Norma Sam called and updated on events and patient transfer to 2A.   ?- Additional notes/ transfer status: Transfer to 2A for closer monitoring.  ?Query if this was another syncopal event or vasovagal event with patient becoming bradycardic and lost consciousness.   ?   ?CRITICAL CARE ?Performed by: Jone Baseman  ? ? ?Total critical care time: 45 minutes ? ?Critical care time was exclusive of separately billable  procedures and treating other patients. ? ?Critical care was necessary to treat or prevent imminent or life-threatening deterioration. ? ?Critical care was time spent personally by me on the following activities: development of treatment plan with patient and/or surrogate as well as nursing, discussions with consultants, evaluation of patient's response to treatment, examination of patient, obtaining history from patient or surrogate, ordering and performing treatments and interventions, ordering and review of laboratory studies, ordering and review of radiographic studies, pulse oximetry and re-evaluation of patient's condition. ? ?Neomia Glass MHA, MSN, FNP-BC ?Nurse Practitioner ?[facility] ?[place] ?Pager [phone] ? ? ?

## 2021-08-25 NOTE — Progress Notes (Signed)
08/25/2021 at 2145: ? ?This RN alerted by NT that pt became unresponsive once transferred to bed from [facility]. This RN arrives at bedside with other staff members. Pt is unresponsive upon assessment. CPR initiated by staff. Code Blue initiated by staff. Pt starts to move and is becoming more responsive. Provider on call at bedside. Pt moving to higher level of care. Pt belongings at bedside. Awaiting any additional orders.  ?

## 2021-08-25 NOTE — Progress Notes (Addendum)
?PROGRESS NOTE ? ? ? ?[name] [name]  ZWC:[id number] DOB: [birth year] DOA: 08/24/2021 ?PCP: Danelle Berry, NP  ? ? ?Brief Narrative:  ?[name] [name] is an 46 y.o. male with HTN, poorly controlled DM 2, ESRD on HD MWF, CAD was recently seen in the ED 2 days ago with complaints of malaise, decreased p.o. intake with nausea and dizziness.  He was noted to be orthostatic at the time and was given gentle hydration and discharged home.  Patient states that he was doing okay although was feeling rather weak and continued to be nauseated until 4 AM today when he got up to do his laundry.  He states he is felt very dizzy and then "I hit the floor".  Patient called EMS who noted that he had elevated blood sugar but told him he did not need to come to the ED.  Patient's mother also came and made him some bacon and eggs and also did his laundry.  Patient had another episode of syncope on his way back from the bathroom this afternoon.  He notes again that he felt very dizzy and before he could steady himself he "hit the floor".  EMS was called again and this time they brought him to the ED.  Per report, on route to the ED they thought they saw either SVT or VT but they were not sure.  No strip is available. ?  ?At present, patient states that he is weak and tired.  He notes he is hungry and thirsty.  Denies dizziness while he is sitting in bed.  Thinks he might get dizzy if he tried to get up.  Patient denies any chest pain or palpitations.  Denies any shortness of breath or DOE.  Just notes that he feels weak.  Patient notes he has been keeping up with his dialysis on Monday Wednesday and Friday, was last dialyzed yesterday.  Patient also notes he has not been taking his medications, neither his blood pressure nor his insulin because he was not eating very much and he was feeling dizzy. ?  ?  ?ED Course:  The patient was noted to be afebrile and somewhat hypertensive.  It is unclear if patient was orthostatic or not,  patient was in bed throughout and felt too weak to stand up.  Laboratory data were notable for mild leukocytosis with WBC of 12 with a left shift.  He is also noted to have an elevated troponin at 278, increased from 14 two days ago. ? ?4/30 no complaints this am. Orthostatics ordered pending. Cardiology and nephrology consulted this am.  ? ?Consultants:  ? ? ?Procedures:  ? ?Antimicrobials:  ?  ? ? ?Subjective: ?No cp, sob, dizziness while in bed. ? ?Objective: ?Vitals:  ? 08/25/21 0133 08/25/21 0506 08/25/21 0753 08/25/21 1210  ?BP: 104/78 (!) 139/95 110/87 (!) 160/101  ?Pulse: 100 80 82 82  ?Resp:  14 17 16   ?Temp:  98 ?F (36.7 ?C) 97.8 ?F (36.6 ?C) 98.4 ?F (36.9 ?C)  ?TempSrc:      ?SpO2:  94% 96% 94%  ?Weight:      ?Height:      ? ? ?Intake/Output Summary (Last 24 hours) at 08/25/2021 1515 ?Last data filed at 08/25/2021 0500 ?Gross per 24 hour  ?Intake 977.18 ml  ?Output --  ?Net 977.18 ml  ? ?Filed Weights  ? 08/24/21 1625 08/24/21 2103  ?Weight: 128 kg 126.8 kg  ? ? ?Examination: ?Calm, NAD ?Cta no w/r ?Reg s1/s2 no  gallop ?Soft benign +bs ?No edema ?Aaoxox3  ?Mood and affect appropriate in current setting  ? ? ? ?Data Reviewed: I have personally reviewed following labs and imaging studies ? ?CBC: ?Recent Labs  ?Lab 08/22/21 ?1217 08/24/21 ?1705 08/25/21 ?0144  ?WBC 6.8 12.1* 9.3  ?NEUTROABS  --  10.4*  --   ?HGB 12.3* 13.8 12.4*  ?HCT 39.9 46.3 40.5  ?MCV 88.7 91.7 89.4  ?PLT 135* 168 177  ? ?Basic Metabolic Panel: ?Recent Labs  ?Lab 08/22/21 ?1217 08/24/21 ?1705 08/25/21 ?0144  ?NA 134* 136 134*  ?K 4.3 3.9 4.1  ?CL 95* 97* 99  ?CO2 24 22 22   ?GLUCOSE 311* 234* 266*  ?BUN 43* 35* 40*  ?CREATININE 13.95* 13.57* 14.52*  ?CALCIUM 9.0 9.4 8.9  ?MG 1.9 2.1  --   ? ?GFR: ?Estimated Creatinine Clearance: 8.8 mL/min (A) (by C-G formula based on SCr of 14.52 mg/dL (H)). ?Liver Function Tests: ?Recent Labs  ?Lab 08/24/21 ?1705  ?AST 15  ?ALT 9  ?ALKPHOS 77  ?BILITOT 0.5  ?PROT 8.6*  ?ALBUMIN 3.7  ? ?No results for  input(s): LIPASE, AMYLASE in the last 168 hours. ?No results for input(s): AMMONIA in the last 168 hours. ?Coagulation Profile: ?No results for input(s): INR, PROTIME in the last 168 hours. ?Cardiac Enzymes: ?No results for input(s): CKTOTAL, CKMB, CKMBINDEX, TROPONINI in the last 168 hours. ?BNP (last 3 results) ?No results for input(s): PROBNP in the last 8760 hours. ?HbA1C: ?Recent Labs  ?  08/24/21 ?1705  ?HGBA1C 10.0*  ? ?CBG: ?Recent Labs  ?Lab 08/22/21 ?1027 08/24/21 ?2106 08/25/21 ?0755 08/25/21 ?1211  ?GLUCAP 266* 172* 233* 394*  ? ?Lipid Profile: ?No results for input(s): CHOL, HDL, LDLCALC, TRIG, CHOLHDL, LDLDIRECT in the last 72 hours. ?Thyroid Function Tests: ?No results for input(s): TSH, T4TOTAL, FREET4, T3FREE, THYROIDAB in the last 72 hours. ?Anemia Panel: ?No results for input(s): VITAMINB12, FOLATE, FERRITIN, TIBC, IRON, RETICCTPCT in the last 72 hours. ?Sepsis Labs: ?No results for input(s): PROCALCITON, LATICACIDVEN in the last 168 hours. ? ?Recent Results (from the past 240 hour(s))  ?Resp Panel by RT-PCR (Flu A&B, Covid) Nasopharyngeal Swab     Status: None  ? Collection Time: 08/22/21 12:00 PM  ? Specimen: Nasopharyngeal Swab; Nasopharyngeal(NP) swabs in vial transport medium  ?Result Value Ref Range Status  ? SARS Coronavirus 2 by RT PCR NEGATIVE NEGATIVE Final  ?  Comment: (NOTE) ?SARS-CoV-2 target nucleic acids are NOT DETECTED. ? ?The SARS-CoV-2 RNA is generally detectable in upper respiratory ?specimens during the acute phase of infection. The lowest ?concentration of SARS-CoV-2 viral copies this assay can detect is ?138 copies/mL. A negative result does not preclude SARS-Cov-2 ?infection and should not be used as the sole basis for treatment or ?other patient management decisions. A negative result may occur with  ?improper specimen collection/handling, submission of specimen other ?than nasopharyngeal swab, presence of viral mutation(s) within the ?areas targeted by this assay, and  inadequate number of viral ?copies(<138 copies/mL). A negative result must be combined with ?clinical observations, patient history, and epidemiological ?information. The expected result is Negative. ? ?Fact Sheet for Patients:  ?EntrepreneurPulse.com.au ? ?Fact Sheet for Healthcare Providers:  ?IncredibleEmployment.be ? ?This test is no t yet approved or cleared by the [place] FDA and  ?has been authorized for detection and/or diagnosis of SARS-CoV-2 by ?FDA under an Emergency Use Authorization (EUA). This EUA will remain  ?in effect (meaning this test can be used) for the duration of the ?COVID-19 declaration under Section  564(b)(1) of the Act, 21 ?U.S.C.section 360bbb-3(b)(1), unless the authorization is terminated  ?or revoked sooner.  ? ? ?  ? Influenza A by PCR NEGATIVE NEGATIVE Final  ? Influenza B by PCR NEGATIVE NEGATIVE Final  ?  Comment: (NOTE) ?The Xpert Xpress SARS-CoV-2/FLU/RSV plus assay is intended as an aid ?in the diagnosis of influenza from Nasopharyngeal swab specimens and ?should not be used as a sole basis for treatment. Nasal washings and ?aspirates are unacceptable for Xpert Xpress SARS-CoV-2/FLU/RSV ?testing. ? ?Fact Sheet for Patients: ?EntrepreneurPulse.com.au ? ?Fact Sheet for Healthcare Providers: ?IncredibleEmployment.be ? ?This test is not yet approved or cleared by the [place] FDA and ?has been authorized for detection and/or diagnosis of SARS-CoV-2 by ?FDA under an Emergency Use Authorization (EUA). This EUA will remain ?in effect (meaning this test can be used) for the duration of the ?COVID-19 declaration under Section 564(b)(1) of the Act, 21 U.S.C. ?section 360bbb-3(b)(1), unless the authorization is terminated or ?revoked. ? ?Performed at [facility], [place], ?[place] [address] ?  ?Resp Panel by RT-PCR (Flu A&B, Covid) Nasopharyngeal Swab     Status: None  ? Collection  Time: 08/24/21  6:22 PM  ? Specimen: Nasopharyngeal Swab; Nasopharyngeal(NP) swabs in vial transport medium  ?Result Value Ref Range Status  ? SARS Coronavirus 2 by RT PCR NEGATIVE NEGATIVE Final  ?  Comment: (NOT

## 2021-08-25 NOTE — Progress Notes (Signed)
08/25/2021 at 2320: ? ?Report given to Judson Roch, RN on unit 2A. ?

## 2021-08-26 ENCOUNTER — Inpatient Hospital Stay: Payer: Medicare Other

## 2021-08-26 ENCOUNTER — Inpatient Hospital Stay
Admit: 2021-08-26 | Discharge: 2021-08-26 | Disposition: A | Discharge status: Home / Self Care | Payer: Medicare Other | Attending: Internal Medicine | Admitting: Internal Medicine

## 2021-08-26 DIAGNOSIS — I10 Essential (primary) hypertension: Secondary | ICD-10-CM

## 2021-08-26 DIAGNOSIS — R55 Syncope and collapse: Secondary | ICD-10-CM | POA: Diagnosis not present

## 2021-08-26 DIAGNOSIS — R778 Other specified abnormalities of plasma proteins: Secondary | ICD-10-CM | POA: Diagnosis not present

## 2021-08-26 DIAGNOSIS — N186 End stage renal disease: Secondary | ICD-10-CM | POA: Diagnosis not present

## 2021-08-26 LAB — GLUCOSE, CAPILLARY
Glucose-Capillary: 322 mg/dL — ABNORMAL HIGH (ref 70–99)
Glucose-Capillary: 297 mg/dL — ABNORMAL HIGH (ref 70–99)
Glucose-Capillary: 322 mg/dL — ABNORMAL HIGH (ref 70–99)
Glucose-Capillary: 120 mg/dL — ABNORMAL HIGH (ref 70–99)

## 2021-08-26 LAB — LACTIC ACID, PLASMA: Lactic Acid, Venous: 1.5 mmol/L (ref 0.5–1.9)

## 2021-08-26 LAB — HEPATITIS B SURFACE ANTIGEN: Hepatitis B Surface Ag: NONREACTIVE

## 2021-08-26 LAB — HEPATITIS B SURFACE ANTIBODY,QUALITATIVE: Hep B S Ab: REACTIVE — AB

## 2021-08-26 MED ORDER — LIDOCAINE HCL (PF) 1 % IJ SOLN
5.0000 mL | INTRAMUSCULAR | Status: DC | PRN
  Filled 2021-08-26: qty 5

## 2021-08-26 MED ORDER — LIDOCAINE-PRILOCAINE 2.5-2.5 % EX CREA: 1.0000 "application " | TOPICAL_CREAM | CUTANEOUS | Status: DC | PRN

## 2021-08-26 MED ORDER — SODIUM CHLORIDE 0.9 % IV SOLN: 100.0000 mL | INTRAVENOUS | Status: DC | PRN

## 2021-08-26 MED ORDER — SODIUM CHLORIDE 0.9% FLUSH
3.0000 mL | Freq: Two times a day (BID) | INTRAVENOUS | Status: DC
  Administered 2021-08-26 – 2021-09-01 (×8): 3 mL via INTRAVENOUS

## 2021-08-26 MED ORDER — HEPARIN SODIUM (PORCINE) 1000 UNIT/ML IJ SOLN
INTRAMUSCULAR | Status: AC
  Administered 2021-08-26: 4200 [IU] via INTRAVENOUS_CENTRAL
  Filled 2021-08-26: qty 10

## 2021-08-26 MED ORDER — HEPARIN SODIUM (PORCINE) 1000 UNIT/ML DIALYSIS
1000.0000 [IU] | INTRAMUSCULAR | Status: DC | PRN
  Filled 2021-08-26 (×2): qty 1

## 2021-08-26 MED ORDER — ALTEPLASE 2 MG IJ SOLR: 2.0000 mg | Freq: Once | INTRAMUSCULAR | Status: DC | PRN

## 2021-08-26 MED ORDER — PENTAFLUOROPROP-TETRAFLUOROETH EX AERO: 1.0000 "application " | INHALATION_SPRAY | CUTANEOUS | Status: DC | PRN

## 2021-08-26 MED ORDER — ASPIRIN 81 MG PO CHEW
81.0000 mg | CHEWABLE_TABLET | ORAL | Status: AC
  Administered 2021-08-27: 81 mg via ORAL
  Filled 2021-08-26: qty 1

## 2021-08-26 NOTE — Progress Notes (Signed)
Hemodialysis Post Treatment Note: ? ?Tx date:08/26/2021 ?Tx time:3 hours ?Access:Right CVC ?UF Removed: 2L ? ?Note: HD completed. Tolerated well. No complications. Patient is asymptomatic.  ? ? ? ? ? ? ? ? ?  ?

## 2021-08-26 NOTE — Progress Notes (Signed)
?PROGRESS NOTE ? ? ? ?[name] [name]  EAV:[id number] DOB: 24-Aug-1975 DOA: 08/24/2021 ?PCP: Danelle Berry, NP  ? ? ?Brief Narrative:  ?[name] [name] is an 46 y.o. male with HTN, poorly controlled DM 2, ESRD on HD MWF, CAD was recently seen in the ED 2 days ago with complaints of malaise, decreased p.o. intake with nausea and dizziness.  He was noted to be orthostatic at the time and was given gentle hydration and discharged home.  Patient states that he was doing okay although was feeling rather weak and continued to be nauseated until 4 AM today when he got up to do his laundry.  He states he is felt very dizzy and then "I hit the floor".  Patient called EMS who noted that he had elevated blood sugar but told him he did not need to come to the ED.  Patient's mother also came and made him some bacon and eggs and also did his laundry.  Patient had another episode of syncope on his way back from the bathroom this afternoon.  He notes again that he felt very dizzy and before he could steady himself he "hit the floor".  EMS was called again and this time they brought him to the ED.  Per report, on route to the ED they thought they saw either SVT or VT but they were not sure.  No strip is available. ?  ?At present, patient states that he is weak and tired.  He notes he is hungry and thirsty.  Denies dizziness while he is sitting in bed.  Thinks he might get dizzy if he tried to get up.  Patient denies any chest pain or palpitations.  Denies any shortness of breath or DOE.  Just notes that he feels weak.  Patient notes he has been keeping up with his dialysis on Monday Wednesday and Friday, was last dialyzed yesterday.  Patient also notes he has not been taking his medications, neither his blood pressure nor his insulin because he was not eating very much and he was feeling dizzy. ?  ?  ?ED Course:  The patient was noted to be afebrile and somewhat hypertensive.  It is unclear if patient was orthostatic or not,  patient was in bed throughout and felt too weak to stand up.  Laboratory data were notable for mild leukocytosis with WBC of 12 with a left shift.  He is also noted to have an elevated troponin at 278, increased from 14 two days ago. ? ?4/30 no complaints this am. Orthostatics ordered pending. Cardiology and nephrology consulted this am.  ? ?5/1 this am patient was found unresponsive after using the restroom.  Initiated compression and CODE BLUE was called.  He achieved ROSC and was initiating breaths independently.  He responded after 6 compressions.  Cardiology was made aware this a.m. by me. Currently in HD without any complaints.  ? ?Consultants:  ?Nephrology, cardiology ? ?Procedures:  ?Hemodialysis ? ? ?Antimicrobials:  ?  ? ? ?Subjective: ?No chest pain or shortness of breath, no dizziness ? ?Objective: ?Vitals:  ? 08/26/21 1246 08/26/21 1300 08/26/21 1406 08/26/21 1414  ?BP:  117/84  (!) 127/48  ?Pulse: 88 93  70  ?Resp: 19 (!) 22  18  ?Temp:  99.1 ?F (37.3 ?C)  99.2 ?F (37.3 ?C)  ?TempSrc:  Oral    ?SpO2:  97%    ?Weight:  125 kg 126.3 kg   ?Height:      ? ? ?Intake/Output Summary (  Last 24 hours) at 08/26/2021 1509 ?Last data filed at 08/26/2021 1246 ?Gross per 24 hour  ?Intake --  ?Output 2000 ml  ?Net -2000 ml  ? ?Filed Weights  ? 08/26/21 4097 08/26/21 1300 08/26/21 1406  ?Weight: 127.8 kg 125 kg 126.3 kg  ? ? ?Examination: ?Calm, NAD ?Cta no w/r ?Reg s1/s2 no gallop ?Soft benign +bs ?No edema ?Aaoxox3  ?Mood and affect appropriate in current setting  ? ? ? ?Data Reviewed: I have personally reviewed following labs and imaging studies ? ?CBC: ?Recent Labs  ?Lab 08/22/21 ?1217 08/24/21 ?1705 08/25/21 ?0144 08/25/21 ?2218  ?WBC 6.8 12.1* 9.3 7.8  ?NEUTROABS  --  10.4*  --   --   ?HGB 12.3* 13.8 12.4* 12.3*  ?HCT 39.9 46.3 40.5 40.4  ?MCV 88.7 91.7 89.4 90.6  ?PLT 135* 168 177 215  ? ?Basic Metabolic Panel: ?Recent Labs  ?Lab 08/22/21 ?1217 08/24/21 ?1705 08/25/21 ?0144 08/25/21 ?2218  ?NA 134* 136 134* 137  ?K  4.3 3.9 4.1 4.3  ?CL 95* 97* 99 102  ?CO2 24 22 22  19*  ?GLUCOSE 311* 234* 266* 192*  ?BUN 43* 35* 40* 50*  ?CREATININE 13.95* 13.57* 14.52* 16.15*  ?CALCIUM 9.0 9.4 8.9 8.8*  ?MG 1.9 2.1  --  2.2  ? ?GFR: ?Estimated Creatinine Clearance: 7.9 mL/min (A) (by C-G formula based on SCr of 16.15 mg/dL (H)). ?Liver Function Tests: ?Recent Labs  ?Lab 08/24/21 ?1705 08/25/21 ?2218  ?AST 15 15  ?ALT 9 9  ?ALKPHOS 77 74  ?BILITOT 0.5 0.4  ?PROT 8.6* 7.7  ?ALBUMIN 3.7 3.3*  ? ?No results for input(s): LIPASE, AMYLASE in the last 168 hours. ?No results for input(s): AMMONIA in the last 168 hours. ?Coagulation Profile: ?No results for input(s): INR, PROTIME in the last 168 hours. ?Cardiac Enzymes: ?No results for input(s): CKTOTAL, CKMB, CKMBINDEX, TROPONINI in the last 168 hours. ?BNP (last 3 results) ?No results for input(s): PROBNP in the last 8760 hours. ?HbA1C: ?Recent Labs  ?  08/24/21 ?1705  ?HGBA1C 10.0*  ? ?CBG: ?Recent Labs  ?Lab 08/25/21 ?1211 08/25/21 ?1603 08/25/21 ?2137 08/26/21 ?0752 08/26/21 ?1411  ?GLUCAP 394* 290* 165* 322* 297*  ? ?Lipid Profile: ?No results for input(s): CHOL, HDL, LDLCALC, TRIG, CHOLHDL, LDLDIRECT in the last 72 hours. ?Thyroid Function Tests: ?No results for input(s): TSH, T4TOTAL, FREET4, T3FREE, THYROIDAB in the last 72 hours. ?Anemia Panel: ?No results for input(s): VITAMINB12, FOLATE, FERRITIN, TIBC, IRON, RETICCTPCT in the last 72 hours. ?Sepsis Labs: ?Recent Labs  ?Lab 08/25/21 ?2218 08/26/21 ?0022  ?LATICACIDVEN 2.2* 1.5  ? ? ?Recent Results (from the past 240 hour(s))  ?Resp Panel by RT-PCR (Flu A&B, Covid) Nasopharyngeal Swab     Status: None  ? Collection Time: 08/22/21 12:00 PM  ? Specimen: Nasopharyngeal Swab; Nasopharyngeal(NP) swabs in vial transport medium  ?Result Value Ref Range Status  ? SARS Coronavirus 2 by RT PCR NEGATIVE NEGATIVE Final  ?  Comment: (NOTE) ?SARS-CoV-2 target nucleic acids are NOT DETECTED. ? ?The SARS-CoV-2 RNA is generally detectable in upper  respiratory ?specimens during the acute phase of infection. The lowest ?concentration of SARS-CoV-2 viral copies this assay can detect is ?138 copies/mL. A negative result does not preclude SARS-Cov-2 ?infection and should not be used as the sole basis for treatment or ?other patient management decisions. A negative result may occur with  ?improper specimen collection/handling, submission of specimen other ?than nasopharyngeal swab, presence of viral mutation(s) within the ?areas targeted by this assay, and inadequate number  of viral ?copies(<138 copies/mL). A negative result must be combined with ?clinical observations, patient history, and epidemiological ?information. The expected result is Negative. ? ?Fact Sheet for Patients:  ?EntrepreneurPulse.com.au ? ?Fact Sheet for Healthcare Providers:  ?IncredibleEmployment.be ? ?This test is no t yet approved or cleared by the [place] FDA and  ?has been authorized for detection and/or diagnosis of SARS-CoV-2 by ?FDA under an Emergency Use Authorization (EUA). This EUA will remain  ?in effect (meaning this test can be used) for the duration of the ?COVID-19 declaration under Section 564(b)(1) of the Act, 21 ?U.S.C.section 360bbb-3(b)(1), unless the authorization is terminated  ?or revoked sooner.  ? ? ?  ? Influenza A by PCR NEGATIVE NEGATIVE Final  ? Influenza B by PCR NEGATIVE NEGATIVE Final  ?  Comment: (NOTE) ?The Xpert Xpress SARS-CoV-2/FLU/RSV plus assay is intended as an aid ?in the diagnosis of influenza from Nasopharyngeal swab specimens and ?should not be used as a sole basis for treatment. Nasal washings and ?aspirates are unacceptable for Xpert Xpress SARS-CoV-2/FLU/RSV ?testing. ? ?Fact Sheet for Patients: ?EntrepreneurPulse.com.au ? ?Fact Sheet for Healthcare Providers: ?IncredibleEmployment.be ? ?This test is not yet approved or cleared by the [place] FDA and ?has been  authorized for detection and/or diagnosis of SARS-CoV-2 by ?FDA under an Emergency Use Authorization (EUA). This EUA will remain ?in effect (meaning this test can be used) for the duration of the ?COVID-19 declaration un

## 2021-08-26 NOTE — Consult Note (Signed)
[name] [name] is a 46 y.o. male  [id number]  Primary Cardiologist: Neoma Laming, MD Reason for Consultation: syncopal episodes  HPI: [name] [name] is an 46 y.o. male with HTN, poorly controlled DM 2, ESRD on HD MWF, CAD was recently seen in the ED 2 days ago with complaints of malaise, decreased p.o. intake with nausea and dizziness.  He was noted to be orthostatic at the time and was given gentle hydration and discharged home.  Patient states that he was doing okay although was feeling rather weak and continued to be nauseated until 4 AM today when he got up to do his laundry.  He states he is felt very dizzy and then "I hit the floor".  Patient called EMS who noted that he had elevated blood sugar but told him he did not need to come to the ED.  Patient's mother also came and made him some bacon and eggs and also did his laundry.  Patient had another episode of syncope on his way back from the bathroom this afternoon.  He notes again that he felt very dizzy and before he could steady himself he "hit the floor".  EMS was called again and this time they brought him to the ED.  Per report, on route to the ED they thought they saw either SVT or VT but they were not sure.  No strip is available.   Review of Systems: At present, denies chest pain, shortness of breath. Reports lightheadedness prior to loosing consciousness.    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 (1700)     Beta Carotene (VITAMIN A) 25000 UNIT capsule Take 25,000 Units by mouth daily.     carvedilol (COREG) 25 MG tablet Take 25 mg by mouth 2 (two) times daily.     cinacalcet (SENSIPAR) 60 MG tablet Take 1 tablet by mouth daily.     HUMULIN 70/30 (70-30) 100 UNIT/ML injection Inject 10-30 Units into the skin See admin instructions. 10 units before breakfast and 30 units before supper ON DIALYSIS DAYS (M-W-F), NO AM INSULIN     hydrOXYzine (ATARAX) 25 MG tablet Take 25 mg by mouth at bedtime.     losartan (COZAAR) 50 MG tablet Take 50 mg by mouth daily. (1700)     omeprazole (PRILOSEC) 40 MG capsule Take 40 mg by mouth daily.     sevelamer carbonate (RENVELA) 2.4 g PACK Take 2.4 g by mouth 3 (three) times daily with meals.     cyclobenzaprine (FLEXERIL) 10 MG tablet Take 5 mg by mouth 2 (two) times daily as needed.     HYDROcodone-acetaminophen (NORCO/VICODIN) 5-325 MG tablet Take 2 tablets by mouth every 6 (six) hours  as needed for moderate pain. (Patient not taking: Reported on 08/25/2021) 20 tablet 0   Lidocaine HCl 4 % LIQD Apply topically.     loperamide (IMODIUM) 2 MG capsule Take 2 mg by mouth as needed for diarrhea or loose stools (Take 1/2-1 tablet as meeded to prevent diarrhea. Can take up to 1 tablet every 4 hours. Do not exceed 4 tablets in a day.).     vitamin E 1000 UNIT capsule Take 1,000 Units by mouth daily. Not sure of dose; started 4/29        aspirin EC  81 mg Oral q1800   carvedilol  25 mg Oral BID   Chlorhexidine Gluconate Cloth  6 each Topical Daily   cinacalcet  60 mg Oral Q breakfast   heparin  5,000 Units Subcutaneous Q8H   hydrOXYzine  25 mg Oral QHS   insulin aspart  0-6 Units Subcutaneous TID WC   insulin aspart protamine- aspart  10 Units Subcutaneous Q T,Th,S,Su   insulin aspart protamine- aspart  35 Units  Subcutaneous Q supper   losartan  50 mg Oral Daily   pantoprazole  40 mg Oral Daily   pneumococcal 20-valent conjugate vaccine  0.5 mL Intramuscular Tomorrow-1000   sevelamer carbonate  2.4 g Oral TID WC    Infusions:   Allergies  Allergen Reactions   Cephalexin Nausea Only   Ivp Dye [Iodinated Contrast Media] Diarrhea   Lisinopril Other (See Comments)    Pt cannot recall reaction, cough   Other     Social History   Socioeconomic History   Marital status: Single    Spouse name: Not on file   Number of children: Not on file   Years of education: Not on file   Highest education level: Not on file  Occupational History   Not on file  Tobacco Use   Smoking status: Never   Smokeless tobacco: Never  Vaping Use   Vaping Use: Never used  Substance and Sexual Activity   Alcohol use: Not Currently    Comment: occ. shot   Drug use: No   Sexual activity: Not on file  Other Topics Concern   Not on file  Social History Narrative   Lives by himself   Social Determinants of Health   Financial Resource Strain: Not on file  Food Insecurity: Not on file  Transportation Needs: Not on file  Physical Activity: Not on file  Stress: Not on file  Social Connections: Not on file  Intimate Partner Violence: Not on file    Family History  Problem Relation Age of Onset   Diabetes Father     PHYSICAL EXAM: Vitals:   08/26/21 0800 08/26/21 0804  BP: (!) 160/119 (!) 162/130  Pulse: 93 96  Resp:    Temp:    SpO2: 100% 100%    No intake or output data in the 24 hours ending 08/26/21 0853  General:  Well appearing. No respiratory difficulty HEENT: normal Neck: supple. no JVD. Carotids 2+ bilat; no bruits. No lymphadenopathy or thryomegaly appreciated. Cor: PMI nondisplaced. Regular rate & rhythm. No rubs, gallops or murmurs. Lungs: clear Abdomen: soft, nontender, nondistended. No hepatosplenomegaly. No bruits or masses. Good bowel sounds. Extremities: no cyanosis, clubbing,  rash, edema Neuro: alert & oriented x 3, cranial nerves grossly intact. moves all 4 extremities w/o difficulty. Affect pleasant.  ECG: NSR, HR 83 bpm, non-specific ST changes  Results for orders placed or performed during the hospital encounter of 08/24/21 (from the past 24  hour(s))  Glucose, capillary     Status: Abnormal   Collection Time: 08/25/21 12:11 PM  Result Value Ref Range   Glucose-Capillary 394 (H) 70 - 99 mg/dL  Glucose, capillary     Status: Abnormal   Collection Time: 08/25/21  4:03 PM  Result Value Ref Range   Glucose-Capillary 290 (H) 70 - 99 mg/dL  Glucose, capillary     Status: Abnormal   Collection Time: 08/25/21  9:37 PM  Result Value Ref Range   Glucose-Capillary 165 (H) 70 - 99 mg/dL  Blood gas, arterial     Status: Abnormal   Collection Time: 08/25/21 10:04 PM  Result Value Ref Range   O2 Content 4.0 L/min   pH, Arterial 7.37 7.35 - 7.45   pCO2 arterial 33 32 - 48 mmHg   pO2, Arterial 111 (H) 83 - 108 mmHg   Bicarbonate 19.1 (L) 20.0 - 28.0 mmol/L   Acid-base deficit 5.3 (H) 0.0 - 2.0 mmol/L   O2 Saturation 99.5 %   Patient temperature 37.0    Collection site RIGHT RADIAL    Drawn by ARTERIAL DRAW    Allens test (pass/fail) PASS PASS  CBC     Status: Abnormal   Collection Time: 08/25/21 10:18 PM  Result Value Ref Range   WBC 7.8 4.0 - 10.5 K/uL   RBC 4.46 4.22 - 5.81 MIL/uL   Hemoglobin 12.3 (L) 13.0 - 17.0 g/dL   HCT 40.4 39.0 - 52.0 %   MCV 90.6 80.0 - 100.0 fL   MCH 27.6 26.0 - 34.0 pg   MCHC 30.4 30.0 - 36.0 g/dL   RDW 15.8 (H) 11.5 - 15.5 %   Platelets 215 150 - 400 K/uL   nRBC 0.0 0.0 - 0.2 %  Comprehensive metabolic panel     Status: Abnormal   Collection Time: 08/25/21 10:18 PM  Result Value Ref Range   Sodium 137 135 - 145 mmol/L   Potassium 4.3 3.5 - 5.1 mmol/L   Chloride 102 98 - 111 mmol/L   CO2 19 (L) 22 - 32 mmol/L   Glucose, Bld 192 (H) 70 - 99 mg/dL   BUN 50 (H) 6 - 20 mg/dL   Creatinine, Ser 16.15 (H) 0.61 - 1.24 mg/dL    Calcium 8.8 (L) 8.9 - 10.3 mg/dL   Total Protein 7.7 6.5 - 8.1 g/dL   Albumin 3.3 (L) 3.5 - 5.0 g/dL   AST 15 15 - 41 U/L   ALT 9 0 - 44 U/L   Alkaline Phosphatase 74 38 - 126 U/L   Total Bilirubin 0.4 0.3 - 1.2 mg/dL   GFR, Estimated 3 (L) >60 mL/min   Anion gap 16 (H) 5 - 15  Lactic acid, plasma     Status: Abnormal   Collection Time: 08/25/21 10:18 PM  Result Value Ref Range   Lactic Acid, Venous 2.2 (HH) 0.5 - 1.9 mmol/L  Magnesium     Status: None   Collection Time: 08/25/21 10:18 PM  Result Value Ref Range   Magnesium 2.2 1.7 - 2.4 mg/dL  Troponin I (High Sensitivity)     Status: Abnormal   Collection Time: 08/25/21 10:18 PM  Result Value Ref Range   Troponin I (High Sensitivity) 78 (H) <18 ng/L  Lactic acid, plasma     Status: None   Collection Time: 08/26/21 12:22 AM  Result Value Ref Range   Lactic Acid, Venous 1.5 0.5 - 1.9 mmol/L  Glucose, capillary     Status: Abnormal  Collection Time: 08/26/21  7:52 AM  Result Value Ref Range   Glucose-Capillary 322 (H) 70 - 99 mg/dL   CT HEAD WO CONTRAST (5MM)  Result Date: 08/24/2021 CLINICAL DATA:  Head trauma, abnormal mental status, loss of consciousness; history CHF, coronary artery disease, type II diabetes mellitus, end-stage renal disease on dialysis, hypertension EXAM: CT HEAD WITHOUT CONTRAST TECHNIQUE: Contiguous axial images were obtained from the base of the skull through the vertex without intravenous contrast. RADIATION DOSE REDUCTION: This exam was performed according to the departmental dose-optimization program which includes automated exposure control, adjustment of the mA and/or kV according to patient size and/or use of iterative reconstruction technique. COMPARISON:  02/22/2014 FINDINGS: Brain: Normal ventricular morphology. No midline shift or mass effect. Small basal ganglia calcifications. Few additional nonspecific linear calcifications within both hemispheres, suspect vascular. Otherwise normal appearance of  brain parenchyma. No intracranial hemorrhage, mass lesion, or evidence of acute infarction. No extra-axial fluid collections. Vascular: Minimal atherosclerotic calcification of internal carotid arteries at skull base. Skull: Intact Sinuses/Orbits: Clear Other: N/A IMPRESSION: No acute intracranial abnormalities. Electronically Signed   By: Lavonia Dana M.D.   On: 08/24/2021 18:54   DG Chest Port 1 View  Result Date: 08/26/2021 CLINICAL DATA:  Cardiac arrest EXAM: PORTABLE CHEST 1 VIEW COMPARISON:  08/24/2021 FINDINGS: Unchanged position of bilateral dialysis catheters. Left subclavian stent. Normal cardiac and mediastinal contours. No focal pulmonary opacity. Low lung volumes. Bronchovascular crowding or vascular congestion without frank pulmonary edema. No pleural effusion or pneumothorax. No acute osseous abnormality. IMPRESSION: Stable chest. Bronchovascular crowding or vascular congestion without frank pulmonary edema. Electronically Signed   By: Merilyn Baba M.D.   On: 08/26/2021 01:48   DG Chest Portable 1 View  Result Date: 08/24/2021 CLINICAL DATA:  Weakness.  Loss of consciousness. EXAM: PORTABLE CHEST 1 VIEW COMPARISON:  Radiograph 2 days ago FINDINGS: Right-sided and left-sided dialysis catheters are unchanged in position. Left subclavian stent in place. Heart is normal in size. Unchanged mediastinal contours. Lower lung volumes from prior exam with bronchovascular crowding versus vascular congestion. No pulmonary edema. Minor left lung base atelectasis. No pleural fluid or pneumothorax. No acute osseous findings. IMPRESSION: Lower lung volumes with bronchovascular crowding versus vascular congestion. Minor left lung base atelectasis. Electronically Signed   By: Keith Rake M.D.   On: 08/24/2021 18:45     ASSESSMENT AND PLAN: Patient laying comfortably in bed, just finished breakfast. Currently denies chest pain, shortness of breath.   Patient had an episode of unresponsiveness over   night, CPR initiated. Patient responded well, moved to telemetry floor.   Patient alert and oriented this morning. Echo results pending. Will schedule for heart catheterization for this afternoon.    Engineer, drilling FNP-C

## 2021-08-26 NOTE — Progress Notes (Signed)
[facility]  ROUNDING NOTE   Subjective:   [name] [name] is a 46 y.o. male with past medical history of diabetes, CAD, hypertension and ESRD on dialysis. Patient presents to ED via EMS after syncopal episodes. Patient has been admitted for Syncope [R55]  Patient is known to our practice from previous admissions and receives outpatient dialysis treatments at [facility], supervised by [facility] physicians. Last dialysis treatment received on Friday, no complications. Patient reports he awoke this morning to do laundry and became dizzy and fell when standing from bed. Chart review shows that EMS was called at that time and patient was found to have elevated blood pressure.  Patient refused transport to ED.  EMS called again once patient became dizzy and fell while going to the bathroom, patient's mother present at that time.  Patient presented to the emergency department noting he felt weak and tired, hungry and thirsty.  Denies sick contacts.  Denies nausea and vomiting.  States he has had poor appetite recently and due to this, he has not maintained his prescribed medications.  We have been consulted to manage dialysis needs during this admission.  Objective:  Vital signs in last 24 hours:  Temp:  [97.5 F (36.4 C)-98.6 F (37 C)] 98.5 F (36.9 C) (05/01 0928) Pulse Rate:  [57-96] 87 (05/01 1115) Resp:  [12-23] 12 (05/01 1115) BP: (106-162)/(58-130) 107/85 (05/01 1115) SpO2:  [88 %-100 %] 97 % (05/01 1015) Weight:  [127.8 kg] 127.8 kg (05/01 0928)  Weight change:  Filed Weights   08/24/21 1625 08/24/21 2103 08/26/21 0928  Weight: 128 kg 126.8 kg 127.8 kg    Intake/Output: I/O last 3 completed shifts: In: 977.2 [I.V.:977.2] Out: -    Intake/Output this shift:  No intake/output data recorded.  Physical Exam: General: NAD, resting quietly  Head: Normocephalic, atraumatic. Moist oral mucosal membranes  Eyes: Anicteric  Lungs:  Diminished in bases, normal effort,  [place] O2  Heart: Regular rate and rhythm  Abdomen:  Soft, nontender  Extremities: No peripheral edema.  Neurologic: Nonfocal, moving all four extremities  Skin: Generalized ecchymosis  Access: Left aVF    Basic Metabolic Panel: Recent Labs  Lab 08/22/21 1217 08/24/21 1705 08/25/21 0144 08/25/21 2218  NA 134* 136 134* 137  K 4.3 3.9 4.1 4.3  CL 95* 97* 99 102  CO2 24 22 22  19*  GLUCOSE 311* 234* 266* 192*  BUN 43* 35* 40* 50*  CREATININE 13.95* 13.57* 14.52* 16.15*  CALCIUM 9.0 9.4 8.9 8.8*  MG 1.9 2.1  --  2.2    Liver Function Tests: Recent Labs  Lab 08/24/21 1705 08/25/21 2218  AST 15 15  ALT 9 9  ALKPHOS 77 74  BILITOT 0.5 0.4  PROT 8.6* 7.7  ALBUMIN 3.7 3.3*   No results for input(s): LIPASE, AMYLASE in the last 168 hours. No results for input(s): AMMONIA in the last 168 hours.  CBC: Recent Labs  Lab 08/22/21 1217 08/24/21 1705 08/25/21 0144 08/25/21 2218  WBC 6.8 12.1* 9.3 7.8  NEUTROABS  --  10.4*  --   --   HGB 12.3* 13.8 12.4* 12.3*  HCT 39.9 46.3 40.5 40.4  MCV 88.7 91.7 89.4 90.6  PLT 135* 168 177 215    Cardiac Enzymes: No results for input(s): CKTOTAL, CKMB, CKMBINDEX, TROPONINI in the last 168 hours.  BNP: Invalid input(s): POCBNP  CBG: Recent Labs  Lab 08/25/21 0755 08/25/21 1211 08/25/21 1603 08/25/21 2137 08/26/21 0752  GLUCAP 233* 394* 290*  165* 322*    Microbiology: Results for orders placed or performed during the hospital encounter of 08/24/21  Resp Panel by RT-PCR (Flu A&B, Covid) Nasopharyngeal Swab     Status: None   Collection Time: 08/24/21  6:22 PM   Specimen: Nasopharyngeal Swab; Nasopharyngeal(NP) swabs in vial transport medium  Result Value Ref Range Status   SARS Coronavirus 2 by RT PCR NEGATIVE NEGATIVE Final    Comment: (NOTE) SARS-CoV-2 target nucleic acids are NOT DETECTED.  The SARS-CoV-2 RNA is generally detectable in upper respiratory specimens during the acute phase of infection. The  lowest concentration of SARS-CoV-2 viral copies this assay can detect is 138 copies/mL. A negative result does not preclude SARS-Cov-2 infection and should not be used as the sole basis for treatment or other patient management decisions. A negative result may occur with  improper specimen collection/handling, submission of specimen other than nasopharyngeal swab, presence of viral mutation(s) within the areas targeted by this assay, and inadequate number of viral copies(<138 copies/mL). A negative result must be combined with clinical observations, patient history, and epidemiological information. The expected result is Negative.  Fact Sheet for Patients:  BloggerCourse.com  Fact Sheet for Healthcare Providers:  SeriousBroker.it  This test is no t yet approved or cleared by the [place] FDA and  has been authorized for detection and/or diagnosis of SARS-CoV-2 by FDA under an Emergency Use Authorization (EUA). This EUA will remain  in effect (meaning this test can be used) for the duration of the COVID-19 declaration under Section 564(b)(1) of the Act, 21 U.S.C.section 360bbb-3(b)(1), unless the authorization is terminated  or revoked sooner.       Influenza A by PCR NEGATIVE NEGATIVE Final   Influenza B by PCR NEGATIVE NEGATIVE Final    Comment: (NOTE) The Xpert Xpress SARS-CoV-2/FLU/RSV plus assay is intended as an aid in the diagnosis of influenza from Nasopharyngeal swab specimens and should not be used as a sole basis for treatment. Nasal washings and aspirates are unacceptable for Xpert Xpress SARS-CoV-2/FLU/RSV testing.  Fact Sheet for Patients: BloggerCourse.com  Fact Sheet for Healthcare Providers: SeriousBroker.it  This test is not yet approved or cleared by the [place] FDA and has been authorized for detection and/or diagnosis of SARS-CoV-2 by FDA under  an Emergency Use Authorization (EUA). This EUA will remain in effect (meaning this test can be used) for the duration of the COVID-19 declaration under Section 564(b)(1) of the Act, 21 U.S.C. section 360bbb-3(b)(1), unless the authorization is terminated or revoked.  Performed at [facility], [address] [place], [place] [address]   MRSA Next Gen by PCR, Nasal     Status: None   Collection Time: 08/24/21 10:11 PM   Specimen: Nasal Mucosa; Nasal Swab  Result Value Ref Range Status   MRSA by PCR Next Gen NOT DETECTED NOT DETECTED Final    Comment: (NOTE) The GeneXpert MRSA Assay (FDA approved for NASAL specimens only), is one component of a comprehensive MRSA colonization surveillance program. It is not intended to diagnose MRSA infection nor to guide or monitor treatment for MRSA infections. Test performance is not FDA approved in patients less than 8 years old. Performed at [facility], [address] [place], [place] [address]     Coagulation Studies: No results for input(s): LABPROT, INR in the last 72 hours.  Urinalysis: No results for input(s): COLORURINE, LABSPEC, PHURINE, GLUCOSEU, HGBUR, BILIRUBINUR, KETONESUR, PROTEINUR, UROBILINOGEN, NITRITE, LEUKOCYTESUR in the last 72 hours.  Invalid input(s): APPERANCEUR  Imaging: CT HEAD WO CONTRAST ( )  Result Date: 08/24/2021 CLINICAL DATA:  Head trauma, abnormal mental status, loss of consciousness; history CHF, coronary artery disease, type II diabetes mellitus, end-stage renal disease on dialysis, hypertension EXAM: CT HEAD WITHOUT CONTRAST TECHNIQUE: Contiguous axial images were obtained from the base of the skull through the vertex without intravenous contrast. RADIATION DOSE REDUCTION: This exam was performed according to the departmental dose-optimization program which includes automated exposure control, adjustment of the mA and/or kV according to patient size and/or use of iterative  reconstruction technique. COMPARISON:  02/22/2014 FINDINGS: Brain: Normal ventricular morphology. No midline shift or mass effect. Small basal ganglia calcifications. Few additional nonspecific linear calcifications within both hemispheres, suspect vascular. Otherwise normal appearance of brain parenchyma. No intracranial hemorrhage, mass lesion, or evidence of acute infarction. No extra-axial fluid collections. Vascular: Minimal atherosclerotic calcification of internal carotid arteries at skull base. Skull: Intact Sinuses/Orbits: Clear Other: N/A IMPRESSION: No acute intracranial abnormalities. Electronically Signed   By: Ulyses Southward M.D.   On: 08/24/2021 18:54   DG Chest Port 1 View  Result Date: 08/26/2021 CLINICAL DATA:  Cardiac arrest EXAM: PORTABLE CHEST 1 VIEW COMPARISON:  08/24/2021 FINDINGS: Unchanged position of bilateral dialysis catheters. Left subclavian stent. Normal cardiac and mediastinal contours. No focal pulmonary opacity. Low lung volumes. Bronchovascular crowding or vascular congestion without frank pulmonary edema. No pleural effusion or pneumothorax. No acute osseous abnormality. IMPRESSION: Stable chest. Bronchovascular crowding or vascular congestion without frank pulmonary edema. Electronically Signed   By: Wiliam Ke M.D.   On: 08/26/2021 01:48   DG Chest Portable 1 View  Result Date: 08/24/2021 CLINICAL DATA:  Weakness.  Loss of consciousness. EXAM: PORTABLE CHEST 1 VIEW COMPARISON:  Radiograph 2 days ago FINDINGS: Right-sided and left-sided dialysis catheters are unchanged in position. Left subclavian stent in place. Heart is normal in size. Unchanged mediastinal contours. Lower lung volumes from prior exam with bronchovascular crowding versus vascular congestion. No pulmonary edema. Minor left lung base atelectasis. No pleural fluid or pneumothorax. No acute osseous findings. IMPRESSION: Lower lung volumes with bronchovascular crowding versus vascular congestion. Minor left  lung base atelectasis. Electronically Signed   By: Narda Rutherford M.D.   On: 08/24/2021 18:45     Medications:    sodium chloride     sodium chloride      aspirin EC  81 mg Oral q1800   carvedilol  25 mg Oral BID   Chlorhexidine Gluconate Cloth  6 each Topical Daily   cinacalcet  60 mg Oral Q breakfast   heparin  5,000 Units Subcutaneous Q8H   hydrOXYzine  25 mg Oral QHS   insulin aspart  0-6 Units Subcutaneous TID WC   insulin aspart protamine- aspart  10 Units Subcutaneous Q T,Th,S,Su   insulin aspart protamine- aspart  35 Units Subcutaneous Q supper   losartan  50 mg Oral Daily   pantoprazole  40 mg Oral Daily   pneumococcal 20-valent conjugate vaccine  0.5 mL Intramuscular Tomorrow-1000   sevelamer carbonate  2.4 g Oral TID WC   sodium chloride flush  3 mL Intravenous Q12H   sodium chloride, sodium chloride, acetaminophen **OR** acetaminophen, alteplase, bisacodyl, heparin, HYDROcodone-acetaminophen, lidocaine (PF), lidocaine-prilocaine, loperamide, pentafluoroprop-tetrafluoroeth, polyethylene glycol  Assessment/ Plan:  [name] [name] is a 46 y.o.  male with past medical history of diabetes, CAD, hypertension and ESRD on dialysis. Patient presents to ED via EMS after syncopal episodes. Patient has been admitted for Syncope [R55]  [facility]/MWF/left aVF  End-stage renal disease on hemodialysis.  Will maintain outpatient schedule if possible.  Patient currently receiving dialysis, UF goal 2 L as tolerated.  Next treatment scheduled for Wednesday.  2. Anemia of chronic kidney disease Normocytic Lab Results  Component Value Date   HGB 12.3 (L) 08/25/2021  Hgb is within acceptable range  3. Secondary Hyperparathyroidism:  Lab Results  Component Value Date   CALCIUM 8.8 (L) 08/25/2021   CAION 0.87 (LL) 07/11/2021   PHOS 7.9 (H) 09/24/2020    Will continue to monitor bone minerals during this admission.  Sevelamer ordered with meals  4. Diabetes  mellitus type II with chronic kidney disease  insulin dependent. Home regimen includes Humulin 70/30. Most recent hemoglobin A1c is 10.0 on 08/24/21.   Glucose elevated this morning, 322. Primary team to manage SSI    LOS: 2 Shauntelle Jamerson 5/1/202311:29 AM

## 2021-08-27 ENCOUNTER — Inpatient Hospital Stay (HOSPITAL_COMMUNITY)
Admit: 2021-08-27 | Discharge: 2021-08-27 | Disposition: A | Discharge status: Home / Self Care | Payer: Medicare Other | Attending: Internal Medicine | Admitting: Internal Medicine

## 2021-08-27 ENCOUNTER — Inpatient Hospital Stay
Admit: 2021-08-27 | Discharge: 2021-08-27 | Disposition: A | Discharge status: Home / Self Care | Payer: Medicare Other | Attending: Internal Medicine | Admitting: Internal Medicine

## 2021-08-27 ENCOUNTER — Encounter
Admission: EM | Disposition: A | Discharge status: Home / Self Care | Payer: Self-pay | Source: Home / Self Care | Attending: Internal Medicine

## 2021-08-27 DIAGNOSIS — N186 End stage renal disease: Secondary | ICD-10-CM | POA: Diagnosis not present

## 2021-08-27 DIAGNOSIS — R778 Other specified abnormalities of plasma proteins: Secondary | ICD-10-CM | POA: Diagnosis not present

## 2021-08-27 DIAGNOSIS — R55 Syncope and collapse: Secondary | ICD-10-CM | POA: Diagnosis not present

## 2021-08-27 DIAGNOSIS — I10 Essential (primary) hypertension: Secondary | ICD-10-CM | POA: Diagnosis not present

## 2021-08-27 HISTORY — PX: LEFT HEART CATH AND CORONARY ANGIOGRAPHY: CATH118249

## 2021-08-27 LAB — BASIC METABOLIC PANEL
Anion gap: 16 — ABNORMAL HIGH (ref 5–15)
BUN: 39 mg/dL — ABNORMAL HIGH (ref 6–20)
CO2: 20 mmol/L — ABNORMAL LOW (ref 22–32)
Calcium: 8.6 mg/dL — ABNORMAL LOW (ref 8.9–10.3)
Chloride: 99 mmol/L (ref 98–111)
Creatinine, Ser: 14.54 mg/dL — ABNORMAL HIGH (ref 0.61–1.24)
GFR, Estimated: 4 mL/min — ABNORMAL LOW (ref >60)
Glucose, Bld: 193 mg/dL — ABNORMAL HIGH (ref 70–99)
Potassium: 3.8 mmol/L (ref 3.5–5.1)
Sodium: 135 mmol/L (ref 135–145)

## 2021-08-27 LAB — GLUCOSE, CAPILLARY
Glucose-Capillary: 180 mg/dL — ABNORMAL HIGH (ref 70–99)
Glucose-Capillary: 161 mg/dL — ABNORMAL HIGH (ref 70–99)
Glucose-Capillary: 165 mg/dL — ABNORMAL HIGH (ref 70–99)
Glucose-Capillary: 228 mg/dL — ABNORMAL HIGH (ref 70–99)
Glucose-Capillary: 263 mg/dL — ABNORMAL HIGH (ref 70–99)

## 2021-08-27 LAB — HEPATITIS B SURFACE ANTIBODY, QUANTITATIVE: Hep B S AB Quant (Post): 13.4 m[IU]/mL (ref >9.9)

## 2021-08-27 SURGERY — LEFT HEART CATH AND CORONARY ANGIOGRAPHY
Anesthesia: Moderate Sedation

## 2021-08-27 MED ORDER — MIDAZOLAM HCL 2 MG/2ML IJ SOLN
INTRAMUSCULAR | Status: AC
  Filled 2021-08-27: qty 2

## 2021-08-27 MED ORDER — SODIUM CHLORIDE 0.9 % WEIGHT BASED INFUSION: 3.0000 mL/kg/h | INTRAVENOUS | Status: DC

## 2021-08-27 MED ORDER — HEPARIN (PORCINE) IN NACL 1000-0.9 UT/500ML-% IV SOLN
INTRAVENOUS | Status: DC | PRN
  Administered 2021-08-27: 1000 mL

## 2021-08-27 MED ORDER — SODIUM CHLORIDE 0.9% FLUSH: 3.0000 mL | INTRAVENOUS | Status: DC | PRN

## 2021-08-27 MED ORDER — HEPARIN SODIUM (PORCINE) 1000 UNIT/ML IJ SOLN
INTRAMUSCULAR | Status: AC
  Filled 2021-08-27: qty 10

## 2021-08-27 MED ORDER — MIDAZOLAM HCL 2 MG/2ML IJ SOLN
INTRAMUSCULAR | Status: DC | PRN
  Administered 2021-08-27 (×2): 1 mg via INTRAVENOUS

## 2021-08-27 MED ORDER — PERFLUTREN LIPID MICROSPHERE
1.0000 mL | INTRAVENOUS | Status: DC | PRN
  Administered 2021-08-27: 2 mL via INTRAVENOUS
  Filled 2021-08-27: qty 10

## 2021-08-27 MED ORDER — IOHEXOL 300 MG/ML  SOLN
INTRAMUSCULAR | Status: DC | PRN
  Administered 2021-08-27: 30 mL

## 2021-08-27 MED ORDER — LIDOCAINE HCL 1 % IJ SOLN
INTRAMUSCULAR | Status: AC
Start: 2021-08-27 — End: ?
  Filled 2021-08-27: qty 20

## 2021-08-27 MED ORDER — LIDOCAINE HCL (PF) 1 % IJ SOLN
INTRAMUSCULAR | Status: DC | PRN
  Administered 2021-08-27: 20 mL

## 2021-08-27 MED ORDER — SODIUM CHLORIDE 0.9 % IV SOLN
250.0000 mL | INTRAVENOUS | Status: DC | PRN
  Administered 2021-08-28: 250 mL/h via INTRAVENOUS

## 2021-08-27 MED ORDER — SODIUM CHLORIDE 0.9% FLUSH
3.0000 mL | Freq: Two times a day (BID) | INTRAVENOUS | Status: DC
  Administered 2021-08-27 – 2021-09-01 (×7): 3 mL via INTRAVENOUS

## 2021-08-27 MED ORDER — SODIUM CHLORIDE 0.9 % WEIGHT BASED INFUSION: 1.0000 mL/kg/h | INTRAVENOUS | Status: DC

## 2021-08-27 MED ORDER — FENTANYL CITRATE (PF) 100 MCG/2ML IJ SOLN
INTRAMUSCULAR | Status: DC | PRN
  Administered 2021-08-27: 50 ug via INTRAVENOUS
  Administered 2021-08-27: 25 ug via INTRAVENOUS

## 2021-08-27 MED ORDER — SODIUM CHLORIDE 0.9 % IV SOLN: 250.0000 mL | INTRAVENOUS | Status: DC | PRN

## 2021-08-27 MED ORDER — FENTANYL CITRATE (PF) 100 MCG/2ML IJ SOLN
INTRAMUSCULAR | Status: AC
  Filled 2021-08-27: qty 2

## 2021-08-27 MED ORDER — HEPARIN (PORCINE) IN NACL 1000-0.9 UT/500ML-% IV SOLN
INTRAVENOUS | Status: AC
  Filled 2021-08-27: qty 1000

## 2021-08-27 SURGICAL SUPPLY — 10 items
CATH INFINITI 5FR MULTPACK ANG (CATHETERS) ×1 IMPLANT
DEVICE CLOSURE MYNXGRIP 5F (Vascular Products) IMPLANT
NDL PERC 18GX7CM (NEEDLE) IMPLANT
NEEDLE PERC 18GX7CM (NEEDLE) ×2 IMPLANT
PACK CARDIAC CATH (CUSTOM PROCEDURE TRAY) ×1 IMPLANT
PROTECTION STATION PRESSURIZED (MISCELLANEOUS) ×2
SET ATX SIMPLICITY (MISCELLANEOUS) ×1 IMPLANT
SHEATH AVANTI 5FR X 11CM (SHEATH) ×1 IMPLANT
STATION PROTECTION PRESSURIZED (MISCELLANEOUS) IMPLANT
WIRE GUIDERIGHT .035X150 (WIRE) ×1 IMPLANT

## 2021-08-27 NOTE — Progress Notes (Signed)
?  Hemodialysis Post Treatment Note: ? ?Tx date: 08/27/2021 ? ?Tx time: 2 hours ? ?Access: Right CVC ? ?UF Removed: 852 ml ?  ?Note: Patient completed dialysis treatment as ordered. Toward the end of treatment with 9 minutes left of treatment, patient started complaining of chest pain 8/10. Dr. Candiss Norse notified and ordered to terminate treatment and advise floor nurse to give Norco on patient [facility] for pain management. Patient also verbalized increase in size of right chest where catheter is located. Patient with no s/s of acute distress. Report given to Cyril Loosen, RN. ? ?

## 2021-08-27 NOTE — Progress Notes (Signed)
?[facility]  ?ROUNDING NOTE  ? ?Subjective:  ? ?[name] [name] is a 46 y.o. male with past medical history of diabetes, CAD, hypertension and ESRD on dialysis. Patient presents to ED via EMS after syncopal episodes. Patient has been admitted for Syncope [R55] ? ?Patient is known to our practice from previous admissions and receives outpatient dialysis treatments at [facility], supervised by [facility] physicians. ? ?Patient resting in bed ?Mother at bedside ?Currently NPO for cardiac cath ?Denies pain and discomfort ?Denies shortness of breath ?Patient and mother have questions about cardiac cath, dialysis and discharge plan.  ? ?Objective:  ?Vital signs in last 24 hours:  ?Temp:  [98.1 ?F (36.7 ?C)-99.3 ?F (37.4 ?C)] 99.3 ?F (37.4 ?C) (05/02 0935) ?Pulse Rate:  [70-93] 80 (05/02 1315) ?Resp:  [11-24] 17 (05/02 1315) ?BP: (96-166)/(48-116) 96/69 (05/02 1315) ?SpO2:  [88 %-99 %] 92 % (05/02 1315) ?Weight:  [126.3 kg] 126.3 kg (05/01 1406) ? ?Weight change:  ?Filed Weights  ? 08/26/21 0928 08/26/21 1300 08/26/21 1406  ?Weight: 127.8 kg 125 kg 126.3 kg  ? ? ?Intake/Output: ?I/O last 3 completed shifts: ?In: 480 [P.O.:480] ?Out: 2000 [Other:2000] ?  ?Intake/Output this shift: ? No intake/output data recorded. ? ?Physical Exam: ?General: NAD, resting quietly  ?Head: Normocephalic, atraumatic. Moist oral mucosal membranes  ?Eyes: Anicteric  ?Lungs:  Diminished in bases, normal effort, [place] O2  ?Heart: Regular rate and rhythm  ?Abdomen:  Soft, nontender  ?Extremities: No peripheral edema.  ?Neurologic: Nonfocal, moving all four extremities  ?Skin: Generalized ecchymosis  ?Access: Left aVF  ? ? ?Basic Metabolic Panel: ?Recent Labs  ?Lab 08/22/21 ?1217 08/24/21 ?1705 08/25/21 ?0144 08/25/21 ?2218  ?NA 134* 136 134* 137  ?K 4.3 3.9 4.1 4.3  ?CL 95* 97* 99 102  ?CO2 24 22 22  19*  ?GLUCOSE 311* 234* 266* 192*  ?BUN 43* 35* 40* 50*  ?CREATININE 13.95* 13.57* 14.52* 16.15*  ?CALCIUM 9.0 9.4 8.9 8.8*  ?MG 1.9 2.1   --  2.2  ? ? ? ?Liver Function Tests: ?Recent Labs  ?Lab 08/24/21 ?1705 08/25/21 ?2218  ?AST 15 15  ?ALT 9 9  ?ALKPHOS 77 74  ?BILITOT 0.5 0.4  ?PROT 8.6* 7.7  ?ALBUMIN 3.7 3.3*  ? ? ?No results for input(s): LIPASE, AMYLASE in the last 168 hours. ?No results for input(s): AMMONIA in the last 168 hours. ? ?CBC: ?Recent Labs  ?Lab 08/22/21 ?1217 08/24/21 ?1705 08/25/21 ?0144 08/25/21 ?2218  ?WBC 6.8 12.1* 9.3 7.8  ?NEUTROABS  --  10.4*  --   --   ?HGB 12.3* 13.8 12.4* 12.3*  ?HCT 39.9 46.3 40.5 40.4  ?MCV 88.7 91.7 89.4 90.6  ?PLT 135* 168 177 215  ? ? ? ?Cardiac Enzymes: ?No results for input(s): CKTOTAL, CKMB, CKMBINDEX, TROPONINI in the last 168 hours. ? ?BNP: ?Invalid input(s): POCBNP ? ?CBG: ?Recent Labs  ?Lab 08/26/21 ?1411 08/26/21 ?1608 08/26/21 ?2026 08/27/21 ?0749 08/27/21 ?0938  ?GLUCAP 297* 322* 120* 180* 161*  ? ? ? ?Microbiology: ?Results for orders placed or performed during the hospital encounter of 08/24/21  ?Resp Panel by RT-PCR (Flu A&B, Covid) Nasopharyngeal Swab     Status: None  ? Collection Time: 08/24/21  6:22 PM  ? Specimen: Nasopharyngeal Swab; Nasopharyngeal(NP) swabs in vial transport medium  ?Result Value Ref Range Status  ? SARS Coronavirus 2 by RT PCR NEGATIVE NEGATIVE Final  ?  Comment: (NOTE) ?SARS-CoV-2 target nucleic acids are NOT DETECTED. ? ?The SARS-CoV-2 RNA is generally detectable in  upper respiratory ?specimens during the acute phase of infection. The lowest ?concentration of SARS-CoV-2 viral copies this assay can detect is ?138 copies/mL. A negative result does not preclude SARS-Cov-2 ?infection and should not be used as the sole basis for treatment or ?other patient management decisions. A negative result may occur with  ?improper specimen collection/handling, submission of specimen other ?than nasopharyngeal swab, presence of viral mutation(s) within the ?areas targeted by this assay, and inadequate number of viral ?copies(<138 copies/mL). A negative result must be combined  with ?clinical observations, patient history, and epidemiological ?information. The expected result is Negative. ? ?Fact Sheet for Patients:  ?EntrepreneurPulse.com.au ? ?Fact Sheet for Healthcare Providers:  ?IncredibleEmployment.be ? ?This test is no t yet approved or cleared by the [place] FDA and  ?has been authorized for detection and/or diagnosis of SARS-CoV-2 by ?FDA under an Emergency Use Authorization (EUA). This EUA will remain  ?in effect (meaning this test can be used) for the duration of the ?COVID-19 declaration under Section 564(b)(1) of the Act, 21 ?U.S.C.section 360bbb-3(b)(1), unless the authorization is terminated  ?or revoked sooner.  ? ? ?  ? Influenza A by PCR NEGATIVE NEGATIVE Final  ? Influenza B by PCR NEGATIVE NEGATIVE Final  ?  Comment: (NOTE) ?The Xpert Xpress SARS-CoV-2/FLU/RSV plus assay is intended as an aid ?in the diagnosis of influenza from Nasopharyngeal swab specimens and ?should not be used as a sole basis for treatment. Nasal washings and ?aspirates are unacceptable for Xpert Xpress SARS-CoV-2/FLU/RSV ?testing. ? ?Fact Sheet for Patients: ?EntrepreneurPulse.com.au ? ?Fact Sheet for Healthcare Providers: ?IncredibleEmployment.be ? ?This test is not yet approved or cleared by the [place] FDA and ?has been authorized for detection and/or diagnosis of SARS-CoV-2 by ?FDA under an Emergency Use Authorization (EUA). This EUA will remain ?in effect (meaning this test can be used) for the duration of the ?COVID-19 declaration under Section 564(b)(1) of the Act, 21 U.S.C. ?section 360bbb-3(b)(1), unless the authorization is terminated or ?revoked. ? ?Performed at [facility], [place], ?[place] [address] ?  ?MRSA Next Gen by PCR, Nasal     Status: None  ? Collection Time: 08/24/21 10:11 PM  ? Specimen: Nasal Mucosa; Nasal Swab  ?Result Value Ref Range Status  ? MRSA by PCR Next Gen  NOT DETECTED NOT DETECTED Final  ?  Comment: (NOTE) ?The GeneXpert MRSA Assay (FDA approved for NASAL specimens only), ?is one component of a comprehensive MRSA colonization surveillance ?program. It is not intended to diagnose MRSA infection nor to guide ?or monitor treatment for MRSA infections. ?Test performance is not FDA approved in patients less than 2 years ?old. ?Performed at [facility], [place], ?[place] [address] ?  ? ? ?Coagulation Studies: ?No results for input(s): LABPROT, INR in the last 72 hours. ? ?Urinalysis: ?No results for input(s): COLORURINE, LABSPEC, [place], GLUCOSEU, HGBUR, BILIRUBINUR, KETONESUR, PROTEINUR, UROBILINOGEN, NITRITE, LEUKOCYTESUR in the last 72 hours. ? ?Invalid input(s): APPERANCEUR  ? ? ?Imaging: ?CARDIAC CATHETERIZATION ? ?Result Date: 08/27/2021 ?  LV end diastolic pressure is normal. Normal coronaries with right dominance LVEDP was normal 12 and LV gram was deferred.  Coronary artery disease has been ruled out as the cause of syncope.  ? ?DG Chest Port 1 View ? ?Result Date: 08/26/2021 ?CLINICAL DATA:  Cardiac arrest EXAM: PORTABLE CHEST 1 VIEW COMPARISON:  08/24/2021 FINDINGS: Unchanged position of bilateral dialysis catheters. Left subclavian stent. Normal cardiac and mediastinal contours. No focal pulmonary opacity. Low lung volumes.  Bronchovascular crowding or vascular congestion without frank pulmonary edema. No pleural effusion or pneumothorax. No acute osseous abnormality. IMPRESSION: Stable chest. Bronchovascular crowding or vascular congestion without frank pulmonary edema. Electronically Signed   By: Merilyn Baba M.D.   On: 08/26/2021 01:48   ? ? ?Medications:  ? ? sodium chloride    ? [START ON 08/28/2021] sodium chloride    ? Followed by  ? [START ON 08/28/2021] sodium chloride    ? ? [MAR Hold] aspirin EC  81 mg Oral q1800  ? [MAR Hold] carvedilol  25 mg Oral BID  ? [MAR Hold] Chlorhexidine Gluconate Cloth  6 each Topical Daily  ? [MAR  Hold] cinacalcet  60 mg Oral Q breakfast  ? [MAR Hold] heparin  5,000 Units Subcutaneous Q8H  ? [MAR Hold] hydrOXYzine  25 mg Oral QHS  ? [MAR Hold] insulin aspart  0-6 Units Subcutaneous TID WC  ? [MAR H

## 2021-08-27 NOTE — Progress Notes (Signed)
SUBJECTIVE: No chest pain ? ? ?Vitals:  ? 08/27/21 0020 08/27/21 2458 08/27/21 0747 08/27/21 0935  ?BP: 125/86 127/88 117/73 103/85  ?Pulse: 88  81 82  ?Resp: 18 16 19 18   ?Temp: 98.4 ?F (36.9 ?C) 98.2 ?F (36.8 ?C) 98.1 ?F (36.7 ?C) 99.3 ?F (37.4 ?C)  ?TempSrc:    Oral  ?SpO2: 99%  97% 95%  ?Weight:      ?Height:      ? ? ?Intake/Output Summary (Last 24 hours) at 08/27/2021 1216 ?Last data filed at 08/26/2021 1815 ?Gross per 24 hour  ?Intake 480 ml  ?Output 2000 ml  ?Net -1520 ml  ? ? ?LABS: ?Basic Metabolic Panel: ?Recent Labs  ?  08/24/21 ?1705 08/25/21 ?0144 08/25/21 ?2218  ?NA 136 134* 137  ?K 3.9 4.1 4.3  ?CL 97* 99 102  ?CO2 22 22 19*  ?GLUCOSE 234* 266* 192*  ?BUN 35* 40* 50*  ?CREATININE 13.57* 14.52* 16.15*  ?CALCIUM 9.4 8.9 8.8*  ?MG 2.1  --  2.2  ? ?Liver Function Tests: ?Recent Labs  ?  08/24/21 ?1705 08/25/21 ?2218  ?AST 15 15  ?ALT 9 9  ?ALKPHOS 77 74  ?BILITOT 0.5 0.4  ?PROT 8.6* 7.7  ?ALBUMIN 3.7 3.3*  ? ?No results for input(s): LIPASE, AMYLASE in the last 72 hours. ?CBC: ?Recent Labs  ?  08/24/21 ?1705 08/25/21 ?0144 08/25/21 ?2218  ?WBC 12.1* 9.3 7.8  ?NEUTROABS 10.4*  --   --   ?HGB 13.8 12.4* 12.3*  ?HCT 46.3 40.5 40.4  ?MCV 91.7 89.4 90.6  ?PLT 168 177 215  ? ?Cardiac Enzymes: ?No results for input(s): CKTOTAL, CKMB, CKMBINDEX, TROPONINI in the last 72 hours. ?BNP: ?Invalid input(s): POCBNP ?D-Dimer: ?No results for input(s): DDIMER in the last 72 hours. ?Hemoglobin A1C: ?Recent Labs  ?  08/24/21 ?1705  ?HGBA1C 10.0*  ? ?Fasting Lipid Panel: ?No results for input(s): CHOL, HDL, LDLCALC, TRIG, CHOLHDL, LDLDIRECT in the last 72 hours. ?Thyroid Function Tests: ?No results for input(s): TSH, T4TOTAL, T3FREE, THYROIDAB in the last 72 hours. ? ?Invalid input(s): FREET3 ?Anemia Panel: ?No results for input(s): VITAMINB12, FOLATE, FERRITIN, TIBC, IRON, RETICCTPCT in the last 72 hours. ? ? ?PHYSICAL EXAM ?General: Well developed, well nourished, in no acute distress ?HEENT:  Normocephalic and  atramatic ?Neck:  No JVD.  ?Lungs: Clear bilaterally to auscultation and percussion. ?Heart: HRRR . Normal S1 and S2 without gallops or murmurs.  ?Abdomen: Bowel sounds are positive, abdomen soft and non-tender  ?Msk:  Back normal, normal gait. Normal strength and tone for age. ?Extremities: No clubbing, cyanosis or edema.   ?Neuro: Alert and oriented X 3. ?Psych:  Good affect, responds appropriately ? ?TELEMETRY: Sinus rhythm ? ?ASSESSMENT AND PLAN: Normal coronaries with right dominant system.  Etiology of syncope unclear may be electrolyte abnormalities as patient is feeling much better now and can resume discharged with follow-up in the office this Friday at 9 AM.  We will do Holter monitor oral for 2 to 4 weeks to further evaluate as an outpatient. ? ?Principal Problem: ?  Syncope ?Active Problems: ?  ESRD on hemodialysis ([place]) - M, W, F via LUE AV fistula ?  Hypertension ?  Elevated troponin ?  Poorly controlled type 2 diabetes mellitus ([place]) ?  Leukocytosis ?  ? ?Dionisio David, MD, FACC ?08/27/2021 ?12:16 PM ? ? ? ?  ?

## 2021-08-27 NOTE — Progress Notes (Signed)
?PROGRESS NOTE ? ? ? ?[name] [name]  RJJ:[id number] DOB: [birth year] DOA: 08/24/2021 ?PCP: Danelle Berry, NP  ? ? ?Brief Narrative:  ?[name] [name] is an 46 y.o. male with HTN, poorly controlled DM 2, ESRD on HD MWF, CAD was recently seen in the ED 2 days ago with complaints of malaise, decreased p.o. intake with nausea and dizziness.  He was noted to be orthostatic at the time and was given gentle hydration and discharged home.  Patient states that he was doing okay although was feeling rather weak and continued to be nauseated until 4 AM today when he got up to do his laundry.  He states he is felt very dizzy and then "I hit the floor".  Patient called EMS who noted that he had elevated blood sugar but told him he did not need to come to the ED.  Patient's mother also came and made him some bacon and eggs and also did his laundry.  Patient had another episode of syncope on his way back from the bathroom this afternoon.  He notes again that he felt very dizzy and before he could steady himself he "hit the floor".  EMS was called again and this time they brought him to the ED.  Per report, on route to the ED they thought they saw either SVT or VT but they were not sure.  No strip is available. ?  ?At present, patient states that he is weak and tired.  He notes he is hungry and thirsty.  Denies dizziness while he is sitting in bed.  Thinks he might get dizzy if he tried to get up.  Patient denies any chest pain or palpitations.  Denies any shortness of breath or DOE.  Just notes that he feels weak.  Patient notes he has been keeping up with his dialysis on Monday Wednesday and Friday, was last dialyzed yesterday.  Patient also notes he has not been taking his medications, neither his blood pressure nor his insulin because he was not eating very much and he was feeling dizzy. ?  ?  ? ?5/1 this am patient was found unresponsive after using the restroom.  Initiated compression and [facility] was called.  He  achieved ROSC and was initiating breaths independently.  He responded after 6 compressions.  Cardiology was made aware this a.m. by me. Currently in HD without any complaints.  ? ?5/2 s/p cath today. Plan for HD today. Pt c/o his fistula not working if they plan to check this out. Vascular consulted. ? ?Consultants:  ?Nephrology, cardiology, vascular  ? ?Procedures:  ?Hemodialysis ? ? ?Antimicrobials:  ?  ? ? ?Subjective: ?No sob, cp, dizziness ? ?Objective: ?Vitals:  ? 08/27/21 1300 08/27/21 1315 08/27/21 1330 08/27/21 1402  ?BP: 110/71 96/69 108/70 115/71  ?Pulse: 82 80 79 80  ?Resp: 12 17 14 19   ?Temp:    98.2 ?F (36.8 ?C)  ?TempSrc:      ?SpO2: 92% 92% 90% 92%  ?Weight:      ?Height:      ? ? ?Intake/Output Summary (Last 24 hours) at 08/27/2021 1536 ?Last data filed at 08/26/2021 1815 ?Gross per 24 hour  ?Intake 480 ml  ?Output --  ?Net 480 ml  ? ?Filed Weights  ? 08/26/21 0160 08/26/21 1300 08/26/21 1406  ?Weight: 127.8 kg 125 kg 126.3 kg  ? ? ?Examination: ?Calm, NAD ?Cta no w/r ?Reg s1/s2 no gallop ?Soft benign +bs ?No edema ?Aaoxox3  ?Mood and affect appropriate  in current setting  ? ? ? ?Data Reviewed: I have personally reviewed following labs and imaging studies ? ?CBC: ?Recent Labs  ?Lab 08/22/21 ?1217 08/24/21 ?1705 08/25/21 ?0144 08/25/21 ?2218  ?WBC 6.8 12.1* 9.3 7.8  ?NEUTROABS  --  10.4*  --   --   ?HGB 12.3* 13.8 12.4* 12.3*  ?HCT 39.9 46.3 40.5 40.4  ?MCV 88.7 91.7 89.4 90.6  ?PLT 135* 168 177 215  ? ?Basic Metabolic Panel: ?Recent Labs  ?Lab 08/22/21 ?1217 08/24/21 ?1705 08/25/21 ?0144 08/25/21 ?2218  ?NA 134* 136 134* 137  ?K 4.3 3.9 4.1 4.3  ?CL 95* 97* 99 102  ?CO2 24 22 22  19*  ?GLUCOSE 311* 234* 266* 192*  ?BUN 43* 35* 40* 50*  ?CREATININE 13.95* 13.57* 14.52* 16.15*  ?CALCIUM 9.0 9.4 8.9 8.8*  ?MG 1.9 2.1  --  2.2  ? ?GFR: ?Estimated Creatinine Clearance: 7.9 mL/min (A) (by C-G formula based on SCr of 16.15 mg/dL (H)). ?Liver Function Tests: ?Recent Labs  ?Lab 08/24/21 ?1705 08/25/21 ?2218  ?AST  15 15  ?ALT 9 9  ?ALKPHOS 77 74  ?BILITOT 0.5 0.4  ?PROT 8.6* 7.7  ?ALBUMIN 3.7 3.3*  ? ?No results for input(s): LIPASE, AMYLASE in the last 168 hours. ?No results for input(s): AMMONIA in the last 168 hours. ?Coagulation Profile: ?No results for input(s): INR, PROTIME in the last 168 hours. ?Cardiac Enzymes: ?No results for input(s): CKTOTAL, CKMB, CKMBINDEX, TROPONINI in the last 168 hours. ?BNP (last 3 results) ?No results for input(s): PROBNP in the last 8760 hours. ?HbA1C: ?Recent Labs  ?  08/24/21 ?1705  ?HGBA1C 10.0*  ? ?CBG: ?Recent Labs  ?Lab 08/26/21 ?1608 08/26/21 ?2026 08/27/21 ?0749 08/27/21 ?3419 08/27/21 ?1424  ?GLUCAP 322* 120* 180* 161* 165*  ? ?Lipid Profile: ?No results for input(s): CHOL, HDL, LDLCALC, TRIG, CHOLHDL, LDLDIRECT in the last 72 hours. ?Thyroid Function Tests: ?No results for input(s): TSH, T4TOTAL, FREET4, T3FREE, THYROIDAB in the last 72 hours. ?Anemia Panel: ?No results for input(s): VITAMINB12, FOLATE, FERRITIN, TIBC, IRON, RETICCTPCT in the last 72 hours. ?Sepsis Labs: ?Recent Labs  ?Lab 08/25/21 ?2218 08/26/21 ?0022  ?LATICACIDVEN 2.2* 1.5  ? ? ?Recent Results (from the past 240 hour(s))  ?Resp Panel by RT-PCR (Flu A&B, Covid) Nasopharyngeal Swab     Status: None  ? Collection Time: 08/22/21 12:00 PM  ? Specimen: Nasopharyngeal Swab; Nasopharyngeal(NP) swabs in vial transport medium  ?Result Value Ref Range Status  ? SARS Coronavirus 2 by RT PCR NEGATIVE NEGATIVE Final  ?  Comment: (NOTE) ?SARS-CoV-2 target nucleic acids are NOT DETECTED. ? ?The SARS-CoV-2 RNA is generally detectable in upper respiratory ?specimens during the acute phase of infection. The lowest ?concentration of SARS-CoV-2 viral copies this assay can detect is ?138 copies/mL. A negative result does not preclude SARS-Cov-2 ?infection and should not be used as the sole basis for treatment or ?other patient management decisions. A negative result may occur with  ?improper specimen collection/handling, submission  of specimen other ?than nasopharyngeal swab, presence of viral mutation(s) within the ?areas targeted by this assay, and inadequate number of viral ?copies(<138 copies/mL). A negative result must be combined with ?clinical observations, patient history, and epidemiological ?information. The expected result is Negative. ? ?Fact Sheet for Patients:  ?EntrepreneurPulse.com.au ? ?Fact Sheet for Healthcare Providers:  ?IncredibleEmployment.be ? ?This test is no t yet approved or cleared by the [place] FDA and  ?has been authorized for detection and/or diagnosis of SARS-CoV-2 by ?FDA under an Emergency Use Authorization (  EUA). This EUA will remain  ?in effect (meaning this test can be used) for the duration of the ?COVID-19 declaration under Section 564(b)(1) of the Act, 21 ?U.S.C.section 360bbb-3(b)(1), unless the authorization is terminated  ?or revoked sooner.  ? ? ?  ? Influenza A by PCR NEGATIVE NEGATIVE Final  ? Influenza B by PCR NEGATIVE NEGATIVE Final  ?  Comment: (NOTE) ?The Xpert Xpress SARS-CoV-2/FLU/RSV plus assay is intended as an aid ?in the diagnosis of influenza from Nasopharyngeal swab specimens and ?should not be used as a sole basis for treatment. Nasal washings and ?aspirates are unacceptable for Xpert Xpress SARS-CoV-2/FLU/RSV ?testing. ? ?Fact Sheet for Patients: ?EntrepreneurPulse.com.au ? ?Fact Sheet for Healthcare Providers: ?IncredibleEmployment.be ? ?This test is not yet approved or cleared by the [place] FDA and ?has been authorized for detection and/or diagnosis of SARS-CoV-2 by ?FDA under an Emergency Use Authorization (EUA). This EUA will remain ?in effect (meaning this test can be used) for the duration of the ?COVID-19 declaration under Section 564(b)(1) of the Act, 21 U.S.C. ?section 360bbb-3(b)(1), unless the authorization is terminated or ?revoked. ? ?Performed at [facility], [place], ?[place] [address] ?  ?Resp Panel by RT-PCR (Flu A&B, Covid) Nasopharyngeal Swab     Status: None  ? Collection Time: 08/24/21  6:22 PM  ? Specimen: Nasopharyngeal Swab; Nasopharyngeal(NP) swabs in

## 2021-08-28 ENCOUNTER — Encounter: Payer: Self-pay | Admitting: Cardiovascular Disease

## 2021-08-28 ENCOUNTER — Inpatient Hospital Stay: Payer: Medicare Other

## 2021-08-28 DIAGNOSIS — N186 End stage renal disease: Secondary | ICD-10-CM | POA: Diagnosis not present

## 2021-08-28 DIAGNOSIS — R55 Syncope and collapse: Secondary | ICD-10-CM | POA: Diagnosis not present

## 2021-08-28 DIAGNOSIS — Z992 Dependence on renal dialysis: Secondary | ICD-10-CM | POA: Diagnosis not present

## 2021-08-28 DIAGNOSIS — R778 Other specified abnormalities of plasma proteins: Secondary | ICD-10-CM | POA: Diagnosis not present

## 2021-08-28 LAB — ECHOCARDIOGRAM COMPLETE
Area-P 1/2: 4.15 cm2
Height: 73 in
S' Lateral: 2.8 cm
Weight: 4465.64 oz

## 2021-08-28 LAB — GLUCOSE, CAPILLARY
Glucose-Capillary: 198 mg/dL — ABNORMAL HIGH (ref 70–99)
Glucose-Capillary: 142 mg/dL — ABNORMAL HIGH (ref 70–99)
Glucose-Capillary: 224 mg/dL — ABNORMAL HIGH (ref 70–99)
Glucose-Capillary: 362 mg/dL — ABNORMAL HIGH (ref 70–99)

## 2021-08-28 MED ORDER — SODIUM CHLORIDE 0.9 % IV BOLUS
250.0000 mL | Freq: Once | INTRAVENOUS | Status: DC
Start: 2021-08-28 — End: 2021-09-01

## 2021-08-28 MED ORDER — HEPARIN SODIUM (PORCINE) 1000 UNIT/ML IJ SOLN
INTRAMUSCULAR | Status: AC
  Filled 2021-08-28: qty 10

## 2021-08-28 NOTE — TOC Initial Note (Signed)
Transition of Care (TOC) - Initial/Assessment Note  ? ? ?Patient Details  ?Name: [name] [name] ?MRN: [id number] ?Date of Birth: Sep 13, 1975 ? ?Transition of Care (TOC) CM/SW Contact:    ?Laurena Slimmer, RN ?Phone Number: ?08/28/2021, 4:05 PM ? ?Clinical Narrative:                 ? ?Transition of Care (TOC) Screening Note ? ? ?Patient Details  ?Name: [name] [name] ?Date of Birth: 1975/10/31 ? ? ?Transition of Care (TOC) CM/SW Contact:    ?Laurena Slimmer, RN ?Phone Number: ?08/28/2021, 4:05 PM ? ? ? ?Transition of Care Department [facility]) has reviewed patient and no TOC needs have been identified at this time. We will continue to monitor patient advancement through interdisciplinary progression rounds. If new patient transition needs arise, please place a TOC consult. ?  ? ?  ?  ? ? ?Patient Goals and CMS Choice ?  ?  ?  ? ?Expected Discharge Plan and Services ?  ?  ?  ?  ?  ?                ?  ?  ?  ?  ?  ?  ?  ?  ?  ?  ? ?Prior Living Arrangements/Services ?  ?  ?  ?       ?  ?  ?  ?  ? ?Activities of Daily Living ?Home Assistive Devices/Equipment: None ?ADL Screening (condition at time of admission) ?Patient's cognitive ability adequate to safely complete daily activities?: Yes ?Is the patient deaf or have difficulty hearing?: No ?Does the patient have difficulty seeing, even when wearing glasses/contacts?: Yes ?Does the patient have difficulty concentrating, remembering, or making decisions?: No ?Patient able to express need for assistance with ADLs?: No ?Does the patient have difficulty dressing or bathing?: No ?Independently performs ADLs?: Yes (appropriate for developmental age) ?Does the patient have difficulty walking or climbing stairs?: No ?Weakness of Legs: None ?Weakness of Arms/Hands: None ? ?Permission Sought/Granted ?  ?  ?   ?   ?   ?   ? ?Emotional Assessment ?  ?  ?  ?  ?  ?  ? ?Admission diagnosis:  Syncope [R55] ?Patient Active Problem List  ? Diagnosis Date Noted  ? Elevated troponin 08/25/2021  ?  Poorly controlled type 2 diabetes mellitus ([place]) 08/25/2021  ? Leukocytosis 08/25/2021  ? Syncope 08/24/2021  ? 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 Coronary artery disease   ? Obesity, Class III, BMI 40-49.9 (morbid obesity) ([place])   ? Chronic venous insufficiency 02/17/2017  ? Hypertension 07/24/2016  ? Venous ulcer of left leg ([place]) 07/24/2016  ? Uncontrolled type 2 diabetes mellitus with hyperglycemia, with long-term current use of insulin ([place]) 11/27/2015  ? ESRD on hemodialysis ([place]) - M, W, F via LUE AV fistula 11/02/2014  ? Diabetes mellitus with ESRD (end-stage renal disease) ([place]) 11/02/2014  ? ?PCP:  Danelle Berry, NP ?Pharmacy:   ?[facility] (N), [place] ?[address] ([place]) [place] [address] ?Phone: [phone] Fax: [phone] ? ? ? ? ?Social Determinants of Health (SDOH) Interventions ?  ? ?Readmission Risk Interventions ?   ? View : No data to display.  ?  ?  ?  ? ? ? ?

## 2021-08-28 NOTE — Progress Notes (Signed)
Post Hemodialysis Note  ? ?Patient completes a shorten treatment due to potential clotting, dialyzer given NS flushes. Patient experiences episodes of hypotension, rebounds with NS bolus and UF hold, still removes 1069 ml fluid. Notable patient had shorten treatment on 08/27/21. Catheter without signs of infection, maintained prescribed BFR. Pt continues with trace edema to BLE, voices no concerns, transported to assigned room.  ?

## 2021-08-28 NOTE — H&P (View-Only) (Signed)
? [facility] ?Vascular Consult Note ? ?MRN : [id number] ? ?[name] [name] is a 46 y.o. ([birth year]) male who presents with chief complaint of  ?Chief Complaint  ?Patient presents with  ? Loss of Consciousness  ?. ? ? ?Consulting Physician: Colon Flattery, NP ?Reason for consult: Missing thrill and bruit ?History of Present Illness: [name] [name] is a 46 year old male that is well-known to our practice with a previous medical history of hypertension, diabetes mellitus, end-stage renal disease that recently had a syncopal event and fell on his left upper extremity.  The patient recently had a graft placed in the left upper extremity.  At his most recent office visit it was noted that his wounds are healed and it was available for use with a palpable thrill and bruit.  However, upon admission to the hospital it was noted that he had no thrill or bruit.  Ultrasound today shows complete occlusion of the left upper extremity hero graft. ? ?Current Facility-Administered Medications  ?Medication Dose Route Frequency Provider Last Rate Last Admin  ? 0.9 %  sodium chloride infusion  250 mL Intravenous PRN Neoma Laming A, MD 250 mL/hr at 08/28/21 0448 250 mL/hr at 08/28/21 0448  ? acetaminophen (TYLENOL) tablet 650 mg  650 mg Oral Q6H PRN Vashti Hey, MD      ? Or  ? acetaminophen (TYLENOL) suppository 650 mg  650 mg Rectal Q6H PRN Vashti Hey, MD      ? aspirin EC tablet 81 mg  81 mg Oral q1800 Vashti Hey, MD   81 mg at 08/28/21 1624  ? bisacodyl (DULCOLAX) EC tablet 5 mg  5 mg Oral Daily PRN Bonnell Public Tublu, MD      ? carvedilol (COREG) tablet 25 mg  25 mg Oral BID Bonnell Public Tublu, MD   25 mg at 08/27/21 2259  ? Chlorhexidine Gluconate Cloth 2 % PADS 6 each  6 each Topical Daily Vashti Hey, MD   6 each at 08/28/21 1752  ? cinacalcet (SENSIPAR) tablet 60 mg  60 mg Oral Q breakfast Bonnell Public Tublu, MD   60 mg at  08/28/21 0820  ? heparin injection 5,000 Units  5,000 Units Subcutaneous Q8H Bonnell Public Tublu, MD   5,000 Units at 08/28/21 1304  ? heparin sodium (porcine) 1000 UNIT/ML injection           ? HYDROcodone-acetaminophen (NORCO/VICODIN) 5-325 MG per tablet 2 tablet  2 tablet Oral Q6H PRN Vashti Hey, MD   2 tablet at 08/27/21 2012  ? hydrOXYzine (ATARAX) tablet 25 mg  25 mg Oral QHS Bonnell Public Tublu, MD   25 mg at 08/27/21 2259  ? insulin aspart (novoLOG) injection 0-6 Units  0-6 Units Subcutaneous TID WC Vashti Hey, MD   2 Units at 08/28/21 1625  ? insulin aspart protamine- aspart (NOVOLOG MIX 70/30) injection 10 Units  10 Units Subcutaneous Q T,Th,S,Su Vashti Hey, MD   10 Units at 08/25/21 1229  ? insulin aspart protamine- aspart (NOVOLOG MIX 70/30) injection 35 Units  35 Units Subcutaneous Q supper Nolberto Hanlon, MD   35 Units at 08/27/21 2031  ? loperamide (IMODIUM) capsule 2 mg  2 mg Oral PRN Vashti Hey, MD   2 mg at 08/25/21 1843  ? losartan (COZAAR) tablet 50 mg  50 mg Oral Daily Bonnell Public Tublu, MD   50 mg at 08/27/21 1514  ? pantoprazole (PROTONIX) EC tablet 40 mg  40 mg  Oral Daily Vashti Hey, MD   40 mg at 08/28/21 1304  ? pneumococcal 20-valent conjugate vaccine (PREVNAR 20) injection 0.5 mL  0.5 mL Intramuscular Tomorrow-1000 Jamse Arn, Dewaine Oats Tublu, MD      ? polyethylene glycol (MIRALAX / GLYCOLAX) packet 17 g  17 g Oral Daily PRN Vashti Hey, MD      ? sevelamer carbonate (RENVELA) powder PACK 2.4 g  2.4 g Oral TID WC Bonnell Public Tublu, MD   2.4 g at 08/28/21 1627  ? sodium chloride 0.9 % bolus 250 mL  250 mL Intravenous Once Foust, Katy L, NP      ? sodium chloride flush (NS) 0.9 % injection 3 mL  3 mL Intravenous Q12H Scoggins, Amber, NP   3 mL at 08/27/21 2321  ? sodium chloride flush (NS) 0.9 % injection 3 mL  3 mL Intravenous Q12H Neoma Laming A, MD   3 mL at 08/27/21 2321  ?  sodium chloride flush (NS) 0.9 % injection 3 mL  3 mL Intravenous PRN Dionisio David, MD      ? ? ?Past Medical History:  ?Diagnosis Date  ? 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[place]) 09/23/2020  ? CHF (congestive heart failure) ([place])   ? Coronary artery disease   ? Diabetes mellitus without complication ([place])   ? Dialysis patient [facility])   ? M-W-F  ? GERD (gastroesophageal reflux disease)   ? Hypertension   ? Kyrle's disease   ? Renal disorder   ? on dialysis for 4 years three times a week last was Tuesday  ? Secondary hyperparathyroidism of renal origin [facility])   ? Shortness of breath dyspnea   ? Type 2 diabetes mellitus with diabetic nephropathy ([place])   ? Type 2 diabetes mellitus with diabetic retinopathy ([place])   ? ? ?Past Surgical History:  ?Procedure Laterality Date  ? A/V FISTULAGRAM Left 10/01/2017  ? Procedure: A/V FISTULAGRAM;  Surgeon: Algernon Huxley, MD;  Location: [place] [facility];  Service: Cardiovascular;  Laterality: Left;  ? A/V FISTULAGRAM Left 05/17/2020  ? Procedure: A/V FISTULAGRAM;  Surgeon: Algernon Huxley, MD;  Location: [place] [facility];  Service: Cardiovascular;  Laterality: Left;  ? A/V SHUNTOGRAM Left 04/16/2021  ? Procedure: A/V SHUNTOGRAM;  Surgeon: Katha Cabal, MD;  Location: [place] [facility];  Service: Cardiovascular;  Laterality: Left;  ? CATARACT EXTRACTION W/PHACO Right 06/30/2018  ? Procedure: CATARACT EXTRACTION PHACO AND INTRAOCULAR LENS PLACEMENT (IOC)-RIGHT;  Surgeon: Eulogio Bear, MD;  Location: [facility] ORS;  Service: Ophthalmology;  Laterality: Right;  [place] 00:07.0 ?CDE 0.16 ?FLUID PACK lOT # [id number]  ? CATARACT EXTRACTION W/PHACO Left 12/24/2018  ? Procedure: CATARACT EXTRACTION PHACO AND INTRAOCULAR LENS PLACEMENT (IOC);  Surgeon: Eulogio Bear, MD;  Location: [facility] ORS;  Service: Ophthalmology;  Laterality: Left;  [place] 00:30.0 ?CDE 2.01 ?FLUID PACK LOT # [id number]  ? COLONOSCOPY WITH PROPOFOL N/A 04/23/2017  ? Procedure: COLONOSCOPY WITH PROPOFOL;  Surgeon: [name], MD;   Location: [facility] ENDOSCOPY;  Service: Endoscopy;  Laterality: N/A;  ? COLONOSCOPY WITH PROPOFOL N/A 07/02/2017  ? Procedure: COLONOSCOPY WITH PROPOFOL;  Surgeon: [name], MD;  Location: [facility] ENDOSCOPY;  Service: Endoscopy;  Laterality: N/A;  ? DIALYSIS/PERMA CATHETER INSERTION N/A 09/27/2020  ? Procedure: DIALYSIS/PERMA CATHETER INSERTION;  Surgeon: Algernon Huxley, MD;  Location: [place] CV LAB;  Service: Cardiovascular;  Laterality: N/A;  ? DIALYSIS/PERMA CATHETER REMOVAL N/A 11/01/2020  ? Procedure: DIALYSIS/PERMA CATHETER REMOVAL;  Surgeon: Algernon Huxley,  MD;  Location: [place] [facility];  Service: Cardiovascular;  Laterality: N/A;  ? ESOPHAGOGASTRODUODENOSCOPY (EGD) WITH PROPOFOL N/A 04/23/2017  ? Procedure: ESOPHAGOGASTRODUODENOSCOPY (EGD) WITH PROPOFOL;  Surgeon: [name], MD;  Location: [facility] ENDOSCOPY;  Service: Endoscopy;  Laterality: N/A;  ? ESOPHAGOGASTRODUODENOSCOPY (EGD) WITH PROPOFOL N/A 03/27/2020  ? Procedure: ESOPHAGOGASTRODUODENOSCOPY (EGD) WITH PROPOFOL;  Surgeon: Lesly Rubenstein, MD;  Location: [facility] ENDOSCOPY;  Service: Endoscopy;  Laterality: N/A;  ? EYE SURGERY    ? RETINA  ? INSERTION OF DIALYSIS CATHETER Right 08/09/2020  ? Procedure: INSERTION OF DIALYSIS CATHETER;  Surgeon: Algernon Huxley, MD;  Location: [facility] ORS;  Service: Vascular;  Laterality: Right;  ? LEFT HEART CATH AND CORONARY ANGIOGRAPHY N/A 08/27/2021  ? Procedure: LEFT HEART CATH AND CORONARY ANGIOGRAPHY;  Surgeon: Dionisio David, MD;  Location: [place] CV [facility];  Service: Cardiovascular;  Laterality: N/A;  ? PERIPHERAL VASCULAR CATHETERIZATION N/A 05/07/2015  ? Procedure: A/V Shuntogram/Fistulagram;  Surgeon: Katha Cabal, MD;  Location: [place] [facility];  Service: Cardiovascular;  Laterality: N/A;  ? PERIPHERAL VASCULAR CATHETERIZATION Left 05/07/2015  ? Procedure: A/V Shunt Intervention;  Surgeon: Katha Cabal, MD;  Location: [place] [facility];  Service: Cardiovascular;  Laterality: Left;  ?  PERIPHERAL VASCULAR THROMBECTOMY Left 07/16/2020  ? Procedure: PERIPHERAL VASCULAR THROMBECTOMY;  Surgeon: Algernon Huxley, MD;  Location: [place] [facility] LAB;  Service: Cardiovascular;  Laterality: L

## 2021-08-28 NOTE — Progress Notes (Signed)
?[facility]  ?ROUNDING NOTE  ? ?Subjective:  ? ?Kolbey Teichert Kleist is a 46 y.o. male with past medical history of diabetes, CAD, hypertension and ESRD on dialysis. Patient presents to ED via EMS after syncopal episodes. Patient has been admitted for Syncope [R55] ? ?Patient is known to our practice from previous admissions and receives outpatient dialysis treatments at [facility], supervised by [facility] physicians. ? ?Patient seen and evaluated during dialysis ?  ?HEMODIALYSIS FLOWSHEET: ? ?Blood Flow Rate (mL/min): 400 mL/min ?Arterial Pressure (mmHg): -220 mmHg ?Venous Pressure (mmHg): 190 mmHg ?Transmembrane Pressure (mmHg): 60 mmHg ?Ultrafiltration Rate (mL/min): 670 mL/min ?Dialysate Flow Rate (mL/min): 500 ml/min ?Conductivity: Machine : 13.7 ?Conductivity: Machine : 13.7 ?Dialysis Fluid Bolus: Normal Saline ?Bolus Amount (mL): 250 mL ? ?No complaints at this time ? ?Objective:  ?Vital signs in last 24 hours:  ?Temp:  [97.6 ?F (36.4 ?C)-99.2 ?F (37.3 ?C)] 98 ?F (36.7 ?C) (05/03 0750) ?Pulse Rate:  [79-98] 83 (05/03 1000) ?Resp:  [11-24] 18 (05/03 1000) ?BP: (72-166)/(52-116) 110/82 (05/03 1000) ?SpO2:  [88 %-100 %] 94 % (05/03 1000) ?Weight:  [125.4 kg-128.4 kg] 128.4 kg (05/03 0844) ? ?Weight change: -2.4 kg ?Filed Weights  ? 08/27/21 1625 08/27/21 1849 08/28/21 0844  ?Weight: 125.4 kg 126.6 kg 128.4 kg  ? ? ?Intake/Output: ?I/O last 3 completed shifts: ?In: -  ?Out: 852 [Other:852] ?  ?Intake/Output this shift: ? No intake/output data recorded. ? ?Physical Exam: ?General: NAD, resting quietly  ?Head: Normocephalic, atraumatic. Moist oral mucosal membranes  ?Eyes: Anicteric  ?Lungs:  Diminished in bases, normal effort, [place] O2  ?Heart: Regular rate and rhythm  ?Abdomen:  Soft, nontender  ?Extremities: No peripheral edema.  ?Neurologic: Nonfocal, moving all four extremities  ?Skin: Generalized ecchymosis  ?Access: Left aVF, Rt Permcath  ? ? ?Basic Metabolic Panel: ?Recent Labs  ?Lab 08/22/21 ?1217  08/24/21 ?1705 08/25/21 ?0144 08/25/21 ?2218 08/27/21 ?1531  ?NA 134* 136 134* 137 135  ?K 4.3 3.9 4.1 4.3 3.8  ?CL 95* 97* 99 102 99  ?CO2 24 22 22  19* 20*  ?GLUCOSE 311* 234* 266* 192* 193*  ?BUN 43* 35* 40* 50* 39*  ?CREATININE 13.95* 13.57* 14.52* 16.15* 14.54*  ?CALCIUM 9.0 9.4 8.9 8.8* 8.6*  ?MG 1.9 2.1  --  2.2  --   ? ? ? ?Liver Function Tests: ?Recent Labs  ?Lab 08/24/21 ?1705 08/25/21 ?2218  ?AST 15 15  ?ALT 9 9  ?ALKPHOS 77 74  ?BILITOT 0.5 0.4  ?PROT 8.6* 7.7  ?ALBUMIN 3.7 3.3*  ? ? ?No results for input(s): LIPASE, AMYLASE in the last 168 hours. ?No results for input(s): AMMONIA in the last 168 hours. ? ?CBC: ?Recent Labs  ?Lab 08/22/21 ?1217 08/24/21 ?1705 08/25/21 ?0144 08/25/21 ?2218  ?WBC 6.8 12.1* 9.3 7.8  ?NEUTROABS  --  10.4*  --   --   ?HGB 12.3* 13.8 12.4* 12.3*  ?HCT 39.9 46.3 40.5 40.4  ?MCV 88.7 91.7 89.4 90.6  ?PLT 135* 168 177 215  ? ? ? ?Cardiac Enzymes: ?No results for input(s): CKTOTAL, CKMB, CKMBINDEX, TROPONINI in the last 168 hours. ? ?BNP: ?Invalid input(s): POCBNP ? ?CBG: ?Recent Labs  ?Lab 08/27/21 ?6237 08/27/21 ?1424 08/27/21 ?2019 08/27/21 ?2153 08/28/21 ?0751  ?GLUCAP 161* 165* 228* 263* 198*  ? ? ? ?Microbiology: ?Results for orders placed or performed during the hospital encounter of 08/24/21  ?Resp Panel by RT-PCR (Flu A&B, Covid) Nasopharyngeal Swab     Status: None  ? Collection Time: 08/24/21  6:22 PM  ? Specimen: Nasopharyngeal Swab; Nasopharyngeal(NP) swabs in vial transport medium  ?Result Value Ref Range Status  ? SARS Coronavirus 2 by RT PCR NEGATIVE NEGATIVE Final  ?  Comment: (NOTE) ?SARS-CoV-2 target nucleic acids are NOT DETECTED. ? ?The SARS-CoV-2 RNA is generally detectable in upper respiratory ?specimens during the acute phase of infection. The lowest ?concentration of SARS-CoV-2 viral copies this assay can detect is ?138 copies/mL. A negative result does not preclude SARS-Cov-2 ?infection and should not be used as the sole basis for treatment or ?other  patient management decisions. A negative result may occur with  ?improper specimen collection/handling, submission of specimen other ?than nasopharyngeal swab, presence of viral mutation(s) within the ?areas targeted by this assay, and inadequate number of viral ?copies(<138 copies/mL). A negative result must be combined with ?clinical observations, patient history, and epidemiological ?information. The expected result is Negative. ? ?Fact Sheet for Patients:  ?EntrepreneurPulse.com.au ? ?Fact Sheet for Healthcare Providers:  ?IncredibleEmployment.be ? ?This test is no t yet approved or cleared by the [place] FDA and  ?has been authorized for detection and/or diagnosis of SARS-CoV-2 by ?FDA under an Emergency Use Authorization (EUA). This EUA will remain  ?in effect (meaning this test can be used) for the duration of the ?COVID-19 declaration under Section 564(b)(1) of the Act, 21 ?U.S.C.section 360bbb-3(b)(1), unless the authorization is terminated  ?or revoked sooner.  ? ? ?  ? Influenza A by PCR NEGATIVE NEGATIVE Final  ? Influenza B by PCR NEGATIVE NEGATIVE Final  ?  Comment: (NOTE) ?The Xpert Xpress SARS-CoV-2/FLU/RSV plus assay is intended as an aid ?in the diagnosis of influenza from Nasopharyngeal swab specimens and ?should not be used as a sole basis for treatment. Nasal washings and ?aspirates are unacceptable for Xpert Xpress SARS-CoV-2/FLU/RSV ?testing. ? ?Fact Sheet for Patients: ?EntrepreneurPulse.com.au ? ?Fact Sheet for Healthcare Providers: ?IncredibleEmployment.be ? ?This test is not yet approved or cleared by the [place] FDA and ?has been authorized for detection and/or diagnosis of SARS-CoV-2 by ?FDA under an Emergency Use Authorization (EUA). This EUA will remain ?in effect (meaning this test can be used) for the duration of the ?COVID-19 declaration under Section 564(b)(1) of the Act, 21 U.S.C. ?section  360bbb-3(b)(1), unless the authorization is terminated or ?revoked. ? ?Performed at [facility], [place], ?[place] [address] ?  ?MRSA Next Gen by PCR, Nasal     Status: None  ? Collection Time: 08/24/21 10:11 PM  ? Specimen: Nasal Mucosa; Nasal Swab  ?Result Value Ref Range Status  ? MRSA by PCR Next Gen NOT DETECTED NOT DETECTED Final  ?  Comment: (NOTE) ?The GeneXpert MRSA Assay (FDA approved for NASAL specimens only), ?is one component of a comprehensive MRSA colonization surveillance ?program. It is not intended to diagnose MRSA infection nor to guide ?or monitor treatment for MRSA infections. ?Test performance is not FDA approved in patients less than 2 years ?old. ?Performed at [facility], [place], ?[place] [address] ?  ? ? ?Coagulation Studies: ?No results for input(s): LABPROT, INR in the last 72 hours. ? ?Urinalysis: ?No results for input(s): COLORURINE, LABSPEC, [place], GLUCOSEU, HGBUR, BILIRUBINUR, KETONESUR, PROTEINUR, UROBILINOGEN, NITRITE, LEUKOCYTESUR in the last 72 hours. ? ?Invalid input(s): APPERANCEUR  ? ? ?Imaging: ?CARDIAC CATHETERIZATION ? ?Result Date: 08/27/2021 ?  LV end diastolic pressure is normal. Normal coronaries with right dominance LVEDP was normal 12 and LV gram was deferred.  Coronary artery disease has been ruled out as  the cause of syncope.  ? ?ECHOCARDIOGRAM COMPLETE ? ?Result Date: 08/28/2021 ?   ECHOCARDIOGRAM REPORT   Patient Name:   ZEPHAN BEAUCHAINE Date of Exam: 08/27/2021 Medical Rec #:  [id number]       Height:       73.0 in Accession #:    [id number]      Weight:       278.4 lb Date of Birth:  September 28, 1975      BSA:          2.476 m? Patient Age:    69 years        BP:           118/96 mmHg Patient Gender: M               HR:           96 bpm. Exam Location:  [facility] Procedure: 2D Echo, Cardiac Doppler, Color Doppler and Intracardiac            Opacification Agent Indications:     R55 Syncope  History:         Patient has prior  history of Echocardiogram examinations, most                  recent 07/05/2015. CHF, CAD, Signs/Symptoms:Shortness of Breath;                  Risk Factors:Diabetes and Hypertension. Dialysis.  Sonographer:     Leontine Locket

## 2021-08-28 NOTE — Consult Note (Signed)
?[facility] ?Vascular Consult Note ? ?MRN : [id number] ? ?[name] [name] is a 46 y.o. (14-Apr-1976) male who presents with chief complaint of  ?Chief Complaint  ?Patient presents with  ? Loss of Consciousness  ?. ? ? ?Consulting Physician: Colon Flattery, NP ?Reason for consult: Missing thrill and bruit ?History of Present Illness: [name] [name] is a 46 year old male that is well-known to our practice with a previous medical history of hypertension, diabetes mellitus, end-stage renal disease that recently had a syncopal event and fell on his left upper extremity.  The patient recently had a graft placed in the left upper extremity.  At his most recent office visit it was noted that his wounds are healed and it was available for use with a palpable thrill and bruit.  However, upon admission to the hospital it was noted that he had no thrill or bruit.  Ultrasound today shows complete occlusion of the left upper extremity hero graft. ? ?Current Facility-Administered Medications  ?Medication Dose Route Frequency Provider Last Rate Last Admin  ? 0.9 %  sodium chloride infusion  250 mL Intravenous PRN Neoma Laming A, MD 250 mL/hr at 08/28/21 0448 250 mL/hr at 08/28/21 0448  ? acetaminophen (TYLENOL) tablet 650 mg  650 mg Oral Q6H PRN Vashti Hey, MD      ? Or  ? acetaminophen (TYLENOL) suppository 650 mg  650 mg Rectal Q6H PRN Vashti Hey, MD      ? aspirin EC tablet 81 mg  81 mg Oral q1800 Vashti Hey, MD   81 mg at 08/28/21 1624  ? bisacodyl (DULCOLAX) EC tablet 5 mg  5 mg Oral Daily PRN Bonnell Public Tublu, MD      ? carvedilol (COREG) tablet 25 mg  25 mg Oral BID Bonnell Public Tublu, MD   25 mg at 08/27/21 2259  ? Chlorhexidine Gluconate Cloth 2 % PADS 6 each  6 each Topical Daily Vashti Hey, MD   6 each at 08/28/21 1752  ? cinacalcet (SENSIPAR) tablet 60 mg  60 mg Oral Q breakfast Bonnell Public Tublu, MD   60 mg at  08/28/21 0820  ? heparin injection 5,000 Units  5,000 Units Subcutaneous Q8H Bonnell Public Tublu, MD   5,000 Units at 08/28/21 1304  ? heparin sodium (porcine) 1000 UNIT/ML injection           ? HYDROcodone-acetaminophen (NORCO/VICODIN) 5-325 MG per tablet 2 tablet  2 tablet Oral Q6H PRN Vashti Hey, MD   2 tablet at 08/27/21 2012  ? hydrOXYzine (ATARAX) tablet 25 mg  25 mg Oral QHS Bonnell Public Tublu, MD   25 mg at 08/27/21 2259  ? insulin aspart (novoLOG) injection 0-6 Units  0-6 Units Subcutaneous TID WC Vashti Hey, MD   2 Units at 08/28/21 1625  ? insulin aspart protamine- aspart (NOVOLOG MIX 70/30) injection 10 Units  10 Units Subcutaneous Q T,Th,S,Su Vashti Hey, MD   10 Units at 08/25/21 1229  ? insulin aspart protamine- aspart (NOVOLOG MIX 70/30) injection 35 Units  35 Units Subcutaneous Q supper Nolberto Hanlon, MD   35 Units at 08/27/21 2031  ? loperamide (IMODIUM) capsule 2 mg  2 mg Oral PRN Vashti Hey, MD   2 mg at 08/25/21 1843  ? losartan (COZAAR) tablet 50 mg  50 mg Oral Daily Bonnell Public Tublu, MD   50 mg at 08/27/21 1514  ? pantoprazole (PROTONIX) EC tablet 40 mg  40 mg  Oral Daily Vashti Hey, MD   40 mg at 08/28/21 1304  ? pneumococcal 20-valent conjugate vaccine (PREVNAR 20) injection 0.5 mL  0.5 mL Intramuscular Tomorrow-1000 Jamse Arn, Dewaine Oats Tublu, MD      ? polyethylene glycol (MIRALAX / GLYCOLAX) packet 17 g  17 g Oral Daily PRN Vashti Hey, MD      ? sevelamer carbonate (RENVELA) powder PACK 2.4 g  2.4 g Oral TID WC Bonnell Public Tublu, MD   2.4 g at 08/28/21 1627  ? sodium chloride 0.9 % bolus 250 mL  250 mL Intravenous Once Foust, Katy L, NP      ? sodium chloride flush (NS) 0.9 % injection 3 mL  3 mL Intravenous Q12H Scoggins, Amber, NP   3 mL at 08/27/21 2321  ? sodium chloride flush (NS) 0.9 % injection 3 mL  3 mL Intravenous Q12H Neoma Laming A, MD   3 mL at 08/27/21 2321  ?  sodium chloride flush (NS) 0.9 % injection 3 mL  3 mL Intravenous PRN Dionisio David, MD      ? ? ?Past Medical History:  ?Diagnosis Date  ? 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[place]) 09/23/2020  ? CHF (congestive heart failure) ([place])   ? Coronary artery disease   ? Diabetes mellitus without complication ([place])   ? Dialysis patient [facility])   ? M-W-F  ? GERD (gastroesophageal reflux disease)   ? Hypertension   ? Kyrle's disease   ? Renal disorder   ? on dialysis for 4 years three times a week last was Tuesday  ? Secondary hyperparathyroidism of renal origin [facility])   ? Shortness of breath dyspnea   ? Type 2 diabetes mellitus with diabetic nephropathy ([place])   ? Type 2 diabetes mellitus with diabetic retinopathy ([place])   ? ? ?Past Surgical History:  ?Procedure Laterality Date  ? A/V FISTULAGRAM Left 10/01/2017  ? Procedure: A/V FISTULAGRAM;  Surgeon: Algernon Huxley, MD;  Location: [place] [facility];  Service: Cardiovascular;  Laterality: Left;  ? A/V FISTULAGRAM Left 05/17/2020  ? Procedure: A/V FISTULAGRAM;  Surgeon: Algernon Huxley, MD;  Location: [place] [facility];  Service: Cardiovascular;  Laterality: Left;  ? A/V SHUNTOGRAM Left 04/16/2021  ? Procedure: A/V SHUNTOGRAM;  Surgeon: Katha Cabal, MD;  Location: [place] [facility];  Service: Cardiovascular;  Laterality: Left;  ? CATARACT EXTRACTION W/PHACO Right 06/30/2018  ? Procedure: CATARACT EXTRACTION PHACO AND INTRAOCULAR LENS PLACEMENT (IOC)-RIGHT;  Surgeon: Eulogio Bear, MD;  Location: [facility] ORS;  Service: Ophthalmology;  Laterality: Right;  [place] 00:07.0 ?CDE 0.16 ?FLUID PACK lOT # [id number]  ? CATARACT EXTRACTION W/PHACO Left 12/24/2018  ? Procedure: CATARACT EXTRACTION PHACO AND INTRAOCULAR LENS PLACEMENT (IOC);  Surgeon: Eulogio Bear, MD;  Location: [facility] ORS;  Service: Ophthalmology;  Laterality: Left;  [place] 00:30.0 ?CDE 2.01 ?FLUID PACK LOT # [id number]  ? COLONOSCOPY WITH PROPOFOL N/A 04/23/2017  ? Procedure: COLONOSCOPY WITH PROPOFOL;  Surgeon: [name], MD;   Location: [facility] ENDOSCOPY;  Service: Endoscopy;  Laterality: N/A;  ? COLONOSCOPY WITH PROPOFOL N/A 07/02/2017  ? Procedure: COLONOSCOPY WITH PROPOFOL;  Surgeon: [name], MD;  Location: [facility] ENDOSCOPY;  Service: Endoscopy;  Laterality: N/A;  ? DIALYSIS/PERMA CATHETER INSERTION N/A 09/27/2020  ? Procedure: DIALYSIS/PERMA CATHETER INSERTION;  Surgeon: Algernon Huxley, MD;  Location: [place] [facility];  Service: Cardiovascular;  Laterality: N/A;  ? DIALYSIS/PERMA CATHETER REMOVAL N/A 11/01/2020  ? Procedure: DIALYSIS/PERMA CATHETER REMOVAL;  Surgeon: Algernon Huxley,  MD;  Location: [place] [facility];  Service: Cardiovascular;  Laterality: N/A;  ? ESOPHAGOGASTRODUODENOSCOPY (EGD) WITH PROPOFOL N/A 04/23/2017  ? Procedure: ESOPHAGOGASTRODUODENOSCOPY (EGD) WITH PROPOFOL;  Surgeon: [name], MD;  Location: [facility] ENDOSCOPY;  Service: Endoscopy;  Laterality: N/A;  ? ESOPHAGOGASTRODUODENOSCOPY (EGD) WITH PROPOFOL N/A 03/27/2020  ? Procedure: ESOPHAGOGASTRODUODENOSCOPY (EGD) WITH PROPOFOL;  Surgeon: Lesly Rubenstein, MD;  Location: [facility] ENDOSCOPY;  Service: Endoscopy;  Laterality: N/A;  ? EYE SURGERY    ? RETINA  ? INSERTION OF DIALYSIS CATHETER Right 08/09/2020  ? Procedure: INSERTION OF DIALYSIS CATHETER;  Surgeon: Algernon Huxley, MD;  Location: [facility] ORS;  Service: Vascular;  Laterality: Right;  ? LEFT HEART CATH AND CORONARY ANGIOGRAPHY N/A 08/27/2021  ? Procedure: LEFT HEART CATH AND CORONARY ANGIOGRAPHY;  Surgeon: Dionisio David, MD;  Location: [place] [facility];  Service: Cardiovascular;  Laterality: N/A;  ? PERIPHERAL VASCULAR CATHETERIZATION N/A 05/07/2015  ? Procedure: A/V Shuntogram/Fistulagram;  Surgeon: Katha Cabal, MD;  Location: [place] [facility];  Service: Cardiovascular;  Laterality: N/A;  ? PERIPHERAL VASCULAR CATHETERIZATION Left 05/07/2015  ? Procedure: A/V Shunt Intervention;  Surgeon: Katha Cabal, MD;  Location: [place] [facility];  Service: Cardiovascular;  Laterality: Left;  ?  PERIPHERAL VASCULAR THROMBECTOMY Left 07/16/2020  ? Procedure: PERIPHERAL VASCULAR THROMBECTOMY;  Surgeon: Algernon Huxley, MD;  Location: [place] [facility];  Service: Cardiovascular;  Laterality: L

## 2021-08-28 NOTE — Progress Notes (Signed)
?Progress Note ? ? ?Patient: [name] [name] OHY:[id number] DOB: [birth year] DOA: 08/24/2021     4 ?DOS: the patient was seen and examined on 08/28/2021 ?  ?Brief hospital course: ?No notes on file ?Whitt Auletta Bignell is an 46 y.o. male with HTN, poorly controlled DM 2, ESRD on HD MWF, CAD was recently seen in the ED 2 days ago with complaints of malaise, decreased p.o. intake with nausea and dizziness.  He was noted to be orthostatic at the time and was given gentle hydration and discharged home.  Patient states that he was doing okay although was feeling rather weak and continued to be nauseated until 4 AM today when he got up to do his laundry.  He states he is felt very dizzy and then "I hit the floor".  Patient called EMS who noted that he had elevated blood sugar but told him he did not need to come to the ED.  Patient's mother also came and made him some bacon and eggs and also did his laundry.  Patient had another episode of syncope on his way back from the bathroom this afternoon.  He notes again that he felt very dizzy and before he could steady himself he "hit the floor".  EMS was called again and this time they brought him to the ED.  Per report, on route to the ED they thought they saw either SVT or VT but they were not sure.  No strip is available. ?  ?At present, patient states that he is weak and tired.  He notes he is hungry and thirsty.  Denies dizziness while he is sitting in bed.  Thinks he might get dizzy if he tried to get up.  Patient denies any chest pain or palpitations.  Denies any shortness of breath or DOE.  Just notes that he feels weak.  Patient notes he has been keeping up with his dialysis on Monday Wednesday and Friday, was last dialyzed yesterday.  Patient also notes he has not been taking his medications, neither his blood pressure nor his insulin because he was not eating very much and he was feeling dizzy. ?  ?  ?  ?5/1 this am patient was found unresponsive after using the restroom.   Initiated compression and CODE BLUE was called.  He achieved ROSC and was initiating breaths independently.  He responded after 6 compressions.  Cardiology was made aware this a.m. by me. Currently in HD without any complaints.  ?  ?5/2 s/p cath today. ?Assessment and Plan: ?No notes have been filed under this hospital service. ?Service: Hospitalist ? ?Syncope secondary to vasovagal reaction. ?Orthostatic hypotension. ?Patient appears to be improving, however, patient states that he has not been able to stand up to walk.  Will obtain PT/OT. ?Continue monitoring orthostatic vital signs.  Telemetry did not show any arrhythmia. ?Patient will be followed by cardiology after discharge for Holter monitor. ?Plan to discharge home tomorrow. ? ?Elevated troponin secondary to syncope. ?Possible SVT. ?Patient was seen by cardiology, coronary angiogram did not show any significant obstruction. ? ?End-stage renal disease ?Followed by nephrology. ? ?Essential hypertension. ?Appear to be stable. ? ?Poorly controlled type 2 diabetes with hyperglycemia. ?Noncompliant at home, continue scheduled long-acting insulin and sliding scale insulin. ? ? ?  ? ?Subjective:  ?Patient still feels dizzy and weak, but has not been able to stand or walk in the hospital. ?Denies any short of breath ? ?Physical Exam: ?Vitals:  ? 08/28/21 1115 08/28/21 1130 08/28/21 1204 08/28/21  1245  ?BP: 102/72 98/65 108/68   ?Pulse: 90 86 85 87  ?Resp: 18 14 15 14   ?Temp:   98.7 ?F (37.1 ?C)   ?TempSrc:   Oral   ?SpO2: 94% 96% 92% 97%  ?Weight:   127.2 kg   ?Height:      ? ?General exam: Appears calm and comfortable  ?Respiratory system: Clear to auscultation. Respiratory effort normal. ?Cardiovascular system: S1 & S2 heard, RRR. No JVD, murmurs, rubs, gallops or clicks. No pedal edema. ?Gastrointestinal system: Abdomen is nondistended, soft and nontender. No organomegaly or masses felt. Normal bowel sounds heard. ?Central nervous system: Alert and oriented. No  focal neurological deficits. ?Extremities: Symmetric 5 x 5 power. ?Skin: No rashes, lesions or ulcers ?Psychiatry: Judgement and insight appear normal. Mood & affect appropriate.  ? ?Data Reviewed: ? ?Lab results reviewed ? ?Family Communication:  ? ?Disposition: ?Status is: Inpatient ?Remains inpatient appropriate because: Severity of disease, persistent symptoms ? Planned Discharge Destination: Home ? ? ? ?Time spent: 26 minutes ? ?Author: ?Sharen Hones, MD ?08/28/2021 3:11 PM ? ?For on call review [email or web address].  ?

## 2021-08-28 NOTE — Progress Notes (Signed)
Notified APP pt BP low SBP in 70's. New order placed to give IVF. Will continue to monitor. ?  ? ?

## 2021-08-28 NOTE — Progress Notes (Incomplete)
Inpatient Diabetes Program Recommendations ? ?AACE/ADA: New Consensus Statement on Inpatient Glycemic Control (2015) ? ?Target Ranges:  Prepandial:   less than 140 mg/dL ?     Peak postprandial:   less than 180 mg/dL (1-2 hours) ?     Critically ill patients:  140 - 180 mg/dL  ? ? Latest Reference Range & Units 08/27/21 07:49 08/27/21 09:38 08/27/21 14:24 08/27/21 20:19 08/27/21 21:53  ?Glucose-Capillary 70 - 99 mg/dL 180 (H) 161 (H) 165 (H) 228 (H) 263 (H)  ?(H): Data is abnormally high ? Latest Reference Range & Units 08/28/21 07:51  ?Glucose-Capillary 70 - 99 mg/dL 198 (H)  ?(H): Data is abnormally high ? Latest Reference Range & Units 08/24/21 17:05  ?Hemoglobin A1C 4.8 - 5.6 % 10.0 (H) ? ?(240 mg/dl)  ?(H): Data is abnormally high ? ? ?Admit with: Syncope ? ?History: DM, ESRD, CHF ? ?Home DM Meds: 70/30 Insulin 10 units QAM (T, Thu, Fri, Sat, Sun--Non dialysis days) + 35 units QPM ? ?Current Orders: 70/30 Insulin 10 units QAM (T, Thu, Fri, Sat, Sun--Non dialysis days) + 35 units QPM ?    Novolog 0-6 units TID ? ? ?Cath yest along with HD ? ?HD again this AM ? ?

## 2021-08-29 ENCOUNTER — Encounter: Payer: Self-pay | Admitting: Vascular Surgery

## 2021-08-29 ENCOUNTER — Inpatient Hospital Stay
Admission: EM | Disposition: A | Discharge status: Home / Self Care | Payer: Self-pay | Source: Home / Self Care | Attending: Internal Medicine

## 2021-08-29 DIAGNOSIS — N186 End stage renal disease: Secondary | ICD-10-CM

## 2021-08-29 DIAGNOSIS — Z992 Dependence on renal dialysis: Secondary | ICD-10-CM

## 2021-08-29 DIAGNOSIS — T82868A Thrombosis of vascular prosthetic devices, implants and grafts, initial encounter: Secondary | ICD-10-CM

## 2021-08-29 DIAGNOSIS — R55 Syncope and collapse: Secondary | ICD-10-CM | POA: Diagnosis not present

## 2021-08-29 DIAGNOSIS — E1165 Type 2 diabetes mellitus with hyperglycemia: Secondary | ICD-10-CM | POA: Diagnosis not present

## 2021-08-29 HISTORY — PX: A/V SHUNTOGRAM: CATH118297

## 2021-08-29 LAB — RENAL FUNCTION PANEL
Albumin: 3.3 g/dL — ABNORMAL LOW (ref 3.5–5.0)
Anion gap: 14 (ref 5–15)
BUN: 32 mg/dL — ABNORMAL HIGH (ref 6–20)
CO2: 21 mmol/L — ABNORMAL LOW (ref 22–32)
Calcium: 8.3 mg/dL — ABNORMAL LOW (ref 8.9–10.3)
Chloride: 98 mmol/L (ref 98–111)
Creatinine, Ser: 11.88 mg/dL — ABNORMAL HIGH (ref 0.61–1.24)
GFR, Estimated: 5 mL/min — ABNORMAL LOW (ref >60)
Glucose, Bld: 226 mg/dL — ABNORMAL HIGH (ref 70–99)
Phosphorus: 4.6 mg/dL (ref 2.5–4.6)
Potassium: 3.9 mmol/L (ref 3.5–5.1)
Sodium: 133 mmol/L — ABNORMAL LOW (ref 135–145)

## 2021-08-29 LAB — CBC
HCT: 42.2 % (ref 39.0–52.0)
Hemoglobin: 12.9 g/dL — ABNORMAL LOW (ref 13.0–17.0)
MCH: 27.8 pg (ref 26.0–34.0)
MCHC: 30.6 g/dL (ref 30.0–36.0)
MCV: 90.9 fL (ref 80.0–100.0)
Platelets: 198 10*3/uL (ref 150–400)
RBC: 4.64 MIL/uL (ref 4.22–5.81)
RDW: 16 % — ABNORMAL HIGH (ref 11.5–15.5)
WBC: 11.7 10*3/uL — ABNORMAL HIGH (ref 4.0–10.5)
nRBC: 0 % (ref 0.0–0.2)

## 2021-08-29 LAB — GLUCOSE, CAPILLARY
Glucose-Capillary: 217 mg/dL — ABNORMAL HIGH (ref 70–99)
Glucose-Capillary: 224 mg/dL — ABNORMAL HIGH (ref 70–99)
Glucose-Capillary: 226 mg/dL — ABNORMAL HIGH (ref 70–99)
Glucose-Capillary: 476 mg/dL — ABNORMAL HIGH (ref 70–99)

## 2021-08-29 SURGERY — A/V SHUNTOGRAM
Anesthesia: Moderate Sedation | Laterality: Left

## 2021-08-29 MED ORDER — MIDAZOLAM HCL 2 MG/2ML IJ SOLN
INTRAMUSCULAR | Status: DC | PRN
  Administered 2021-08-29: 2 mg via INTRAVENOUS

## 2021-08-29 MED ORDER — METHYLPREDNISOLONE SODIUM SUCC 125 MG IJ SOLR
INTRAMUSCULAR | Status: AC
Start: 2021-08-29 — End: 2021-08-29
  Filled 2021-08-29: qty 2

## 2021-08-29 MED ORDER — CEFAZOLIN SODIUM-DEXTROSE 1-4 GM/50ML-% IV SOLN
INTRAVENOUS | Status: AC
  Administered 2021-08-29: 1 g via INTRAVENOUS
  Filled 2021-08-29: qty 50

## 2021-08-29 MED ORDER — HEPARIN SODIUM (PORCINE) 1000 UNIT/ML IJ SOLN
INTRAMUSCULAR | Status: AC
  Filled 2021-08-29: qty 10

## 2021-08-29 MED ORDER — FAMOTIDINE 20 MG PO TABS
40.0000 mg | ORAL_TABLET | Freq: Once | ORAL | Status: AC | PRN
  Administered 2021-08-29: 40 mg via ORAL

## 2021-08-29 MED ORDER — ALTEPLASE 1 MG/ML SYRINGE FOR VASCULAR PROCEDURE
INTRAMUSCULAR | Status: DC | PRN
  Administered 2021-08-29: 4 mg via INTRA_ARTERIAL

## 2021-08-29 MED ORDER — ONDANSETRON HCL 4 MG/2ML IJ SOLN
4.0000 mg | Freq: Four times a day (QID) | INTRAMUSCULAR | Status: DC | PRN
Start: 2021-08-29 — End: 2021-09-01

## 2021-08-29 MED ORDER — FENTANYL CITRATE (PF) 100 MCG/2ML IJ SOLN
INTRAMUSCULAR | Status: DC | PRN
  Administered 2021-08-29: 50 ug via INTRAVENOUS

## 2021-08-29 MED ORDER — MIDAZOLAM HCL 2 MG/2ML IJ SOLN
INTRAMUSCULAR | Status: AC
  Filled 2021-08-29: qty 4

## 2021-08-29 MED ORDER — CEFAZOLIN SODIUM-DEXTROSE 1-4 GM/50ML-% IV SOLN
1.0000 g | INTRAVENOUS | Status: AC
  Filled 2021-08-29: qty 50

## 2021-08-29 MED ORDER — MIDAZOLAM HCL 2 MG/ML PO SYRP: 8.0000 mg | ORAL_SOLUTION | Freq: Once | ORAL | Status: DC | PRN

## 2021-08-29 MED ORDER — HYDROMORPHONE HCL 1 MG/ML IJ SOLN: 1.0000 mg | Freq: Once | INTRAMUSCULAR | Status: DC | PRN

## 2021-08-29 MED ORDER — METHYLPREDNISOLONE SODIUM SUCC 125 MG IJ SOLR
125.0000 mg | Freq: Once | INTRAMUSCULAR | Status: AC | PRN
  Administered 2021-08-29: 125 mg via INTRAVENOUS

## 2021-08-29 MED ORDER — HEPARIN SODIUM (PORCINE) 1000 UNIT/ML IJ SOLN
INTRAMUSCULAR | Status: DC | PRN
  Administered 2021-08-29: 4000 [IU] via INTRAVENOUS

## 2021-08-29 MED ORDER — IODIXANOL 320 MG/ML IV SOLN
INTRAVENOUS | Status: DC | PRN
  Administered 2021-08-29: 20 mL via INTRAVENOUS

## 2021-08-29 MED ORDER — DIPHENHYDRAMINE HCL 50 MG/ML IJ SOLN
INTRAMUSCULAR | Status: AC
  Filled 2021-08-29: qty 1

## 2021-08-29 MED ORDER — FAMOTIDINE 20 MG PO TABS
ORAL_TABLET | ORAL | Status: AC
  Filled 2021-08-29: qty 2

## 2021-08-29 MED ORDER — DIPHENHYDRAMINE HCL 50 MG/ML IJ SOLN
50.0000 mg | Freq: Once | INTRAMUSCULAR | Status: AC | PRN
  Administered 2021-08-29: 50 mg via INTRAVENOUS

## 2021-08-29 MED ORDER — FENTANYL CITRATE (PF) 100 MCG/2ML IJ SOLN
INTRAMUSCULAR | Status: AC
  Filled 2021-08-29: qty 2

## 2021-08-29 MED ORDER — SODIUM CHLORIDE 0.9 % IV SOLN: INTRAVENOUS | Status: DC

## 2021-08-29 SURGICAL SUPPLY — 10 items
CANNULA 5F STIFF (CANNULA) ×1 IMPLANT
CATH EMBOLECTOMY 5FR (BALLOONS) ×1 IMPLANT
CATH THROMBEC CLEANERXT 6X65 (CATHETERS) ×1 IMPLANT
COVER PROBE U/S 5X48 (MISCELLANEOUS) ×1 IMPLANT
KIT ENCORE 26 ADVANTAGE (KITS) ×1 IMPLANT
PACK ANGIOGRAPHY (CUSTOM PROCEDURE TRAY) ×2 IMPLANT
SHEATH BRITE TIP 6FRX5.5 (SHEATH) ×2 IMPLANT
SUT MNCRL AB 4-0 PS2 18 (SUTURE) ×1 IMPLANT
TOWEL OR 17X26 4PK STRL BLUE (TOWEL DISPOSABLE) ×1 IMPLANT
WIRE MAGIC TOR.035 180C (WIRE) ×1 IMPLANT

## 2021-08-29 NOTE — Progress Notes (Signed)
?  Progress Note ? ? ?Patient: [name] [name] TFT:[id number] DOB: 11-20-1975 DOA: 08/24/2021     5 ?DOS: the patient was seen and examined on 08/29/2021 ?  ?Brief hospital course: ?[name] [name] is an 46 y.o. male with HTN, poorly controlled DM 2, ESRD on HD MWF, CAD was recently seen in the ED 2 days ago with complaints of malaise, decreased p.o. intake with nausea and dizziness.  He was noted to be orthostatic at the time and was given gentle hydration and discharged home.  He came back to the hospital after syncope episode.  Patient also has been feeling dizzy since arriving the hospital. ?Had another episode of syncope on 5/1.  Coronary angiogram was performed 5/2 without significant occlusion. ?AV fistula graft thrombectomy was performed on 5/4. ? ?Assessment and Plan: ?Syncope secondary to vasovagal reaction. ?Orthostatic hypotension. ?Patient still feeling dizzy, but no additional syncope. ?I started PT/OT to evaluate patient to see if he can safely walk. ? ?Elevated troponin secondary to syncope. ?Possible SVT. ?Negative coronary angiogram. ? ?End-stage renal disease ?AV fistula thrombosis ?Followed by nephrology. ?Patient had an AV fistula thrombectomy performed today. ? ? ?Essential hypertension. ?Appear to be stable. ? ?Poorly controlled type 2 diabetes with hyperglycemia. ?Noncompliant at home, continue scheduled long-acting insulin and sliding scale insulin. ? ?Chest wall pain. ?Continue some pain medicine. ? ?  ? ?Subjective:  ?Patient doing better today, complaining some chest pain since admission.  Appears to be persistent, reproducible by palpitation at right sternal border. ?No short of breath. ? ?Physical Exam: ?Vitals:  ? 08/29/21 1115 08/29/21 1130 08/29/21 1145 08/29/21 1232  ?BP: 103/72 104/77 107/72 104/80  ?Pulse: 84 84 86 85  ?Resp: 16 15 16 16   ?Temp:    98 ?F (36.7 ?C)  ?TempSrc:      ?SpO2: 95%   93%  ?Weight:      ?Height:      ? ?General exam: Appears calm and comfortable  ?Respiratory  system: Clear to auscultation. Respiratory effort normal. ?Cardiovascular system: S1 & S2 heard, RRR. No JVD, murmurs, rubs, gallops or clicks. No pedal edema. ?Gastrointestinal system: Abdomen is nondistended, soft and nontender. No organomegaly or masses felt. Normal bowel sounds heard. ?Central nervous system: Alert and oriented. No focal neurological deficits. ?Extremities: Symmetric 5 x 5 power. ?Skin: No rashes, lesions or ulcers ?Psychiatry: Judgement and insight appear normal. Mood & affect appropriate.  ?Chest wall tenderness ? ?Data Reviewed: ? ?There are no new results to review at this time. ? ?Family Communication: Brother updated at the bedside. ? ?Disposition: ?Status is: Inpatient ?Remains inpatient appropriate because: Severity of disease, post procedure today. ? Planned Discharge Destination: Home ? ? ? ?Time spent: 28 minutes ? ?Author: ?Sharen Hones, MD ?08/29/2021 1:46 PM ? ?For on call review [email or web address].  ?

## 2021-08-29 NOTE — Evaluation (Signed)
Physical Therapy Evaluation ?Patient Details ?Name: [name] [name] ?MRN: [id number] ?DOB: 25-Sep-1975 ?Today's Date: 08/29/2021 ? ?History of Present Illness ? 46 y.o. male with HTN, poorly controlled DM 2, ESRD on HD MWF, CAD was recently seen in the ED 2 days ago with complaints of malaise, decreased p.o. intake with nausea and dizziness.  He was noted to be orthostatic at the time and was given gentle hydration and discharged home.  He came back to the hospital after syncope episode.  Patient also has been feeling dizzy since arriving the hospital. Had another episode of syncope on 5/1.  Coronary angiogram was performed 5/2 without significant occlusion. AV fistula graft thrombectomy was performed on 5/4.  ?Clinical Impression ? [name] [name] is a pleasant and cooperative pt who agreed to participate in evaluation today. Pt reported of R sided chest pain constant and with breathing. Pt appears to have decreased executive decision making ability evident by being fixated in going home from the hospital upon discharge due to pending bills and car. Pt demonstrates decreased strength in BLE and unsteady gait with BP 101/77 in standing. Pt required min to mod assist to transfer STS due to proximal weakness. Ambulated in room ~35 ft with Min assist of 1. Pt fatigues easily. Pt will benefit form SNF to improve balance, endurance, strength and safety awareness to return to PLOF and return home alone  safely. ?   ? ?Recommendations for follow up therapy are one component of a multi-disciplinary discharge planning process, led by the attending physician.  Recommendations may be updated based on patient status, additional functional criteria and insurance authorization. ? ?Follow Up Recommendations Skilled nursing-short term rehab (<3 hours/day) ? ?  ?Assistance Recommended at Discharge Frequent or constant Supervision/Assistance  ?Patient can return home with the following ? A lot of help with walking and/or transfers;A lot of  help with bathing/dressing/bathroom;Assistance with cooking/housework;Direct supervision/assist for medications management;Direct supervision/assist for financial management;Assist for transportation ? ?  ?Equipment Recommendations Rolling walker (2 wheels)  ?Recommendations for Other Services ?    ?  ?Functional Status Assessment Patient has had a recent decline in their functional status and demonstrates the ability to make significant improvements in function in a reasonable and predictable amount of time.  ? ?  ?Precautions / Restrictions Precautions ?Precautions: Fall ?Restrictions ?Weight Bearing Restrictions: No  ? ?  ? ?Mobility ? Bed Mobility ?Overal bed mobility: Needs Assistance ?Bed Mobility: Supine to Sit, Sit to Supine ?  ?  ?Supine to sit: Min assist ?Sit to supine: Min assist ?  ?  ?  ? ?Transfers ?Overall transfer level: Needs assistance ?Equipment used: Rolling walker (2 wheels) ?Transfers: Sit to/from Stand ?Sit to Stand: Min assist, Mod assist (CGA from higher level) ?  ?  ?  ?  ?  ?General transfer comment: from std height bed, requiring MIN-MOD A and ant/post momentum shift with VC for hand/foot placement prior to improve safe technique ?  ? ?Ambulation/Gait ?Ambulation/Gait assistance: Min assist ?Gait Distance (Feet): 35 Feet ?Assistive device: Rolling walker (2 wheels) ?Gait Pattern/deviations: Decreased stride length, Decreased weight shift to right, Decreased weight shift to left, Decreased step length - right, Decreased step length - left ?Gait velocity: decreased ?  ?  ?General Gait Details: unsteady ? ?Stairs ?  ?  ?  ?  ?  ? ?Wheelchair Mobility ?  ? ?Modified Rankin (Stroke Patients Only) ?  ? ?  ? ?Balance Overall balance assessment: Needs assistance ?Sitting-balance support: No upper extremity supported, Single  extremity supported, Feet supported ?Sitting balance-Leahy Scale: Fair ?  ?  ?Standing balance support: Bilateral upper extremity supported, Reliant on assistive device for  balance ?Standing balance-Leahy Scale: Fair ?Standing balance comment: with initial stand, sat right back down promptly 2/2 posterior LOB, improved on 2nd attempt, fair static and dynamic balance but requiring UE support on the RW for mobility ?  ?  ?  ?  ?  ?  ?  ?  ?  ?  ?  ?   ? ? ? ?Pertinent Vitals/Pain Pain Assessment ?Pain Assessment: No/denies pain  ? ? ?Home Living Family/patient expects to be discharged to:: Private residence ?Living Arrangements: Alone ?Available Help at Discharge: Family;Available PRN/intermittently ?Type of Home: Apartment ?Home Access: Stairs to enter ?  ?Entrance Stairs-Number of Steps: 1 step, 1st fl apt ?  ?Home Layout: One level ?Home Equipment: None ?Additional Comments: Pt expresses concern about being able to ensure he pays for his car tags and doesn't miss his public housing appt coming up  ?  ?Prior Function Prior Level of Function : Independent/Modified Independent ?  ?  ?  ?  ?  ?  ?Mobility Comments: several falls ?ADLs Comments: reports being independent until a couple weeks ago when he began to get really dizzy and requiring assist from his elderly mother for meals (unclear if more assist was needed) ?  ? ? ?Hand Dominance  ?   ? ?  ?Extremity/Trunk Assessment  ? Upper Extremity Assessment ?Upper Extremity Assessment: Defer to OT evaluation ?  ? ?Lower Extremity Assessment ?Lower Extremity Assessment: Generalized weakness (BLE 3 to 3+/ 5 grossly) ?  ? ?Cervical / Trunk Assessment ?Cervical / Trunk Assessment: Normal  ?Communication  ? Communication: No difficulties  ?Cognition Arousal/Alertness: Awake/alert ?Behavior During Therapy: [facility] for tasks assessed/performed ?Overall Cognitive Status: No family/caregiver present to determine baseline cognitive functioning ?  ?  ?  ?  ?  ?  ?  ?  ?  ?  ?  ?  ?  ?  ?  ?  ?General Comments: alert and oriented, questionable executive functioning deficits, impaired insight, requires intermittent VC for safety and problem solving ?  ?   ? ?  ?General Comments   ? ?  ?Exercises    ? ?Assessment/Plan  ?  ?PT Assessment Patient needs continued PT services  ?PT Problem List Decreased strength;Decreased activity tolerance;Decreased balance;Decreased mobility;Decreased safety awareness;Impaired sensation ? ?   ?  ?PT Treatment Interventions Gait training;Functional mobility training;Therapeutic activities;Therapeutic exercise;Balance training;Neuromuscular re-education;Cognitive remediation;Patient/family education   ? ?PT Goals (Current goals can be found in the Care Plan section)  ?Acute Rehab PT Goals ?Patient Stated Goal: to get well ?PT Goal Formulation: With patient ?Time For Goal Achievement: 09/12/21 ?Potential to Achieve Goals: Good ? ?  ?Frequency Min 2X/week ?  ? ? ?Co-evaluation   ?  ?  ?  ?  ? ? ?  ?AM-PAC PT "6 Clicks" Mobility  ?Outcome Measure Help needed turning from your back to your side while in a flat bed without using bedrails?: A Little ?Help needed moving from lying on your back to sitting on the side of a flat bed without using bedrails?: A Little ?Help needed moving to and from a bed to a chair (including a wheelchair)?: A Lot ?Help needed standing up from a chair using your arms (e.g., wheelchair or bedside chair)?: A Lot ?Help needed to walk in hospital room?: A Little ?Help needed climbing 3-5 steps with a railing? : Total ?  6 Click Score: 14 ? ?  ?End of Session Equipment Utilized During Treatment: Gait belt ?Activity Tolerance: Patient limited by fatigue ?Patient left: in bed;with call bell/phone within reach ?Nurse Communication: Mobility status (neds a Recliner in room) ?PT Visit Diagnosis: Unsteadiness on feet (R26.81);Repeated falls (R29.6);Muscle weakness (generalized) (M62.81);History of falling (Z91.81) ?  ? ?Time: 3374-4514 ?PT Time Calculation (min) (ACUTE ONLY): 23 min ? ? ?Charges:   PT Evaluation ?$PT Eval Low Complexity: 1 Low ?PT Treatments ?$Gait Training: 8-22 mins ?$Therapeutic Activity: 8-22 mins ?  ?    ? ?Joaquin Music PT DPT ?4:58 PM,08/29/21  ? ?

## 2021-08-29 NOTE — Progress Notes (Signed)
[facility] 476 MD made aware ?

## 2021-08-29 NOTE — Evaluation (Signed)
Occupational Therapy Evaluation ?Patient Details ?Name: [name] [name] ?MRN: [id number] ?DOB: 19-May-1975 ?Today's Date: 08/29/2021 ? ? ?History of Present Illness 46 y.o. male with HTN, poorly controlled DM 2, ESRD on HD MWF, CAD was recently seen in the ED 2 days ago with complaints of malaise, decreased p.o. intake with nausea and dizziness.  He was noted to be orthostatic at the time and was given gentle hydration and discharged home.  He came back to the hospital after syncope episode.  Patient also has been feeling dizzy since arriving the hospital. Had another episode of syncope on 5/1.  Coronary angiogram was performed 5/2 without significant occlusion. AV fistula graft thrombectomy was performed on 5/4.  ? ?Clinical Impression ?  ?Pt was seen for OT evaluation this date. Prior to hospital admission, pt was independent up until about 2 weeks ago when he began to experience significant dizziness with mobility and frequent falls. Pt reports he required assist from his elderly mother due to these impairments and was limited in his mobility. Currently pt demonstrates impairments as described below (See OT problem list) which functionally limit his ability to perform ADL/self-care tasks and mobility. Pt required increased time/effort to complete bed mobility, MIN-MOD A for ADL transfers, cues for RW mgt/safety/problem solving, and MIN-MOD A for LB ADL. Pt denied dizziness/whooziness/weakness with standing trials and during orthostatics: supine BP 115/85, sitting BP 112/78, standing 28min BP 101/77, standing 81min 107/83. Pt is a very high falls risk, reliant on RW this date, which is new for him. Pt educated in role of OT, benefits of additional therapy, and falls prevention. Pt would benefit from skilled OT services to address noted impairments and functional limitations (see below for any additional details) in order to maximize safety and independence while minimizing falls risk and caregiver burden. Upon hospital  discharge, recommend STR to maximize pt safety and return to PLOF.   ? ?Recommendations for follow up therapy are one component of a multi-disciplinary discharge planning process, led by the attending physician.  Recommendations may be updated based on patient status, additional functional criteria and insurance authorization.  ? ?Follow Up Recommendations ? Skilled nursing-short term rehab (<3 hours/day)  ?  ?Assistance Recommended at Discharge Intermittent Supervision/Assistance  ?Patient can return home with the following A little help with walking and/or transfers;A little help with bathing/dressing/bathroom;Assistance with cooking/housework;Assist for transportation;Help with stairs or ramp for entrance;Direct supervision/assist for medications management ? ?  ?Functional Status Assessment ? Patient has had a recent decline in their functional status and demonstrates the ability to make significant improvements in function in a reasonable and predictable amount of time.  ?Equipment Recommendations ? Other (comment) (2WW)  ?  ?Recommendations for Other Services   ? ? ?  ?Precautions / Restrictions Precautions ?Precautions: Fall ?Restrictions ?Weight Bearing Restrictions: No  ? ?  ? ?Mobility Bed Mobility ?Overal bed mobility: Needs Assistance ?Bed Mobility: Supine to Sit ?  ?  ?Supine to sit: Min assist ?  ?  ?  ?  ? ?Transfers ?Overall transfer level: Needs assistance ?Equipment used: Rolling walker (2 wheels) ?Transfers: Sit to/from Stand ?Sit to Stand: Min assist, Mod assist ?  ?  ?  ?  ?  ?General transfer comment: from std height bed, requiring MIN-MOD A and ant/post momentum shift with VC for hand/foot placement prior to improve safe technique ?  ? ?  ?Balance Overall balance assessment: Needs assistance ?Sitting-balance support: No upper extremity supported, Single extremity supported, Feet supported ?Sitting balance-Leahy Scale: Fair ?  ?  ?  Standing balance support: Bilateral upper extremity  supported ?Standing balance-Leahy Scale: Fair ?Standing balance comment: with initial stand, sat right back down promptly 2/2 posterior LOB, improved on 2nd attempt, fair static and dynamic balance but requiring UE support on the RW for mobility ?  ?  ?  ?  ?  ?  ?  ?  ?  ?  ?  ?   ? ?ADL either performed or assessed with clinical judgement  ? ?ADL Overall ADL's : Needs assistance/impaired ?  ?  ?  ?  ?  ?  ?  ?  ?  ?  ?  ?  ?  ?  ?  ?  ?  ?  ?  ?General ADL Comments: Pt currently requires MIN-MOD A for LB ADL involving ADL Transfers, cues for safety, MIN-MOD A for ADL transfers, and set up/supv for seated UB ADL tasks.  ? ? ? ?Vision   ?   ?   ?Perception   ?  ?Praxis   ?  ? ?Pertinent Vitals/Pain Pain Assessment ?Pain Assessment: 0-10 ?Pain Score: 10-Worst pain ever ?Pain Location: 10/10 R chest pain/SOB with inhale ?Pain Descriptors / Indicators: Aching ?Pain Intervention(s): Limited activity within patient's tolerance, Monitored during session, Repositioned, Patient requesting pain meds-RN notified  ? ? ? ?Hand Dominance   ?  ?Extremity/Trunk Assessment Upper Extremity Assessment ?Upper Extremity Assessment: Generalized weakness ?  ?Lower Extremity Assessment ?Lower Extremity Assessment: Generalized weakness ?  ?Cervical / Trunk Assessment ?Cervical / Trunk Assessment: Normal ?  ?Communication Communication ?Communication: No difficulties ?  ?Cognition Arousal/Alertness: Awake/alert ?Behavior During Therapy: [facility] for tasks assessed/performed ?Overall Cognitive Status: No family/caregiver present to determine baseline cognitive functioning ?  ?  ?  ?  ?  ?  ?  ?  ?  ?  ?  ?  ?  ?  ?  ?  ?General Comments: alert and oriented, questionable executive functioning deficits, impaired insight, requires intermittent VC for safety and problem solving ?  ?  ?General Comments    ? ?  ?Exercises Other Exercises ?Other Exercises: Pt educated in falls prevention strategies, role of OT, role of STR ?  ?Shoulder Instructions     ? ? ?Home Living Family/patient expects to be discharged to:: Private residence ?Living Arrangements: Alone ?Available Help at Discharge: Family;Available PRN/intermittently (older mother) ?Type of Home: Apartment ?Home Access: Stairs to enter ?Entrance Stairs-Number of Steps: 1 step, 1st fl apt ?  ?Home Layout: One level ?  ?  ?Bathroom Shower/Tub: Tub/shower unit ?  ?Bathroom Toilet: Standard ?  ?  ?Home Equipment: None ?  ?Additional Comments: Pt expresses concern about being able to ensure he pays for his car tags and doesn't miss his public housing appt coming up ?  ? ?  ?Prior Functioning/Environment Prior Level of Function : Independent/Modified Independent ?  ?  ?  ?  ?  ?  ?Mobility Comments: several falls ?ADLs Comments: reports being independent until a couple weeks ago when he began to get really dizzy and requiring assist from his elderly mother for meals (unclear if more assist was needed) ?  ? ?  ?  ?OT Problem List: Decreased strength;Pain;Decreased activity tolerance;Decreased safety awareness;Impaired balance (sitting and/or standing);Decreased knowledge of use of DME or AE ?  ?   ?OT Treatment/Interventions: Self-care/ADL training;Therapeutic exercise;Therapeutic activities;Energy conservation;DME and/or AE instruction;Patient/family education;Balance training  ?  ?OT Goals(Current goals can be found in the care plan section) Acute Rehab OT Goals ?Patient Stated Goal: go  to rehab to get stronger before going home ?OT Goal Formulation: With patient ?Time For Goal Achievement: 09/12/21 ?Potential to Achieve Goals: Good ?ADL Goals ?Pt Will Perform Lower Body Dressing: with modified independence;with adaptive equipment;sit to/from stand ?Pt Will Transfer to Toilet: with supervision;ambulating (elevated commode, LRAD) ?Pt Will Perform Toileting - Clothing Manipulation and hygiene: with modified independence ?Additional ADL Goal #1: Pt will verbalize plan to implement at least 2 learned falls  prevention strategies. ?Additional ADL Goal #2: Pt will complete all aspects of bathing, primarily from seated position, with set up and supervision for safety.  ?OT Frequency: Min 2X/week ?  ? ?Co-evaluation   ?  ?  ?  ?

## 2021-08-29 NOTE — Interval H&P Note (Signed)
History and Physical Interval Note: ? ?08/29/2021 ?10:10 AM ? ?[name] [name]  has presented today for surgery, with the diagnosis of End-stage renal disease.  The various methods of treatment have been discussed with the patient and family. After consideration of risks, benefits and other options for treatment, the patient has consented to  Procedure(s): ?A/V SHUNTOGRAM (Left) as a surgical intervention.  The patient's history has been reviewed, patient examined, no change in status, stable for surgery.  I have reviewed the patient's chart and labs.  Questions were answered to the patient's satisfaction.   ? ? ?[name] ? ? ?

## 2021-08-29 NOTE — Progress Notes (Signed)
OT Cancellation Note ? ?Patient Details ?Name: [name] [name] ?MRN: [id number] ?DOB: Sep 03, 1975 ? ? ?Cancelled Treatment:    Reason Eval/Treat Not Completed: Patient at procedure or test/ unavailable. Consult received, chart reviewed. Pt currently off the unit. Per chart, scheduled for vascular procedure today. Will re-attempt OT evaluation at later date/time as pt is available and medically appropriate.  ? ?Ardeth Perfect., MPH, MS, OTR/L ?ascom [phone] ?08/29/21, 10:05 AM ? ?

## 2021-08-29 NOTE — Progress Notes (Signed)
?[facility]  ?ROUNDING NOTE  ? ?Subjective:  ? ?[name] [name] is a 46 y.o. male with past medical history of diabetes, CAD, hypertension and ESRD on dialysis. Patient presents to ED via EMS after syncopal episodes. Patient has been admitted for Syncope [R55] ? ?Patient is known to our practice from previous admissions and receives outpatient dialysis treatments at [facility], supervised by [facility] physicians. ? ?Update ?Patient seen prior to vascular procedure ?Currently n.p.o. ?Still continues to complain of chest discomfort, described as a shooting pain. ? ?Objective:  ?Vital signs in last 24 hours:  ?Temp:  [97.9 ?F (36.6 ?C)-99.4 ?F (37.4 ?C)] 98.2 ?F (36.8 ?C) (05/04 1538) ?Pulse Rate:  [79-93] 88 (05/04 1538) ?Resp:  [12-21] 16 (05/04 1538) ?BP: (94-139)/(42-95) 101/68 (05/04 1538) ?SpO2:  [92 %-100 %] 98 % (05/04 1538) ? ?Weight change: 3 kg ?Filed Weights  ? 08/27/21 1849 08/28/21 0844 08/28/21 1204  ?Weight: 126.6 kg 128.4 kg 127.2 kg  ? ? ?Intake/Output: ?I/O last 3 completed shifts: ?In: 240 [P.O.:240] ?Out: 1069 [Other:1069] ?  ?Intake/Output this shift: ? Total I/O ?In: 540 [P.O.:240; I.V.:300] ?Out: -  ? ?Physical Exam: ?General: NAD, resting quietly  ?Head: Normocephalic, atraumatic. Moist oral mucosal membranes  ?Eyes: Anicteric  ?Lungs:  Clear to auscultation, normal effort, [place] O2  ?Heart: Regular rate and rhythm  ?Abdomen:  Soft, nontender  ?Extremities: No peripheral edema.  ?Neurologic: Nonfocal, moving all four extremities  ?Skin: Generalized ecchymosis  ?Access: Left hero graft, Rt Permcath  ? ? ?Basic Metabolic Panel: ?Recent Labs  ?Lab 08/24/21 ?1705 08/25/21 ?0144 08/25/21 ?2218 08/27/21 ?1531 08/29/21 ?1352  ?NA 136 134* 137 135 133*  ?K 3.9 4.1 4.3 3.8 3.9  ?CL 97* 99 102 99 98  ?CO2 22 22 19* 20* 21*  ?GLUCOSE 234* 266* 192* 193* 226*  ?BUN 35* 40* 50* 39* 32*  ?CREATININE 13.57* 14.52* 16.15* 14.54* 11.88*  ?CALCIUM 9.4 8.9 8.8* 8.6* 8.3*  ?MG 2.1  --  2.2  --   --    ?PHOS  --   --   --   --  4.6  ? ? ? ?Liver Function Tests: ?Recent Labs  ?Lab 08/24/21 ?1705 08/25/21 ?2218 08/29/21 ?1352  ?AST 15 15  --   ?ALT 9 9  --   ?ALKPHOS 77 74  --   ?BILITOT 0.5 0.4  --   ?PROT 8.6* 7.7  --   ?ALBUMIN 3.7 3.3* 3.3*  ? ? ?No results for input(s): LIPASE, AMYLASE in the last 168 hours. ?No results for input(s): AMMONIA in the last 168 hours. ? ?CBC: ?Recent Labs  ?Lab 08/24/21 ?1705 08/25/21 ?0144 08/25/21 ?2218 08/29/21 ?1352  ?WBC 12.1* 9.3 7.8 11.7*  ?NEUTROABS 10.4*  --   --   --   ?HGB 13.8 12.4* 12.3* 12.9*  ?HCT 46.3 40.5 40.4 42.2  ?MCV 91.7 89.4 90.6 90.9  ?PLT 168 177 215 198  ? ? ? ?Cardiac Enzymes: ?No results for input(s): CKTOTAL, CKMB, CKMBINDEX, TROPONINI in the last 168 hours. ? ?BNP: ?Invalid input(s): POCBNP ? ?CBG: ?Recent Labs  ?Lab 08/28/21 ?1619 08/28/21 ?2115 08/29/21 ?2229 08/29/21 ?1230 08/29/21 ?1646  ?GLUCAP 224* 362* 217* 226* 476*  ? ? ? ?Microbiology: ?Results for orders placed or performed during the hospital encounter of 08/24/21  ?Resp Panel by RT-PCR (Flu A&B, Covid) Nasopharyngeal Swab     Status: None  ? Collection Time: 08/24/21  6:22 PM  ? Specimen: Nasopharyngeal Swab; Nasopharyngeal(NP) swabs in vial transport medium  ?  Result Value Ref Range Status  ? SARS Coronavirus 2 by RT PCR NEGATIVE NEGATIVE Final  ?  Comment: (NOTE) ?SARS-CoV-2 target nucleic acids are NOT DETECTED. ? ?The SARS-CoV-2 RNA is generally detectable in upper respiratory ?specimens during the acute phase of infection. The lowest ?concentration of SARS-CoV-2 viral copies this assay can detect is ?138 copies/mL. A negative result does not preclude SARS-Cov-2 ?infection and should not be used as the sole basis for treatment or ?other patient management decisions. A negative result may occur with  ?improper specimen collection/handling, submission of specimen other ?than nasopharyngeal swab, presence of viral mutation(s) within the ?areas targeted by this assay, and inadequate number  of viral ?copies(<138 copies/mL). A negative result must be combined with ?clinical observations, patient history, and epidemiological ?information. The expected result is Negative. ? ?Fact Sheet for Patients:  ?EntrepreneurPulse.com.au ? ?Fact Sheet for Healthcare Providers:  ?IncredibleEmployment.be ? ?This test is no t yet approved or cleared by the [place] FDA and  ?has been authorized for detection and/or diagnosis of SARS-CoV-2 by ?FDA under an Emergency Use Authorization (EUA). This EUA will remain  ?in effect (meaning this test can be used) for the duration of the ?COVID-19 declaration under Section 564(b)(1) of the Act, 21 ?U.S.C.section 360bbb-3(b)(1), unless the authorization is terminated  ?or revoked sooner.  ? ? ?  ? Influenza A by PCR NEGATIVE NEGATIVE Final  ? Influenza B by PCR NEGATIVE NEGATIVE Final  ?  Comment: (NOTE) ?The Xpert Xpress SARS-CoV-2/FLU/RSV plus assay is intended as an aid ?in the diagnosis of influenza from Nasopharyngeal swab specimens and ?should not be used as a sole basis for treatment. Nasal washings and ?aspirates are unacceptable for Xpert Xpress SARS-CoV-2/FLU/RSV ?testing. ? ?Fact Sheet for Patients: ?EntrepreneurPulse.com.au ? ?Fact Sheet for Healthcare Providers: ?IncredibleEmployment.be ? ?This test is not yet approved or cleared by the [place] FDA and ?has been authorized for detection and/or diagnosis of SARS-CoV-2 by ?FDA under an Emergency Use Authorization (EUA). This EUA will remain ?in effect (meaning this test can be used) for the duration of the ?COVID-19 declaration under Section 564(b)(1) of the Act, 21 U.S.C. ?section 360bbb-3(b)(1), unless the authorization is terminated or ?revoked. ? ?Performed at [facility], [place], ?[place] [address] ?  ?MRSA Next Gen by PCR, Nasal     Status: None  ? Collection Time: 08/24/21 10:11 PM  ? Specimen: Nasal  Mucosa; Nasal Swab  ?Result Value Ref Range Status  ? MRSA by PCR Next Gen NOT DETECTED NOT DETECTED Final  ?  Comment: (NOTE) ?The GeneXpert MRSA Assay (FDA approved for NASAL specimens only), ?is one component of a comprehensive MRSA colonization surveillance ?program. It is not intended to diagnose MRSA infection nor to guide ?or monitor treatment for MRSA infections. ?Test performance is not FDA approved in patients less than 2 years ?old. ?Performed at [facility], [place], ?[place] [address] ?  ? ? ?Coagulation Studies: ?No results for input(s): LABPROT, INR in the last 72 hours. ? ?Urinalysis: ?No results for input(s): COLORURINE, LABSPEC, [place], GLUCOSEU, HGBUR, BILIRUBINUR, KETONESUR, PROTEINUR, UROBILINOGEN, NITRITE, LEUKOCYTESUR in the last 72 hours. ? ?Invalid input(s): APPERANCEUR  ? ? ?Imaging: ?[place] Dialysis Access ? ?Result Date: 08/28/2021 ?CLINICAL DATA:  Suspected occlusion left upper arm HeRO dialysis graft. EXAM: ULTRASOUND DIALYSIS ACCESS TECHNIQUE: Ultrasound was performed at the level the left upper extremity dialysis access beginning at the arterial anastomosis and continuing to the level of venous insertion. COMPARISON:  None  Available. FINDINGS: Native brachial artery demonstrates normal patency with some turbulence of flow at the level of the anastomosis with the graft component of the HeRO graft. The graft is occluded just beyond the arterial anastomosis. The graft is well visualized in the left upper arm and completely occluded with no abnormal focal perigraft fluid collections identified. Insertion site of the catheter portion into the left axillary/subclavian vein is visualized. The visualized catheter portion is occluded. Visualized axillary and subclavian veins demonstrate normal patency. IMPRESSION: Complete occlusion of the visualized segments of the HeRO graft in the left upper arm beginning just beyond the arterial anastomosis. No abnormal perigraft  fluid collections. Native brachial artery as well as the visualized axillary and subclavian veins demonstrate normal patency. Electronically Signed   By: Aletta Edouard M.D.   On: 08/28/2021 16:40  ? ?PERIPHE

## 2021-08-29 NOTE — Hospital Course (Addendum)
[name] [name] is an 46 y.o. male with HTN, poorly controlled DM 2, ESRD on HD MWF, CAD was recently seen in the ED 2 days ago with complaints of malaise, decreased p.o. intake with nausea and dizziness.  He was noted to be orthostatic at the time and was given gentle hydration and discharged home.  He came back to the hospital after syncope episode.  Patient also has been feeling dizzy since arriving the hospital. ?Had another episode of syncope on 5/1.  Coronary angiogram was performed 5/2 without significant occlusion. ?AV fistula graft thrombectomy was performed on 5/4. ?Patient is evaluated by physical therapy/Occupational Therapy, need nursing home placement.  Currently pending for bed ?

## 2021-08-29 NOTE — Op Note (Signed)
[facility] ? ? ? ?OPERATIVE NOTE ? ? ?PROCEDURE: ?1.  Left arm HeRO graft cannulation under ultrasound guidance in both a retrograde and then antegrade fashion crossing ?2.  Left arm shuntogram and central venogram ?3.  Catheter directed thrombolysis with 4 mg of TPA  ?4.  Mechanical thrombectomy to the HeRO graft with the Fogarty catheter for the arterial anastomosis and the cleaner catheter for the remainder of the graft ?5.  Fogarty embolectomy for residual arterial plug ? ? ?PRE-OPERATIVE DIAGNOSIS: 1. ESRD ?2.  Thrombosed left arm arm HeRO graft ? ?POST-OPERATIVE DIAGNOSIS: same as above  ? ?SURGEON: [name], MD ? ?ANESTHESIA: local with Moderate Conscious Sedation  ? ?ESTIMATED BLOOD LOSS: 15 cc ? ?FINDING(S): ?Thrombosed HeRO graft, opened with intervention ? ?SPECIMEN(S):  None ? ?CONTRAST: 20 cc ? ?FLUORO TIME: 3 minutes ? ?MODERATE CONSCIOUS SEDATION TIME: Approximately 31 minutes with 2 mg of Versed and 50 mcg of Fentanyl ? ?INDICATIONS: ?Patient is a 46 y.o.male who presents with a thrombosed left arm HeRO graft.  The patient is scheduled for an attempted declot and shuntogram.  The patient is aware the risks include but are not limited to: bleeding, infection, thrombosis of the cannulated access, and possible anaphylactic reaction to the contrast.  The patient is aware of the risks of the procedure and elects to proceed forward. ? ?DESCRIPTION: ?After full informed written consent was obtained, the patient was brought back to the angiography suite and placed supine upon the angiography table.  The patient was connected to monitoring equipment.  Moderate conscious sedation was administered during a face to face encounter with the patient throughout the procedure with my supervision of the RN administering medicines and monitoring the patient's vital signs and mental status throughout from the start of the procedure until the patient was taken to the recovery room.  The left arm  was prepped and draped in the standard fashion for a percutaneous access intervention.  Under ultrasound guidance, the left arm HeRO graft was cannulated with a micropuncture needle under direct ultrasound guidance due to the pulseless nature of the graft in both an antegrade and a retrograde fashion crossing, and permanent images were performed.  The microwire was advanced and the needle was exchanged for the a microsheath.  I then upsized to a 6 Fr Sheath and imaging was performed.  Hand injections were completed to image the access including the central venous system. This demonstrated significant thrombus with minimal flow within the HeRO graft.  Based on the images, this patient will need extensive treatment to salvage the graft. I then gave the patient 4000 units of intravenous heparin. ? ?I then placed a Magic torque wire into the brachial artery from the retrograde sheath. 4 mg of TPA were deployed. This was allowed to dwell. Mechanical thrombectomy was then performed throughout the PTFE portion of the HeRO graft using the cleaner catheter. This uncovered a residual arterial plug at the arterial anastomosis. An attempt to clear the arterial plug was done with 3 passes of the Fogarty embolectomy balloon.  The retrograde sheath was removed as a 4-0 Monocryl pursestring suture was placed. I then turned my attention to the thrombus in the distal graft. Mechanical thrombectomy was performed again using the cleaner device. This resulted in resolution of the thrombus with brisk flow through the graft.   ? ? ?Based on the completion imaging, no further intervention is necessary.  The wire and balloon were removed from the sheath.  A 4-0  Monocryl purse-string suture was sewn around the sheath.  The sheath was removed while tying down the suture.  A sterile bandage was applied to the puncture site. ? ?COMPLICATIONS: None ? ?CONDITION: Stable ? ? ?[name] ?08/29/2021 ?11:00 AM ? ? ?This note was created with Dragon  Medical transcription system. Any errors in dictation are purely unintentional.  ?

## 2021-08-29 NOTE — Progress Notes (Signed)
Inpatient Diabetes Program Recommendations ? ?AACE/ADA: New Consensus Statement on Inpatient Glycemic Control (2015) ? ?Target Ranges:  Prepandial:   less than 140 mg/dL ?     Peak postprandial:   less than 180 mg/dL (1-2 hours) ?     Critically ill patients:  140 - 180 mg/dL  ? ?Lab Results  ?Component Value Date  ? GLUCAP 226 (H) 08/29/2021  ? HGBA1C 10.0 (H) 08/24/2021  ? ? Latest Reference Range & Units 08/28/21 07:51 08/28/21 13:04 08/28/21 16:19 08/28/21 21:15 08/29/21 09:04 08/29/21 12:30  ?Glucose-Capillary 70 - 99 mg/dL 198 (H) 142 (H) 224 (H) 362 (H) 217 (H) 226 (H)  ? ? ?Review of Glycemic Control ? Latest Reference Range & Units 04/15/21 08:01 08/24/21 17:05  ?Hemoglobin A1C 4.8 - 5.6 % 9.7 (H) 10.0 (H)  ? ?Diabetes history: DM 2 ?Outpatient Diabetes medications: Humulin 70/30 Insulin 10 units (T, Thu, Fri, Sat. Sun) QAM/ 30 units QPM ?Current orders for Inpatient glycemic control:  ?70/30 Insulin 10 units (T, Thu, Fri, Sat. Sun) QAM/ 35 units QPM  ?Novolog 0-6 TID.  CBG 198 this AM ? ? ?Per Chart Review: Counseled by Diabetes Coordinator Apr 17, 2021 when A1c was 9.7%--Was taking the same regimen then--Also, looks like he went to the [facility] in June 2022 for Diet counseling and went back in Nov 2022 for education on application of the Freestyle CGM. ? ?Spoke with pt at bedside regarding A1c and glucose control at home. Pt reports he uses the [employer] every hour to check glucose trends at home. Pt also says that once in awhile he has issues with his glucose trends not being controlled. Pt sees his PCP for Diabetes management every 3 months. No changes have been made to his regimen recently. Pt reports last A1c check was around a 6-7%, however I did point out the end of last year when his A1c was 9.7%.  Encouraged pt follow up and when to call the doctor if trends are greater than 100 range. Spoke to pt about the viscosity of blood and vascular complications of uncontrolled glucose  trends.  ? ?Thanks, ? ?Tama Headings RN, MSN, BC-ADM ?Inpatient Diabetes Coordinator ?Team Pager [phone] (8a-5p) ?

## 2021-08-29 NOTE — Care Management Important Message (Signed)
Important Message ? ?Patient Details  ?Name: [name] [name] ?MRN: [id number] ?Date of Birth: [birth year] ? ? ?Medicare Important Message Given:  Yes ? ? ? ? ?Dannette Barbara ?08/29/2021, 1:09 PM ?

## 2021-08-29 NOTE — Progress Notes (Signed)
Hemodialysis patient known at [facility] MWF 6:00am. ?

## 2021-08-30 DIAGNOSIS — R55 Syncope and collapse: Secondary | ICD-10-CM | POA: Diagnosis not present

## 2021-08-30 DIAGNOSIS — N186 End stage renal disease: Secondary | ICD-10-CM | POA: Diagnosis not present

## 2021-08-30 DIAGNOSIS — I10 Essential (primary) hypertension: Secondary | ICD-10-CM | POA: Diagnosis not present

## 2021-08-30 DIAGNOSIS — Z992 Dependence on renal dialysis: Secondary | ICD-10-CM | POA: Diagnosis not present

## 2021-08-30 LAB — GLUCOSE, CAPILLARY
Glucose-Capillary: 348 mg/dL — ABNORMAL HIGH (ref 70–99)
Glucose-Capillary: 325 mg/dL — ABNORMAL HIGH (ref 70–99)
Glucose-Capillary: 388 mg/dL — ABNORMAL HIGH (ref 70–99)
Glucose-Capillary: 336 mg/dL — ABNORMAL HIGH (ref 70–99)

## 2021-08-30 LAB — RENAL FUNCTION PANEL
Albumin: 3.2 g/dL — ABNORMAL LOW (ref 3.5–5.0)
Anion gap: 16 — ABNORMAL HIGH (ref 5–15)
BUN: 51 mg/dL — ABNORMAL HIGH (ref 6–20)
CO2: 20 mmol/L — ABNORMAL LOW (ref 22–32)
Calcium: 9 mg/dL (ref 8.9–10.3)
Chloride: 94 mmol/L — ABNORMAL LOW (ref 98–111)
Creatinine, Ser: 13.93 mg/dL — ABNORMAL HIGH (ref 0.61–1.24)
GFR, Estimated: 4 mL/min — ABNORMAL LOW (ref >60)
Glucose, Bld: 422 mg/dL — ABNORMAL HIGH (ref 70–99)
Phosphorus: 7.2 mg/dL — ABNORMAL HIGH (ref 2.5–4.6)
Potassium: 4 mmol/L (ref 3.5–5.1)
Sodium: 130 mmol/L — ABNORMAL LOW (ref 135–145)

## 2021-08-30 LAB — CBC
HCT: 37.4 % — ABNORMAL LOW (ref 39.0–52.0)
Hemoglobin: 11.6 g/dL — ABNORMAL LOW (ref 13.0–17.0)
MCH: 27.6 pg (ref 26.0–34.0)
MCHC: 31 g/dL (ref 30.0–36.0)
MCV: 89 fL (ref 80.0–100.0)
Platelets: 200 10*3/uL (ref 150–400)
RBC: 4.2 MIL/uL — ABNORMAL LOW (ref 4.22–5.81)
RDW: 15.9 % — ABNORMAL HIGH (ref 11.5–15.5)
WBC: 13.8 10*3/uL — ABNORMAL HIGH (ref 4.0–10.5)
nRBC: 0 % (ref 0.0–0.2)

## 2021-08-30 NOTE — Progress Notes (Signed)
PT Cancellation Note ? ?Patient Details ?Name: [name] [name] ?MRN: [id number] ?DOB: June 03, 1975 ? ? ?Cancelled Treatment:    Reason Eval/Treat Not Completed: Patient at procedure or test/unavailable.  Per chart review, pt is currently at HD at this time.  Will re-attempt at later date/time as medically appropriate.   ? ? ?Gwenlyn Saran, PT, DPT ?08/30/21, 10:27 AM ? ?

## 2021-08-30 NOTE — Progress Notes (Signed)
?  Progress Note ? ? ?Patient: [name] [name] MGQ:[id number] DOB: 1976-02-23 DOA: 08/24/2021     6 ?DOS: the patient was seen and examined on 08/30/2021 ?  ?Brief hospital course: ?[name] [name] is an 46 y.o. male with HTN, poorly controlled DM 2, ESRD on HD MWF, CAD was recently seen in the ED 2 days ago with complaints of malaise, decreased p.o. intake with nausea and dizziness.  He was noted to be orthostatic at the time and was given gentle hydration and discharged home.  He came back to the hospital after syncope episode.  Patient also has been feeling dizzy since arriving the hospital. ?Had another episode of syncope on 5/1.  Coronary angiogram was performed 5/2 without significant occlusion. ?AV fistula graft thrombectomy was performed on 5/4. ? ?Assessment and Plan: ?Syncope secondary to vasovagal reaction. ?Orthostatic hypotension. ?Condition improved.  Patient was able to walk with the physical therapy, but significant weakness.  Currently pending for nursing home placement. ? ?Elevated troponin secondary to syncope. ?Possible SVT. ?Negative coronary angiogram. ? ?End-stage renal disease ?AV fistula thrombosis ?Followed by nephrology. ?Patient had an AV fistula thrombectomy performed 5/4. ?Patient was dialyzed again today ? ? ?Essential hypertension. ?Appear to be stable. ? ?Poorly controlled type 2 diabetes with hyperglycemia. ?Continue current regimen ?  ?Chest wall pain. ?Continue as needed pain medicine ? ? ? ?  ? ?Subjective:  ?Patient doing well today, chest pains seem to be better. ?Was able to walk with physical therapy yesterday. ? ?Physical Exam: ?Vitals:  ? 08/30/21 1115 08/30/21 1130 08/30/21 1145 08/30/21 1259  ?BP: 115/79 117/78 101/74 136/79  ?Pulse: 78 78 81 79  ?Resp: 11 11 18    ?Temp:      ?TempSrc:      ?SpO2:    99%  ?Weight:   126.9 kg   ?Height:      ? ?General exam: Appears calm and comfortable  ?Respiratory system: Clear to auscultation. Respiratory effort normal. ?Cardiovascular  system: S1 & S2 heard, RRR. No JVD, murmurs, rubs, gallops or clicks. No pedal edema. ?Gastrointestinal system: Abdomen is nondistended, soft and nontender. No organomegaly or masses felt. Normal bowel sounds heard. ?Central nervous system: Alert and oriented. No focal neurological deficits. ?Extremities: Symmetric 5 x 5 power. ?Skin: No rashes, lesions or ulcers ?Psychiatry: Judgement and insight appear normal. Mood & affect appropriate.  ? ?Data Reviewed: ? ?There are no new results to review at this time. ? ?Family Communication: Mother updated at bedside. ? ?Disposition: ?Status is: Inpatient ?Remains inpatient appropriate because: Unsafe discharge ? Planned Discharge Destination: Skilled nursing facility ? ? ? ?Time spent: 30 minutes ? ?Author: ?Sharen Hones, MD ?08/30/2021 2:06 PM ? ?For on call review [email or web address].  ?

## 2021-08-30 NOTE — Plan of Care (Signed)
?  Problem: Health Behavior/Discharge Planning: ?Goal: Ability to manage health-related needs will improve ?Outcome: Progressing ?  ?Problem: Clinical Measurements: ?Goal: Will remain free from infection ?Outcome: Progressing ?  ?Problem: Clinical Measurements: ?Goal: Respiratory complications will improve ?Outcome: Progressing ?  ?Problem: Clinical Measurements: ?Goal: Cardiovascular complication will be avoided ?Outcome: Progressing ?  ?Problem: Activity: ?Goal: Risk for activity intolerance will decrease ?Outcome: Progressing ?  ?Problem: Elimination: ?Goal: Will not experience complications related to bowel motility ?Outcome: Progressing ?  ?Problem: Pain Managment: ?Goal: General experience of comfort will improve ?Outcome: Progressing ?  ?

## 2021-08-30 NOTE — Progress Notes (Addendum)
Physical Therapy Treatment ?Patient Details ?Name: [name] [name] ?MRN: [id number] ?DOB: 04/06/1976 ?Today's Date: 08/30/2021 ? ? ?History of Present Illness 46 y.o. male with HTN, poorly controlled DM 2, ESRD on HD MWF, CAD was recently seen in the ED 2 days ago with complaints of malaise, decreased p.o. intake with nausea and dizziness.  He was noted to be orthostatic at the time and was given gentle hydration and discharged home.  He came back to the hospital after syncope episode.  Patient also has been feeling dizzy since arriving the hospital. Had another episode of syncope on 5/1.  Coronary angiogram was performed 5/2 without significant occlusion. AV fistula graft thrombectomy was performed on 5/4. ? ?  ?PT Comments  ? ? Pt seen for PT tx with pt agreeable to participation. Pt's mother present for session. Pt is able to complete bed mobility with mod I & STS with supervision with cuing for safe hand placement with RW. Pt dons shorts sitting/standing EOB with supervision. Pt is able to ambulate with RW & CGA so progressed to gait without AD as pt reports prior to admission pt was living alone, ambulatory without AD. Pt is able to ambulate much shorter distances without AD & min assist with noticeable BLE weakness & seated rest breaks PRN. Pt endorses lightheadedness during final gait trial & after ~3-4 minutes seated rest break BP in RUE 112/79 mmHg MAP 89. Pt left in recliner in care of nursing staff with mother present. ? ?Of note, discussed possibility of pt discharging home with HHPT if he had assistance but pt's mother reports she cannot live at his house & he cannot stay with her (she also lives on 2nd level apartment with flight of stairs to access). Pt would benefit from intensive level of therapy to maximize independence with mobility & decrease fall risk. Have updated d/c recommendations to CIR. Will continue to follow pt acutely to address balance, strength, and gait & stairs with LRAD. ? ?Addendum:  Per chart, pt not a candidate for CIR. Have updated d/c recommendations to reflect that (recommending STR with frequency of 2x/week). ? ?   ?Recommendations for follow up therapy are one component of a multi-disciplinary discharge planning process, led by the attending physician.  Recommendations may be updated based on patient status, additional functional criteria and insurance authorization. ? ?Follow Up Recommendations ? Skilled nursing-short term rehab (<3 hours/day) ?  ?  ?Assistance Recommended at Discharge Intermittent Supervision/Assistance  ?Patient can return home with the following A little help with walking and/or transfers;A little help with bathing/dressing/bathroom;Assistance with cooking/housework;Direct supervision/assist for financial management;Assist for transportation;Help with stairs or ramp for entrance ?  ?Equipment Recommendations ? Rolling walker (2 wheels)  ?  ?Recommendations for Other Services Rehab consult ? ? ?  ?Precautions / Restrictions Precautions ?Precautions: Fall ?Restrictions ?Weight Bearing Restrictions: No  ?  ? ?Mobility ? Bed Mobility ?Overal bed mobility: Modified Independent ?Bed Mobility: Supine to Sit ?  ?  ?Supine to sit: Modified independent (Device/Increase time), HOB elevated ?  ?  ?  ?  ? ?Transfers ?Overall transfer level: Needs assistance ?Equipment used: None, Rolling walker (2 wheels) ?Transfers: Sit to/from Stand ?Sit to Stand: Supervision ?  ?  ?  ?  ?  ?General transfer comment: cuing for safe hand placement when transferring STS with RW ?  ? ?Ambulation/Gait ?Ambulation/Gait assistance: Min guard, Min assist ?Gait Distance (Feet): 100 Feet (+ 25 ft + 40 ft) ?Assistive device: Rolling walker (2 wheels), None ?Gait Pattern/deviations:  Decreased stride length, Decreased step length - right, Decreased step length - left ?Gait velocity: decreased ?  ?  ?General Gait Details: unsteady; pt ambulates 100 ft with RW & CGA then additional gait trials without AD with  min assist ? ? ?Stairs ?  ?  ?  ?  ?  ? ? ?Wheelchair Mobility ?  ? ?Modified Rankin (Stroke Patients Only) ?  ? ? ?  ?Balance Overall balance assessment: Needs assistance ?Sitting-balance support: Feet supported, No upper extremity supported ?Sitting balance-Leahy Scale: Good ?Sitting balance - Comments: Pt able to thread shorts on BLE sitting EOB without assistance ?  ?Standing balance support: During functional activity, No upper extremity supported ?Standing balance-Leahy Scale: Poor ?  ?  ?  ?  ?  ?  ?  ?  ?  ?  ?  ?  ?  ? ?  ?Cognition Arousal/Alertness: Awake/alert ?Behavior During Therapy: [facility] for tasks assessed/performed ?Overall Cognitive Status: No family/caregiver present to determine baseline cognitive functioning ?  ?  ?  ?  ?  ?  ?  ?  ?  ?  ?  ?  ?  ?  ?  ?  ?General Comments: Pleasant man ?  ?  ? ?  ?Exercises   ? ?  ?General Comments   ?  ?  ? ?Pertinent Vitals/Pain Pain Assessment ?Pain Assessment: No/denies pain  ? ? ?Home Living   ?  ?  ?  ?  ?  ?  ?  ?  ?  ?   ?  ?Prior Function    ?  ?  ?   ? ?PT Goals (current goals can now be found in the care plan section) Acute Rehab PT Goals ?Patient Stated Goal: to get well ?PT Goal Formulation: With patient ?Time For Goal Achievement: 09/12/21 ?Potential to Achieve Goals: Good ?Progress towards PT goals: Progressing toward goals ? ?  ?Frequency ? ? ? 7X/week ? ? ? ?  ?PT Plan Discharge plan needs to be updated;Frequency needs to be updated  ? ? ?Co-evaluation   ?  ?  ?  ?  ? ?  ?AM-PAC PT "6 Clicks" Mobility   ?Outcome Measure ? Help needed turning from your back to your side while in a flat bed without using bedrails?: None ?Help needed moving from lying on your back to sitting on the side of a flat bed without using bedrails?: None ?Help needed moving to and from a bed to a chair (including a wheelchair)?: A Little ?Help needed standing up from a chair using your arms (e.g., wheelchair or bedside chair)?: A Little ?Help needed to walk in hospital  room?: A Little ?Help needed climbing 3-5 steps with a railing? : A Lot ?6 Click Score: 19 ? ?  ?End of Session   ?Activity Tolerance: Treatment limited secondary to medical complications (Comment) ?Patient left: in chair;with call bell/phone within reach;with family/visitor present;with nursing/sitter in room ?Nurse Communication: Mobility status ?PT Visit Diagnosis: Unsteadiness on feet (R26.81);Muscle weakness (generalized) (M62.81);Repeated falls (R29.6) ?  ? ? ?Time: 5102-5852 ?PT Time Calculation (min) (ACUTE ONLY): 23 min ? ?Charges:  $Therapeutic Activity: 23-37 mins          ?          ? ?Lavone Nian, PT, DPT ?08/30/21, 3:44 PM ? ? ? ?[name] [name] ?08/30/2021, 3:30 PM ? ?

## 2021-08-30 NOTE — NC FL2 (Signed)
?[place] MEDICAID FL2 LEVEL OF CARE SCREENING TOOL  ?  ? ?IDENTIFICATION  ?Patient Name: ?[name] [name] Birthdate: 10-16-1975 Sex: male Admission Date (Current Location): ?08/24/2021  ?[place] and [place] Number: ? [place] ?  Facility and Address:  ?[facility], [address], [place], [place] [address] ?     Provider Number: ?[id number]  ?Attending Physician Name and Address:  ?Sharen Hones, MD ? Relative Name and Phone Number:  ?Constance Holster [phone] ?   ?Current Level of Care: ?Hospital Recommended Level of Care: ?[place] Prior Approval Number: ?  ? ?Date Approved/Denied: ?  PASRR Number: ?[id number] A ? ?Discharge Plan: ?SNF ?  ? ?Current Diagnoses: ?Patient Active Problem List  ? Diagnosis Date Noted  ? Elevated troponin 08/25/2021  ? Poorly controlled type 2 diabetes mellitus ([place]) 08/25/2021  ? Leukocytosis 08/25/2021  ? Syncope 08/24/2021  ? 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 Coronary artery disease   ? Obesity, Class III, BMI 40-49.9 (morbid obesity) ([place])   ? Chronic venous insufficiency 02/17/2017  ? Hypertension 07/24/2016  ? Venous ulcer of left leg ([place]) 07/24/2016  ? Uncontrolled type 2 diabetes mellitus with hyperglycemia, with long-term current use of insulin ([place]) 11/27/2015  ? ESRD on hemodialysis ([place]) - M, W, F via LUE AV fistula 11/02/2014  ? Diabetes mellitus with ESRD (end-stage renal disease) ([place]) 11/02/2014  ? ? ?Orientation RESPIRATION BLADDER Height & Weight   ?  ?Self, Time, Situation, Place ? Normal Continent Weight: 279 lb 12.2 oz (126.9 kg) ?Height:  6\' 1"  (185.4 cm)  ?BEHAVIORAL SYMPTOMS/MOOD NEUROLOGICAL BOWEL NUTRITION STATUS  ?    Continent Diet (see discharge summary)  ?AMBULATORY STATUS COMMUNICATION OF NEEDS Skin   ?Limited Assist Verbally Other (Comment) (left shoulder closed incision) ?  ?  ?  ?    ?     ?     ? ? ?Personal Care Assistance Level of Assistance  ?Bathing, Dressing, Total care, Feeding Bathing Assistance:  Limited assistance ?Feeding assistance: Independent ?Dressing Assistance: Limited assistance ?Total Care Assistance: Limited assistance  ? ?Functional Limitations Info  ?Sight, Speech, Hearing Sight Info: Impaired ?Hearing Info: Adequate ?Speech Info: Adequate  ? ? ?SPECIAL CARE FACTORS FREQUENCY  ?PT (By licensed PT), OT (By licensed OT)   ?  ?PT Frequency: min 4x weekly ?OT Frequency: min 4x weekly ?  ?  ?  ?   ? ? ?Contractures Contractures Info: Not present  ? ? ?Additional Factors Info  ?Code Status, Allergies Code Status Info: full ?Allergies Info: cephaliexin, ivp dye (iodinated contrast media), lisinopril ?  ?  ?  ?   ? ?Current Medications (08/30/2021):  This is the current hospital active medication list ?Current Facility-Administered Medications  ?Medication Dose Route Frequency Provider Last Rate Last Admin  ? 0.9 %  sodium chloride infusion  250 mL Intravenous PRN Algernon Huxley, MD 250 mL/hr at 08/28/21 0448 250 mL/hr at 08/28/21 0448  ? acetaminophen (TYLENOL) tablet 650 mg  650 mg Oral Q6H PRN Algernon Huxley, MD      ? Or  ? acetaminophen (TYLENOL) suppository 650 mg  650 mg Rectal Q6H PRN Algernon Huxley, MD      ? aspirin EC tablet 81 mg  81 mg Oral q1800 Algernon Huxley, MD   81 mg at 08/29/21 1707  ? bisacodyl (DULCOLAX) EC tablet 5 mg  5 mg Oral Daily PRN Algernon Huxley, MD      ? carvedilol (COREG) tablet 25 mg  25 mg Oral BID Algernon Huxley, MD   25 mg at 08/30/21 1300  ? Chlorhexidine Gluconate Cloth 2 % PADS 6 each  6 each Topical Daily Algernon Huxley, MD   6 each at 08/30/21 0900  ? cinacalcet (SENSIPAR) tablet 60 mg  60 mg Oral Q breakfast Algernon Huxley, MD   60 mg at 08/30/21 0859  ? heparin injection 5,000 Units  5,000 Units Subcutaneous Q8H Algernon Huxley, MD   5,000 Units at 08/30/21 1300  ? HYDROcodone-acetaminophen (NORCO/VICODIN) 5-325 MG per tablet 2 tablet  2 tablet Oral Q6H PRN Algernon Huxley, MD   2 tablet at 08/27/21 2012  ? HYDROmorphone (DILAUDID) injection 1 mg  1 mg Intravenous Once PRN Algernon Huxley, MD      ? hydrOXYzine (ATARAX) tablet 25 mg  25 mg Oral QHS Algernon Huxley, MD   25 mg at 08/29/21 2149  ? insulin aspart (novoLOG) injection 0-6 Units  0-6 Units Subcutaneous TID WC Algernon Huxley, MD   4 Units at 08/30/21 1300  ? insulin aspart protamine- aspart (NOVOLOG MIX 70/30) injection 10 Units  10 Units Subcutaneous Q T,Th,S,Su Algernon Huxley, MD   10 Units at 08/29/21 0935  ? insulin aspart protamine- aspart (NOVOLOG MIX 70/30) injection 35 Units  35 Units Subcutaneous Q supper Algernon Huxley, MD   35 Units at 08/29/21 1706  ? loperamide (IMODIUM) capsule 2 mg  2 mg Oral PRN Algernon Huxley, MD   2 mg at 08/25/21 1843  ? losartan (COZAAR) tablet 50 mg  50 mg Oral Daily Algernon Huxley, MD   50 mg at 08/30/21 1300  ? ondansetron (ZOFRAN) injection 4 mg  4 mg Intravenous Q6H PRN Algernon Huxley, MD      ? pantoprazole (PROTONIX) EC tablet 40 mg  40 mg Oral Daily Algernon Huxley, MD   40 mg at 08/30/21 1300  ? pneumococcal 20-valent conjugate vaccine (PREVNAR 20) injection 0.5 mL  0.5 mL Intramuscular Tomorrow-1000 Dew, Erskine Squibb, MD      ? polyethylene glycol (MIRALAX / GLYCOLAX) packet 17 g  17 g Oral Daily PRN Algernon Huxley, MD      ? sevelamer carbonate (RENVELA) powder PACK 2.4 g  2.4 g Oral TID WC Algernon Huxley, MD   2.4 g at 08/30/21 0858  ? sodium chloride 0.9 % bolus 250 mL  250 mL Intravenous Once Algernon Huxley, MD      ? sodium chloride flush (NS) 0.9 % injection 3 mL  3 mL Intravenous Q12H Algernon Huxley, MD   3 mL at 08/29/21 2200  ? sodium chloride flush (NS) 0.9 % injection 3 mL  3 mL Intravenous Q12H Algernon Huxley, MD   3 mL at 08/29/21 2200  ? sodium chloride flush (NS) 0.9 % injection 3 mL  3 mL Intravenous PRN Algernon Huxley, MD      ? ? ? ?Discharge Medications: ?Please see discharge summary for a list of discharge medications. ? ?Relevant Imaging Results: ? ?Relevant Lab Results: ? ? ?Additional Information ?SSN:[id number] ? ?Alberteen Sam, LCSW ? ? ? ? ?

## 2021-08-30 NOTE — Progress Notes (Signed)
Inpatient Rehab Admissions Coordinator:  ? ?Per updated PT recommendations pt was screened for CIR by Shann Medal, PT, DPT.  Unfortunately, pt does not demonstrate the medical necessity required by [facility] Medicare for prior authorization.  Will have to seek other venues for rehab.   ? ?Shann Medal, PT, DPT ?Admissions Coordinator ?[phone] ?08/30/21  ?3:42 PM ? ?

## 2021-08-30 NOTE — Progress Notes (Signed)
?[facility]  ?ROUNDING NOTE  ? ?Subjective:  ? ?[name] [name] is a 46 y.o. male with past medical history of diabetes, CAD, hypertension and ESRD on dialysis. Patient presents to ED via EMS after syncopal episodes. Patient has been admitted for Syncope [R55] ? ?Patient is known to our practice from previous admissions and receives outpatient dialysis treatments at [facility], supervised by [facility] physicians. ? ?Update ?Patient seen and evaluated during dialysis ?  ?HEMODIALYSIS FLOWSHEET: ? ?Blood Flow Rate (mL/min): 400 mL/min ?Arterial Pressure (mmHg): -220 mmHg ?Venous Pressure (mmHg): 160 mmHg ?Transmembrane Pressure (mmHg): 60 mmHg ?Ultrafiltration Rate (mL/min): 660 mL/min ?Dialysate Flow Rate (mL/min): 500 ml/min ?Conductivity: Machine : 13.7 ?Conductivity: Machine : 13.7 ?Dialysis Fluid Bolus: Normal Saline ?Bolus Amount (mL): 250 mL ? ?No complaints at this time ? ?Objective:  ?Vital signs in last 24 hours:  ?Temp:  [97.8 ?F (36.6 ?C)-98.9 ?F (37.2 ?C)] 97.8 ?F (36.6 ?C) (05/05 5277) ?Pulse Rate:  [77-88] 79 (05/05 1259) ?Resp:  [11-20] 18 (05/05 1145) ?BP: (101-163)/(68-89) 136/79 (05/05 1259) ?SpO2:  [93 %-99 %] 99 % (05/05 1259) ?Weight:  [126.9 kg-127.6 kg] 126.9 kg (05/05 1145) ? ?Weight change: -0.758 kg ?Filed Weights  ? 08/30/21 0018 08/30/21 0926 08/30/21 1145  ?Weight: 127.6 kg 127.1 kg 126.9 kg  ? ? ?Intake/Output: ?I/O last 3 completed shifts: ?In: 1260 [P.O.:960; I.V.:300] ?Out: -  ?  ?Intake/Output this shift: ? Total I/O ?In: -  ?Out: 848 [Other:848] ? ?Physical Exam: ?General: NAD, resting quietly  ?Head: Normocephalic, atraumatic. Moist oral mucosal membranes  ?Eyes: Anicteric  ?Lungs:  Clear to auscultation, normal effort, [place] O2  ?Heart: Regular rate and rhythm  ?Abdomen:  Soft, nontender  ?Extremities: No peripheral edema.  ?Neurologic: Nonfocal, moving all four extremities  ?Skin: Generalized ecchymosis  ?Access: Left hero graft, Rt Permcath  ? ? ?Basic Metabolic  Panel: ?Recent Labs  ?Lab 08/24/21 ?1705 08/25/21 ?0144 08/25/21 ?2218 08/27/21 ?1531 08/29/21 ?1352 08/30/21 ?0900  ?NA 136 134* 137 135 133* 130*  ?K 3.9 4.1 4.3 3.8 3.9 4.0  ?CL 97* 99 102 99 98 94*  ?CO2 22 22 19* 20* 21* 20*  ?GLUCOSE 234* 266* 192* 193* 226* 422*  ?BUN 35* 40* 50* 39* 32* 51*  ?CREATININE 13.57* 14.52* 16.15* 14.54* 11.88* 13.93*  ?CALCIUM 9.4 8.9 8.8* 8.6* 8.3* 9.0  ?MG 2.1  --  2.2  --   --   --   ?PHOS  --   --   --   --  4.6 7.2*  ? ? ? ?Liver Function Tests: ?Recent Labs  ?Lab 08/24/21 ?1705 08/25/21 ?2218 08/29/21 ?1352 08/30/21 ?0900  ?AST 15 15  --   --   ?ALT 9 9  --   --   ?ALKPHOS 77 74  --   --   ?BILITOT 0.5 0.4  --   --   ?PROT 8.6* 7.7  --   --   ?ALBUMIN 3.7 3.3* 3.3* 3.2*  ? ? ?No results for input(s): LIPASE, AMYLASE in the last 168 hours. ?No results for input(s): AMMONIA in the last 168 hours. ? ?CBC: ?Recent Labs  ?Lab 08/24/21 ?1705 08/25/21 ?0144 08/25/21 ?2218 08/29/21 ?1352 08/30/21 ?8242  ?WBC 12.1* 9.3 7.8 11.7* 13.8*  ?NEUTROABS 10.4*  --   --   --   --   ?HGB 13.8 12.4* 12.3* 12.9* 11.6*  ?HCT 46.3 40.5 40.4 42.2 37.4*  ?MCV 91.7 89.4 90.6 90.9 89.0  ?PLT 168 177 215 198 200  ? ? ? ?  Cardiac Enzymes: ?No results for input(s): CKTOTAL, CKMB, CKMBINDEX, TROPONINI in the last 168 hours. ? ?BNP: ?Invalid input(s): POCBNP ? ?CBG: ?Recent Labs  ?Lab 08/29/21 ?1230 08/29/21 ?1646 08/29/21 ?2358 08/30/21 ?3875 08/30/21 ?1237  ?GLUCAP 226* 476* 224* 348* 325*  ? ? ? ?Microbiology: ?Results for orders placed or performed during the hospital encounter of 08/24/21  ?Resp Panel by RT-PCR (Flu A&B, Covid) Nasopharyngeal Swab     Status: None  ? Collection Time: 08/24/21  6:22 PM  ? Specimen: Nasopharyngeal Swab; Nasopharyngeal(NP) swabs in vial transport medium  ?Result Value Ref Range Status  ? SARS Coronavirus 2 by RT PCR NEGATIVE NEGATIVE Final  ?  Comment: (NOTE) ?SARS-CoV-2 target nucleic acids are NOT DETECTED. ? ?The SARS-CoV-2 RNA is generally detectable in upper  respiratory ?specimens during the acute phase of infection. The lowest ?concentration of SARS-CoV-2 viral copies this assay can detect is ?138 copies/mL. A negative result does not preclude SARS-Cov-2 ?infection and should not be used as the sole basis for treatment or ?other patient management decisions. A negative result may occur with  ?improper specimen collection/handling, submission of specimen other ?than nasopharyngeal swab, presence of viral mutation(s) within the ?areas targeted by this assay, and inadequate number of viral ?copies(<138 copies/mL). A negative result must be combined with ?clinical observations, patient history, and epidemiological ?information. The expected result is Negative. ? ?Fact Sheet for Patients:  ?EntrepreneurPulse.com.au ? ?Fact Sheet for Healthcare Providers:  ?IncredibleEmployment.be ? ?This test is no t yet approved or cleared by the [place] FDA and  ?has been authorized for detection and/or diagnosis of SARS-CoV-2 by ?FDA under an Emergency Use Authorization (EUA). This EUA will remain  ?in effect (meaning this test can be used) for the duration of the ?COVID-19 declaration under Section 564(b)(1) of the Act, 21 ?U.S.C.section 360bbb-3(b)(1), unless the authorization is terminated  ?or revoked sooner.  ? ? ?  ? Influenza A by PCR NEGATIVE NEGATIVE Final  ? Influenza B by PCR NEGATIVE NEGATIVE Final  ?  Comment: (NOTE) ?The Xpert Xpress SARS-CoV-2/FLU/RSV plus assay is intended as an aid ?in the diagnosis of influenza from Nasopharyngeal swab specimens and ?should not be used as a sole basis for treatment. Nasal washings and ?aspirates are unacceptable for Xpert Xpress SARS-CoV-2/FLU/RSV ?testing. ? ?Fact Sheet for Patients: ?EntrepreneurPulse.com.au ? ?Fact Sheet for Healthcare Providers: ?IncredibleEmployment.be ? ?This test is not yet approved or cleared by the [place] FDA and ?has been  authorized for detection and/or diagnosis of SARS-CoV-2 by ?FDA under an Emergency Use Authorization (EUA). This EUA will remain ?in effect (meaning this test can be used) for the duration of the ?COVID-19 declaration under Section 564(b)(1) of the Act, 21 U.S.C. ?section 360bbb-3(b)(1), unless the authorization is terminated or ?revoked. ? ?Performed at [facility], [place], ?[place] [address] ?  ?MRSA Next Gen by PCR, Nasal     Status: None  ? Collection Time: 08/24/21 10:11 PM  ? Specimen: Nasal Mucosa; Nasal Swab  ?Result Value Ref Range Status  ? MRSA by PCR Next Gen NOT DETECTED NOT DETECTED Final  ?  Comment: (NOTE) ?The GeneXpert MRSA Assay (FDA approved for NASAL specimens only), ?is one component of a comprehensive MRSA colonization surveillance ?program. It is not intended to diagnose MRSA infection nor to guide ?or monitor treatment for MRSA infections. ?Test performance is not FDA approved in patients less than 2 years ?old. ?Performed at [facility], [place], ?[place] [address] ?  ? ? ?  Coagulation Studies: ?No results for input(s): LABPROT, INR in the last 72 hours. ? ?Urinalysis: ?No results for input(s): COLORURINE, LABSPEC, [place], GLUCOSEU, HGBUR, BILIRUBINUR, KETONESUR, PROTEINUR, UROBILINOGEN, NITRITE, LEUKOCYTESUR in the last 72 hours. ? ?Invalid input(s): APPERANCEUR  ? ? ?Imaging: ?[place] Dialysis Access ? ?Result Date: 08/28/2021 ?CLINICAL DATA:  Suspected occlusion left upper arm HeRO dialysis graft. EXAM: ULTRASOUND DIALYSIS ACCESS TECHNIQUE: Ultrasound was performed at the level the left upper extremity dialysis access beginning at the arterial anastomosis and continuing to the level of venous insertion. COMPARISON:  None Available. FINDINGS: Native brachial artery demonstrates normal patency with some turbulence of flow at the level of the anastomosis with the graft component of the HeRO graft. The graft is occluded just beyond the arterial  anastomosis. The graft is well visualized in the left upper arm and completely occluded with no abnormal focal perigraft fluid collections identified. Insertion site of the catheter portion into the left axillary/su

## 2021-08-30 NOTE — Progress Notes (Signed)
OT Cancellation Note ? ?Patient Details ?Name: [name] [name] ?MRN: [id number] ?DOB: May 07, 1975 ? ? ?Cancelled Treatment:    Reason Eval/Treat Not Completed: Patient at procedure or test/ unavailable. Upon attempt, pt out of the room. Chart reviewed, pt at dialysis. Will re-attempt at later datetime.  ? ?Ardeth Perfect., MPH, MS, OTR/L ?ascom [phone] ?08/30/21, 9:46 AM ? ?

## 2021-08-30 NOTE — Progress Notes (Signed)
Inpatient Diabetes Program Recommendations ? ?AACE/ADA: New Consensus Statement on Inpatient Glycemic Control (2015) ? ?Target Ranges:  Prepandial:   less than 140 mg/dL ?     Peak postprandial:   less than 180 mg/dL (1-2 hours) ?     Critically ill patients:  140 - 180 mg/dL  ? ?Lab Results  ?Component Value Date  ? GLUCAP 348 (H) 08/30/2021  ? HGBA1C 10.0 (H) 08/24/2021  ? ? Latest Reference Range & Units 08/28/21 07:51 08/28/21 13:04 08/28/21 16:19 08/28/21 21:15  ?Glucose-Capillary 70 - 99 mg/dL 198 (H) 142 (H) 224 (H) 362 (H)  ?Received Dialysis 5/3 ? ? Latest Reference Range & Units 08/29/21 09:04 08/29/21 12:30 08/29/21 16:46 08/29/21 23:58  ?Glucose-Capillary 70 - 99 mg/dL 217 (H) 226 (H) 476 (H) 224 (H)  ? ? Latest Reference Range & Units 08/30/21 08:51  ?Glucose-Capillary 70 - 99 mg/dL 348 (H)  ?Receive Dialysis today ? ?Diabetes history: DM 2 ?Outpatient Diabetes medications: Humulin 70/30 Insulin 10 units (T, Thu, Fri, Sat. Sun) QAM/ 30 units QPM ?Current orders for Inpatient glycemic control:  ?70/30 Insulin 10 units (T, Thu, Fri, Sat. Sun) QAM/ 35 units QPM  ?Novolog 0-6 TID.  CBG 198 this AM ? ?Glucose pattern not consistent, pt on different insulin regimen throughout the week depending on HD days ? ?Consider the following: ?- Add Novolog hs scale ?(May help with fasting glucose trends) ? ? ?Thanks, ? ?Tama Headings RN, MSN, BC-ADM ?Inpatient Diabetes Coordinator ?Team Pager [phone] (8a-5p) ?

## 2021-08-30 NOTE — Progress Notes (Signed)
Hemodialysis Post Treatment Note ?Access: RIJ Catheter  ?UF Removed: 848 ml ?Treatment Hours: 3 Hours; Actual 2 Hours ?Next Scheduled Treatment: 09/02/21 ?Note: ?Patient completes hemodialysis treatment, goals unmet. Patient reaches a UF of 848 ml due high venous pressures, possibility of clotting dialyzer, with unsuccessful interventions. RIJ catheter intact, no signs of infection, dressing changed at the request of patient. Patient transported to assigned room, report given to primary nurse.  ?

## 2021-08-30 NOTE — Plan of Care (Signed)

## 2021-08-30 NOTE — TOC Progression Note (Signed)
Transition of Care (TOC) - Progression Note  ? ? ?Patient Details  ?Name: [name] [name] ?MRN: [id number] ?Date of Birth: Nov 14, 1975 ? ?Transition of Care (TOC) CM/SW Contact  ?Laurena Slimmer, RN ?Phone Number: ?08/30/2021, 3:53 PM ? ?Clinical Narrative:    ?Spoke with patient and his mother at bedside. Patient stated he eventually wants to return home but is agreeable to SNF. Patient choice is [facility]. Is agreeable to other local SNF's if [facility]  does not offer a bed. Advised patient and family bed offer is contingent on facility and approval from insurance. Patient and mother stated understanding. PASSAR obtained, FL2 completed, bed search for [facility] and area SNF's started. ? ? ?Expected Discharge Plan: [place] ?Barriers to Discharge: Continued Medical Work up ? ?Expected Discharge Plan and Services ?Expected Discharge Plan: [place] ?  ?  ?  ?Living arrangements for the past 2 months: [place] ?                ?  ?  ?  ?  ?  ?  ?  ?  ?  ?  ? ? ?Social Determinants of Health (SDOH) Interventions ?  ? ?Readmission Risk Interventions ?   ? View : No data to display.  ?  ?  ?  ? ? ?

## 2021-08-31 DIAGNOSIS — R55 Syncope and collapse: Secondary | ICD-10-CM | POA: Diagnosis not present

## 2021-08-31 DIAGNOSIS — E1165 Type 2 diabetes mellitus with hyperglycemia: Secondary | ICD-10-CM | POA: Diagnosis not present

## 2021-08-31 DIAGNOSIS — Z992 Dependence on renal dialysis: Secondary | ICD-10-CM | POA: Diagnosis not present

## 2021-08-31 DIAGNOSIS — N186 End stage renal disease: Secondary | ICD-10-CM | POA: Diagnosis not present

## 2021-08-31 LAB — GLUCOSE, CAPILLARY
Glucose-Capillary: 253 mg/dL — ABNORMAL HIGH (ref 70–99)
Glucose-Capillary: 202 mg/dL — ABNORMAL HIGH (ref 70–99)
Glucose-Capillary: 97 mg/dL (ref 70–99)
Glucose-Capillary: 182 mg/dL — ABNORMAL HIGH (ref 70–99)

## 2021-08-31 MED ORDER — LOPERAMIDE HCL 2 MG PO CAPS
2.0000 mg | ORAL_CAPSULE | Freq: Two times a day (BID) | ORAL | Status: DC
  Administered 2021-08-31 (×2): 2 mg via ORAL
  Filled 2021-08-31 (×3): qty 1

## 2021-08-31 MED ORDER — WARFARIN - PHYSICIAN DOSING INPATIENT: Freq: Every day | Status: DC

## 2021-08-31 MED ORDER — INSULIN ASPART PROT & ASPART (70-30 MIX) 100 UNIT/ML ~~LOC~~ SUSP
25.0000 [IU] | Freq: Two times a day (BID) | SUBCUTANEOUS | Status: DC
  Administered 2021-08-31 – 2021-09-01 (×2): 25 [IU] via SUBCUTANEOUS
  Filled 2021-08-31: qty 10

## 2021-08-31 MED ORDER — WARFARIN SODIUM 2 MG PO TABS
2.0000 mg | ORAL_TABLET | Freq: Every day | ORAL | Status: AC
  Administered 2021-08-31: 2 mg via ORAL
  Filled 2021-08-31: qty 1

## 2021-08-31 MED ORDER — INSULIN ASPART PROT & ASPART (70-30 MIX) 100 UNIT/ML ~~LOC~~ SUSP
35.0000 [IU] | Freq: Two times a day (BID) | SUBCUTANEOUS | Status: DC
  Filled 2021-08-31: qty 10

## 2021-08-31 NOTE — Progress Notes (Signed)
Physical Therapy Treatment ?Patient Details ?Name: [name] [name] ?MRN: [id number] ?DOB: [birth year] ?Today's Date: 08/31/2021 ? ? ?History of Present Illness 46 y.o. male with HTN, poorly controlled DM 2, ESRD on HD MWF, CAD was recently seen in the ED 2 days ago with complaints of malaise, decreased p.o. intake with nausea and dizziness.  He was noted to be orthostatic at the time and was given gentle hydration and discharged home.  He came back to the hospital after syncope episode.  Patient also has been feeling dizzy since arriving the hospital. Had another episode of syncope on 5/1.  Coronary angiogram was performed 5/2 without significant occlusion. AV fistula graft thrombectomy was performed on 5/4. ? ?  ?PT Comments  ? ? Pt was sitting in recliner with blankets pulled over his head upon arriving. He agrees to session with encouragement. He was easily able to stand and ambulate with RW without LOB however when AD removed and ambulated without UE support, has 2 occasions of scissoring with intervention to prevent fall. Author highly recommends use of RW at all times. He continues to progress very quickly towards PT goals. May not need rehab at DC. PT will continue to monitor and advance as able.  ?  ?Recommendations for follow up therapy are one component of a multi-disciplinary discharge planning process, led by the attending physician.  Recommendations may be updated based on patient status, additional functional criteria and insurance authorization. ? ?Follow Up Recommendations ? Follow physician's recommendations for discharge plan and follow up therapies (pt may be too high functioning for rehab. Will continue to closely monitor progress in future sessions.) ?  ?  ?Assistance Recommended at Discharge PRN  ?Patient can return home with the following A little help with walking and/or transfers;A little help with bathing/dressing/bathroom;Assistance with cooking/housework;Direct supervision/assist for  financial management;Assist for transportation;Help with stairs or ramp for entrance ?  ?Equipment Recommendations ? Rolling walker (2 wheels) (Pt's personal RW has been already been delivered to room) ?  ?   ?Precautions / Restrictions Precautions ?Precautions: Fall ?Restrictions ?Weight Bearing Restrictions: No  ?  ? ?Mobility ? Bed Mobility  ?General bed mobility comments: in recliner pre/post session ?  ? ?Transfers ?Overall transfer level: Needs assistance ?Equipment used: None, Rolling walker (2 wheels) ?Transfers: Sit to/from Stand ?Sit to Stand: Supervision ?   ?General transfer comment: pt was able to stand to RW from recliner without physical assistance or Vcs ?  ? ?Ambulation/Gait ?Ambulation/Gait assistance: Supervision, Min guard ?Gait Distance (Feet): 400 Feet ?Assistive device: Rolling walker (2 wheels), None ?  ?Gait velocity: WNL ?  ?  ?General Gait Details: Pt was easilya nd safely able to ambulate 200 ft with RW without LOB. He also ambulated 200 ft without AD but has 2 occasions of scissoring in which intervention was required to prevent fall. ?  ?Balance Overall balance assessment: Needs assistance ?Sitting-balance support: Feet supported, No upper extremity supported ?Sitting balance-Leahy Scale: Good ?  ?  ?Standing balance support: During functional activity, No upper extremity supported ?Standing balance-Leahy Scale: Good ?  ?  ?  ?Cognition Arousal/Alertness: Awake/alert ?Behavior During Therapy: [facility] for tasks assessed/performed ?Overall Cognitive Status: Within Functional Limits for tasks assessed ?  ?  ?  ?  ?  ?   ?   ? ?Pertinent Vitals/Pain Pain Assessment ?Pain Assessment: No/denies pain  ? ? ? ?PT Goals (current goals can now be found in the care plan section) Acute Rehab PT Goals ?Patient Stated Goal: to get  well ?Progress towards PT goals: Progressing toward goals ? ?  ?Frequency ? ? ? Min 2X/week ? ? ? ?  ?PT Plan Discharge plan needs to be updated  ? ? ?Co-evaluation   ?  ?PT  goals addressed during session: Mobility/safety with mobility;Balance;Proper use of DME;Strengthening/ROM ?  ?  ? ?  ?AM-PAC PT "6 Clicks" Mobility   ?Outcome Measure ? Help needed turning from your back to your side while in a flat bed without using bedrails?: None ?Help needed moving from lying on your back to sitting on the side of a flat bed without using bedrails?: None ?Help needed moving to and from a bed to a chair (including a wheelchair)?: A Little ?Help needed standing up from a chair using your arms (e.g., wheelchair or bedside chair)?: A Little ?Help needed to walk in hospital room?: A Little ?Help needed climbing 3-5 steps with a railing? : A Little ?6 Click Score: 20 ? ?  ?End of Session   ?Activity Tolerance: Patient tolerated treatment well ?Patient left: in chair;with call bell/phone within reach;with family/visitor present;with nursing/sitter in room ?Nurse Communication: Mobility status ?PT Visit Diagnosis: Unsteadiness on feet (R26.81);Muscle weakness (generalized) (M62.81);Repeated falls (R29.6) ?  ? ? ?Time: 0300-9233 ?PT Time Calculation (min) (ACUTE ONLY): 21 min ? ?Charges:  $Gait Training: 8-22 mins          ?          ? ?Julaine Fusi PTA ?08/31/21, 4:03 PM  ? ?

## 2021-08-31 NOTE — Progress Notes (Signed)
?  Progress Note ? ? ?Patient: [name] [name] BHA:[id number] DOB: May 26, 1975 DOA: 08/24/2021     7 ?DOS: the patient was seen and examined on 08/31/2021 ?  ?Brief hospital course: ?[name] [name] is an 46 y.o. male with HTN, poorly controlled DM 2, ESRD on HD MWF, CAD was recently seen in the ED 2 days ago with complaints of malaise, decreased p.o. intake with nausea and dizziness.  He was noted to be orthostatic at the time and was given gentle hydration and discharged home.  He came back to the hospital after syncope episode.  Patient also has been feeling dizzy since arriving the hospital. ?Had another episode of syncope on 5/1.  Coronary angiogram was performed 5/2 without significant occlusion. ?AV fistula graft thrombectomy was performed on 5/4. ?Patient is evaluated by physical therapy/Occupational Therapy, need nursing home placement.  Currently pending for bed ? ?Assessment and Plan: ?Syncope secondary to vasovagal reaction. ?Orthostatic hypotension. ?Patient was able to walk with physical therapy, no longer feels dizzy. ?Currently pending nursing home placement. ? ?Elevated troponin secondary to syncope. ?Possible SVT. ?Negative coronary angiogram. ? ?End-stage renal disease ?AV fistula thrombosis ?Hyponatremia. ?Followed by nephrology. ?Patient had an AV fistula thrombectomy performed 5/4. ?He is on scheduled dialysis. ? ? ?Essential hypertension. ?No change in treatment plan. ? ?Poorly controlled type 2 diabetes with hyperglycemia. ?Glucose running high, increase Humulin 70/30 to 25 units twice a day. ? ?Diarrhea. ?Does not seem to be infectious, will start for dose Imodium. ?  ?Chest wall pain. ?Continue as needed pain medicine ? ? ?  ? ?Subjective:  ?Patient doing well today, currently no chest pain shortness of breath. ?He has been complaining of loose stools since admission, he had 4-5 loose stools a day, moderate amount.  No abdominal pain or nausea vomiting. ? ?Physical Exam: ?Vitals:  ? 08/31/21 0511  08/31/21 0743 08/31/21 1140 08/31/21 1144  ?BP: 113/81 121/84 108/62   ?Pulse: 70 71 68 69  ?Resp: 17 17 18    ?Temp: 97.9 ?F (36.6 ?C) 97.6 ?F (36.4 ?C) 97.9 ?F (36.6 ?C)   ?TempSrc:  Oral    ?SpO2: 96% 98%  100%  ?Weight:      ?Height:      ? ?General exam: Appears calm and comfortable  ?Respiratory system: Clear to auscultation. Respiratory effort normal. ?Cardiovascular system: S1 & S2 heard, RRR. No JVD, murmurs, rubs, gallops or clicks. No pedal edema. ?Gastrointestinal system: Abdomen is nondistended, soft and nontender. No organomegaly or masses felt. Normal bowel sounds heard. ?Central nervous system: Alert and oriented. No focal neurological deficits. ?Extremities: Symmetric 5 x 5 power. ?Skin: No rashes, lesions or ulcers ?Psychiatry: Judgement and insight appear normal. Mood & affect appropriate.  ? ?Data Reviewed: ? ?There are no new results to review at this time. ? ?Family Communication:  ? ?Disposition: ?Status is: Inpatient ?Remains inpatient appropriate because: Unsafe discharge ? Planned Discharge Destination: Skilled nursing facility ? ? ? ?Time spent: 28 minutes ? ?Author: ?Sharen Hones, MD ?08/31/2021 1:38 PM ? ?For on call review [email or web address].  ?

## 2021-08-31 NOTE — Progress Notes (Signed)
?[facility]  ?ROUNDING NOTE  ? ?Subjective:  ? ?[name] [name] is a 46 y.o. male with past medical history of diabetes, CAD, hypertension and ESRD on dialysis. Patient presents to ED via EMS after syncopal episodes. Patient has been admitted for Syncope [R55] ? ?Patient is known to our practice from previous admissions and receives outpatient dialysis treatments at [facility], supervised by [facility] physicians. ? ?Update ?Tolerated his dialysis well yesterday.  Currently in the room with his mother at bedside.  Denies any acute complaints.  States today's a good day.  He was able to walk short distances with physical therapy on 5/5.  Short-term rehab/skilled nursing home is recommended.  Awaiting placement. ? ?Objective:  ?Vital signs in last 24 hours:  ?Temp:  [97.6 ?F (36.4 ?C)-98.4 ?F (36.9 ?C)] 97.6 ?F (36.4 ?C) (05/06 1638) ?Pulse Rate:  [70-86] 71 (05/06 0743) ?Resp:  [11-18] 17 (05/06 0743) ?BP: (101-136)/(69-84) 121/84 (05/06 0743) ?SpO2:  [96 %-99 %] 98 % (05/06 0743) ?Weight:  [126.9 kg] 126.9 kg (05/05 1145) ? ?Weight change: -0.542 kg ?Filed Weights  ? 08/30/21 0018 08/30/21 0926 08/30/21 1145  ?Weight: 127.6 kg 127.1 kg 126.9 kg  ? ? ?Intake/Output: ?I/O last 3 completed shifts: ?In: 480 [P.O.:480] ?Out: 848 [Other:848] ?  ?Intake/Output this shift: ? No intake/output data recorded. ? ?Physical Exam: ?General: NAD, resting quietly  ?Head: Normocephalic, atraumatic. Moist oral mucosal membranes  ?Eyes: Anicteric  ?Lungs:  Clear to auscultation, normal effort,  ?Heart: Regular rate and rhythm  ?Abdomen:  Soft, nontender  ?Extremities: No peripheral edema.  ?Neurologic: Nonfocal, moving all four extremities  ?Skin: Generalized ecchymosis  ?Access: Left hero graft + bruit, Rt Permcath  ? ? ?Basic Metabolic Panel: ?Recent Labs  ?Lab 08/24/21 ?1705 08/25/21 ?0144 08/25/21 ?2218 08/27/21 ?1531 08/29/21 ?1352 08/30/21 ?0900  ?NA 136 134* 137 135 133* 130*  ?K 3.9 4.1 4.3 3.8 3.9 4.0  ?CL 97*  99 102 99 98 94*  ?CO2 22 22 19* 20* 21* 20*  ?GLUCOSE 234* 266* 192* 193* 226* 422*  ?BUN 35* 40* 50* 39* 32* 51*  ?CREATININE 13.57* 14.52* 16.15* 14.54* 11.88* 13.93*  ?CALCIUM 9.4 8.9 8.8* 8.6* 8.3* 9.0  ?MG 2.1  --  2.2  --   --   --   ?PHOS  --   --   --   --  4.6 7.2*  ? ? ? ?Liver Function Tests: ?Recent Labs  ?Lab 08/24/21 ?1705 08/25/21 ?2218 08/29/21 ?1352 08/30/21 ?0900  ?AST 15 15  --   --   ?ALT 9 9  --   --   ?ALKPHOS 77 74  --   --   ?BILITOT 0.5 0.4  --   --   ?PROT 8.6* 7.7  --   --   ?ALBUMIN 3.7 3.3* 3.3* 3.2*  ? ? ?No results for input(s): LIPASE, AMYLASE in the last 168 hours. ?No results for input(s): AMMONIA in the last 168 hours. ? ?CBC: ?Recent Labs  ?Lab 08/24/21 ?1705 08/25/21 ?0144 08/25/21 ?2218 08/29/21 ?1352 08/30/21 ?4536  ?WBC 12.1* 9.3 7.8 11.7* 13.8*  ?NEUTROABS 10.4*  --   --   --   --   ?HGB 13.8 12.4* 12.3* 12.9* 11.6*  ?HCT 46.3 40.5 40.4 42.2 37.4*  ?MCV 91.7 89.4 90.6 90.9 89.0  ?PLT 168 177 215 198 200  ? ? ? ?Cardiac Enzymes: ?No results for input(s): CKTOTAL, CKMB, CKMBINDEX, TROPONINI in the last 168 hours. ? ?BNP: ?Invalid input(s): POCBNP ? ?  CBG: ?Recent Labs  ?Lab 08/30/21 ?0851 08/30/21 ?1237 08/30/21 ?1601 08/30/21 ?2044 08/31/21 ?0744  ?GLUCAP 348* 325* 388* 336* 253*  ? ? ? ?Microbiology: ?Results for orders placed or performed during the hospital encounter of 08/24/21  ?Resp Panel by RT-PCR (Flu A&B, Covid) Nasopharyngeal Swab     Status: None  ? Collection Time: 08/24/21  6:22 PM  ? Specimen: Nasopharyngeal Swab; Nasopharyngeal(NP) swabs in vial transport medium  ?Result Value Ref Range Status  ? SARS Coronavirus 2 by RT PCR NEGATIVE NEGATIVE Final  ?  Comment: (NOTE) ?SARS-CoV-2 target nucleic acids are NOT DETECTED. ? ?The SARS-CoV-2 RNA is generally detectable in upper respiratory ?specimens during the acute phase of infection. The lowest ?concentration of SARS-CoV-2 viral copies this assay can detect is ?138 copies/mL. A negative result does not preclude  SARS-Cov-2 ?infection and should not be used as the sole basis for treatment or ?other patient management decisions. A negative result may occur with  ?improper specimen collection/handling, submission of specimen other ?than nasopharyngeal swab, presence of viral mutation(s) within the ?areas targeted by this assay, and inadequate number of viral ?copies(<138 copies/mL). A negative result must be combined with ?clinical observations, patient history, and epidemiological ?information. The expected result is Negative. ? ?Fact Sheet for Patients:  ?EntrepreneurPulse.com.au ? ?Fact Sheet for Healthcare Providers:  ?IncredibleEmployment.be ? ?This test is no t yet approved or cleared by the [place] FDA and  ?has been authorized for detection and/or diagnosis of SARS-CoV-2 by ?FDA under an Emergency Use Authorization (EUA). This EUA will remain  ?in effect (meaning this test can be used) for the duration of the ?COVID-19 declaration under Section 564(b)(1) of the Act, 21 ?U.S.C.section 360bbb-3(b)(1), unless the authorization is terminated  ?or revoked sooner.  ? ? ?  ? Influenza A by PCR NEGATIVE NEGATIVE Final  ? Influenza B by PCR NEGATIVE NEGATIVE Final  ?  Comment: (NOTE) ?The Xpert Xpress SARS-CoV-2/FLU/RSV plus assay is intended as an aid ?in the diagnosis of influenza from Nasopharyngeal swab specimens and ?should not be used as a sole basis for treatment. Nasal washings and ?aspirates are unacceptable for Xpert Xpress SARS-CoV-2/FLU/RSV ?testing. ? ?Fact Sheet for Patients: ?EntrepreneurPulse.com.au ? ?Fact Sheet for Healthcare Providers: ?IncredibleEmployment.be ? ?This test is not yet approved or cleared by the [place] FDA and ?has been authorized for detection and/or diagnosis of SARS-CoV-2 by ?FDA under an Emergency Use Authorization (EUA). This EUA will remain ?in effect (meaning this test can be used) for the duration of  the ?COVID-19 declaration under Section 564(b)(1) of the Act, 21 U.S.C. ?section 360bbb-3(b)(1), unless the authorization is terminated or ?revoked. ? ?Performed at [facility], [place], ?[place] [address] ?  ?MRSA Next Gen by PCR, Nasal     Status: None  ? Collection Time: 08/24/21 10:11 PM  ? Specimen: Nasal Mucosa; Nasal Swab  ?Result Value Ref Range Status  ? MRSA by PCR Next Gen NOT DETECTED NOT DETECTED Final  ?  Comment: (NOTE) ?The GeneXpert MRSA Assay (FDA approved for NASAL specimens only), ?is one component of a comprehensive MRSA colonization surveillance ?program. It is not intended to diagnose MRSA infection nor to guide ?or monitor treatment for MRSA infections. ?Test performance is not FDA approved in patients less than 2 years ?old. ?Performed at [facility], [place], ?[place] [address] ?  ? ? ?Coagulation Studies: ?No results for input(s): LABPROT, INR in the last 72 hours. ? ?Urinalysis: ?No results for input(s):  COLORURINE, LABSPEC, [place], GLUCOSEU, HGBUR, BILIRUBINUR, KETONESUR, PROTEINUR, UROBILINOGEN, NITRITE, LEUKOCYTESUR in the last 72 hours. ? ?Invalid input(s): APPERANCEUR  ? ? ?Imaging: ?No results found. ? ? ?Medications:  ? ? sodium chloride 250 mL/hr (08/28/21 0448)  ? sodium chloride    ? ? aspirin EC  81 mg Oral q1800  ? carvedilol  25 mg Oral BID  ? Chlorhexidine Gluconate Cloth  6 each Topical Daily  ? cinacalcet  60 mg Oral Q breakfast  ? heparin  5,000 Units Subcutaneous Q8H  ? hydrOXYzine  25 mg Oral QHS  ? insulin aspart  0-6 Units Subcutaneous TID WC  ? insulin aspart protamine- aspart  25 Units Subcutaneous BID WC  ? loperamide  2 mg Oral BID AC  ? losartan  50 mg Oral Daily  ? pantoprazole  40 mg Oral Daily  ? pneumococcal 20-valent conjugate vaccine  0.5 mL Intramuscular Tomorrow-1000  ? sevelamer carbonate  2.4 g Oral TID WC  ? sodium chloride flush  3 mL Intravenous Q12H  ? sodium chloride flush  3 mL Intravenous  Q12H  ? ?sodium chloride, acetaminophen **OR** acetaminophen, bisacodyl, HYDROcodone-acetaminophen, HYDROmorphone (DILAUDID) injection, loperamide, ondansetron (ZOFRAN) IV, polyethylene glycol, sodium chloride

## 2021-09-01 ENCOUNTER — Encounter (INDEPENDENT_AMBULATORY_CARE_PROVIDER_SITE_OTHER): Payer: Self-pay | Admitting: Nurse Practitioner

## 2021-09-01 DIAGNOSIS — N186 End stage renal disease: Secondary | ICD-10-CM | POA: Diagnosis not present

## 2021-09-01 DIAGNOSIS — I10 Essential (primary) hypertension: Secondary | ICD-10-CM | POA: Diagnosis not present

## 2021-09-01 DIAGNOSIS — R55 Syncope and collapse: Secondary | ICD-10-CM | POA: Diagnosis not present

## 2021-09-01 DIAGNOSIS — Z992 Dependence on renal dialysis: Secondary | ICD-10-CM | POA: Diagnosis not present

## 2021-09-01 LAB — PROTIME-INR
INR: 1.1 (ref 0.8–1.2)
Prothrombin Time: 13.7 seconds (ref 11.4–15.2)

## 2021-09-01 LAB — GLUCOSE, CAPILLARY
Glucose-Capillary: 150 mg/dL — ABNORMAL HIGH (ref 70–99)
Glucose-Capillary: 67 mg/dL — ABNORMAL LOW (ref 70–99)
Glucose-Capillary: 72 mg/dL (ref 70–99)
Glucose-Capillary: 137 mg/dL — ABNORMAL HIGH (ref 70–99)

## 2021-09-01 MED ORDER — WARFARIN SODIUM 2 MG PO TABS: 2.0000 mg | ORAL_TABLET | Freq: Every day | ORAL | 0 refills | Status: DC

## 2021-09-01 MED ORDER — INSULIN ASPART PROT & ASPART (70-30 MIX) 100 UNIT/ML ~~LOC~~ SUSP
35.0000 [IU] | Freq: Every day | SUBCUTANEOUS | Status: DC
  Filled 2021-09-01: qty 10

## 2021-09-01 NOTE — Plan of Care (Signed)

## 2021-09-01 NOTE — Progress Notes (Signed)
? ?Subjective:  ? ? Patient ID: [name] [name], male    DOB: January 25, 1976, 46 y.o.   MRN: [id number] ?No chief complaint on file. ? ? ?Patient presents today for follow-up evaluation of his upper extremity access with recent hero graft placement.  Most recent ultrasound showed adequate flow within the access.  Today the wound is currently healed enough to allow for access ? ? ?Review of Systems  ?All other systems reviewed and are negative. ? ?   ?Objective:  ? Physical Exam ?Vitals reviewed.  ?HENT:  ?   Head: Normocephalic.  ?Cardiovascular:  ?   Rate and Rhythm: Normal rate.  ?   Pulses: Normal pulses.  ?   Arteriovenous access: Left arteriovenous access is present. ?   Comments: Good thrill and bruit ?Pulmonary:  ?   Effort: Pulmonary effort is normal.  ?Neurological:  ?   Mental Status: He is alert.  ? ? ?BP 103/71 (BP Location: Right Arm)   Pulse 83   Resp 17   Ht 6\' 1"  (1.854 m)   Wt 285 lb (129.3 kg)   BMI 37.60 kg/m?  ? ?Past Medical History:  ?Diagnosis Date  ? 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 CHF (congestive heart failure) ([place])   ? Coronary artery disease   ? Diabetes mellitus without complication ([place])   ? Dialysis patient [facility])   ? M-W-F  ? GERD (gastroesophageal reflux disease)   ? Hypertension   ? Kyrle's disease   ? Renal disorder   ? on dialysis for 4 years three times a week last was Tuesday  ? Secondary hyperparathyroidism of renal origin [facility])   ? Shortness of breath dyspnea   ? Type 2 diabetes mellitus with diabetic nephropathy ([place])   ? Type 2 diabetes mellitus with diabetic retinopathy ([place])   ? ? ?Social History  ? ?Socioeconomic History  ? Marital status: Single  ?  Spouse name: Not on file  ? Number of children: Not on file  ? Years of education: Not on file  ? Highest education level: Not on file  ?Occupational History  ? Not on file  ?Tobacco Use  ? Smoking status: Never  ? Smokeless tobacco: Never  ?Vaping Use  ? Vaping Use: Never used  ?Substance and Sexual Activity  ?  Alcohol use: Not Currently  ?  Comment: occ. shot  ? Drug use: No  ? Sexual activity: Not on file  ?Other Topics Concern  ? Not on file  ?Social History Narrative  ? Lives by himself  ? ?Social Determinants of Health  ? ?Financial Resource Strain: Not on file  ?Food Insecurity: Not on file  ?Transportation Needs: Not on file  ?Physical Activity: Not on file  ?Stress: Not on file  ?Social Connections: Not on file  ?Intimate Partner Violence: Not on file  ? ? ?Past Surgical History:  ?Procedure Laterality Date  ? A/V FISTULAGRAM Left 10/01/2017  ? Procedure: A/V FISTULAGRAM;  Surgeon: Algernon Huxley, MD;  Location: [place] CV LAB;  Service: Cardiovascular;  Laterality: Left;  ? A/V FISTULAGRAM Left 05/17/2020  ? Procedure: A/V FISTULAGRAM;  Surgeon: Algernon Huxley, MD;  Location: [place] CV LAB;  Service: Cardiovascular;  Laterality: Left;  ? A/V SHUNTOGRAM Left 04/16/2021  ? Procedure: A/V SHUNTOGRAM;  Surgeon: Katha Cabal, MD;  Location: [place] CV LAB;  Service: Cardiovascular;  Laterality: Left;  ? A/V SHUNTOGRAM Left 08/29/2021  ? Procedure: A/V SHUNTOGRAM;  Surgeon: Algernon Huxley, MD;  Location: [place] CV LAB;  Service: Cardiovascular;  Laterality: Left;  ? CATARACT EXTRACTION W/PHACO Right 06/30/2018  ? Procedure: CATARACT EXTRACTION PHACO AND INTRAOCULAR LENS PLACEMENT (IOC)-RIGHT;  Surgeon: Eulogio Bear, MD;  Location: [facility] ORS;  Service: Ophthalmology;  Laterality: Right;  [place] 00:07.0 ?CDE 0.16 ?FLUID PACK lOT # [id number]  ? CATARACT EXTRACTION W/PHACO Left 12/24/2018  ? Procedure: CATARACT EXTRACTION PHACO AND INTRAOCULAR LENS PLACEMENT (IOC);  Surgeon: Eulogio Bear, MD;  Location: [facility] ORS;  Service: Ophthalmology;  Laterality: Left;  [place] 00:30.0 ?CDE 2.01 ?FLUID PACK LOT # [id number]  ? COLONOSCOPY WITH PROPOFOL N/A 04/23/2017  ? Procedure: COLONOSCOPY WITH PROPOFOL;  Surgeon: [name], MD;  Location: [facility] ENDOSCOPY;  Service: Endoscopy;  Laterality: N/A;  ? COLONOSCOPY  WITH PROPOFOL N/A 07/02/2017  ? Procedure: COLONOSCOPY WITH PROPOFOL;  Surgeon: [name], MD;  Location: [facility] ENDOSCOPY;  Service: Endoscopy;  Laterality: N/A;  ? DIALYSIS/PERMA CATHETER INSERTION N/A 09/27/2020  ? Procedure: DIALYSIS/PERMA CATHETER INSERTION;  Surgeon: Algernon Huxley, MD;  Location: [place] CV LAB;  Service: Cardiovascular;  Laterality: N/A;  ? DIALYSIS/PERMA CATHETER REMOVAL N/A 11/01/2020  ? Procedure: DIALYSIS/PERMA CATHETER REMOVAL;  Surgeon: Algernon Huxley, MD;  Location: [place] CV LAB;  Service: Cardiovascular;  Laterality: N/A;  ? ESOPHAGOGASTRODUODENOSCOPY (EGD) WITH PROPOFOL N/A 04/23/2017  ? Procedure: ESOPHAGOGASTRODUODENOSCOPY (EGD) WITH PROPOFOL;  Surgeon: [name], MD;  Location: [facility] ENDOSCOPY;  Service: Endoscopy;  Laterality: N/A;  ? ESOPHAGOGASTRODUODENOSCOPY (EGD) WITH PROPOFOL N/A 03/27/2020  ? Procedure: ESOPHAGOGASTRODUODENOSCOPY (EGD) WITH PROPOFOL;  Surgeon: Lesly Rubenstein, MD;  Location: [facility] ENDOSCOPY;  Service: Endoscopy;  Laterality: N/A;  ? EYE SURGERY    ? RETINA  ? INSERTION OF DIALYSIS CATHETER Right 08/09/2020  ? Procedure: INSERTION OF DIALYSIS CATHETER;  Surgeon: Algernon Huxley, MD;  Location: [facility] ORS;  Service: Vascular;  Laterality: Right;  ? LEFT HEART CATH AND CORONARY ANGIOGRAPHY N/A 08/27/2021  ? Procedure: LEFT HEART CATH AND CORONARY ANGIOGRAPHY;  Surgeon: Dionisio David, MD;  Location: [place] CV LAB;  Service: Cardiovascular;  Laterality: N/A;  ? PERIPHERAL VASCULAR CATHETERIZATION N/A 05/07/2015  ? Procedure: A/V Shuntogram/Fistulagram;  Surgeon: Katha Cabal, MD;  Location: [place] CV LAB;  Service: Cardiovascular;  Laterality: N/A;  ? PERIPHERAL VASCULAR CATHETERIZATION Left 05/07/2015  ? Procedure: A/V Shunt Intervention;  Surgeon: Katha Cabal, MD;  Location: [place] CV LAB;  Service: Cardiovascular;  Laterality: Left;  ? PERIPHERAL VASCULAR THROMBECTOMY Left 07/16/2020  ? Procedure: PERIPHERAL  VASCULAR THROMBECTOMY;  Surgeon: Algernon Huxley, MD;  Location: [place] CV LAB;  Service: Cardiovascular;  Laterality: Left;  ? PERIPHERAL VASCULAR THROMBECTOMY Left 08/27/2020  ? Procedure: PERIPHERAL VASCULAR THROMBECTOMY;  Surgeon: Algernon Huxley, MD;  Location: [place] CV LAB;  Service: Cardiovascular;  Laterality: Left;  ? REVISON OF ARTERIOVENOUS FISTULA Left 08/09/2020  ? Procedure: REVISON OF ARTERIOVENOUS FISTULA;  Surgeon: Algernon Huxley, MD;  Location: [facility] ORS;  Service: Vascular;  Laterality: Left;  ? TEE WITHOUT CARDIOVERSION N/A 07/05/2015  ? Procedure: TRANSESOPHAGEAL ECHOCARDIOGRAM (TEE);  Surgeon: Dionisio David, MD;  Location: [facility] ORS;  Service: Cardiovascular;  Laterality: N/A;  ? UPPER EXTREMITY ANGIOGRAPHY Left 06/24/2018  ? Procedure: UPPER EXTREMITY ANGIOGRAPHY;  Surgeon: Algernon Huxley, MD;  Location: [place] CV LAB;  Service: Cardiovascular;  Laterality: Left;  ? VASCULAR ACCESS DEVICE INSERTION Left 07/11/2021  ? Procedure: INSERTION OF HERO VASCULAR ACCESS DEVICE;  Surgeon: Algernon Huxley, MD;  Location: [facility]  ORS;  Service: Vascular;  Laterality: Left;  ? WOUND DEBRIDEMENT N/A 09/25/2020  ? Procedure: DEBRIDEMENT WOUND;  Surgeon: Katha Cabal, MD;  Location: [facility] ORS;  Service: Vascular;  Laterality: N/A;  ? ? ?Family History  ?Problem Relation Age of Onset  ? Diabetes Father   ? ? ?Allergies  ?Allergen Reactions  ? Cephalexin Nausea Only  ? Ivp Dye [Iodinated Contrast Media] Diarrhea  ? Lisinopril Other (See Comments)  ?  Pt cannot recall reaction, cough  ? Other   ? ? ? ?  Latest Ref Rng & Units 08/30/2021  ?  8:54 AM 08/29/2021  ?  1:52 PM 08/25/2021  ? 10:18 PM  ?CBC  ?WBC 4.0 - 10.5 K/uL 13.8   11.7   7.8    ?Hemoglobin 13.0 - 17.0 g/dL 11.6   12.9   12.3    ?Hematocrit 39.0 - 52.0 % 37.4   42.2   40.4    ?Platelets 150 - 400 K/uL 200   198   215    ? ? ? ? ?CMP  ?   ?Component Value Date/Time  ? NA 130 (L) 08/30/2021 0900  ? NA 138 02/24/2014 0359  ? K 4.0 08/30/2021 0900  ? K 3.5  02/24/2014 0359  ? CL 94 (L) 08/30/2021 0900  ? CL 97 (L) 02/24/2014 0359  ? CO2 20 (L) 08/30/2021 0900  ? CO2 31 02/24/2014 0359  ? GLUCOSE 422 (H) 08/30/2021 0900  ? GLUCOSE 231 (H) 02/24/2014 0359  ? BUN 51 (H) 0

## 2021-09-01 NOTE — Progress Notes (Signed)
Mobility Specialist - Progress Note ? ? ? 09/01/21 1200  ?Mobility  ?Activity Ambulated independently in hallway;Stood at bedside  ?Level of Assistance Standby assist, set-up cues, supervision of patient - no hands on  ?Assistive Device Front wheel walker  ?Distance Ambulated (ft) 320 ft  ?Activity Response Tolerated well  ?$Mobility charge 1 Mobility  ? ? ?Pre-mobility: 77 HR, 100%SpO2 ?During mobility: 79 HR, 99% SpO2 ? ?PT in recliner upon arrival using RA. Pt completes STS with supervision and ambulates ModI - voices no complaints, no LOB noted. Pt returns to recliner with needs in reach. ? ?[name] [name] ?Mobility Specialist ?09/01/21, 12:08 PM ? ? ? ?

## 2021-09-01 NOTE — Discharge Summary (Signed)
?Physician Discharge Summary ?  ?Patient: [name] [name] MRN: [id number] DOB: [birth year]  ?Admit date:     08/24/2021  ?Discharge date: 09/01/21  ?Discharge Physician: Sharen Hones  ? ?PCP: Danelle Berry, NP  ? ?Recommendations at discharge:  ? ?Follow-up with PCP in 1 week.  Please follow for INR, INR below 2 is okay for prevention of thrombosis in AV fistula ?Follow-up with vascular surgery in 2 weeks ? ?Discharge Diagnoses: ?Principal Problem: ?  Syncope ?Active Problems: ?  ESRD on hemodialysis ([place]) - M, W, F via LUE AV fistula ?  Hypertension ?  Elevated troponin ?  Poorly controlled type 2 diabetes mellitus ([place]) ?  Leukocytosis ? ?Resolved Problems: ?  * No resolved hospital problems. * ? ?Hospital Course: ?[name] is an 46 y.o. male with HTN, poorly controlled DM 2, ESRD on HD MWF, CAD was recently seen in the ED 2 days ago with complaints of malaise, decreased p.o. intake with nausea and dizziness.  He was noted to be orthostatic at the time and was given gentle hydration and discharged home.  He came back to the hospital after syncope episode.  Patient also has been feeling dizzy since arriving the hospital. ?Had another episode of syncope on 5/1.  Coronary angiogram was performed 5/2 without significant occlusion. ?AV fistula graft thrombectomy was performed on 5/4. ?Patient is evaluated by physical therapy/Occupational Therapy, need nursing home placement.  Currently pending for bed ?He is reassessed by physical therapy, patient progressing well, he is close to goal for PT.  No need for nursing home.  He will be discharged home with home care and PT OT ?Assessment and Plan: ? ?Syncope secondary to vasovagal reaction. ?Orthostatic hypotension. ?Patient was able to walk with physical therapy, no longer feels dizzy. ?Additionally improved, reassessed by PT no longer recommend nursing home placement.  Discussed with Education officer, museum, will set up home care. ? ?Elevated troponin secondary to  syncope. ?Possible SVT. ?Negative coronary angiogram. ? ?End-stage renal disease ?AV fistula thrombosis ?Hyponatremia. ?Followed by nephrology. ?Patient had an AV fistula thrombectomy performed 5/4. ?He is on scheduled dialysis. ?Patient is also put on 2 mg of warfarin on a daily basis by nephrology, the goal of INR does not need to be above 2.  Patient can be followed by PCP as outpatient. ? ? ?Essential hypertension. ?No change in treatment plan. ? ?Poorly controlled type 2 diabetes with hyperglycemia. ?Patient received a dose of 70/30 yesterday morning, he did not need additional dose yesterday evening.  He received 25 units this morning, glucose at 67 before lunch.  Advised to not take any insulin this evening, resume insulin tomorrow evening.  I will keep patient until evening time before discharge to make sure glucose is well before discharge. ?  ?Diarrhea. ?Does not appear to be infectious.  Continue as needed Imodium ?  ?Chest wall pain. ?Appear to be chest wall pain. ? ? ?  ? ? ?Consultants: Nephrology and vascular ?Procedures performed: HD, thrombectomy of AV fistula ?Disposition: Home health ?Diet recommendation:  ?Discharge Diet Orders (From admission, onward)  ? ?  Start     Ordered  ? 09/01/21 0000  Diet - low sodium heart healthy       ? 09/01/21 1239  ? ?  ?  ? ?  ? ?Cardiac diet ?DISCHARGE MEDICATION: ?Allergies as of 09/01/2021   ? ?   Reactions  ? Cephalexin Nausea Only  ? Ivp Dye [iodinated Contrast Media] Diarrhea  ? Lisinopril Other (  See Comments)  ? Pt cannot recall reaction, cough  ? Other   ? ?  ? ?  ?Medication List  ?  ? ?STOP taking these medications   ? ?HYDROcodone-acetaminophen 5-325 MG tablet ?Commonly known as: NORCO/VICODIN ?  ? ?  ? ?TAKE these medications   ? ?aspirin EC 81 MG tablet ?Take 81 mg by mouth daily at 6 PM. (1700) ?  ?carvedilol 25 MG tablet ?Commonly known as: COREG ?Take 25 mg by mouth 2 (two) times daily. ?  ?cinacalcet 60 MG tablet ?Commonly known as: SENSIPAR ?Take  1 tablet by mouth daily. ?  ?cyclobenzaprine 10 MG tablet ?Commonly known as: FLEXERIL ?Take 5 mg by mouth 2 (two) times daily as needed. ?  ?HumuLIN 70/30 (70-30) 100 UNIT/ML injection ?Generic drug: insulin NPH-regular Human ?Inject 10-30 Units into the skin See admin instructions. 10 units before breakfast and 30 units before supper ?ON DIALYSIS DAYS (M-W-F), NO AM INSULIN ?  ?hydrOXYzine 25 MG tablet ?Commonly known as: ATARAX ?Take 25 mg by mouth at bedtime. ?  ?Lidocaine HCl 4 % Liqd ?Apply topically. ?  ?loperamide 2 MG capsule ?Commonly known as: IMODIUM ?Take 2 mg by mouth as needed for diarrhea or loose stools (Take 1/2-1 tablet as meeded to prevent diarrhea. Can take up to 1 tablet every 4 hours. Do not exceed 4 tablets in a day.). ?  ?losartan 50 MG tablet ?Commonly known as: COZAAR ?Take 50 mg by mouth daily. (1700) ?  ?omeprazole 40 MG capsule ?Commonly known as: PRILOSEC ?Take 40 mg by mouth daily. ?  ?sevelamer carbonate 2.4 g Pack ?Commonly known as: RENVELA ?Take 2.4 g by mouth 3 (three) times daily with meals. ?  ?vitamin A 25000 UNIT capsule ?Take 25,000 Units by mouth daily. ?  ?vitamin E 1000 UNIT capsule ?Take 1,000 Units by mouth daily. Not sure of dose; started 4/29 ?  ?warfarin 2 MG tablet ?Commonly known as: Coumadin ?Take 1 tablet (2 mg total) by mouth daily. ?  ? ?  ? ?  ?  ? ? ?  ?Discharge Care Instructions  ?(From admission, onward)  ?  ? ? ?  ? ?  Start     Ordered  ? 09/01/21 0000  Discharge wound care:       ?Comments: Follow with vascular  ? 09/01/21 1239  ? ?  ?  ? ?  ? ? Follow-up Information   ? ? Evern Bio H, NP Follow up in 1 week(s).   ?Specialty: Nurse Practitioner ?Contact information: ?[address] ?[place] [address] ?[phone] ? ? ?  ?  ? ? Dew, Erskine Squibb, MD Follow up in 2 week(s).   ?Specialties: Vascular Surgery, Radiology, Interventional Cardiology ?Contact information: ?[address] ?[place] [address] ?[phone] ? ? ?  ?  ? ?  ?  ? ?   ? ?Discharge Exam: ?Filed Weights  ? 08/30/21 0018 08/30/21 0926 08/30/21 1145  ?Weight: 127.6 kg 127.1 kg 126.9 kg  ? ?General exam: Appears calm and comfortable  ?Respiratory system: Clear to auscultation. Respiratory effort normal. ?Cardiovascular system: S1 & S2 heard, RRR. No JVD, murmurs, rubs, gallops or clicks. No pedal edema. ?Gastrointestinal system: Abdomen is nondistended, soft and nontender. No organomegaly or masses felt. Normal bowel sounds heard. ?Central nervous system: Alert and oriented. No focal neurological deficits. ?Extremities: Symmetric 5 x 5 power. ?Skin: No rashes, lesions or ulcers ?Psychiatry: Judgement and insight appear normal. Mood & affect appropriate.  ? ? ?Condition at discharge:  good ? ?The results of significant diagnostics from this hospitalization (including imaging, microbiology, ancillary and laboratory) are listed below for reference.  ? ?Imaging Studies: ?DG Chest 2 View ? ?Result Date: 08/22/2021 ?CLINICAL DATA:  46 year old male with shortness of breath and dizziness for several days. EXAM: CHEST - 2 VIEW COMPARISON:  Chest radiographs 01/29/2021 and earlier. FINDINGS: PA and lateral views. Right chest dual lumen dialysis type catheter. Left IJ approach elongated vascular cannula or catheter. Left subclavian and axillary region vascular stents. Normal cardiac size and mediastinal contours. Visualized tracheal air column is within normal limits. Mildly lower lung volumes compared to last year. No pneumothorax, pulmonary edema, pleural effusion or confluent pulmonary opacity. No acute osseous abnormality identified. Negative visible bowel gas. IMPRESSION: No acute cardiopulmonary abnormality. Electronically Signed   By: Genevie Ann M.D.   On: 08/22/2021 11:19  ? ?CT HEAD WO CONTRAST (5MM) ? ?Result Date: 08/24/2021 ?CLINICAL DATA:  Head trauma, abnormal mental status, loss of consciousness; history CHF, coronary artery disease, type II diabetes mellitus, end-stage renal disease  on dialysis, hypertension EXAM: CT HEAD WITHOUT CONTRAST TECHNIQUE: Contiguous axial images were obtained from the base of the skull through the vertex without intravenous contrast. RADIATION DOSE REDUCTION: This exam w

## 2021-09-01 NOTE — Progress Notes (Signed)
Pt's blood sugar was 72 before meal, lunch was completed and pt reported dizziness, saying his blood sugar felt dropping quickly even after lunch was eaten. Blood sugar was recheck and resulted to 67. Pt was alert and oriented. Snacks were offered. Will recheck blood sugar after snacks. MD was notified after MD ordered DC, we will hold DC until blood sugar is stable, possible this afternoon. ?

## 2021-09-01 NOTE — Progress Notes (Signed)
Physical Therapy Treatment ?Patient Details ?Name: [name] [name] ?MRN: [id number] ?DOB: [birth year] ?Today's Date: 09/01/2021 ? ? ?History of Present Illness 46 y.o. male with HTN, poorly controlled DM 2, ESRD on HD MWF, CAD was recently seen in the ED 2 days ago with complaints of malaise, decreased p.o. intake with nausea and dizziness.  He was noted to be orthostatic at the time and was given gentle hydration and discharged home.  He came back to the hospital after syncope episode.  Patient also has been feeling dizzy since arriving the hospital. Had another episode of syncope on 5/1.  Coronary angiogram was performed 5/2 without significant occlusion. AV fistula graft thrombectomy was performed on 5/4. ? ?  ?PT Comments  ? ? Pt seen for PT tx with pt received ambulating out of bathroom with RW & mod I. Session focused on stair training & gait with LRAD. After demo & instruction pt is able to negotiate 1 step with RW & supervision & ambulate 1 laps around nurses station with RW & mod I. PT provided education throughout session as noted below. Discussed change in d/c recommendations to HHPT & pt agreeable, team notified. ? ?   ?Recommendations for follow up therapy are one component of a multi-disciplinary discharge planning process, led by the attending physician.  Recommendations may be updated based on patient status, additional functional criteria and insurance authorization. ? ?Follow Up Recommendations ? Home health PT ?  ?  ?Assistance Recommended at Discharge PRN  ?Patient can return home with the following A little help with bathing/dressing/bathroom;Assistance with cooking/housework;Help with stairs or ramp for entrance ?  ?Equipment Recommendations ? Rolling walker (2 wheels)  ?  ?Recommendations for Other Services   ? ? ?  ?Precautions / Restrictions Precautions ?Precautions: Fall ?Restrictions ?Weight Bearing Restrictions: No  ?  ? ?Mobility ? Bed Mobility ?  ?  ?  ?  ?  ?  ?  ?  ?   ? ?Transfers ?Overall transfer level: Independent ?Equipment used: Rolling walker (2 wheels) ?Transfers: Sit to/from Stand ?  ?  ?  ?  ?  ?  ?General transfer comment: STS from recliner with independence ?  ? ?Ambulation/Gait ?Ambulation/Gait assistance: Modified independent (Device/Increase time) ?Gait Distance (Feet): 320 Feet ?Assistive device: Rolling walker (2 wheels) ?  ?Gait velocity: WNL ?  ?  ?  ? ? ?Stairs ?Stairs: Yes ?Stairs assistance: Supervision ?Stair Management: No rails, With walker ?Number of Stairs: 1 ?General stair comments: PT provides demo & instruction with pt return demonstrating with supervision ? ? ?Wheelchair Mobility ?  ? ?Modified Rankin (Stroke Patients Only) ?  ? ? ?  ?Balance Overall balance assessment: Needs assistance ?Sitting-balance support: Feet supported, No upper extremity supported ?Sitting balance-Leahy Scale: Good ?  ?  ?Standing balance support: During functional activity, No upper extremity supported ?Standing balance-Leahy Scale: Good ?Standing balance comment: Pt able to adjust pants around hips without LOB without UE support ?  ?  ?  ?  ?  ?  ?  ?  ?  ?  ?  ?  ? ?  ?Cognition Arousal/Alertness: Awake/alert ?Behavior During Therapy: [facility] for tasks assessed/performed ?Overall Cognitive Status: Within Functional Limits for tasks assessed ?  ?  ?  ?  ?  ?  ?  ?  ?  ?  ?  ?  ?  ?  ?  ?  ?General Comments: appropriate questions re: d/c home ?  ?  ? ?  ?Exercises   ? ?  ?  General Comments General comments (skin integrity, edema, etc.): Educated pt on mother assisting with taking close to [facility] for pt, using walker bag to transport items, possible need to move throw rugs if they present as tripping hazards, recommendation to use RW for mobility until cleared by HHPT to ambulate without AD. ?  ?  ? ?Pertinent Vitals/Pain Pain Assessment ?Pain Assessment: No/denies pain  ? ? ?Home Living   ?  ?  ?  ?  ?  ?  ?  ?  ?  ?   ?  ?Prior Function    ?  ?  ?   ? ?PT Goals (current  goals can now be found in the care plan section) Acute Rehab PT Goals ?Patient Stated Goal: to get well ?PT Goal Formulation: With patient ?Time For Goal Achievement: 09/12/21 ?Potential to Achieve Goals: Good ?Progress towards PT goals: Progressing toward goals ? ?  ?Frequency ? ? ? Min 2X/week ? ? ? ?  ?PT Plan Discharge plan needs to be updated  ? ? ?Co-evaluation   ?  ?  ?  ?  ? ?  ?AM-PAC PT "6 Clicks" Mobility   ?Outcome Measure ? Help needed turning from your back to your side while in a flat bed without using bedrails?: None ?Help needed moving from lying on your back to sitting on the side of a flat bed without using bedrails?: None ?Help needed moving to and from a bed to a chair (including a wheelchair)?: None ?Help needed standing up from a chair using your arms (e.g., wheelchair or bedside chair)?: None ?Help needed to walk in hospital room?: None ?Help needed climbing 3-5 steps with a railing? : A Little ?6 Click Score: 23 ? ?  ?End of Session   ?Activity Tolerance: Patient tolerated treatment well ?Patient left: in chair;with call bell/phone within reach ?Nurse Communication: Mobility status ?PT Visit Diagnosis: Unsteadiness on feet (R26.81);Muscle weakness (generalized) (M62.81) ?  ? ? ?Time: 6606-3016 ?PT Time Calculation (min) (ACUTE ONLY): 23 min ? ?Charges:  $Gait Training: 8-22 mins ?$Therapeutic Activity: 8-22 mins          ?          ? ?Lavone Nian, PT, DPT ?09/01/21, 1:52 PM ? ? ? ?Waunita Schooner ?09/01/2021, 1:51 PM ? ?

## 2021-09-01 NOTE — Progress Notes (Addendum)
Pt's blood sugar is better now, pt felt "ok but tired". Pt has DC order. AVS was given and explained to pt. All questions has been answered. Mother at bedside now to drive the pt home. ? ?

## 2021-09-01 NOTE — Progress Notes (Signed)
?[facility]  ?ROUNDING NOTE  ? ?Subjective:  ? ?[name] [name] is a 46 y.o. male with past medical history of diabetes, CAD, hypertension and ESRD on dialysis. Patient presents to ED via EMS after syncopal episodes. Patient has been admitted for Syncope [R55] ? ?Patient is known to our practice from previous admissions and receives outpatient dialysis treatments at [facility], supervised by [facility] physicians. ? ?Update ?Doing well this morning ?Denies any c/o ?No SOB ? ? ?Objective:  ?Vital signs in last 24 hours:  ?Temp:  [97.6 ?F (36.4 ?C)-99.2 ?F (37.3 ?C)] 99.2 ?F (37.3 ?C) (05/07 0623) ?Pulse Rate:  [68-77] 77 (05/07 0821) ?Resp:  [16-18] 18 (05/07 7628) ?BP: (106-110)/(62-77) 110/76 (05/07 3151) ?SpO2:  [98 %-100 %] 100 % (05/07 7616) ? ?Weight change:  ?Filed Weights  ? 08/30/21 0018 08/30/21 0926 08/30/21 1145  ?Weight: 127.6 kg 127.1 kg 126.9 kg  ? ? ?Intake/Output: ?I/O last 3 completed shifts: ?In: 480 [P.O.:480] ?Out: -  ?  ?Intake/Output this shift: ? No intake/output data recorded. ? ?Physical Exam: ?General: NAD, resting quietly  ?Head: Normocephalic, atraumatic. Moist oral mucosal membranes  ?Eyes: Anicteric  ?Lungs:  Clear to auscultation, normal effort,  ?Heart: Regular rate and rhythm  ?Abdomen:  Soft, nontender  ?Extremities: No peripheral edema.  ?Neurologic: Nonfocal, moving all four extremities  ?Skin: Generalized ecchymosis  ?Access: Left hero graft , Rt Permcath  ? ? ?Basic Metabolic Panel: ?Recent Labs  ?Lab 08/25/21 ?2218 08/27/21 ?1531 08/29/21 ?1352 08/30/21 ?0900  ?NA 137 135 133* 130*  ?K 4.3 3.8 3.9 4.0  ?CL 102 99 98 94*  ?CO2 19* 20* 21* 20*  ?GLUCOSE 192* 193* 226* 422*  ?BUN 50* 39* 32* 51*  ?CREATININE 16.15* 14.54* 11.88* 13.93*  ?CALCIUM 8.8* 8.6* 8.3* 9.0  ?MG 2.2  --   --   --   ?PHOS  --   --  4.6 7.2*  ? ? ? ?Liver Function Tests: ?Recent Labs  ?Lab 08/25/21 ?2218 08/29/21 ?1352 08/30/21 ?0900  ?AST 15  --   --   ?ALT 9  --   --   ?ALKPHOS 74  --   --    ?BILITOT 0.4  --   --   ?PROT 7.7  --   --   ?ALBUMIN 3.3* 3.3* 3.2*  ? ? ?No results for input(s): LIPASE, AMYLASE in the last 168 hours. ?No results for input(s): AMMONIA in the last 168 hours. ? ?CBC: ?Recent Labs  ?Lab 08/25/21 ?2218 08/29/21 ?1352 08/30/21 ?0737  ?WBC 7.8 11.7* 13.8*  ?HGB 12.3* 12.9* 11.6*  ?HCT 40.4 42.2 37.4*  ?MCV 90.6 90.9 89.0  ?PLT 215 198 200  ? ? ? ?Cardiac Enzymes: ?No results for input(s): CKTOTAL, CKMB, CKMBINDEX, TROPONINI in the last 168 hours. ? ?BNP: ?Invalid input(s): POCBNP ? ?CBG: ?Recent Labs  ?Lab 08/31/21 ?1062 08/31/21 ?1213 08/31/21 ?1653 08/31/21 ?2201 09/01/21 ?6948  ?GLUCAP 253* 202* 97 182* 150*  ? ? ? ?Microbiology: ?Results for orders placed or performed during the hospital encounter of 08/24/21  ?Resp Panel by RT-PCR (Flu A&B, Covid) Nasopharyngeal Swab     Status: None  ? Collection Time: 08/24/21  6:22 PM  ? Specimen: Nasopharyngeal Swab; Nasopharyngeal(NP) swabs in vial transport medium  ?Result Value Ref Range Status  ? SARS Coronavirus 2 by RT PCR NEGATIVE NEGATIVE Final  ?  Comment: (NOTE) ?SARS-CoV-2 target nucleic acids are NOT DETECTED. ? ?The SARS-CoV-2 RNA is generally detectable in upper respiratory ?specimens during the  acute phase of infection. The lowest ?concentration of SARS-CoV-2 viral copies this assay can detect is ?138 copies/mL. A negative result does not preclude SARS-Cov-2 ?infection and should not be used as the sole basis for treatment or ?other patient management decisions. A negative result may occur with  ?improper specimen collection/handling, submission of specimen other ?than nasopharyngeal swab, presence of viral mutation(s) within the ?areas targeted by this assay, and inadequate number of viral ?copies(<138 copies/mL). A negative result must be combined with ?clinical observations, patient history, and epidemiological ?information. The expected result is Negative. ? ?Fact Sheet for Patients:   ?EntrepreneurPulse.com.au ? ?Fact Sheet for Healthcare Providers:  ?IncredibleEmployment.be ? ?This test is no t yet approved or cleared by the [place] FDA and  ?has been authorized for detection and/or diagnosis of SARS-CoV-2 by ?FDA under an Emergency Use Authorization (EUA). This EUA will remain  ?in effect (meaning this test can be used) for the duration of the ?COVID-19 declaration under Section 564(b)(1) of the Act, 21 ?U.S.C.section 360bbb-3(b)(1), unless the authorization is terminated  ?or revoked sooner.  ? ? ?  ? Influenza A by PCR NEGATIVE NEGATIVE Final  ? Influenza B by PCR NEGATIVE NEGATIVE Final  ?  Comment: (NOTE) ?The Xpert Xpress SARS-CoV-2/FLU/RSV plus assay is intended as an aid ?in the diagnosis of influenza from Nasopharyngeal swab specimens and ?should not be used as a sole basis for treatment. Nasal washings and ?aspirates are unacceptable for Xpert Xpress SARS-CoV-2/FLU/RSV ?testing. ? ?Fact Sheet for Patients: ?EntrepreneurPulse.com.au ? ?Fact Sheet for Healthcare Providers: ?IncredibleEmployment.be ? ?This test is not yet approved or cleared by the [place] FDA and ?has been authorized for detection and/or diagnosis of SARS-CoV-2 by ?FDA under an Emergency Use Authorization (EUA). This EUA will remain ?in effect (meaning this test can be used) for the duration of the ?COVID-19 declaration under Section 564(b)(1) of the Act, 21 U.S.C. ?section 360bbb-3(b)(1), unless the authorization is terminated or ?revoked. ? ?Performed at [facility], [place], ?[place] [address] ?  ?MRSA Next Gen by PCR, Nasal     Status: None  ? Collection Time: 08/24/21 10:11 PM  ? Specimen: Nasal Mucosa; Nasal Swab  ?Result Value Ref Range Status  ? MRSA by PCR Next Gen NOT DETECTED NOT DETECTED Final  ?  Comment: (NOTE) ?The GeneXpert MRSA Assay (FDA approved for NASAL specimens only), ?is one component of a  comprehensive MRSA colonization surveillance ?program. It is not intended to diagnose MRSA infection nor to guide ?or monitor treatment for MRSA infections. ?Test performance is not FDA approved in patients less than 2 years ?old. ?Performed at [facility], [place], ?[place] [address] ?  ? ? ?Coagulation Studies: ?No results for input(s): LABPROT, INR in the last 72 hours. ? ?Urinalysis: ?No results for input(s): COLORURINE, LABSPEC, [place], GLUCOSEU, HGBUR, BILIRUBINUR, KETONESUR, PROTEINUR, UROBILINOGEN, NITRITE, LEUKOCYTESUR in the last 72 hours. ? ?Invalid input(s): APPERANCEUR  ? ? ?Imaging: ?No results found. ? ? ?Medications:  ? ? sodium chloride 250 mL/hr (08/28/21 0448)  ? sodium chloride    ? ? aspirin EC  81 mg Oral q1800  ? carvedilol  25 mg Oral BID  ? Chlorhexidine Gluconate Cloth  6 each Topical Daily  ? cinacalcet  60 mg Oral Q breakfast  ? hydrOXYzine  25 mg Oral QHS  ? insulin aspart  0-6 Units Subcutaneous TID WC  ? insulin aspart protamine- aspart  25 Units Subcutaneous BID WC  ? losartan  50 mg Oral Daily  ? pantoprazole  40 mg Oral Daily  ? pneumococcal 20-valent conjugate vaccine  0.5 mL Intramuscular Tomorrow-1000  ? sevelamer carbonate  2.4 g Oral TID WC  ? sodium chloride flush  3 mL Intravenous Q12H  ? sodium chloride flush  3 mL Intravenous Q12H  ? Warfarin - Physician Dosing Inpatient   Does not apply q1600  ? ?sodium chloride, acetaminophen **OR** acetaminophen, bisacodyl, HYDROcodone-acetaminophen, HYDROmorphone (DILAUDID) injection, loperamide, ondansetron (ZOFRAN) IV, polyethylene glycol, sodium chloride flush ? ?Assessment/ Plan:  ?[name] is a 46 y.o.  male with past medical history of diabetes, CAD, hypertension and ESRD on dialysis. Patient presents to ED via EMS after syncopal episodes. Patient has been admitted for Syncope [R55] ? ?[facility]/MWF/left aVF/Rt Permcath ? ?End-stage renal disease on hemodialysis.  With  complication of dialysis access.  Hero graft was declotted on 5/4.  Discussed with patient and his mother about using low-dose Coumadin.  We will start 2 mg daily with goal INR < 2.  This is to primarily prevent dialysis graft from clotting.

## 2021-09-01 NOTE — TOC Progression Note (Signed)
Transition of Care (TOC) - Progression Note  ? ? ?Patient Details  ?Name: Iban Utz Ingham ?MRN: [id number] ?Date of Birth: 08/24/1975 ? ?Transition of Care (TOC) CM/SW Contact  ?Harriet Masson, RN ?Phone Number: ?09/01/2021, 12:31 PM ? ?Clinical Narrative:    ?Recommendations for HHPT spoke with pt who is open to any agency. Pt set up with [facility] Corene Cornea) for services. Pt continues to drive to his HD on M-W-F.  No other needs presented at this time. ? ?[facility]-lvm (representative contact on call [place]) ?[facility] (Cory)-lvm ?[facility] (Meg)-lvm (representative [place] will contact on call) ?[facility] (Jason)-Accepted services-can visit on Tuesday if discharged today. ? ?Team made aware of the arrangements. Will continue to monitor pt for any other needs prior to discharge. ? ? ?Expected Discharge Plan: [place] ?Barriers to Discharge: Continued Medical Work up ? ?Expected Discharge Plan and Services ?Expected Discharge Plan: [place] ?  ?  ?  ?Living arrangements for the past 2 months: [place] ?                ?  ?  ?  ?  ?  ?  ?[place] Agency: [place] ([place]) ?Date HH Agency Contacted: 09/01/21 ?[place]: 9381 ?Representative spoke with at [place]: Corene Cornea ? ? ?Social Determinants of Health (SDOH) Interventions ?  ? ?Readmission Risk Interventions ?   ? View : No data to display.  ?  ?  ?  ? ? ?

## 2021-09-10 LAB — BENZODIAZEPINES,MS,WB/SP RFX
7-Aminoclonazepam: NEGATIVE ng/mL
Alprazolam: NEGATIVE ng/mL
Benzodiazepines Confirm: NEGATIVE
Chlordiazepoxide: NEGATIVE
Clonazepam: NEGATIVE ng/mL
Desalkylflurazepam: NEGATIVE ng/mL
Desmethylchlordiazepoxide: NEGATIVE
Desmethyldiazepam: NEGATIVE ng/mL
Diazepam: NEGATIVE ng/mL
Flurazepam: NEGATIVE ng/mL
Lorazepam: NEGATIVE ng/mL
Midazolam: NEGATIVE ng/mL
Oxazepam: NEGATIVE ng/mL
Temazepam: NEGATIVE ng/mL
Triazolam: NEGATIVE ng/mL

## 2021-09-12 ENCOUNTER — Telehealth (INDEPENDENT_AMBULATORY_CARE_PROVIDER_SITE_OTHER): Payer: Self-pay

## 2021-09-12 NOTE — Telephone Encounter (Signed)
Spoke with the patient and he is scheduled with Dr. Lucky Cowboy for a permcath removal on 09/19/21 with a 11:00 am arrival time to the MM. Pre-procedure instructions were discussed and will be mailed.

## 2021-09-13 LAB — DRUG SCREEN 10 W/CONF, SERUM
Amphetamines, IA: NEGATIVE ng/mL
Barbiturates, IA: NEGATIVE ug/mL
Benzodiazepines, IA: NEGATIVE ng/mL
Cocaine & Metabolite, IA: NEGATIVE ng/mL
Methadone, IA: NEGATIVE ng/mL
Opiates, IA: NEGATIVE ng/mL
Oxycodones, IA: NEGATIVE ng/mL
Phencyclidine, IA: NEGATIVE ng/mL
Propoxyphene, IA: NEGATIVE ng/mL
THC(Marijuana) Metabolite, IA: NEGATIVE ng/mL

## 2021-09-13 LAB — OPIATES,MS,WB/SP RFX
6-Acetylmorphine: NEGATIVE
Codeine: NEGATIVE ng/mL
Dihydrocodeine: NEGATIVE ng/mL
Hydrocodone: NEGATIVE ng/mL
Hydromorphone: NEGATIVE ng/mL
Morphine: NEGATIVE ng/mL
Opiate Confirmation: NEGATIVE

## 2021-09-13 LAB — OXYCODONES,MS,WB/SP RFX
Oxycocone: NEGATIVE ng/mL
Oxycodones Confirmation: NEGATIVE
Oxymorphone: NEGATIVE ng/mL

## 2021-09-17 ENCOUNTER — Telehealth (INDEPENDENT_AMBULATORY_CARE_PROVIDER_SITE_OTHER): Payer: Self-pay

## 2021-09-17 NOTE — Telephone Encounter (Signed)
Patient called stating he had an appt in our office for a ultrasound and be seen as well as being scheduled for a permcath removal and all on 09/19/21. Patient's was informed to keep his appt for the ultrasound and be seen in our office and his permcath removal has been rescheduled to 09/26/21 and pre-procedure instructions will be mailed.

## 2021-09-18 ENCOUNTER — Other Ambulatory Visit (INDEPENDENT_AMBULATORY_CARE_PROVIDER_SITE_OTHER): Payer: Self-pay | Admitting: Nurse Practitioner

## 2021-09-18 DIAGNOSIS — N186 End stage renal disease: Secondary | ICD-10-CM

## 2021-09-19 ENCOUNTER — Encounter: Payer: Self-pay | Admitting: Vascular Surgery

## 2021-09-19 ENCOUNTER — Ambulatory Visit (INDEPENDENT_AMBULATORY_CARE_PROVIDER_SITE_OTHER): Payer: Medicare Other

## 2021-09-19 ENCOUNTER — Telehealth (INDEPENDENT_AMBULATORY_CARE_PROVIDER_SITE_OTHER): Payer: Self-pay

## 2021-09-19 ENCOUNTER — Encounter
Admission: RE | Disposition: A | Discharge status: Home / Self Care | Payer: Self-pay | Source: Home / Self Care | Attending: Vascular Surgery

## 2021-09-19 ENCOUNTER — Ambulatory Visit
Admission: RE | Admit: 2021-09-19 | Discharge: 2021-09-19 | Disposition: A | Discharge status: Home / Self Care | Payer: Medicare Other | Attending: Vascular Surgery | Admitting: Vascular Surgery

## 2021-09-19 ENCOUNTER — Encounter (INDEPENDENT_AMBULATORY_CARE_PROVIDER_SITE_OTHER): Payer: Self-pay | Admitting: Nurse Practitioner

## 2021-09-19 ENCOUNTER — Ambulatory Visit (INDEPENDENT_AMBULATORY_CARE_PROVIDER_SITE_OTHER): Payer: Medicare Other | Admitting: Nurse Practitioner

## 2021-09-19 VITALS — BP 127/77 | HR 87 | Resp 18 | Ht 73.0 in

## 2021-09-19 DIAGNOSIS — I132 Hypertensive heart and chronic kidney disease with heart failure and with stage 5 chronic kidney disease, or end stage renal disease: Secondary | ICD-10-CM | POA: Insufficient documentation

## 2021-09-19 DIAGNOSIS — E1122 Type 2 diabetes mellitus with diabetic chronic kidney disease: Secondary | ICD-10-CM | POA: Diagnosis not present

## 2021-09-19 DIAGNOSIS — I509 Heart failure, unspecified: Secondary | ICD-10-CM | POA: Diagnosis not present

## 2021-09-19 DIAGNOSIS — Z794 Long term (current) use of insulin: Secondary | ICD-10-CM | POA: Diagnosis not present

## 2021-09-19 DIAGNOSIS — N186 End stage renal disease: Secondary | ICD-10-CM

## 2021-09-19 DIAGNOSIS — I10 Essential (primary) hypertension: Secondary | ICD-10-CM

## 2021-09-19 DIAGNOSIS — Z992 Dependence on renal dialysis: Secondary | ICD-10-CM | POA: Diagnosis not present

## 2021-09-19 DIAGNOSIS — Y841 Kidney dialysis as the cause of abnormal reaction of the patient, or of later complication, without mention of misadventure at the time of the procedure: Secondary | ICD-10-CM | POA: Insufficient documentation

## 2021-09-19 DIAGNOSIS — T8249XA Other complication of vascular dialysis catheter, initial encounter: Secondary | ICD-10-CM | POA: Diagnosis present

## 2021-09-19 HISTORY — PX: DIALYSIS/PERMA CATHETER INSERTION: CATH118288

## 2021-09-19 LAB — GLUCOSE, CAPILLARY
Glucose-Capillary: 174 mg/dL — ABNORMAL HIGH (ref 70–99)
Glucose-Capillary: 147 mg/dL — ABNORMAL HIGH (ref 70–99)

## 2021-09-19 LAB — POTASSIUM (ARMC VASCULAR LAB ONLY): Potassium (ARMC vascular lab): 3.7 mmol/L (ref 3.5–5.1)

## 2021-09-19 SURGERY — DIALYSIS/PERMA CATHETER INSERTION
Anesthesia: Moderate Sedation

## 2021-09-19 MED ORDER — FENTANYL CITRATE PF 50 MCG/ML IJ SOSY
PREFILLED_SYRINGE | INTRAMUSCULAR | Status: AC
  Filled 2021-09-19: qty 1

## 2021-09-19 MED ORDER — VANCOMYCIN HCL 1500 MG/300ML IV SOLN
1500.0000 mg | Freq: Once | INTRAVENOUS | Status: AC
  Administered 2021-09-19: 1500 mg via INTRAVENOUS
  Filled 2021-09-19: qty 300

## 2021-09-19 MED ORDER — SODIUM CHLORIDE 0.9 % IV SOLN: INTRAVENOUS | Status: DC

## 2021-09-19 MED ORDER — DIPHENHYDRAMINE HCL 50 MG/ML IJ SOLN: 50.0000 mg | Freq: Once | INTRAMUSCULAR | Status: AC | PRN

## 2021-09-19 MED ORDER — SODIUM CHLORIDE FLUSH 0.9 % IV SOLN
INTRAVENOUS | Status: AC
  Filled 2021-09-19: qty 10

## 2021-09-19 MED ORDER — MIDAZOLAM HCL 2 MG/2ML IJ SOLN
INTRAMUSCULAR | Status: DC | PRN
Start: 2021-09-19 — End: 2021-09-20
  Administered 2021-09-19: 1 mg via INTRAVENOUS
  Administered 2021-09-19: 2 mg via INTRAVENOUS

## 2021-09-19 MED ORDER — METHYLPREDNISOLONE SODIUM SUCC 125 MG IJ SOLR
INTRAMUSCULAR | Status: AC
  Filled 2021-09-19: qty 2

## 2021-09-19 MED ORDER — FAMOTIDINE 20 MG PO TABS
40.0000 mg | ORAL_TABLET | Freq: Once | ORAL | Status: DC | PRN
Start: 2021-09-19 — End: 2021-09-20

## 2021-09-19 MED ORDER — MIDAZOLAM HCL 2 MG/2ML IJ SOLN
INTRAMUSCULAR | Status: DC
Start: 2021-09-19 — End: 2021-09-20
  Filled 2021-09-19: qty 2

## 2021-09-19 MED ORDER — CEFAZOLIN SODIUM-DEXTROSE 1-4 GM/50ML-% IV SOLN: 1.0000 g | INTRAVENOUS | Status: DC

## 2021-09-19 MED ORDER — HYDROMORPHONE HCL 1 MG/ML IJ SOLN: 1.0000 mg | Freq: Once | INTRAMUSCULAR | Status: DC | PRN

## 2021-09-19 MED ORDER — METHYLPREDNISOLONE SODIUM SUCC 125 MG IJ SOLR
125.0000 mg | Freq: Once | INTRAMUSCULAR | Status: DC | PRN
Start: 2021-09-19 — End: 2021-09-20

## 2021-09-19 MED ORDER — DIPHENHYDRAMINE HCL 50 MG/ML IJ SOLN
INTRAMUSCULAR | Status: AC
  Administered 2021-09-19: 50 mg via INTRAVENOUS
  Filled 2021-09-19: qty 1

## 2021-09-19 MED ORDER — ONDANSETRON HCL 4 MG/2ML IJ SOLN: 4.0000 mg | Freq: Four times a day (QID) | INTRAMUSCULAR | Status: DC | PRN

## 2021-09-19 MED ORDER — MIDAZOLAM HCL 2 MG/ML PO SYRP: 8.0000 mg | ORAL_SOLUTION | Freq: Once | ORAL | Status: DC | PRN

## 2021-09-19 MED ORDER — FENTANYL CITRATE (PF) 100 MCG/2ML IJ SOLN
INTRAMUSCULAR | Status: DC | PRN
  Administered 2021-09-19: 50 ug via INTRAVENOUS
  Administered 2021-09-19: 25 ug via INTRAVENOUS

## 2021-09-19 MED ORDER — MIDAZOLAM HCL 2 MG/2ML IJ SOLN
INTRAMUSCULAR | Status: AC
  Filled 2021-09-19: qty 2

## 2021-09-19 SURGICAL SUPPLY — 6 items
CATH PALINDROME-SP 14.5FX23 (CATHETERS) ×1 IMPLANT
GUIDEWIRE SUPER STIFF .035X180 (WIRE) ×1 IMPLANT
PACK ANGIOGRAPHY (CUSTOM PROCEDURE TRAY) ×1 IMPLANT
SUT MNCRL AB 4-0 PS2 18 (SUTURE) ×1 IMPLANT
SUT PROLENE 0 CT 1 30 (SUTURE) ×1 IMPLANT
TOWEL OR 17X26 4PK STRL BLUE (TOWEL DISPOSABLE) ×1 IMPLANT

## 2021-09-19 NOTE — OR Nursing (Signed)
As per convo with Dr Lucky Cowboy:: no premeds for dye allergy as no dye will be given; pt allergy to cef family abx changed to vanc and pharmacy called to adjust order dose for weight

## 2021-09-19 NOTE — Op Note (Signed)
OPERATIVE NOTE    PRE-OPERATIVE DIAGNOSIS: 1. ESRD 2. Non-functional permcath  POST-OPERATIVE DIAGNOSIS: same as above  PROCEDURE: Fluoroscopic guidance for placement of catheter Placement of a 23 cm tip to cuff tunneled hemodialysis catheter via the right internal jugular vein and removal of previous catheter  SURGEON: [name], MD  ANESTHESIA:  Local with moderate conscious sedation for 18 minutes using 3 mg of Versed and 75 mcg of Fentanyl  ESTIMATED BLOOD LOSS: 8 cc  FINDING(S): none  SPECIMEN(S):  None  INDICATIONS:   Patient is a 46 y.o.male who presents with non-functional dialysis catheter and ESRD.  The patient needs long term dialysis access for their ESRD, and a Permcath is necessary.  Risks and benefits are discussed and informed consent is obtained.    DESCRIPTION: After obtaining full informed written consent, the patient was brought back to the vascular suite. The patient received moderate conscious sedation during a face-to-face encounter with me present throughout the entire procedure and supervising the RN monitoring the vital signs, pulse oximetry, telemetry, and mental status throughout the entire procedure. The patient's existing catheter, right neck and chest were sterilely prepped and draped in a sterile surgical field was created.  The existing catheter was dissected free from the fibrous sheath securing the cuff with hemostats and blunt dissection.  A wire was placed. The existing catheter was then removed and the wire used to keep venous access. I selected a 23 cm tip to cuff tunneled dialysis catheter.  Using fluoroscopic guidance the catheter tips were parked in the right atrium. The appropriate distal connectors were placed. It withdrew blood well and flushed easily with heparinized saline and a concentrated heparin solution was then placed. It was secured to the chest wall with 2 Prolene sutures. A 4-0 Monocryl pursestring suture was placed around the exit  site. Sterile dressings were placed. The patient tolerated the procedure well and was taken to the recovery room in stable condition.  COMPLICATIONS: None  CONDITION: Stable  [name] 09/19/2021 2:41 PM   This note was created with Dragon Medical transcription system. Any errors in dictation are purely unintentional.

## 2021-09-19 NOTE — Telephone Encounter (Signed)
Patient is seen in office today and will be scheduled to have a permcath exchange today at the MM with Dr. Lucky Cowboy. Pre-procedure instructions will be handed to the patient.

## 2021-09-20 ENCOUNTER — Encounter: Payer: Self-pay | Admitting: Vascular Surgery

## 2021-09-20 ENCOUNTER — Telehealth (INDEPENDENT_AMBULATORY_CARE_PROVIDER_SITE_OTHER): Payer: Self-pay

## 2021-09-20 NOTE — Telephone Encounter (Signed)
I attempted to contact the patient to schedule him for a LUE declot. A message was left for a return call.

## 2021-09-24 NOTE — Telephone Encounter (Signed)
Spoke with the patient and he has been rescheduled for a LUE declot from a permcath removal on 09/26/21 with Dr. Lucky Cowboy. Patient is to arrive at 12:30 pm to the MM. Pre-procedure instructions were discussed  and patient stated he understood.

## 2021-09-25 NOTE — H&P (View-Only) (Signed)
Subjective:    Patient ID: [name] [name], male    DOB: [birth year], 46 y.o.   MRN: [id number] No chief complaint on file.   [name] [name] is a 46 year old male who returns today for follow-up studies after recent thrombectomy that was required after the patient had a severe fall on his left upper extremity.  The patient notes that he has been having issues with dialysis since that time.  He notes that he has not been able to utilize his dialysis access.  He also notes that he is having problems with his current PermCath.  Today noninvasive studies show flow volume of 78 that shows he is totally occluded with an apparent pseudoaneurysms in proximal to the anastomosis site.  It is noted could also be the old AV fistula region.   Review of Systems  Musculoskeletal:  Positive for gait problem.  All other systems reviewed and are negative.     Objective:   Physical Exam Vitals reviewed.  HENT:     Head: Normocephalic.  Cardiovascular:     Rate and Rhythm: Normal rate.     Pulses:          Radial pulses are 1+ on the right side and 1+ on the left side.  Pulmonary:     Effort: Pulmonary effort is normal.  Skin:    General: Skin is warm and dry.  Neurological:     Mental Status: He is alert and oriented to person, place, and time.  Psychiatric:        Mood and Affect: Mood normal.        Behavior: Behavior normal.        Thought Content: Thought content normal.        Judgment: Judgment normal.    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   CHF (congestive heart failure) (HCC)    Coronary artery disease    Diabetes mellitus without complication ([place])    Dialysis patient ([place])    M-W-F   GERD (gastroesophageal reflux disease)    Hypertension    Kyrle's disease    Renal disorder    on dialysis for 4 years three times a week last was  Tuesday   Secondary hyperparathyroidism of renal origin ([place])    Shortness of breath dyspnea    Type 2 diabetes mellitus with diabetic nephropathy (HCC)    Type 2 diabetes mellitus with diabetic retinopathy (HCC)     Social History   Socioeconomic History   Marital status: Single    Spouse name: Not on file   Number of children: Not on file   Years of education: Not on file   Highest education level: Not on file  Occupational History   Not on file  Tobacco Use   Smoking status: Never   Smokeless tobacco: Never  Vaping Use   Vaping Use: Never used  Substance and Sexual Activity   Alcohol use: Not Currently    Comment: occ. shot   Drug use: No   Sexual activity: Not on file  Other Topics Concern   Not on file  Social History Narrative   Lives by himself   Social Determinants of Health   Financial Resource Strain: Not on file  Food Insecurity: Not on file  Transportation Needs: Not on file  Physical Activity: Not on file  Stress: Not on file  Social Connections: Not on file  Intimate Partner Violence: Not on file    Past Surgical History:  Procedure Laterality Date   A/V FISTULAGRAM Left 10/01/2017   Procedure: A/V FISTULAGRAM;  Surgeon: Algernon Huxley, MD;  Location: [place] [facility];  Service: Cardiovascular;  Laterality: Left;   A/V FISTULAGRAM Left 05/17/2020   Procedure: A/V FISTULAGRAM;  Surgeon: Algernon Huxley, MD;  Location: [place] [facility];  Service: Cardiovascular;  Laterality: Left;   A/V SHUNTOGRAM Left 04/16/2021   Procedure: A/V SHUNTOGRAM;  Surgeon: Katha Cabal, MD;  Location: [place] [facility];  Service: Cardiovascular;  Laterality: Left;   A/V SHUNTOGRAM Left 08/29/2021   Procedure: A/V SHUNTOGRAM;  Surgeon: Algernon Huxley, MD;  Location: [place] [facility];  Service: Cardiovascular;  Laterality: Left;   CATARACT EXTRACTION W/PHACO Right 06/30/2018   Procedure: CATARACT EXTRACTION PHACO AND INTRAOCULAR LENS PLACEMENT (IOC)-RIGHT;  Surgeon:  Eulogio Bear, MD;  Location: [facility];  Service: Ophthalmology;  Laterality: Right;  [place] 00:07.0 CDE 0.16 FLUID PACK lOT # [id number]   CATARACT EXTRACTION W/PHACO Left 12/24/2018   Procedure: CATARACT EXTRACTION PHACO AND INTRAOCULAR LENS PLACEMENT (IOC);  Surgeon: Eulogio Bear, MD;  Location: [facility];  Service: Ophthalmology;  Laterality: Left;  [place] 00:30.0 CDE 2.01 FLUID PACK LOT # [id number]   COLONOSCOPY WITH PROPOFOL N/A 04/23/2017   Procedure: COLONOSCOPY WITH PROPOFOL;  Surgeon: [name], MD;  Location: [facility];  Service: Endoscopy;  Laterality: N/A;   COLONOSCOPY WITH PROPOFOL N/A 07/02/2017   Procedure: COLONOSCOPY WITH PROPOFOL;  Surgeon: [name], MD;  Location: [facility];  Service: Endoscopy;  Laterality: N/A;   DIALYSIS/PERMA CATHETER INSERTION N/A 09/27/2020   Procedure: DIALYSIS/PERMA CATHETER INSERTION;  Surgeon: Algernon Huxley, MD;  Location: [place] [facility];  Service: Cardiovascular;  Laterality: N/A;   DIALYSIS/PERMA CATHETER INSERTION N/A 09/19/2021   Procedure: DIALYSIS/PERMA CATHETER INSERTION;  Surgeon: Algernon Huxley, MD;  Location: [place] [facility];  Service: Cardiovascular;  Laterality: N/A;   DIALYSIS/PERMA CATHETER REMOVAL N/A 11/01/2020   Procedure: DIALYSIS/PERMA CATHETER REMOVAL;  Surgeon: Algernon Huxley, MD;  Location: [place] [facility];  Service: Cardiovascular;  Laterality: N/A;   ESOPHAGOGASTRODUODENOSCOPY (EGD) WITH PROPOFOL N/A 04/23/2017   Procedure: ESOPHAGOGASTRODUODENOSCOPY (EGD) WITH PROPOFOL;  Surgeon: [name], MD;  Location: [facility];  Service: Endoscopy;  Laterality: N/A;   ESOPHAGOGASTRODUODENOSCOPY (EGD) WITH PROPOFOL N/A 03/27/2020   Procedure: ESOPHAGOGASTRODUODENOSCOPY (EGD) WITH PROPOFOL;  Surgeon: Lesly Rubenstein, MD;  Location: [facility];  Service: Endoscopy;  Laterality: N/A;   EYE SURGERY     RETINA   INSERTION OF DIALYSIS CATHETER Right 08/09/2020   Procedure: INSERTION OF  DIALYSIS CATHETER;  Surgeon: Algernon Huxley, MD;  Location: [facility];  Service: Vascular;  Laterality: Right;   LEFT HEART CATH AND CORONARY ANGIOGRAPHY N/A 08/27/2021   Procedure: LEFT HEART CATH AND CORONARY ANGIOGRAPHY;  Surgeon: Dionisio David, MD;  Location: [place] [facility];  Service: Cardiovascular;  Laterality: N/A;   PERIPHERAL VASCULAR CATHETERIZATION N/A 05/07/2015   Procedure: A/V Shuntogram/Fistulagram;  Surgeon: Katha Cabal, MD;  Location: [place] [facility];  Service: Cardiovascular;  Laterality: N/A;   PERIPHERAL VASCULAR CATHETERIZATION Left 05/07/2015   Procedure: A/V Shunt Intervention;  Surgeon: Katha Cabal, MD;  Location: [place] [facility];  Service: Cardiovascular;  Laterality: Left;   PERIPHERAL VASCULAR THROMBECTOMY Left 07/16/2020   Procedure:  PERIPHERAL VASCULAR THROMBECTOMY;  Surgeon: Algernon Huxley, MD;  Location: [place] [facility];  Service: Cardiovascular;  Laterality: Left;   PERIPHERAL VASCULAR THROMBECTOMY Left 08/27/2020   Procedure: PERIPHERAL VASCULAR THROMBECTOMY;  Surgeon: Algernon Huxley, MD;  Location: [place] [facility];  Service: Cardiovascular;  Laterality: Left;   REVISON OF ARTERIOVENOUS FISTULA Left 08/09/2020   Procedure: REVISON OF ARTERIOVENOUS FISTULA;  Surgeon: Algernon Huxley, MD;  Location: [facility];  Service: Vascular;  Laterality: Left;   TEE WITHOUT CARDIOVERSION N/A 07/05/2015   Procedure: TRANSESOPHAGEAL ECHOCARDIOGRAM (TEE);  Surgeon: Dionisio David, MD;  Location: [facility];  Service: Cardiovascular;  Laterality: N/A;   UPPER EXTREMITY ANGIOGRAPHY Left 06/24/2018   Procedure: UPPER EXTREMITY ANGIOGRAPHY;  Surgeon: Algernon Huxley, MD;  Location: [place] [facility];  Service: Cardiovascular;  Laterality: Left;   VASCULAR ACCESS DEVICE INSERTION Left 07/11/2021   Procedure: INSERTION OF HERO VASCULAR ACCESS DEVICE;  Surgeon: Algernon Huxley, MD;  Location: [facility];  Service: Vascular;  Laterality: Left;   WOUND DEBRIDEMENT N/A 09/25/2020    Procedure: DEBRIDEMENT WOUND;  Surgeon: Katha Cabal, MD;  Location: [facility];  Service: Vascular;  Laterality: N/A;    Family History  Problem Relation Age of Onset   Diabetes Father     Allergies  Allergen Reactions   Cephalexin Nausea Only   Ivp Dye [Iodinated Contrast Media] Diarrhea   Lisinopril Other (See Comments)    Pt cannot recall reaction, cough   Other        Latest Ref Rng & Units 08/30/2021    8:54 AM 08/29/2021    1:52 PM 08/25/2021   10:18 PM  CBC  WBC 4.0 - 10.5 K/uL 13.8   11.7   7.8    Hemoglobin 13.0 - 17.0 g/dL 11.6   12.9   12.3    Hematocrit 39.0 - 52.0 % 37.4   42.2   40.4    Platelets 150 - 400 K/uL 200   198   215        CMP     Component Value Date/Time   NA 130 (L) 08/30/2021 0900   NA 138 02/24/2014 0359   K 4.0 08/30/2021 0900   K 3.5 02/24/2014 0359   CL 94 (L) 08/30/2021 0900   CL 97 (L) 02/24/2014 0359   CO2 20 (L) 08/30/2021 0900   CO2 31 02/24/2014 0359   GLUCOSE 422 (H) 08/30/2021 0900   GLUCOSE 231 (H) 02/24/2014 0359   BUN 51 (H) 08/30/2021 0900   BUN 38 (H) 02/24/2014 0359   CREATININE 13.93 (H) 08/30/2021 0900   CREATININE 12.04 (H) 02/24/2014 0359   CALCIUM 9.0 08/30/2021 0900   CALCIUM 7.9 (L) 02/24/2014 0359   PROT 7.7 08/25/2021 2218   PROT 7.3 02/22/2014 1526   ALBUMIN 3.2 (L) 08/30/2021 0900   ALBUMIN 3.2 (L) 02/22/2014 1526   AST 15 08/25/2021 2218   AST 27 02/22/2014 1526   ALT 9 08/25/2021 2218   ALT 17 02/22/2014 1526   ALKPHOS 74 08/25/2021 2218   ALKPHOS 73 02/22/2014 1526   BILITOT 0.4 08/25/2021 2218   BILITOT 0.8 02/22/2014 1526   GFRNONAA 4 (L) 08/30/2021 0900   GFRNONAA 5 (L) 02/24/2014 0359   GFRAA 3 (L) 05/07/2015 0937   GFRAA 6 (L) 02/24/2014 0359     No results found.     Assessment & Plan:   1. ESRD (end stage renal disease) ([place]) Recommend:  The patient is experiencing increasing  problems with their dialysis access.  Patient should have a fistulagram with the intention for  intervention.  The intention for intervention is to restore appropriate flow and prevent thrombosis and possible loss of the access.  As well as improve the quality of dialysis therapy.  The risks, benefits and alternative therapies were reviewed in detail with the patient.  All questions were answered.  The patient agrees to proceed with angio/intervention.    The patient will follow up with me in the office after the procedure.   - VAS US DUPLEX DIALYSIS ACCESS (AVF, AVG)  2. Primary hypertension Continue antihypertensive medications as already ordered, these medications have been reviewed and there are no changes at this time.   3. Diabetes mellitus with ESRD (end-stage renal disease) ([place]) Continue hypoglycemic medications as already ordered, these medications have been reviewed and there are no changes at this time.  Hgb A1C to be monitored as already arranged by primary service    Current Outpatient Medications on File Prior to Visit  Medication Sig Dispense Refill   aspirin EC 81 MG tablet Take 81 mg by mouth daily at 6 PM. (1700)     Beta Carotene (VITAMIN A) 25000 UNIT capsule Take 25,000 Units by mouth daily.     carvedilol (COREG) 25 MG tablet Take 25 mg by mouth 2 (two) times daily.     cinacalcet (SENSIPAR) 60 MG tablet Take 1 tablet by mouth daily.     cyclobenzaprine (FLEXERIL) 10 MG tablet Take 5 mg by mouth 2 (two) times daily as needed.     HUMULIN 70/30 (70-30) 100 UNIT/ML injection Inject 10-30 Units into the skin See admin instructions. 10 units before breakfast and 30 units before supper ON DIALYSIS DAYS (M-W-F), NO AM INSULIN     hydrOXYzine (ATARAX) 25 MG tablet Take 25 mg by mouth at bedtime.     Lidocaine HCl 4 % LIQD Apply topically.     loperamide (IMODIUM) 2 MG capsule Take 2 mg by mouth as needed for diarrhea or loose stools (Take 1/2-1 tablet as meeded to prevent diarrhea. Can take up to 1 tablet every 4 hours. Do not exceed 4 tablets in a day.).      losartan (COZAAR) 50 MG tablet Take 50 mg by mouth daily. (1700)     omeprazole (PRILOSEC) 40 MG capsule Take 40 mg by mouth daily.     sevelamer carbonate (RENVELA) 2.4 g PACK Take 2.4 g by mouth 3 (three) times daily with meals.     vitamin E 1000 UNIT capsule Take 1,000 Units by mouth daily. Not sure of dose; started 4/29     warfarin (COUMADIN) 2 MG tablet Take 1 tablet (2 mg total) by mouth daily. 30 tablet 0   No current facility-administered medications on file prior to visit.    There are no Patient Instructions on file for this visit. No follow-ups on file.   Kris Hartmann, NP

## 2021-09-25 NOTE — Progress Notes (Signed)
Subjective:    Patient ID: [name] [name], male    DOB: [birth year], 46 y.o.   MRN: [id number] No chief complaint on file.   [name] [name] is a 46 year old male who returns today for follow-up studies after recent thrombectomy that was required after the patient had a severe fall on his left upper extremity.  The patient notes that he has been having issues with dialysis since that time.  He notes that he has not been able to utilize his dialysis access.  He also notes that he is having problems with his current PermCath.  Today noninvasive studies show flow volume of 78 that shows he is totally occluded with an apparent pseudoaneurysms in proximal to the anastomosis site.  It is noted could also be the old AV fistula region.   Review of Systems  Musculoskeletal:  Positive for gait problem.  All other systems reviewed and are negative.     Objective:   Physical Exam Vitals reviewed.  HENT:     Head: Normocephalic.  Cardiovascular:     Rate and Rhythm: Normal rate.     Pulses:          Radial pulses are 1+ on the right side and 1+ on the left side.  Pulmonary:     Effort: Pulmonary effort is normal.  Skin:    General: Skin is warm and dry.  Neurological:     Mental Status: He is alert and oriented to person, place, and time.  Psychiatric:        Mood and Affect: Mood normal.        Behavior: Behavior normal.        Thought Content: Thought content normal.        Judgment: Judgment normal.    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   CHF (congestive heart failure) (HCC)    Coronary artery disease    Diabetes mellitus without complication ([place])    Dialysis patient ([place])    M-W-F   GERD (gastroesophageal reflux disease)    Hypertension    Kyrle's disease    Renal disorder    on dialysis for 4 years three times a week last was  Tuesday   Secondary hyperparathyroidism of renal origin ([place])    Shortness of breath dyspnea    Type 2 diabetes mellitus with diabetic nephropathy (HCC)    Type 2 diabetes mellitus with diabetic retinopathy (HCC)     Social History   Socioeconomic History   Marital status: Single    Spouse name: Not on file   Number of children: Not on file   Years of education: Not on file   Highest education level: Not on file  Occupational History   Not on file  Tobacco Use   Smoking status: Never   Smokeless tobacco: Never  Vaping Use   Vaping Use: Never used  Substance and Sexual Activity   Alcohol use: Not Currently    Comment: occ. shot   Drug use: No   Sexual activity: Not on file  Other Topics Concern   Not on file  Social History Narrative   Lives by himself   Social Determinants of Health   Financial Resource Strain: Not on file  Food Insecurity: Not on file  Transportation Needs: Not on file  Physical Activity: Not on file  Stress: Not on file  Social Connections: Not on file  Intimate Partner Violence: Not on file    Past Surgical History:  Procedure Laterality Date   A/V FISTULAGRAM Left 10/01/2017   Procedure: A/V FISTULAGRAM;  Surgeon: Algernon Huxley, MD;  Location: [place] [facility];  Service: Cardiovascular;  Laterality: Left;   A/V FISTULAGRAM Left 05/17/2020   Procedure: A/V FISTULAGRAM;  Surgeon: Algernon Huxley, MD;  Location: [place] [facility];  Service: Cardiovascular;  Laterality: Left;   A/V SHUNTOGRAM Left 04/16/2021   Procedure: A/V SHUNTOGRAM;  Surgeon: Katha Cabal, MD;  Location: [place] [facility];  Service: Cardiovascular;  Laterality: Left;   A/V SHUNTOGRAM Left 08/29/2021   Procedure: A/V SHUNTOGRAM;  Surgeon: Algernon Huxley, MD;  Location: [place] [facility];  Service: Cardiovascular;  Laterality: Left;   CATARACT EXTRACTION W/PHACO Right 06/30/2018   Procedure: CATARACT EXTRACTION PHACO AND INTRAOCULAR LENS PLACEMENT (IOC)-RIGHT;  Surgeon:  Eulogio Bear, MD;  Location: [facility];  Service: Ophthalmology;  Laterality: Right;  [place] 00:07.0 CDE 0.16 FLUID PACK lOT # [id number]   CATARACT EXTRACTION W/PHACO Left 12/24/2018   Procedure: CATARACT EXTRACTION PHACO AND INTRAOCULAR LENS PLACEMENT (IOC);  Surgeon: Eulogio Bear, MD;  Location: [facility];  Service: Ophthalmology;  Laterality: Left;  [place] 00:30.0 CDE 2.01 FLUID PACK LOT # [id number]   COLONOSCOPY WITH PROPOFOL N/A 04/23/2017   Procedure: COLONOSCOPY WITH PROPOFOL;  Surgeon: [name], MD;  Location: [facility] ENDOSCOPY;  Service: Endoscopy;  Laterality: N/A;   COLONOSCOPY WITH PROPOFOL N/A 07/02/2017   Procedure: COLONOSCOPY WITH PROPOFOL;  Surgeon: [name], MD;  Location: [facility] ENDOSCOPY;  Service: Endoscopy;  Laterality: N/A;   DIALYSIS/PERMA CATHETER INSERTION N/A 09/27/2020   Procedure: DIALYSIS/PERMA CATHETER INSERTION;  Surgeon: Algernon Huxley, MD;  Location: [place] [facility];  Service: Cardiovascular;  Laterality: N/A;   DIALYSIS/PERMA CATHETER INSERTION N/A 09/19/2021   Procedure: DIALYSIS/PERMA CATHETER INSERTION;  Surgeon: Algernon Huxley, MD;  Location: [place] [facility];  Service: Cardiovascular;  Laterality: N/A;   DIALYSIS/PERMA CATHETER REMOVAL N/A 11/01/2020   Procedure: DIALYSIS/PERMA CATHETER REMOVAL;  Surgeon: Algernon Huxley, MD;  Location: [place] [facility];  Service: Cardiovascular;  Laterality: N/A;   ESOPHAGOGASTRODUODENOSCOPY (EGD) WITH PROPOFOL N/A 04/23/2017   Procedure: ESOPHAGOGASTRODUODENOSCOPY (EGD) WITH PROPOFOL;  Surgeon: [name], MD;  Location: [facility] ENDOSCOPY;  Service: Endoscopy;  Laterality: N/A;   ESOPHAGOGASTRODUODENOSCOPY (EGD) WITH PROPOFOL N/A 03/27/2020   Procedure: ESOPHAGOGASTRODUODENOSCOPY (EGD) WITH PROPOFOL;  Surgeon: Lesly Rubenstein, MD;  Location: [facility] ENDOSCOPY;  Service: Endoscopy;  Laterality: N/A;   EYE SURGERY     RETINA   INSERTION OF DIALYSIS CATHETER Right 08/09/2020   Procedure: INSERTION OF  DIALYSIS CATHETER;  Surgeon: Algernon Huxley, MD;  Location: [facility];  Service: Vascular;  Laterality: Right;   LEFT HEART CATH AND CORONARY ANGIOGRAPHY N/A 08/27/2021   Procedure: LEFT HEART CATH AND CORONARY ANGIOGRAPHY;  Surgeon: Dionisio David, MD;  Location: [place] [facility];  Service: Cardiovascular;  Laterality: N/A;   PERIPHERAL VASCULAR CATHETERIZATION N/A 05/07/2015   Procedure: A/V Shuntogram/Fistulagram;  Surgeon: Katha Cabal, MD;  Location: [place] [facility];  Service: Cardiovascular;  Laterality: N/A;   PERIPHERAL VASCULAR CATHETERIZATION Left 05/07/2015   Procedure: A/V Shunt Intervention;  Surgeon: Katha Cabal, MD;  Location: [place] [facility];  Service: Cardiovascular;  Laterality: Left;   PERIPHERAL VASCULAR THROMBECTOMY Left 07/16/2020   Procedure:  PERIPHERAL VASCULAR THROMBECTOMY;  Surgeon: Algernon Huxley, MD;  Location: [place] [facility];  Service: Cardiovascular;  Laterality: Left;   PERIPHERAL VASCULAR THROMBECTOMY Left 08/27/2020   Procedure: PERIPHERAL VASCULAR THROMBECTOMY;  Surgeon: Algernon Huxley, MD;  Location: [place] [facility];  Service: Cardiovascular;  Laterality: Left;   REVISON OF ARTERIOVENOUS FISTULA Left 08/09/2020   Procedure: REVISON OF ARTERIOVENOUS FISTULA;  Surgeon: Algernon Huxley, MD;  Location: [facility];  Service: Vascular;  Laterality: Left;   TEE WITHOUT CARDIOVERSION N/A 07/05/2015   Procedure: TRANSESOPHAGEAL ECHOCARDIOGRAM (TEE);  Surgeon: Dionisio David, MD;  Location: [facility];  Service: Cardiovascular;  Laterality: N/A;   UPPER EXTREMITY ANGIOGRAPHY Left 06/24/2018   Procedure: UPPER EXTREMITY ANGIOGRAPHY;  Surgeon: Algernon Huxley, MD;  Location: [place] [facility];  Service: Cardiovascular;  Laterality: Left;   VASCULAR ACCESS DEVICE INSERTION Left 07/11/2021   Procedure: INSERTION OF HERO VASCULAR ACCESS DEVICE;  Surgeon: Algernon Huxley, MD;  Location: [facility];  Service: Vascular;  Laterality: Left;   WOUND DEBRIDEMENT N/A 09/25/2020    Procedure: DEBRIDEMENT WOUND;  Surgeon: Katha Cabal, MD;  Location: [facility];  Service: Vascular;  Laterality: N/A;    Family History  Problem Relation Age of Onset   Diabetes Father     Allergies  Allergen Reactions   Cephalexin Nausea Only   Ivp Dye [Iodinated Contrast Media] Diarrhea   Lisinopril Other (See Comments)    Pt cannot recall reaction, cough   Other        Latest Ref Rng & Units 08/30/2021    8:54 AM 08/29/2021    1:52 PM 08/25/2021   10:18 PM  CBC  WBC 4.0 - 10.5 K/uL 13.8   11.7   7.8    Hemoglobin 13.0 - 17.0 g/dL 11.6   12.9   12.3    Hematocrit 39.0 - 52.0 % 37.4   42.2   40.4    Platelets 150 - 400 K/uL 200   198   215        CMP     Component Value Date/Time   NA 130 (L) 08/30/2021 0900   NA 138 02/24/2014 0359   K 4.0 08/30/2021 0900   K 3.5 02/24/2014 0359   CL 94 (L) 08/30/2021 0900   CL 97 (L) 02/24/2014 0359   CO2 20 (L) 08/30/2021 0900   CO2 31 02/24/2014 0359   GLUCOSE 422 (H) 08/30/2021 0900   GLUCOSE 231 (H) 02/24/2014 0359   BUN 51 (H) 08/30/2021 0900   BUN 38 (H) 02/24/2014 0359   CREATININE 13.93 (H) 08/30/2021 0900   CREATININE 12.04 (H) 02/24/2014 0359   CALCIUM 9.0 08/30/2021 0900   CALCIUM 7.9 (L) 02/24/2014 0359   PROT 7.7 08/25/2021 2218   PROT 7.3 02/22/2014 1526   ALBUMIN 3.2 (L) 08/30/2021 0900   ALBUMIN 3.2 (L) 02/22/2014 1526   AST 15 08/25/2021 2218   AST 27 02/22/2014 1526   ALT 9 08/25/2021 2218   ALT 17 02/22/2014 1526   ALKPHOS 74 08/25/2021 2218   ALKPHOS 73 02/22/2014 1526   BILITOT 0.4 08/25/2021 2218   BILITOT 0.8 02/22/2014 1526   GFRNONAA 4 (L) 08/30/2021 0900   GFRNONAA 5 (L) 02/24/2014 0359   GFRAA 3 (L) 05/07/2015 0937   GFRAA 6 (L) 02/24/2014 0359     No results found.     Assessment & Plan:   1. ESRD (end stage renal disease) ([place]) Recommend:  The patient is experiencing increasing  problems with their dialysis access.  Patient should have a fistulagram with the intention for  intervention.  The intention for intervention is to restore appropriate flow and prevent thrombosis and possible loss of the access.  As well as improve the quality of dialysis therapy.  The risks, benefits and alternative therapies were reviewed in detail with the patient.  All questions were answered.  The patient agrees to proceed with angio/intervention.    The patient will follow up with me in the office after the procedure.   - VAS US DUPLEX DIALYSIS ACCESS (AVF, AVG)  2. Primary hypertension Continue antihypertensive medications as already ordered, these medications have been reviewed and there are no changes at this time.   3. Diabetes mellitus with ESRD (end-stage renal disease) ([place]) Continue hypoglycemic medications as already ordered, these medications have been reviewed and there are no changes at this time.  Hgb A1C to be monitored as already arranged by primary service    Current Outpatient Medications on File Prior to Visit  Medication Sig Dispense Refill   aspirin EC 81 MG tablet Take 81 mg by mouth daily at 6 PM. (1700)     Beta Carotene (VITAMIN A) 25000 UNIT capsule Take 25,000 Units by mouth daily.     carvedilol (COREG) 25 MG tablet Take 25 mg by mouth 2 (two) times daily.     cinacalcet (SENSIPAR) 60 MG tablet Take 1 tablet by mouth daily.     cyclobenzaprine (FLEXERIL) 10 MG tablet Take 5 mg by mouth 2 (two) times daily as needed.     HUMULIN 70/30 (70-30) 100 UNIT/ML injection Inject 10-30 Units into the skin See admin instructions. 10 units before breakfast and 30 units before supper ON DIALYSIS DAYS (M-W-F), NO AM INSULIN     hydrOXYzine (ATARAX) 25 MG tablet Take 25 mg by mouth at bedtime.     Lidocaine HCl 4 % LIQD Apply topically.     loperamide (IMODIUM) 2 MG capsule Take 2 mg by mouth as needed for diarrhea or loose stools (Take 1/2-1 tablet as meeded to prevent diarrhea. Can take up to 1 tablet every 4 hours. Do not exceed 4 tablets in a day.).      losartan (COZAAR) 50 MG tablet Take 50 mg by mouth daily. (1700)     omeprazole (PRILOSEC) 40 MG capsule Take 40 mg by mouth daily.     sevelamer carbonate (RENVELA) 2.4 g PACK Take 2.4 g by mouth 3 (three) times daily with meals.     vitamin E 1000 UNIT capsule Take 1,000 Units by mouth daily. Not sure of dose; started 4/29     warfarin (COUMADIN) 2 MG tablet Take 1 tablet (2 mg total) by mouth daily. 30 tablet 0   No current facility-administered medications on file prior to visit.    There are no Patient Instructions on file for this visit. No follow-ups on file.   Kris Hartmann, NP

## 2021-09-26 ENCOUNTER — Other Ambulatory Visit: Payer: Self-pay

## 2021-09-26 ENCOUNTER — Encounter: Payer: Self-pay | Admitting: Vascular Surgery

## 2021-09-26 ENCOUNTER — Ambulatory Visit
Admission: RE | Admit: 2021-09-26 | Discharge: 2021-09-26 | Disposition: A | Discharge status: Home / Self Care | Payer: Medicare Other | Attending: Vascular Surgery | Admitting: Vascular Surgery

## 2021-09-26 ENCOUNTER — Encounter
Admission: RE | Disposition: A | Discharge status: Home / Self Care | Payer: Self-pay | Source: Home / Self Care | Attending: Vascular Surgery

## 2021-09-26 ENCOUNTER — Encounter (INDEPENDENT_AMBULATORY_CARE_PROVIDER_SITE_OTHER): Payer: Self-pay | Admitting: Nurse Practitioner

## 2021-09-26 DIAGNOSIS — I132 Hypertensive heart and chronic kidney disease with heart failure and with stage 5 chronic kidney disease, or end stage renal disease: Secondary | ICD-10-CM | POA: Insufficient documentation

## 2021-09-26 DIAGNOSIS — Y841 Kidney dialysis as the cause of abnormal reaction of the patient, or of later complication, without mention of misadventure at the time of the procedure: Secondary | ICD-10-CM | POA: Diagnosis not present

## 2021-09-26 DIAGNOSIS — Z794 Long term (current) use of insulin: Secondary | ICD-10-CM | POA: Insufficient documentation

## 2021-09-26 DIAGNOSIS — I509 Heart failure, unspecified: Secondary | ICD-10-CM | POA: Insufficient documentation

## 2021-09-26 DIAGNOSIS — T82898A Other specified complication of vascular prosthetic devices, implants and grafts, initial encounter: Secondary | ICD-10-CM | POA: Insufficient documentation

## 2021-09-26 DIAGNOSIS — N186 End stage renal disease: Secondary | ICD-10-CM | POA: Diagnosis not present

## 2021-09-26 DIAGNOSIS — E1122 Type 2 diabetes mellitus with diabetic chronic kidney disease: Secondary | ICD-10-CM | POA: Diagnosis not present

## 2021-09-26 DIAGNOSIS — Z992 Dependence on renal dialysis: Secondary | ICD-10-CM | POA: Insufficient documentation

## 2021-09-26 HISTORY — PX: PERIPHERAL VASCULAR THROMBECTOMY: CATH118306

## 2021-09-26 LAB — PROTIME-INR
INR: 1.1 (ref 0.8–1.2)
Prothrombin Time: 13.7 seconds (ref 11.4–15.2)

## 2021-09-26 LAB — GLUCOSE, CAPILLARY: Glucose-Capillary: 361 mg/dL — ABNORMAL HIGH (ref 70–99)

## 2021-09-26 LAB — POTASSIUM (ARMC VASCULAR LAB ONLY): Potassium (ARMC vascular lab): 4 mmol/L (ref 3.5–5.1)

## 2021-09-26 SURGERY — PERIPHERAL VASCULAR THROMBECTOMY
Anesthesia: Moderate Sedation

## 2021-09-26 MED ORDER — MIDAZOLAM HCL 2 MG/2ML IJ SOLN
INTRAMUSCULAR | Status: AC
  Filled 2021-09-26: qty 2

## 2021-09-26 MED ORDER — HEPARIN SODIUM (PORCINE) 1000 UNIT/ML IJ SOLN
INTRAMUSCULAR | Status: AC
  Filled 2021-09-26: qty 10

## 2021-09-26 MED ORDER — CEFAZOLIN SODIUM-DEXTROSE 2-4 GM/100ML-% IV SOLN: 2.0000 g | INTRAVENOUS | Status: DC

## 2021-09-26 MED ORDER — CEFAZOLIN SODIUM-DEXTROSE 1-4 GM/50ML-% IV SOLN
INTRAVENOUS | Status: AC
  Administered 2021-09-26: 1 g via INTRAVENOUS
  Filled 2021-09-26: qty 50

## 2021-09-26 MED ORDER — DIPHENHYDRAMINE HCL 50 MG/ML IJ SOLN: 50.0000 mg | Freq: Once | INTRAMUSCULAR | Status: DC | PRN

## 2021-09-26 MED ORDER — FENTANYL CITRATE (PF) 100 MCG/2ML IJ SOLN
INTRAMUSCULAR | Status: AC
  Filled 2021-09-26: qty 2

## 2021-09-26 MED ORDER — MIDAZOLAM HCL 2 MG/ML PO SYRP: 8.0000 mg | ORAL_SOLUTION | Freq: Once | ORAL | Status: DC | PRN

## 2021-09-26 MED ORDER — MIDAZOLAM HCL 2 MG/2ML IJ SOLN
INTRAMUSCULAR | Status: DC | PRN
  Administered 2021-09-26: 1 mg via INTRAVENOUS
  Administered 2021-09-26: 2 mg via INTRAVENOUS

## 2021-09-26 MED ORDER — FAMOTIDINE 20 MG PO TABS: 40.0000 mg | ORAL_TABLET | Freq: Once | ORAL | Status: DC | PRN

## 2021-09-26 MED ORDER — METHYLPREDNISOLONE SODIUM SUCC 125 MG IJ SOLR: 125.0000 mg | Freq: Once | INTRAMUSCULAR | Status: DC | PRN

## 2021-09-26 MED ORDER — CEFAZOLIN SODIUM-DEXTROSE 1-4 GM/50ML-% IV SOLN: 1.0000 g | INTRAVENOUS | Status: AC

## 2021-09-26 MED ORDER — ONDANSETRON HCL 4 MG/2ML IJ SOLN: 4.0000 mg | Freq: Four times a day (QID) | INTRAMUSCULAR | Status: DC | PRN

## 2021-09-26 MED ORDER — SODIUM CHLORIDE 0.9 % IV SOLN: INTRAVENOUS | Status: DC

## 2021-09-26 MED ORDER — HYDROMORPHONE HCL 1 MG/ML IJ SOLN: 1.0000 mg | Freq: Once | INTRAMUSCULAR | Status: DC | PRN

## 2021-09-26 MED ORDER — FENTANYL CITRATE (PF) 100 MCG/2ML IJ SOLN
INTRAMUSCULAR | Status: DC | PRN
Start: 2021-09-26 — End: 2021-09-26
  Administered 2021-09-26: 25 ug via INTRAVENOUS
  Administered 2021-09-26: 50 ug via INTRAVENOUS

## 2021-09-26 SURGICAL SUPPLY — 6 items
CANNULA 5F STIFF (CANNULA) ×1 IMPLANT
COVER PROBE U/S 5X48 (MISCELLANEOUS) ×1 IMPLANT
DRAPE BRACHIAL (DRAPES) ×1 IMPLANT
PACK ANGIOGRAPHY (CUSTOM PROCEDURE TRAY) ×2 IMPLANT
SHEATH BRITE TIP 6FRX5.5 (SHEATH) ×1 IMPLANT
SUT MNCRL AB 4-0 PS2 18 (SUTURE) ×1 IMPLANT

## 2021-09-26 NOTE — Op Note (Signed)
[facility]    OPERATIVE NOTE   PROCEDURE: 1.  Left arm HeRO graft cannulation under ultrasound guidance 2.  Left arm shuntogram   PRE-OPERATIVE DIAGNOSIS: 1. ESRD 2. Malfunctioning left arm HeRO arteriovenous graft  POST-OPERATIVE DIAGNOSIS: same as above   SURGEON: [name], MD  ANESTHESIA: local with MCS  ESTIMATED BLOOD LOSS: 5 cc  FINDING(S): Widely patent left arm hero graft with no evidence of stenosis.  SPECIMEN(S):  None  CONTRAST: 10 cc  FLUORO TIME: 0.1 minutes  MODERATE CONSCIOUS SEDATION TIME:  Approximately 12 minutes using 3 mg of Versed and 75 mcg of Fentanyl  INDICATIONS: [name] [name] is a 46 y.o. male who presents with malfunctioning left arm hero graft.  The patient is scheduled for left arm shuntogram.  The patient is aware the risks include but are not limited to: bleeding, infection, thrombosis of the cannulated access, and possible anaphylactic reaction to the contrast.  The patient is aware of the risks of the procedure and elects to proceed forward.  DESCRIPTION: After full informed written consent was obtained, the patient was brought back to the angiography suite and placed supine upon the angiography table.  The patient was connected to monitoring equipment. Moderate conscious sedation was administered during a face to face encounter throughout the procedure with my supervision of the RN administering medicines and monitoring the patient's vital signs, pulse oximetry, telemetry and mental status throughout from the start of the procedure until the patient was taken to the recovery room The left arm was prepped and draped in the standard fashion for a percutaneous access intervention.  Under ultrasound guidance, the left arm hero arteriovenous graft was cannulated with a micropuncture needle under direct ultrasound guidance were it was patent and a permanent image was performed.  The microwire was advanced into the graft and  the needle was exchanged for the a microsheath.   Hand injections were completed to image the access including the central venous system. This demonstrated a widely patent left arm hero graft.  Based on the images, this patient will need no intervention.  A 4-0 Monocryl purse-string suture was sewn around the sheath.  The sheath was removed while tying down the suture.  A sterile bandage was applied to the puncture site.  COMPLICATIONS: None  CONDITION: Stable   [name]  09/26/2021 1:38 PM    This note was created with Dragon Medical transcription system. Any errors in dictation are purely unintentional.

## 2021-09-26 NOTE — Interval H&P Note (Signed)
History and Physical Interval Note:  09/26/2021 12:29 PM  [name] [name]  has presented today for surgery, with the diagnosis of LUE [place]   End Stage Renal.  The various methods of treatment have been discussed with the patient and family. After consideration of risks, benefits and other options for treatment, the patient has consented to  Procedure(s): PERIPHERAL VASCULAR THROMBECTOMY (N/A) as a surgical intervention.  The patient's history has been reviewed, patient examined, no change in status, stable for surgery.  I have reviewed the patient's chart and labs.  Questions were answered to the patient's satisfaction.     [name]

## 2021-10-02 ENCOUNTER — Other Ambulatory Visit (INDEPENDENT_AMBULATORY_CARE_PROVIDER_SITE_OTHER): Payer: Self-pay | Admitting: Vascular Surgery

## 2021-10-02 DIAGNOSIS — N186 End stage renal disease: Secondary | ICD-10-CM

## 2021-10-03 ENCOUNTER — Ambulatory Visit (INDEPENDENT_AMBULATORY_CARE_PROVIDER_SITE_OTHER): Payer: Medicare Other | Admitting: Nurse Practitioner

## 2021-10-03 ENCOUNTER — Ambulatory Visit (INDEPENDENT_AMBULATORY_CARE_PROVIDER_SITE_OTHER): Payer: Medicare Other

## 2021-10-03 ENCOUNTER — Encounter (INDEPENDENT_AMBULATORY_CARE_PROVIDER_SITE_OTHER): Payer: Medicare Other

## 2021-10-03 ENCOUNTER — Encounter (INDEPENDENT_AMBULATORY_CARE_PROVIDER_SITE_OTHER): Payer: Self-pay | Admitting: Nurse Practitioner

## 2021-10-03 VITALS — BP 128/71 | HR 85 | Resp 18 | Ht 72.0 in | Wt 285.0 lb

## 2021-10-03 DIAGNOSIS — I10 Essential (primary) hypertension: Secondary | ICD-10-CM

## 2021-10-03 DIAGNOSIS — E1122 Type 2 diabetes mellitus with diabetic chronic kidney disease: Secondary | ICD-10-CM

## 2021-10-03 DIAGNOSIS — N186 End stage renal disease: Secondary | ICD-10-CM | POA: Diagnosis not present

## 2021-10-16 ENCOUNTER — Encounter (INDEPENDENT_AMBULATORY_CARE_PROVIDER_SITE_OTHER): Payer: Self-pay | Admitting: Nurse Practitioner

## 2021-10-16 NOTE — Progress Notes (Signed)
Subjective:    Patient ID: [name] [name], male    DOB: [birth year], 46 y.o.   MRN: [id number] No chief complaint on file.   The patient returns to the office for followup status post intervention of their dialysis access 09/26/2021.   Following the intervention the access function has significantly improved, with better flow rates and improved KT/V. The patient has not been experiencing increased bleeding times following decannulation and the patient denies increased recirculation. The patient denies an increase in arm swelling. At the present time the patient denies hand pain.  No recent shortening of the patient's walking distance or new symptoms consistent with claudication.  No history of rest pain symptoms. No new ulcers or wounds of the lower extremities have occurred.  The patient denies amaurosis fugax or recent TIA symptoms. There are no recent neurological changes noted. There is no history of DVT, PE or superficial thrombophlebitis. No recent episodes of angina or shortness of breath documented.   Duplex ultrasound of the AV access shows a patent access.  The previously noted stenosis is improved compared to last study.  Flow volume today is 2262 cc/min (previous flow volume was 0 cc/min)        Review of Systems     Objective:   Physical Exam Vitals reviewed.  HENT:     Head: Normocephalic.  Cardiovascular:     Rate and Rhythm: Normal rate.     Pulses: Normal pulses.  Pulmonary:     Effort: Pulmonary effort is normal.  Skin:    General: Skin is warm and dry.  Neurological:     Mental Status: He is alert and oriented to person, place, and time.  Psychiatric:        Mood and Affect: Mood normal.        Behavior: Behavior normal.        Thought Content: Thought content normal.        Judgment: Judgment normal.     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   CHF (congestive heart failure) (HCC)    Coronary artery disease    Diabetes mellitus without complication ([place])    Dialysis patient ([place])    M-W-F   GERD (gastroesophageal reflux disease)    Hypertension    Kyrle's disease    Renal disorder    on dialysis for 4 years three times a week last was Tuesday   Secondary hyperparathyroidism of renal origin ([place])    Shortness of breath dyspnea    Type 2 diabetes mellitus with diabetic nephropathy (HCC)    Type 2 diabetes mellitus with diabetic retinopathy (HCC)     Social History   Socioeconomic History   Marital status: Single    Spouse name: Not on file   Number of children: Not on file   Years of education: Not on file   Highest education level: Not on file  Occupational History   Not on file  Tobacco Use   Smoking status: Never   Smokeless tobacco: Never  Vaping Use   Vaping Use: Never used  Substance and Sexual Activity   Alcohol use: Not Currently    Comment: occ. shot   Drug use: No   Sexual activity: Not on file  Other Topics  Concern   Not on file  Social History Narrative   Lives by himself   Social Determinants of Health   Financial Resource Strain: Not on file  Food Insecurity: Not on file  Transportation Needs: Not on file  Physical Activity: Not on file  Stress: Not on file  Social Connections: Not on file  Intimate Partner Violence: Not on file    Past Surgical History:  Procedure Laterality Date   A/V FISTULAGRAM Left 10/01/2017   Procedure: A/V FISTULAGRAM;  Surgeon: Algernon Huxley, MD;  Location: [place] [facility];  Service: Cardiovascular;  Laterality: Left;   A/V FISTULAGRAM Left 05/17/2020   Procedure: A/V FISTULAGRAM;  Surgeon: Algernon Huxley, MD;  Location: [place] [facility];  Service: Cardiovascular;  Laterality: Left;   A/V SHUNTOGRAM Left 04/16/2021   Procedure: A/V SHUNTOGRAM;  Surgeon: Katha Cabal, MD;  Location: [place] [facility];  Service: Cardiovascular;  Laterality: Left;   A/V SHUNTOGRAM Left 08/29/2021   Procedure: A/V SHUNTOGRAM;  Surgeon: Algernon Huxley, MD;  Location: [place] [facility];  Service: Cardiovascular;  Laterality: Left;   CATARACT EXTRACTION W/PHACO Right 06/30/2018   Procedure: CATARACT EXTRACTION PHACO AND INTRAOCULAR LENS PLACEMENT (IOC)-RIGHT;  Surgeon: Eulogio Bear, MD;  Location: [facility];  Service: Ophthalmology;  Laterality: Right;  [place] 00:07.0 CDE 0.16 FLUID PACK lOT # [id number]   CATARACT EXTRACTION W/PHACO Left 12/24/2018   Procedure: CATARACT EXTRACTION PHACO AND INTRAOCULAR LENS PLACEMENT (IOC);  Surgeon: Eulogio Bear, MD;  Location: [facility];  Service: Ophthalmology;  Laterality: Left;  [place] 00:30.0 CDE 2.01 FLUID PACK LOT # [id number]   COLONOSCOPY WITH PROPOFOL N/A 04/23/2017   Procedure: COLONOSCOPY WITH PROPOFOL;  Surgeon: [name], MD;  Location: [facility] ENDOSCOPY;  Service: Endoscopy;  Laterality: N/A;   COLONOSCOPY WITH PROPOFOL N/A 07/02/2017   Procedure: COLONOSCOPY WITH PROPOFOL;  Surgeon: [name], MD;  Location: [facility] END[facility]COPY;  Service: Endoscopy;  Laterality: N/A;   DIALYSIS/PERMA CATHETER INSERTION N/A 09/27/2020   Procedure: DIALYSIS/PERMA CATHETER INSERTION;  Surgeon: Algernon Huxley, MD;  Location: [place] [facility];  Service: Cardiovascular;  Laterality: N/A;   DIALYSIS/PERMA CATHETER INSERTION N/A 09/19/2021   Procedure: DIALYSIS/PERMA CATHETER INSERTION;  Surgeon: Algernon Huxley, MD;  Location: [place] [facility];  Service: Cardiovascular;  Laterality: N/A;   DIALYSIS/PERMA CATHETER REMOVAL N/A 11/01/2020   Procedure: DIALYSIS/PERMA CATHETER REMOVAL;  Surgeon: Algernon Huxley, MD;  Location: [place] [facility];  Service: Cardiovascular;  Laterality: N/A;   ESOPHAGOGASTRODUODENOSCOPY (EGD) WITH PROPOFOL N/A 04/23/2017   Procedure: ESOPHAGOGASTRODUODENOSCOPY (EGD) WITH PROPOFOL;  Surgeon: [name], MD;  Location: [facility] ENDOSCOPY;   Service: Endoscopy;  Laterality: N/A;   ESOPHAGOGASTRODUODENOSCOPY (EGD) WITH PROPOFOL N/A 03/27/2020   Procedure: ESOPHAGOGASTRODUODENOSCOPY (EGD) WITH PROPOFOL;  Surgeon: Lesly Rubenstein, MD;  Location: [facility] ENDOSCOPY;  Service: Endoscopy;  Laterality: N/A;   EYE SURGERY     RETINA   INSERTION OF DIALYSIS CATHETER Right 08/09/2020   Procedure: INSERTION OF DIALYSIS CATHETER;  Surgeon: Algernon Huxley, MD;  Location: [facility];  Service: Vascular;  Laterality: Right;   LEFT HEART CATH AND CORONARY ANGIOGRAPHY N/A 08/27/2021   Procedure: LEFT HEART CATH AND CORONARY ANGIOGRAPHY;  Surgeon: Dionisio David, MD;  Location: [place] [facility];  Service: Cardiovascular;  Laterality: N/A;   PERIPHERAL VASCULAR CATHETERIZATION N/A 05/07/2015   Procedure: A/V Shuntogram/Fistulagram;  Surgeon: Katha Cabal, MD;  Location: [place] [facility];  Service: Cardiovascular;  Laterality:  N/A;   PERIPHERAL VASCULAR CATHETERIZATION Left 05/07/2015   Procedure: A/V Shunt Intervention;  Surgeon: Katha Cabal, MD;  Location: [place] [facility];  Service: Cardiovascular;  Laterality: Left;   PERIPHERAL VASCULAR THROMBECTOMY Left 07/16/2020   Procedure: PERIPHERAL VASCULAR THROMBECTOMY;  Surgeon: Algernon Huxley, MD;  Location: [place] [facility];  Service: Cardiovascular;  Laterality: Left;   PERIPHERAL VASCULAR THROMBECTOMY Left 08/27/2020   Procedure: PERIPHERAL VASCULAR THROMBECTOMY;  Surgeon: Algernon Huxley, MD;  Location: [place] [facility];  Service: Cardiovascular;  Laterality: Left;   PERIPHERAL VASCULAR THROMBECTOMY N/A 09/26/2021   Procedure: PERIPHERAL VASCULAR THROMBECTOMY;  Surgeon: Algernon Huxley, MD;  Location: [place] [facility];  Service: Cardiovascular;  Laterality: N/A;   REVISON OF ARTERIOVENOUS FISTULA Left 08/09/2020   Procedure: REVISON OF ARTERIOVENOUS FISTULA;  Surgeon: Algernon Huxley, MD;  Location: [facility];  Service: Vascular;  Laterality: Left;   TEE WITHOUT CARDIOVERSION N/A 07/05/2015    Procedure: TRANSESOPHAGEAL ECHOCARDIOGRAM (TEE);  Surgeon: Dionisio David, MD;  Location: [facility];  Service: Cardiovascular;  Laterality: N/A;   UPPER EXTREMITY ANGIOGRAPHY Left 06/24/2018   Procedure: UPPER EXTREMITY ANGIOGRAPHY;  Surgeon: Algernon Huxley, MD;  Location: [place] [facility];  Service: Cardiovascular;  Laterality: Left;   VASCULAR ACCESS DEVICE INSERTION Left 07/11/2021   Procedure: INSERTION OF HERO VASCULAR ACCESS DEVICE;  Surgeon: Algernon Huxley, MD;  Location: [facility];  Service: Vascular;  Laterality: Left;   WOUND DEBRIDEMENT N/A 09/25/2020   Procedure: DEBRIDEMENT WOUND;  Surgeon: Katha Cabal, MD;  Location: [facility];  Service: Vascular;  Laterality: N/A;    Family History  Problem Relation Age of Onset   Diabetes Father     Allergies  Allergen Reactions   Cephalexin Nausea Only   Ivp Dye [Iodinated Contrast Media] Diarrhea   Lisinopril Other (See Comments)    Pt cannot recall reaction, cough   Other        Latest Ref Rng & Units 08/30/2021    8:54 AM 08/29/2021    1:52 PM 08/25/2021   10:18 PM  CBC  WBC 4.0 - 10.5 K/uL 13.8  11.7  7.8   Hemoglobin 13.0 - 17.0 g/dL 11.6  12.9  12.3   Hematocrit 39.0 - 52.0 % 37.4  42.2  40.4   Platelets 150 - 400 K/uL 200  198  215       CMP     Component Value Date/Time   NA 130 (L) 08/30/2021 0900   NA 138 02/24/2014 0359   K 4.0 08/30/2021 0900   K 3.5 02/24/2014 0359   CL 94 (L) 08/30/2021 0900   CL 97 (L) 02/24/2014 0359   CO2 20 (L) 08/30/2021 0900   CO2 31 02/24/2014 0359   GLUCOSE 422 (H) 08/30/2021 0900   GLUCOSE 231 (H) 02/24/2014 0359   BUN 51 (H) 08/30/2021 0900   BUN 38 (H) 02/24/2014 0359   CREATININE 13.93 (H) 08/30/2021 0900   CREATININE 12.04 (H) 02/24/2014 0359   CALCIUM 9.0 08/30/2021 0900   CALCIUM 7.9 (L) 02/24/2014 0359   PROT 7.7 08/25/2021 2218   PROT 7.3 02/22/2014 1526   ALBUMIN 3.2 (L) 08/30/2021 0900   ALBUMIN 3.2 (L) 02/22/2014 1526   AST 15 08/25/2021 2218   AST 27  02/22/2014 1526   ALT 9 08/25/2021 2218   ALT 17 02/22/2014 1526   ALKPHOS 74 08/25/2021 2218   ALKPHOS 73 02/22/2014 1526   BILITOT 0.4 08/25/2021 2218  BILITOT 0.8 02/22/2014 1526   GFRNONAA 4 (L) 08/30/2021 0900   GFRNONAA 5 (L) 02/24/2014 0359   GFRAA 3 (L) 05/07/2015 0937   GFRAA 6 (L) 02/24/2014 0359     No results found.     Assessment & Plan:   1. ESRD (end stage renal disease) ([place]) Recommend:  The patient is doing well and currently has adequate dialysis access. The patient's dialysis center is not reporting any access issues. Flow pattern is stable when compared to the prior ultrasound.  The patient should have a duplex ultrasound of the dialysis access in 6 months. The patient will follow-up with me in the office after each ultrasound     2. Primary hypertension Continue antihypertensive medications as already ordered, these medications have been reviewed and there are no changes at this time.   3. Diabetes mellitus with ESRD (end-stage renal disease) ([place]) Continue hypoglycemic medications as already ordered, these medications have been reviewed and there are no changes at this time.  Hgb A1C to be monitored as already arranged by primary service    Current Outpatient Medications on File Prior to Visit  Medication Sig Dispense Refill   aspirin EC 81 MG tablet Take 81 mg by mouth daily at 6 PM. (1700)     Beta Carotene (VITAMIN A) 25000 UNIT capsule Take 25,000 Units by mouth daily.     carvedilol (COREG) 25 MG tablet Take 25 mg by mouth 2 (two) times daily.     cinacalcet (SENSIPAR) 60 MG tablet Take 1 tablet by mouth daily.     cyclobenzaprine (FLEXERIL) 10 MG tablet Take 5 mg by mouth 2 (two) times daily as needed.     HUMULIN 70/30 (70-30) 100 UNIT/ML injection Inject 10-30 Units into the skin See admin instructions. 10 units before breakfast and 30 units before supper ON DIALYSIS DAYS (M-W-F), NO AM INSULIN     hydrOXYzine (ATARAX) 25 MG tablet Take  25 mg by mouth at bedtime.     Lidocaine HCl 4 % LIQD Apply topically.     loperamide (IMODIUM) 2 MG capsule Take 2 mg by mouth as needed for diarrhea or loose stools (Take 1/2-1 tablet as meeded to prevent diarrhea. Can take up to 1 tablet every 4 hours. Do not exceed 4 tablets in a day.).     losartan (COZAAR) 50 MG tablet Take 50 mg by mouth daily. (1700)     omeprazole (PRILOSEC) 40 MG capsule Take 40 mg by mouth daily.     sevelamer carbonate (RENVELA) 2.4 g PACK Take 2.4 g by mouth 3 (three) times daily with meals.     vitamin E 1000 UNIT capsule Take 1,000 Units by mouth daily. Not sure of dose; started 4/29     warfarin (COUMADIN) 2 MG tablet Take 1 tablet (2 mg total) by mouth daily. 30 tablet 0   No current facility-administered medications on file prior to visit.    There are no Patient Instructions on file for this visit. No follow-ups on file.   Kris Hartmann, NP

## 2021-10-26 ENCOUNTER — Emergency Department: Payer: Medicare Other

## 2021-10-26 ENCOUNTER — Emergency Department
Admission: EM | Admit: 2021-10-26 | Discharge: 2021-10-27 | Disposition: A | Discharge status: Home / Self Care | Payer: Medicare Other | Attending: Emergency Medicine | Admitting: Emergency Medicine

## 2021-10-26 ENCOUNTER — Other Ambulatory Visit: Payer: Self-pay

## 2021-10-26 DIAGNOSIS — E162 Hypoglycemia, unspecified: Secondary | ICD-10-CM

## 2021-10-26 DIAGNOSIS — N186 End stage renal disease: Secondary | ICD-10-CM | POA: Insufficient documentation

## 2021-10-26 DIAGNOSIS — E1122 Type 2 diabetes mellitus with diabetic chronic kidney disease: Secondary | ICD-10-CM | POA: Insufficient documentation

## 2021-10-26 DIAGNOSIS — Z992 Dependence on renal dialysis: Secondary | ICD-10-CM | POA: Diagnosis not present

## 2021-10-26 DIAGNOSIS — E11649 Type 2 diabetes mellitus with hypoglycemia without coma: Secondary | ICD-10-CM | POA: Insufficient documentation

## 2021-10-26 DIAGNOSIS — I509 Heart failure, unspecified: Secondary | ICD-10-CM | POA: Insufficient documentation

## 2021-10-26 LAB — COMPREHENSIVE METABOLIC PANEL
ALT: 7 U/L (ref 0–44)
AST: 14 U/L — ABNORMAL LOW (ref 15–41)
Albumin: 3.3 g/dL — ABNORMAL LOW (ref 3.5–5.0)
Alkaline Phosphatase: 61 U/L (ref 38–126)
Anion gap: 8 (ref 5–15)
BUN: 26 mg/dL — ABNORMAL HIGH (ref 6–20)
CO2: 25 mmol/L (ref 22–32)
Calcium: 7.9 mg/dL — ABNORMAL LOW (ref 8.9–10.3)
Chloride: 101 mmol/L (ref 98–111)
Creatinine, Ser: 12.18 mg/dL — ABNORMAL HIGH (ref 0.61–1.24)
GFR, Estimated: 5 mL/min — ABNORMAL LOW (ref >60)
Glucose, Bld: 164 mg/dL — ABNORMAL HIGH (ref 70–99)
Potassium: 2.8 mmol/L — ABNORMAL LOW (ref 3.5–5.1)
Sodium: 134 mmol/L — ABNORMAL LOW (ref 135–145)
Total Bilirubin: 0.7 mg/dL (ref 0.3–1.2)
Total Protein: 7.5 g/dL (ref 6.5–8.1)

## 2021-10-26 LAB — CBG MONITORING, ED
Glucose-Capillary: 124 mg/dL — ABNORMAL HIGH (ref 70–99)
Glucose-Capillary: 130 mg/dL — ABNORMAL HIGH (ref 70–99)
Glucose-Capillary: 135 mg/dL — ABNORMAL HIGH (ref 70–99)

## 2021-10-26 LAB — CBC
HCT: 32.3 % — ABNORMAL LOW (ref 39.0–52.0)
Hemoglobin: 9.5 g/dL — ABNORMAL LOW (ref 13.0–17.0)
MCH: 26.2 pg (ref 26.0–34.0)
MCHC: 29.4 g/dL — ABNORMAL LOW (ref 30.0–36.0)
MCV: 89 fL (ref 80.0–100.0)
Platelets: 236 10*3/uL (ref 150–400)
RBC: 3.63 MIL/uL — ABNORMAL LOW (ref 4.22–5.81)
RDW: 15.9 % — ABNORMAL HIGH (ref 11.5–15.5)
WBC: 8.8 10*3/uL (ref 4.0–10.5)
nRBC: 0 % (ref 0.0–0.2)

## 2021-10-26 NOTE — ED Triage Notes (Signed)
Pt arrives via ems for hypoglycemia, ems got sugar of 48 on arrival, gave 261ml of D10 and two tabs of oral glucose, fsbs on arrival 149. Pt alert to voice, unsure of place.

## 2021-10-27 LAB — CBG MONITORING, ED: Glucose-Capillary: 168 mg/dL — ABNORMAL HIGH (ref 70–99)

## 2021-10-27 NOTE — ED Provider Notes (Signed)
[facility] Provider Note    Event Date/Time   First MD Initiated Contact with Patient 10/26/21 2234     (approximate)  History   Chief Complaint: Hypoglycemia  HPI  [name] [name] is a 46 y.o. male with a past medical history of CHF, ESRD on HD Monday/Wednesday/Friday, diabetes, presents to the emergency department for altered mental status.  According to EMS they were called to the patient's home for decreased responsiveness.  They found the patient have a blood glucose around 40.  They gave the patient D10 as well as oral glucose.  Upon arrival patient's fingerstick is 149.  Patient is awake alert answering questions appropriately during my evaluation.  Physical Exam   Triage Vital Signs: ED Triage Vitals  Enc Vitals Group     BP 10/26/21 2227 (!) 163/81     Pulse Rate 10/26/21 2227 76     Resp 10/26/21 2227 18     Temp 10/26/21 2228 (!) 97.4 F (36.3 C)     Temp Source 10/26/21 2227 Oral     SpO2 10/26/21 2227 100 %     Weight 10/26/21 2228 286 lb 9.6 oz (130 kg)     Height 10/26/21 2228 6' (1.829 m)     Head Circumference --      Peak Flow --      Pain Score 10/26/21 2227 0     Pain Loc --      Pain Edu? --      Excl. in [place]? --     Most recent vital signs: Vitals:   10/26/21 2228 10/26/21 2300  BP:  (!) 154/80  Pulse:  76  Resp:  (!) 25  Temp: (!) 97.4 F (36.3 C)   SpO2:  98%    General: Awake, no distress.  CV:  Good peripheral perfusion.  Regular rate and rhythm  Resp:  Normal effort.  Equal breath sounds bilaterally.  Patient has a right subclavian HD line Abd:  No distention.  Soft, nontender.  No rebound or guarding.   ED Results / Procedures / Treatments   MEDICATIONS ORDERED IN ED: Medications - No data to display   IMPRESSION / MDM / [place] / ED COURSE  I reviewed the triage vital signs and the nursing notes.  Patient's presentation is most consistent with acute presentation with potential threat to  life or bodily function.  Patient presents emergency department for altered mental status.  Patient found to be hypoglycemic.  Patient responded well to glucose upon arrival patient is awake alert answering questions appropriately asking for something to eat such as a sandwich.  Patient has eaten and continues to appear well.  His lab work shows unchanged CBC, reassuring chemistry compared to his baseline.  Blood sugar remains elevated at 168 on recheck.  Patient feels well and states he is ready to go home.  Family is here with the patient.  Patient states he took his insulin tonight and got distracted talking on the phone he is not sure if he ate a meal or not.  Could be the cause of the patient's hypoglycemic episode.  We will have the patient follow-up with his doctor.  I discussed return precautions.  As a secondary complaint patient states he has been having some difficulty swallowing has been told in the past that he had esophageal narrowing.  We will refer to GI medicine.  Patient has no acute GI issue here tonight.  FINAL CLINICAL IMPRESSION(S) / ED  DIAGNOSES   Hypoglycemia    Note:  This document was prepared using Dragon voice recognition software and may include unintentional dictation errors.   Harvest Dark, MD 10/27/21 [phone]

## 2021-10-27 NOTE — Discharge Instructions (Addendum)
Please monitor your blood sugar very closely at home.  Make sure to eat small meals throughout the day to maintain your blood glucose.  Return to the emergency department for any further episodes of low blood sugar readings, or any concerning findings by your family.  Please call the number provided for GI medicine to arrange a follow-up appointment to discuss your swallowing issues in further detail.  Return to the emergency department for any symptom personally concerning to yourself.

## 2021-10-30 LAB — CBG MONITORING, ED: Glucose-Capillary: 149 mg/dL — ABNORMAL HIGH (ref 70–99)

## 2021-11-26 ENCOUNTER — Other Ambulatory Visit: Payer: Self-pay | Admitting: Gastroenterology

## 2021-11-26 DIAGNOSIS — R1314 Dysphagia, pharyngoesophageal phase: Secondary | ICD-10-CM

## 2021-12-26 ENCOUNTER — Telehealth (INDEPENDENT_AMBULATORY_CARE_PROVIDER_SITE_OTHER): Payer: Self-pay

## 2021-12-26 NOTE — Telephone Encounter (Signed)
I attempted to contact the patient to schedule him for a permcath removal with Dr. Lucky Cowboy. A message was left for a return call.

## 2021-12-27 ENCOUNTER — Other Ambulatory Visit (HOSPITAL_COMMUNITY): Payer: Self-pay | Admitting: Nurse Practitioner

## 2021-12-27 ENCOUNTER — Other Ambulatory Visit: Payer: Self-pay | Admitting: Nurse Practitioner

## 2021-12-27 DIAGNOSIS — M479 Spondylosis, unspecified: Secondary | ICD-10-CM

## 2021-12-27 DIAGNOSIS — G8929 Other chronic pain: Secondary | ICD-10-CM

## 2022-01-01 ENCOUNTER — Telehealth (INDEPENDENT_AMBULATORY_CARE_PROVIDER_SITE_OTHER): Payer: Self-pay

## 2022-01-01 NOTE — Telephone Encounter (Signed)
I attempted to contact the patient to schedule a permcath removal with Dr. Lucky Cowboy. A message was left for a return call.

## 2022-01-05 ENCOUNTER — Emergency Department: Payer: Medicare Other

## 2022-01-05 ENCOUNTER — Encounter: Payer: Self-pay | Admitting: Emergency Medicine

## 2022-01-05 ENCOUNTER — Other Ambulatory Visit: Payer: Self-pay

## 2022-01-05 ENCOUNTER — Inpatient Hospital Stay
Admission: EM | Admit: 2022-01-05 | Discharge: 2022-01-26 | DRG: 853 | Disposition: E | Payer: Medicare Other | Attending: Internal Medicine | Admitting: Internal Medicine

## 2022-01-05 DIAGNOSIS — Z794 Long term (current) use of insulin: Secondary | ICD-10-CM

## 2022-01-05 DIAGNOSIS — N2581 Secondary hyperparathyroidism of renal origin: Secondary | ICD-10-CM | POA: Diagnosis present

## 2022-01-05 DIAGNOSIS — I2510 Atherosclerotic heart disease of native coronary artery without angina pectoris: Secondary | ICD-10-CM | POA: Diagnosis present

## 2022-01-05 DIAGNOSIS — I132 Hypertensive heart and chronic kidney disease with heart failure and with stage 5 chronic kidney disease, or end stage renal disease: Secondary | ICD-10-CM | POA: Diagnosis present

## 2022-01-05 DIAGNOSIS — Z884 Allergy status to anesthetic agent status: Secondary | ICD-10-CM

## 2022-01-05 DIAGNOSIS — R509 Fever, unspecified: Principal | ICD-10-CM

## 2022-01-05 DIAGNOSIS — G9341 Metabolic encephalopathy: Secondary | ICD-10-CM | POA: Diagnosis not present

## 2022-01-05 DIAGNOSIS — E1142 Type 2 diabetes mellitus with diabetic polyneuropathy: Secondary | ICD-10-CM | POA: Diagnosis present

## 2022-01-05 DIAGNOSIS — Z20822 Contact with and (suspected) exposure to covid-19: Secondary | ICD-10-CM | POA: Diagnosis present

## 2022-01-05 DIAGNOSIS — E669 Obesity, unspecified: Secondary | ICD-10-CM | POA: Diagnosis present

## 2022-01-05 DIAGNOSIS — J9602 Acute respiratory failure with hypercapnia: Secondary | ICD-10-CM | POA: Diagnosis not present

## 2022-01-05 DIAGNOSIS — J9601 Acute respiratory failure with hypoxia: Secondary | ICD-10-CM | POA: Diagnosis not present

## 2022-01-05 DIAGNOSIS — E1165 Type 2 diabetes mellitus with hyperglycemia: Secondary | ICD-10-CM | POA: Diagnosis present

## 2022-01-05 DIAGNOSIS — Z91041 Radiographic dye allergy status: Secondary | ICD-10-CM

## 2022-01-05 DIAGNOSIS — Z7982 Long term (current) use of aspirin: Secondary | ICD-10-CM

## 2022-01-05 DIAGNOSIS — I10 Essential (primary) hypertension: Secondary | ICD-10-CM | POA: Diagnosis not present

## 2022-01-05 DIAGNOSIS — A419 Sepsis, unspecified organism: Secondary | ICD-10-CM | POA: Diagnosis not present

## 2022-01-05 DIAGNOSIS — E861 Hypovolemia: Secondary | ICD-10-CM | POA: Diagnosis not present

## 2022-01-05 DIAGNOSIS — R001 Bradycardia, unspecified: Secondary | ICD-10-CM | POA: Diagnosis not present

## 2022-01-05 DIAGNOSIS — E162 Hypoglycemia, unspecified: Secondary | ICD-10-CM | POA: Diagnosis not present

## 2022-01-05 DIAGNOSIS — N186 End stage renal disease: Secondary | ICD-10-CM | POA: Diagnosis present

## 2022-01-05 DIAGNOSIS — J930 Spontaneous tension pneumothorax: Secondary | ICD-10-CM | POA: Diagnosis not present

## 2022-01-05 DIAGNOSIS — D72829 Elevated white blood cell count, unspecified: Secondary | ICD-10-CM

## 2022-01-05 DIAGNOSIS — K219 Gastro-esophageal reflux disease without esophagitis: Secondary | ICD-10-CM | POA: Diagnosis present

## 2022-01-05 DIAGNOSIS — Z79899 Other long term (current) drug therapy: Secondary | ICD-10-CM | POA: Diagnosis not present

## 2022-01-05 DIAGNOSIS — M009 Pyogenic arthritis, unspecified: Secondary | ICD-10-CM | POA: Diagnosis present

## 2022-01-05 DIAGNOSIS — D649 Anemia, unspecified: Secondary | ICD-10-CM | POA: Diagnosis present

## 2022-01-05 DIAGNOSIS — Z888 Allergy status to other drugs, medicaments and biological substances status: Secondary | ICD-10-CM

## 2022-01-05 DIAGNOSIS — E11319 Type 2 diabetes mellitus with unspecified diabetic retinopathy without macular edema: Secondary | ICD-10-CM | POA: Diagnosis present

## 2022-01-05 DIAGNOSIS — R579 Shock, unspecified: Secondary | ICD-10-CM | POA: Diagnosis not present

## 2022-01-05 DIAGNOSIS — L870 Keratosis follicularis et parafollicularis in cutem penetrans: Secondary | ICD-10-CM | POA: Diagnosis present

## 2022-01-05 DIAGNOSIS — E1122 Type 2 diabetes mellitus with diabetic chronic kidney disease: Secondary | ICD-10-CM | POA: Diagnosis present

## 2022-01-05 DIAGNOSIS — I454 Nonspecific intraventricular block: Secondary | ICD-10-CM | POA: Diagnosis present

## 2022-01-05 DIAGNOSIS — E871 Hypo-osmolality and hyponatremia: Secondary | ICD-10-CM | POA: Diagnosis present

## 2022-01-05 DIAGNOSIS — A411 Sepsis due to other specified staphylococcus: Principal | ICD-10-CM | POA: Diagnosis present

## 2022-01-05 DIAGNOSIS — R1030 Lower abdominal pain, unspecified: Secondary | ICD-10-CM | POA: Diagnosis present

## 2022-01-05 DIAGNOSIS — M25451 Effusion, right hip: Secondary | ICD-10-CM | POA: Diagnosis present

## 2022-01-05 DIAGNOSIS — I462 Cardiac arrest due to underlying cardiac condition: Secondary | ICD-10-CM | POA: Diagnosis not present

## 2022-01-05 DIAGNOSIS — I120 Hypertensive chronic kidney disease with stage 5 chronic kidney disease or end stage renal disease: Secondary | ICD-10-CM | POA: Diagnosis not present

## 2022-01-05 DIAGNOSIS — B957 Other staphylococcus as the cause of diseases classified elsewhere: Secondary | ICD-10-CM | POA: Diagnosis not present

## 2022-01-05 DIAGNOSIS — Z992 Dependence on renal dialysis: Secondary | ICD-10-CM | POA: Diagnosis not present

## 2022-01-05 DIAGNOSIS — D631 Anemia in chronic kidney disease: Secondary | ICD-10-CM | POA: Diagnosis present

## 2022-01-05 DIAGNOSIS — R7881 Bacteremia: Secondary | ICD-10-CM | POA: Diagnosis present

## 2022-01-05 DIAGNOSIS — R6520 Severe sepsis without septic shock: Secondary | ICD-10-CM | POA: Diagnosis present

## 2022-01-05 DIAGNOSIS — M00051 Staphylococcal arthritis, right hip: Secondary | ICD-10-CM | POA: Diagnosis not present

## 2022-01-05 DIAGNOSIS — Z833 Family history of diabetes mellitus: Secondary | ICD-10-CM

## 2022-01-05 LAB — COMPREHENSIVE METABOLIC PANEL
ALT: 6 U/L (ref 0–44)
AST: 13 U/L — ABNORMAL LOW (ref 15–41)
Albumin: 3.2 g/dL — ABNORMAL LOW (ref 3.5–5.0)
Alkaline Phosphatase: 73 U/L (ref 38–126)
Anion gap: 16 — ABNORMAL HIGH (ref 5–15)
BUN: 24 mg/dL — ABNORMAL HIGH (ref 6–20)
CO2: 20 mmol/L — ABNORMAL LOW (ref 22–32)
Calcium: 9.1 mg/dL (ref 8.9–10.3)
Chloride: 94 mmol/L — ABNORMAL LOW (ref 98–111)
Creatinine, Ser: 10.26 mg/dL — ABNORMAL HIGH (ref 0.61–1.24)
GFR, Estimated: 6 mL/min — ABNORMAL LOW (ref >60)
Glucose, Bld: 201 mg/dL — ABNORMAL HIGH (ref 70–99)
Potassium: 3.7 mmol/L (ref 3.5–5.1)
Sodium: 130 mmol/L — ABNORMAL LOW (ref 135–145)
Total Bilirubin: 0.9 mg/dL (ref 0.3–1.2)
Total Protein: 8.6 g/dL — ABNORMAL HIGH (ref 6.5–8.1)

## 2022-01-05 LAB — CBC WITH DIFFERENTIAL/PLATELET
Abs Immature Granulocytes: 0.08 10*3/uL — ABNORMAL HIGH (ref 0.00–0.07)
Basophils Absolute: 0.1 10*3/uL (ref 0.0–0.1)
Basophils Relative: 1 %
Eosinophils Absolute: 0.2 10*3/uL (ref 0.0–0.5)
Eosinophils Relative: 2 %
HCT: 29.8 % — ABNORMAL LOW (ref 39.0–52.0)
Hemoglobin: 8.4 g/dL — ABNORMAL LOW (ref 13.0–17.0)
Immature Granulocytes: 1 %
Lymphocytes Relative: 4 %
Lymphs Abs: 0.5 10*3/uL — ABNORMAL LOW (ref 0.7–4.0)
MCH: 25 pg — ABNORMAL LOW (ref 26.0–34.0)
MCHC: 28.2 g/dL — ABNORMAL LOW (ref 30.0–36.0)
MCV: 88.7 fL (ref 80.0–100.0)
Monocytes Absolute: 0.8 10*3/uL (ref 0.1–1.0)
Monocytes Relative: 5 %
Neutro Abs: 12.7 10*3/uL — ABNORMAL HIGH (ref 1.7–7.7)
Neutrophils Relative %: 87 %
Platelets: 320 10*3/uL (ref 150–400)
RBC: 3.36 MIL/uL — ABNORMAL LOW (ref 4.22–5.81)
RDW: 17.2 % — ABNORMAL HIGH (ref 11.5–15.5)
WBC: 14.4 10*3/uL — ABNORMAL HIGH (ref 4.0–10.5)
nRBC: 0 % (ref 0.0–0.2)

## 2022-01-05 LAB — PROTIME-INR
INR: 1.2 (ref 0.8–1.2)
Prothrombin Time: 14.8 seconds (ref 11.4–15.2)

## 2022-01-05 LAB — GLUCOSE, CAPILLARY: Glucose-Capillary: 199 mg/dL — ABNORMAL HIGH (ref 70–99)

## 2022-01-05 LAB — SARS CORONAVIRUS 2 BY RT PCR: SARS Coronavirus 2 by RT PCR: NEGATIVE

## 2022-01-05 LAB — LACTIC ACID, PLASMA: Lactic Acid, Venous: 1.3 mmol/L (ref 0.5–1.9)

## 2022-01-05 MED ORDER — VANCOMYCIN HCL 1250 MG/250ML IV SOLN
1250.0000 mg | Freq: Once | INTRAVENOUS | Status: AC
Start: 2022-01-06 — End: 2022-01-06
  Administered 2022-01-06: 1250 mg via INTRAVENOUS
  Filled 2022-01-05: qty 250

## 2022-01-05 MED ORDER — ASPIRIN 81 MG PO TBEC
81.0000 mg | DELAYED_RELEASE_TABLET | Freq: Every day | ORAL | Status: DC
Start: 2022-01-06 — End: 2022-01-06

## 2022-01-05 MED ORDER — VANCOMYCIN HCL IN DEXTROSE 1-5 GM/200ML-% IV SOLN: 1000.0000 mg | Freq: Once | INTRAVENOUS | Status: DC

## 2022-01-05 MED ORDER — CINACALCET HCL 30 MG PO TABS
30.0000 mg | ORAL_TABLET | Freq: Every day | ORAL | Status: DC
  Administered 2022-01-07 – 2022-01-08 (×2): 30 mg via ORAL
  Filled 2022-01-05 (×4): qty 1

## 2022-01-05 MED ORDER — PANTOPRAZOLE SODIUM 40 MG PO TBEC
40.0000 mg | DELAYED_RELEASE_TABLET | Freq: Every day | ORAL | Status: DC
  Administered 2022-01-07 – 2022-01-08 (×2): 40 mg via ORAL
  Filled 2022-01-05 (×2): qty 1

## 2022-01-05 MED ORDER — INSULIN ASPART PROT & ASPART (70-30 MIX) 100 UNIT/ML ~~LOC~~ SUSP
15.0000 [IU] | Freq: Two times a day (BID) | SUBCUTANEOUS | Status: DC
  Administered 2022-01-07 – 2022-01-08 (×3): 15 [IU] via SUBCUTANEOUS
  Filled 2022-01-05: qty 10

## 2022-01-05 MED ORDER — CARVEDILOL 25 MG PO TABS
25.0000 mg | ORAL_TABLET | Freq: Two times a day (BID) | ORAL | Status: DC
  Administered 2022-01-05 – 2022-01-08 (×6): 25 mg via ORAL
  Filled 2022-01-05 (×6): qty 1

## 2022-01-05 MED ORDER — INSULIN ASPART 100 UNIT/ML IJ SOLN
0.0000 [IU] | Freq: Three times a day (TID) | INTRAMUSCULAR | Status: DC
  Administered 2022-01-06 – 2022-01-07 (×2): 3 [IU] via SUBCUTANEOUS
  Administered 2022-01-07: 5 [IU] via SUBCUTANEOUS
  Administered 2022-01-07: 8 [IU] via SUBCUTANEOUS
  Administered 2022-01-08 (×2): 3 [IU] via SUBCUTANEOUS
  Filled 2022-01-05 (×7): qty 1

## 2022-01-05 MED ORDER — VITAMIN E 180 MG (400 UNIT) PO CAPS
800.0000 [IU] | ORAL_CAPSULE | Freq: Every day | ORAL | Status: DC
  Administered 2022-01-07 – 2022-01-08 (×2): 800 [IU] via ORAL
  Filled 2022-01-05 (×4): qty 2

## 2022-01-05 MED ORDER — CYCLOBENZAPRINE HCL 5 MG PO TABS
5.0000 mg | ORAL_TABLET | Freq: Every evening | ORAL | Status: DC | PRN
Start: 2022-01-05 — End: 2022-01-09
  Administered 2022-01-07: 5 mg via ORAL
  Filled 2022-01-05: qty 1

## 2022-01-05 MED ORDER — VITAMIN A 3 MG (10000 UNIT) PO CAPS
20000.0000 [IU] | ORAL_CAPSULE | Freq: Every day | ORAL | Status: DC
  Administered 2022-01-07 – 2022-01-08 (×2): 20000 [IU] via ORAL
  Filled 2022-01-05 (×4): qty 2

## 2022-01-05 MED ORDER — VANCOMYCIN HCL IN DEXTROSE 1-5 GM/200ML-% IV SOLN
1000.0000 mg | INTRAVENOUS | Status: DC
  Administered 2022-01-06 – 2022-01-08 (×2): 1000 mg via INTRAVENOUS
  Filled 2022-01-05 (×2): qty 200

## 2022-01-05 MED ORDER — SODIUM CHLORIDE 0.9 % IV SOLN
1.0000 g | INTRAVENOUS | Status: DC
  Administered 2022-01-06 – 2022-01-07 (×2): 1 g via INTRAVENOUS
  Filled 2022-01-05 (×2): qty 1

## 2022-01-05 MED ORDER — LIDOCAINE HCL 4 % EX SOLN
1.0000 mL | CUTANEOUS | Status: DC | PRN
Start: 2022-01-05 — End: 2022-01-09

## 2022-01-05 MED ORDER — SODIUM CHLORIDE 0.9 % IV SOLN
2.0000 g | Freq: Once | INTRAVENOUS | Status: AC
  Administered 2022-01-06: 2 g via INTRAVENOUS
  Filled 2022-01-05: qty 12.5

## 2022-01-05 MED ORDER — SEVELAMER CARBONATE 2.4 G PO PACK
2.4000 g | PACK | Freq: Three times a day (TID) | ORAL | Status: DC
  Administered 2022-01-07 – 2022-01-08 (×4): 2.4 g via ORAL
  Filled 2022-01-05 (×11): qty 1

## 2022-01-05 NOTE — ED Provider Notes (Signed)
[facility] Provider Note    Event Date/Time   First MD Initiated Contact with Patient 01/08/2022 1803     (approximate)   History   Groin Pain   HPI  [name] [name] is a 46 y.o. male who comes in complaining of severe pain in the right hip and groin.  It is worse with movement.  Is been going on for several days.  He also has a fever here.  He did not realize he had 1.  He is not coughing except for once in a while.  He has no sore throat or chest pain or belly pain or diarrhea or any other complaints.  He does not make any urine. He is scheduled to have surgery on a cyst as I understand it in his throat.     Physical Exam   Triage Vital Signs: ED Triage Vitals  Enc Vitals Group     BP 12/31/2021 1753 (!) 151/85     Pulse Rate 01/04/2022 1753 93     Resp 01/24/2022 1753 16     Temp 01/15/2022 1753 (!) 101 F (38.3 C)     Temp Source 01/01/2022 1753 Oral     SpO2 01/04/2022 1753 92 %     Weight --      Height --      Head Circumference --      Peak Flow --      Pain Score 01/23/2022 1754 10     Pain Loc --      Pain Edu? --      Excl. in [place]? --     Most recent vital signs: Vitals:   01/17/2022 2218 01/15/2022 2314  BP: (!) 162/91 (!) 157/86  Pulse: 83 86  Resp: 16 17  Temp: 98.9 F (37.2 C) 98.2 F (36.8 C)  SpO2: 95% 97%     General: Awake, no distress.  Mouth no erythema or exudate Neck is supple CV:  Good peripheral perfusion.  Regular rate and rhythm no audible murmurs Resp:  Normal effort.  Lungs are clear Abd:  No distention.  Soft and nontender Back nontender to palpation percussion Extremities: No marked edema no rash he is tender with movement of the right hip.   ED Results / Procedures / Treatments   Labs (all labs ordered are listed, but only abnormal results are displayed) Labs Reviewed  COMPREHENSIVE METABOLIC PANEL - Abnormal; Notable for the following components:      Result Value   Sodium 130 (*)    Chloride 94 (*)     CO2 20 (*)    Glucose, Bld 201 (*)    BUN 24 (*)    Creatinine, Ser 10.26 (*)    Total Protein 8.6 (*)    Albumin 3.2 (*)    AST 13 (*)    GFR, Estimated 6 (*)    Anion gap 16 (*)    All other components within normal limits  CBC WITH DIFFERENTIAL/PLATELET - Abnormal; Notable for the following components:   WBC 14.4 (*)    RBC 3.36 (*)    Hemoglobin 8.4 (*)    HCT 29.8 (*)    MCH 25.0 (*)    MCHC 28.2 (*)    RDW 17.2 (*)    Neutro Abs 12.7 (*)    Lymphs Abs 0.5 (*)    Abs Immature Granulocytes 0.08 (*)    All other components within normal limits  GLUCOSE, CAPILLARY - Abnormal; Notable for the following  components:   Glucose-Capillary 199 (*)    All other components within normal limits  SARS CORONAVIRUS 2 BY RT PCR  CULTURE, BLOOD (ROUTINE X 2)  CULTURE, BLOOD (ROUTINE X 2)  LACTIC ACID, PLASMA  PROTIME-INR  URINALYSIS, ROUTINE W REFLEX MICROSCOPIC  PROTIME-INR  CORTISOL-AM, BLOOD  PROCALCITONIN  BASIC METABOLIC PANEL  CBC  HIV ANTIBODY (ROUTINE TESTING W REFLEX)  SEDIMENTATION RATE  HEMOGLOBIN A1C     EKG    RADIOLOGY  Chest x-ray read by radiology reviewed and interpreted by me does not show any acute pathology MRI right hip read by radiology reviewed and interpreted by me shows a small effusion.  This is the only focal finding of been able to develop on this gentleman.  I discussed this in detail with Dr. Arnette Schaumann who is on-call for orthopedics.  We will plan on aspirating hip under IR tomorrow.  Dr. Trixie Rude has been made aware of this.  He is considering what antibiotics to give the patient  PROCEDURES:  Critical Care performed: Critical care time 20 minutes.  This includes reviewing the patient's studies talking to him and his family and discussing him with orthopedics and the hospitalist.  Additionally I went over him quite carefully.  Again the only focal finding I been able to find is the pain in his hip.  He does not make any  urine.  Procedures   MEDICATIONS ORDERED IN ED: Medications  carvedilol (COREG) tablet 25 mg (25 mg Oral Given 12/29/2021 2350)  cinacalcet (SENSIPAR) tablet 30 mg (has no administration in time range)  insulin aspart protamine- aspart (NOVOLOG MIX 70/30) injection 15 Units (has no administration in time range)  pantoprazole (PROTONIX) EC tablet 40 mg (has no administration in time range)  sevelamer carbonate (RENVELA) powder PACK 2.4 g (has no administration in time range)  cyclobenzaprine (FLEXERIL) tablet 5 mg (has no administration in time range)  vitamin A capsule 20,000 Units (has no administration in time range)  Vitamin E CAPS 800 Units (has no administration in time range)  lidocaine (XYLOCAINE) 4 % external solution 1 mL (has no administration in time range)  vancomycin (VANCOREADY) IVPB 1250 mg/250 mL (1,250 mg Intravenous New Bag/Given 01/19/2022 0103)    Followed by  vancomycin (VANCOREADY) IVPB 1250 mg/250 mL (has no administration in time range)  insulin aspart (novoLOG) injection 0-15 Units (has no administration in time range)  ceFEPIme (MAXIPIME) 1 g in sodium chloride 0.9 % 100 mL IVPB (has no administration in time range)  vancomycin (VANCOCIN) IVPB 1000 mg/200 mL premix (has no administration in time range)  morphine (PF) 2 MG/ML injection 2 mg (2 mg Intravenous Given 01/01/2022 0013)  ceFEPIme (MAXIPIME) 2 g in sodium chloride 0.9 % 100 mL IVPB (2 g Intravenous New Bag/Given 12/30/2021 0009)     IMPRESSION / MDM / ASSESSMENT AND PLAN / ED COURSE  I reviewed the triage vital signs and the nursing notes.   Differential diagnosis includes, but is not limited to, infection likely bacterial in view of the differential.  It is possible that the patient has some other source of his infection but it seems most likely COVID is related to his hip in someway.  Patient's presentation is most consistent with acute presentation with potential threat to life or bodily function.  The  patient is on the cardiac monitor to evaluate for evidence of arrhythmia and/or significant heart rate changes.  None were seen. {     FINAL CLINICAL IMPRESSION(S) / ED DIAGNOSES  Final diagnoses:  Fever, unspecified  Leukocytosis, unspecified type  Hip effusion, right     Rx / DC Orders   ED Discharge Orders     None        Note:  This document was prepared using Dragon voice recognition software and may include unintentional dictation errors.   Nena Polio, MD 01/16/2022 [phone]

## 2022-01-05 NOTE — ED Triage Notes (Signed)
Pt to ED via POV for pain in his right groin for "a while". Pt states that he has seen his doctor and was given muscle relaxer's but it they have not helped.

## 2022-01-05 NOTE — H&P (Signed)
[place]   PATIENT NAME: [name] [name]    MR#:  [id number]  DATE OF BIRTH:  October 14, 1975  DATE OF ADMISSION:  01/03/2022  PRIMARY CARE PHYSICIAN: Danelle Berry, NP   Patient is coming from: Home  REQUESTING/REFERRING PHYSICIAN: Conni Slipper, MD  CHIEF COMPLAINT:   Chief Complaint  Patient presents with   Groin Pain    HISTORY OF PRESENT ILLNESS:  [name] [name] is a 46 y.o. African-American male with medical history significant for CHF coronary artery disease, type diabetes mellitus, end-stage renal disease on hemodialysis on Monday Wednesday Friday, GERD, hypertension and type 2 diabetes mellitus, presented to the emergency room with a [place] of right hip pain with difficulty with ambulation.  The pain has been significantly worse with movement in the hip and in the groin.  He has been having occasional cough.  No sore throat or rhinorrhea or nasal congestion.  No nausea or vomiting or diarrhea or abdominal pain.  He does not make urine.  ED Course: When he came to the ER temperature was 101 and blood pressure 131/85 with pulse symmetry of 92% on room air and later 99.  CMP revealed a sodium of 130 with chloride of 94 and CO2 20 with glucose of 201, BUN of 24 with a creatinine of 10.26 with anion gap of 16.  Albumin was 3.2 with total protein of 8.6 and CBC showed leukocytosis of 14.4 with neutrophilia and anemia slightly worse than previous levels.  COVID-19 PCR came back negative.  Imaging: Two-view chest x-ray showed no acute cardiopulmonary disease. Right hip MRI revealed hip effusion.  The ER physician.  And Dr.Poggi was notified about the patient and recommended IV antibiotics.  The patient will be admitted to a medical bed for further evaluation and management. 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   CHF (congestive heart failure) (HCC)    Coronary artery disease    Diabetes mellitus without  complication ([place])    Dialysis patient [facility])    M-W-F   GERD (gastroesophageal reflux disease)    Hypertension    Kyrle's disease    Renal disorder    on dialysis for 4 years three times a week last was Tuesday   Secondary hyperparathyroidism of renal origin ([place])    Shortness of breath dyspnea    Type 2 diabetes mellitus with diabetic nephropathy (HCC)    Type 2 diabetes mellitus with diabetic retinopathy ([place])     PAST SURGICAL HISTORY:   Past Surgical History:  Procedure Laterality Date   A/V FISTULAGRAM Left 10/01/2017   Procedure: A/V FISTULAGRAM;  Surgeon: Algernon Huxley, MD;  Location: [place] [facility];  Service: Cardiovascular;  Laterality: Left;   A/V FISTULAGRAM Left 05/17/2020   Procedure: A/V FISTULAGRAM;  Surgeon: Algernon Huxley, MD;  Location: [place] [facility];  Service: Cardiovascular;  Laterality: Left;   A/V SHUNTOGRAM Left 04/16/2021   Procedure: A/V SHUNTOGRAM;  Surgeon: Katha Cabal, MD;  Location: [place] [facility];  Service: Cardiovascular;  Laterality: Left;   A/V SHUNTOGRAM Left 08/29/2021   Procedure: A/V SHUNTOGRAM;  Surgeon: Algernon Huxley, MD;  Location: [place] [facility];  Service: Cardiovascular;  Laterality: Left;   CATARACT EXTRACTION W/PHACO Right 06/30/2018   Procedure: CATARACT EXTRACTION PHACO AND INTRAOCULAR LENS PLACEMENT (IOC)-RIGHT;  Surgeon: Eulogio Bear, MD;  Location: [facility] ORS;  Service: Ophthalmology;  Laterality: Right;  [place]  00:07.0 CDE 0.16 FLUID PACK lOT # [id number]   CATARACT EXTRACTION W/PHACO Left 12/24/2018   Procedure: CATARACT EXTRACTION PHACO AND INTRAOCULAR LENS PLACEMENT (IOC);  Surgeon: Eulogio Bear, MD;  Location: [facility]S;  Service: Ophthalmology;  Laterality: Left;  [place] 00:30.0 CDE 2.01 FLUID PACK LOT # [id number]   COLONOSCOPY WITH PROPOFOL N/A 04/23/2017   Procedure: COLONOSCOPY WITH PROPOFOL;  Surgeon: [name], MD;  Location: [facility] END[facility]COPY;  Service: Endoscopy;  Laterality: N/A;   COLONOSCOPY  WITH PROPOFOL N/A 07/02/2017   Procedure: COLONOSCOPY WITH PROPOFOL;  Surgeon: [name], MD;  Location: [facility] ENDOSCOPY;  Service: Endoscopy;  Laterality: N/A;   DIALYSIS/PERMA CATHETER INSERTION N/A 09/27/2020   Procedure: DIALYSIS/PERMA CATHETER INSERTION;  Surgeon: Algernon Huxley, MD;  Location: [place] [facility];  Service: Cardiovascular;  Laterality: N/A;   DIALYSIS/PERMA CATHETER INSERTION N/A 09/19/2021   Procedure: DIALYSIS/PERMA CATHETER INSERTION;  Surgeon: Algernon Huxley, MD;  Location: [place] [facility];  Service: Cardiovascular;  Laterality: N/A;   DIALYSIS/PERMA CATHETER REMOVAL N/A 11/01/2020   Procedure: DIALYSIS/PERMA CATHETER REMOVAL;  Surgeon: Algernon Huxley, MD;  Location: [place] [facility];  Service: Cardiovascular;  Laterality: N/A;   ESOPHAGOGASTRODUODENOSCOPY (EGD) WITH PROPOFOL N/A 04/23/2017   Procedure: ESOPHAGOGASTRODUODENOSCOPY (EGD) WITH PROPOFOL;  Surgeon: [name], MD;  Location: [facility] ENDOSCOPY;  Service: Endoscopy;  Laterality: N/A;   ESOPHAGOGASTRODUODENOSCOPY (EGD) WITH PROPOFOL N/A 03/27/2020   Procedure: ESOPHAGOGASTRODUODENOSCOPY (EGD) WITH PROPOFOL;  Surgeon: Lesly Rubenstein, MD;  Location: [facility] ENDOSCOPY;  Service: Endoscopy;  Laterality: N/A;   EYE SURGERY     RETINA   INSERTION OF DIALYSIS CATHETER Right 08/09/2020   Procedure: INSERTION OF DIALYSIS CATHETER;  Surgeon: Algernon Huxley, MD;  Location: [facility] ORS;  Service: Vascular;  Laterality: Right;   LEFT HEART CATH AND CORONARY ANGIOGRAPHY N/A 08/27/2021   Procedure: LEFT HEART CATH AND CORONARY ANGIOGRAPHY;  Surgeon: Dionisio David, MD;  Location: [place] [facility];  Service: Cardiovascular;  Laterality: N/A;   PERIPHERAL VASCULAR CATHETERIZATION N/A 05/07/2015   Procedure: A/V Shuntogram/Fistulagram;  Surgeon: Katha Cabal, MD;  Location: [place] [facility];  Service: Cardiovascular;  Laterality: N/A;   PERIPHERAL VASCULAR CATHETERIZATION Left 05/07/2015   Procedure: A/V Shunt  Intervention;  Surgeon: Katha Cabal, MD;  Location: [place] [facility];  Service: Cardiovascular;  Laterality: Left;   PERIPHERAL VASCULAR THROMBECTOMY Left 07/16/2020   Procedure: PERIPHERAL VASCULAR THROMBECTOMY;  Surgeon: Algernon Huxley, MD;  Location: [place] [facility];  Service: Cardiovascular;  Laterality: Left;   PERIPHERAL VASCULAR THROMBECTOMY Left 08/27/2020   Procedure: PERIPHERAL VASCULAR THROMBECTOMY;  Surgeon: Algernon Huxley, MD;  Location: [place] [facility];  Service: Cardiovascular;  Laterality: Left;   PERIPHERAL VASCULAR THROMBECTOMY N/A 09/26/2021   Procedure: PERIPHERAL VASCULAR THROMBECTOMY;  Surgeon: Algernon Huxley, MD;  Location: [place] [facility];  Service: Cardiovascular;  Laterality: N/A;   REVISON OF ARTERIOVENOUS FISTULA Left 08/09/2020   Procedure: REVISON OF ARTERIOVENOUS FISTULA;  Surgeon: Algernon Huxley, MD;  Location: [facility] ORS;  Service: Vascular;  Laterality: Left;   TEE WITHOUT CARDIOVERSION N/A 07/05/2015   Procedure: TRANSESOPHAGEAL ECHOCARDIOGRAM (TEE);  Surgeon: Dionisio David, MD;  Location: [facility] ORS;  Service: Cardiovascular;  Laterality: N/A;   UPPER EXTREMITY ANGIOGRAPHY Left 06/24/2018   Procedure: UPPER EXTREMITY ANGIOGRAPHY;  Surgeon: Algernon Huxley, MD;  Location: [place] [facility];  Service: Cardiovascular;  Laterality: Left;   VASCULAR ACCESS DEVICE INSERTION Left 07/11/2021   Procedure:  INSERTION OF HERO VASCULAR ACCESS DEVICE;  Surgeon: Algernon Huxley, MD;  Location: [facility];  Service: Vascular;  Laterality: Left;   WOUND DEBRIDEMENT N/A 09/25/2020   Procedure: DEBRIDEMENT WOUND;  Surgeon: Katha Cabal, MD;  Location: [facility] ORS;  Service: Vascular;  Laterality: N/A;    SOCIAL HISTORY:   Social History   Tobacco Use   Smoking status: Never   Smokeless tobacco: Never  Substance Use Topics   Alcohol use: Not Currently    Comment: occ. shot    FAMILY HISTORY:   Family History  Problem Relation Age of Onset   Diabetes Father      DRUG ALLERGIES:   Allergies  Allergen Reactions   Cephalexin Nausea Only   Ivp Dye [Iodinated Contrast Media] Diarrhea   Lisinopril Other (See Comments)    Pt cannot recall reaction, cough   Other     REVIEW OF SYSTEMS:   ROS As per history of present illness. All pertinent systems were reviewed above. Constitutional, HEENT, cardiovascular, respiratory, GI, GU, musculoskeletal, neuro, psychiatric, endocrine, integumentary and hematologic systems were reviewed and are otherwise negative/unremarkable except for positive findings mentioned above in the HPI.   MEDICATIONS AT HOME:   Prior to Admission medications   Medication Sig Start Date End Date Taking? Authorizing Provider  aspirin EC 81 MG tablet Take 81 mg by mouth daily at 6 PM. 01/14/16  Yes [provider]  Beta Carotene (VITAMIN A) 25000 UNIT capsule Take 25,000 Units by mouth daily.   Yes [provider]  carvedilol (COREG) 25 MG tablet Take 25 mg by mouth 2 (two) times daily. 02/24/21  Yes [provider]  cinacalcet (SENSIPAR) 30 MG tablet Take 30 mg by mouth daily. 03/26/21  Yes [provider]  HUMULIN 70/30 (70-30) 100 UNIT/ML injection Inject 15-30 Units into the skin 2 (two) times daily with a meal. 04/19/15  Yes [provider]  losartan (COZAAR) 50 MG tablet Take 50 mg by mouth daily. 05/22/18  Yes [provider]  omeprazole (PRILOSEC) 40 MG capsule Take 40 mg by mouth daily. 04/09/21  Yes [provider]  sevelamer carbonate (RENVELA) 2.4 g PACK Take 2.4 g by mouth 3 (three) times daily with meals. 02/27/20  Yes [provider]  vitamin E 1000 UNIT capsule Take 1,000 Units by mouth daily. Not sure of dose; started 4/29   Yes [provider]  cyclobenzaprine (FLEXERIL) 5 MG tablet Take 5 mg by mouth at bedtime as needed. 11/12/21   [provider]  hydrOXYzine (ATARAX) 25 MG tablet Take 25 mg by mouth at bedtime. Patient not  taking: Reported on 01/01/2022 07/06/21   [provider]  Lidocaine HCl 4 % LIQD Apply 1 Application topically as needed. 08/05/21   [provider]  loperamide (IMODIUM) 2 MG capsule Take 2 mg by mouth as needed for diarrhea or loose stools (Take 1/2-1 tablet as meeded to prevent diarrhea. Can take up to 1 tablet every 4 hours. Do not exceed 4 tablets in a day.).    [provider]      VITAL SIGNS:  Blood pressure (!) 157/86, pulse 86, temperature 98.2 F (36.8 C), temperature source Oral, resp. rate 17, SpO2 97 %.  PHYSICAL EXAMINATION:  Physical Exam  GENERAL:  46 y.o.-year-old African-American male patient lying in the bed with no acute distress.  EYES: Pupils equal, round, reactive to light and accommodation. No scleral icterus. Extraocular muscles intact.  HEENT: Head atraumatic, normocephalic. Oropharynx and  nasopharynx clear.  NECK:  Supple, no jugular venous distention. No thyroid enlargement, no tenderness.  LUNGS: Normal breath sounds bilaterally, no wheezing, rales,rhonchi or crepitation. No use of accessory muscles of respiration.  CARDIOVASCULAR: Regular rate and rhythm, S1, S2 normal. No murmurs, rubs, or gallops.  ABDOMEN: Soft, nondistended, nontender. Bowel sounds present. No organomegaly or mass.  EXTREMITIES: No pedal edema, cyanosis, or clubbing. Musculoskeletal: Remarkably diminished range of motion of the right hip due to pain. NEUROLOGIC: Cranial nerves II through XII are intact. Muscle strength 5/5 in all extremities. Sensation intact. Gait not checked.  PSYCHIATRIC: The patient is alert and oriented x 3.  Normal affect and good eye contact. SKIN: No obvious rash, lesion, or ulcer.   LABORATORY PANEL:   CBC Recent Labs  Lab 01/15/2022 1821  WBC 14.4*  HGB 8.4*  HCT 29.8*  PLT 320   ------------------------------------------------------------------------------------------------------------------  Chemistries  Recent Labs  Lab  01/20/2022 1821  NA 130*  K 3.7  CL 94*  CO2 20*  GLUCOSE 201*  BUN 24*  CREATININE 10.26*  CALCIUM 9.1  AST 13*  ALT 6  ALKPHOS 73  BILITOT 0.9   ------------------------------------------------------------------------------------------------------------------  Cardiac Enzymes No results for input(s): "TROPONINI" in the last 168 hours. ------------------------------------------------------------------------------------------------------------------  RADIOLOGY:  DG Chest 2 View  Result Date: 12/27/2021 CLINICAL DATA:  Suspected sepsis. EXAM: CHEST - 2 VIEW COMPARISON:  Chest x-ray 10/26/2021 FINDINGS: Right-sided central venous catheter tip projects over the distal SVC. Left-sided central venous catheter tip projects over the cavoatrial junction. There is a vascular stent in the left axillary region. The lungs are clear. There is no pleural effusion or pneumothorax. The cardiomediastinal silhouette is within normal limits. No acute fractures. IMPRESSION: No active cardiopulmonary disease. Electronically Signed   By: Ronney Asters M.D.   On: 01/13/2022 18:17      IMPRESSION AND PLAN:  Assessment and Plan: * Right hip joint effusion - The patient has sepsis with temperature of 101 and heart rate of 93 without other clear etiology. - This could be related to septic arthritis. - He will be admitted to a medical bed. - We will continue antibiotic therapy with IV cefepime and vancomycin. - We will follow blood cultures. - Orthopedic consultation will be obtained. - We will defer to the orthopedist about consideration of IR drainage.  Hypertension - We will continue Coreg and Cozaar.  Type 2 diabetes mellitus with end-stage renal disease ([place]) - We will place the patient on supplement coverage with NovoLog. - We will continue basal coverage.  Coronary artery disease We will continue ARB and beta-blocker therapy and briefly hold aspirin for potential IR.  ESRD on hemodialysis  ([place]) - M, W, F via LUE AV fistula - Nephrology consult to be obtained to follow-up on hemodialysis. - We will continue Renvela.     DVT prophylaxis: Heparin. Advanced Care Planning:  Code Status: full code. Family Communication:  The plan of care was discussed in details with the patient (and family). I answered all questions. The patient agreed to proceed with the above mentioned plan. Further management will depend upon hospital course. Disposition Plan: Back to previous home environment Consults called: Nephro GI and Ortho.  Consults. All the records are reviewed and case discussed with ED provider.  Status is: Inpatient   At the time of the admission, it appears that the appropriate admission status for this patient is inpatient.  This is judged to be reasonable and necessary in order to provide the required intensity of  service to ensure the patient's safety given the presenting symptoms, physical exam findings and initial radiographic and laboratory data in the context of comorbid conditions.  The patient requires inpatient status due to high intensity of service, high risk of further deterioration and high frequency of surveillance required.  I certify that at the time of admission, it is my clinical judgment that the patient will require inpatient hospital care extending more than 2 midnights.                            Dispo: The patient is from: Home              Anticipated d/c is to: Home              Patient currently is not medically stable to d/c.              Difficult to place patient: No  Christel Mormon M.D on 01/20/2022 at 1:05 AM  [facility]   From 7 PM-7 AM, contact night-coverage [email or web address]  CC: Primary care physician; Danelle Berry, NP

## 2022-01-05 NOTE — Progress Notes (Signed)
Pharmacy Antibiotic Note  [name] [name] is a 46 y.o. male w/ ESRD on MWF HD admitted on 12/31/2021 with infection of unknown source.  Pharmacy has been consulted for Cefepime & Vancomycin dosing x 7 days.  Plan: Pt ordered Cefepime 2 gm once Cefepime 1 gm q24hr per indication & renal fxn  Pt given Vancomycin 2500 mg once. Vancomycin 1000 mg IV MWF after dialysis  Pharmacy will continue to follow and will adjust abx dosing whenever warranted.   Temp (24hrs), Avg:100.1 F (37.8 C), Min:98.9 F (37.2 C), Max:101 F (38.3 C)   Recent Labs  Lab 01/24/2022 1621 01/10/2022 1821  WBC  --  14.4*  CREATININE  --  10.26*  LATICACIDVEN 1.3  --     CrCl cannot be calculated (Unknown ideal weight.).    Allergies  Allergen Reactions   Cephalexin Nausea Only   Ivp Dye [Iodinated Contrast Media] Diarrhea   Lisinopril Other (See Comments)    Pt cannot recall reaction, cough   Other     Antimicrobials this admission: 9/11 Cefepime >> x 7 days 9/11 Vancomycin >> x 7 days  Microbiology results: 9/10 BCx: Pending  Thank you for allowing pharmacy to be a part of this patient's care.  Renda Rolls, PharmD, MBA 01/11/2022 11:01 PM

## 2022-01-05 NOTE — ED Notes (Signed)
Pt presents to ED with c/o of R groin pain. Pt is a dialysis pt, pt is unaware he has a fever. Pt is A&Ox4. Pt denies any chills at this time. Pt states muscle relaxer has been RX'ed but pt states minimal relief with this.

## 2022-01-06 ENCOUNTER — Other Ambulatory Visit: Payer: Self-pay

## 2022-01-06 ENCOUNTER — Encounter: Payer: Self-pay | Admitting: Family Medicine

## 2022-01-06 ENCOUNTER — Encounter: Admission: EM | Disposition: E | Payer: Self-pay | Source: Home / Self Care | Attending: Internal Medicine

## 2022-01-06 ENCOUNTER — Inpatient Hospital Stay: Payer: Medicare Other | Admitting: Anesthesiology

## 2022-01-06 DIAGNOSIS — M25451 Effusion, right hip: Secondary | ICD-10-CM | POA: Diagnosis not present

## 2022-01-06 DIAGNOSIS — A411 Sepsis due to other specified staphylococcus: Secondary | ICD-10-CM | POA: Diagnosis present

## 2022-01-06 DIAGNOSIS — E1122 Type 2 diabetes mellitus with diabetic chronic kidney disease: Secondary | ICD-10-CM

## 2022-01-06 DIAGNOSIS — A419 Sepsis, unspecified organism: Secondary | ICD-10-CM

## 2022-01-06 HISTORY — PX: INCISION AND DRAINAGE: SHX5863

## 2022-01-06 LAB — SURGICAL PCR SCREEN
MRSA, PCR: NEGATIVE
Staphylococcus aureus: NEGATIVE

## 2022-01-06 LAB — SEDIMENTATION RATE: Sed Rate: >140 mm/hr — ABNORMAL HIGH (ref 0–15)

## 2022-01-06 LAB — BLOOD CULTURE ID PANEL (REFLEXED) - BCID2

## 2022-01-06 LAB — PREPARE RBC (CROSSMATCH)

## 2022-01-06 LAB — BASIC METABOLIC PANEL
Anion gap: 12 (ref 5–15)
BUN: 27 mg/dL — ABNORMAL HIGH (ref 6–20)
CO2: 23 mmol/L (ref 22–32)
Calcium: 8.3 mg/dL — ABNORMAL LOW (ref 8.9–10.3)
Chloride: 96 mmol/L — ABNORMAL LOW (ref 98–111)
Creatinine, Ser: 11.63 mg/dL — ABNORMAL HIGH (ref 0.61–1.24)
GFR, Estimated: 5 mL/min — ABNORMAL LOW (ref >60)
Glucose, Bld: 196 mg/dL — ABNORMAL HIGH (ref 70–99)
Potassium: 3.6 mmol/L (ref 3.5–5.1)
Sodium: 131 mmol/L — ABNORMAL LOW (ref 135–145)

## 2022-01-06 LAB — PROTIME-INR
INR: 1.2 (ref 0.8–1.2)
Prothrombin Time: 15.2 seconds (ref 11.4–15.2)

## 2022-01-06 LAB — CBC
HCT: 22.9 % — ABNORMAL LOW (ref 39.0–52.0)
Hemoglobin: 6.8 g/dL — ABNORMAL LOW (ref 13.0–17.0)
MCH: 25.1 pg — ABNORMAL LOW (ref 26.0–34.0)
MCHC: 29.7 g/dL — ABNORMAL LOW (ref 30.0–36.0)
MCV: 84.5 fL (ref 80.0–100.0)
Platelets: 278 10*3/uL (ref 150–400)
RBC: 2.71 MIL/uL — ABNORMAL LOW (ref 4.22–5.81)
RDW: 17.1 % — ABNORMAL HIGH (ref 11.5–15.5)
WBC: 12.5 10*3/uL — ABNORMAL HIGH (ref 4.0–10.5)
nRBC: 0 % (ref 0.0–0.2)

## 2022-01-06 LAB — HEMOGLOBIN A1C
Hgb A1c MFr Bld: 8.1 % — ABNORMAL HIGH (ref 4.8–5.6)
Mean Plasma Glucose: 185.77 mg/dL

## 2022-01-06 LAB — HEMOGLOBIN AND HEMATOCRIT, BLOOD
HCT: 25.8 % — ABNORMAL LOW (ref 39.0–52.0)
Hemoglobin: 7.8 g/dL — ABNORMAL LOW (ref 13.0–17.0)

## 2022-01-06 LAB — GLUCOSE, CAPILLARY
Glucose-Capillary: 169 mg/dL — ABNORMAL HIGH (ref 70–99)
Glucose-Capillary: 123 mg/dL — ABNORMAL HIGH (ref 70–99)
Glucose-Capillary: 119 mg/dL — ABNORMAL HIGH (ref 70–99)
Glucose-Capillary: 132 mg/dL — ABNORMAL HIGH (ref 70–99)
Glucose-Capillary: 194 mg/dL — ABNORMAL HIGH (ref 70–99)

## 2022-01-06 LAB — PROCALCITONIN: Procalcitonin: 3.13 ng/mL

## 2022-01-06 LAB — CORTISOL-AM, BLOOD: Cortisol - AM: 13.5 ug/dL (ref 6.7–22.6)

## 2022-01-06 SURGERY — INCISION AND DRAINAGE
Anesthesia: Choice | Site: Hip | Laterality: Right

## 2022-01-06 MED ORDER — FLEET ENEMA 7-19 GM/118ML RE ENEM: 1.0000 | ENEMA | Freq: Once | RECTAL | Status: DC | PRN

## 2022-01-06 MED ORDER — FENTANYL CITRATE (PF) 100 MCG/2ML IJ SOLN
25.0000 ug | INTRAMUSCULAR | Status: DC | PRN
  Administered 2022-01-06 (×2): 25 ug via INTRAVENOUS

## 2022-01-06 MED ORDER — PHENYLEPHRINE 80 MCG/ML (10ML) SYRINGE FOR IV PUSH (FOR BLOOD PRESSURE SUPPORT)
PREFILLED_SYRINGE | INTRAVENOUS | Status: AC
  Filled 2022-01-06: qty 20

## 2022-01-06 MED ORDER — METOCLOPRAMIDE HCL 5 MG PO TABS: 5.0000 mg | ORAL_TABLET | Freq: Three times a day (TID) | ORAL | Status: DC | PRN

## 2022-01-06 MED ORDER — LIDOCAINE HCL (PF) 2 % IJ SOLN
INTRAMUSCULAR | Status: AC
  Filled 2022-01-06: qty 5

## 2022-01-06 MED ORDER — ONDANSETRON HCL 4 MG PO TABS: 4.0000 mg | ORAL_TABLET | Freq: Four times a day (QID) | ORAL | Status: DC | PRN

## 2022-01-06 MED ORDER — TRANEXAMIC ACID-NACL 1000-0.7 MG/100ML-% IV SOLN
INTRAVENOUS | Status: DC | PRN
  Administered 2022-01-06: 1000 mg via INTRAVENOUS

## 2022-01-06 MED ORDER — MIDAZOLAM HCL 2 MG/2ML IJ SOLN
INTRAMUSCULAR | Status: DC | PRN
  Administered 2022-01-06 (×2): 1 mg via INTRAVENOUS

## 2022-01-06 MED ORDER — 0.9 % SODIUM CHLORIDE (POUR BTL) OPTIME
TOPICAL | Status: DC | PRN
  Administered 2022-01-06: 500 mL

## 2022-01-06 MED ORDER — ONDANSETRON HCL 4 MG/2ML IJ SOLN
INTRAMUSCULAR | Status: AC
  Filled 2022-01-06: qty 2

## 2022-01-06 MED ORDER — GLYCOPYRROLATE 0.2 MG/ML IJ SOLN
INTRAMUSCULAR | Status: DC | PRN
  Administered 2022-01-06: .6 mg via INTRAVENOUS

## 2022-01-06 MED ORDER — DIPHENHYDRAMINE HCL 12.5 MG/5ML PO ELIX
12.5000 mg | ORAL_SOLUTION | ORAL | Status: DC | PRN
  Administered 2022-01-07: 25 mg via ORAL
  Filled 2022-01-06: qty 10

## 2022-01-06 MED ORDER — FENTANYL CITRATE (PF) 100 MCG/2ML IJ SOLN
INTRAMUSCULAR | Status: AC
  Filled 2022-01-06: qty 2

## 2022-01-06 MED ORDER — DEXAMETHASONE SODIUM PHOSPHATE 10 MG/ML IJ SOLN
INTRAMUSCULAR | Status: AC
  Filled 2022-01-06: qty 1

## 2022-01-06 MED ORDER — TRANEXAMIC ACID 1000 MG/10ML IV SOLN
INTRAVENOUS | Status: AC
  Filled 2022-01-06: qty 10

## 2022-01-06 MED ORDER — BISACODYL 10 MG RE SUPP: 10.0000 mg | Freq: Every day | RECTAL | Status: DC | PRN

## 2022-01-06 MED ORDER — HEPARIN SODIUM (PORCINE) 1000 UNIT/ML DIALYSIS: 1000.0000 [IU] | INTRAMUSCULAR | Status: DC | PRN

## 2022-01-06 MED ORDER — PENTAFLUOROPROP-TETRAFLUOROETH EX AERO
1.0000 | INHALATION_SPRAY | CUTANEOUS | Status: DC | PRN
Start: 2022-01-06 — End: 2022-01-06

## 2022-01-06 MED ORDER — SODIUM CHLORIDE 0.9% IV SOLUTION: Freq: Once | INTRAVENOUS | Status: AC

## 2022-01-06 MED ORDER — SODIUM CHLORIDE 0.9 % IR SOLN
Status: DC | PRN
  Administered 2022-01-06: 6000 mL

## 2022-01-06 MED ORDER — PROPOFOL 10 MG/ML IV BOLUS
INTRAVENOUS | Status: DC | PRN
  Administered 2022-01-06: 50 mg via INTRAVENOUS
  Administered 2022-01-06: 150 mg via INTRAVENOUS

## 2022-01-06 MED ORDER — ACETAMINOPHEN 10 MG/ML IV SOLN
INTRAVENOUS | Status: DC | PRN
  Administered 2022-01-06: 1000 mg via INTRAVENOUS

## 2022-01-06 MED ORDER — SODIUM CHLORIDE 0.9 % IV SOLN: INTRAVENOUS | Status: DC | PRN

## 2022-01-06 MED ORDER — EPHEDRINE SULFATE (PRESSORS) 50 MG/ML IJ SOLN
INTRAMUSCULAR | Status: DC | PRN
  Administered 2022-01-06: 10 mg via INTRAVENOUS

## 2022-01-06 MED ORDER — ACETAMINOPHEN 325 MG PO TABS: 325.0000 mg | ORAL_TABLET | Freq: Four times a day (QID) | ORAL | Status: DC | PRN

## 2022-01-06 MED ORDER — PHENYLEPHRINE 80 MCG/ML (10ML) SYRINGE FOR IV PUSH (FOR BLOOD PRESSURE SUPPORT)
PREFILLED_SYRINGE | INTRAVENOUS | Status: DC | PRN
  Administered 2022-01-06: 40 ug via INTRAVENOUS
  Administered 2022-01-06: 80 ug via INTRAVENOUS
  Administered 2022-01-06: 160 ug via INTRAVENOUS
  Administered 2022-01-06: 40 ug via INTRAVENOUS

## 2022-01-06 MED ORDER — DEXAMETHASONE SODIUM PHOSPHATE 10 MG/ML IJ SOLN
INTRAMUSCULAR | Status: DC | PRN
  Administered 2022-01-06: 5 mg via INTRAVENOUS

## 2022-01-06 MED ORDER — ALTEPLASE 2 MG IJ SOLR: 2.0000 mg | Freq: Once | INTRAMUSCULAR | Status: DC | PRN

## 2022-01-06 MED ORDER — HEPARIN SODIUM (PORCINE) 1000 UNIT/ML IJ SOLN
INTRAMUSCULAR | Status: AC
  Filled 2022-01-06: qty 10

## 2022-01-06 MED ORDER — LIDOCAINE HCL (CARDIAC) PF 100 MG/5ML IV SOSY
PREFILLED_SYRINGE | INTRAVENOUS | Status: DC | PRN
  Administered 2022-01-06: 100 mg via INTRAVENOUS

## 2022-01-06 MED ORDER — ANTICOAGULANT SODIUM CITRATE 4% (200MG/5ML) IV SOLN
5.0000 mL | Status: DC | PRN
Start: 2022-01-06 — End: 2022-01-06

## 2022-01-06 MED ORDER — FENTANYL CITRATE (PF) 100 MCG/2ML IJ SOLN
INTRAMUSCULAR | Status: DC | PRN
  Administered 2022-01-06: 25 ug via INTRAVENOUS
  Administered 2022-01-06: 50 ug via INTRAVENOUS
  Administered 2022-01-06: 25 ug via INTRAVENOUS

## 2022-01-06 MED ORDER — ONDANSETRON HCL 4 MG/2ML IJ SOLN: 4.0000 mg | Freq: Four times a day (QID) | INTRAMUSCULAR | Status: DC | PRN

## 2022-01-06 MED ORDER — ACETAMINOPHEN 10 MG/ML IV SOLN
INTRAVENOUS | Status: AC
  Filled 2022-01-06: qty 100

## 2022-01-06 MED ORDER — SODIUM CHLORIDE 0.9 % IR SOLN
Status: DC | PRN
  Administered 2022-01-06: 3004 mL

## 2022-01-06 MED ORDER — ONDANSETRON HCL 4 MG/2ML IJ SOLN: 4.0000 mg | Freq: Once | INTRAMUSCULAR | Status: DC | PRN

## 2022-01-06 MED ORDER — NEOSTIGMINE METHYLSULFATE 10 MG/10ML IV SOLN
INTRAVENOUS | Status: AC
  Filled 2022-01-06: qty 1

## 2022-01-06 MED ORDER — MORPHINE SULFATE (PF) 2 MG/ML IV SOLN
2.0000 mg | INTRAVENOUS | Status: DC | PRN
  Administered 2022-01-06 – 2022-01-07 (×2): 2 mg via INTRAVENOUS
  Filled 2022-01-06 (×2): qty 1

## 2022-01-06 MED ORDER — ROCURONIUM BROMIDE 10 MG/ML (PF) SYRINGE
PREFILLED_SYRINGE | INTRAVENOUS | Status: AC
  Filled 2022-01-06: qty 10

## 2022-01-06 MED ORDER — ROCURONIUM BROMIDE 100 MG/10ML IV SOLN
INTRAVENOUS | Status: DC | PRN
  Administered 2022-01-06: 20 mg via INTRAVENOUS
  Administered 2022-01-06: 40 mg via INTRAVENOUS

## 2022-01-06 MED ORDER — MIDAZOLAM HCL 2 MG/2ML IJ SOLN
INTRAMUSCULAR | Status: AC
  Filled 2022-01-06: qty 2

## 2022-01-06 MED ORDER — PHENYLEPHRINE HCL-NACL 20-0.9 MG/250ML-% IV SOLN
INTRAVENOUS | Status: AC
  Filled 2022-01-06: qty 250

## 2022-01-06 MED ORDER — METOCLOPRAMIDE HCL 5 MG/ML IJ SOLN: 5.0000 mg | Freq: Three times a day (TID) | INTRAMUSCULAR | Status: DC | PRN

## 2022-01-06 MED ORDER — PROPOFOL 10 MG/ML IV BOLUS
INTRAVENOUS | Status: AC
  Filled 2022-01-06: qty 20

## 2022-01-06 MED ORDER — ACETAMINOPHEN 500 MG PO TABS: 500.0000 mg | ORAL_TABLET | Freq: Four times a day (QID) | ORAL | Status: DC

## 2022-01-06 MED ORDER — LIDOCAINE-PRILOCAINE 2.5-2.5 % EX CREA
1.0000 | TOPICAL_CREAM | CUTANEOUS | Status: DC | PRN
Start: 2022-01-06 — End: 2022-01-06

## 2022-01-06 MED ORDER — NEOSTIGMINE METHYLSULFATE 10 MG/10ML IV SOLN
INTRAVENOUS | Status: DC | PRN
  Administered 2022-01-06: 3 mg via INTRAVENOUS

## 2022-01-06 MED ORDER — DOCUSATE SODIUM 100 MG PO CAPS
100.0000 mg | ORAL_CAPSULE | Freq: Two times a day (BID) | ORAL | Status: DC
  Administered 2022-01-06 – 2022-01-08 (×5): 100 mg via ORAL
  Filled 2022-01-06 (×5): qty 1

## 2022-01-06 MED ORDER — MAGNESIUM HYDROXIDE 400 MG/5ML PO SUSP: 30.0000 mL | Freq: Every day | ORAL | Status: DC | PRN

## 2022-01-06 MED ORDER — LIDOCAINE HCL (PF) 1 % IJ SOLN: 5.0000 mL | INTRAMUSCULAR | Status: DC | PRN

## 2022-01-06 MED ORDER — CHLORHEXIDINE GLUCONATE CLOTH 2 % EX PADS
6.0000 | MEDICATED_PAD | Freq: Every day | CUTANEOUS | Status: DC
  Administered 2022-01-06 – 2022-01-08 (×3): 6 via TOPICAL

## 2022-01-06 SURGICAL SUPPLY — 45 items
BLADE SURG SZ10 CARB STEEL (BLADE) ×1 IMPLANT
BNDG COHESIVE 4X5 TAN STRL LF (GAUZE/BANDAGES/DRESSINGS) ×1 IMPLANT
BNDG ESMARK 4X12 TAN STRL LF (GAUZE/BANDAGES/DRESSINGS) ×1 IMPLANT
CHLORAPREP W/TINT 26 (MISCELLANEOUS) ×1 IMPLANT
CUFF TOURN SGL QUICK 18X4 (TOURNIQUET CUFF) IMPLANT
CUFF TOURN SGL QUICK 24 (TOURNIQUET CUFF)
CUFF TRNQT CYL 24X4X16.5-23 (TOURNIQUET CUFF) IMPLANT
DRAPE INCISE IOBAN 66X45 STRL (DRAPES) IMPLANT
DRSG OPSITE POSTOP 4X8 (GAUZE/BANDAGES/DRESSINGS) IMPLANT
DRSG TEGADERM 4X4.75 (GAUZE/BANDAGES/DRESSINGS) IMPLANT
DRSG XEROFORM 1X8 (GAUZE/BANDAGES/DRESSINGS) IMPLANT
ELECT REM PT RETURN 9FT ADLT (ELECTROSURGICAL) ×1
ELECTRODE REM PT RTRN 9FT ADLT (ELECTROSURGICAL) ×1 IMPLANT
GAUZE SPONGE 4X4 12PLY STRL (GAUZE/BANDAGES/DRESSINGS) IMPLANT
GLOVE BIO SURGEON STRL SZ8 (GLOVE) ×1 IMPLANT
GLOVE SURG ORTHO 8.5 STRL (GLOVE) ×1 IMPLANT
GLOVE SURG UNDER LTX SZ8 (GLOVE) ×1 IMPLANT
GOWN STRL REUS W/ TWL LRG LVL3 (GOWN DISPOSABLE) ×1 IMPLANT
GOWN STRL REUS W/ TWL XL LVL3 (GOWN DISPOSABLE) ×1 IMPLANT
GOWN STRL REUS W/TWL LRG LVL3 (GOWN DISPOSABLE) ×1
GOWN STRL REUS W/TWL XL LVL3 (GOWN DISPOSABLE) ×1
HANDLE YANKAUER SUCT BULB TIP (MISCELLANEOUS) IMPLANT
KIT TURNOVER KIT A (KITS) ×1 IMPLANT
LABEL OR SOLS (LABEL) ×1 IMPLANT
MANIFOLD NEPTUNE II (INSTRUMENTS) ×1 IMPLANT
NDL SAFETY ECLIP 18X1.5 (MISCELLANEOUS) ×1 IMPLANT
NS IRRIG 1000ML POUR BTL (IV SOLUTION) ×1 IMPLANT
PACK EXTREMITY ARMC (MISCELLANEOUS) ×1 IMPLANT
PAD CAST 4YDX4 CTTN HI CHSV (CAST SUPPLIES) ×1 IMPLANT
PADDING CAST COTTON 4X4 STRL (CAST SUPPLIES) ×1
SPLINT CAST 1 STEP 3X12 (MISCELLANEOUS) ×1 IMPLANT
SPONGE T-LAP 18X18 ~~LOC~~+RFID (SPONGE) ×1 IMPLANT
STAPLER SKIN PROX 35W (STAPLE) ×1 IMPLANT
STOCKINETTE BIAS CUT 4 980044 (GAUZE/BANDAGES/DRESSINGS) ×1 IMPLANT
STOCKINETTE IMPERVIOUS 9X36 MD (GAUZE/BANDAGES/DRESSINGS) ×1 IMPLANT
SUT ETHILON 4-0 (SUTURE) ×1
SUT ETHILON 4-0 FS2 18XMFL BLK (SUTURE) ×1
SUT VIC AB 1 CT1 36 (SUTURE) IMPLANT
SUT VIC AB 4-0 SH 27 (SUTURE) ×1
SUT VIC AB 4-0 SH 27XANBCTRL (SUTURE) ×1 IMPLANT
SUT VICRYL+ 3-0 36IN CT-1 (SUTURE) ×1 IMPLANT
SUTURE ETHLN 4-0 FS2 18XMF BLK (SUTURE) ×1 IMPLANT
SYR 10ML LL (SYRINGE) ×1 IMPLANT
TRAP FLUID SMOKE EVACUATOR (MISCELLANEOUS) ×1 IMPLANT
WATER STERILE IRR 500ML POUR (IV SOLUTION) ×1 IMPLANT

## 2022-01-06 NOTE — Progress Notes (Signed)
HD treatment of 3.5 hours completed without complication with uf of 1.5 liters Pt got 1 unit of PRBC and Vancomycin 1g during HD treatment. Post Hd report given to primary RN Post HD vitals, BP 177/80, O2 sat 98, P 82, T98.52f

## 2022-01-06 NOTE — Op Note (Signed)
01/13/2022 - 01/02/2022  7:26 PM  Patient:   [name] [name]  Pre-Op Diagnosis:   Septic arthritis, right hip  Post-Op Diagnosis:   Same  Procedure:   Open irrigation and debridement of septic right hip.  Surgeon:   Pascal Lux, MD  Assistant:   Cameron Proud, PA-C  Anesthesia:   GET  Findings:   As above.  Complications:   None  Fluids:   500 cc crystalloid  EBL:   100 cc  UOP:   None  TT:   None  Drains:   Large Hemovac x1  Closure:   Staples  Brief Clinical Note:   The patient is a 46 year old male with multiple medical problems including hypertension, coronary artery disease, insulin-dependent diabetes hemodialysis dependent renal failure, diabetic retinopathy, and gastroesophageal reflux disease who presented to the emergency room with a 3 day history of progressive right hip/groin pain with a fever to 101.  Work-up in the emergency room, and including an MRI scan of the pelvis/right hip demonstrated evidence of a right hip effusion.  The patient has undergone dialysis this morning and has been cleared medically.  He presents at this time for an open irrigation and debridement of the right hip.  Procedure:   The patient was brought into the operating room and lain in the supine position.  After adequate general endotracheal intubation and anesthesia were obtained, the patient was rolled into the left lateral decubitus position and secured using a lateral hip positioner.  The right hip and lower extremity were prepped with ChloraPrep solution before being draped sterilely.  Preoperative antibiotics were administered.  A timeout was performed to verify the appropriate surgical site.    An approximately 4 to 5 inch incision was made over the lateral aspect of the right hip.  The incision was carried down through the subcutaneous tissues to expose the iliotibial band.  This was split the length of the incision to expose the trochanteric bursal tissues beneath.  These were  swept posteriorly to expose the short external rotators.  The short external rotators and quadratus femoris tendon were released from the posterior aspect of the hip along with the underlying capsule to provide access into the joint.  Copious cloudy turbid fluid was encountered.  Cultures were obtained and sent.  This fluid was suctioned.    After ensuring that the joint was well exposed, the joint was irrigated thoroughly using 3 L of sterile saline solution followed by 3 L of antibiotic irrigation followed by another 3 L of sterile saline solution, all using the jet lavage system.  Hemostasis was achieved using electrocautery.  A large Hemovac drain was placed into the joint before the iliotibial band was reapproximated using #1 Vicryl interrupted sutures.  The subcutaneous tissues were closed using #0 and 2-0 Vicryl interrupted sutures before the skin was closed using staples.  Sterile occlusive dressings were applied to the hip and drain site before the patient was rolled back into the supine position.  He was then awakened, extubated, and returned to the recovery room in satisfactory condition after tolerating the procedure well.

## 2022-01-06 NOTE — Plan of Care (Signed)

## 2022-01-06 NOTE — Assessment & Plan Note (Signed)
-   We will continue Coreg and Cozaar.

## 2022-01-06 NOTE — Anesthesia Procedure Notes (Signed)
Procedure Name: Intubation Date/Time: 01/11/2022 6:08 PM  Performed by: Loletha Grayer, CRNAPre-anesthesia Checklist: Patient identified, Patient being monitored, Timeout performed, Emergency Drugs available and Suction available Patient Re-evaluated:Patient Re-evaluated prior to induction Oxygen Delivery Method: Circle system utilized Preoxygenation: Pre-oxygenation with 100% oxygen Induction Type: IV induction Ventilation: Mask ventilation without difficulty Laryngoscope Size: McGraph and 4 Grade View: Grade I Tube type: Oral Tube size: 7.5 mm Number of attempts: 1 Airway Equipment and Method: Stylet Placement Confirmation: ETT inserted through vocal cords under direct vision, positive ETCO2 and breath sounds checked- equal and bilateral Secured at: 22 cm Tube secured with: Tape Dental Injury: Teeth and Oropharynx as per pre-operative assessment

## 2022-01-06 NOTE — Assessment & Plan Note (Signed)
-   The patient has sepsis with temperature of 101 and heart rate of 93 without other clear etiology. - This could be related to septic arthritis. - He will be admitted to a medical bed. - We will continue antibiotic therapy with IV cefepime and vancomycin. - We will follow blood cultures. - Orthopedic consultation will be obtained. - We will defer to the orthopedist about consideration of IR drainage.

## 2022-01-06 NOTE — Assessment & Plan Note (Addendum)
We will continue ARB and beta-blocker therapy and briefly hold aspirin for potential IR.

## 2022-01-06 NOTE — Assessment & Plan Note (Addendum)
-   Nephrology consult to be obtained to follow-up on hemodialysis. - We will continue Renvela.

## 2022-01-06 NOTE — Consult Note (Signed)
ORTHOPAEDIC CONSULTATION  REQUESTING PHYSICIAN: Sidney Ace, MD  Chief Complaint:   Right hip pain.  History of Present Illness: [name] [name] is a 46 y.o. male with multiple medical problems including hypertension, coronary artery disease, diabetes, chronic renal insufficiency requiring hemodialysis 3 times per week, [place] retinopathy, and gastroesophageal reflux disease who developed right hip/groin pain about 3-4 days ago.  He began to have increased difficulty with ambulation, prompting him to present to the emergency room last evening.  The patient was noted to have a fever to 101 and had an elevated white count with a left shift.  Subsequent imaging, including an MRI scan, demonstrated evidence of a right hip effusion.  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   CHF (congestive heart failure) (HCC)    Coronary artery disease    Diabetes mellitus without complication ([place])    Dialysis patient [facility])    M-W-F   GERD (gastroesophageal reflux disease)    Hypertension    Kyrle's disease    Renal disorder    on dialysis for 4 years three times a week last was Tuesday   Secondary hyperparathyroidism of renal origin ([place])    Shortness of breath dyspnea    Type 2 diabetes mellitus with diabetic nephropathy (HCC)    Type 2 diabetes mellitus with diabetic retinopathy [facility])    Past Surgical History:  Procedure Laterality Date   A/V FISTULAGRAM Left 10/01/2017   Procedure: A/V FISTULAGRAM;  Surgeon: Algernon Huxley, MD;  Location: [place] [facility];  Service: Cardiovascular;  Laterality: Left;   A/V FISTULAGRAM Left 05/17/2020   Procedure: A/V FISTULAGRAM;  Surgeon: Algernon Huxley, MD;  Location: [place] [facility];  Service: Cardiovascular;  Laterality: Left;   A/V  SHUNTOGRAM Left 04/16/2021   Procedure: A/V SHUNTOGRAM;  Surgeon: Katha Cabal, MD;  Location: [place] [facility];  Service: Cardiovascular;  Laterality: Left;   A/V SHUNTOGRAM Left 08/29/2021   Procedure: A/V SHUNTOGRAM;  Surgeon: Algernon Huxley, MD;  Location: [place] [facility];  Service: Cardiovascular;  Laterality: Left;   CATARACT EXTRACTION W/PHACO Right 06/30/2018   Procedure: CATARACT EXTRACTION PHACO AND INTRAOCULAR LENS PLACEMENT (IOC)-RIGHT;  Surgeon: Eulogio Bear, MD;  Location: [facility] ORS;  Service: Ophthalmology;  Laterality: Right;  [place] 00:07.0 CDE 0.16 FLUID PACK lOT # [id number]   CATARACT EXTRACTION W/PHACO Left 12/24/2018   Procedure: CATARACT EXTRACTION PHACO AND INTRAOCULAR LENS PLACEMENT (IOC);  Surgeon: Eulogio Bear, MD;  Location: [facility] ORS;  Service: Ophthalmology;  Laterality: Left;  [place] 00:30.0 CDE 2.01 FLUID PACK LOT # [id number]   COLONOSCOPY WITH PROPOFOL N/A 04/23/2017   Procedure: COLONOSCOPY WITH PROPOFOL;  Surgeon: [name], MD;  Location: [facility] ENDOSCOPY;  Service: Endoscopy;  Laterality: N/A;   COLONOSCOPY WITH PROPOFOL N/A 07/02/2017   Procedure: COLONOSCOPY WITH PROPOFOL;  Surgeon: [name], MD;  Location: [facility] ENDOSCOPY;  Service: Endoscopy;  Laterality: N/A;   DIALYSIS/PERMA CATHETER INSERTION N/A 09/27/2020   Procedure: DIALYSIS/PERMA CATHETER INSERTION;  Surgeon: Algernon Huxley, MD;  Location: [place] [facility];  Service: Cardiovascular;  Laterality: N/A;   DIALYSIS/PERMA CATHETER INSERTION N/A 09/19/2021   Procedure: DIALYSIS/PERMA CATHETER INSERTION;  Surgeon: Algernon Huxley, MD;  Location: [place] [facility];  Service: Cardiovascular;  Laterality: N/A;   DIALYSIS/PERMA CATHETER REMOVAL N/A 11/01/2020   Procedure: DIALYSIS/PERMA  CATHETER REMOVAL;  Surgeon: Algernon Huxley, MD;  Location: [place] [facility];  Service: Cardiovascular;  Laterality: N/A;   ESOPHAGOGASTRODUODENOSCOPY (EGD) WITH PROPOFOL N/A 04/23/2017   Procedure:  ESOPHAGOGASTRODUODENOSCOPY (EGD) WITH PROPOFOL;  Surgeon: [name], MD;  Location: [facility] ENDOSCOPY;  Service: Endoscopy;  Laterality: N/A;   ESOPHAGOGASTRODUODENOSCOPY (EGD) WITH PROPOFOL N/A 03/27/2020   Procedure: ESOPHAGOGASTRODUODENOSCOPY (EGD) WITH PROPOFOL;  Surgeon: Lesly Rubenstein, MD;  Location: [facility] ENDOSCOPY;  Service: Endoscopy;  Laterality: N/A;   EYE SURGERY     RETINA   INSERTION OF DIALYSIS CATHETER Right 08/09/2020   Procedure: INSERTION OF DIALYSIS CATHETER;  Surgeon: Algernon Huxley, MD;  Location: [facility] ORS;  Service: Vascular;  Laterality: Right;   LEFT HEART CATH AND CORONARY ANGIOGRAPHY N/A 08/27/2021   Procedure: LEFT HEART CATH AND CORONARY ANGIOGRAPHY;  Surgeon: Dionisio David, MD;  Location: [place] [facility];  Service: Cardiovascular;  Laterality: N/A;   PERIPHERAL VASCULAR CATHETERIZATION N/A 05/07/2015   Procedure: A/V Shuntogram/Fistulagram;  Surgeon: Katha Cabal, MD;  Location: [place] [facility];  Service: Cardiovascular;  Laterality: N/A;   PERIPHERAL VASCULAR CATHETERIZATION Left 05/07/2015   Procedure: A/V Shunt Intervention;  Surgeon: Katha Cabal, MD;  Location: [place] [facility];  Service: Cardiovascular;  Laterality: Left;   PERIPHERAL VASCULAR THROMBECTOMY Left 07/16/2020   Procedure: PERIPHERAL VASCULAR THROMBECTOMY;  Surgeon: Algernon Huxley, MD;  Location: [place] [facility];  Service: Cardiovascular;  Laterality: Left;   PERIPHERAL VASCULAR THROMBECTOMY Left 08/27/2020   Procedure: PERIPHERAL VASCULAR THROMBECTOMY;  Surgeon: Algernon Huxley, MD;  Location: [place] [facility];  Service: Cardiovascular;  Laterality: Left;   PERIPHERAL VASCULAR THROMBECTOMY N/A 09/26/2021   Procedure: PERIPHERAL VASCULAR THROMBECTOMY;  Surgeon: Algernon Huxley, MD;  Location: [place] [facility];  Service: Cardiovascular;  Laterality: N/A;   REVISON OF ARTERIOVENOUS FISTULA Left 08/09/2020   Procedure: REVISON OF ARTERIOVENOUS FISTULA;  Surgeon: Algernon Huxley,  MD;  Location: [facility];  Service: Vascular;  Laterality: Left;   TEE WITHOUT CARDIOVERSION N/A 07/05/2015   Procedure: TRANSESOPHAGEAL ECHOCARDIOGRAM (TEE);  Surgeon: Dionisio David, MD;  Location: [facility] OR[facility];  Service: Cardiovascular;  Laterality: N/A;   UPPER EXTREMITY ANGIOGRAPHY Left 06/24/2018   Procedure: UPPER EXTREMITY ANGIOGRAPHY;  Surgeon: Algernon Huxley, MD;  Location: [place] [facility];  Service: Cardiovascular;  Laterality: Left;   VASCULAR ACCESS DEVICE INSERTION Left 07/11/2021   Procedure: INSERTION OF HERO VASCULAR ACCESS DEVICE;  Surgeon: Algernon Huxley, MD;  Location: [facility];  Service: Vascular;  Laterality: Left;   WOUND DEBRIDEMENT N/A 09/25/2020   Procedure: DEBRIDEMENT WOUND;  Surgeon: Katha Cabal, MD;  Location: [facility] ORS;  Service: Vascular;  Laterality: N/A;   Social History   Socioeconomic History   Marital status: Single    Spouse name: Not on file   Number of children: Not on file   Years of education: Not on file   Highest education level: Not on file  Occupational History   Not on file  Tobacco Use   Smoking status: Never   Smokeless tobacco: Never  Vaping Use   Vaping Use: Never used  Substance and Sexual Activity   Alcohol use: Not Currently    Comment: occ. shot   Drug use: No   Sexual activity: Not on file  Other Topics Concern   Not on file  Social History Narrative   Lives by himself   Social Determinants of Health   Financial Resource Strain: Not  on file  Food Insecurity: Unknown (01/10/2022)   Hunger Vital Sign    Worried About Running Out of Food in the Last Year: Patient refused    [place] in the Last Year: Patient refused  Transportation Needs: Unknown (12/29/2021)   PRAPARE - Hydrologist (Medical): Patient refused    Lack of Transportation (Non-Medical): Patient refused  Physical Activity: Not on file  Stress: Not on file  Social Connections: Not on file   Family History  Problem Relation  Age of Onset   Diabetes Father    Allergies  Allergen Reactions   Cephalexin Nausea Only   Ivp Dye [Iodinated Contrast Media] Diarrhea   Lisinopril Other (See Comments)    Pt cannot recall reaction, cough   Other    Prior to Admission medications   Medication Sig Start Date End Date Taking? Authorizing Provider  aspirin EC 81 MG tablet Take 81 mg by mouth daily at 6 PM. 01/14/16  Yes [provider]  Beta Carotene (VITAMIN A) 25000 UNIT capsule Take 25,000 Units by mouth daily.   Yes [provider]  carvedilol (COREG) 25 MG tablet Take 25 mg by mouth 2 (two) times daily. 02/24/21  Yes [provider]  cinacalcet (SENSIPAR) 30 MG tablet Take 30 mg by mouth daily. 03/26/21  Yes [provider]  HUMULIN 70/30 (70-30) 100 UNIT/ML injection Inject 15-30 Units into the skin 2 (two) times daily with a meal. 04/19/15  Yes [provider]  losartan (COZAAR) 50 MG tablet Take 50 mg by mouth daily. 05/22/18  Yes [provider]  omeprazole (PRILOSEC) 40 MG capsule Take 40 mg by mouth daily. 04/09/21  Yes [provider]  sevelamer carbonate (RENVELA) 2.4 g PACK Take 2.4 g by mouth 3 (three) times daily with meals. 02/27/20  Yes [provider]  vitamin E 1000 UNIT capsule Take 1,000 Units by mouth daily. Not sure of dose; started 4/29   Yes [provider]  cyclobenzaprine (FLEXERIL) 5 MG tablet Take 5 mg by mouth at bedtime as needed. 11/12/21   [provider]  hydrOXYzine (ATARAX) 25 MG tablet Take 25 mg by mouth at bedtime. Patient not taking: Reported on 01/17/2022 07/06/21   [provider]  Lidocaine HCl 4 % LIQD Apply 1 Application topically as needed. 08/05/21   [provider]  loperamide (IMODIUM) 2 MG capsule Take 2 mg by mouth as needed for diarrhea or loose stools (Take 1/2-1 tablet as meeded to prevent diarrhea. Can take up to 1 tablet every 4 hours. Do not exceed 4 tablets in a day.).     [provider]   MR HIP RIGHT WO CONTRAST  Result Date: 01/19/2022 CLINICAL DATA:  Right hip pain. Pain with movement. Patient has a fever. EXAM: MR OF THE RIGHT HIP WITHOUT CONTRAST TECHNIQUE: Multiplanar, multisequence MR imaging was performed. No intravenous contrast was administered. COMPARISON:  None Available. FINDINGS: Bones: No hip fracture, dislocation or avascular necrosis. Mild marrow edema in the right medial femoral head and acetabulum. No periosteal reaction or bone destruction. No aggressive osseous lesion. Normal sacrum and sacroiliac joints. No SI joint widening or erosive changes. Articular cartilage and labrum Articular cartilage: High-grade partial-thickness cartilage loss of the right femoral head and acetabulum. Labrum: Grossly intact, but evaluation is limited by lack of intraarticular fluid. Joint or bursal effusion Joint effusion: Large right hip joint effusion. No left joint effusion. No SI joint effusion. Bursae:  No  bursal fluid. Muscles and tendons Mild soft tissue edema in the right adductor musculature and right gluteus maximus muscle adjacent to the trochanteric insertion. Other findings No pelvic free fluid. No fluid collection or hematoma. No inguinal lymphadenopathy. No inguinal hernia. IMPRESSION: 1. High-grade partial-thickness cartilage loss of the right femoral head and acetabulum. Mild marrow edema in the right medial femoral head and acetabulum. Large right hip joint effusion. Mild soft tissue edema in the right adductor musculature and right gluteus maximus muscle adjacent to the trochanteric insertion. The overall appearance is concerning for septic arthritis. If there is clinical concern regarding septic arthritis, recommend arthrocentesis. These results will be called to the ordering clinician or representative by the Radiologist Assistant, and communication documented in the PACS or [employer]. Electronically Signed   By: Kathreen Devoid M.D.   On:  01/22/2022 07:39   DG Chest 2 View  Result Date: 12/29/2021 CLINICAL DATA:  Suspected sepsis. EXAM: CHEST - 2 VIEW COMPARISON:  Chest x-ray 10/26/2021 FINDINGS: Right-sided central venous catheter tip projects over the distal SVC. Left-sided central venous catheter tip projects over the cavoatrial junction. There is a vascular stent in the left axillary region. The lungs are clear. There is no pleural effusion or pneumothorax. The cardiomediastinal silhouette is within normal limits. No acute fractures. IMPRESSION: No active cardiopulmonary disease. Electronically Signed   By: Ronney Asters M.D.   On: 01/12/2022 18:17    Positive ROS: All other systems have been reviewed and were otherwise negative with the exception of those mentioned in the HPI and as above.  Physical Exam: General:  Alert, no acute distress Psychiatric:  Patient is competent for consent with normal mood and affect   Cardiovascular:  No pedal edema Respiratory:  No wheezing, non-labored breathing GI:  Abdomen is soft and non-tender Skin:  No lesions in the area of chief complaint Neurologic:  Sensation intact distally Lymphatic:  No axillary or cervical lymphadenopathy  Orthopedic Exam:  Orthopedic examination is limited to the right hip and lower extremity.  The leg is held in a somewhat flexed and slightly externally rotated position.  Skin inspection and around the right hip is unremarkable.  No swelling, erythema, ecchymosis, abrasions, or other skin abnormalities are identified.  He has mild tenderness palpation around the hip.  He has more severe pain with any attempted active or passive motion of the hip.  He is grossly neurovascularly intact to the right lower extremity and foot.  X-rays:  A recent MRI scan of the pelvis and right hip is available for review and has been reviewed by myself.  By report, the study demonstrates evidence of a moderate effusion in the hip, but no fractures or significant degenerative  changes are identified.  There is mildly increased signal in the gluteus medius and minimus tendons adjacent to the joint.  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  Plan: The treatment options, including both surgical and nonsurgical choices, have been discussed in detail with the patient.  The patient would like to proceed with surgical intervention to include an open irrigation and debridement of the septic right hip.  The risks (including bleeding, persistent or recurrent infection, nerve and/or blood vessel injury, persistent or recurrent pain, progression to arthritis, stiffness of the hip, need for further surgery, blood clots, strokes, heart attacks or arrhythmias, pneumonia, etc.) and benefits of the surgical procedure were discussed.  The patient states his understanding and agrees to proceed.  He agrees to a blood transfusion if necessary.  A  formal written consent will be obtained by the nursing staff.  Thank you for asking me to participate in the care of this most unfortunate gentleman.  I will be happy to follow him with you.   Pascal Lux, MD  Beeper #:  [phone]  12/29/2021 8:12 AM

## 2022-01-06 NOTE — Progress Notes (Signed)
[facility]  ROUNDING NOTE   Subjective:   [name] [name] is a 46 year old male with past medical conditions including CAD, diabetes, GERD, CHF, hypertension, and end-stage renal disease on hemodialysis.  Patient presents to the emergency department complaining of groin pain and difficulty with ambulation.  Patient has been admitted for Right hip joint effusion [M25.451]  Patient is known to our practice and receives outpatient dialysis treatments at [facility] on a MWF, supervised by [facility] physicians. Last treatment received on Friday. Patient states he has been having this discomfort weeks. It has progressively worsened to the point where he is having trouble ambulating. Denies recent fall or injury. Denies recent illness. No known fever or chills.   Labs on ED arrival include sodium 130, serum bicarb 20, glucose 201, BUN 24, creatinine 10.26 with GFR 6.  Elevated WBCs 14.4 with hemoglobin 8.4.  Blood cultures pending.  Right hip MRI shows large right hip joint effusion concerning for septic arthritis.  Consult placed for orthopedics.  We have been consulted to manage dialysis needs during this admission.   Objective:  Vital signs in last 24 hours:  Temp:  [98.2 F (36.8 C)-101 F (38.3 C)] 98.4 F (36.9 C) (09/11 0844) Pulse Rate:  [78-93] 78 (09/11 1000) Resp:  [16-25] 25 (09/11 1000) BP: (149-162)/(73-93) 155/93 (09/11 1000) SpO2:  [92 %-99 %] 96 % (09/11 1000) Weight:  [114.7 kg] 114.7 kg (09/11 0844)  Weight change:  Filed Weights   01/12/2022 0844  Weight: 114.7 kg    Intake/Output: No intake/output data recorded.   Intake/Output this shift:  No intake/output data recorded.  Physical Exam: General: NAD, resting comfortably  Head: Normocephalic, atraumatic. Moist oral mucosal membranes  Eyes: Anicteric  Lungs:  Clear to auscultation  Heart: Regular rate and rhythm  Abdomen:  Soft, nontender,   Extremities:  No peripheral edema.  Neurologic:  Nonfocal, moving all four extremities  Skin: No lesions  Access: Rt Permcath, Lt AVF    Basic Metabolic Panel: Recent Labs  Lab 01/18/2022 1821 12/31/2021 0628  NA 130* 131*  K 3.7 3.6  CL 94* 96*  CO2 20* 23  GLUCOSE 201* 196*  BUN 24* 27*  CREATININE 10.26* 11.63*  CALCIUM 9.1 8.3*    Liver Function Tests: Recent Labs  Lab 01/12/2022 1821  AST 13*  ALT 6  ALKPHOS 73  BILITOT 0.9  PROT 8.6*  ALBUMIN 3.2*   No results for input(s): "LIPASE", "AMYLASE" in the last 168 hours. No results for input(s): "AMMONIA" in the last 168 hours.  CBC: Recent Labs  Lab 01/07/2022 1821 01/15/2022 0628  WBC 14.4* 12.5*  NEUTROABS 12.7*  --   HGB 8.4* 6.8*  HCT 29.8* 22.9*  MCV 88.7 84.5  PLT 320 278    Cardiac Enzymes: No results for input(s): "CKTOTAL", "CKMB", "CKMBINDEX", "TROPONINI" in the last 168 hours.  BNP: Invalid input(s): "POCBNP"  CBG: Recent Labs  Lab 01/13/2022 2329 12/31/2021 0751  GLUCAP 199* 169*    Microbiology: Results for orders placed or performed during the hospital encounter of 01/24/2022  Culture, blood (Routine x 2)     Status: None (Preliminary result)   Collection Time: 01/11/2022  6:31 PM   Specimen: Left Antecubital; Blood  Result Value Ref Range Status   Specimen Description LEFT ANTECUBITAL  Final   Special Requests   Final    BOTTLES DRAWN AEROBIC AND ANAEROBIC Blood Culture adequate volume   Culture   Final    NO GROWTH <  12 HOURS Performed at [facility], [address] [place], [place] [address]    Report Status PENDING  Incomplete  Culture, blood (Routine x 2)     Status: None (Preliminary result)   Collection Time: 01/20/2022  6:31 PM   Specimen: BLOOD LEFT FOREARM  Result Value Ref Range Status   Specimen Description BLOOD LEFT FOREARM  Final   Special Requests   Final    BOTTLES DRAWN AEROBIC AND ANAEROBIC Blood Culture adequate volume   Culture   Final    NO GROWTH < 12 HOURS Performed at [facility], [address] [place], [place] [address]    Report Status PENDING  Incomplete  SARS Coronavirus 2 by RT PCR (hospital order, performed in [place] hospital lab) *cepheid single result test* Anterior Nasal Swab     Status: None   Collection Time: 01/16/2022 10:20 PM   Specimen: Anterior Nasal Swab  Result Value Ref Range Status   SARS Coronavirus 2 by RT PCR NEGATIVE NEGATIVE Final    Comment: (NOTE) SARS-CoV-2 target nucleic acids are NOT DETECTED.  The SARS-CoV-2 RNA is generally detectable in upper and lower respiratory specimens during the acute phase of infection. The lowest concentration of SARS-CoV-2 viral copies this assay can detect is 250 copies / mL. A negative result does not preclude SARS-CoV-2 infection and should not be used as the sole basis for treatment or other patient management decisions.  A negative result may occur with improper specimen collection / handling, submission of specimen other than nasopharyngeal swab, presence of viral mutation(s) within the areas targeted by this assay, and inadequate number of viral copies (<250 copies / mL). A negative result must be combined with clinical observations, patient history, and epidemiological information.  Fact Sheet for Patients:   https://www.patel.info/  Fact Sheet for Healthcare Providers: https://hall.com/  This test is not yet approved or  cleared by the [place] FDA and has been authorized for detection and/or diagnosis of SARS-CoV-2 by FDA under an Emergency Use Authorization (EUA).  This EUA will remain in effect (meaning this test can be used) for the duration of the COVID-19 declaration under Section 564(b)(1) of the Act, 21 U.S.C. section 360bbb-3(b)(1), unless the authorization is terminated or revoked sooner.  Performed at [facility], [address] [place], [place] [address]     Coagulation Studies: Recent Labs    01/20/2022 1820  01/08/2022 0628  LABPROT 14.8 15.2  INR 1.2 1.2    Urinalysis: No results for input(s): "COLORURINE", "LABSPEC", "PHURINE", "GLUCOSEU", "HGBUR", "BILIRUBINUR", "KETONESUR", "PROTEINUR", "UROBILINOGEN", "NITRITE", "LEUKOCYTESUR" in the last 72 hours.  Invalid input(s): "APPERANCEUR"    Imaging: MR HIP RIGHT WO CONTRAST  Result Date: 01/15/2022 CLINICAL DATA:  Right hip pain. Pain with movement. Patient has a fever. EXAM: MR OF THE RIGHT HIP WITHOUT CONTRAST TECHNIQUE: Multiplanar, multisequence MR imaging was performed. No intravenous contrast was administered. COMPARISON:  None Available. FINDINGS: Bones: No hip fracture, dislocation or avascular necrosis. Mild marrow edema in the right medial femoral head and acetabulum. No periosteal reaction or bone destruction. No aggressive osseous lesion. Normal sacrum and sacroiliac joints. No SI joint widening or erosive changes. Articular cartilage and labrum Articular cartilage: High-grade partial-thickness cartilage loss of the right femoral head and acetabulum. Labrum: Grossly intact, but evaluation is limited by lack of intraarticular fluid. Joint or bursal effusion Joint effusion: Large right hip joint effusion. No left joint effusion. No SI joint effusion. Bursae:  No bursal fluid. Muscles and  tendons Mild soft tissue edema in the right adductor musculature and right gluteus maximus muscle adjacent to the trochanteric insertion. Other findings No pelvic free fluid. No fluid collection or hematoma. No inguinal lymphadenopathy. No inguinal hernia. IMPRESSION: 1. High-grade partial-thickness cartilage loss of the right femoral head and acetabulum. Mild marrow edema in the right medial femoral head and acetabulum. Large right hip joint effusion. Mild soft tissue edema in the right adductor musculature and right gluteus maximus muscle adjacent to the trochanteric insertion. The overall appearance is concerning for septic arthritis. If there is clinical concern  regarding septic arthritis, recommend arthrocentesis. These results will be called to the ordering clinician or representative by the Radiologist Assistant, and communication documented in the PACS or [employer]. Electronically Signed   By: Kathreen Devoid M.D.   On: 01/24/2022 07:39   DG Chest 2 View  Result Date: 01/21/2022 CLINICAL DATA:  Suspected sepsis. EXAM: CHEST - 2 VIEW COMPARISON:  Chest x-ray 10/26/2021 FINDINGS: Right-sided central venous catheter tip projects over the distal SVC. Left-sided central venous catheter tip projects over the cavoatrial junction. There is a vascular stent in the left axillary region. The lungs are clear. There is no pleural effusion or pneumothorax. The cardiomediastinal silhouette is within normal limits. No acute fractures. IMPRESSION: No active cardiopulmonary disease. Electronically Signed   By: Ronney Asters M.D.   On: 01/15/2022 18:17     Medications:    anticoagulant sodium citrate     ceFEPime (MAXIPIME) IV     vancomycin      sodium chloride   Intravenous Once   carvedilol  25 mg Oral BID   Chlorhexidine Gluconate Cloth  6 each Topical Q0600   cinacalcet  30 mg Oral Q breakfast   insulin aspart  0-15 Units Subcutaneous TID AC & HS   insulin aspart protamine- aspart  15 Units Subcutaneous BID WC   pantoprazole  40 mg Oral Daily   sevelamer carbonate  2.4 g Oral TID WC   vitamin A  20,000 Units Oral Daily   Vitamin E  800 Units Oral Daily   alteplase, anticoagulant sodium citrate, cyclobenzaprine, heparin, lidocaine (PF), lidocaine, lidocaine-prilocaine, morphine injection, pentafluoroprop-tetrafluoroeth  Assessment/ Plan:  [name] is a 46 y.o.  male with past medical conditions including CAD, diabetes, GERD, CHF, hypertension, and end-stage renal disease on hemodialysis.  Patient presents to the emergency department complaining of groin pain and difficulty with ambulation.  Patient has been admitted for Right hip joint  effusion [M25.451]   [facility]/MWF/left aVF/Rt Permcath  End-stage renal disease on hemodialysis.  Will maintain outpatient schedule if possible.  Patient receiving dialysis today, UF goal 1 to 1.5 L as tolerated.  Next treatment scheduled for Wednesday.  2. Anemia of chronic kidney disease Normocytic Lab Results  Component Value Date   HGB 6.8 (L) 01/15/2022    Hemoglobin below desired target.  Patient receives Mircera out patient.  Patient will receive 1 unit blood transfusion during dialysis today.  3. Secondary Hyperparathyroidism: with outpatient labs: PTH 104, phosphorus 5.4, calcium 8.7 on 12/09/2021.   Lab Results  Component Value Date   CALCIUM 8.3 (L) 01/10/2022   CAION 0.87 (LL) 07/11/2021   PHOS 7.2 (H) 08/30/2021  Calcium and phosphorus within acceptable range.  Patient received [facility] outpatient.  Phosphorus in August 5.4.  We will continue to monitor bone minerals during this admission.   4. Diabetes mellitus type II with chronic kidney disease/renal manifestations: insulin dependent. Home  regimen includes Humulin 70/30. Most recent hemoglobin A1c is 10.0 on 08/24/21.   5.  Right hip joint effusion seen on right hip MRI.  Antibiotic therapy ordered by primary team.  Blood cultures pending.  Orthopedic consult obtained.  Patient agreeable to open irrigation and debridement of right hip scheduled for later today.    LOS: 1 Makendra Vigeant 9/11/202310:35 AM

## 2022-01-06 NOTE — Anesthesia Preprocedure Evaluation (Signed)
Anesthesia Evaluation    Airway Mallampati: III       Dental   Pulmonary           Cardiovascular hypertension, + CAD and +CHF    ECG 08/25/21:  Normal sinus rhythm Right axis deviation Low voltage QRS   Neuro/Psych Diabetic retinopathy    GI/Hepatic GERD  ,  Endo/Other  diabetes, Type 2, Insulin DependentObesity   Renal/GU ESRF and DialysisRenal disease     Musculoskeletal   Abdominal   Peds  Hematology   Anesthesia Other Findings   Reproductive/Obstetrics                            Anesthesia Physical Anesthesia Plan  ASA: 3 and emergent  Anesthesia Plan:    Post-op Pain Management:    Induction:   PONV Risk Score and Plan:   Airway Management Planned:   Additional Equipment:   Intra-op Plan:   Post-operative Plan:   Informed Consent:   Plan Discussed with:   Anesthesia Plan Comments:         Anesthesia Quick Evaluation

## 2022-01-06 NOTE — Progress Notes (Signed)
PROGRESS NOTE    [name] [name]  MPN:[id number] DOB: Oct 03, 1975 DOA: 01/23/2022 PCP: Danelle Berry, NP    Brief Narrative:   46 y.o. African-American male with medical history significant for CHF coronary artery disease, type diabetes mellitus, end-stage renal disease on hemodialysis on Monday Wednesday Friday, GERD, hypertension and type 2 diabetes mellitus, presented to the emergency room with a [place] of right hip pain with difficulty with ambulation.  The pain has been significantly worse with movement in the hip and in the groin.  He has been having occasional cough.  No sore throat or rhinorrhea or nasal congestion.  No nausea or vomiting or diarrhea or abdominal pain.  He does not make urine.   ED Course: When he came to the ER temperature was 101 and blood pressure 131/85 with pulse symmetry of 92% on room air and later 99.  CMP revealed a sodium of 130 with chloride of 94 and CO2 20 with glucose of 201, BUN of 24 with a creatinine of 10.26 with anion gap of 16.  Albumin was 3.2 with total protein of 8.6 and CBC showed leukocytosis of 14.4 with neutrophilia and anemia slightly worse than previous levels.  COVID-19 PCR came back negative.   Assessment & Plan:   Principal Problem:   Right hip joint effusion Active Problems:   Hypertension   ESRD on hemodialysis ([facility]) - M, W, F via LUE AV fistula   Coronary artery disease   Type 2 diabetes mellitus with end-stage renal disease ([facility])   Sepsis due to undetermined organism ([facility])  * Right hip joint effusion - The patient has sepsis with temperature of 101 and heart rate of 93 without other clear etiology. - This could be related to septic arthritis. Plan: D/w orthopedics Dr Roland Rack OR today for hip washout Continue IV cefepime and vanco Monitor vitals and fever curve   Hypertension PTA Coreg and Cozaar   Type 2 diabetes mellitus with end-stage renal disease ([facility]) Basal bolus regimen Carb mod diet when advanced    Coronary artery disease ASA on hold Coreg and Cozaar as above   ESRD on hemodialysis ([facility]) - M, W, F via LUE AV fistula HD today 9/11  Acute on chronic anemia of chronic kidney disease Hb 6.8 on 9/11 Transfuse 1 unit PRBC during dialysis Post transfusion h and h Daily CBC   DVT prophylaxis: SCDs Code Status: FULL Family Communication:None Disposition Plan: Status is: Inpatient Remains inpatient appropriate because: Concern for septic arthritis   Level of care: Med-Surg  Consultants:  Orthopedics Nephrology  Procedures:  Right hip I&D and washout 9/11  Antimicrobials: Vancomycin Cefepime   Subjective: Seen and examined.  Reports pain in right hip, worse on active and passive ROM  Objective: Vitals:   01/12/2022 1204 12/28/2021 1222 01/20/2022 1319 01/21/2022 1620  BP: (!) 172/102 (!) 178/99 (!) 176/93 (!) 169/88  Pulse: 80 81 80 84  Resp: (!) 32 12 18 16   Temp:  98.4 F (36.9 C) 98.5 F (36.9 C) 97.8 F (36.6 C)  TempSrc:  Oral Oral Temporal  SpO2: 94% 96% 96% 96%  Weight:   114.7 kg 114.7 kg  Height:   6' (1.829 m) 6' (1.829 m)    Intake/Output Summary (Last 24 hours) at 12/27/2021 1834 Last data filed at 01/18/2022 1544 Gross per 24 hour  Intake 563.83 ml  Output 1500 ml  Net -936.17 ml   Filed Weights   01/04/2022 0844 01/15/2022 1319 01/15/2022 1620  Weight: 114.7 kg 114.7  kg 114.7 kg    Examination:  General exam: NAD Respiratory system: Clear to auscultation. Respiratory effort normal. Cardiovascular system: S1S2, RRR, no murmur Gastrointestinal system: Soft, NT/ND, normal BS Central nervous system: Alert and oriented. No focal neurological deficits. Extremities: Right LE decreased ROM due to pain Skin: No rashes, lesions or ulcers Psychiatry: Judgement and insight appear normal. Mood & affect appropriate.     Data Reviewed: I have personally reviewed following labs and imaging studies  CBC: Recent Labs  Lab 12/30/2021 1821 12/29/2021 0628  01/08/2022 1330  WBC 14.4* 12.5*  --   NEUTROABS 12.7*  --   --   HGB 8.4* 6.8* 7.8*  HCT 29.8* 22.9* 25.8*  MCV 88.7 84.5  --   PLT 320 278  --    Basic Metabolic Panel: Recent Labs  Lab 01/25/2022 1821 01/12/2022 0628  NA 130* 131*  K 3.7 3.6  CL 94* 96*  CO2 20* 23  GLUCOSE 201* 196*  BUN 24* 27*  CREATININE 10.26* 11.63*  CALCIUM 9.1 8.3*   GFR: Estimated Creatinine Clearance: 10.3 mL/min (A) (by C-G formula based on SCr of 11.63 mg/dL (H)). Liver Function Tests: Recent Labs  Lab 01/22/2022 1821  AST 13*  ALT 6  ALKPHOS 73  BILITOT 0.9  PROT 8.6*  ALBUMIN 3.2*   No results for input(s): "LIPASE", "AMYLASE" in the last 168 hours. No results for input(s): "AMMONIA" in the last 168 hours. Coagulation Profile: Recent Labs  Lab 01/02/2022 1820 01/07/2022 0628  INR 1.2 1.2   Cardiac Enzymes: No results for input(s): "CKTOTAL", "CKMB", "CKMBINDEX", "TROPONINI" in the last 168 hours. BNP (last 3 results) No results for input(s): "PROBNP" in the last 8760 hours. HbA1C: Recent Labs    01/10/2022 0628  HGBA1C 8.1*   CBG: Recent Labs  Lab 01/14/2022 2329 01/13/2022 0751 01/19/2022 1317 01/02/2022 1645  GLUCAP 199* 169* 123* 119*   Lipid Profile: No results for input(s): "CHOL", "HDL", "LDLCALC", "TRIG", "CHOLHDL", "LDLDIRECT" in the last 72 hours. Thyroid Function Tests: No results for input(s): "TSH", "T4TOTAL", "FREET4", "T3FREE", "THYROIDAB" in the last 72 hours. Anemia Panel: No results for input(s): "VITAMINB12", "FOLATE", "FERRITIN", "TIBC", "IRON", "RETICCTPCT" in the last 72 hours. Sepsis Labs: Recent Labs  Lab 01/12/2022 1621 12/30/2021 0628  PROCALCITON  --  3.13  LATICACIDVEN 1.3  --     Recent Results (from the past 240 hour(s))  Culture, blood (Routine x 2)     Status: None (Preliminary result)   Collection Time: 01/08/2022  6:31 PM   Specimen: Left Antecubital; Blood  Result Value Ref Range Status   Specimen Description LEFT ANTECUBITAL  Final   Special  Requests   Final    BOTTLES DRAWN AEROBIC AND ANAEROBIC Blood Culture adequate volume   Culture   Final    NO GROWTH < 12 HOURS Performed at [facility], [address], [place], [place] [address]    Report Status PENDING  Incomplete  Culture, blood (Routine x 2)     Status: None (Preliminary result)   Collection Time: 01/18/2022  6:31 PM   Specimen: BLOOD LEFT FOREARM  Result Value Ref Range Status   Specimen Description BLOOD LEFT FOREARM  Final   Special Requests   Final    BOTTLES DRAWN AEROBIC AND ANAEROBIC Blood Culture adequate volume   Culture  Setup Time   Final    GRAM POSITIVE COCCI AEROBIC BOTTLE ONLY Organism ID to follow CRITICAL RESULT CALLED TO, READ BACK BY AND VERIFIED WITH: ALEX  CHAPPELL AT 8119 ON 12/29/2021 BY SS Performed at [facility], [address] [place], [place] [address]    Culture [facility] POSITIVE COCCI  Final   Report Status PENDING  Incomplete  Blood Culture ID Panel (Reflexed)     Status: Abnormal   Collection Time: 01/14/2022  6:31 PM  Result Value Ref Range Status   Enterococcus faecalis NOT DETECTED NOT DETECTED Final   Enterococcus Faecium NOT DETECTED NOT DETECTED Final   Listeria monocytogenes NOT DETECTED NOT DETECTED Final   Staphylococcus species DETECTED (A) NOT DETECTED Final    Comment: CRITICAL RESULT CALLED TO, READ BACK BY AND VERIFIED WITH: ALEX CHAPPELLE AT 9562 ON 01/03/2022 BY SS    Staphylococcus aureus (BCID) NOT DETECTED NOT DETECTED Final   Staphylococcus epidermidis DETECTED (A) NOT DETECTED Final    Comment: CRITICAL RESULT CALLED TO, READ BACK BY AND VERIFIED WITH: ALEX CHAPPELLE AT 1308 ON 01/14/2022 BY SS    Staphylococcus lugdunensis NOT DETECTED NOT DETECTED Final   Streptococcus species NOT DETECTED NOT DETECTED Final   Streptococcus agalactiae NOT DETECTED NOT DETECTED Final   Streptococcus pneumoniae NOT DETECTED NOT DETECTED Final   Streptococcus pyogenes NOT DETECTED NOT DETECTED Final    A.calcoaceticus-baumannii NOT DETECTED NOT DETECTED Final   Bacteroides fragilis NOT DETECTED NOT DETECTED Final   Enterobacterales NOT DETECTED NOT DETECTED Final   Enterobacter cloacae complex NOT DETECTED NOT DETECTED Final   Escherichia coli NOT DETECTED NOT DETECTED Final   Klebsiella aerogenes NOT DETECTED NOT DETECTED Final   Klebsiella oxytoca NOT DETECTED NOT DETECTED Final   Klebsiella pneumoniae NOT DETECTED NOT DETECTED Final   Proteus species NOT DETECTED NOT DETECTED Final   Salmonella species NOT DETECTED NOT DETECTED Final   Serratia marcescens NOT DETECTED NOT DETECTED Final   Haemophilus influenzae NOT DETECTED NOT DETECTED Final   Neisseria meningitidis NOT DETECTED NOT DETECTED Final   Pseudomonas aeruginosa NOT DETECTED NOT DETECTED Final   Stenotrophomonas maltophilia NOT DETECTED NOT DETECTED Final   Candida albicans NOT DETECTED NOT DETECTED Final   Candida auris NOT DETECTED NOT DETECTED Final   Candida glabrata NOT DETECTED NOT DETECTED Final   Candida krusei NOT DETECTED NOT DETECTED Final   Candida parapsilosis NOT DETECTED NOT DETECTED Final   Candida tropicalis NOT DETECTED NOT DETECTED Final   Cryptococcus neoformans/gattii NOT DETECTED NOT DETECTED Final   Methicillin resistance mecA/C NOT DETECTED NOT DETECTED Final    Comment: Performed at [facility], [address] [place], [place] [address]  SARS Coronavirus 2 by RT PCR (hospital order, performed in [facility] hospital lab) *cepheid single result test* Anterior Nasal Swab     Status: None   Collection Time: 01/23/2022 10:20 PM   Specimen: Anterior Nasal Swab  Result Value Ref Range Status   SARS Coronavirus 2 by RT PCR NEGATIVE NEGATIVE Final    Comment: (NOTE) SARS-CoV-2 target nucleic acids are NOT DETECTED.  The SARS-CoV-2 RNA is generally detectable in upper and lower respiratory specimens during the acute phase of infection. The lowest concentration of SARS-CoV-2 viral copies this  assay can detect is 250 copies / mL. A negative result does not preclude SARS-CoV-2 infection and should not be used as the sole basis for treatment or other patient management decisions.  A negative result may occur with improper specimen collection / handling, submission of specimen other than nasopharyngeal swab, presence of viral mutation(s) within the areas targeted by this assay, and inadequate number of viral copies (<250 copies /  mL). A negative result must be combined with clinical observations, patient history, and epidemiological information.  Fact Sheet for Patients:   https://www.patel.info/  Fact Sheet for Healthcare Providers: https://hall.com/  This test is not yet approved or  cleared by the [place] FDA and has been authorized for detection and/or diagnosis of SARS-CoV-2 by FDA under an Emergency Use Authorization (EUA).  This EUA will remain in effect (meaning this test can be used) for the duration of the COVID-19 declaration under Section 564(b)(1) of the Act, 21 U.S.C. section 360bbb-3(b)(1), unless the authorization is terminated or revoked sooner.  Performed at [facility], [address], [place], [place] [address]   Surgical pcr screen     Status: None   Collection Time: 01/23/2022  2:30 PM   Specimen: Nasal Mucosa; Nasal Swab  Result Value Ref Range Status   MRSA, PCR NEGATIVE NEGATIVE Final   Staphylococcus aureus NEGATIVE NEGATIVE Final    Comment: (NOTE) The Xpert SA Assay (FDA approved for NASAL specimens in patients 53 years of age and older), is one component of a comprehensive surveillance program. It is not intended to diagnose infection nor to guide or monitor treatment. Performed at [facility], [address] [place], [place] [address]          Radiology Studies: MR HIP RIGHT WO CONTRAST  Result Date: 01/04/2022 CLINICAL DATA:  Right hip pain. Pain with movement.  Patient has a fever. EXAM: MR OF THE RIGHT HIP WITHOUT CONTRAST TECHNIQUE: Multiplanar, multisequence MR imaging was performed. No intravenous contrast was administered. COMPARISON:  None Available. FINDINGS: Bones: No hip fracture, dislocation or avascular necrosis. Mild marrow edema in the right medial femoral head and acetabulum. No periosteal reaction or bone destruction. No aggressive osseous lesion. Normal sacrum and sacroiliac joints. No SI joint widening or erosive changes. Articular cartilage and labrum Articular cartilage: High-grade partial-thickness cartilage loss of the right femoral head and acetabulum. Labrum: Grossly intact, but evaluation is limited by lack of intraarticular fluid. Joint or bursal effusion Joint effusion: Large right hip joint effusion. No left joint effusion. No SI joint effusion. Bursae:  No bursal fluid. Muscles and tendons Mild soft tissue edema in the right adductor musculature and right gluteus maximus muscle adjacent to the trochanteric insertion. Other findings No pelvic free fluid. No fluid collection or hematoma. No inguinal lymphadenopathy. No inguinal hernia. IMPRESSION: 1. High-grade partial-thickness cartilage loss of the right femoral head and acetabulum. Mild marrow edema in the right medial femoral head and acetabulum. Large right hip joint effusion. Mild soft tissue edema in the right adductor musculature and right gluteus maximus muscle adjacent to the trochanteric insertion. The overall appearance is concerning for septic arthritis. If there is clinical concern regarding septic arthritis, recommend arthrocentesis. These results will be called to the ordering clinician or representative by the Radiologist Assistant, and communication documented in the PACS or [employer]. Electronically Signed   By: Kathreen Devoid M.D.   On: 12/27/2021 07:39   DG Chest 2 View  Result Date: 01/20/2022 CLINICAL DATA:  Suspected sepsis. EXAM: CHEST - 2 VIEW COMPARISON:  Chest  x-ray 10/26/2021 FINDINGS: Right-sided central venous catheter tip projects over the distal SVC. Left-sided central venous catheter tip projects over the cavoatrial junction. There is a vascular stent in the left axillary region. The lungs are clear. There is no pleural effusion or pneumothorax. The cardiomediastinal silhouette is within normal limits. No acute fractures. IMPRESSION: No active cardiopulmonary disease. Electronically Signed   By: Warren Lacy  Dagoberto Reef M.D.   On: 01/16/2022 18:17        Scheduled Meds:  [MAR Hold] carvedilol  25 mg Oral BID   [MAR Hold] Chlorhexidine Gluconate Cloth  6 each Topical Q0600   [MAR Hold] cinacalcet  30 mg Oral Q breakfast   heparin sodium (porcine)       [MAR Hold] insulin aspart  0-15 Units Subcutaneous TID AC & HS   [MAR Hold] insulin aspart protamine- aspart  15 Units Subcutaneous BID WC   [MAR Hold] pantoprazole  40 mg Oral Daily   [MAR Hold] sevelamer carbonate  2.4 g Oral TID WC   [MAR Hold] vitamin A  20,000 Units Oral Daily   [MAR Hold] Vitamin E  800 Units Oral Daily   Continuous Infusions:  [MAR Hold] ceFEPime (MAXIPIME) IV     [MAR Hold] vancomycin Stopped (01/04/2022 1217)     LOS: 1 day       Sidney Ace, MD [facility]   If 7PM-7AM, please contact night-coverage  01/22/2022, 6:34 PM

## 2022-01-06 NOTE — Progress Notes (Signed)
PHARMACY - PHYSICIAN COMMUNICATION CRITICAL VALUE ALERT - BLOOD CULTURE IDENTIFICATION (BCID)  [name] [name] is an 46 y.o. male who presented to [facility] on 01/17/2022 with a chief complaint of right hip joint effusion / probable septic arthritis  Assessment:  1/4 bottles GPC. BCID detected MSSE. This result could reflect contamination versus hematogenous spread related to suspected right hip joint infection.   Name of physician contacted: Dr. Priscella Mann  Current antibiotics: Cefepime + vancomycin  Changes to prescribed antibiotics recommended:  Patient is on recommended antibiotics - No changes needed  Results for orders placed or performed during the hospital encounter of 01/24/2022  Blood Culture ID Panel (Reflexed) (Collected: 01/19/2022  6:31 PM)  Result Value Ref Range   Enterococcus faecalis NOT DETECTED NOT DETECTED   Enterococcus Faecium NOT DETECTED NOT DETECTED   Listeria monocytogenes NOT DETECTED NOT DETECTED   Staphylococcus species DETECTED (A) NOT DETECTED   Staphylococcus aureus (BCID) NOT DETECTED NOT DETECTED   Staphylococcus epidermidis DETECTED (A) NOT DETECTED   Staphylococcus lugdunensis NOT DETECTED NOT DETECTED   Streptococcus species NOT DETECTED NOT DETECTED   Streptococcus agalactiae NOT DETECTED NOT DETECTED   Streptococcus pneumoniae NOT DETECTED NOT DETECTED   Streptococcus pyogenes NOT DETECTED NOT DETECTED   A.calcoaceticus-baumannii NOT DETECTED NOT DETECTED   Bacteroides fragilis NOT DETECTED NOT DETECTED   Enterobacterales NOT DETECTED NOT DETECTED   Enterobacter cloacae complex NOT DETECTED NOT DETECTED   Escherichia coli NOT DETECTED NOT DETECTED   Klebsiella aerogenes NOT DETECTED NOT DETECTED   Klebsiella oxytoca NOT DETECTED NOT DETECTED   Klebsiella pneumoniae NOT DETECTED NOT DETECTED   Proteus species NOT DETECTED NOT DETECTED   Salmonella species NOT DETECTED NOT DETECTED   Serratia marcescens NOT DETECTED NOT DETECTED   Haemophilus  influenzae NOT DETECTED NOT DETECTED   Neisseria meningitidis NOT DETECTED NOT DETECTED   Pseudomonas aeruginosa NOT DETECTED NOT DETECTED   Stenotrophomonas maltophilia NOT DETECTED NOT DETECTED   Candida albicans NOT DETECTED NOT DETECTED   Candida auris NOT DETECTED NOT DETECTED   Candida glabrata NOT DETECTED NOT DETECTED   Candida krusei NOT DETECTED NOT DETECTED   Candida parapsilosis NOT DETECTED NOT DETECTED   Candida tropicalis NOT DETECTED NOT DETECTED   Cryptococcus neoformans/gattii NOT DETECTED NOT DETECTED   Methicillin resistance mecA/C NOT DETECTED NOT DETECTED    Benita Gutter 01/02/2022  4:28 PM

## 2022-01-06 NOTE — Transfer of Care (Signed)
Immediate Anesthesia Transfer of Care Note  Patient: [name] [name]  Procedure(s) Performed: INCISION AND DRAINAGE OF RIGHT HIP (Right: Hip)  Patient Location: PACU  Anesthesia Type:General  Level of Consciousness: sedated and patient cooperative  Airway & Oxygen Therapy: Patient Spontanous Breathing and Patient connected to face mask oxygen  Post-op Assessment: Report given to RN and Post -op Vital signs reviewed and stable  Post vital signs: Reviewed and stable  Last Vitals:  Vitals Value Taken Time  BP    Temp    Pulse    Resp    SpO2      Last Pain:  Vitals:   01/08/2022 1620  TempSrc: Temporal  PainSc: 10-Worst pain ever      Patients Stated Pain Goal: 0 (92/90/90 3014)  Complications: No notable events documented.

## 2022-01-06 NOTE — Assessment & Plan Note (Signed)
-   We will place the patient on supplement coverage with NovoLog. - We will continue basal coverage.

## 2022-01-07 ENCOUNTER — Encounter: Payer: Self-pay | Admitting: Surgery

## 2022-01-07 DIAGNOSIS — M25451 Effusion, right hip: Secondary | ICD-10-CM | POA: Diagnosis not present

## 2022-01-07 LAB — GLUCOSE, CAPILLARY
Glucose-Capillary: 273 mg/dL — ABNORMAL HIGH (ref 70–99)
Glucose-Capillary: 263 mg/dL — ABNORMAL HIGH (ref 70–99)
Glucose-Capillary: 199 mg/dL — ABNORMAL HIGH (ref 70–99)
Glucose-Capillary: 90 mg/dL (ref 70–99)

## 2022-01-07 LAB — HEPATITIS C ANTIBODY: HCV Ab: NONREACTIVE — AB

## 2022-01-07 LAB — CBC
HCT: 25.1 % — ABNORMAL LOW (ref 39.0–52.0)
Hemoglobin: 7.6 g/dL — ABNORMAL LOW (ref 13.0–17.0)
MCH: 25.9 pg — ABNORMAL LOW (ref 26.0–34.0)
MCHC: 30.3 g/dL (ref 30.0–36.0)
MCV: 85.7 fL (ref 80.0–100.0)
Platelets: 259 10*3/uL (ref 150–400)
RBC: 2.93 MIL/uL — ABNORMAL LOW (ref 4.22–5.81)
RDW: 17.1 % — ABNORMAL HIGH (ref 11.5–15.5)
WBC: 14.3 10*3/uL — ABNORMAL HIGH (ref 4.0–10.5)
nRBC: 0 % (ref 0.0–0.2)

## 2022-01-07 LAB — HIV ANTIBODY (ROUTINE TESTING W REFLEX): HIV Screen 4th Generation wRfx: NONREACTIVE

## 2022-01-07 LAB — BASIC METABOLIC PANEL
Anion gap: 15 (ref 5–15)
BUN: 24 mg/dL — ABNORMAL HIGH (ref 6–20)
CO2: 23 mmol/L (ref 22–32)
Calcium: 8.3 mg/dL — ABNORMAL LOW (ref 8.9–10.3)
Chloride: 95 mmol/L — ABNORMAL LOW (ref 98–111)
Creatinine, Ser: 8.27 mg/dL — ABNORMAL HIGH (ref 0.61–1.24)
GFR, Estimated: 7 mL/min — ABNORMAL LOW (ref >60)
Glucose, Bld: 283 mg/dL — ABNORMAL HIGH (ref 70–99)
Potassium: 4.3 mmol/L (ref 3.5–5.1)
Sodium: 133 mmol/L — ABNORMAL LOW (ref 135–145)

## 2022-01-07 LAB — HEPATITIS B SURFACE ANTIBODY, QUANTITATIVE: Hep B S AB Quant (Post): 23.8 m[IU]/mL (ref >9.9)

## 2022-01-07 MED ORDER — ACETAMINOPHEN 325 MG PO TABS
650.0000 mg | ORAL_TABLET | Freq: Four times a day (QID) | ORAL | Status: DC | PRN
  Administered 2022-01-07 – 2022-01-08 (×2): 650 mg via ORAL
  Filled 2022-01-07 (×2): qty 2

## 2022-01-07 MED ORDER — EPOETIN ALFA 4000 UNIT/ML IJ SOLN
4000.0000 [IU] | INTRAMUSCULAR | Status: DC
  Filled 2022-01-07: qty 1

## 2022-01-07 MED ORDER — HEPARIN SODIUM (PORCINE) 5000 UNIT/ML IJ SOLN
5000.0000 [IU] | Freq: Three times a day (TID) | INTRAMUSCULAR | Status: DC
  Administered 2022-01-07 – 2022-01-08 (×4): 5000 [IU] via SUBCUTANEOUS
  Filled 2022-01-07 (×3): qty 1

## 2022-01-07 MED ORDER — HYDROMORPHONE HCL 1 MG/ML IJ SOLN: 0.5000 mg | INTRAMUSCULAR | Status: DC | PRN

## 2022-01-07 MED ORDER — NEOMYCIN-POLYMYXIN B GU 40-200000 IR SOLN
Status: AC
  Filled 2022-01-07: qty 20

## 2022-01-07 MED ORDER — LOSARTAN POTASSIUM 50 MG PO TABS
50.0000 mg | ORAL_TABLET | Freq: Every day | ORAL | Status: DC
  Administered 2022-01-07 – 2022-01-08 (×2): 50 mg via ORAL
  Filled 2022-01-07 (×2): qty 1

## 2022-01-07 MED ORDER — OXYCODONE HCL 5 MG PO TABS
5.0000 mg | ORAL_TABLET | ORAL | Status: DC | PRN
  Administered 2022-01-07 – 2022-01-08 (×3): 5 mg via ORAL
  Filled 2022-01-07 (×3): qty 1

## 2022-01-07 MED ORDER — ASPIRIN 81 MG PO TBEC
81.0000 mg | DELAYED_RELEASE_TABLET | Freq: Every day | ORAL | Status: DC
  Administered 2022-01-07 – 2022-01-08 (×2): 81 mg via ORAL
  Filled 2022-01-07 (×2): qty 1

## 2022-01-07 NOTE — Progress Notes (Signed)
Hemodialysis patient known at [facility] MWF 6:00am. Patient only concerns was how to obtain personal medical records from the clinic. I explained the process on how to request records, also informed clinic of patient's inquiry. No other dialysis concerns were stated.

## 2022-01-07 NOTE — Progress Notes (Signed)
PROGRESS NOTE    [name] [name]  VOJ:[id number] DOB: 1976-01-15 DOA: 01/01/2022 PCP: Danelle Berry, NP    Brief Narrative:   46 y.o. African-American male with medical history significant for CHF coronary artery disease, type diabetes mellitus, end-stage renal disease on hemodialysis on Monday Wednesday Friday, GERD, hypertension and type 2 diabetes mellitus, presented to the emergency room with a [place] of right hip pain with difficulty with ambulation.  The pain has been significantly worse with movement in the hip and in the groin.  He has been having occasional cough.  No sore throat or rhinorrhea or nasal congestion.  No nausea or vomiting or diarrhea or abdominal pain.  He does not make urine.   ED Course: When he came to the ER temperature was 101 and blood pressure 131/85 with pulse symmetry of 92% on room air and later 99.  CMP revealed a sodium of 130 with chloride of 94 and CO2 20 with glucose of 201, BUN of 24 with a creatinine of 10.26 with anion gap of 16.  Albumin was 3.2 with total protein of 8.6 and CBC showed leukocytosis of 14.4 with neutrophilia and anemia slightly worse than previous levels.  COVID-19 PCR came back negative.  9/12: Postoperative day #1 status post open irrigation and debridement of septic right hip.  Tolerated procedure well.  Surgical cultures obtained and sent.  Currently pending   Assessment & Plan:   Principal Problem:   Right hip joint effusion Active Problems:   Hypertension   ESRD on hemodialysis ([place]) - M, W, F via LUE AV fistula   Coronary artery disease   Type 2 diabetes mellitus with end-stage renal disease ([place])   Sepsis due to undetermined organism ([place])  * Right hip joint effusion - The patient has sepsis with temperature of 101 and heart rate of 93 without other clear etiology. - This could be related to septic arthritis. Status post the OR with orthopedics for I&D and washout 9/11 Plan: Pain control DVT  prophylaxis Continue broad-spectrum IV antibiotics vancomycin and cefepime PT and OT Orthopedic follow-up Monitor vitals and fever curve  Hypertension Resume PTA Coreg and Cozaar   Type 2 diabetes mellitus with end-stage renal disease (HCC) Basal bolus regimen Continue carb modified diet   Coronary artery disease Resume aspirin on postoperative day #1 Coreg and Cozaar as above   ESRD on hemodialysis ([place]) - M, W, F via LUE AV fistula HD successfully completed 9/11 Nephrology following for inpatient hemodialysis needs  Acute on chronic anemia of chronic kidney disease Hb 6.8 on 9/11 Transfused 1 unit PRBC during dialysis Hemoglobin stable postoperatively Plan: Daily CBC Transfuse as needed Hb less than 7  DVT prophylaxis: SQ heparin Code Status: FULL Family Communication:None Disposition Plan: Status is: Inpatient Remains inpatient appropriate because: Septic arthritis status post hip I&D and washout.  On IV antibiotics.  May need infectious disease involvement.   Level of care: Med-Surg  Consultants:  Orthopedics Nephrology  Procedures:  Right hip I&D and washout 9/11  Antimicrobials: Vancomycin Cefepime   Subjective: Seen and examined.  Post operative day #1.  Pain well controlled  Objective: Vitals:   01/17/2022 2048 01/08/2022 2230 01/07/22 0141 01/07/22 0700  BP: (!) 154/84 (!) 136/102 (!) 163/98 (!) 170/93  Pulse: 77 80 74 79  Resp: 19 18 17 18   Temp: 98.4 F (36.9 C) 98.5 F (36.9 C) 97.6 F (36.4 C)   TempSrc: Oral     SpO2: 91% 94% 97% 96%  Weight:  Height:        Intake/Output Summary (Last 24 hours) at 01/07/2022 1053 Last data filed at 01/07/2022 0600 Gross per 24 hour  Intake 1383.83 ml  Output 1600 ml  Net -216.17 ml   Filed Weights   12/30/2021 0844 01/02/2022 1319 12/28/2021 1620  Weight: 114.7 kg 114.7 kg 114.7 kg    Examination:  General exam: No acute distress Respiratory system: Clear to auscultation. Respiratory effort  normal. Cardiovascular system: S1S2, RRR, no murmur Gastrointestinal system: Soft, NT/ND, normal BS Central nervous system: Alert and oriented. No focal neurological deficits. Extremities: Right lower extremity decreased ROM.  Surgical site clean dry and intact. Skin: No rashes, lesions or ulcers Psychiatry: Judgement and insight appear normal. Mood & affect appropriate.     Data Reviewed: I have personally reviewed following labs and imaging studies  CBC: Recent Labs  Lab 12/28/2021 1821 01/20/2022 0628 01/12/2022 1330 01/07/22 0425  WBC 14.4* 12.5*  --  14.3*  NEUTROABS 12.7*  --   --   --   HGB 8.4* 6.8* 7.8* 7.6*  HCT 29.8* 22.9* 25.8* 25.1*  MCV 88.7 84.5  --  85.7  PLT 320 278  --  759   Basic Metabolic Panel: Recent Labs  Lab 01/17/2022 1821 01/04/2022 0628 01/07/22 0425  NA 130* 131* 133*  K 3.7 3.6 4.3  CL 94* 96* 95*  CO2 20* 23 23  GLUCOSE 201* 196* 283*  BUN 24* 27* 24*  CREATININE 10.26* 11.63* 8.27*  CALCIUM 9.1 8.3* 8.3*   GFR: Estimated Creatinine Clearance: 14.4 mL/min (A) (by C-G formula based on SCr of 8.27 mg/dL (H)). Liver Function Tests: Recent Labs  Lab 01/08/2022 1821  AST 13*  ALT 6  ALKPHOS 73  BILITOT 0.9  PROT 8.6*  ALBUMIN 3.2*   No results for input(s): "LIPASE", "AMYLASE" in the last 168 hours. No results for input(s): "AMMONIA" in the last 168 hours. Coagulation Profile: Recent Labs  Lab 01/10/2022 1820 01/16/2022 0628  INR 1.2 1.2   Cardiac Enzymes: No results for input(s): "CKTOTAL", "CKMB", "CKMBINDEX", "TROPONINI" in the last 168 hours. BNP (last 3 results) No results for input(s): "PROBNP" in the last 8760 hours. HbA1C: Recent Labs    01/08/2022 0628  HGBA1C 8.1*   CBG: Recent Labs  Lab 12/30/2021 1317 01/01/2022 1645 01/08/2022 1944 01/15/2022 2133 01/07/22 0757  GLUCAP 123* 119* 132* 194* 273*   Lipid Profile: No results for input(s): "CHOL", "HDL", "LDLCALC", "TRIG", "CHOLHDL", "LDLDIRECT" in the last 72 hours. Thyroid  Function Tests: No results for input(s): "TSH", "T4TOTAL", "FREET4", "T3FREE", "THYROIDAB" in the last 72 hours. Anemia Panel: No results for input(s): "VITAMINB12", "FOLATE", "FERRITIN", "TIBC", "IRON", "RETICCTPCT" in the last 72 hours. Sepsis Labs: Recent Labs  Lab 01/21/2022 1621 01/24/2022 0628  PROCALCITON  --  3.13  LATICACIDVEN 1.3  --     Recent Results (from the past 240 hour(s))  Culture, blood (Routine x 2)     Status: None (Preliminary result)   Collection Time: 01/19/2022  6:31 PM   Specimen: Left Antecubital; Blood  Result Value Ref Range Status   Specimen Description LEFT ANTECUBITAL  Final   Special Requests   Final    BOTTLES DRAWN AEROBIC AND ANAEROBIC Blood Culture adequate volume Performed at [facility], [address] [place], [place] [address]    Culture  Setup Time   Final    GRAM POSITIVE COCCI IN BOTH AEROBIC AND ANAEROBIC BOTTLES CRITICAL VALUE NOTED.  VALUE IS CONSISTENT WITH PREVIOUSLY REPORTED  AND CALLED VALUE. DLB    Culture GRAM POSITIVE COCCI  Final   Report Status PENDING  Incomplete  Culture, blood (Routine x 2)     Status: Abnormal (Preliminary result)   Collection Time: 12/31/2021  6:31 PM   Specimen: BLOOD LEFT FOREARM  Result Value Ref Range Status   Specimen Description   Final    BLOOD LEFT FOREARM Performed at [facility], [address], [place], [place] [address]    Special Requests   Final    BOTTLES DRAWN AEROBIC AND ANAEROBIC Blood Culture adequate volume Performed at [facility], [address] [place], [place] [address]    Culture  Setup Time   Final    GRAM POSITIVE COCCI IN BOTH AEROBIC AND ANAEROBIC BOTTLES CRITICAL RESULT CALLED TO, READ BACK BY AND VERIFIED WITH: ALEX CHAPPELL AT 1627 ON 01/12/2022 BY SS    Culture STAPHYLOCOCCUS EPIDERMIDIS (A)  Final   Report Status PENDING  Incomplete  Blood Culture ID Panel (Reflexed)     Status: Abnormal   Collection Time: 12/27/2021  6:31 PM  Result  Value Ref Range Status   Enterococcus faecalis NOT DETECTED NOT DETECTED Final   Enterococcus Faecium NOT DETECTED NOT DETECTED Final   Listeria monocytogenes NOT DETECTED NOT DETECTED Final   Staphylococcus species DETECTED (A) NOT DETECTED Final    Comment: CRITICAL RESULT CALLED TO, READ BACK BY AND VERIFIED WITH: ALEX CHAPPELLE AT 3419 ON 01/11/2022 BY SS    Staphylococcus aureus (BCID) NOT DETECTED NOT DETECTED Final   Staphylococcus epidermidis DETECTED (A) NOT DETECTED Final    Comment: CRITICAL RESULT CALLED TO, READ BACK BY AND VERIFIED WITH: ALEX CHAPPELLE AT 3790 ON 01/16/2022 BY SS    Staphylococcus lugdunensis NOT DETECTED NOT DETECTED Final   Streptococcus species NOT DETECTED NOT DETECTED Final   Streptococcus agalactiae NOT DETECTED NOT DETECTED Final   Streptococcus pneumoniae NOT DETECTED NOT DETECTED Final   Streptococcus pyogenes NOT DETECTED NOT DETECTED Final   A.calcoaceticus-baumannii NOT DETECTED NOT DETECTED Final   Bacteroides fragilis NOT DETECTED NOT DETECTED Final   Enterobacterales NOT DETECTED NOT DETECTED Final   Enterobacter cloacae complex NOT DETECTED NOT DETECTED Final   Escherichia coli NOT DETECTED NOT DETECTED Final   Klebsiella aerogenes NOT DETECTED NOT DETECTED Final   Klebsiella oxytoca NOT DETECTED NOT DETECTED Final   Klebsiella pneumoniae NOT DETECTED NOT DETECTED Final   Proteus species NOT DETECTED NOT DETECTED Final   Salmonella species NOT DETECTED NOT DETECTED Final   Serratia marcescens NOT DETECTED NOT DETECTED Final   Haemophilus influenzae NOT DETECTED NOT DETECTED Final   Neisseria meningitidis NOT DETECTED NOT DETECTED Final   Pseudomonas aeruginosa NOT DETECTED NOT DETECTED Final   Stenotrophomonas maltophilia NOT DETECTED NOT DETECTED Final   Candida albicans NOT DETECTED NOT DETECTED Final   Candida auris NOT DETECTED NOT DETECTED Final   Candida glabrata NOT DETECTED NOT DETECTED Final   Candida krusei NOT DETECTED NOT  DETECTED Final   Candida parapsilosis NOT DETECTED NOT DETECTED Final   Candida tropicalis NOT DETECTED NOT DETECTED Final   Cryptococcus neoformans/gattii NOT DETECTED NOT DETECTED Final   Methicillin resistance mecA/C NOT DETECTED NOT DETECTED Final    Comment: Performed at [facility], [address] [place], [place] [address]  SARS Coronavirus 2 by RT PCR (hospital order, performed in [facility] hospital lab) *cepheid single result test* Anterior Nasal Swab     Status: None   Collection Time: 01/25/2022 10:20 PM  Specimen: Anterior Nasal Swab  Result Value Ref Range Status   SARS Coronavirus 2 by RT PCR NEGATIVE NEGATIVE Final    Comment: (NOTE) SARS-CoV-2 target nucleic acids are NOT DETECTED.  The SARS-CoV-2 RNA is generally detectable in upper and lower respiratory specimens during the acute phase of infection. The lowest concentration of SARS-CoV-2 viral copies this assay can detect is 250 copies / mL. A negative result does not preclude SARS-CoV-2 infection and should not be used as the sole basis for treatment or other patient management decisions.  A negative result may occur with improper specimen collection / handling, submission of specimen other than nasopharyngeal swab, presence of viral mutation(s) within the areas targeted by this assay, and inadequate number of viral copies (<250 copies / mL). A negative result must be combined with clinical observations, patient history, and epidemiological information.  Fact Sheet for Patients:   https://www.patel.info/  Fact Sheet for Healthcare Providers: https://hall.com/  This test is not yet approved or  cleared by the [place] FDA and has been authorized for detection and/or diagnosis of SARS-CoV-2 by FDA under an Emergency Use Authorization (EUA).  This EUA will remain in effect (meaning this test can be used) for the duration of the COVID-19 declaration under  Section 564(b)(1) of the Act, 21 U.S.C. section 360bbb-3(b)(1), unless the authorization is terminated or revoked sooner.  Performed at [facility], [address], [place], [place] [address]   Surgical pcr screen     Status: None   Collection Time: 01/07/2022  2:30 PM   Specimen: Nasal Mucosa; Nasal Swab  Result Value Ref Range Status   MRSA, PCR NEGATIVE NEGATIVE Final   Staphylococcus aureus NEGATIVE NEGATIVE Final    Comment: (NOTE) The Xpert SA Assay (FDA approved for NASAL specimens in patients 66 years of age and older), is one component of a comprehensive surveillance program. It is not intended to diagnose infection nor to guide or monitor treatment. Performed at [facility], [address] [place], [place] [address]   Aerobic/Anaerobic Culture w Gram Stain (surgical/deep wound)     Status: None (Preliminary result)   Collection Time: 01/07/2022  6:45 PM   Specimen: PATH Other; Tissue  Result Value Ref Range Status   Specimen Description   Final    WOUND Performed at [facility], [address] [place], [place] [address]    Special Requests   Final    RIGHT HIP INCISION Performed at [facility], [address] [place], [place] [address]    Gram Stain   Final    MODERATE WBC PRESENT, PREDOMINANTLY PMN NO ORGANISMS SEEN Performed at [facility], [place] [address], [place], [place] [address]    Culture PENDING  Incomplete   Report Status PENDING  Incomplete         Radiology Studies: MR HIP RIGHT WO CONTRAST  Result Date: 01/04/2022 CLINICAL DATA:  Right hip pain. Pain with movement. Patient has a fever. EXAM: MR OF THE RIGHT HIP WITHOUT CONTRAST TECHNIQUE: Multiplanar, multisequence MR imaging was performed. No intravenous contrast was administered. COMPARISON:  None Available. FINDINGS: Bones: No hip fracture, dislocation or avascular necrosis. Mild marrow edema in the right medial femoral head and acetabulum. No  periosteal reaction or bone destruction. No aggressive osseous lesion. Normal sacrum and sacroiliac joints. No SI joint widening or erosive changes. Articular cartilage and labrum Articular cartilage: High-grade partial-thickness cartilage loss of the right femoral head and acetabulum. Labrum: Grossly intact, but evaluation  is limited by lack of intraarticular fluid. Joint or bursal effusion Joint effusion: Large right hip joint effusion. No left joint effusion. No SI joint effusion. Bursae:  No bursal fluid. Muscles and tendons Mild soft tissue edema in the right adductor musculature and right gluteus maximus muscle adjacent to the trochanteric insertion. Other findings No pelvic free fluid. No fluid collection or hematoma. No inguinal lymphadenopathy. No inguinal hernia. IMPRESSION: 1. High-grade partial-thickness cartilage loss of the right femoral head and acetabulum. Mild marrow edema in the right medial femoral head and acetabulum. Large right hip joint effusion. Mild soft tissue edema in the right adductor musculature and right gluteus maximus muscle adjacent to the trochanteric insertion. The overall appearance is concerning for septic arthritis. If there is clinical concern regarding septic arthritis, recommend arthrocentesis. These results will be called to the ordering clinician or representative by the Radiologist Assistant, and communication documented in the PACS or [employer]. Electronically Signed   By: Kathreen Devoid M.D.   On: 01/15/2022 07:39   DG Chest 2 View  Result Date: 12/31/2021 CLINICAL DATA:  Suspected sepsis. EXAM: CHEST - 2 VIEW COMPARISON:  Chest x-ray 10/26/2021 FINDINGS: Right-sided central venous catheter tip projects over the distal SVC. Left-sided central venous catheter tip projects over the cavoatrial junction. There is a vascular stent in the left axillary region. The lungs are clear. There is no pleural effusion or pneumothorax. The cardiomediastinal silhouette is  within normal limits. No acute fractures. IMPRESSION: No active cardiopulmonary disease. Electronically Signed   By: Ronney Asters M.D.   On: 12/29/2021 18:17        Scheduled Meds:  carvedilol  25 mg Oral BID   Chlorhexidine Gluconate Cloth  6 each Topical Q0600   cinacalcet  30 mg Oral Q breakfast   docusate sodium  100 mg Oral BID   insulin aspart  0-15 Units Subcutaneous TID AC & HS   insulin aspart protamine- aspart  15 Units Subcutaneous BID WC   losartan  50 mg Oral Daily   pantoprazole  40 mg Oral Daily   sevelamer carbonate  2.4 g Oral TID WC   vitamin A  20,000 Units Oral Daily   Vitamin E  800 Units Oral Daily   Continuous Infusions:  ceFEPime (MAXIPIME) IV 1 g (01/02/2022 2221)   vancomycin Stopped (12/30/2021 1217)     LOS: 2 days       Sidney Ace, MD [facility]   If 7PM-7AM, please contact night-coverage  01/07/2022, 10:53 AM

## 2022-01-07 NOTE — Evaluation (Signed)
Occupational Therapy Evaluation Patient Details Name: [name] [name] MRN: [id number] DOB: 07-06-1975 Today's Date: 01/07/2022   History of Present Illness [name] [name] is a 70yoM who comes to [facility] on 9/10 c Right hip pain and difficult walking. Right hip MRI revealed hip effusion, mildly increased signal in the gluteus medius and minimus tendons adjacent to the joint. PMH: CHF, CAD, DM2, ESRD on HD MWF, GERD, HTN. Pt taken to OR by Dr. Roland Rack  on 12/27/2021 for open irrigation and debridement of septic right hip.   Clinical Impression   [name] [name] was seen for OT evaluation this date. Prior to hospital admission, pt required intermittent assistance for ADL/IADL management and has had multiple recent hospital stays in the last 6 months. Pt lives alone in a 1 level apartment home with his mother available to provide PRN assistance and support with IADL management such as grocery shopping, cooking, etc.. Pt presents to acute OT demonstrating impaired ADL performance and functional mobility 2/2 decreased activity tolerance, decreased strength, and increased pain in his RLE (See OT problem list). Pt currently requires MOD A for LB ADL management and functional mobility using a RW.  Pt would benefit from skilled OT services to address noted impairments and functional limitations (see below for any additional details) in order to maximize safety and independence while minimizing falls risk and caregiver burden. Upon hospital discharge, recommend STR to maximize pt safety and return to PLOF.        Recommendations for follow up therapy are one component of a multi-disciplinary discharge planning process, led by the attending physician.  Recommendations may be updated based on patient status, additional functional criteria and insurance authorization.   Follow Up Recommendations  Skilled nursing-short term rehab (<3 hours/day)    Assistance Recommended at Discharge Frequent or constant Supervision/Assistance   Patient can return home with the following A lot of help with bathing/dressing/bathroom;A lot of help with walking and/or transfers;Help with stairs or ramp for entrance;Assist for transportation;Assistance with cooking/housework    Functional Status Assessment  Patient has had a recent decline in their functional status and demonstrates the ability to make significant improvements in function in a reasonable and predictable amount of time.  Equipment Recommendations  BSC/3in1    Recommendations for Other Services       Precautions / Restrictions Precautions Precautions: Fall Restrictions Weight Bearing Restrictions: No      Mobility Bed Mobility               General bed mobility comments: Deferred. Pt in recliner at start/end of session.    Transfers Overall transfer level: Needs assistance Equipment used: Rolling walker (2 wheels) Transfers: Sit to/from Stand Sit to Stand: Min guard, Min assist           General transfer comment: Cueing for hand/foot placement.      Balance Overall balance assessment: Needs assistance Sitting-balance support: Feet supported, No upper extremity supported Sitting balance-[name] [name] Scale: Good     Standing balance support: During functional activity, Bilateral upper extremity supported Standing balance-[name] [name] Scale: Poor                             ADL either performed or assessed with clinical judgement   ADL Overall ADL's : Needs assistance/impaired  General ADL Comments: Pt is functionally limited by generalized weakness, decreased activity tolerance, and increased pain in his RLE. He requires MOD A to don R hospital sock, is able to adjust L sock independently. MIN A for STS. Functional mobility deferred 2/2 increased dizziness with standing attmept.     Vision         Perception     Praxis      Pertinent Vitals/Pain Pain Assessment Pain  Assessment: 0-10 Pain Score: 5  Pain Location: R hip with mobility Pain Descriptors / Indicators: Aching, Sore Pain Intervention(s): Limited activity within patient's tolerance, Monitored during session, Repositioned     Hand Dominance     Extremity/Trunk Assessment Upper Extremity Assessment Upper Extremity Assessment: Generalized weakness   Lower Extremity Assessment Lower Extremity Assessment: Generalized weakness       Communication Communication Communication: No difficulties   Cognition Arousal/Alertness: Awake/alert Behavior During Therapy: WFL for tasks assessed/performed Overall Cognitive Status: Within Functional Limits for tasks assessed                                 General Comments: A&O to person, place, and situation.     General Comments  Pt recieved on 2L [place], spO2 96%. Pt elects to remove O2 for mobility attempts. Noted to desat to 88-89% with STS. 2L [place] replaced and pt SpO2 rebounds to 96% at end of session.    Exercises Other Exercises Other Exercises: Pt/family member (mother) educated on role of OT in acute setting, safe use of AE/DME for ADL management, falls prevention strategies, DC recs, & routines modifications to support safety and functional independence upon hospital DC.   Shoulder Instructions      Home Living Family/patient expects to be discharged to:: Private residence Living Arrangements: Alone Available Help at Discharge: Family;Available PRN/intermittently (aid in home 4 days/week; mother) Type of Home: Apartment Home Access: Stairs to enter Entrance Stairs-Number of Steps: 1 step, 1st fl apt   Home Layout: One level     Bathroom Shower/Tub: Teacher, early years/pre: Standard     Home Equipment: Conservation officer, nature (2 wheels)          Prior Functioning/Environment Prior Level of Function : Independent/Modified Independent             Mobility Comments: falls frequently, has dizzy spells, worse  after HD sessions. Has RW from past admission but states it "feels wobbly". Is interested in rollator, esp for HD days. ADLs Comments: Pt reports he is generally independent, however, he has had a recent decline in his functional indep with multiple hospital stays in last 6 months.        OT Problem List: Decreased strength;Decreased coordination;Decreased activity tolerance;Decreased safety awareness;Impaired balance (sitting and/or standing);Decreased knowledge of use of DME or AE;Pain;Decreased range of motion      OT Treatment/Interventions: Self-care/ADL training;Therapeutic exercise;Therapeutic activities;DME and/or AE instruction;Patient/family education;Balance training;Energy conservation    OT Goals(Current goals can be found in the care plan section) Acute Rehab OT Goals Patient Stated Goal: To walk more OT Goal Formulation: With patient Time For Goal Achievement: 01/21/22 Potential to Achieve Goals: Good ADL Goals Pt Will Perform Lower Body Dressing: with adaptive equipment;sit to/from stand;with set-up;with supervision Pt Will Transfer to Toilet: bedside commode;with supervision;with set-up;ambulating Pt Will Perform Toileting - Clothing Manipulation and hygiene: sit to/from stand;with supervision;with set-up;with adaptive equipment  OT Frequency: Min 2X/week    Co-evaluation  AM-PAC OT "6 Clicks" Daily Activity     Outcome Measure Help from another person eating meals?: None Help from another person taking care of personal grooming?: A Little Help from another person toileting, which includes using toliet, bedpan, or urinal?: A Lot Help from another person bathing (including washing, rinsing, drying)?: A Lot Help from another person to put on and taking off regular upper body clothing?: A Little Help from another person to put on and taking off regular lower body clothing?: A Lot 6 Click Score: 16   End of Session Equipment Utilized During Treatment:  Gait belt;Rolling walker (2 wheels) Nurse Communication: Mobility status  Activity Tolerance: Patient tolerated treatment well Patient left: in chair;with call bell/phone within reach;with family/visitor present;with chair alarm set  OT Visit Diagnosis: Other abnormalities of gait and mobility (R26.89);Pain Pain - Right/Left: Right Pain - part of body: Hip                Time: 4199-1444 OT Time Calculation (min): 39 min Charges:  OT General Charges $OT Visit: 1 Visit OT Evaluation $OT Eval Moderate Complexity: 1 Mod OT Treatments $Self Care/Home Management : 23-37 mins  Shara Blazing, M.S., OTR/L Ascom: [phone] 01/07/22, 3:23 PM

## 2022-01-07 NOTE — Progress Notes (Signed)
Subjective: 1 Day Post-Op Procedure(s) (LRB): INCISION AND DRAINAGE OF RIGHT HIP (Right) Patient reports pain as moderate.   Patient is well this afternoon, he has dialysis on M,W,F. Scheduled for dialysis tomorrow. PT and care management to assist with discharge planning. Negative for chest pain and shortness of breath Fever: no Gastrointestinal:Negative for nausea and vomiting this afternoon.  Did report some nausea this morning.  Objective: Vital signs in last 24 hours: Temp:  [97.6 F (36.4 C)-98.5 F (36.9 C)] 98.1 F (36.7 C) (09/12 1545) Pulse Rate:  [74-83] 78 (09/12 1545) Resp:  [10-23] 16 (09/12 1545) BP: (106-170)/(79-102) 106/79 (09/12 1545) SpO2:  [88 %-100 %] 100 % (09/12 1545)  Intake/Output from previous day:  Intake/Output Summary (Last 24 hours) at 01/07/2022 1653 Last data filed at 01/07/2022 0600 Gross per 24 hour  Intake 820 ml  Output 100 ml  Net 720 ml    Intake/Output this shift: No intake/output data recorded.  Labs: Recent Labs    01/23/2022 1821 01/10/2022 0628 01/13/2022 1330 01/07/22 0425  HGB 8.4* 6.8* 7.8* 7.6*   Recent Labs    01/11/2022 0628 01/16/2022 1330 01/07/22 0425  WBC 12.5*  --  14.3*  RBC 2.71*  --  2.93*  HCT 22.9* 25.8* 25.1*  PLT 278  --  259   Recent Labs    01/25/2022 0628 01/07/22 0425  NA 131* 133*  K 3.6 4.3  CL 96* 95*  CO2 23 23  BUN 27* 24*  CREATININE 11.63* 8.27*  GLUCOSE 196* 283*  CALCIUM 8.3* 8.3*   Recent Labs    12/27/2021 1820 01/17/2022 0628  INR 1.2 1.2     EXAM General - Patient is Alert, Appropriate, and Oriented Extremity - ABD soft Neurovascular intact Dorsiflexion/Plantar flexion intact Incision: dressing C/D/I No cellulitis present Dressing/Incision - clean, dry, no drainage noted to the right hip honeycomb dressing Hemovac still intact to the right hip, moderate bloody drainage.  No purulent material is noted. Motor Function - intact, moving foot and toes well on exam.  Abdomen  soft with intact bowel sounds.  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   CHF (congestive heart failure) (H[facility])    Coronary artery disease    Diabetes mellitus without complication ([place])    Dialysis patient [facility])    M-W-F   GERD (gastroesophageal reflux disease)    Hypertension    Kyrle's disease    Renal disorder    on dialysis for 4 years three times a week last was Tuesday   Secondary hyperparathyroidism of renal origin ([place])    Shortness of breath dyspnea    Type 2 diabetes mellitus with diabetic nephropathy (H[facility])    Type 2 diabetes mellitus with diabetic retinopathy (H[facility])     Assessment/Plan: 1 Day Post-Op Procedure(s) (LRB): INCISION AND DRAINAGE OF RIGHT HIP (Right) Principal Problem:   Right hip joint effusion Active Problems:   ESRD on hemodialysis ([place]) - M, W, F via LUE AV fistula   Hypertension   Coronary artery disease   Type 2 diabetes mellitus with end-stage renal disease ([place])   Sepsis due to undetermined organism ([place])  Estimated body mass index is 34.29 kg/m as calculated from the following:   Height as of this encounter: 6' (1.829 m).   Weight as of this encounter: 114.7 kg. Advance diet Up with therapy D/C IV fluids when tolerating po intake.  Labs reviewed today. Hg 7.6 today, will recheck hemoglobin tomorrow morning.  Scheduled  dialysis tomorrow. Moderate WBC on gram stain, cultures still pending at this time. Continue with Cefepime and Vancomycin at this time. Will plan on removing hemovac drain tomorrow morning.  DVT Prophylaxis -  heparin Weight-Bearing as tolerated to right leg  J. Cameron Proud, PA-C [facility] 01/07/2022, 4:53 PM

## 2022-01-07 NOTE — Evaluation (Signed)
Physical Therapy Evaluation Patient Details Name: [name] [name] MRN: [id number] DOB: 28-Sep-1975 Today's Date: 01/07/2022  History of Present Illness  Malek Skog is a 16yoM who comes to [facility] on 9/10 c Right hip pain and difficult walking. Right hip MRI revealed hip effusion, mildly increased signal in the gluteus medius and minimus tendons adjacent to the joint. PMH: CHF, CAD, DM2, ESRD on HD MWF, GERD, HTN. Pt taken to OR by Dr. Roland Rack  on 12/27/2021 for open irrigation and debridement of septic right hip.  Clinical Impression  Pt admitted with above Dx. Pt has functional limitations due to deficits below (see "PT Problem List"). Pt able to provide details on baseline functional status. Today pt requires considerable physical assistance to perform bed mobility with NSG earlier in day. Performance of transfers are labored and limited by wooziness and weakness. Dizziness persists in standing and pt does not feel safe attempting steps given his recent history of frequent falls and dizziness. Pt is noted to be 88-89% Sp02 on room air, but improving sats to WNL on 2-3L. BP is WNL before and after standing. Patient's performance this date reveals decreased ability, independence, and tolerance in performing all basic mobility required for performance of activities of daily living. Pt requires additional DME, close physical assistance, and cues for safe participate in mobility. Pt will benefit from skilled PT intervention to increase independence and safety with basic mobility in preparation for discharge to the venue listed below.     No data found.       Recommendations for follow up therapy are one component of a multi-disciplinary discharge planning process, led by the attending physician.  Recommendations may be updated based on patient status, additional functional criteria and insurance authorization.  Follow Up Recommendations Skilled nursing-short term rehab (<3 hours/day) Can patient physically  be transported by private vehicle: No    Assistance Recommended at Discharge Intermittent Supervision/Assistance  Patient can return home with the following  A lot of help with walking and/or transfers;Assistance with cooking/housework;Assist for transportation;Help with stairs or ramp for entrance    Equipment Recommendations Rolling walker (2 wheels)  Recommendations for Other Services       Functional Status Assessment Patient has had a recent decline in their functional status and demonstrates the ability to make significant improvements in function in a reasonable and predictable amount of time.     Precautions / Restrictions Precautions Precautions: Fall Restrictions Weight Bearing Restrictions: No      Mobility  Bed Mobility               General bed mobility comments: none performed    Transfers Overall transfer level: Needs assistance Equipment used: Rolling walker (2 wheels) Transfers: Sit to/from Stand Sit to Stand: Supervision                Ambulation/Gait Ambulation/Gait assistance:  (pt feels unsafe to attempt due to significant dizziness)                Stairs            Wheelchair Mobility    Modified Rankin (Stroke Patients Only)       Balance                                             Pertinent Vitals/Pain Pain Assessment Pain Assessment: 0-10 Pain Score: 10-Worst pain ever  Pain Location: Rt posterior hip Pain Descriptors / Indicators: Aching Pain Intervention(s): Limited activity within patient's tolerance, Monitored during session, Premedicated before session    Home Living Family/patient expects to be discharged to:: Private residence Living Arrangements: Alone Available Help at Discharge: Family;Available PRN/intermittently (aid in home 4 days/week; mother) Type of Home: Apartment Home Access: Stairs to enter   Entrance Stairs-Number of Steps: 1 step, 1st fl apt   Home Layout: One  level Home Equipment: Conservation officer, nature (2 wheels)      Prior Function Prior Level of Function : Independent/Modified Independent             Mobility Comments: falls frequently, has dizzy spells, worse after HD sessions.       Hand Dominance        Extremity/Trunk Assessment   Upper Extremity Assessment Upper Extremity Assessment: Generalized weakness    Lower Extremity Assessment Lower Extremity Assessment: Generalized weakness       Communication      Cognition Arousal/Alertness: Suspect due to medications   Overall Cognitive Status: Within Functional Limits for tasks assessed                                          General Comments      Exercises     Assessment/Plan    PT Assessment Patient needs continued PT services  PT Problem List Decreased strength;Decreased range of motion;Decreased activity tolerance;Decreased balance;Decreased mobility;Decreased knowledge of use of DME;Decreased safety awareness;Decreased cognition;Decreased knowledge of precautions       PT Treatment Interventions DME instruction;Gait training;Balance training;Stair training;Functional mobility training;Therapeutic activities;Therapeutic exercise;Neuromuscular re-education;Patient/family education    PT Goals (Current goals can be found in the Care Plan section)  Acute Rehab PT Goals Patient Stated Goal: regain strength, no be dizzy frequency PT Goal Formulation: With patient Time For Goal Achievement: 01/21/22 Potential to Achieve Goals: Fair    Frequency 7X/week     Co-evaluation               AM-PAC PT "6 Clicks" Mobility  Outcome Measure Help needed turning from your back to your side while in a flat bed without using bedrails?: A Lot Help needed moving from lying on your back to sitting on the side of a flat bed without using bedrails?: A Lot Help needed moving to and from a bed to a chair (including a wheelchair)?: A Little Help needed  standing up from a chair using your arms (e.g., wheelchair or bedside chair)?: A Little Help needed to walk in hospital room?: A Lot Help needed climbing 3-5 steps with a railing? : A Lot 6 Click Score: 14    End of Session Equipment Utilized During Treatment: Oxygen Activity Tolerance: Treatment limited secondary to medical complications (Comment) Patient left: in chair;with family/visitor present;with call bell/phone within reach;with chair alarm set Nurse Communication: Mobility status PT Visit Diagnosis: Difficulty in walking, not elsewhere classified (R26.2);Other abnormalities of gait and mobility (R26.89);Unsteadiness on feet (R26.81);Repeated falls (R29.6);Muscle weakness (generalized) (M62.81);History of falling (Z91.81);Dizziness and giddiness (R42)    Time: 2725-3664 PT Time Calculation (min) (ACUTE ONLY): 31 min   Charges:   PT Evaluation $PT Eval Moderate Complexity: 1 Mod PT Treatments $Therapeutic Activity: 8-22 mins       12:19 PM, 01/07/22 Etta Grandchild, PT, DPT Physical Therapist - [facility]  [phone] [facility])    [place]  C 01/07/2022, 12:17 PM

## 2022-01-07 NOTE — Progress Notes (Addendum)
[facility]  ROUNDING NOTE   Subjective:   [name] [name] is a 46 year old male with past medical conditions including CAD, diabetes, GERD, CHF, hypertension, and end-stage renal disease on hemodialysis.  Patient presents to the emergency department complaining of groin pain and difficulty with ambulation.  Patient has been admitted for Right hip joint effusion [M25.451]  Patient is known to our practice and receives outpatient dialysis treatments at [facility] on a MWF, supervised [facility].  Patient seen sitting at side of bed with NAs Alert and oriented Patient seen later while sitting in chair Pain well managed   Objective:  Vital signs in last 24 hours:  Temp:  [97.6 F (36.4 C)-98.5 F (36.9 C)] 97.6 F (36.4 C) (09/12 0141) Pulse Rate:  [74-84] 79 (09/12 0700) Resp:  [10-23] 18 (09/12 0700) BP: (136-170)/(84-102) 170/93 (09/12 0700) SpO2:  [88 %-98 %] 88 % (09/12 1100) Weight:  [114.7 kg] 114.7 kg (09/11 1620)  Weight change:  Filed Weights   01/17/2022 0844 12/27/2021 1319 01/12/2022 1620  Weight: 114.7 kg 114.7 kg 114.7 kg    Intake/Output: I/O last 3 completed shifts: In: 1383.8 [I.V.:700; Blood:163.8; Other:20; IV [id number]:440] Out: 1600 [Other:1500; Blood:100]   Intake/Output this shift:  No intake/output data recorded.  Physical Exam: General: NAD, resting comfortably  Head: Normocephalic, atraumatic. Moist oral mucosal membranes  Eyes: Anicteric  Lungs:  Clear to auscultation  Heart: Regular rate and rhythm  Abdomen:  Soft, nontender  Extremities:  No peripheral edema.  Neurologic: Nonfocal, moving all four extremities  Skin: No lesions, Rt hip dressing  Access: Rt Permcath, Lt AVF    Basic Metabolic Panel: Recent Labs  Lab 01/11/2022 1821 01/07/2022 0628 01/07/22 0425  NA 130* 131* 133*  K 3.7 3.6 4.3  CL 94* 96* 95*  CO2 20* 23 23  GLUCOSE 201* 196* 283*  BUN 24* 27* 24*  CREATININE 10.26* 11.63* 8.27*  CALCIUM  9.1 8.3* 8.3*     Liver Function Tests: Recent Labs  Lab 01/04/2022 1821  AST 13*  ALT 6  ALKPHOS 73  BILITOT 0.9  PROT 8.6*  ALBUMIN 3.2*    No results for input(s): "LIPASE", "AMYLASE" in the last 168 hours. No results for input(s): "AMMONIA" in the last 168 hours.  CBC: Recent Labs  Lab 01/24/2022 1821 01/25/2022 0628 12/29/2021 1330 01/07/22 0425  WBC 14.4* 12.5*  --  14.3*  NEUTROABS 12.7*  --   --   --   HGB 8.4* 6.8* 7.8* 7.6*  HCT 29.8* 22.9* 25.8* 25.1*  MCV 88.7 84.5  --  85.7  PLT 320 278  --  259     Cardiac Enzymes: No results for input(s): "CKTOTAL", "CKMB", "CKMBINDEX", "TROPONINI" in the last 168 hours.  BNP: Invalid input(s): "POCBNP"  CBG: Recent Labs  Lab 01/21/2022 1645 12/27/2021 1944 01/01/2022 2133 01/07/22 0757 01/07/22 1126  GLUCAP 119* 132* 194* 273* 58*     Microbiology: Results for orders placed or performed during the hospital encounter of 01/03/2022  Culture, blood (Routine x 2)     Status: None (Preliminary result)   Collection Time: 01/23/2022  6:31 PM   Specimen: Left Antecubital; Blood  Result Value Ref Range Status   Specimen Description LEFT ANTECUBITAL  Final   Special Requests   Final    BOTTLES DRAWN AEROBIC AND ANAEROBIC Blood Culture adequate volume Performed at [facility], [address] [place], [place] [address]    Culture  Setup Time  Final    GRAM POSITIVE COCCI IN BOTH AEROBIC AND ANAEROBIC BOTTLES CRITICAL VALUE NOTED.  VALUE IS CONSISTENT WITH PREVIOUSLY REPORTED AND CALLED VALUE. DLB    Culture GRAM POSITIVE COCCI  Final   Report Status PENDING  Incomplete  Culture, blood (Routine x 2)     Status: Abnormal (Preliminary result)   Collection Time: 01/08/2022  6:31 PM   Specimen: BLOOD LEFT FOREARM  Result Value Ref Range Status   Specimen Description   Final    BLOOD LEFT FOREARM Performed at [facility], [address] [place], [place] [address]    Special Requests   Final     BOTTLES DRAWN AEROBIC AND ANAEROBIC Blood Culture adequate volume Performed at [facility], [address] [place], [place] [address]    Culture  Setup Time   Final    GRAM POSITIVE COCCI IN BOTH AEROBIC AND ANAEROBIC BOTTLES CRITICAL RESULT CALLED TO, READ BACK BY AND VERIFIED WITH: ALEX CHAPPELL AT 1627 ON 01/17/2022 BY SS    Culture STAPHYLOCOCCUS EPIDERMIDIS (A)  Final   Report Status PENDING  Incomplete  Blood Culture ID Panel (Reflexed)     Status: Abnormal   Collection Time: 01/22/2022  6:31 PM  Result Value Ref Range Status   Enterococcus faecalis NOT DETECTED NOT DETECTED Final   Enterococcus Faecium NOT DETECTED NOT DETECTED Final   Listeria monocytogenes NOT DETECTED NOT DETECTED Final   Staphylococcus species DETECTED (A) NOT DETECTED Final    Comment: CRITICAL RESULT CALLED TO, READ BACK BY AND VERIFIED WITH: ALEX CHAPPELLE AT 3536 ON 01/13/2022 BY SS    Staphylococcus aureus (BCID) NOT DETECTED NOT DETECTED Final   Staphylococcus epidermidis DETECTED (A) NOT DETECTED Final    Comment: CRITICAL RESULT CALLED TO, READ BACK BY AND VERIFIED WITH: ALEX CHAPPELLE AT 1627 ON 12/27/2021 BY SS    Staphylococcus lugdunensis NOT DETECTED NOT DETECTED Final   Streptococcus species NOT DETECTED NOT DETECTED Final   Streptococcus agalactiae NOT DETECTED NOT DETECTED Final   Streptococcus pneumoniae NOT DETECTED NOT DETECTED Final   Streptococcus pyogenes NOT DETECTED NOT DETECTED Final   A.calcoaceticus-baumannii NOT DETECTED NOT DETECTED Final   Bacteroides fragilis NOT DETECTED NOT DETECTED Final   Enterobacterales NOT DETECTED NOT DETECTED Final   Enterobacter cloacae complex NOT DETECTED NOT DETECTED Final   Escherichia coli NOT DETECTED NOT DETECTED Final   Klebsiella aerogenes NOT DETECTED NOT DETECTED Final   Klebsiella oxytoca NOT DETECTED NOT DETECTED Final   Klebsiella pneumoniae NOT DETECTED NOT DETECTED Final   Proteus species NOT DETECTED NOT DETECTED Final    Salmonella species NOT DETECTED NOT DETECTED Final   Serratia marcescens NOT DETECTED NOT DETECTED Final   Haemophilus influenzae NOT DETECTED NOT DETECTED Final   Neisseria meningitidis NOT DETECTED NOT DETECTED Final   Pseudomonas aeruginosa NOT DETECTED NOT DETECTED Final   Stenotrophomonas maltophilia NOT DETECTED NOT DETECTED Final   Candida albicans NOT DETECTED NOT DETECTED Final   Candida auris NOT DETECTED NOT DETECTED Final   Candida glabrata NOT DETECTED NOT DETECTED Final   Candida krusei NOT DETECTED NOT DETECTED Final   Candida parapsilosis NOT DETECTED NOT DETECTED Final   Candida tropicalis NOT DETECTED NOT DETECTED Final   Cryptococcus neoformans/gattii NOT DETECTED NOT DETECTED Final   Methicillin resistance mecA/C NOT DETECTED NOT DETECTED Final    Comment: Performed at [facility], [address] [place], [place] [address]  SARS Coronavirus 2 by RT PCR (hospital order, performed in [place]  hospital lab) *cepheid single result test* Anterior Nasal Swab     Status: None   Collection Time: 12/31/2021 10:20 PM   Specimen: Anterior Nasal Swab  Result Value Ref Range Status   SARS Coronavirus 2 by RT PCR NEGATIVE NEGATIVE Final    Comment: (NOTE) SARS-CoV-2 target nucleic acids are NOT DETECTED.  The SARS-CoV-2 RNA is generally detectable in upper and lower respiratory specimens during the acute phase of infection. The lowest concentration of SARS-CoV-2 viral copies this assay can detect is 250 copies / mL. A negative result does not preclude SARS-CoV-2 infection and should not be used as the sole basis for treatment or other patient management decisions.  A negative result may occur with improper specimen collection / handling, submission of specimen other than nasopharyngeal swab, presence of viral mutation(s) within the areas targeted by this assay, and inadequate number of viral copies (<250 copies / mL). A negative result must be combined with  clinical observations, patient history, and epidemiological information.  Fact Sheet for Patients:   https://www.patel.info/  Fact Sheet for Healthcare Providers: https://hall.com/  This test is not yet approved or  cleared by the [place] FDA and has been authorized for detection and/or diagnosis of SARS-CoV-2 by FDA under an Emergency Use Authorization (EUA).  This EUA will remain in effect (meaning this test can be used) for the duration of the COVID-19 declaration under Section 564(b)(1) of the Act, 21 U.S.C. section 360bbb-3(b)(1), unless the authorization is terminated or revoked sooner.  Performed at [facility], [address] [place], [place] [address]   Surgical pcr screen     Status: None   Collection Time: 01/14/2022  2:30 PM   Specimen: Nasal Mucosa; Nasal Swab  Result Value Ref Range Status   MRSA, PCR NEGATIVE NEGATIVE Final   Staphylococcus aureus NEGATIVE NEGATIVE Final    Comment: (NOTE) The Xpert SA Assay (FDA approved for NASAL specimens in patients 65 years of age and older), is one component of a comprehensive surveillance program. It is not intended to diagnose infection nor to guide or monitor treatment. Performed at [facility], [address], [place], [place] [address]   Aerobic/Anaerobic Culture w Gram Stain (surgical/deep wound)     Status: None (Preliminary result)   Collection Time: 01/18/2022  6:45 PM   Specimen: PATH Other; Tissue  Result Value Ref Range Status   Specimen Description   Final    WOUND Performed at [facility], [address], [place], [place] [address]    Special Requests   Final    RIGHT HIP INCISION Performed at [facility], [address] [place], [place] [address]    Gram Stain   Final    MODERATE WBC PRESENT, PREDOMINANTLY PMN NO ORGANISMS SEEN    Culture   Final    NO GROWTH < 24 HOURS Performed at [facility],  [place] [address] [place], [place] [address]    Report Status PENDING  Incomplete    Coagulation Studies: Recent Labs    01/17/2022 1820 01/03/2022 0628  LABPROT 14.8 15.2  INR 1.2 1.2     Urinalysis: No results for input(s): "COLORURINE", "LABSPEC", "PHURINE", "GLUCOSEU", "HGBUR", "BILIRUBINUR", "KETONESUR", "PROTEINUR", "UROBILINOGEN", "NITRITE", "LEUKOCYTESUR" in the last 72 hours.  Invalid input(s): "APPERANCEUR"    Imaging: MR HIP RIGHT WO CONTRAST  Result Date: 01/19/2022 CLINICAL DATA:  Right hip pain. Pain with movement. Patient has a fever. EXAM: MR OF THE RIGHT HIP WITHOUT CONTRAST TECHNIQUE: Multiplanar,  multisequence MR imaging was performed. No intravenous contrast was administered. COMPARISON:  None Available. FINDINGS: Bones: No hip fracture, dislocation or avascular necrosis. Mild marrow edema in the right medial femoral head and acetabulum. No periosteal reaction or bone destruction. No aggressive osseous lesion. Normal sacrum and sacroiliac joints. No SI joint widening or erosive changes. Articular cartilage and labrum Articular cartilage: High-grade partial-thickness cartilage loss of the right femoral head and acetabulum. Labrum: Grossly intact, but evaluation is limited by lack of intraarticular fluid. Joint or bursal effusion Joint effusion: Large right hip joint effusion. No left joint effusion. No SI joint effusion. Bursae:  No bursal fluid. Muscles and tendons Mild soft tissue edema in the right adductor musculature and right gluteus maximus muscle adjacent to the trochanteric insertion. Other findings No pelvic free fluid. No fluid collection or hematoma. No inguinal lymphadenopathy. No inguinal hernia. IMPRESSION: 1. High-grade partial-thickness cartilage loss of the right femoral head and acetabulum. Mild marrow edema in the right medial femoral head and acetabulum. Large right hip joint effusion. Mild soft tissue edema in the right adductor musculature and right gluteus  maximus muscle adjacent to the trochanteric insertion. The overall appearance is concerning for septic arthritis. If there is clinical concern regarding septic arthritis, recommend arthrocentesis. These results will be called to the ordering clinician or representative by the Radiologist Assistant, and communication documented in the PACS or [employer]. Electronically Signed   By: Kathreen Devoid M.D.   On: 01/25/2022 07:39   DG Chest 2 View  Result Date: 01/14/2022 CLINICAL DATA:  Suspected sepsis. EXAM: CHEST - 2 VIEW COMPARISON:  Chest x-ray 10/26/2021 FINDINGS: Right-sided central venous catheter tip projects over the distal SVC. Left-sided central venous catheter tip projects over the cavoatrial junction. There is a vascular stent in the left axillary region. The lungs are clear. There is no pleural effusion or pneumothorax. The cardiomediastinal silhouette is within normal limits. No acute fractures. IMPRESSION: No active cardiopulmonary disease. Electronically Signed   By: Ronney Asters M.D.   On: 12/31/2021 18:17     Medications:    ceFEPime (MAXIPIME) IV 1 g (12/27/2021 2221)   vancomycin Stopped (01/24/2022 1217)    aspirin EC  81 mg Oral Daily   carvedilol  25 mg Oral BID   Chlorhexidine Gluconate Cloth  6 each Topical Q0600   cinacalcet  30 mg Oral Q breakfast   docusate sodium  100 mg Oral BID   heparin injection (subcutaneous)  5,000 Units Subcutaneous Q8H   insulin aspart  0-15 Units Subcutaneous TID AC & HS   insulin aspart protamine- aspart  15 Units Subcutaneous BID WC   losartan  50 mg Oral Daily   pantoprazole  40 mg Oral Daily   sevelamer carbonate  2.4 g Oral TID WC   vitamin A  20,000 Units Oral Daily   Vitamin E  800 Units Oral Daily   acetaminophen, bisacodyl, cyclobenzaprine, diphenhydrAMINE, HYDROmorphone (DILAUDID) injection, lidocaine, metoCLOPramide **OR** metoCLOPramide (REGLAN) injection, ondansetron **OR** ondansetron (ZOFRAN) IV, oxyCODONE, sodium  phosphate  Assessment/ Plan:  [name] [name] is a 46 y.o.  male with past medical conditions including CAD, diabetes, GERD, CHF, hypertension, and end-stage renal disease on hemodialysis.  Patient presents to the emergency department complaining of groin pain and difficulty with ambulation.  Patient has been admitted for Right hip joint effusion [M25.451]   [facility]/MWF/left aVF/Rt Permcath  End-stage renal disease on hemodialysis.  Will maintain outpatient schedule if possible.   Next treatment scheduled  for Wednesday.  2. Anemia of chronic kidney disease Normocytic Lab Results  Component Value Date   HGB 7.6 (L) 01/07/2022    Hemoglobin below desired target.  Patient receives Mircera out patient.  Hgb improved after transfusion this admission.  Added Epogen to regimen.  3. Secondary Hyperparathyroidism: with outpatient labs: PTH 104, phosphorus 5.4, calcium 8.7 on 12/09/2021.   Lab Results  Component Value Date   CALCIUM 8.3 (L) 01/07/2022   CAION 0.87 (LL) 07/11/2021   PHOS 7.2 (H) 08/30/2021  Calcium and phosphorus within acceptable range.  Patient received sevelamer as outpatient.  Phosphorus in August 5.4.  We will continue to monitor bone minerals during this admission.   4. Diabetes mellitus type II with chronic kidney disease/renal manifestations: insulin dependent. Home regimen includes Humulin 70/30. Most recent hemoglobin A1c is 10.0 on 08/24/21.   Glucose elevated at times.   5.  Right hip joint effusion seen on right hip MRI.  Antibiotic therapy ordered by primary team.  Blood cultures Positive for gram-positive cocci growing Staph epidermidis.patient underwent open irrigation and debridement of right hip on 01/07/22. Requesting removal of IJ PermCath.  Consult placed to vascular surgery.    LOS: 2 Colon Flattery 9/12/20232:36 PM  Patient was examined and evaluated with Colon Flattery, NP.  Plan of care was formulated and discussed with  patient as well as NP.  I agree with the note as documented with edits.

## 2022-01-08 ENCOUNTER — Inpatient Hospital Stay (HOSPITAL_COMMUNITY)
Admit: 2022-01-08 | Discharge: 2022-01-08 | Disposition: A | Discharge status: Home / Self Care | Payer: Medicare Other | Attending: Infectious Diseases | Admitting: Infectious Diseases

## 2022-01-08 ENCOUNTER — Encounter: Payer: Self-pay | Admitting: Family Medicine

## 2022-01-08 DIAGNOSIS — M00051 Staphylococcal arthritis, right hip: Secondary | ICD-10-CM | POA: Diagnosis not present

## 2022-01-08 DIAGNOSIS — D649 Anemia, unspecified: Secondary | ICD-10-CM | POA: Diagnosis present

## 2022-01-08 DIAGNOSIS — I120 Hypertensive chronic kidney disease with stage 5 chronic kidney disease or end stage renal disease: Secondary | ICD-10-CM

## 2022-01-08 DIAGNOSIS — E1122 Type 2 diabetes mellitus with diabetic chronic kidney disease: Secondary | ICD-10-CM | POA: Diagnosis not present

## 2022-01-08 DIAGNOSIS — N186 End stage renal disease: Secondary | ICD-10-CM | POA: Diagnosis not present

## 2022-01-08 DIAGNOSIS — R7881 Bacteremia: Secondary | ICD-10-CM | POA: Diagnosis present

## 2022-01-08 DIAGNOSIS — B957 Other staphylococcus as the cause of diseases classified elsewhere: Secondary | ICD-10-CM

## 2022-01-08 DIAGNOSIS — E871 Hypo-osmolality and hyponatremia: Secondary | ICD-10-CM | POA: Diagnosis present

## 2022-01-08 DIAGNOSIS — A419 Sepsis, unspecified organism: Secondary | ICD-10-CM | POA: Diagnosis not present

## 2022-01-08 DIAGNOSIS — M009 Pyogenic arthritis, unspecified: Secondary | ICD-10-CM | POA: Diagnosis present

## 2022-01-08 DIAGNOSIS — E1142 Type 2 diabetes mellitus with diabetic polyneuropathy: Secondary | ICD-10-CM

## 2022-01-08 DIAGNOSIS — M25451 Effusion, right hip: Secondary | ICD-10-CM | POA: Diagnosis not present

## 2022-01-08 LAB — CBC
HCT: 22.5 % — ABNORMAL LOW (ref 39.0–52.0)
HCT: 24.9 % — ABNORMAL LOW (ref 39.0–52.0)
Hemoglobin: 6.6 g/dL — ABNORMAL LOW (ref 13.0–17.0)
Hemoglobin: 8 g/dL — ABNORMAL LOW (ref 13.0–17.0)
MCH: 25 pg — ABNORMAL LOW (ref 26.0–34.0)
MCH: 26.1 pg (ref 26.0–34.0)
MCHC: 29.3 g/dL — ABNORMAL LOW (ref 30.0–36.0)
MCHC: 32.1 g/dL (ref 30.0–36.0)
MCV: 85.2 fL (ref 80.0–100.0)
MCV: 81.1 fL (ref 80.0–100.0)
Platelets: 307 10*3/uL (ref 150–400)
Platelets: 271 10*3/uL (ref 150–400)
RBC: 2.64 MIL/uL — ABNORMAL LOW (ref 4.22–5.81)
RBC: 3.07 MIL/uL — ABNORMAL LOW (ref 4.22–5.81)
RDW: 17.2 % — ABNORMAL HIGH (ref 11.5–15.5)
RDW: 16.8 % — ABNORMAL HIGH (ref 11.5–15.5)
WBC: 14.3 10*3/uL — ABNORMAL HIGH (ref 4.0–10.5)
WBC: 14.1 10*3/uL — ABNORMAL HIGH (ref 4.0–10.5)
nRBC: 0 % (ref 0.0–0.2)
nRBC: 0 % (ref 0.0–0.2)

## 2022-01-08 LAB — BASIC METABOLIC PANEL
Anion gap: 12 (ref 5–15)
Anion gap: 14 (ref 5–15)
BUN: 35 mg/dL — ABNORMAL HIGH (ref 6–20)
BUN: 19 mg/dL (ref 6–20)
CO2: 26 mmol/L (ref 22–32)
CO2: 25 mmol/L (ref 22–32)
Calcium: 8.7 mg/dL — ABNORMAL LOW (ref 8.9–10.3)
Calcium: 8.4 mg/dL — ABNORMAL LOW (ref 8.9–10.3)
Chloride: 95 mmol/L — ABNORMAL LOW (ref 98–111)
Chloride: 95 mmol/L — ABNORMAL LOW (ref 98–111)
Creatinine, Ser: 10.14 mg/dL — ABNORMAL HIGH (ref 0.61–1.24)
Creatinine, Ser: 6.51 mg/dL — ABNORMAL HIGH (ref 0.61–1.24)
GFR, Estimated: 6 mL/min — ABNORMAL LOW (ref >60)
GFR, Estimated: 10 mL/min — ABNORMAL LOW (ref >60)
Glucose, Bld: 127 mg/dL — ABNORMAL HIGH (ref 70–99)
Glucose, Bld: 127 mg/dL — ABNORMAL HIGH (ref 70–99)
Potassium: 3.7 mmol/L (ref 3.5–5.1)
Potassium: 3.5 mmol/L (ref 3.5–5.1)
Sodium: 133 mmol/L — ABNORMAL LOW (ref 135–145)
Sodium: 134 mmol/L — ABNORMAL LOW (ref 135–145)

## 2022-01-08 LAB — GLUCOSE, CAPILLARY
Glucose-Capillary: 100 mg/dL — ABNORMAL HIGH (ref 70–99)
Glucose-Capillary: 129 mg/dL — ABNORMAL HIGH (ref 70–99)
Glucose-Capillary: 161 mg/dL — ABNORMAL HIGH (ref 70–99)
Glucose-Capillary: 93 mg/dL (ref 70–99)
Glucose-Capillary: 164 mg/dL — ABNORMAL HIGH (ref 70–99)

## 2022-01-08 LAB — BLOOD GAS, ARTERIAL
Acid-Base Excess: 5.5 mmol/L — ABNORMAL HIGH (ref 0.0–2.0)
Bicarbonate: 28.9 mmol/L — ABNORMAL HIGH (ref 20.0–28.0)
O2 Content: 4 L/min
O2 Saturation: 94.1 %
Patient temperature: 37
pCO2 arterial: 37 mmHg (ref 32–48)
pH, Arterial: 7.5 — ABNORMAL HIGH (ref 7.35–7.45)
pO2, Arterial: 62 mmHg — ABNORMAL LOW (ref 83–108)

## 2022-01-08 LAB — PHOSPHORUS: Phosphorus: 5.3 mg/dL — ABNORMAL HIGH (ref 2.5–4.6)

## 2022-01-08 LAB — PREPARE RBC (CROSSMATCH)

## 2022-01-08 MED ORDER — EPOETIN ALFA 4000 UNIT/ML IJ SOLN
INTRAMUSCULAR | Status: AC
  Administered 2022-01-08: 4000 [IU] via INTRAVENOUS
  Filled 2022-01-08: qty 1

## 2022-01-08 MED ORDER — SODIUM CHLORIDE 0.9% IV SOLUTION: Freq: Once | INTRAVENOUS | Status: DC

## 2022-01-08 MED ORDER — HEPARIN SODIUM (PORCINE) 1000 UNIT/ML IJ SOLN
INTRAMUSCULAR | Status: AC
  Filled 2022-01-08: qty 10

## 2022-01-08 MED ORDER — PENTAFLUOROPROP-TETRAFLUOROETH EX AERO
INHALATION_SPRAY | CUTANEOUS | Status: AC
  Filled 2022-01-08: qty 30

## 2022-01-08 NOTE — Progress Notes (Signed)
Pt returned from dialysis, alert and oriented. V/S WDL. Pt requested to be transferred to the bedside commode. He lost consciousness for close to a minute and became conscious again. Oxygen delivery via nasal canula increased to 5l/min. Pt was transferred back to bed safely. Dr. Mal Misty paged and orders were received per chart.   01/08/22 1755  Vitals  BP (!) 153/90  MAP (mmHg) 105  Pulse Rate 93  MEWS COLOR  MEWS Score Color Green  Oxygen Therapy  SpO2 92 %  O2 Device Nasal Cannula  O2 Flow Rate (L/min) 5 L/min  MEWS Score  MEWS Temp 0  MEWS Systolic 0  MEWS Pulse 0  MEWS RR 0  MEWS LOC 0  MEWS Score 0  Provider Notification  Provider Name/Title Jennye Boroughs, MD  Date Provider Notified 01/08/22  Time Provider Notified [id number]  Method of Notification Page  Notification Reason Change in status  Provider response See new orders  Date of Provider Response 01/08/22

## 2022-01-08 NOTE — Progress Notes (Signed)
Tx initiated, was unable to feel a thrill in patient access as well as no bruit was heard cannulation was attempted with a tiny amount of dark red blood in return. RN adjusted needled with no success but confirmed accurate placement of the needle. Patient denied a second cannulation and cvc was used to initiate treatment.  Patient experienced no post bleeding when needle was pulled. Will attempt a uf goal of 1584ml for this treatment. Monitoring closely. Rn notified NP of the patient cannulation difficulties.

## 2022-01-08 NOTE — Progress Notes (Signed)
Chart reviewed. Pt still in dialysis. Bedside swallow eval pending return.

## 2022-01-08 NOTE — Progress Notes (Signed)
Subjective: 2 Days Post-Op Procedure(s) (LRB): INCISION AND DRAINAGE OF RIGHT HIP (Right) Patient reports pain as moderate.  States that the pain is not worsening however. Patient is well this afternoon, he has dialysis on M,W,F.   Underwent dialysis today, underwent a blood transfusion while at dialysis PT and care management to assist with discharge planning. Negative for chest pain and shortness of breath Fever: no Gastrointestinal:Negative for nausea and vomiting this afternoon.  Objective: Vital signs in last 24 hours: Temp:  [97.8 F (36.6 C)-98.4 F (36.9 C)] 98 F (36.7 C) (09/13 1513) Pulse Rate:  [81-88] 88 (09/13 1513) Resp:  [15-25] 23 (09/13 1513) BP: (94-132)/(70-91) 123/74 (09/13 1513) SpO2:  [89 %-97 %] 94 % (09/13 1512) Weight:  [117.2 kg] 117.2 kg (09/13 1115)  Intake/Output from previous day:  Intake/Output Summary (Last 24 hours) at 01/08/2022 1653 Last data filed at 01/08/2022 1513 Gross per 24 hour  Intake 1466 ml  Output 1000 ml  Net 466 ml    Intake/Output this shift: Total I/O In: 1466 [P.O.:180; I.V.:200; Blood:1086] Out: 1000 [Other:1000]  Labs: Recent Labs    01/10/2022 1821 01/19/2022 0628 01/07/2022 1330 01/07/22 0425 01/08/22 0420  HGB 8.4* 6.8* 7.8* 7.6* 6.6*   Recent Labs    01/07/22 0425 01/08/22 0420  WBC 14.3* 14.3*  RBC 2.93* 2.64*  HCT 25.1* 22.5*  PLT 259 307   Recent Labs    01/07/22 0425 01/08/22 0420  NA 133* 133*  K 4.3 3.7  CL 95* 95*  CO2 23 26  BUN 24* 35*  CREATININE 8.27* 10.14*  GLUCOSE 283* 127*  CALCIUM 8.3* 8.7*   Recent Labs    01/10/2022 1820 01/22/2022 0628  INR 1.2 1.2     EXAM General - Patient is Alert, Appropriate, and Oriented Extremity - ABD soft Neurovascular intact Dorsiflexion/Plantar flexion intact Incision: dressing C/D/I No cellulitis present Dressing/Incision - Mild bloody drainage noted to the right hip honeycomb dressing.  Hemovac intact with mild bloody drainage.  Hemovac  was removed with issue this afternoon, new 4x4 with tegaderm applied. Motor Function - intact, moving foot and toes well on exam.  Abdomen soft with intact bowel sounds.  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   CHF (congestive heart failure) (HCC)    Coronary artery disease    Diabetes mellitus without complication ([place])    Dialysis patient [facility])    M-W-F   GERD (gastroesophageal reflux disease)    Hypertension    Kyrle's disease    Renal disorder    on dialysis for 4 years three times a week last was Tuesday   Secondary hyperparathyroidism of renal origin ([place])    Shortness of breath dyspnea    Type 2 diabetes mellitus with diabetic nephropathy (HCC)    Type 2 diabetes mellitus with diabetic retinopathy (H[facility])     Assessment/Plan: 2 Days Post-Op Procedure(s) (LRB): INCISION AND DRAINAGE OF RIGHT HIP (Right) Principal Problem:   Right hip joint effusion Active Problems:   ESRD on hemodialysis ([place]) - M, W, F via LUE AV fistula   Hypertension   Coronary artery disease   Type 2 diabetes mellitus with end-stage renal disease ([place])   Sepsis due to undetermined organism ([place])   Septic arthritis of hip ([place])   Staphylococcus epidermidis bacteremia   Severe anemia   Hyponatremia  Estimated body mass index is 35.04 kg/m as calculated from the following:   Height as of this encounter: 6' (1.829 m).  Weight as of this encounter: 117.2 kg. Advance diet Up with therapy D/C IV fluids when tolerating po intake.  Labs reviewed today. Hg 6.6 today, underwent 1 unit transfusion today while at dialysis. Hemovac was removed today with issue. Moderate WBC on gram stain, cultures still pending at this time. Bloody culture positive for Staph epidermidis. Continue with Vancomycin.  ID has been consulted. Up with therapy as tolerated.  DVT Prophylaxis -  heparin Weight-Bearing as tolerated to right leg  J. Cameron Proud, PA-C [facility]  Orthopaedic Surgery 01/08/2022, 4:53 PM

## 2022-01-08 NOTE — TOC Progression Note (Signed)
Transition of Care [facility]) - Progression Note    Patient Details  Name: [name] [name] MRN: [id number] Date of Birth: March 14, 1976  [facility]) CM/SW [place], RN Phone Number: 01/08/2022, 10:56 AM  Clinical Narrative:     Met with the patient in the room, with his mother on the phonem he is agreeable to a bedsearch for STR SNF, FL2 completed, PASSR obtained, Bedsearch sent, will review the bed offers once obtained  Expected Discharge Plan: Skilled Nursing Facility Barriers to Discharge: Continued Medical Work up  Expected Discharge Plan and Services Expected Discharge Plan: [place]                                               Social Determinants of Health (SDOH) Interventions    Readmission Risk Interventions     No data to display

## 2022-01-08 NOTE — Care Management Important Message (Signed)
Important Message  Patient Details  Name: [name] [name] MRN: [id number] Date of Birth: June 20, 1975   Medicare Important Message Given:  Yes     Loann Quill 01/08/2022, 1:36 PM

## 2022-01-08 NOTE — Progress Notes (Signed)
PT Cancellation Note  Patient Details Name: [name] [name] MRN: [id number] DOB: 1975/08/11   Cancelled Treatment:    Reason Eval/Treat Not Completed: Medical issues which prohibited therapy (Per chart review, Hb drop to 6.6 since previous day. Will defer PT services to later date/time once ABLA is corrected.)  11:25 AM, 01/08/22 Etta Grandchild, PT, DPT Physical Therapist - [facility]  [phone] ([place])    Gearlene Godsil C 01/08/2022, 11:24 AM

## 2022-01-08 NOTE — NC FL2 (Signed)
[place] LEVEL OF CARE SCREENING TOOL     IDENTIFICATION  Patient Name: [name] [name] Birthdate: [birth year] Sex: male Admission Date (Current Location): 01/04/2022  [facility] and [place] Number:  Engineering geologist and Address:  [facility], [address] [place], [place] [address]      Provider Number: [id number]  Attending Physician Name and Address:  Jennye Boroughs, MD  Relative Name and Phone Number:  Constance Holster, Mother [phone]    Current Level of Care: Hospital Recommended Level of Care: [place] Prior Approval Number:    Date Approved/Denied:   PASRR Number: [id number]  Discharge Plan:      Current Diagnoses: Patient Active Problem List   Diagnosis Date Noted   Type 2 diabetes mellitus with end-stage renal disease ([place]) 01/07/2022   Sepsis due to undetermined organism [facility])    Right hip joint effusion 01/02/2022   Elevated troponin 08/25/2021   Poorly controlled type 2 diabetes mellitus ([place]) 08/25/2021   Leukocytosis 08/25/2021   Syncope 08/24/2021   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[place]) 04/15/2021   Coronary artery disease    Obesity, Class III, BMI 40-49.9 (morbid obesity) ([place])    Chronic venous insufficiency 02/17/2017   Hypertension 07/24/2016   Venous ulcer of left leg ([place]) 07/24/2016   Uncontrolled type 2 diabetes mellitus with hyperglycemia, with long-term current use of insulin ([place]) 11/27/2015   ESRD on hemodialysis ([place]) - M, W, F via LUE AV fistula 11/02/2014   Diabetes mellitus with ESRD (end-stage renal disease) ([place]) 11/02/2014    Orientation RESPIRATION BLADDER Height & Weight     Self, Time, Situation, Place  Normal, O2 (2 liters) Continent Weight: 114.7 kg Height:  6' (182.9 cm)  BEHAVIORAL SYMPTOMS/MOOD NEUROLOGICAL BOWEL NUTRITION STATUS      Continent Diet (see dc summary)  AMBULATORY STATUS COMMUNICATION OF NEEDS Skin   Extensive Assist Verbally Normal, Surgical wounds                        Personal Care Assistance Level of Assistance  Bathing, Feeding, Dressing Bathing Assistance: Limited assistance Feeding assistance: Independent Dressing Assistance: Limited assistance     Functional Limitations Info             SPECIAL CARE FACTORS FREQUENCY  PT (By licensed PT), OT (By licensed OT)     PT Frequency: 5 times per week OT Frequency: 5 times per week            Contractures Contractures Info: Not present    Additional Factors Info  Code Status, Allergies Code Status Info: Full code Allergies Info: Cephalexin, Ivp Dye (Iodinated Contrast Media), Lisinopril, Other           Current Medications (01/08/2022):  This is the current hospital active medication list Current Facility-Administered Medications  Medication Dose Route Frequency Provider Last Rate Last Admin   0.9 %  sodium chloride infusion (Manually program via Guardrails IV Fluids)   Intravenous Once Jennye Boroughs, MD       acetaminophen (TYLENOL) tablet 650 mg  650 mg Oral Q6H PRN Ralene Muskrat B, MD   650 mg at 01/08/22 0552   aspirin EC tablet 81 mg  81 mg Oral Daily Ralene Muskrat B, MD   81 mg at 01/08/22 0844   bisacodyl (DULCOLAX) suppository 10 mg  10 mg Rectal Daily PRN Poggi, Marshall Cork, MD       carvedilol (COREG) tablet 25 mg  25 mg Oral  BID Corky Mull, MD   25 mg at 01/08/22 0844   Chlorhexidine Gluconate Cloth 2 % PADS 6 each  6 each Topical Q0600 Poggi, Marshall Cork, MD   6 each at 01/08/22 0552   cinacalcet (SENSIPAR) tablet 30 mg  30 mg Oral Q breakfast Poggi, Marshall Cork, MD   30 mg at 01/08/22 0834   cyclobenzaprine (FLEXERIL) tablet 5 mg  5 mg Oral QHS PRN Poggi, Marshall Cork, MD   5 mg at 01/07/22 1304   diphenhydrAMINE (BENADRYL) 12.5 MG/5ML elixir 12.5-25 mg  12.5-25 mg Oral Q4H PRN Corky Mull, MD   25 mg at 01/07/22 0836   docusate sodium (COLACE) capsule 100 mg  100 mg Oral BID Corky Mull, MD   100 mg at 01/08/22 0844   epoetin alfa (EPOGEN) injection 4,000  Units  4,000 Units Intravenous Q M,W,F-HD Murlean Iba, MD       heparin injection 5,000 Units  5,000 Units Subcutaneous Q8H Ralene Muskrat B, MD   5,000 Units at 01/08/22 0552   HYDROmorphone (DILAUDID) injection 0.5 mg  0.5 mg Intravenous Q4H PRN Sreenath, Sudheer B, MD       insulin aspart (novoLOG) injection 0-15 Units  0-15 Units Subcutaneous TID AC & HS Poggi, Marshall Cork, MD   3 Units at 01/08/22 0831   insulin aspart protamine- aspart (NOVOLOG MIX 70/30) injection 15 Units  15 Units Subcutaneous BID WC Poggi, Marshall Cork, MD   15 Units at 01/08/22 0843   lidocaine (XYLOCAINE) 4 % external solution 1 mL  1 mL Topical PRN Poggi, Marshall Cork, MD       losartan (COZAAR) tablet 50 mg  50 mg Oral Daily Ralene Muskrat B, MD   50 mg at 01/08/22 0844   metoCLOPramide (REGLAN) tablet 5-10 mg  5-10 mg Oral Q8H PRN Poggi, Marshall Cork, MD       Or   metoCLOPramide (REGLAN) injection 5-10 mg  5-10 mg Intravenous Q8H PRN Poggi, Marshall Cork, MD       ondansetron (ZOFRAN) tablet 4 mg  4 mg Oral Q6H PRN Poggi, Marshall Cork, MD       Or   ondansetron (ZOFRAN) injection 4 mg  4 mg Intravenous Q6H PRN Poggi, Marshall Cork, MD       oxyCODONE (Oxy IR/ROXICODONE) immediate release tablet 5 mg  5 mg Oral Q4H PRN Ralene Muskrat B, MD   5 mg at 01/07/22 1304   pantoprazole (PROTONIX) EC tablet 40 mg  40 mg Oral Daily Poggi, Marshall Cork, MD   40 mg at 01/08/22 0844   sevelamer carbonate (RENVELA) powder PACK 2.4 g  2.4 g Oral TID WC Poggi, Marshall Cork, MD   2.4 g at 01/08/22 0834   sodium phosphate (FLEET) 7-19 GM/118ML enema 1 enema  1 enema Rectal Once PRN Poggi, Marshall Cork, MD       vancomycin (VANCOCIN) IVPB 1000 mg/200 mL premix  1,000 mg Intravenous Q M,W,F-HD Poggi, Marshall Cork, MD   Stopped at 01/15/2022 1217   vitamin A capsule 20,000 Units  20,000 Units Oral Daily Poggi, Marshall Cork, MD   20,000 Units at 01/08/22 0844   Vitamin E CAPS 800 Units  800 Units Oral Daily Corky Mull, MD   800 Units at 01/08/22 0844     Discharge Medications: Please see  discharge summary for a list of discharge medications.  Relevant Imaging Results:  Relevant Lab Results:   Additional Information SSN:[id number], [facility]  Jovonda Selner  Rossie Muskrat, RN

## 2022-01-08 NOTE — Progress Notes (Signed)
RRT called. Patient passed out on [facility], transferred back to bed. Awoke and vomited. BP high, O2 5L.

## 2022-01-08 NOTE — Consult Note (Signed)
NAME: [name] [name]  DOB: 05-03-1975  MRN: [id number]  Date/Time: 01/08/2022 8:12 AM  REQUESTING PROVIDER: dr.Ayiku Subjective:  REASON FOR CONSULT: Staph epidermidis bacteremia ? [name] [name] is a 46 y.o. with a history of ESRD CAD, CHF, DM, HTN presented to the ED on 01/24/2022 by c/o pain rt hip going on for a few days for which he was taking muscle relaxants with no improvement.it recently became worse with any movt causing more pain, and forcing him to come to ED  01/01/2022  BP 157/86 !  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  Left arm shuntogram and central venogram 3.  Catheter directed thrombolysis with 4 mg of TPA  4.  Mechanical thrombectomy to the HeRO graft with the Fogarty catheter for the arterial anastomosis and the cleaner catheter for the remainder of the graft. 5.  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)    M-W-F   GERD (gastroesophageal reflux disease)    Hypertension    Kyrle's disease    Renal disorder    on dialysis for 4 years three times a week last was Tuesday   Secondary hyperparathyroidism of renal origin ([place])    Shortness of breath dyspnea    Type 2 diabetes mellitus with diabetic nephropathy (HCC)    Type 2 diabetes mellitus with diabetic retinopathy [facility])     Past Surgical History:  Procedure Laterality Date   A/V FISTULAGRAM Left 10/01/2017   Procedure: A/V FISTULAGRAM;  Surgeon: Algernon Huxley, MD;  Location: [place] [facility];  Service: Cardiovascular;  Laterality: Left;   A/V FISTULAGRAM Left 05/17/2020   Procedure: A/V FISTULAGRAM;  Surgeon: Algernon Huxley, MD;  Location: [place] [facility];  Service: Cardiovascular;  Laterality: Left;   A/V SHUNTOGRAM Left 04/16/2021   Procedure: A/V SHUNTOGRAM;  Surgeon: Katha Cabal, MD;  Location: [place] [facility];  Service: Cardiovascular;  Laterality: Left;   A/V SHUNTOGRAM Left 08/29/2021   Procedure: A/V SHUNTOGRAM;  Surgeon: Algernon Huxley, MD;  Location: [place] [facility];  Service: Cardiovascular;  Laterality: Left;   CATARACT  EXTRACTION W/PHACO Right 06/30/2018   Procedure: CATARACT EXTRACTION PHACO AND INTRAOCULAR LENS PLACEMENT (IOC)-RIGHT;  Surgeon: Eulogio Bear, MD;  Location: [facility] ORS;  Service: Ophthalmology;  Laterality: Right;  [place] 00:07.0 CDE 0.16 FLUID PACK lOT # [id number]   CATARACT EXTRACTION W/PHACO Left 12/24/2018   Procedure: CATARACT EXTRACTION PHACO AND INTRAOCULAR LENS PLACEMENT (IOC);  Surgeon: Eulogio Bear, MD;  Location: [facility] ORS;  Service: Ophthalmology;  Laterality: Left;  [place] 00:30.0 CDE 2.01 FLUID PACK LOT # [id number] H   COLONOSCOPY WITH PROPOFOL N/A 04/23/2017   Procedure: COLONOSCOPY WITH PROPOFOL;  Surgeon: [name], MD;  [facility];  Service:  Endoscopy;  Laterality: N/A;   COLONOSCOPY WITH PROPOFOL N/A 07/02/2017   Procedure: COLONOSCOPY WITH PROPOFOL;  Surgeon: [name], MD;  Location: [facility] ENDOSCOPY;  Service: Endoscopy;  Laterality: N/A;   DIALYSIS/PERMA CATHETER INSERTION N/A 09/27/2020   Procedure: DIALYSIS/PERMA CATHETER INSERTION;  Surgeon: Algernon Huxley, MD;  Location: [place] [facility];  Service: Cardiovascular;  Laterality: N/A;   DIALYSIS/PERMA CATHETER INSERTION N/A 09/19/2021   Procedure: DIALYSIS/PERMA CATHETER INSERTION;  Surgeon: Algernon Huxley, MD;  Location: [place] [facility];  Service: Cardiovascular;  Laterality: N/A;   DIALYSIS/PERMA CATHETER REMOVAL N/A 11/01/2020   Procedure: DIALYSIS/PERMA CATHETER REMOVAL;  Surgeon: Algernon Huxley, MD;  Location: [place] [facility];  Service: Cardiovascular;  Laterality: N/A;   ESOPHAGOGASTRODUODENOSCOPY (EGD) WITH PROPOFOL N/A 04/23/2017   Procedure: ESOPHAGOGASTRODUODENOSCOPY (EGD) WITH PROPOFOL;  Surgeon: [name], MD;  Location: [facility] ENDOSCOPY;  Service: Endoscopy;  Laterality: N/A;   ESOPHAGOGASTRODUODENOSCOPY (EGD) WITH PROPOFOL N/A 03/27/2020   Procedure: ESOPHAGOGASTRODUODENOSCOPY (EGD) WITH PROPOFOL;  Surgeon: Lesly Rubenstein, MD;  Location: [facility] ENDOSCOPY;  Service: Endoscopy;  Laterality: N/A;   EYE SURGERY     RETINA   INCISION AND DRAINAGE Right 01/25/2022   Procedure: INCISION AND DRAINAGE OF RIGHT HIP;  Surgeon: Corky Mull, MD;  Location: [facility] ORS;  Service: Orthopedics;  Laterality: Right;   INSERTION OF DIALYSIS CATHETER Right 08/09/2020   Procedure: INSERTION OF DIALYSIS CATHETER;  Surgeon: Algernon Huxley, MD;  Location: [facility] ORS;  Service: Vascular;  Laterality: Right;   LEFT HEART CATH AND CORONARY ANGIOGRAPHY N/A 08/27/2021   Procedure: LEFT HEART CATH AND CORONARY ANGIOGRAPHY;  Surgeon: Dionisio David, MD;  Location: [place] [facility];  Service: Cardiovascular;  Laterality: N/A;   PERIPHERAL VASCULAR CATHETERIZATION N/A 05/07/2015    Procedure: A/V Shuntogram/Fistulagram;  Surgeon: Katha Cabal, MD;  Location: [place] [facility];  Service: Cardiovascular;  Laterality: N/A;   PERIPHERAL VASCULAR CATHETERIZATION Left 05/07/2015   Procedure: A/V Shunt Intervention;  Surgeon: Katha Cabal, MD;  Location: [place] [facility];  Service: Cardiovascular;  Laterality: Left;   PERIPHERAL VASCULAR THROMBECTOMY Left 07/16/2020   Procedure: PERIPHERAL VASCULAR THROMBECTOMY;  Surgeon: Algernon Huxley, MD;  Location: [place] [facility];  Service: Cardiovascular;  Laterality: Left;   PERIPHERAL VASCULAR THROMBECTOMY Left 08/27/2020   Procedure: PERIPHERAL VASCULAR THROMBECTOMY;  Surgeon: Algernon Huxley, MD;  Location: [place] [facility];  Service: Cardiovascular;  Laterality: Left;   PERIPHERAL VASCULAR THROMBECTOMY N/A 09/26/2021   Procedure: PERIPHERAL VASCULAR THROMBECTOMY;  Surgeon: Algernon Huxley, MD;  Location: [place] [facility];  Service: Cardiovascular;  Laterality: N/A;   REVISON OF ARTERIOVENOUS FISTULA Left 08/09/2020   Procedure: REVISON OF ARTERIOVENOUS FISTULA;  Surgeon: Algernon Huxley, MD;  Location: [facility] OR[facility];  Service: Vascular;  Laterality: Left;   TEE WITHOUT CARDIOVERSION N/A  07/05/2015   Procedure: TRANSESOPHAGEAL ECHOCARDIOGRAM (TEE);  Surgeon: Dionisio David, MD;  Location: [facility];  Service: Cardiovascular;  Laterality: N/A;   UPPER EXTREMITY ANGIOGRAPHY Left 06/24/2018   Procedure: UPPER EXTREMITY ANGIOGRAPHY;  Surgeon: Algernon Huxley, MD;  Location: [place] [facility];  Service: Cardiovascular;  Laterality: Left;   VASCULAR ACCESS DEVICE INSERTION Left 07/11/2021   Procedure: INSERTION OF HERO VASCULAR ACCESS DEVICE;  Surgeon: Algernon Huxley, MD;  Location: [facility];  Service: Vascular;  Laterality: Left;   WOUND DEBRIDEMENT N/A 09/25/2020   Procedure: DEBRIDEMENT WOUND;  Surgeon: Katha Cabal, MD;  Location: [facility];  Service: Vascular;  Laterality: N/A;    Social History   Socioeconomic History   Marital  status: Single    Spouse name: Not on file   Number of children: Not on file   Years of education: Not on file   Highest education level: Not on file  Occupational History   Not on file  Tobacco Use   Smoking status: Never   Smokeless tobacco: Never  Vaping Use   Vaping Use: Never used  Substance and Sexual Activity   Alcohol use: Not Currently    Comment: occ. shot   Drug use: No   Sexual activity: Not on file  Other Topics Concern   Not on file  Social History Narrative   Lives by himself   Social Determinants of Health   Financial Resource Strain: Not on file  Food Insecurity: Unknown (01/25/2022)   Hunger Vital Sign    Worried About Running Out of Food in the Last Year: Patient refused    [place] in the Last Year: Patient refused  Transportation Needs: Unknown (12/28/2021)   PRAPARE - Hydrologist (Medical): Patient refused    Lack of Transportation (Non-Medical): Patient refused  Physical Activity: Not on file  Stress: Not on file  Social Connections: Not on file  Intimate Partner Violence: Unknown (01/24/2022)   Humiliation, Afraid, Rape, and Kick questionnaire    Fear of Current or Ex-Partner: Patient refused    Emotionally Abused: Patient refused    Physically Abused: Patient refused    Sexually Abused: Patient refused    Family History  Problem Relation Age of Onset   Diabetes Father    Allergies  Allergen Reactions   Cephalexin Nausea Only   Ivp Dye [Iodinated Contrast Media] Diarrhea   Lisinopril Other (See Comments)    Pt cannot recall reaction, cough   Other    I? Current Facility-Administered Medications  Medication Dose Route Frequency Provider Last Rate Last Admin   0.9 %  sodium chloride infusion (Manually program via Guardrails IV Fluids)   Intravenous Once Jennye Boroughs, MD       acetaminophen (TYLENOL) tablet 650 mg  650 mg Oral Q6H PRN Ralene Muskrat B, MD   650 mg at 01/08/22 0086   aspirin EC  tablet 81 mg  81 mg Oral Daily Ralene Muskrat B, MD   81 mg at 01/07/22 1304   bisacodyl (DULCOLAX) suppository 10 mg  10 mg Rectal Daily PRN Poggi, Marshall Cork, MD       carvedilol (COREG) tablet 25 mg  25 mg Oral BID Poggi, Marshall Cork, MD   25 mg at 01/07/22 2242   ceFEPIme (MAXIPIME) 1 g in sodium chloride 0.9 % 100 mL IVPB  1 g Intravenous Q24H Corky Mull, MD 200 mL/hr at 01/07/22 2242 1 g at 01/07/22 2242  Chlorhexidine Gluconate Cloth 2 % PADS 6 each  6 each Topical Q0600 Poggi, Marshall Cork, MD   6 each at 01/08/22 0552   cinacalcet (SENSIPAR) tablet 30 mg  30 mg Oral Q breakfast Poggi, Marshall Cork, MD   30 mg at 01/07/22 0839   cyclobenzaprine (FLEXERIL) tablet 5 mg  5 mg Oral QHS PRN Poggi, Marshall Cork, MD   5 mg at 01/07/22 1304   diphenhydrAMINE (BENADRYL) 12.5 MG/5ML elixir 12.5-25 mg  12.5-25 mg Oral Q4H PRN Corky Mull, MD   25 mg at 01/07/22 0836   docusate sodium (COLACE) capsule 100 mg  100 mg Oral BID Corky Mull, MD   100 mg at 01/07/22 2242   epoetin alfa (EPOGEN) injection 4,000 Units  4,000 Units Intravenous Q M,W,F-HD Murlean Iba, MD       heparin injection 5,000 Units  5,000 Units Subcutaneous Q8H Ralene Muskrat B, MD   5,000 Units at 01/08/22 0552   HYDROmorphone (DILAUDID) injection 0.5 mg  0.5 mg Intravenous Q4H PRN Sreenath, Sudheer B, MD       insulin aspart (novoLOG) injection 0-15 Units  0-15 Units Subcutaneous TID AC & HS Poggi, Marshall Cork, MD   3 Units at 01/07/22 1733   insulin aspart protamine- aspart (NOVOLOG MIX 70/30) injection 15 Units  15 Units Subcutaneous BID WC Poggi, Marshall Cork, MD   15 Units at 01/07/22 1734   lidocaine (XYLOCAINE) 4 % external solution 1 mL  1 mL Topical PRN Poggi, Marshall Cork, MD       losartan (COZAAR) tablet 50 mg  50 mg Oral Daily Ralene Muskrat B, MD   50 mg at 01/07/22 4696   metoCLOPramide (REGLAN) tablet 5-10 mg  5-10 mg Oral Q8H PRN Poggi, Marshall Cork, MD       Or   metoCLOPramide (REGLAN) injection 5-10 mg  5-10 mg Intravenous Q8H PRN Poggi, Marshall Cork,  MD       ondansetron (ZOFRAN) tablet 4 mg  4 mg Oral Q6H PRN Poggi, Marshall Cork, MD       Or   ondansetron (ZOFRAN) injection 4 mg  4 mg Intravenous Q6H PRN Poggi, Marshall Cork, MD       oxyCODONE (Oxy IR/ROXICODONE) immediate release tablet 5 mg  5 mg Oral Q4H PRN Ralene Muskrat B, MD   5 mg at 01/07/22 1304   pantoprazole (PROTONIX) EC tablet 40 mg  40 mg Oral Daily Poggi, Marshall Cork, MD   40 mg at 01/07/22 2952   sevelamer carbonate (RENVELA) powder PACK 2.4 g  2.4 g Oral TID WC Poggi, Marshall Cork, MD   2.4 g at 01/07/22 1734   sodium phosphate (FLEET) 7-19 GM/118ML enema 1 enema  1 enema Rectal Once PRN Poggi, Marshall Cork, MD       vancomycin (VANCOCIN) IVPB 1000 mg/200 mL premix  1,000 mg Intravenous Q M,W,F-HD Poggi, Marshall Cork, MD   Stopped at 01/07/2022 1217   vitamin A capsule 20,000 Units  20,000 Units Oral Daily Poggi, Marshall Cork, MD   20,000 Units at 01/07/22 0840   Vitamin E CAPS 800 Units  800 Units Oral Daily Corky Mull, MD   800 Units at 01/07/22 0839     Abtx:  Anti-infectives (From admission, onward)    Start     Dose/Rate Route Frequency Ordered Stop   01/19/2022 2200  ceFEPIme (MAXIPIME) 1 g in sodium chloride 0.9 % 100 mL IVPB        1 g  200 mL/hr over 30 Minutes Intravenous Every 24 hours 01/17/2022 2350 01/12/22 2159   01/18/2022 1200  vancomycin (VANCOCIN) IVPB 1000 mg/200 mL premix        1,000 mg 200 mL/hr over 60 Minutes Intravenous Every M-W-F (Hemodialysis) 01/02/2022 2353 01/13/22 1159   01/20/2022 0000  vancomycin (VANCOREADY) IVPB 1250 mg/250 mL       See Hyperspace for full Linked Orders Report.   1,250 mg 166.7 mL/hr over 90 Minutes Intravenous  Once 01/11/2022 2300 12/31/2021 0233   01/18/2022 0000  vancomycin (VANCOREADY) IVPB 1250 mg/250 mL       See Hyperspace for full Linked Orders Report.   1,250 mg 166.7 mL/hr over 90 Minutes Intravenous  Once 12/30/2021 2300 12/28/2021 0427   01/14/2022 2300  ceFEPIme (MAXIPIME) 2 g in sodium chloride 0.9 % 100 mL IVPB        2 g 200 mL/hr over 30 Minutes  Intravenous  Once 01/11/2022 2251 01/12/2022 0041   01/20/2022 2300  vancomycin (VANCOCIN) IVPB 1000 mg/200 mL premix  Status:  Discontinued        1,000 mg 200 mL/hr over 60 Minutes Intravenous  Once 01/08/2022 2251 01/07/2022 2300       REVIEW OF SYSTEMS:  Const: negative fever, negative chills, negative weight loss Eyes: negative diplopia or visual changes, negative eye pain ENT: negative coryza, negative sore throat Resp: negative cough, hemoptysis, dyspnea Cards: negative for chest pain, palpitations, lower extremity edema GU: negative for frequency, dysuria and hematuria GI: Negative for abdominal pain, diarrhea, bleeding, constipation Skin: negative for rash and pruritus Heme: negative for easy bruising and gum/nose bleeding MS: rt hip pain, unable to walk or move leg Neurolo:wasting of muscles of hands Peripheral neuropathy Psych:  anxiety, depression  Endocrine: , diabetes Allergy/Immunology- as above- antibiotic keflex- nausea so not aan allergy but just a side effects: Objective:  VITALS:  BP 106/76 (BP Location: Right Arm)   Pulse 82   Temp 98.3 F (36.8 C)   Resp 16   Ht 6' (1.829 m)   Wt 114.7 kg   SpO2 90%   BMI 34.29 kg/m  LDA Rt IJ HD cath PHYSICAL EXAM:  General: Alert, cooperative, no distress, chronically ill Head: Normocephalic, without obvious abnormality, atraumatic. Eyes: Conjunctivae clear, anicteric sclerae. Pupils are equal, pale conjuctiva ENT Nares normal. No drainage or sinus tenderness. Lips, mucosa, and tongue normal. No Thrush Neck:  symmetrical, no adenopathy, thyroid: non tender no carotid bruit and no JVD. Back: did not examine Lungs: b/l air entry Heart: s1s2. Abdomen: Soft, non-tender,not distended. Bowel sounds normal. No masses Extremities:hands- some clawing- interossei wasting Rthip  Drain present Skin: old scars left leg Lymph: Cervical, supraclavicular normal. Neurologic: Grossly non-focal Pertinent Labs Lab Results CBC     Component Value Date/Time   WBC 14.3 (H) 01/08/2022 0420   RBC 2.64 (L) 01/08/2022 0420   HGB 6.6 (L) 01/08/2022 0420   HGB 10.9 (L) 02/23/2014 0356   HCT 22.5 (L) 01/08/2022 0420   HCT 34.7 (L) 02/23/2014 0356   PLT 307 01/08/2022 0420   PLT 160 02/23/2014 0356   MCV 85.2 01/08/2022 0420   MCV 89 02/23/2014 0356   MCH 25.0 (L) 01/08/2022 0420   MCHC 29.3 (L) 01/08/2022 0420   RDW 17.2 (H) 01/08/2022 0420   RDW 16.1 (H) 02/23/2014 0356   LYMPHSABS 0.5 (L) 01/23/2022 1821   LYMPHSABS 0.4 (L) 02/23/2014 0356   MONOABS 0.8 01/21/2022 1821   MONOABS 0.1 (L) 02/23/2014 2841  EOSABS 0.2 01/15/2022 1821   EOSABS 0.0 02/23/2014 0356   BASOSABS 0.1 01/11/2022 1821   BASOSABS 0.0 02/23/2014 0356       Latest Ref Rng & Units 01/08/2022    4:20 AM 01/07/2022    4:25 AM 01/04/2022    6:28 AM  CMP  Glucose 70 - 99 mg/dL 127  283  196   BUN 6 - 20 mg/dL 35  24  27   Creatinine 0.61 - 1.24 mg/dL 10.14  8.27  11.63   Sodium 135 - 145 mmol/L 133  133  131   Potassium 3.5 - 5.1 mmol/L 3.7  4.3  3.6   Chloride 98 - 111 mmol/L 95  95  96   CO2 22 - 32 mmol/L 26  23  23    Calcium 8.9 - 10.3 mg/dL 8.7  8.3  8.3       Microbiology: Recent Results (from the past 240 hour(s))  Culture, blood (Routine x 2)     Status: None (Preliminary result)   Collection Time: 12/27/2021  6:31 PM   Specimen: Left Antecubital; Blood  Result Value Ref Range Status   Specimen Description LEFT ANTECUBITAL  Final   Special Requests   Final    BOTTLES DRAWN AEROBIC AND ANAEROBIC Blood Culture adequate volume Performed at [facility], [address] [place], [place] [address]    Culture  Setup Time   Final    GRAM POSITIVE COCCI IN BOTH AEROBIC AND ANAEROBIC BOTTLES CRITICAL VALUE NOTED.  VALUE IS CONSISTENT WITH PREVIOUSLY REPORTED AND CALLED VALUE. DLB    Culture GRAM POSITIVE COCCI  Final   Report Status PENDING  Incomplete  Culture, blood (Routine x 2)     Status: Abnormal (Preliminary  result)   Collection Time: 01/04/2022  6:31 PM   Specimen: BLOOD LEFT FOREARM  Result Value Ref Range Status   Specimen Description   Final    BLOOD LEFT FOREARM Performed at [facility], [address] [place], [place] [address]    Special Requests   Final    BOTTLES DRAWN AEROBIC AND ANAEROBIC Blood Culture adequate volume Performed at [facility], [address] [place], [place] [address]    Culture  Setup Time   Final    GRAM POSITIVE COCCI IN BOTH AEROBIC AND ANAEROBIC BOTTLES CRITICAL RESULT CALLED TO, READ BACK BY AND VERIFIED WITH: ALEX CHAPPELL AT 1627 ON 01/04/2022 BY SS    Culture STAPHYLOCOCCUS EPIDERMIDIS (A)  Final   Report Status PENDING  Incomplete  Blood Culture ID Panel (Reflexed)     Status: Abnormal   Collection Time: 01/04/2022  6:31 PM  Result Value Ref Range Status   Enterococcus faecalis NOT DETECTED NOT DETECTED Final   Enterococcus Faecium NOT DETECTED NOT DETECTED Final   Listeria monocytogenes NOT DETECTED NOT DETECTED Final   Staphylococcus species DETECTED (A) NOT DETECTED Final    Comment: CRITICAL RESULT CALLED TO, READ BACK BY AND VERIFIED WITH: ALEX CHAPPELLE AT 1627 ON 01/20/2022 BY SS    Staphylococcus aureus (BCID) NOT DETECTED NOT DETECTED Final   Staphylococcus epidermidis DETECTED (A) NOT DETECTED Final    Comment: CRITICAL RESULT CALLED TO, READ BACK BY AND VERIFIED WITH: ALEX CHAPPELLE AT 1627 ON 01/15/2022 BY SS    Staphylococcus lugdunensis NOT DETECTED NOT DETECTED Final   Streptococcus species NOT DETECTED NOT DETECTED Final   Streptococcus agalactiae NOT DETECTED NOT DETECTED Final   Streptococcus pneumoniae NOT DETECTED NOT DETECTED Final  Streptococcus pyogenes NOT DETECTED NOT DETECTED Final   A.calcoaceticus-baumannii NOT DETECTED NOT DETECTED Final   Bacteroides fragilis NOT DETECTED NOT DETECTED Final   Enterobacterales NOT DETECTED NOT DETECTED Final   Enterobacter cloacae complex NOT DETECTED NOT DETECTED  Final   Escherichia coli NOT DETECTED NOT DETECTED Final   Klebsiella aerogenes NOT DETECTED NOT DETECTED Final   Klebsiella oxytoca NOT DETECTED NOT DETECTED Final   Klebsiella pneumoniae NOT DETECTED NOT DETECTED Final   Proteus species NOT DETECTED NOT DETECTED Final   Salmonella species NOT DETECTED NOT DETECTED Final   Serratia marcescens NOT DETECTED NOT DETECTED Final   Haemophilus influenzae NOT DETECTED NOT DETECTED Final   Neisseria meningitidis NOT DETECTED NOT DETECTED Final   Pseudomonas aeruginosa NOT DETECTED NOT DETECTED Final   Stenotrophomonas maltophilia NOT DETECTED NOT DETECTED Final   Candida albicans NOT DETECTED NOT DETECTED Final   Candida auris NOT DETECTED NOT DETECTED Final   Candida glabrata NOT DETECTED NOT DETECTED Final   Candida krusei NOT DETECTED NOT DETECTED Final   Candida parapsilosis NOT DETECTED NOT DETECTED Final   Candida tropicalis NOT DETECTED NOT DETECTED Final   Cryptococcus neoformans/gattii NOT DETECTED NOT DETECTED Final   Methicillin resistance mecA/C NOT DETECTED NOT DETECTED Final    Comment: Performed at [facility], [address] [place], [place] [address]  SARS Coronavirus 2 by RT PCR (hospital order, performed in [place] hospital lab) *cepheid single result test* Anterior Nasal Swab     Status: None   Collection Time: 01/14/2022 10:20 PM   Specimen: Anterior Nasal Swab  Result Value Ref Range Status   SARS Coronavirus 2 by RT PCR NEGATIVE NEGATIVE Final    Comment: (NOTE) SARS-CoV-2 target nucleic acids are NOT DETECTED.  The SARS-CoV-2 RNA is generally detectable in upper and lower respiratory specimens during the acute phase of infection. The lowest concentration of SARS-CoV-2 viral copies this assay can detect is 250 copies / mL. A negative result does not preclude SARS-CoV-2 infection and should not be used as the sole basis for treatment or other patient management decisions.  A negative result may occur  with improper specimen collection / handling, submission of specimen other than nasopharyngeal swab, presence of viral mutation(s) within the areas targeted by this assay, and inadequate number of viral copies (<250 copies / mL). A negative result must be combined with clinical observations, patient history, and epidemiological information.  Fact Sheet for Patients:   https://www.patel.info/  Fact Sheet for Healthcare Providers: https://hall.com/  This test is not yet approved or  cleared by the [place] FDA and has been authorized for detection and/or diagnosis of SARS-CoV-2 by FDA under an Emergency Use Authorization (EUA).  This EUA will remain in effect (meaning this test can be used) for the duration of the COVID-19 declaration under Section 564(b)(1) of the Act, 21 U.S.C. section 360bbb-3(b)(1), unless the authorization is terminated or revoked sooner.  Performed at [facility], [address], [place], [place] [address]   Surgical pcr screen     Status: None   Collection Time: 01/20/2022  2:30 PM   Specimen: Nasal Mucosa; Nasal Swab  Result Value Ref Range Status   MRSA, PCR NEGATIVE NEGATIVE Final   Staphylococcus aureus NEGATIVE NEGATIVE Final    Comment: (NOTE) The Xpert SA Assay (FDA approved for NASAL specimens in patients 20 years of age and older), is one component of a comprehensive surveillance program. It is not intended to diagnose infection nor to guide  or monitor treatment. Performed at [facility], [address] [place], [place] [address]   Aerobic/Anaerobic Culture w Gram Stain (surgical/deep wound)     Status: None (Preliminary result)   Collection Time: 01/01/2022  6:45 PM   Specimen: PATH Other; Tissue  Result Value Ref Range Status   Specimen Description   Final    WOUND Performed at [facility], [address] [place], [place] [address]    Special Requests   Final     RIGHT HIP INCISION Performed at [facility], [address] [place], [place] [address]    Gram Stain   Final    MODERATE WBC PRESENT, PREDOMINANTLY PMN NO ORGANISMS SEEN    Culture   Final    NO GROWTH < 24 HOURS Performed at [facility], [place] [address] [place], [place] [address]    Report Status PENDING  Incomplete    IMAGING RESULTS: MRI of the rt hip- large hip joint effusion.   I have personally reviewed the films ?CXR no infiltrate  Impression/Recommendation ESRD on dialysis thru HD cath Has left AVG which is a HERO graft- not sure whether that is functional  Rt hip joint pain - septic arthritis is a concern- underwent I/D and has a drain Cultures pending No synovial fluid was sent for cell count  Staph epidermidis bacteremia 4/4 Pt has RT IJ HD cathter since May- this could be the source This needs to be removed and also ?2 d echo is needed- may need TEE Repeat blood culture has been sent- on vanco and cefepime- stop cefepime- await susceptibility to decide on further de-escalation  DM with peripheral neuropathy  Anemia- ESRD   HTN on losartan CHF on carvedilol   ___________________________________________________ Discussed with patient, and his mother  Will discuss with care teamNote:  This document was prepared using Dragon voice recognition software and may include unintentional dictation errors.

## 2022-01-08 NOTE — Progress Notes (Signed)
Rapid Response Event Note   Reason for Call : syncopal episode, elevated BP   Initial Focused Assessment: On my arrival pt is very lethargic but able to wake up and state name, year, and that he is in the hospital. He says he just feels tired. BP on my arrival 168/103 but was previously 190/119. O2 sats dipping into the 80s on 2L [place]. HR 93. According to primary RN, pt came back from dialysis and got up to bsc. Pt passed out for about a minute on bsc and then came back to. Pt was then transferred back to bed and started vomitting. Pt also received 1 unit PRBCs during HD. Rapid was called.    Interventions: CBG 93. Neuro exam performed, pt is very lethargic but able to answer questions appropriately. Pupils equal and reactive. Pt does not seem to be in any distress. O2 increased to 4L [place] and O2 sats improved to 93%. Repeat BP 149/101. Hospitalist paged and ordered repeat labs, ABG, and transfer to progressive for closer monitoring. Before leaving pt's room, BP 139/97, HR 93, O2 sats 95% on 4L [place].   Plan of Care: Pt to transfer to 2A, room 256. AC aware. This RN to follow up once pt has moved to 2A.     Event Summary:   MD Notified: Dr. Mal Misty Call Time: 5929 Arrival Time: 1741 End Time: [place], RN

## 2022-01-08 NOTE — Progress Notes (Addendum)
[facility]  ROUNDING NOTE   Subjective:   [name] [name] is a 46 year old male with past medical conditions including CAD, diabetes, GERD, CHF, hypertension, and end-stage renal disease on hemodialysis.  Patient presents to the emergency department complaining of groin pain and difficulty with ambulation.  Patient has been admitted for Right hip joint effusion [M25.451]  Patient is known to our practice and receives outpatient dialysis treatments at [facility] on a MWF, supervised by [facility] physicians.  Patient resting comfortably in bed Alert and oriented Reports discomfort with movement only Appetite appropriate.  Scheduled for dialysis and permcath removal later today.    Objective:  Vital signs in last 24 hours:  Temp:  [97.9 F (36.6 C)-98.4 F (36.9 C)] 98.4 F (36.9 C) (09/13 1324) Pulse Rate:  [78-86] 86 (09/13 1300) Resp:  [15-23] 22 (09/13 1300) BP: (94-132)/(70-84) 109/75 (09/13 1324) SpO2:  [89 %-100 %] 92 % (09/13 1300) Weight:  [117.2 kg] 117.2 kg (09/13 1115)  Weight change:  Filed Weights   01/23/2022 1319 01/14/2022 1620 01/08/22 1115  Weight: 114.7 kg 114.7 kg 117.2 kg    Intake/Output: I/O last 3 completed shifts: In: 720 [I.V.:500; Other:20; IV Piggyback:200] Out: 100 [Blood:100]   Intake/Output this shift:  Total I/O In: 1140 [P.O.:180; I.V.:200; Blood:760] Out: -   Physical Exam: General: NAD, resting comfortably  Head: Normocephalic, atraumatic. Moist oral mucosal membranes  Eyes: Anicteric  Lungs:  Clear to auscultation  Heart: Regular rate and rhythm  Abdomen:  Soft, nontender  Extremities:  Trace peripheral edema.  Neurologic: Nonfocal, moving all four extremities  Skin: No lesions, Rt hip dressing  Access: Rt Permcath, Lt AVF    Basic Metabolic Panel: Recent Labs  Lab 01/12/2022 1821 01/23/2022 0628 01/07/22 0425 01/08/22 0420  NA 130* 131* 133* 133*  K 3.7 3.6 4.3 3.7  CL 94* 96* 95* 95*  CO2 20* 23 23 26    GLUCOSE 201* 196* 283* 127*  BUN 24* 27* 24* 35*  CREATININE 10.26* 11.63* 8.27* 10.14*  CALCIUM 9.1 8.3* 8.3* 8.7*  PHOS  --   --   --  5.3*    Liver Function Tests: Recent Labs  Lab 01/25/2022 1821  AST 13*  ALT 6  ALKPHOS 73  BILITOT 0.9  PROT 8.6*  ALBUMIN 3.2*   No results for input(s): "LIPASE", "AMYLASE" in the last 168 hours. No results for input(s): "AMMONIA" in the last 168 hours.  CBC: Recent Labs  Lab 12/27/2021 1821 01/01/2022 0628 12/28/2021 1330 01/07/22 0425 01/08/22 0420  WBC 14.4* 12.5*  --  14.3* 14.3*  NEUTROABS 12.7*  --   --   --   --   HGB 8.4* 6.8* 7.8* 7.6* 6.6*  HCT 29.8* 22.9* 25.8* 25.1* 22.5*  MCV 88.7 84.5  --  85.7 85.2  PLT 320 278  --  259 307    Cardiac Enzymes: No results for input(s): "CKTOTAL", "CKMB", "CKMBINDEX", "TROPONINI" in the last 168 hours.  BNP: Invalid input(s): "POCBNP"  CBG: Recent Labs  Lab 01/07/22 1648 01/07/22 2139 01/08/22 0159 01/08/22 0549 01/08/22 0823  GLUCAP 199* 90 100* 129* 161*    Microbiology: Results for orders placed or performed during the hospital encounter of 01/24/2022  Culture, blood (Routine x 2)     Status: None (Preliminary result)   Collection Time: 12/31/2021  6:31 PM   Specimen: Left Antecubital; Blood  Result Value Ref Range Status   Specimen Description   Final    LEFT ANTECUBITAL Performed  at [facility], [address], [place], [place] [address]    Special Requests   Final    BOTTLES DRAWN AEROBIC AND ANAEROBIC Blood Culture adequate volume Performed at [facility], [address] [place], [place] [address]    Culture  Setup Time   Final    GRAM POSITIVE COCCI IN BOTH AEROBIC AND ANAEROBIC BOTTLES CRITICAL VALUE NOTED.  VALUE IS CONSISTENT WITH PREVIOUSLY REPORTED AND CALLED VALUE. DLB    Culture   Final    GRAM POSITIVE COCCI IDENTIFICATION TO FOLLOW Performed at [facility], [place] [address] [place], [place] [address]    Report Status  PENDING  Incomplete  Culture, blood (Routine x 2)     Status: Abnormal (Preliminary result)   Collection Time: 01/20/2022  6:31 PM   Specimen: BLOOD LEFT FOREARM  Result Value Ref Range Status   Specimen Description   Final    BLOOD LEFT FOREARM Performed at [facility], [address], [place], [place] [address]    Special Requests   Final    BOTTLES DRAWN AEROBIC AND ANAEROBIC Blood Culture adequate volume Performed at [facility], [address] [place],  [address]    Culture  Setup Time   Final    GRAM POSITIVE COCCI IN BOTH AEROBIC AND ANAEROBIC BOTTLES CRITICAL RESULT CALLED TO, READ BACK BY AND VERIFIED WITH: ALEX CHAPPELL AT 1627 ON 01/10/2022 BY SS    Culture (A)  Final    STAPHYLOCOCCUS EPIDERMIDIS SUSCEPTIBILITIES TO FOLLOW Performed at [facility], [address] [place],  [address]    Report Status PENDING  Incomplete  Blood Culture ID Panel (Reflexed)     Status: Abnormal   Collection Time: 01/03/2022  6:31 PM  Result Value Ref Range Status   Enterococcus faecalis NOT DETECTED NOT DETECTED Final   Enterococcus Faecium NOT DETECTED NOT DETECTED Final   Listeria monocytogenes NOT DETECTED NOT DETECTED Final   Staphylococcus species DETECTED (A) NOT DETECTED Final    Comment: CRITICAL RESULT CALLED TO, READ BACK BY AND VERIFIED WITH: ALEX CHAPPELLE AT 0962 ON 12/27/2021 BY SS    Staphylococcus aureus (BCID) NOT DETECTED NOT DETECTED Final   Staphylococcus epidermidis DETECTED (A) NOT DETECTED Final    Comment: CRITICAL RESULT CALLED TO, READ BACK BY AND VERIFIED WITH: ALEX CHAPPELLE AT 1627 ON 01/11/2022 BY SS    Staphylococcus lugdunensis NOT DETECTED NOT DETECTED Final   Streptococcus species NOT DETECTED NOT DETECTED Final   Streptococcus agalactiae NOT DETECTED NOT DETECTED Final   Streptococcus pneumoniae NOT DETECTED NOT DETECTED Final   Streptococcus pyogenes NOT DETECTED NOT DETECTED Final   A.calcoaceticus-baumannii NOT  DETECTED NOT DETECTED Final   Bacteroides fragilis NOT DETECTED NOT DETECTED Final   Enterobacterales NOT DETECTED NOT DETECTED Final   Enterobacter cloacae complex NOT DETECTED NOT DETECTED Final   Escherichia coli NOT DETECTED NOT DETECTED Final   Klebsiella aerogenes NOT DETECTED NOT DETECTED Final   Klebsiella oxytoca NOT DETECTED NOT DETECTED Final   Klebsiella pneumoniae NOT DETECTED NOT DETECTED Final   Proteus species NOT DETECTED NOT DETECTED Final   Salmonella species NOT DETECTED NOT DETECTED Final   Serratia marcescens NOT DETECTED NOT DETECTED Final   Haemophilus influenzae NOT DETECTED NOT DETECTED Final   Neisseria meningitidis NOT DETECTED NOT DETECTED Final   Pseudomonas aeruginosa NOT DETECTED NOT DETECTED Final   Stenotrophomonas maltophilia NOT DETECTED NOT DETECTED Final   Candida albicans NOT DETECTED NOT DETECTED Final  Candida auris NOT DETECTED NOT DETECTED Final   Candida glabrata NOT DETECTED NOT DETECTED Final   Candida krusei NOT DETECTED NOT DETECTED Final   Candida parapsilosis NOT DETECTED NOT DETECTED Final   Candida tropicalis NOT DETECTED NOT DETECTED Final   Cryptococcus neoformans/gattii NOT DETECTED NOT DETECTED Final   Methicillin resistance mecA/C NOT DETECTED NOT DETECTED Final    Comment: Performed at [facility], [address] [place], [name] [address]  SARS Coronavirus 2 by RT PCR (hospital order, performed in [place] hospital lab) *cepheid single result test* Anterior Nasal Swab     Status: None   Collection Time: 01/07/2022 10:20 PM   Specimen: Anterior Nasal Swab  Result Value Ref Range Status   SARS Coronavirus 2 by RT PCR NEGATIVE NEGATIVE Final    Comment: (NOTE) SARS-CoV-2 target nucleic acids are NOT DETECTED.  The SARS-CoV-2 RNA is generally detectable in upper and lower respiratory specimens during the acute phase of infection. The lowest concentration of SARS-CoV-2 viral copies this assay can detect is  250 copies / mL. A negative result does not preclude SARS-CoV-2 infection and should not be used as the sole basis for treatment or other patient management decisions.  A negative result may occur with improper specimen collection / handling, submission of specimen other than nasopharyngeal swab, presence of viral mutation(s) within the areas targeted by this assay, and inadequate number of viral copies (<250 copies / mL). A negative result must be combined with clinical observations, patient history, and epidemiological information.  Fact Sheet for Patients:   https://www.patel.info/  Fact Sheet for Healthcare Providers: https://hall.com/  This test is not yet approved or  cleared by the [place] FDA and has been authorized for detection and/or diagnosis of SARS-CoV-2 by FDA under an Emergency Use Authorization (EUA).  This EUA will remain in effect (meaning this test can be used) for the duration of the COVID-19 declaration under Section 564(b)(1) of the Act, 21 U.S.C. section 360bbb-3(b)(1), unless the authorization is terminated or revoked sooner.  Performed at [facility], [address], [place], [place] [address]   Surgical pcr screen     Status: None   Collection Time: 01/07/2022  2:30 PM   Specimen: Nasal Mucosa; Nasal Swab  Result Value Ref Range Status   MRSA, PCR NEGATIVE NEGATIVE Final   Staphylococcus aureus NEGATIVE NEGATIVE Final    Comment: (NOTE) The Xpert SA Assay (FDA approved for NASAL specimens in patients 41 years of age and older), is one component of a comprehensive surveillance program. It is not intended to diagnose infection nor to guide or monitor treatment. Performed at [facility], [address] [place], [place] [address]   Aerobic/Anaerobic Culture w Gram Stain (surgical/deep wound)     Status: None (Preliminary result)   Collection Time: 01/20/2022  6:45 PM   Specimen: PATH  Other; Tissue  Result Value Ref Range Status   Specimen Description   Final    WOUND Performed at [facility], [address], [place], [place] [address]    Special Requests   Final    RIGHT HIP INCISION Performed at [facility], [address] [place], [place] [address]    Gram Stain   Final    MODERATE WBC PRESENT, PREDOMINANTLY PMN NO ORGANISMS SEEN    Culture   Final    NO GROWTH 2 DAYS Performed at [facility], [place] [address] [place], [place] [address]    Report Status  PENDING  Incomplete    Coagulation Studies: Recent Labs    01/12/2022 1820 01/21/2022 0628  LABPROT 14.8 15.2  INR 1.2 1.2    Urinalysis: No results for input(s): "COLORURINE", "LABSPEC", "PHURINE", "GLUCOSEU", "HGBUR", "BILIRUBINUR", "KETONESUR", "PROTEINUR", "UROBILINOGEN", "NITRITE", "LEUKOCYTESUR" in the last 72 hours.  Invalid input(s): "APPERANCEUR"    Imaging: No results found.   Medications:    vancomycin Stopped (01/04/2022 1217)    sodium chloride   Intravenous Once   aspirin EC  81 mg Oral Daily   carvedilol  25 mg Oral BID   Chlorhexidine Gluconate Cloth  6 each Topical Q0600   cinacalcet  30 mg Oral Q breakfast   docusate sodium  100 mg Oral BID   epoetin (EPOGEN/PROCRIT) injection  4,000 Units Intravenous Q M,W,F-HD   heparin injection (subcutaneous)  5,000 Units Subcutaneous Q8H   insulin aspart  0-15 Units Subcutaneous TID AC & HS   insulin aspart protamine- aspart  15 Units Subcutaneous BID WC   losartan  50 mg Oral Daily   pantoprazole  40 mg Oral Daily   pentafluoroprop-tetrafluoroeth       sevelamer carbonate  2.4 g Oral TID WC   vitamin A  20,000 Units Oral Daily   Vitamin E  800 Units Oral Daily   acetaminophen, bisacodyl, cyclobenzaprine, diphenhydrAMINE, HYDROmorphone (DILAUDID) injection, lidocaine, metoCLOPramide **OR** metoCLOPramide (REGLAN) injection, ondansetron **OR** ondansetron (ZOFRAN) IV, oxyCODONE,  pentafluoroprop-tetrafluoroeth, sodium phosphate  Assessment/ Plan:  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  Patient presents to the emergency department complaining of groin pain and difficulty with ambulation.  Patient has been admitted for Right hip joint effusion [M25.451]   [facility]/MWF/left aVF/Rt Permcath  End-stage renal disease on hemodialysis.  Will maintain outpatient schedule if possible.  Receiving scheduled treatment today, UF goal 2L as tolerated. Next treatment scheduled for Friday Monitoring discharge discharge to determine outpatient needs if rehab is needed. Requested removal of Permcath, however unsuccessful attempt to access herograft. Will cancel permcath removal.   2. Anemia of chronic kidney disease Normocytic Lab Results  Component Value Date   HGB 6.6 (L) 01/08/2022    Hemoglobin below desired target.  Patient receives Mircera out patient.  Will receive 1 unit blood transfusion with dialysis today. EPO ordered with dialysis.  3. Secondary Hyperparathyroidism: with outpatient labs: PTH 104, phosphorus 5.4, calcium 8.7 on 12/09/2021.   Lab Results  Component Value Date   CALCIUM 8.7 (L) 01/08/2022   CAION 0.87 (LL) 07/11/2021   PHOS 5.3 (H) 01/08/2022  Calcium and phosphorus within acceptable range.  Patient received sevelamer as outpatient.   4. Diabetes mellitus type II with chronic kidney disease/renal manifestations: insulin dependent. Home regimen includes Humulin 70/30. Most recent hemoglobin A1c is 10.0 on 08/24/21.   Glucose elevated at times.   5.  Right hip joint effusion seen on right hip MRI.  Antibiotic therapy ordered by primary team.  Blood cultures Positive for gram-positive cocci growing Staph epidermidis.patient underwent open irrigation and debridement of right hip on 01/07/22.     LOS: [place] 9/13/20231:34 PM

## 2022-01-08 NOTE — Progress Notes (Addendum)
Progress Note    [name] [name]  PPI:[id number] DOB: June 22, 1975  DOA: 01/18/2022 PCP: Danelle Berry, NP      Brief Narrative:    Medical records reviewed and are as summarized below:  [name] [name] is a 46 y.o. male with medical history significant for CHF coronary artery disease, type diabetes mellitus, end-stage renal disease on hemodialysis on Monday Wednesday Friday, GERD, hypertension and type 2 diabetes mellitus, who presented to the hospital because of right hip pain and difficulty ambulating.  He was febrile in the emergency room with a temperature of 101 F.   He was admitted to the hospital with sepsis secondary to right hip septic arthritis.  He was treated with empiric IV antibiotics.  He underwent repeat irrigation and debridement of septic right hip on 03/17/2022.  He was seen by the nephrologist for hemodialysis.  He was transfused with packed red blood cells for severe anemia   Assessment/Plan:   Principal Problem:   Right hip joint effusion Active Problems:   Sepsis due to undetermined organism (HCC)   Septic arthritis of hip (HCC)   Hypertension   Staphylococcus epidermidis bacteremia   Severe anemia   Hyponatremia   ESRD on hemodialysis ([place]) - M, W, F via LUE AV fistula   Coronary artery disease   Type 2 diabetes mellitus with end-stage renal disease ([place])    Body mass index is 35.04 kg/m.  (Obesity): This complicates overall care and prognosis  Sepsis secondary to right hip septic arthritis, Staph epidermidis bacteremia: Blood culture showed Staph epidermidis.  Wound culture from right hip has not shown any growth thus far.  Continue IV vancomycin.  Analgesics as needed for pain.  Consulted Dr. Delaine Lame, [place] specialist, to assist with management.  Acute on chronic anemia of chronic kidney disease, severe anemia, symptomatic: Transfuse 1 unit of packed red blood cells and H&H.  S/p transfusion with 1 unit of PRBCs on 01/16/2022.  Continue  epoetin  ESRD on HD on MWF: Patient is having hemodialysis today.  Follow-up with nephrologist for hemodialysis.  Type II DM: Continue NovoLog Mix.  Use NovoLog as needed for hyperglycemia.  Hyponatremia: Based on chart review, this appears to be chronic.   Diet Order             Diet Carb Modified Fluid consistency: Thin; Room service appropriate? Yes; Fluid restriction: 1500 mL Fluid  Diet effective now                            Consultants: Nephrologist Orthopedic surgeon Infectious disease specialist  Procedures: Open irrigation and debridement of septic right hip on 01/07/2022    Medications:    sodium chloride   Intravenous Once   aspirin EC  81 mg Oral Daily   carvedilol  25 mg Oral BID   Chlorhexidine Gluconate Cloth  6 each Topical Q0600   cinacalcet  30 mg Oral Q breakfast   docusate sodium  100 mg Oral BID   epoetin (EPOGEN/PROCRIT) injection  4,000 Units Intravenous Q M,W,F-HD   heparin injection (subcutaneous)  5,000 Units Subcutaneous Q8H   insulin aspart  0-15 Units Subcutaneous TID AC & HS   insulin aspart protamine- aspart  15 Units Subcutaneous BID WC   losartan  50 mg Oral Daily   pantoprazole  40 mg Oral Daily   pentafluoroprop-tetrafluoroeth       sevelamer carbonate  2.4 g Oral TID WC  vitamin A  20,000 Units Oral Daily   Vitamin E  800 Units Oral Daily   Continuous Infusions:  vancomycin 1,000 mg (01/08/22 1407)     Anti-infectives (From admission, onward)    Start     Dose/Rate Route Frequency Ordered Stop   01/11/2022 2200  ceFEPIme (MAXIPIME) 1 g in sodium chloride 0.9 % 100 mL IVPB  Status:  Discontinued        1 g 200 mL/hr over 30 Minutes Intravenous Every 24 hours 12/27/2021 2350 01/08/22 0833   12/31/2021 1200  vancomycin (VANCOCIN) IVPB 1000 mg/200 mL premix        1,000 mg 200 mL/hr over 60 Minutes Intravenous Every M-W-F (Hemodialysis) 12/29/2021 2353 01/13/22 1159   01/04/2022 0000  vancomycin (VANCOREADY) IVPB 1250  mg/250 mL       See Hyperspace for full Linked Orders Report.   1,250 mg 166.7 mL/hr over 90 Minutes Intravenous  Once 01/07/2022 2300 01/11/2022 0233   01/11/2022 0000  vancomycin (VANCOREADY) IVPB 1250 mg/250 mL       See Hyperspace for full Linked Orders Report.   1,250 mg 166.7 mL/hr over 90 Minutes Intravenous  Once 01/11/2022 2300 01/21/2022 0427   01/12/2022 2300  ceFEPIme (MAXIPIME) 2 g in sodium chloride 0.9 % 100 mL IVPB        2 g 200 mL/hr over 30 Minutes Intravenous  Once 12/28/2021 2251 01/03/2022 0041   01/19/2022 2300  vancomycin (VANCOCIN) IVPB 1000 mg/200 mL premix  Status:  Discontinued        1,000 mg 200 mL/hr over 60 Minutes Intravenous  Once 01/23/2022 2251 01/18/2022 2300              Family Communication/Anticipated D/C date and plan/Code Status   DVT prophylaxis: heparin injection 5,000 Units Start: 01/07/22 2200 SCDs Start: 01/21/2022 2001     Code Status: Full Code  Family Communication: Plan discussed with his mother on speaker phone at patient's bedside Disposition Plan: Plan to discharge to SNF when medically stable   Status is: Inpatient Remains inpatient appropriate because: On IV antibiotics for bacteremia, right hip infection       Subjective:   Interval events noted.  He was seen at the hemodialysis unit.  His dialysis nurse was at the bedside.  He complains of lightheadedness and easy fatigability.    Objective:    Vitals:   01/08/22 1340 01/08/22 1350 01/08/22 1400 01/08/22 1430  BP: 123/77 122/86 122/80 118/83  Pulse: 85 84 87 88  Resp: 19 20 19 19   Temp: 98 F (36.7 C) 97.8 F (36.6 C)    TempSrc: Oral Oral    SpO2: (!) 89% 91% 93% 92%  Weight:      Height:       No data found.   Intake/Output Summary (Last 24 hours) at 01/08/2022 1458 Last data filed at 01/08/2022 1340 Gross per 24 hour  Intake 1466 ml  Output --  Net 1466 ml   Filed Weights   01/20/2022 1319 01/23/2022 1620 01/08/22 1115  Weight: 114.7 kg 114.7 kg 117.2 kg     Exam:  GEN: NAD SKIN: Warm and dry EYES: EOMI ENT: MMM CV: RRR PULM: CTA B ABD: soft, ND, NT, +BS CNS: AAO x 3, non focal EXT: Dressing on right hip is clean, dry and intact.  Hemovac attached right hip surgical wound.        Data Reviewed:   I have personally reviewed following labs and imaging studies:  Labs:  Labs show the following:   Basic Metabolic Panel: Recent Labs  Lab 12/31/2021 1821 12/27/2021 0628 01/07/22 0425 01/08/22 0420  NA 130* 131* 133* 133*  K 3.7 3.6 4.3 3.7  CL 94* 96* 95* 95*  CO2 20* 23 23 26   GLUCOSE 201* 196* 283* 127*  BUN 24* 27* 24* 35*  CREATININE 10.26* 11.63* 8.27* 10.14*  CALCIUM 9.1 8.3* 8.3* 8.7*  PHOS  --   --   --  5.3*   GFR Estimated Creatinine Clearance: 11.9 mL/min (A) (by C-G formula based on SCr of 10.14 mg/dL (H)). Liver Function Tests: Recent Labs  Lab 01/13/2022 1821  AST 13*  ALT 6  ALKPHOS 73  BILITOT 0.9  PROT 8.6*  ALBUMIN 3.2*   No results for input(s): "LIPASE", "AMYLASE" in the last 168 hours. No results for input(s): "AMMONIA" in the last 168 hours. Coagulation profile Recent Labs  Lab 01/25/2022 1820 01/15/2022 0628  INR 1.2 1.2    CBC: Recent Labs  Lab 01/14/2022 1821 01/22/2022 0628 01/14/2022 1330 01/07/22 0425 01/08/22 0420  WBC 14.4* 12.5*  --  14.3* 14.3*  NEUTROABS 12.7*  --   --   --   --   HGB 8.4* 6.8* 7.8* 7.6* 6.6*  HCT 29.8* 22.9* 25.8* 25.1* 22.5*  MCV 88.7 84.5  --  85.7 85.2  PLT 320 278  --  259 307   Cardiac Enzymes: No results for input(s): "CKTOTAL", "CKMB", "CKMBINDEX", "TROPONINI" in the last 168 hours. BNP (last 3 results) No results for input(s): "PROBNP" in the last 8760 hours. CBG: Recent Labs  Lab 01/07/22 1648 01/07/22 2139 01/08/22 0159 01/08/22 0549 01/08/22 0823  GLUCAP 199* 90 100* 129* 161*   D-Dimer: No results for input(s): "DDIMER" in the last 72 hours. Hgb A1c: Recent Labs    01/21/2022 0628  HGBA1C 8.1*   Lipid Profile: No results for  input(s): "CHOL", "HDL", "LDLCALC", "TRIG", "CHOLHDL", "LDLDIRECT" in the last 72 hours. Thyroid function studies: No results for input(s): "TSH", "T4TOTAL", "T3FREE", "THYROIDAB" in the last 72 hours.  Invalid input(s): "FREET3" Anemia work up: No results for input(s): "VITAMINB12", "FOLATE", "FERRITIN", "TIBC", "IRON", "RETICCTPCT" in the last 72 hours. Sepsis Labs: Recent Labs  Lab 01/14/2022 1621 01/10/2022 1821 01/23/2022 0628 01/07/22 0425 01/08/22 0420  PROCALCITON  --   --  3.13  --   --   WBC  --  14.4* 12.5* 14.3* 14.3*  LATICACIDVEN 1.3  --   --   --   --     Microbiology Recent Results (from the past 240 hour(s))  Culture, blood (Routine x 2)     Status: None (Preliminary result)   Collection Time: 01/22/2022  6:31 PM   Specimen: Left Antecubital; Blood  Result Value Ref Range Status   Specimen Description   Final    LEFT ANTECUBITAL Performed at [facility], [address], [place], [place] [address]    Special Requests   Final    BOTTLES DRAWN AEROBIC AND ANAEROBIC Blood Culture adequate volume Performed at [facility], [address], [place], [place] [address]    Culture  Setup Time   Final    GRAM POSITIVE COCCI IN BOTH AEROBIC AND ANAEROBIC BOTTLES CRITICAL VALUE NOTED.  VALUE IS CONSISTENT WITH PREVIOUSLY REPORTED AND CALLED VALUE. DLB    Culture   Final    GRAM POSITIVE COCCI IDENTIFICATION TO FOLLOW Performed at [facility], [place] [address] [place], [place] [address]    Report Status  PENDING  Incomplete  Culture, blood (Routine x 2)     Status: Abnormal (Preliminary result)   Collection Time: 01/25/2022  6:31 PM   Specimen: BLOOD LEFT FOREARM  Result Value Ref Range Status   Specimen Description   Final    BLOOD LEFT FOREARM Performed at [facility], [address], [place], [place] [address]    Special Requests   Final    BOTTLES DRAWN AEROBIC AND ANAEROBIC Blood Culture adequate volume Performed at [facility], [address] [place], [place] [address]    Culture  Setup Time   Final    GRAM POSITIVE COCCI IN BOTH AEROBIC AND ANAEROBIC BOTTLES CRITICAL RESULT CALLED TO, READ BACK BY AND VERIFIED WITH: ALEX CHAPPELL AT 1627 ON 01/21/2022 BY SS    Culture (A)  Final    STAPHYLOCOCCUS EPIDERMIDIS SUSCEPTIBILITIES TO FOLLOW Performed at [facility], [address] [place], [place] [address]    Report Status PENDING  Incomplete  Blood Culture ID Panel (Reflexed)     Status: Abnormal   Collection Time: 01/13/2022  6:31 PM  Result Value Ref Range Status   Enterococcus faecalis NOT DETECTED NOT DETECTED Final   Enterococcus Faecium NOT DETECTED NOT DETECTED Final   Listeria monocytogenes NOT DETECTED NOT DETECTED Final   Staphylococcus species DETECTED (A) NOT DETECTED Final    Comment: CRITICAL RESULT CALLED TO, READ BACK BY AND VERIFIED WITH: ALEX CHAPPELLE AT 1448 ON 01/04/2022 BY SS    Staphylococcus aureus (BCID) NOT DETECTED NOT DETECTED Final   Staphylococcus epidermidis DETECTED (A) NOT DETECTED Final    Comment: CRITICAL RESULT CALLED TO, READ BACK BY AND VERIFIED WITH: ALEX CHAPPELLE AT 1856 ON 01/19/2022 BY SS    Staphylococcus lugdunensis NOT DETECTED NOT DETECTED Final   Streptococcus species NOT DETECTED NOT DETECTED Final   Streptococcus agalactiae NOT DETECTED NOT DETECTED Final   Streptococcus pneumoniae NOT DETECTED NOT DETECTED Final   Streptococcus pyogenes NOT DETECTED NOT DETECTED Final   A.calcoaceticus-baumannii NOT DETECTED NOT DETECTED Final   Bacteroides fragilis NOT DETECTED NOT DETECTED Final   Enterobacterales NOT DETECTED NOT DETECTED Final   Enterobacter cloacae complex NOT DETECTED NOT DETECTED Final   Escherichia coli NOT DETECTED NOT DETECTED Final   Klebsiella aerogenes NOT DETECTED NOT DETECTED Final   Klebsiella oxytoca NOT DETECTED NOT DETECTED Final   Klebsiella pneumoniae NOT DETECTED NOT DETECTED Final   Proteus species NOT DETECTED NOT  DETECTED Final   Salmonella species NOT DETECTED NOT DETECTED Final   Serratia marcescens NOT DETECTED NOT DETECTED Final   Haemophilus influenzae NOT DETECTED NOT DETECTED Final   Neisseria meningitidis NOT DETECTED NOT DETECTED Final   Pseudomonas aeruginosa NOT DETECTED NOT DETECTED Final   Stenotrophomonas maltophilia NOT DETECTED NOT DETECTED Final   Candida albicans NOT DETECTED NOT DETECTED Final   Candida auris NOT DETECTED NOT DETECTED Final   Candida glabrata NOT DETECTED NOT DETECTED Final   Candida krusei NOT DETECTED NOT DETECTED Final   Candida parapsilosis NOT DETECTED NOT DETECTED Final   Candida tropicalis NOT DETECTED NOT DETECTED Final   Cryptococcus neoformans/gattii NOT DETECTED NOT DETECTED Final   Methicillin resistance mecA/C NOT DETECTED NOT DETECTED Final    Comment: Performed at [facility], [address] [place], [place] [address]  SARS Coronavirus 2 by RT PCR (hospital order, performed in [facility] hospital lab) *cepheid single result test* Anterior Nasal Swab     Status: None   Collection Time: 12/27/2021  10:20 PM   Specimen: Anterior Nasal Swab  Result Value Ref Range Status   SARS Coronavirus 2 by RT PCR NEGATIVE NEGATIVE Final    Comment: (NOTE) SARS-CoV-2 target nucleic acids are NOT DETECTED.  The SARS-CoV-2 RNA is generally detectable in upper and lower respiratory specimens during the acute phase of infection. The lowest concentration of SARS-CoV-2 viral copies this assay can detect is 250 copies / mL. A negative result does not preclude SARS-CoV-2 infection and should not be used as the sole basis for treatment or other patient management decisions.  A negative result may occur with improper specimen collection / handling, submission of specimen other than nasopharyngeal swab, presence of viral mutation(s) within the areas targeted by this assay, and inadequate number of viral copies (<250 copies / mL). A negative result must be  combined with clinical observations, patient history, and epidemiological information.  Fact Sheet for Patients:   https://www.patel.info/  Fact Sheet for Healthcare Providers: https://hall.com/  This test is not yet approved or  cleared by the [place] FDA and has been authorized for detection and/or diagnosis of SARS-CoV-2 by FDA under an Emergency Use Authorization (EUA).  This EUA will remain in effect (meaning this test can be used) for the duration of the COVID-19 declaration under Section 564(b)(1) of the Act, 21 U.S.C. section 360bbb-3(b)(1), unless the authorization is terminated or revoked sooner.  Performed at [facility], [address], [place], [place] [address]   Surgical pcr screen     Status: None   Collection Time: 01/19/2022  2:30 PM   Specimen: Nasal Mucosa; Nasal Swab  Result Value Ref Range Status   MRSA, PCR NEGATIVE NEGATIVE Final   Staphylococcus aureus NEGATIVE NEGATIVE Final    Comment: (NOTE) The Xpert SA Assay (FDA approved for NASAL specimens in patients 25 years of age and older), is one component of a comprehensive surveillance program. It is not intended to diagnose infection nor to guide or monitor treatment. Performed at [facility], [address] [place], [place] [address]   Aerobic/Anaerobic Culture w Gram Stain (surgical/deep wound)     Status: None (Preliminary result)   Collection Time: 01/07/2022  6:45 PM   Specimen: PATH Other; Tissue  Result Value Ref Range Status   Specimen Description   Final    WOUND Performed at [facility], [address] [place], [place] [address]    Special Requests   Final    RIGHT HIP INCISION Performed at [facility], [address] [place], [place] [address]    Gram Stain   Final    MODERATE WBC PRESENT, PREDOMINANTLY PMN NO ORGANISMS SEEN    Culture   Final    NO GROWTH 2 DAYS NO ANAEROBES ISOLATED;  CULTURE IN PROGRESS FOR 5 DAYS Performed at [facility] [address], [place], [place] [address]    Report Status PENDING  Incomplete    Procedures and diagnostic studies:  No results found.             LOS: 3 days   Kolby Schara  [facility]   Pager on [email or web address]. If 7PM-7AM, please contact night-coverage at [email or web address]     01/08/2022, 2:58 PM

## 2022-01-08 NOTE — Progress Notes (Signed)
Pharmacy Antibiotic Note  [name] [name] is a 46 y.o. male w/ ESRD on MWF HD admitted on 01/12/2022 with infection of unknown source.  Pharmacy has been consulted for Cefepime & Vancomycin   9/13: Day 3 antibiotics. Blood cultures + MSSE; s/p I&D 9/11R Hip joint. Cultures pending. Repeat blood cultures 9/13 sent  Plan: Continue Cefepime 1 gm q24hr per indication & renal fxn Continue Vancomycin 1000 mg IV MWF after dialysis Will get pre-HD Vanco random level prior to 2nd maintenance dose if warranted Follow up cultures for possible de-escalation   Pharmacy will continue to follow and will adjust abx dosing whenever warranted.   Temp (24hrs), Avg:98.2 F (36.8 C), Min:98.1 F (36.7 C), Max:98.3 F (36.8 C)   Recent Labs  Lab 12/28/2021 1621 01/08/2022 1821 01/04/2022 0628 01/07/22 0425 01/08/22 0420  WBC  --  14.4* 12.5* 14.3* 14.3*  CREATININE  --  10.26* 11.63* 8.27* 10.14*  LATICACIDVEN 1.3  --   --   --   --      Estimated Creatinine Clearance: 11.8 mL/min (A) (by C-G formula based on SCr of 10.14 mg/dL (H)).    Allergies  Allergen Reactions   Cephalexin Nausea Only   Ivp Dye [Iodinated Contrast Media] Diarrhea   Lisinopril Other (See Comments)    Pt cannot recall reaction, cough   Other     Antimicrobials this admission: 9/11 Cefepime >>  9/11 Vancomycin >>  Microbiology results: 9/10 BCx: MSSE 9/13: Bcx: sent 9/11: I&D wound cx: NGTD  Thank you for allowing pharmacy to be a part of this patient's care.  Dorothe Pea, PharmD, BCPS Clinical Pharmacist   01/08/2022 7:46 AM

## 2022-01-08 NOTE — Progress Notes (Signed)
PT did 3.5 hrs of HD Tolerated well with no issues or signs of distress  Unable to get good blood flow using the Left Upper AVG Had to use the HD cath to dialyze, pt didn't want to get cannulated again.  UF = 1000  1 unit of blood was given. Vancomycin was given Epogen was given  VS BP 123/74 Hr 88 Temp 98 O2sat 92  Cvc was heplocked and capped. Report given to floor R.N.

## 2022-01-09 DIAGNOSIS — M25451 Effusion, right hip: Secondary | ICD-10-CM | POA: Diagnosis not present

## 2022-01-09 DIAGNOSIS — R6520 Severe sepsis without septic shock: Secondary | ICD-10-CM

## 2022-01-09 DIAGNOSIS — G9341 Metabolic encephalopathy: Secondary | ICD-10-CM

## 2022-01-09 DIAGNOSIS — J9601 Acute respiratory failure with hypoxia: Secondary | ICD-10-CM

## 2022-01-09 LAB — CULTURE, BLOOD (ROUTINE X 2)
Special Requests: ADEQUATE
Special Requests: ADEQUATE

## 2022-01-09 LAB — ECHOCARDIOGRAM COMPLETE
Area-P 1/2: 3.98 cm2
Height: 72 in
S' Lateral: 2.4 cm
Weight: 4134.07 oz

## 2022-01-09 LAB — TYPE AND SCREEN
ABO/RH(D): O POS
Antibody Screen: NEGATIVE
Unit division: 0
Unit division: 0

## 2022-01-09 LAB — BPAM RBC
Blood Product Expiration Date: 202310162359
Blood Product Expiration Date: 202310162359
ISSUE DATE / TIME: 202309111032
ISSUE DATE / TIME: 202309131147
Unit Type and Rh: 5100
Unit Type and Rh: 5100

## 2022-01-09 LAB — GLUCOSE, CAPILLARY
Glucose-Capillary: 264 mg/dL — ABNORMAL HIGH (ref 70–99)
Glucose-Capillary: 233 mg/dL — ABNORMAL HIGH (ref 70–99)

## 2022-01-09 LAB — HEPATITIS B CORE ANTIBODY, TOTAL

## 2022-01-09 LAB — HEPATITIS B SURFACE ANTIBODY,QUALITATIVE

## 2022-01-09 LAB — HEPATITIS B SURFACE ANTIGEN

## 2022-01-10 DIAGNOSIS — G9341 Metabolic encephalopathy: Secondary | ICD-10-CM | POA: Diagnosis not present

## 2022-01-10 DIAGNOSIS — J9601 Acute respiratory failure with hypoxia: Secondary | ICD-10-CM | POA: Diagnosis not present

## 2022-01-10 DIAGNOSIS — A419 Sepsis, unspecified organism: Secondary | ICD-10-CM | POA: Diagnosis present

## 2022-01-11 LAB — AEROBIC/ANAEROBIC CULTURE W GRAM STAIN (SURGICAL/DEEP WOUND): Culture: NO GROWTH

## 2022-01-13 LAB — CULTURE, BLOOD (ROUTINE X 2)
Culture: NO GROWTH
Culture: NO GROWTH
Special Requests: ADEQUATE
Special Requests: ADEQUATE

## 2022-01-14 NOTE — Anesthesia Postprocedure Evaluation (Signed)
Anesthesia Post Note  Patient: [name] [name]  Procedure(s) Performed: INCISION AND DRAINAGE OF RIGHT HIP (Right: Hip)     Patient location during evaluation: PACU Anesthesia Type: General Level of consciousness: awake and alert Pain management: pain level controlled Vital Signs Assessment: post-procedure vital signs reviewed and stable Respiratory status: spontaneous breathing, nonlabored ventilation, respiratory function stable and patient connected to nasal cannula oxygen Cardiovascular status: blood pressure returned to baseline and stable Postop Assessment: no apparent nausea or vomiting Anesthetic complications: no Comments: Post op note written latently. Pt. Deceased. ja   No notable events documented.  Molli Barrows

## 2022-01-26 NOTE — Death Summary Note (Signed)
DEATH SUMMARY   Patient Details  Name: [name] [name] MRN: [id number] DOB: [birth year] PIR:JJOACZY, Ardis Rowan, NP Admission/Discharge Information   Admit Date:  01-16-2022  Date of Death: Date of Death: 01/20/2022  Time of Death: Time of Death: 0423  Length of Stay: 4   Principle Cause of death: Sepsis  Hospital Diagnoses: Principal Problem:   Severe sepsis ([place]) Active Problems:   Sepsis due to Staphylococcus epidermidis ([place])   Right hip joint effusion   Septic arthritis of hip ([place])   Staphylococcus epidermidis bacteremia   Severe anemia   Acute respiratory failure with hypoxia ([place])   Acute metabolic encephalopathy   Coronary artery disease   ESRD on hemodialysis ([place]) - M, W, F via LUE AV fistula   Hyponatremia   Hypertension   Type 2 diabetes mellitus with end-stage renal disease [facility])   Hospital Course:  [name] was a 46 y.o. male with medical history significant for chronic diastolic CHF coronary artery disease, type diabetes mellitus, end-stage renal disease on hemodialysis on Monday Wednesday Friday, GERD, hypertension and type 2 diabetes mellitus, who presented to the hospital because of right hip pain and difficulty ambulating.  He was febrile in the emergency room with a temperature of 101 F.     He was admitted to the hospital with severe sepsis.  He was also found to have right hip septic arthritis.  He was treated with empiric IV antibiotics.  He underwent open irrigation and debridement of septic right hip on 01/02/2022.  He was seen by the nephrologist for hemodialysis. He was transfused with packed red blood cells for severe anemia.  Hospital course was complicated by acute hypoxic respiratory failure and acute metabolic encephalopathy.  4 out of 4 blood culture bottles came back positive for Staphylococcus epidermidis.  ID specialist was consulted to assist with management.  It was suspected that Staph epidermidis could  be coming from right IJ  hemodialysis catheter that was placed in May 2023.  Patient's condition worsened.  He developed cardiopulmonary arrest.  He was emergently intubated and placed on mechanical ventilation.  Unfortunately, he could not be resuscitated and he expired on 01-20-2022 at 4:23 AM.      Procedures: Open irrigation and debridement of septic right hip  Consultations: Nephrologist, ID specialist, orthopedic surgeon  The results of significant diagnostics from this hospitalization (including imaging, microbiology, ancillary and laboratory) are listed below for reference.   Significant Diagnostic Studies: ECHOCARDIOGRAM COMPLETE  Result Date: January 20, 2022    ECHOCARDIOGRAM REPORT   Patient Name:   [name] [name] Date of Exam: 01/08/2022 Medical Rec #:  [id number]       Height:       72.0 in Accession #:    [id number]      Weight:       258.4 lb Date of Birth:  10/21/1975      BSA:          2.376 m Patient Age:    51 years        BP:           124/84 mmHg Patient Gender: M               HR:           91 bpm. Exam Location:  [facility] Procedure: 2D Echo, Cardiac Doppler and Color Doppler Indications:     R78.81 Bacteremia  History:         Patient has prior history of  Echocardiogram examinations, most                  recent 08/27/2021. CHF, CAD; Risk Factors:Diabetes and                  Hypertension. End stage renal disease.  Sonographer:     Wilford Sports Rodgers-Jones RDCS Referring Phys:  Tsosie Billing Diagnosing Phys: Ida Rogue MD IMPRESSIONS  1. Left ventricular ejection fraction, by estimation, is 50 to 55%. The left ventricle has low normal function. The left ventricle has no regional wall motion abnormalities. There is mild left ventricular hypertrophy. Left ventricular diastolic parameters are consistent with Grade I diastolic dysfunction (impaired relaxation).  2. Right ventricular systolic function is normal. The right ventricular size is normal. There is normal pulmonary artery systolic pressure. The  estimated right ventricular systolic pressure is 44.3 mmHg.  3. The mitral valve is normal in structure. Mild to moderate mitral valve regurgitation. No evidence of mitral stenosis.  4. Tricuspid valve regurgitation is mild to moderate.  5. The aortic valve has an indeterminant number of cusps. Aortic valve regurgitation is not visualized. No aortic stenosis is present.  6. The inferior vena cava is dilated in size with >50% respiratory variability, suggesting right atrial pressure of 8 mmHg.  7. No valve vegetation noted FINDINGS  Left Ventricle: Left ventricular ejection fraction, by estimation, is 50 to 55%. The left ventricle has low normal function. The left ventricle has no regional wall motion abnormalities. The left ventricular internal cavity size was normal in size. There is mild left ventricular hypertrophy. Left ventricular diastolic parameters are consistent with Grade I diastolic dysfunction (impaired relaxation). Right Ventricle: The right ventricular size is normal. No increase in right ventricular wall thickness. Right ventricular systolic function is normal. There is normal pulmonary artery systolic pressure. The tricuspid regurgitant velocity is 2.67 m/s, and  with an assumed right atrial pressure of 5 mmHg, the estimated right ventricular systolic pressure is 15.4 mmHg. Left Atrium: Left atrial size was normal in size. Right Atrium: Right atrial size was normal in size. Pericardium: There is no evidence of pericardial effusion. Mitral Valve: The mitral valve is normal in structure. There is mild calcification of the mitral valve leaflet(s). Mild to moderate mitral valve regurgitation. No evidence of mitral valve stenosis. Tricuspid Valve: The tricuspid valve is normal in structure. Tricuspid valve regurgitation is mild to moderate. No evidence of tricuspid stenosis. Aortic Valve: The aortic valve has an indeterminant number of cusps. Aortic valve regurgitation is not visualized. No aortic  stenosis is present. Pulmonic Valve: The pulmonic valve was normal in structure. Pulmonic valve regurgitation is not visualized. No evidence of pulmonic stenosis. Aorta: The aortic root is normal in size and structure. Venous: The inferior vena cava is dilated in size with greater than 50% respiratory variability, suggesting right atrial pressure of 8 mmHg. IAS/Shunts: No atrial level shunt detected by color flow Doppler.  LEFT VENTRICLE PLAX 2D LVIDd:         3.90 cm Diastology LVIDs:         2.40 cm LV e' medial:    6.36 cm/s LV PW:         1.20 cm LV E/e' medial:  10.8 LV IVS:        0.90 cm LV e' lateral:   9.21 cm/s                        LV E/e' lateral: 7.5  RIGHT VENTRICLE             IVC RV Basal diam:  4.70 cm     IVC diam: 2.10 cm RV S prime:     10.04 cm/s TAPSE (M-mode): 1.4 cm LEFT ATRIUM             Index        RIGHT ATRIUM           Index LA diam:        3.80 cm 1.60 cm/m   RA Area:     16.00 cm LA Vol (A2C):   58.7 ml 24.71 ml/m  RA Volume:   47.20 ml  19.87 ml/m LA Vol (A4C):   29.9 ml 12.59 ml/m LA Biplane Vol: 43.2 ml 18.19 ml/m  AORTIC VALVE LVOT Vmax:   99.17 cm/s LVOT Vmean:  61.367 cm/s LVOT VTI:    0.147 m  AORTA Ao Root diam: 3.40 cm MITRAL VALVE               TRICUSPID VALVE MV Area (PHT): 3.98 cm    TR Peak grad:   28.5 mmHg MV Decel Time: 191 msec    TR Vmax:        267.00 cm/s MV E velocity: 69.00 cm/s MV A velocity: 76.15 cm/s  SHUNTS MV E/A ratio:  0.91        Systemic VTI: 0.15 m Ida Rogue MD Electronically signed by Ida Rogue MD Signature Date/Time: 02/06/2022/1:36:11 PM    Final    MR HIP RIGHT WO CONTRAST  Result Date: 01/19/2022 CLINICAL DATA:  Right hip pain. Pain with movement. Patient has a fever. EXAM: MR OF THE RIGHT HIP WITHOUT CONTRAST TECHNIQUE: Multiplanar, multisequence MR imaging was performed. No intravenous contrast was administered. COMPARISON:  None Available. FINDINGS: Bones: No hip fracture, dislocation or avascular necrosis. Mild marrow  edema in the right medial femoral head and acetabulum. No periosteal reaction or bone destruction. No aggressive osseous lesion. Normal sacrum and sacroiliac joints. No SI joint widening or erosive changes. Articular cartilage and labrum Articular cartilage: High-grade partial-thickness cartilage loss of the right femoral head and acetabulum. Labrum: Grossly intact, but evaluation is limited by lack of intraarticular fluid. Joint or bursal effusion Joint effusion: Large right hip joint effusion. No left joint effusion. No SI joint effusion. Bursae:  No bursal fluid. Muscles and tendons Mild soft tissue edema in the right adductor musculature and right gluteus maximus muscle adjacent to the trochanteric insertion. Other findings No pelvic free fluid. No fluid collection or hematoma. No inguinal lymphadenopathy. No inguinal hernia. IMPRESSION: 1. High-grade partial-thickness cartilage loss of the right femoral head and acetabulum. Mild marrow edema in the right medial femoral head and acetabulum. Large right hip joint effusion. Mild soft tissue edema in the right adductor musculature and right gluteus maximus muscle adjacent to the trochanteric insertion. The overall appearance is concerning for septic arthritis. If there is clinical concern regarding septic arthritis, recommend arthrocentesis. These results will be called to the ordering clinician or representative by the Radiologist Assistant, and communication documented in the PACS or [employer]. Electronically Signed   By: Kathreen Devoid M.D.   On: 01/12/2022 07:39   DG Chest 2 View  Result Date: 01/04/2022 CLINICAL DATA:  Suspected sepsis. EXAM: CHEST - 2 VIEW COMPARISON:  Chest x-ray 10/26/2021 FINDINGS: Right-sided central venous catheter tip projects over the distal SVC. Left-sided central venous catheter tip projects over the cavoatrial junction. There is a vascular stent in  the left axillary region. The lungs are clear. There is no pleural effusion  or pneumothorax. The cardiomediastinal silhouette is within normal limits. No acute fractures. IMPRESSION: No active cardiopulmonary disease. Electronically Signed   By: Ronney Asters M.D.   On: 01/18/2022 18:17    Microbiology: Recent Results (from the past 240 hour(s))  Culture, blood (Routine x 2)     Status: Abnormal   Collection Time: 01/21/2022  6:31 PM   Specimen: Left Antecubital; Blood  Result Value Ref Range Status   Specimen Description   Final    LEFT ANTECUBITAL Performed at [facility], [address], [place], [name] [address]    Special Requests   Final    BOTTLES DRAWN AEROBIC AND ANAEROBIC Blood Culture adequate volume Performed at [facility], [address] [place], [place] [address]    Culture  Setup Time   Final    GRAM POSITIVE COCCI IN BOTH AEROBIC AND ANAEROBIC BOTTLES CRITICAL VALUE NOTED.  VALUE IS CONSISTENT WITH PREVIOUSLY REPORTED AND CALLED VALUE. DLB    Culture (A)  Final    STAPHYLOCOCCUS EPIDERMIDIS SUSCEPTIBILITIES PERFORMED ON PREVIOUS CULTURE WITHIN THE LAST 5 DAYS. Performed at [facility], [place] [address] [place], [place] [address]    Report Status February 05, 2022 FINAL  Final  Culture, blood (Routine x 2)     Status: Abnormal   Collection Time: 01/20/2022  6:31 PM   Specimen: BLOOD LEFT FOREARM  Result Value Ref Range Status   Specimen Description   Final    BLOOD LEFT FOREARM Performed at [facility], [address], [place], [place] [address]    Special Requests   Final    BOTTLES DRAWN AEROBIC AND ANAEROBIC Blood Culture adequate volume Performed at [facility], [address] [place], [place] [address]    Culture  Setup Time   Final    GRAM POSITIVE COCCI IN BOTH AEROBIC AND ANAEROBIC BOTTLES CRITICAL RESULT CALLED TO, READ BACK BY AND VERIFIED WITH: ALEX CHAPPELL AT 1627 ON 01/12/2022 BY SS    Culture STAPHYLOCOCCUS EPIDERMIDIS (A)  Final   Report Status 2022-02-05 FINAL  Final    Organism ID, Bacteria STAPHYLOCOCCUS EPIDERMIDIS  Final      Susceptibility   Staphylococcus epidermidis - MIC*    CIPROFLOXACIN <=0.5 SENSITIVE Sensitive     ERYTHROMYCIN <=0.25 SENSITIVE Sensitive     GENTAMICIN <=0.5 SENSITIVE Sensitive     OXACILLIN <=0.25 SENSITIVE Sensitive     TETRACYCLINE 2 SENSITIVE Sensitive     VANCOMYCIN 1 SENSITIVE Sensitive     TRIMETH/SULFA <=10 SENSITIVE Sensitive     CLINDAMYCIN <=0.25 SENSITIVE Sensitive     RIFAMPIN <=0.5 SENSITIVE Sensitive     Inducible Clindamycin NEGATIVE Sensitive     * STAPHYLOCOCCUS EPIDERMIDIS  Blood Culture ID Panel (Reflexed)     Status: Abnormal   Collection Time: 01/04/2022  6:31 PM  Result Value Ref Range Status   Enterococcus faecalis NOT DETECTED NOT DETECTED Final   Enterococcus Faecium NOT DETECTED NOT DETECTED Final   Listeria monocytogenes NOT DETECTED NOT DETECTED Final   Staphylococcus species DETECTED (A) NOT DETECTED Final    Comment: CRITICAL RESULT CALLED TO, READ BACK BY AND VERIFIED WITH: ALEX CHAPPELLE AT 3419 ON 01/22/2022 BY SS    Staphylococcus aureus (BCID) NOT DETECTED NOT DETECTED Final   Staphylococcus epidermidis DETECTED (A) NOT DETECTED Final    Comment: CRITICAL RESULT CALLED TO, READ BACK BY AND VERIFIED WITH: [place] AT 3790  ON 01/04/2022 BY SS    Staphylococcus lugdunensis NOT DETECTED NOT DETECTED Final   Streptococcus species NOT DETECTED NOT DETECTED Final   Streptococcus agalactiae NOT DETECTED NOT DETECTED Final   Streptococcus pneumoniae NOT DETECTED NOT DETECTED Final   Streptococcus pyogenes NOT DETECTED NOT DETECTED Final   A.calcoaceticus-baumannii NOT DETECTED NOT DETECTED Final   Bacteroides fragilis NOT DETECTED NOT DETECTED Final   Enterobacterales NOT DETECTED NOT DETECTED Final   Enterobacter cloacae complex NOT DETECTED NOT DETECTED Final   Escherichia coli NOT DETECTED NOT DETECTED Final   Klebsiella aerogenes NOT DETECTED NOT DETECTED Final   Klebsiella oxytoca NOT  DETECTED NOT DETECTED Final   Klebsiella pneumoniae NOT DETECTED NOT DETECTED Final   Proteus species NOT DETECTED NOT DETECTED Final   Salmonella species NOT DETECTED NOT DETECTED Final   Serratia marcescens NOT DETECTED NOT DETECTED Final   Haemophilus influenzae NOT DETECTED NOT DETECTED Final   Neisseria meningitidis NOT DETECTED NOT DETECTED Final   Pseudomonas aeruginosa NOT DETECTED NOT DETECTED Final   Stenotrophomonas maltophilia NOT DETECTED NOT DETECTED Final   Candida albicans NOT DETECTED NOT DETECTED Final   Candida auris NOT DETECTED NOT DETECTED Final   Candida glabrata NOT DETECTED NOT DETECTED Final   Candida krusei NOT DETECTED NOT DETECTED Final   Candida parapsilosis NOT DETECTED NOT DETECTED Final   Candida tropicalis NOT DETECTED NOT DETECTED Final   Cryptococcus neoformans/gattii NOT DETECTED NOT DETECTED Final   Methicillin resistance mecA/C NOT DETECTED NOT DETECTED Final    Comment: Performed at [facility], [address] [place], [place] [address]  SARS Coronavirus 2 by RT PCR (hospital order, performed in [place] hospital lab) *cepheid single result test* Anterior Nasal Swab     Status: None   Collection Time: 01/20/2022 10:20 PM   Specimen: Anterior Nasal Swab  Result Value Ref Range Status   SARS Coronavirus 2 by RT PCR NEGATIVE NEGATIVE Final    Comment: (NOTE) SARS-CoV-2 target nucleic acids are NOT DETECTED.  The SARS-CoV-2 RNA is generally detectable in upper and lower respiratory specimens during the acute phase of infection. The lowest concentration of SARS-CoV-2 viral copies this assay can detect is 250 copies / mL. A negative result does not preclude SARS-CoV-2 infection and should not be used as the sole basis for treatment or other patient management decisions.  A negative result may occur with improper specimen collection / handling, submission of specimen other than nasopharyngeal swab, presence of viral mutation(s) within  the areas targeted by this assay, and inadequate number of viral copies (<250 copies / mL). A negative result must be combined with clinical observations, patient history, and epidemiological information.  Fact Sheet for Patients:   https://www.patel.info/  Fact Sheet for Healthcare Providers: https://hall.com/  This test is not yet approved or  cleared by the [place] FDA and has been authorized for detection and/or diagnosis of SARS-CoV-2 by FDA under an Emergency Use Authorization (EUA).  This EUA will remain in effect (meaning this test can be used) for the duration of the COVID-19 declaration under Section 564(b)(1) of the Act, 21 U.S.C. section 360bbb-3(b)(1), unless the authorization is terminated or revoked sooner.  Performed at [facility], [address] [place], [place] [address]   Surgical pcr screen     Status: None   Collection Time: 01/21/2022  2:30 PM   Specimen: Nasal Mucosa; Nasal Swab  Result Value Ref Range Status   MRSA, PCR NEGATIVE NEGATIVE Final   Staphylococcus aureus  NEGATIVE NEGATIVE Final    Comment: (NOTE) The Xpert SA Assay (FDA approved for NASAL specimens in patients 39 years of age and older), is one component of a comprehensive surveillance program. It is not intended to diagnose infection nor to guide or monitor treatment. Performed at [facility], [address], [place], [place] [address]   Aerobic/Anaerobic Culture w Gram Stain (surgical/deep wound)     Status: None (Preliminary result)   Collection Time: 01/11/2022  6:45 PM   Specimen: PATH Other; Tissue  Result Value Ref Range Status   Specimen Description   Final    WOUND Performed at [facility], [address] [place], [place] [address]    Special Requests   Final    RIGHT HIP INCISION Performed at [facility], [address] [place], [place] [address]    Gram Stain   Final    MODERATE  WBC PRESENT, PREDOMINANTLY PMN NO ORGANISMS SEEN    Culture   Final    NO GROWTH 4 DAYS NO ANAEROBES ISOLATED; CULTURE IN PROGRESS FOR 5 DAYS Performed at [facility] [address], [place], [place] [address]    Report Status PENDING  Incomplete  Culture, blood (Routine X 2) w Reflex to ID Panel     Status: None (Preliminary result)   Collection Time: 01/08/22  4:20 AM   Specimen: BLOOD  Result Value Ref Range Status   Specimen Description BLOOD RIGHT ANTECUBITAL  Final   Special Requests   Final    BOTTLES DRAWN AEROBIC AND ANAEROBIC Blood Culture adequate volume   Culture   Final    NO GROWTH 2 DAYS Performed at [facility], [address] [place], [place] [address]    Report Status PENDING  Incomplete  Culture, blood (Routine X 2) w Reflex to ID Panel     Status: None (Preliminary result)   Collection Time: 01/08/22  4:25 AM   Specimen: BLOOD  Result Value Ref Range Status   Specimen Description BLOOD BLOOD RIGHT FOREARM  Final   Special Requests   Final    BOTTLES DRAWN AEROBIC AND ANAEROBIC Blood Culture adequate volume   Culture   Final    NO GROWTH 2 DAYS Performed at [facility], [address] [place],  [address]    Report Status PENDING  Incomplete      Signed: Jennye Boroughs, MD 2022/01/30

## 2022-01-26 NOTE — Progress Notes (Addendum)
NAME: [name] [name]  DOB: 1976/01/10  DOA: 01/07/2022 MRN: [id number]  Date/Time: 01-13-22 04:56 AM    Brief HPI: 46 y.o male with significant PMH of CHF, CAD, T2DM, ESRD on HD MWF, Anemia of chronic disease, and HTN who was admitted to hospital service with septic arthritis s/p ID, staph epidermidis bacteremia likely from right IJ HD.  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  Left arm shuntogram and central venogram 3.  Catheter directed thrombolysis with 4 mg of TPA  4.  Mechanical thrombectomy to the HeRO graft with the Fogarty catheter for the arterial anastomosis and the cleaner catheter for the remainder of the graft. 5.  Fogarty embolectomy for residual arterial plug 09/19/21: he had RT IJ HD cath placed and is getting dialysis thru that  09/26/21: He underwent   Left arm HeRO graft cannulation under ultrasound guidance and t Left arm shuntogram and was found to have a  widely patent left arm hero graft  Significant Hospital Events: 9/10: Admitted to medsurg unit with septic Arthritis secondary to right hip joint arthritis 9/11: Ortho consulted for right hip pain s/p open irrigation and debridement of septic right hip.surgical cultures obtained and sent. 9/12: Nephrology consulted to manage dialysis during admission 9/13: ID consulted for staph epidermidis bacteremia with recs to remove Right IJ HD and obtain TEE. 9/13:Rapid response called for syncopal episode, elevated BP. Hospitalist paged and ordered repeat labs, ABG, and transfer to progressive for closer monitoring, BP 139/97, HR 93, O2 sats 95% on 4L [place] 9/14: Code blue called on the floor for unresponsiveness. Patient had an episode of bradycardia and became unresponsive. Patient transferred to  the ICU. On arrival to the ICU, pt noted with agonal respirations, pale, diaphoretic and c/o  " I can't breath" prior to losing pulse. CPR initiated and patient immediately intubated for airway protection while CPR in progress.   CARDIOPULMONARY RESUSCITATION Initial rhythm: PEA CPR performance duration: 47 minutes Was defibrillation or cardioversion used ? No Was external pacer placed ? No Was patient intubated pre/post CPR ? Intubated during CPR Was transvenous pacer placed ? No  Medications Administered Include:      Yes/no Amiodarone yes  Atropine no  Calcium Yes x 2  Epinephrine Yes x 10  Lidocaine no  Magnesium no  Norepinephrine yes  Phenylephrine no  Sodium bicarbonate Yes x 4  Vasopression no   Evaluation Final Status - Was patient successfully resuscitated ? No If successfully resuscitated - what is current rhythm ? N/a If successfully resuscitated - what is current hemodynamic status ? N/a   Assessment & Plan:  PEA Cardiac Arrest Circulatory shock PMHx:CAD, CHF, HTN Patient initially successfully resuscitated with ROSC however he lost pulses again x 3. A quick bedside cardiac ultrasound POCUS revealed no cardiac wall motion. -New bundle branch block and changes in ECG, symptoms concerning for possible PE.  However pt too unstable for further evaluation,  Acute Hypoxic / Hypercapnic Respiratory Failure secondary in the setting of  pulmonary edema/ Aspiration Event PMHx: ESRD on HD, Last HD on 9/13 -Intubated with pink frothy sputum noted in the airway  Resuscitation efforts were terminated and patient pronounced dead at [place] after multiple attempts to resuscitate and review of ACLS H's and T's  -Hypovolemia  -  Hypoxia -Hydrogen Ion excess (acidosis) -Hypoglycemia -Hypokalemia / Hyperkalemia  -Hypothermia  -Tension pneumothorax  -Toxins  -Thrombosis PE / MI  Patient's mother notified and at the bedside with chaplain present.   Additional CC time 50  mins   Rufina Falco, DNP, CCRN, FNP-C, AGACNP-BC Acute Care & Family Nurse Practitioner  [facility]  See Amion for personal pager PCCM on call pager [phone] until 7 am

## 2022-01-26 NOTE — Progress Notes (Signed)
ETT moved @0427  per verbal orders from [facility]

## 2022-01-26 NOTE — Procedures (Signed)
Intubation Procedure Note  AMAAD BYERS  [id number]  18-Jan-1976  Date:January 10, 2022  Time:4:26 AM   Provider Performing:Iviona Hole A Amberlin Utke    Procedure: Intubation (31500)  Indication(s) Respiratory Failure  Consent Unable to obtain consent due to emergent nature of procedure.  Anesthesia None required patient in PEA Cardiac Arrest  Time Out Verified patient identification, verified procedure, site/side was marked, verified correct patient position, special equipment/implants available, medications/allergies/relevant history reviewed, required imaging and test results available.  Sterile Technique Usual hand hygeine, masks, and gloves were used  Procedure Description Patient positioned in bed supine.  Sedation given as noted above.  Patient was intubated with endotracheal tube using Glidescope.  View was Grade 1 full glottis .  Number of attempts was 1.  Colorimetric CO2 detector was consistent with tracheal placement.  Complications/Tolerance None; patient tolerated the procedure well. Chest X-ray is ordered to verify placement.   EBL Minimal  Specimen(s) None    Rufina Falco, DNP, CCRN, FNP-C, AGACNP-BC Acute Care & Family Nurse Practitioner  [facility]  See Amion for personal pager PCCM on call pager [phone] until 7 am

## 2022-01-26 NOTE — Progress Notes (Signed)
0330 Responded to a Code blue Page  Staff performing CPR on Patient upon entering the room. Patient was found apenic and unresponsive by primary nurse. One round of CPR and EPI given. Patient became alert with HR  in the 110s BP 200/115 97 on NRB. Patient transferred to ICU.   [phone]  Patient lost pulse. See code sheet   Time of death called by Rufina Falco NP at [phone]

## 2022-01-26 NOTE — ED Provider Notes (Signed)
Department of Emergency Medicine CODE BLUE CONSULTATION    Code Blue CONSULT NOTE  Chief Complaint: Cardiac arrest/unresponsive   Level V Caveat: Unresponsive  History of present illness: I was contacted by the hospital for a CODE BLUE cardiac arrest upstairs and presented to the patient's bedside.  Called for CODE BLUE.  Arrived to patient's bedside to find active CPR in progress.  Reportedly patient became bradycardic and lost pulses.  ROS: Unable to obtain, Level V caveat  Scheduled Meds:  sodium chloride   Intravenous Once   aspirin EC  81 mg Oral Daily   carvedilol  25 mg Oral BID   Chlorhexidine Gluconate Cloth  6 each Topical Q0600   cinacalcet  30 mg Oral Q breakfast   docusate sodium  100 mg Oral BID   epoetin (EPOGEN/PROCRIT) injection  4,000 Units Intravenous Q M,W,F-HD   heparin injection (subcutaneous)  5,000 Units Subcutaneous Q8H   insulin aspart  0-15 Units Subcutaneous TID AC & HS   insulin aspart protamine- aspart  15 Units Subcutaneous BID WC   losartan  50 mg Oral Daily   pantoprazole  40 mg Oral Daily   sevelamer carbonate  2.4 g Oral TID WC   vitamin A  20,000 Units Oral Daily   Vitamin E  800 Units Oral Daily   Continuous Infusions:  vancomycin Stopped (01/08/22 1735)   PRN Meds:.acetaminophen, bisacodyl, cyclobenzaprine, diphenhydrAMINE, HYDROmorphone (DILAUDID) injection, lidocaine, metoCLOPramide **OR** metoCLOPramide (REGLAN) injection, ondansetron **OR** ondansetron (ZOFRAN) IV, oxyCODONE, sodium phosphate Past Medical History:  Diagnosis Date   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   CHF (congestive heart failure) ([place])    Coronary artery disease    Diabetes mellitus without complication ([place])    Dialysis patient ([place])    M-W-F   GERD (gastroesophageal reflux disease)    Hypertension    Kyrle's disease    Renal disorder    on dialysis for 4 years three times a week last was Tuesday   Secondary hyperparathyroidism of renal origin  ([place])    Shortness of breath dyspnea    Type 2 diabetes mellitus with diabetic nephropathy (HCC)    Type 2 diabetes mellitus with diabetic retinopathy [facility])    Past Surgical History:  Procedure Laterality Date   A/V FISTULAGRAM Left 10/01/2017   Procedure: A/V FISTULAGRAM;  Surgeon: Algernon Huxley, MD;  Location: [place] [facility];  Service: Cardiovascular;  Laterality: Left;   A/V FISTULAGRAM Left 05/17/2020   Procedure: A/V FISTULAGRAM;  Surgeon: Algernon Huxley, MD;  Location: [place] [facility];  Service: Cardiovascular;  Laterality: Left;   A/V SHUNTOGRAM Left 04/16/2021   Procedure: A/V SHUNTOGRAM;  Surgeon: Katha Cabal, MD;  Location: [place] [facility];  Service: Cardiovascular;  Laterality: Left;   A/V SHUNTOGRAM Left 08/29/2021   Procedure: A/V SHUNTOGRAM;  Surgeon: Algernon Huxley, MD;  Location: [place] [facility];  Service: Cardiovascular;  Laterality: Left;   CATARACT EXTRACTION W/PHACO Right 06/30/2018   Procedure: CATARACT EXTRACTION PHACO AND INTRAOCULAR LENS PLACEMENT (IOC)-RIGHT;  Surgeon: Eulogio Bear, MD;  Location: [facility] ORS;  Service: Ophthalmology;  Laterality: Right;  [place] 00:07.0 CDE 0.16 FLUID PACK lOT # [id number]   CATARACT EXTRACTION W/PHACO Left 12/24/2018   Procedure: CATARACT EXTRACTION PHACO AND INTRAOCULAR LENS PLACEMENT (IOC);  Surgeon: Eulogio Bear, MD;  Location: [facility];  Service: Ophthalmology;  Laterality: Left;  [place] 00:30.0 CDE 2.01 FLUID PACK LOT # [id number]   COLONOSCOPY WITH PROPOFOL N/A 04/23/2017  Procedure: COLONOSCOPY WITH PROPOFOL;  Surgeon: [name], MD;  Location: [facility] ENDOSCOPY;  Service: Endoscopy;  Laterality: N/A;   COLONOSCOPY WITH PROPOFOL N/A 07/02/2017   Procedure: COLONOSCOPY WITH PROPOFOL;  Surgeon: [name], MD;  Location: [facility] ENDOSCOPY;  Service: Endoscopy;  Laterality: N/A;   DIALYSIS/PERMA CATHETER INSERTION N/A 09/27/2020   Procedure: DIALYSIS/PERMA CATHETER INSERTION;  Surgeon: Algernon Huxley, MD;   Location: [place] [facility];  Service: Cardiovascular;  Laterality: N/A;   DIALYSIS/PERMA CATHETER INSERTION N/A 09/19/2021   Procedure: DIALYSIS/PERMA CATHETER INSERTION;  Surgeon: Algernon Huxley, MD;  Location: [place] [facility];  Service: Cardiovascular;  Laterality: N/A;   DIALYSIS/PERMA CATHETER REMOVAL N/A 11/01/2020   Procedure: DIALYSIS/PERMA CATHETER REMOVAL;  Surgeon: Algernon Huxley, MD;  Location: [place] [facility];  Service: Cardiovascular;  Laterality: N/A;   ESOPHAGOGASTRODUODENOSCOPY (EGD) WITH PROPOFOL N/A 04/23/2017   Procedure: ESOPHAGOGASTRODUODENOSCOPY (EGD) WITH PROPOFOL;  Surgeon: [name], MD;  Location: [facility] ENDOSCOPY;  Service: Endoscopy;  Laterality: N/A;   ESOPHAGOGASTRODUODENOSCOPY (EGD) WITH PROPOFOL N/A 03/27/2020   Procedure: ESOPHAGOGASTRODUODENOSCOPY (EGD) WITH PROPOFOL;  Surgeon: Lesly Rubenstein, MD;  Location: [facility] ENDOSCOPY;  Service: Endoscopy;  Laterality: N/A;   EYE SURGERY     RETINA   INCISION AND DRAINAGE Right 01/02/2022   Procedure: INCISION AND DRAINAGE OF RIGHT HIP;  Surgeon: Corky Mull, MD;  Location: [facility];  Service: Orthopedics;  Laterality: Right;   INSERTION OF DIALYSIS CATHETER Right 08/09/2020   Procedure: INSERTION OF DIALYSIS CATHETER;  Surgeon: Algernon Huxley, MD;  Location: [facility];  Service: Vascular;  Laterality: Right;   LEFT HEART CATH AND CORONARY ANGIOGRAPHY N/A 08/27/2021   Procedure: LEFT HEART CATH AND CORONARY ANGIOGRAPHY;  Surgeon: Dionisio David, MD;  Location: [place] [facility];  Service: Cardiovascular;  Laterality: N/A;   PERIPHERAL VASCULAR CATHETERIZATION N/A 05/07/2015   Procedure: A/V Shuntogram/Fistulagram;  Surgeon: Katha Cabal, MD;  Location: [place] [facility];  Service: Cardiovascular;  Laterality: N/A;   PERIPHERAL VASCULAR CATHETERIZATION Left 05/07/2015   Procedure: A/V Shunt Intervention;  Surgeon: Katha Cabal, MD;  Location: [place] [facility];  Service: Cardiovascular;   Laterality: Left;   PERIPHERAL VASCULAR THROMBECTOMY Left 07/16/2020   Procedure: PERIPHERAL VASCULAR THROMBECTOMY;  Surgeon: Algernon Huxley, MD;  Location: [place] [facility];  Service: Cardiovascular;  Laterality: Left;   PERIPHERAL VASCULAR THROMBECTOMY Left 08/27/2020   Procedure: PERIPHERAL VASCULAR THROMBECTOMY;  Surgeon: Algernon Huxley, MD;  Location: [place] [facility];  Service: Cardiovascular;  Laterality: Left;   PERIPHERAL VASCULAR THROMBECTOMY N/A 09/26/2021   Procedure: PERIPHERAL VASCULAR THROMBECTOMY;  Surgeon: Algernon Huxley, MD;  Location: [place] [facility];  Service: Cardiovascular;  Laterality: N/A;   REVISON OF ARTERIOVENOUS FISTULA Left 08/09/2020   Procedure: REVISON OF ARTERIOVENOUS FISTULA;  Surgeon: Algernon Huxley, MD;  Location: [facility];  Service: Vascular;  Laterality: Left;   TEE WITHOUT CARDIOVERSION N/A 07/05/2015   Procedure: TRANSESOPHAGEAL ECHOCARDIOGRAM (TEE);  Surgeon: Dionisio David, MD;  Location: [facility];  Service: Cardiovascular;  Laterality: N/A;   UPPER EXTREMITY ANGIOGRAPHY Left 06/24/2018   Procedure: UPPER EXTREMITY ANGIOGRAPHY;  Surgeon: Algernon Huxley, MD;  Location: [place] [facility];  Service: Cardiovascular;  Laterality: Left;   VASCULAR ACCESS DEVICE INSERTION Left 07/11/2021   Procedure: INSERTION OF HERO VASCULAR ACCESS DEVICE;  Surgeon: Algernon Huxley, MD;  Location: [facility];  Service: Vascular;  Laterality: Left;   WOUND DEBRIDEMENT N/A 09/25/2020  Procedure: DEBRIDEMENT WOUND;  Surgeon: Katha Cabal, MD;  Location: [facility];  Service: Vascular;  Laterality: N/A;   Social History   Socioeconomic History   Marital status: Single    Spouse name: Not on file   Number of children: Not on file   Years of education: Not on file   Highest education level: Not on file  Occupational History   Not on file  Tobacco Use   Smoking status: Never   Smokeless tobacco: Never  Vaping Use   Vaping Use: Never used  Substance and Sexual Activity    Alcohol use: Not Currently    Comment: occ. shot   Drug use: No   Sexual activity: Not on file  Other Topics Concern   Not on file  Social History Narrative   Lives by himself   Social Determinants of Health   Financial Resource Strain: Not on file  Food Insecurity: Unknown (01/02/2022)   Hunger Vital Sign    Worried About Running Out of Food in the Last Year: Patient refused    [place] in the Last Year: Patient refused  Transportation Needs: Unknown (01/23/2022)   PRAPARE - Hydrologist (Medical): Patient refused    Lack of Transportation (Non-Medical): Patient refused  Physical Activity: Not on file  Stress: Not on file  Social Connections: Not on file  Intimate Partner Violence: Unknown (01/23/2022)   Humiliation, Afraid, Rape, and Kick questionnaire    Fear of Current or Ex-Partner: Patient refused    Emotionally Abused: Patient refused    Physically Abused: Patient refused    Sexually Abused: Patient refused   Allergies  Allergen Reactions   Cephalexin Nausea Only   Ivp Dye [Iodinated Contrast Media] Diarrhea   Lisinopril Other (See Comments)    Pt cannot recall reaction, cough   Other     Last set of Vital Signs (not current) Vitals:   01/08/22 2008 01/08/22 2337  BP: 124/84 102/78  Pulse: 92 84  Resp: 20 20  Temp: 98.9 F (37.2 C) 99.3 F (37.4 C)  SpO2: 96% 95%      Physical Exam  Gen: unresponsive Cardiovascular: pulseless: Active chest compressions Resp: apneic. Breath sounds equal bilaterally with bagging  Abd: nondistended  Neuro: GCS 3, unresponsive to pain  HEENT: No blood in posterior pharynx, gag reflex absent  Neck: No crepitus  Musculoskeletal: No deformity  Skin: warm    CRITICAL CARE Performed by: Paulette Blanch Total critical care time: 10 minutes Critical care time was exclusive of separately billable procedures and treating other patients. Critical care was necessary to treat or prevent imminent  or life-threatening deterioration. Critical care was time spent personally by me on the following activities: development of treatment plan with patient and/or surrogate as well as nursing, discussions with consultants, evaluation of patient's response to treatment, examination of patient, obtaining history from patient or surrogate, ordering and performing treatments and interventions, ordering and review of laboratory studies, ordering and review of radiographic studies, pulse oximetry and re-evaluation of patient's condition.  Equipment ready to intubate patient when he regained pulses and woke up, opened his eyes and responded to questions.  Patient will be transported to CCU for further and probable intubation.   Paulette Blanch, MD 01-29-2022 [phone]

## 2022-01-26 NOTE — Progress Notes (Signed)
Notified by CCMD of pt HR dropping into the 30s. Upon assessment, pt noted to be unresponsive and apneic breathing. CPR initiated and Code Blue called. ROSC obtained after 1 round of epinephrine. Pt moved to ICU for observation and potential intubation.   Earleen Reaper, RN

## 2022-01-26 NOTE — Progress Notes (Signed)
              [id number] COLEGROVE            [id number]  DOB: 1975/09/25   Cross Cover Patient with code blue event - per primary nurse, was called by tele reporting brady rhythm. Nurse stated she went to room, he did not respond to stimulation so she immediately started CPR without puls check Patient aroused after 1 mg epi Transferred to ICU - was unable to do full assessment - per ICU team, patient needed intubated - ICU team to assume care  Kathlene Cote NP [facility]

## 2022-01-26 DEATH — deceased

## 2022-03-04 ENCOUNTER — Ambulatory Visit: Admit: 2022-03-04 | Payer: Medicare Other

## 2022-03-04 SURGERY — ESOPHAGOGASTRODUODENOSCOPY (EGD) WITH PROPOFOL
Anesthesia: General

## 2022-03-22 IMAGING — CR DG SHOULDER 2+V*R*
3 series · 3 of 3 positions shown · non-contrast
Comparison: None.

CLINICAL DATA: 44-year-old male with right shoulder pain.

EXAM:
RIGHT SHOULDER - 2+ VIEW

[shoulder grashey]
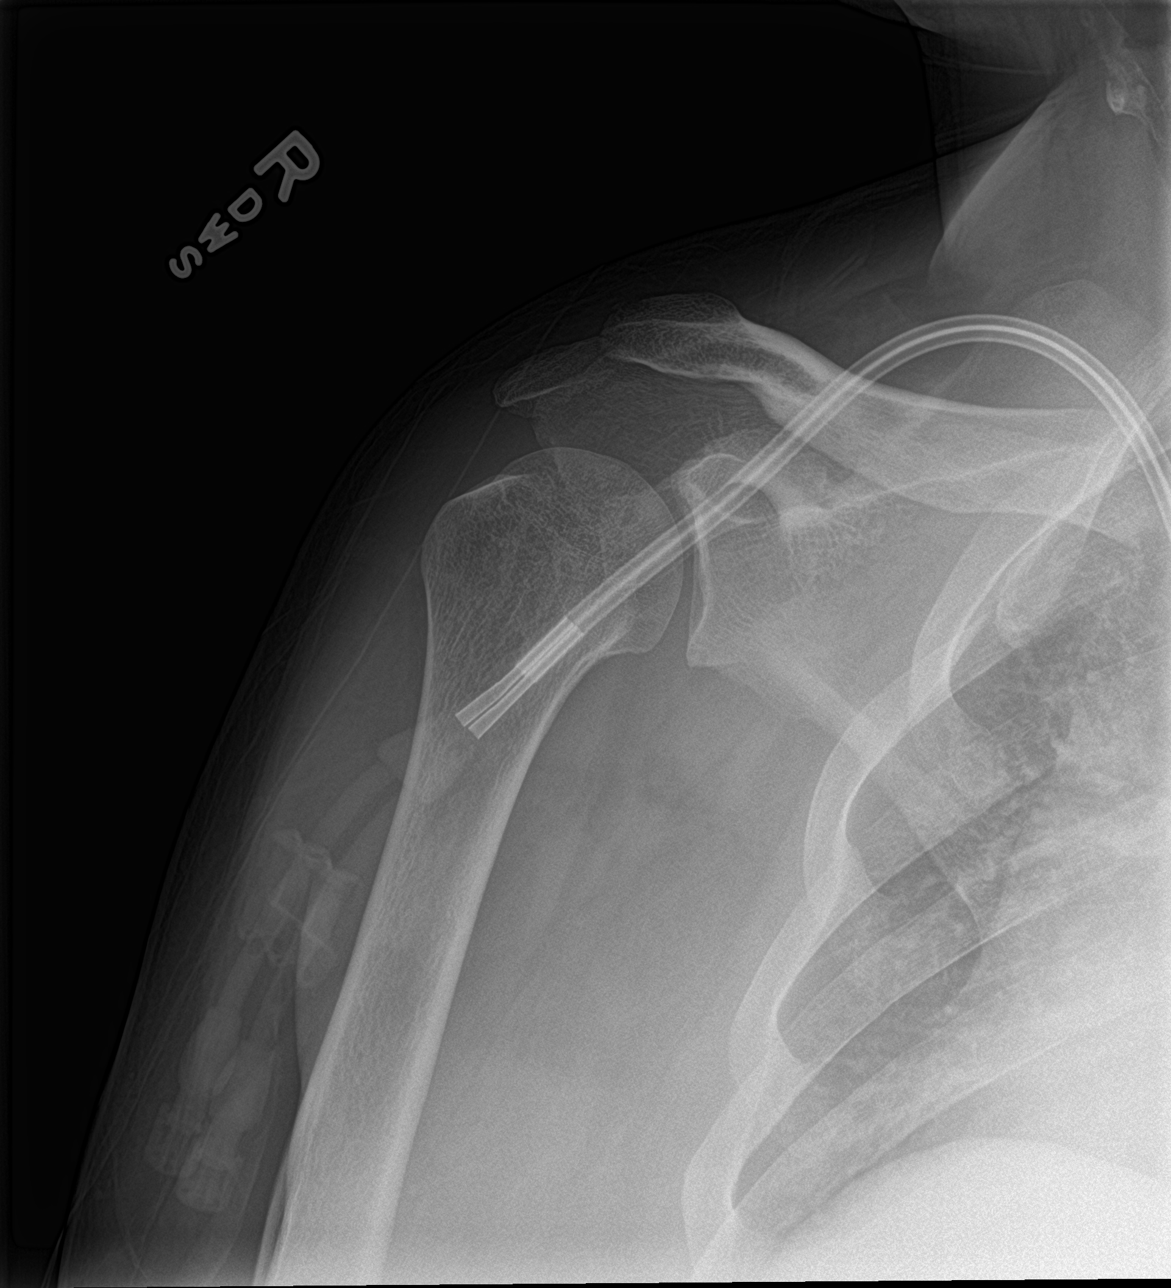

[shoulder y view]
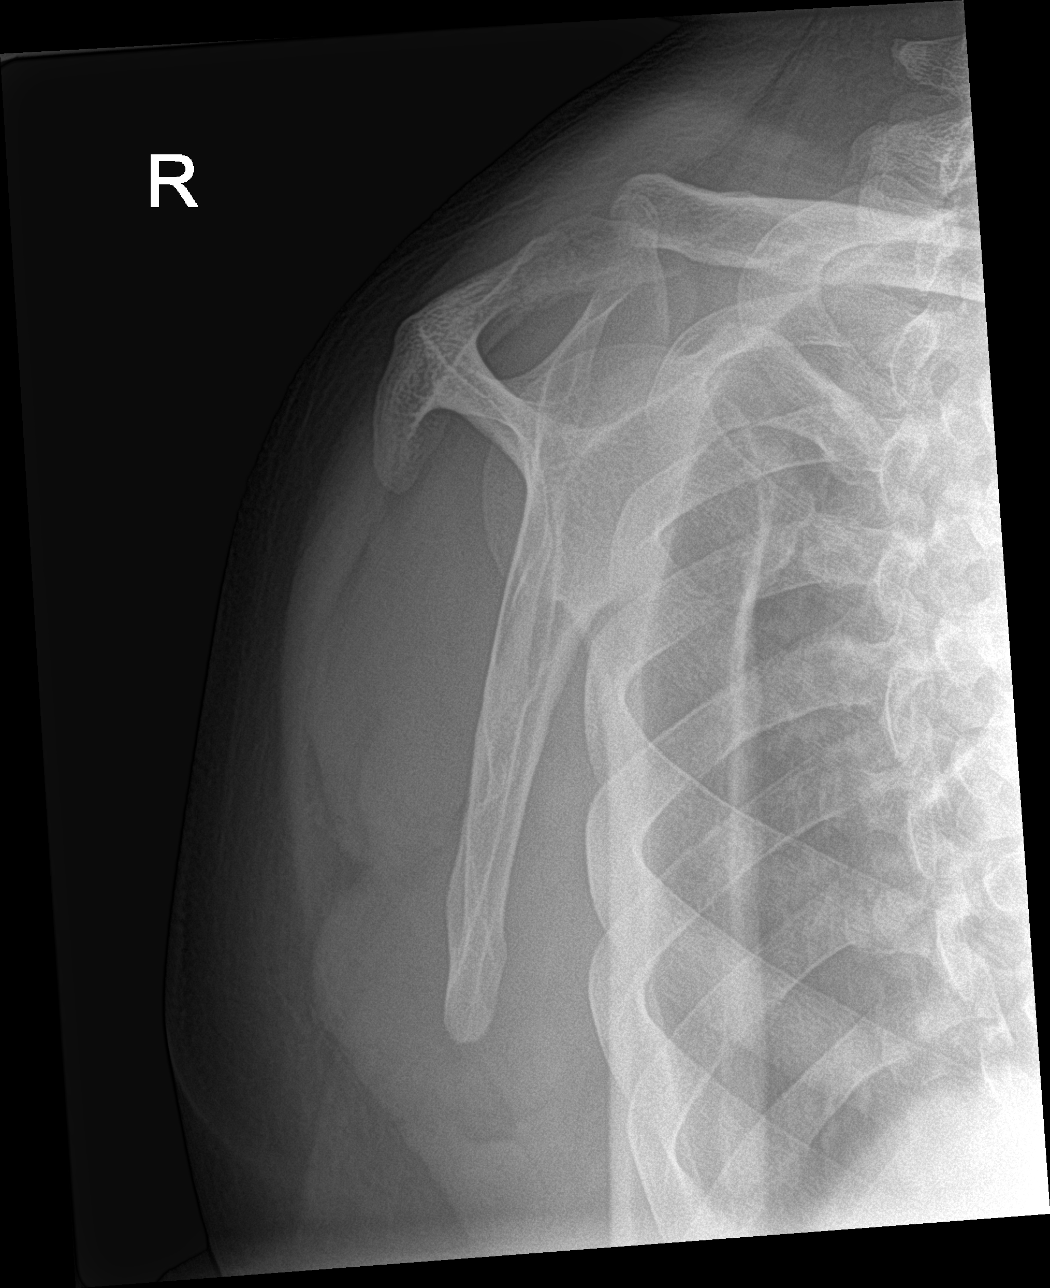

[shoulder axillary]
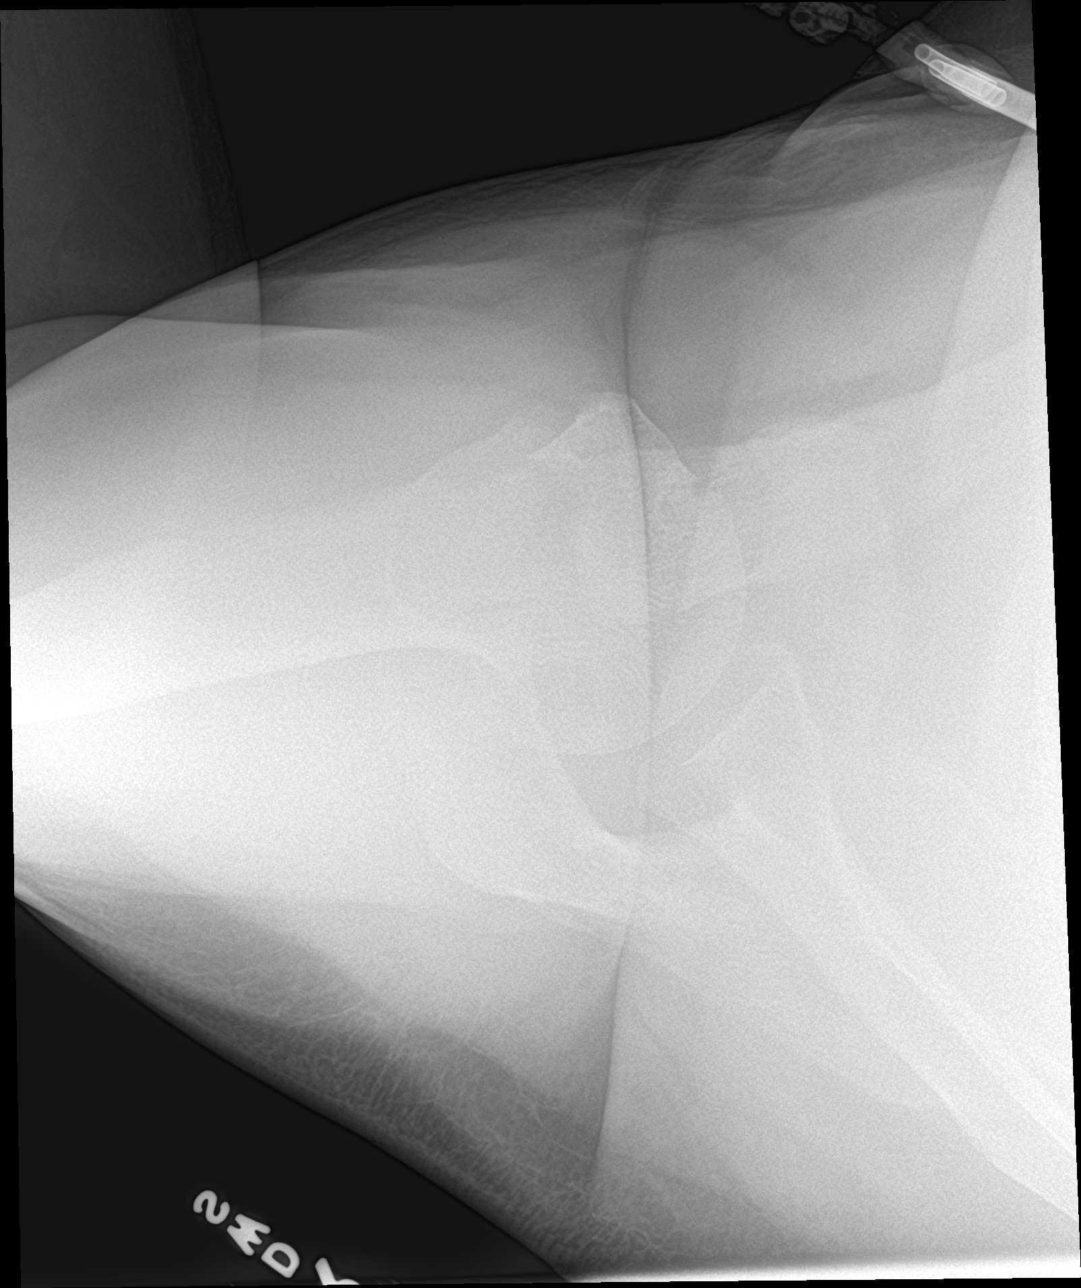

[3 of 3 positions shown; findings below may reference images not displayed]

FINDINGS: The indwelling tunneled hemodialysis catheter overlies the right
shoulder on the AP projection. There is no evidence of fracture or
dislocation. There is no evidence of arthropathy or other focal bone
abnormality. Soft tissues are unremarkable.
IMPRESSION: No acute fracture, malalignment, or significant arthropathy.

## 2022-04-01 ENCOUNTER — Encounter (INDEPENDENT_AMBULATORY_CARE_PROVIDER_SITE_OTHER): Payer: Self-pay

## 2022-04-01 ENCOUNTER — Ambulatory Visit (INDEPENDENT_AMBULATORY_CARE_PROVIDER_SITE_OTHER): Payer: Medicare Other | Admitting: Nurse Practitioner

## 2022-04-01 ENCOUNTER — Encounter (INDEPENDENT_AMBULATORY_CARE_PROVIDER_SITE_OTHER): Payer: Medicare Other
